# Patient Record
Sex: Male | Born: 1965 | Race: Black or African American | Hispanic: No | Smoking: Current every day smoker
Health system: Southern US, Community
[De-identification: ages and names within clinical notes are randomized; demographics above are authoritative.]

## PROBLEM LIST (undated history)

## (undated) ENCOUNTER — Emergency Department (HOSPITAL_COMMUNITY): Admission: EM | Source: Home / Self Care

## (undated) DIAGNOSIS — E119 Type 2 diabetes mellitus without complications: Secondary | ICD-10-CM

## (undated) DIAGNOSIS — F419 Anxiety disorder, unspecified: Secondary | ICD-10-CM

## (undated) DIAGNOSIS — Z8719 Personal history of other diseases of the digestive system: Secondary | ICD-10-CM

## (undated) DIAGNOSIS — G43909 Migraine, unspecified, not intractable, without status migrainosus: Secondary | ICD-10-CM

## (undated) DIAGNOSIS — I509 Heart failure, unspecified: Secondary | ICD-10-CM

## (undated) DIAGNOSIS — Z8711 Personal history of peptic ulcer disease: Secondary | ICD-10-CM

## (undated) DIAGNOSIS — F319 Bipolar disorder, unspecified: Secondary | ICD-10-CM

## (undated) DIAGNOSIS — F329 Major depressive disorder, single episode, unspecified: Secondary | ICD-10-CM

## (undated) DIAGNOSIS — G4730 Sleep apnea, unspecified: Secondary | ICD-10-CM

## (undated) DIAGNOSIS — K219 Gastro-esophageal reflux disease without esophagitis: Secondary | ICD-10-CM

## (undated) DIAGNOSIS — F32A Depression, unspecified: Secondary | ICD-10-CM

## (undated) DIAGNOSIS — Z9289 Personal history of other medical treatment: Secondary | ICD-10-CM

## (undated) DIAGNOSIS — K625 Hemorrhage of anus and rectum: Secondary | ICD-10-CM

## (undated) DIAGNOSIS — M1990 Unspecified osteoarthritis, unspecified site: Secondary | ICD-10-CM

## (undated) DIAGNOSIS — F99 Mental disorder, not otherwise specified: Secondary | ICD-10-CM

## (undated) DIAGNOSIS — R51 Headache: Secondary | ICD-10-CM

## (undated) DIAGNOSIS — K859 Acute pancreatitis without necrosis or infection, unspecified: Secondary | ICD-10-CM

## (undated) DIAGNOSIS — I10 Essential (primary) hypertension: Secondary | ICD-10-CM

## (undated) DIAGNOSIS — K922 Gastrointestinal hemorrhage, unspecified: Secondary | ICD-10-CM

## (undated) DIAGNOSIS — F10239 Alcohol dependence with withdrawal, unspecified: Secondary | ICD-10-CM

## (undated) HISTORY — PX: CARDIAC CATHETERIZATION: SHX172

## (undated) HISTORY — PX: NASAL POLYP EXCISION: SHX2068

## (undated) HISTORY — PX: COLONOSCOPY: SHX174

## (undated) HISTORY — PX: FRACTURE SURGERY: SHX138

---

## 1986-10-20 HISTORY — PX: EYE SURGERY: SHX253

## 1987-09-20 HISTORY — PX: ELBOW FRACTURE SURGERY: SHX616

## 1998-09-04 ENCOUNTER — Emergency Department (HOSPITAL_COMMUNITY): Admission: EM | Admit: 1998-09-04 | Discharge: 1998-09-04 | Payer: Self-pay | Admitting: Internal Medicine

## 1998-09-04 ENCOUNTER — Encounter: Payer: Self-pay | Admitting: Internal Medicine

## 1998-10-03 ENCOUNTER — Encounter: Payer: Self-pay | Admitting: Emergency Medicine

## 1998-10-03 ENCOUNTER — Emergency Department (HOSPITAL_COMMUNITY): Admission: EM | Admit: 1998-10-03 | Discharge: 1998-10-03 | Payer: Self-pay | Admitting: Emergency Medicine

## 1998-11-14 ENCOUNTER — Emergency Department (HOSPITAL_COMMUNITY): Admission: EM | Admit: 1998-11-14 | Discharge: 1998-11-14 | Payer: Self-pay | Admitting: Emergency Medicine

## 1999-11-21 HISTORY — PX: DIGITAL NERVE REPAIR: SHX1458

## 1999-11-26 ENCOUNTER — Emergency Department (HOSPITAL_COMMUNITY): Admission: EM | Admit: 1999-11-26 | Discharge: 1999-11-26 | Payer: Self-pay | Admitting: Emergency Medicine

## 1999-11-28 ENCOUNTER — Ambulatory Visit (HOSPITAL_BASED_OUTPATIENT_CLINIC_OR_DEPARTMENT_OTHER): Admission: RE | Admit: 1999-11-28 | Discharge: 1999-11-28 | Payer: Self-pay | Admitting: Orthopedic Surgery

## 2000-06-16 ENCOUNTER — Encounter: Payer: Self-pay | Admitting: Emergency Medicine

## 2000-06-16 ENCOUNTER — Inpatient Hospital Stay (HOSPITAL_COMMUNITY): Admission: EM | Admit: 2000-06-16 | Discharge: 2000-06-18 | Payer: Self-pay | Admitting: Emergency Medicine

## 2000-06-17 ENCOUNTER — Encounter: Payer: Self-pay | Admitting: *Deleted

## 2000-08-28 ENCOUNTER — Observation Stay (HOSPITAL_COMMUNITY): Admission: AD | Admit: 2000-08-28 | Discharge: 2000-08-29 | Payer: Self-pay | Admitting: Emergency Medicine

## 2000-08-28 ENCOUNTER — Encounter: Payer: Self-pay | Admitting: Emergency Medicine

## 2000-08-29 ENCOUNTER — Encounter: Payer: Self-pay | Admitting: Emergency Medicine

## 2001-02-17 ENCOUNTER — Emergency Department (HOSPITAL_COMMUNITY): Admission: EM | Admit: 2001-02-17 | Discharge: 2001-02-17 | Payer: Self-pay | Admitting: Emergency Medicine

## 2001-02-17 ENCOUNTER — Encounter: Payer: Self-pay | Admitting: Emergency Medicine

## 2001-08-22 ENCOUNTER — Emergency Department (HOSPITAL_COMMUNITY): Admission: EM | Admit: 2001-08-22 | Discharge: 2001-08-22 | Payer: Self-pay | Admitting: *Deleted

## 2001-11-22 ENCOUNTER — Emergency Department (HOSPITAL_COMMUNITY): Admission: EM | Admit: 2001-11-22 | Discharge: 2001-11-22 | Payer: Self-pay | Admitting: Emergency Medicine

## 2001-11-22 ENCOUNTER — Encounter: Payer: Self-pay | Admitting: Emergency Medicine

## 2002-03-22 ENCOUNTER — Emergency Department (HOSPITAL_COMMUNITY): Admission: EM | Admit: 2002-03-22 | Discharge: 2002-03-22 | Payer: Self-pay | Admitting: *Deleted

## 2002-06-28 ENCOUNTER — Encounter: Payer: Self-pay | Admitting: Emergency Medicine

## 2002-06-29 ENCOUNTER — Inpatient Hospital Stay (HOSPITAL_COMMUNITY): Admission: EM | Admit: 2002-06-29 | Discharge: 2002-07-01 | Payer: Self-pay | Admitting: Emergency Medicine

## 2002-06-30 ENCOUNTER — Encounter: Payer: Self-pay | Admitting: *Deleted

## 2002-07-02 ENCOUNTER — Emergency Department (HOSPITAL_COMMUNITY): Admission: EM | Admit: 2002-07-02 | Discharge: 2002-07-02 | Payer: Self-pay | Admitting: Emergency Medicine

## 2002-07-07 ENCOUNTER — Emergency Department (HOSPITAL_COMMUNITY): Admission: EM | Admit: 2002-07-07 | Discharge: 2002-07-07 | Payer: Self-pay | Admitting: *Deleted

## 2002-07-16 ENCOUNTER — Emergency Department (HOSPITAL_COMMUNITY): Admission: EM | Admit: 2002-07-16 | Discharge: 2002-07-16 | Payer: Self-pay | Admitting: Emergency Medicine

## 2002-10-25 ENCOUNTER — Emergency Department (HOSPITAL_COMMUNITY): Admission: EM | Admit: 2002-10-25 | Discharge: 2002-10-25 | Payer: Self-pay | Admitting: Emergency Medicine

## 2003-01-20 ENCOUNTER — Emergency Department (HOSPITAL_COMMUNITY): Admission: EM | Admit: 2003-01-20 | Discharge: 2003-01-20 | Payer: Self-pay | Admitting: Emergency Medicine

## 2003-01-20 ENCOUNTER — Encounter: Payer: Self-pay | Admitting: Emergency Medicine

## 2003-01-27 ENCOUNTER — Emergency Department (HOSPITAL_COMMUNITY): Admission: EM | Admit: 2003-01-27 | Discharge: 2003-01-27 | Payer: Self-pay | Admitting: Emergency Medicine

## 2003-09-16 ENCOUNTER — Emergency Department (HOSPITAL_COMMUNITY): Admission: EM | Admit: 2003-09-16 | Discharge: 2003-09-17 | Payer: Self-pay | Admitting: Emergency Medicine

## 2003-09-28 ENCOUNTER — Emergency Department (HOSPITAL_COMMUNITY): Admission: EM | Admit: 2003-09-28 | Discharge: 2003-09-28 | Payer: Self-pay | Admitting: Emergency Medicine

## 2003-10-01 ENCOUNTER — Emergency Department (HOSPITAL_COMMUNITY): Admission: EM | Admit: 2003-10-01 | Discharge: 2003-10-01 | Payer: Self-pay | Admitting: Emergency Medicine

## 2004-08-29 ENCOUNTER — Emergency Department (HOSPITAL_COMMUNITY): Admission: EM | Admit: 2004-08-29 | Discharge: 2004-08-29 | Payer: Self-pay | Admitting: Family Medicine

## 2004-10-04 ENCOUNTER — Emergency Department (HOSPITAL_COMMUNITY): Admission: EM | Admit: 2004-10-04 | Discharge: 2004-10-04 | Payer: Self-pay | Admitting: Family Medicine

## 2004-11-12 ENCOUNTER — Emergency Department (HOSPITAL_COMMUNITY): Admission: EM | Admit: 2004-11-12 | Discharge: 2004-11-12 | Payer: Self-pay | Admitting: Family Medicine

## 2004-12-08 ENCOUNTER — Emergency Department (HOSPITAL_COMMUNITY): Admission: EM | Admit: 2004-12-08 | Discharge: 2004-12-08 | Payer: Self-pay | Admitting: Emergency Medicine

## 2005-03-08 ENCOUNTER — Emergency Department (HOSPITAL_COMMUNITY): Admission: EM | Admit: 2005-03-08 | Discharge: 2005-03-09 | Payer: Self-pay | Admitting: Emergency Medicine

## 2005-03-27 ENCOUNTER — Emergency Department (HOSPITAL_COMMUNITY): Admission: EM | Admit: 2005-03-27 | Discharge: 2005-03-27 | Payer: Self-pay | Admitting: Emergency Medicine

## 2005-04-28 ENCOUNTER — Emergency Department (HOSPITAL_COMMUNITY): Admission: EM | Admit: 2005-04-28 | Discharge: 2005-04-28 | Payer: Self-pay | Admitting: Emergency Medicine

## 2005-07-20 ENCOUNTER — Emergency Department (HOSPITAL_COMMUNITY): Admission: EM | Admit: 2005-07-20 | Discharge: 2005-07-20 | Payer: Self-pay | Admitting: Emergency Medicine

## 2005-08-19 ENCOUNTER — Emergency Department (HOSPITAL_COMMUNITY): Admission: EM | Admit: 2005-08-19 | Discharge: 2005-08-19 | Payer: Self-pay | Admitting: Emergency Medicine

## 2005-09-23 ENCOUNTER — Emergency Department (HOSPITAL_COMMUNITY): Admission: EM | Admit: 2005-09-23 | Discharge: 2005-09-23 | Payer: Self-pay | Admitting: Emergency Medicine

## 2006-03-13 ENCOUNTER — Emergency Department (HOSPITAL_COMMUNITY): Admission: EM | Admit: 2006-03-13 | Discharge: 2006-03-13 | Payer: Self-pay | Admitting: Family Medicine

## 2006-03-14 ENCOUNTER — Emergency Department (HOSPITAL_COMMUNITY): Admission: EM | Admit: 2006-03-14 | Discharge: 2006-03-15 | Payer: Self-pay | Admitting: Emergency Medicine

## 2006-04-07 ENCOUNTER — Emergency Department (HOSPITAL_COMMUNITY): Admission: EM | Admit: 2006-04-07 | Discharge: 2006-04-07 | Payer: Self-pay | Admitting: Emergency Medicine

## 2006-04-29 ENCOUNTER — Emergency Department (HOSPITAL_COMMUNITY): Admission: EM | Admit: 2006-04-29 | Discharge: 2006-04-29 | Payer: Self-pay | Admitting: Family Medicine

## 2006-06-20 HISTORY — PX: CIRCUMCISION: SUR203

## 2006-06-26 ENCOUNTER — Ambulatory Visit (HOSPITAL_BASED_OUTPATIENT_CLINIC_OR_DEPARTMENT_OTHER): Admission: RE | Admit: 2006-06-26 | Discharge: 2006-06-26 | Payer: Self-pay | Admitting: Urology

## 2006-08-12 ENCOUNTER — Emergency Department (HOSPITAL_COMMUNITY): Admission: EM | Admit: 2006-08-12 | Discharge: 2006-08-12 | Payer: Self-pay | Admitting: Emergency Medicine

## 2006-12-03 ENCOUNTER — Emergency Department (HOSPITAL_COMMUNITY): Admission: EM | Admit: 2006-12-03 | Discharge: 2006-12-03 | Payer: Self-pay | Admitting: Emergency Medicine

## 2007-03-28 ENCOUNTER — Emergency Department (HOSPITAL_COMMUNITY): Admission: EM | Admit: 2007-03-28 | Discharge: 2007-03-28 | Payer: Self-pay | Admitting: Emergency Medicine

## 2009-03-21 ENCOUNTER — Emergency Department (HOSPITAL_COMMUNITY): Admission: EM | Admit: 2009-03-21 | Discharge: 2009-03-21 | Payer: Self-pay | Admitting: Emergency Medicine

## 2009-07-29 ENCOUNTER — Emergency Department (HOSPITAL_COMMUNITY): Admission: EM | Admit: 2009-07-29 | Discharge: 2009-07-29 | Payer: Self-pay | Admitting: Emergency Medicine

## 2009-11-17 ENCOUNTER — Emergency Department (HOSPITAL_COMMUNITY): Admission: EM | Admit: 2009-11-17 | Discharge: 2009-11-18 | Payer: Self-pay | Admitting: Emergency Medicine

## 2010-02-13 ENCOUNTER — Emergency Department (HOSPITAL_COMMUNITY): Admission: EM | Admit: 2010-02-13 | Discharge: 2010-02-13 | Payer: Self-pay | Admitting: Family Medicine

## 2010-05-14 ENCOUNTER — Emergency Department (HOSPITAL_COMMUNITY): Admission: EM | Admit: 2010-05-14 | Discharge: 2010-05-14 | Payer: Self-pay | Admitting: Emergency Medicine

## 2011-01-06 LAB — COMPREHENSIVE METABOLIC PANEL
ALT: 25 U/L (ref 0–53)
AST: 33 U/L (ref 0–37)
Albumin: 4.1 g/dL (ref 3.5–5.2)
Alkaline Phosphatase: 57 U/L (ref 39–117)
BUN: 10 mg/dL (ref 6–23)
CO2: 24 mEq/L (ref 19–32)
Calcium: 8.4 mg/dL (ref 8.4–10.5)
Chloride: 97 mEq/L (ref 96–112)
Creatinine, Ser: 0.96 mg/dL (ref 0.4–1.5)
GFR calc Af Amer: >60 mL/min (ref >60)
GFR calc non Af Amer: >60 mL/min (ref >60)
Glucose, Bld: 99 mg/dL (ref 70–99)
Potassium: 2.8 mEq/L — ABNORMAL LOW (ref 3.5–5.1)
Sodium: 130 mEq/L — ABNORMAL LOW (ref 135–145)
Total Bilirubin: 0.8 mg/dL (ref 0.3–1.2)
Total Protein: 7.6 g/dL (ref 6.0–8.3)

## 2011-01-06 LAB — CBC
HCT: 44.3 % (ref 39.0–52.0)
Hemoglobin: 15.3 g/dL (ref 13.0–17.0)
MCHC: 34.4 g/dL (ref 30.0–36.0)
MCV: 98.5 fL (ref 78.0–100.0)
Platelets: 185 10*3/uL (ref 150–400)
RBC: 4.5 MIL/uL (ref 4.22–5.81)
RDW: 13.1 % (ref 11.5–15.5)
WBC: 6.1 10*3/uL (ref 4.0–10.5)

## 2011-01-06 LAB — URINALYSIS, ROUTINE W REFLEX MICROSCOPIC
Bilirubin Urine: NEGATIVE
Glucose, UA: NEGATIVE mg/dL
Hgb urine dipstick: NEGATIVE
Ketones, ur: NEGATIVE mg/dL
Nitrite: NEGATIVE
Protein, ur: NEGATIVE mg/dL
Specific Gravity, Urine: 1.019 (ref 1.005–1.030)
Urobilinogen, UA: 0.2 mg/dL (ref 0.0–1.0)
pH: 5.5 (ref 5.0–8.0)

## 2011-01-06 LAB — DIFFERENTIAL
Basophils Absolute: 0 10*3/uL (ref 0.0–0.1)
Basophils Relative: 0 % (ref 0–1)
Eosinophils Absolute: 0 10*3/uL (ref 0.0–0.7)
Eosinophils Relative: 1 % (ref 0–5)
Lymphocytes Relative: 9 % — ABNORMAL LOW (ref 12–46)
Lymphs Abs: 0.5 10*3/uL — ABNORMAL LOW (ref 0.7–4.0)
Monocytes Absolute: 0.3 10*3/uL (ref 0.1–1.0)
Monocytes Relative: 6 % (ref 3–12)
Neutro Abs: 5.2 10*3/uL (ref 1.7–7.7)
Neutrophils Relative %: 85 % — ABNORMAL HIGH (ref 43–77)

## 2011-01-06 LAB — POCT CARDIAC MARKERS
CKMB, poc: 1.4 ng/mL (ref 1.0–8.0)
CKMB, poc: 1.6 ng/mL (ref 1.0–8.0)
Myoglobin, poc: 57.9 ng/mL (ref 12–200)
Myoglobin, poc: 76.3 ng/mL (ref 12–200)
Troponin i, poc: <0.05 ng/mL (ref 0.00–0.09)
Troponin i, poc: <0.05 ng/mL (ref 0.00–0.09)

## 2011-01-06 LAB — URINE CULTURE
Colony Count: NO GROWTH
Culture: NO GROWTH

## 2011-01-06 LAB — LIPASE, BLOOD: Lipase: 33 U/L (ref 11–59)

## 2011-01-06 LAB — LACTIC ACID, PLASMA: Lactic Acid, Venous: 1.8 mmol/L (ref 0.5–2.2)

## 2011-01-06 LAB — APTT: aPTT: 34 seconds (ref 24–37)

## 2011-01-06 LAB — PROTIME-INR
INR: 0.91 (ref 0.00–1.49)
Prothrombin Time: 12.2 seconds (ref 11.6–15.2)

## 2011-01-06 LAB — HEMOCCULT GUIAC POC 1CARD (OFFICE): Fecal Occult Bld: POSITIVE

## 2011-01-24 LAB — RAPID URINE DRUG SCREEN, HOSP PERFORMED
Barbiturates: NOT DETECTED
Opiates: NOT DETECTED

## 2011-01-24 LAB — CBC
HCT: 43.7 % (ref 39.0–52.0)
Hemoglobin: 15.1 g/dL (ref 13.0–17.0)
MCHC: 34.6 g/dL (ref 30.0–36.0)
MCV: 98.5 fL (ref 78.0–100.0)
Platelets: 231 10*3/uL (ref 150–400)
RBC: 4.43 MIL/uL (ref 4.22–5.81)
RDW: 12.8 % (ref 11.5–15.5)
WBC: 3 10*3/uL — ABNORMAL LOW (ref 4.0–10.5)

## 2011-01-24 LAB — COMPREHENSIVE METABOLIC PANEL
ALT: 35 U/L (ref 0–53)
AST: 64 U/L — ABNORMAL HIGH (ref 0–37)
Albumin: 4.3 g/dL (ref 3.5–5.2)
Alkaline Phosphatase: 59 U/L (ref 39–117)
BUN: 7 mg/dL (ref 6–23)
CO2: 25 mEq/L (ref 19–32)
Calcium: 9 mg/dL (ref 8.4–10.5)
Chloride: 102 mEq/L (ref 96–112)
Creatinine, Ser: 0.85 mg/dL (ref 0.4–1.5)
GFR calc Af Amer: >60 mL/min (ref >60)
GFR calc non Af Amer: >60 mL/min (ref >60)
Glucose, Bld: 104 mg/dL — ABNORMAL HIGH (ref 70–99)
Potassium: 3.7 mEq/L (ref 3.5–5.1)
Sodium: 137 mEq/L (ref 135–145)
Total Bilirubin: 0.7 mg/dL (ref 0.3–1.2)
Total Protein: 7.3 g/dL (ref 6.0–8.3)

## 2011-01-24 LAB — DIFFERENTIAL
Basophils Absolute: 0 10*3/uL (ref 0.0–0.1)
Basophils Relative: 1 % (ref 0–1)
Eosinophils Absolute: 0.1 10*3/uL (ref 0.0–0.7)
Eosinophils Relative: 2 % (ref 0–5)
Monocytes Absolute: 0.4 10*3/uL (ref 0.1–1.0)
Neutro Abs: 1.4 10*3/uL — ABNORMAL LOW (ref 1.7–7.7)

## 2011-01-24 LAB — POCT CARDIAC MARKERS
CKMB, poc: 2.6 ng/mL (ref 1.0–8.0)
CKMB, poc: 2.7 ng/mL (ref 1.0–8.0)
Myoglobin, poc: 81.4 ng/mL (ref 12–200)
Troponin i, poc: <0.05 ng/mL (ref 0.00–0.09)
Troponin i, poc: <0.05 ng/mL (ref 0.00–0.09)

## 2011-03-07 NOTE — H&P (Signed)
NAMESABASTION, DEAN NO.:  [id number]   MEDICAL RECORD NO.:  [id number]                   PATIENT TYPE:  INP   LOCATION:  0344                                 FACILITY:  [facility]   PHYSICIAN:  Colleen Can. Deborah Chalk, M.D.            DATE OF BIRTH:  April 06, 1966   DATE OF ADMISSION:  06/28/2002  DATE OF DISCHARGE:  06/29/2002                                HISTORY & PHYSICAL   CHIEF COMPLAINT:  Presyncope.   HISTORY OF PRESENT ILLNESS:  The patient is a 45 year old black male who was  admitted to the rule out MI unit at [facility] with an 8- to 10-  hour history of buzzing in his hands that was associated with a feeling of  palpitations, lightheadedness and a subsequent choking sensation.  It  basically occurred at work and worsened while he was driving.  There was no  frank syncope.  He proceeded on to the emergency room last evening, where  his EKG was noted to be abnormal.  He was subsequently admitted for further  evaluation.   PAST MEDICAL HISTORY:  1. Previous history of chest tightness with an abnormal Cardiolite study     which led to elective cardiac catheterization showing normal coronaries.     It was felt that he had had a false-positive graded exercise test.  2. History of migraines.  3. Past cocaine use.  4. Previous right arm numbness with negative carotid Duplex in April of     2000.  5. History of increased blood pressure.   ALLERGIES:  None.   CURRENT MEDICINES:  He denies any prescription medicines but does note that  he takes a fair amount of Goody's powders for migraines.   FAMILY HISTORY:  His mother has had hypertension and migraines as well as  some type of heart problem in her late 66s.  Father died perhaps of cerebral  aneurysm.   SOCIAL HISTORY:  He was previously married and states that he had seven  children; he is currently separated.  He continues to smoke one pack of  cigarettes a day.  He denies any  alcohol use.  He denies current cocaine use  but does have a past history of cocaine abuse.  He is employed in the  information systems integration.   REVIEW OF SYSTEMS:  Review of systems is basically as noted above and is  otherwise unremarkable.   PHYSICAL EXAMINATION:  GENERAL:  He is in no acute distress.  His mood is  somewhat withdrawn.  VITAL SIGNS:  Blood pressure 143/71.  Heart rate is in the 70s.  He is  afebrile.  Weight is 220 pounds.  SKIN:  Skin is warm and dry.  Color is unremarkable.  NECK:  Neck supple.  No JVD.  LUNGS:  Clear.  HEART:  Regular rhythm.  No murmur.  ABDOMEN:  Abdomen is soft.  Positive bowel sounds.  EXTREMITIES:  Extremities are without edema.  NEUROLOGIC:  Neurologic appears to be intact with no gross focal deficits.   LABORATORY AND ACCESSORY CLINICAL DATA:  Troponin I's are negative x2.  Cardiac enzymes show a CK total of 596 with MB of 4.5.  CBC is normal.  Chemistries were normal except for a potassium of 3.0.   EKG showed diffuse T wave changes.  From his last admission in 2001, he did  have ST depression and T wave inversion in the inferior leads.   OVERALL IMPRESSION:  1. Palpitations/presyncope.  2. Previous history of a negative cardiac catheterization following an     abnormal stress test in August of 2001.  3. Prior cocaine use.  4. Elevated blood pressure.   PLAN:  He has already had his potassium replaced.  Followup electrolytes are  pending.  We will continue to check cardiac enzymes, a drug screen will be  order and further plan to occur per Dr. Lindell Spar discretion.       Juanell Fairly C. Earl Gala, N.P.                 Colleen Can. Deborah Chalk, M.D.    LCO/MEDQ  D:  06/29/2002  T:  06/30/2002  Job:  [id number]   cc:   Myriam Jacobson A. Fraser Din, M.D.  [address]  [place]  [place] [address]  Fax: [phone]   Darius Bump, M.D.

## 2011-03-07 NOTE — H&P (Signed)
[facility]  Patient:    Jacob Adams, Jacob Adams                          MRN: [id number] Proc. Date: 06/16/00 Adm. Date:  [id number] Attending:  Nat Christen CC:         Leilani Merl, M.D.   History and Physical  CHIEF COMPLAINT: Chest tightness.  HISTORY OF PRESENT ILLNESS: This patient is a 45 year old black male who presents with chest tightness.  Today between 9 and 10 a.m. after dropping her daughter off at school he went home and while sitting at home began feeling weak in his arms and legs.  He felt hot all over his body.  This was followed by a feeling of retrosternal chest tightness radiating up into the bilateral anterior neck.  There was no shortness of breath or diaphoresis.  He felt light headed.  He was brought to [facility] Emergency Room by his family, where he was found to have an abnormal EKG.  Over the last two weeks he has had up to three to four episodes of similar symptoms a day, generally occurring at rest and characterized by chest tightness.  These lasted usually less than five minutes.  Todays episode lasted for one hour.  He has had occasional heartburn relieved by Tums over the last several weeks, maybe once or twice a week.  He has been under a great deal of stress related to multiple jobs he is working on (he is self-employed in Mining engineer), and also stress at home with his wife and children (wife expecting a child).  His cardiac risk factors include tobacco use, question of coronary artery disease in his mother, and a history of elevated blood pressure never treated.  Lipids are unknown.  He denies any illegal drug use since 1994.  PAST MEDICAL HISTORY:  1. Migraine headaches.  2. History of elevated blood pressure.  3. Right arm numbness in April 2000, with carotid ultrasound showing no     significant stenosis.  4. History of cocaine abuse.  Denies any since 1994.  PAST SURGICAL  HISTORY:  1. Left arm surgery after an accident.  2. ENT surgery for epistaxis.  ALLERGIES: No known drug allergies.  CURRENT MEDICATIONS: None.  FAMILY HISTORY: Mother with hypertension, migraine headaches, and some kind of heart problem at age 34.  Father died, possibly of cerebral aneurysm.  Sister with gestational diabetes.  SOCIAL HISTORY: He is married and has six children, expecting the seventh.  He is self-employed in Statistician.  Tobacco use of one pack per day for 19 years, once quit for up to a year.  Alcohol, none.  Cocaine use as above.  Denies any illegal drug use in the last seven years.  REVIEW OF SYSTEMS: Mild frontal headache over the last four days.  PHYSICAL EXAMINATION:  GENERAL: Well-appearing black male.  VITAL SIGNS: Blood pressure 120/95, heart rate 85, respirations 18, temperature 97.9 degrees.  Oxygen saturation 98% on room air.  HEENT: PERRL.  EOMI.  Funduscopic examination normal.  TMs clear.  Nose normal.  Oropharynx clear without erythema or exudate.  NECK: No carotid bruits.  No adenopathy or thyromegaly.  LUNGS: Clear.  HEART: Regular rate and rhythm without murmurs, rubs, or gallops.  ABDOMEN: Soft, nontender.  No masses or hepatosplenomegaly.  GU/RECTAL: Deferred.  EXTREMITIES: No edema, with normal peripheral pulses.  NEUROLOGIC: Cranial nerves 2-12 intact.  Strength 5/5  in all extremities. Reflexes 2/4 throughout.  LABORATORY DATA: CBC shows a WBC of 4.3, hemoglobin 15.4, platelet count 235,000.  Urinalysis completely normal.  Urine drug screen negative.  Troponin 0.06.  CPK 276 with MB 3.9.  Complete metabolic profile shows a sodium of 137, potassium 3.7, chloride 111, bicarbonate 19, glucose 156, BUN 9, creatinine 1.1.  Calcium 9.1, total protein 6.9, albumin 3.8, SGOT 27, SGPT 21, alkaline phosphatase 43, total bilirubin 0.6.  Chest x-ray shows no acute disease.  EKG shows junctional rhythm with a rate of  78 and downsloping ST depression and T wave inversion in the inferior leads (no old EKG is available).  ASSESSMENT:  1. Atypical chest pain, rule out cardiac ischemia; unlikely at this age.     Possibly secondary to anxiety or panic.  2. Abnormal electrocardiogram.  3. Elevated blood pressure.  4. Elevated glucose.  PLAN: Observation.  Aspirin.  Lovenox.  Nitroglycerin IV for any further chest pain.  Serial cardiac enzymes.  Follow blood pressure.  Fasting glucose and lipids.  Nuclear stress test tomorrow.   DD:  06/16/00 TD:  06/16/00 Job: [id number]

## 2011-03-07 NOTE — Op Note (Signed)
NAME:  Jacob Adams, Jacob Adams NO.:  [id number]   MEDICAL RECORD NO.:  [id number]          PATIENT TYPE:  AMB   LOCATION:  NESC                         FACILITY:  [facility]   PHYSICIAN:  Hanley Ben, M.D.  DATE OF BIRTH:  Jan 17, 1966   DATE OF PROCEDURE:  06/26/2006  DATE OF DISCHARGE:                                 OPERATIVE REPORT   PREOPERATIVE DIAGNOSIS:  Phimosis.   POSTOPERATIVE DIAGNOSIS:  Phimosis.   PROCEDURE:  Circumcision.   SURGEON:  Dr. Janice Norrie.   ANESTHESIA:  General.   INDICATIONS:  The patient is a 45 year old male who has been complaining of  difficulty retracting his foreskin.  He was found on physical examination to  have phimosis.  He also has a papular lesion at the base of the penis.  He  is scheduled for circumcision.   Under general anesthesia, the patient was prepped and draped and placed in  the supine position.  Two circumferential incisions were made on the  foreskin and the foreskin in between those two incisions was excised.  Hemostasis was secured with electrocautery.  Skin approximation was then  done with #4-0 chromic.  Then, the papular lesion at the base of the penis  was fulgurated.  A penile block was then done with 0.25% Marcaine.   The patient tolerated the procedure well and left the OR in satisfactory  condition to post anesthesia care unit.      Hanley Ben, M.D.  Electronically Signed     MN/MEDQ  D:  06/26/2006  T:  06/26/2006  Job:  [id number]

## 2011-03-07 NOTE — Discharge Summary (Signed)
NAME:  Jacob Adams, Jacob Adams NO.:  [id number]   MEDICAL RECORD NO.:  [id number]                   PATIENT TYPE:  INP   LOCATION:  4736                                 FACILITY:  [facility]   PHYSICIAN:  Meade Maw, MD                   DATE OF BIRTH:  1966/08/13   DATE OF ADMISSION:  06/29/2002  DATE OF DISCHARGE:  07/01/2002                                 DISCHARGE SUMMARY   PROCEDURES:  1. Stress Cardiolite, revealing changes consistent with small area of stress     induced ischemia in the anterior apical and inferior apical walls with     possible underlying scar.  EF 54%.  2. (07/01/02)  Cardiac catheterization by Dr. Meade Maw revealing normal     coronary arteries, evidence of apical hypertrophy which may be the     culprit for the abnormal Cardiolite exam above.   DISCHARGE DIAGNOSES/HOSPITAL COURSE:  1. Atypical chest discomfort:  A 45 year old gentleman with history of prior     cardiac evaluation:  Cardiac catheterization 08/01, which was negative     for coronary artery disease and normal LV functio.  This was done as a     follow up to her abnormal Cardiolite at that time.  The patient was     admitted with complaint of 8-10 hour barfing in his hands feeling of     palpitations, lightheadedness, and subsequent choking sensation.  There     is no frank syncope, though he felt lightheaded.  He felt some anterior     chest pressure and facial pressure as well.  Presented to the emergency     room where EKG revealed T-wave abnormalities (biphasic) inferior leads     with ST coving V3 and V6.  This was in the setting of serum potassium of     3.0.  His EKG improved with normalization of the serum potassium to 3.4     and then 2.8 with supplements.  He underwent a stress Cardiolite which,     during exercise (12 minutes into Standard Bruce) was negative for     significant EKG changes and no ____________ of symptoms.  However, follow     up  Cardiolite images were suspicious for a small amount of apical defect     with ischemia.  Preserved ejection fraction.  In view of abnormal EKG and     abnormal Cardiolite the patient was advised to undergo coronary     angiography.  This was accomplished on 07/01/02.  Coronary angiography     revealed normal coronary arteries but evidence of apical hypertrophy     which were probably the explanation for the abnormal Cardiolite results.     The patient was discharged home in stable condition.   1. History of migraines:  Improvement with amitriptyline in the past.  He  will be given 1 month prescription for amitriptyline 25 mg p.o. q.h.s.     with plans for followup with primary care Gimena Buick (to establish) of note     patient.   1. History of past cocaine use.  Denied any recent abuse and indeed his     urine drug screen was negative.   1. History of previous right arm numbness with negative duplex in 04/00.   1. History of hypertension.   PAST SURGICAL HISTORY:  Left arm surgery after an accident.  ENT surgery for  epistaxis.   PLAN:  The patient was discharged home in stable condition.   DISCHARGE MEDICATIONS:  1. P.r.n. Goody's powder for headache.  2. Amitriptyline 25 mg one p.o. q.h.s. p.r.n. one month prescription     provided with no refill.   LABORATORY DATA:  Laboratory tests today WBC of 4.1, hemoglobin 14.4,  hematocrit 42.2, platelets of 225,000.  Admission coags:  Protime 14.2, INR  of 1.1, PTT of 47, serum sodium 140, admission K 3.0 supplement and was 3.8  at time of discharge, serum chloride of 112, CO2 22, glucose 101, BUN 5,  creatinine 0.9, LFTs all within normal range.  First CK 596, MB fraction  4.5, relative index 0.8.  Troponin I less than 0.01.  Second troponin I  0.01, CK of 395, MB fraction 2.8,  with relative index 0.7, troponin was  less than 0.01.  Cholesterols were followed with cholesterol of 166,  triglycerides 70, HDL 42, LDL of 110.  Urine  drug screen was negative.  Admission chest x-ray revealed no active lung disease, mild cardiomegaly.      Salomon Fick, NP                         Meade Maw, MD    MES/MEDQ  D:  07/01/2002  T:  07/04/2002  Job:  [id number]

## 2011-03-07 NOTE — Op Note (Signed)
[place]  Patient:    Jacob Adams, Jacob Adams                          MRN: [id number] Proc. Date: 11/28/99 Adm. Date:  [id number] Disc. Date: [id number] Attending:  Sheran Luz CC:         Wynonia Sours, M.D. (2)                           Operative Report  PREOPERATIVE DIAGNOSIS:  Laceration left ring finger.  POSTOPERATIVE DIAGNOSIS:  Laceration left ring finger.  OPERATION:  Exploration digital nerve, repair of digital artery flexor sheath left ring.  SURGEON:  Wynonia Sours, M.D.  ASSISTANT:  R.N.  ANESTHESIA:  Forearm based IV regional.  anesthesiologist:  Arnette Schaumann. Lutricia Feil, M.D.  BRIEF HISTORY:  The patient is a 45 year old male who suffered a laceration to is left ring finger while attempting to separate frozen meat.  PROCEDURE:  The patient was brought to the operating room where a forearm based IV regional anesthetic was carried out without difficulty.  He was prepped and draped using Betadine scrub and solution with the left arm free.  The sutures were removed.  The wound was extended proximally and distally and carried down through subcutaneous tissue.  A partial laceration to the flexor sheath was identified ith 2 small nicks in the superficialis profundus tendon less than 10%.  There was no significant disruption.  A ______ repair was performed.  The flexor sheath was repaired with a running 6-0 Mersilene suture.  The digital nerve was found to be intact.  The digital artery was found to be lacerated.  The operative microscope was brought into position.  The ends of the artery cut back to normal intima, irrigated, dilated, and a repair performed with a backwall purse technique with interrupted 9-0 nylon sutures.  The wound was irrigated, the skin closed with interrupted 5-0 nylon sutures.  Sterile compressive dressing and splint was applied.  The patient tolerated the procedure well and as taken to the recovery room  for observation in satisfactory condition.  He is discharged home to return to [facility] in [place] in 1 week on Vicodin and Keflex. DD:  11/28/99 TD:  11/28/99 Job: [id number]

## 2011-03-07 NOTE — Cardiovascular Report (Signed)
[place]. [facility]  Patient:    Jacob Adams, Jacob Adams                          MRN: [id number] Proc. Date: 06/18/00 Adm. Date:  [id number] Attending:  Mack Hook                        Cardiac Catheterization  PROCEDURE PERFORMED:  Left heart catheterization, coronary angiography, single-plane ventriculogram.  INDICATION FOR PROCEDURE:  Reversible ischemia on Cardiolite, associated with chest pain.  DESCRIPTION OF PROCEDURE:  After obtaining written informed consent, the patient was brought to the cardiac catheterization lab in the post-absorptive state.  Preop sedation was achieved using IV Versed.  The right groin was prepped and draped in the usual sterile fashion.  Local anesthesia was achieved using 1% Xylocaine.  A 6-French hemostasis sheath was placed into the right femoral artery using a modified Seldinger technique.  Selective coronary angiography was performed using the JL4 and JR4 Judkins catheters.  A single-plane ventriculogram was performed in the RAO position using a 6-French pigtail curved catheter.  All catheter exchanges were made over a guidewire and following the procedure, there was no identifiable disease.  The hemostasis sheath was removed and the patient was transferred to the holding area.  FINDINGS:  The aortic pressure was 121/78.  LV pressure was 122/19.  Normal wall motion.  Ejection fraction was 55-60%.  No mitral regurgitation.  CORONARY ANGIOGRAPHY 1. The left main coronary artery bifurcated into the left anterior descending    and circumflex vessel.  There was no significant disease in the left main    coronary artery. 2. The left anterior descending gave rise to a large D-1, small D-2, small D-3    and ended as an apical recurrent.  There was no significant disease in the    left anterior descending. 3. The circumflex vessel gave rise to a small OM-1, moderate OM-2 and large    OM-3.  There was no significant disease  in the circumflex. 4. Right coronary artery:  Right coronary artery is large and dominant and    gave rise to a small RV marginal #1, moderate RV marginal #2 and a PDA    branch. No significant disease in the right coronary artery.  IMPRESSION:  Normal coronaries.  Normal single-plane ventriculogram. False-positive graded exercise test.  RECOMMENDATION:  Recommendation is to consider other etiologies for his chest pain. DD:  06/18/00 TD:  06/18/00 Job: [id number]

## 2011-03-07 NOTE — Discharge Summary (Signed)
[facility]  Patient:    Jacob Adams, Jacob Adams                          MRN: [id number] Adm. Date:  [id number] Disc. Date: [id number] Attending:  Fabio Adams Adams                           Discharge Summary  ADMITTING DIAGNOSES: 1. Atypical chest pain. 2. Abnormal electrocardiogram. 3. Elevated blood pressure. 4. Elevated glucose.  DISCHARGE DIAGNOSES: 1. Anxiety/panic attacks 2. Intermittent junctional rhythm felt secondary to vagal response with    anxiety. 3. Intermittently borderline elevated blood pressure.  CONSULTATIONS:  Jacob Adams, M.D., cardiology, June 18, 2000.  PROCEDURES:  Stress Cardiolite, June 17, 2000, cardiac catheterization, August 30. 2001.  HISTORY OF PRESENT ILLNESS:  This is Adams 45 year old black male who presented to the emergency room on June 16, 2000, with Adams history of developing chest tightness, feeling weak in his arms and legs, and heat all over his body.  He had no associated shortness of breath or diaphoresis but did feel light headed.  Patient was brought to the [facility] emergency room by his family where he was found to have EKG showing inverted T waves in II, III, and aVF, as well as Adams junctional rhythm.  Inferior T waves were noted to be more prominent than an EKG of October 03, 1998.  Patient stated that he had had similar episodes three to four times daily for the past two weeks generally lasting five minutes but, the day of admission, his episode lasted for an hour.  Patient had had occasional heartburn relieved by Tums over the last several weeks, maybe once or twice Adams week.  Patient also had been under Adams great deal of stress related to multiple jobs including self-employment, as well as expecting Adams new child and having other children at home.  Cardiac risk factors included tobacco use, possible family history, and Adams history of elevated blood pressures.  Patient denied any illegal drug use since  1994, apparently with Adams history of cocaine abuse prior to that.  PAST MEDICAL HISTORY:  Significant for right arm numbness in April 2000 with Adams carotid ultrasound showing no significant stenosis.  MEDICATIONS:  Patient was on no current medications.  PHYSICAL EXAMINATION:  VITAL SIGNS:  His blood pressure was 120/95, heart rate 85, respirations 18, temperature 97.9, and oxygen saturation on room air was 98%.  GENERAL:  His physical exam was normal.  LABORATORY DATA:  CBC:  White count 4.3, hemoglobin 15.4, platelets 235,000. Urinalysis was normal.  Urine drug screen was negative.  Troponin was 0.06, CPK was 276, with an MB of 3.9.  Complete metabolic profile showed Adams sodium of 137, potassium 3.7, chloride 111, bicarbonate 19, glucose 156, and BUN 9. Creatinine was 1.1.  Chest x-ray showed no acute disease.  EKG showed the junctional rhythm with rate of 78 and T wave inversion in the inferior leads as noted previously.  HOSPITAL COURSE:  Patient was placed on aspirin, Lovenox, nitroglycerin IV ordered for any further chest discomfort with serial cardiac isoenzymes. With regards to his isoenzymes, CPK remained elevated but did trend downward by the following morning at 246.  His CK-MB, however, continued to remain within normal range.  Troponin I remained in the normal range, as well.  Adams fasting lipid profile the morning of admission showed Adams total cholesterol of 198,  triglycerides 111, HDL 40, and LDL of 136.  He was also placed on Adams low-cholesterol diet.  EKG the following morning showed sinus bradycardia with Adams heart rate of 59 and his T waves inferiorly became upright.  He did have an occasional premature supraventricular beat.  The patient did continue to have intermittent chest tightness with Adams sensation of burning up and feeling generalized weakness over the next two days, sometimes also experiencing Adams choking sensation at least once during one of these episodes.  An EKG  showed recurrence of the junctional rhythm that lasted only minutes.  In fact, they were unable to get another 12-lead showing this. Nuclear stress test was obtained on August 29, results showing Adams small area of reversible anteroapical ischemia.  Jacob Adams was consulted.  She saw him on June 18, 2000, and he subsequently underwent cardiac catheterization which showed normal coronary arteries with ejection fraction of 55 to 60%. Aortic and LV pressures were normal and he had normal wall motion, and the patient was felt to have Adams false positive Cardiolite stress test.  I did have Adams long discussion with Jacob Adams regarding his anxiety and whether some of this could be decreased by changing lifestyle.  I discussed with him that we felt his chest discomfort and related symptoms were secondary to anxiety and possibly panic attacks.  He was given Adams prescription for Ativan to take every 8 hours as needed for the anxiety.  The patients EKG and rhythm changes were felt secondary to Adams vagal response during these episodes of anxiety.  Patient was thus discharged in stable condition on June 18, 2000.  DIET:  Low-cholesterol, 4 gm sodium.  ACTIVITY:  As tolerated.  MEDICATIONS:  Ativan 0.5 mg one to two tablets by mouth every 8 hours as needed for anxiety.  FOLLOW-UP:  Follow up with his primary care physician in our office, Jacob Adams, in one to two weeks for follow-up of borderline blood pressure and possibility of necessity of counseling and chronic medication for his anxiety. He is to call our office if he develops any further problems. DD:  08/06/00 TD:  08/06/00 Job: [id number]

## 2011-03-07 NOTE — Cardiovascular Report (Signed)
NAME:  CLARNECE, DORT NO.:  [id number]   MEDICAL RECORD NO.:  [id number]                   PATIENT TYPE:  INP   LOCATION:  4736                                 FACILITY:  [facility]   PHYSICIAN:  Meade Maw, MD                   DATE OF BIRTH:  1966/03/24   DATE OF PROCEDURE:  DATE OF DISCHARGE:  07/01/2002                              CARDIAC CATHETERIZATION   INDICATIONS FOR PROCEDURE:  Chest pain with reversible ischemia in the  anterior apical lateral region.   DESCRIPTION OF PROCEDURE:  After obtaining written informed consent, the  patient was brought to the cardiac catheterization laboratory in the  postabsorptive state. Preoperative sedation was achieved using IV Versed.  The right groin was prepped and draped in the usual sterile fashion. Local  anesthesia was achieved using 1% Xylocaine. A 6 French hemostasis sheath was  placed into the right femoral artery. The first hemostasis  sheath had a slight defect and had to be exchanged for the second hemostasis  sheath.  Selective coronary angiography was performed using a JL5 and a JR4.  Single plane ventriculogram was performed in the RAO position using a 6  French straight pigtail curved catheter.  All catheter exchange were made  over a guide wire, and hemostasis sheath was placed following each  engagement.   FINDINGS:  The aortic pressure was 128/82, the LV pressure was 140/21. There  was no gradient noted on pullback.  Single plane ventriculogram revealed  apical hypertrophy. The left ventricular was at upper limits of normal.  There is normal systolic function.   CORONARY ANGIOGRAPHY:  Left main coronary artery:  The left main coronary artery has long  bifurcation to the left anterior descending and the circumflex vessel.  There is no disease in the left main coronary artery.   Left anterior descending:  The left anterior descending is a large branch,  gives rise to a very large  diagonal #1, moderate diagonal #2 and goes on to  end as a large apical recurrent branch.  There is no disease in the left  anterior descending or its branches.   Circumflex vessel:  The circumflex vessel gives rise to a large OM-1, small  OM-2, small OM-3, and ends as an AV groove vessel.  There is no disease in  the circumflex or its branches.   Right coronary artery:  The right coronary artery is a large dominant  artery, gives rise to three RV marginals, a PDA and a PL branch.  There is  no disease in the right coronary artery or its branches.   IMPRESSION:  1. False-positive Cardiolite, most likely mechanical, but secondary to the     apical hypertrophy.  2. Normal wall motion, ejection fraction 65-70%.  3. Normal coronary arteries.    RECOMMENDATIONS:  The patient should obtain primary care physician. Other  etiologies  for chest pain and hand numbness should be considered.                                                   Meade Maw, MD    HP/MEDQ  D:  07/01/2002  T:  07/02/2002  Job:  [id number]

## 2011-07-09 ENCOUNTER — Emergency Department (HOSPITAL_COMMUNITY)
Admission: EM | Admit: 2011-07-09 | Discharge: 2011-07-09 | Disposition: A | Discharge status: Home / Self Care | Payer: Self-pay | Attending: Emergency Medicine | Admitting: Emergency Medicine

## 2011-07-09 DIAGNOSIS — M2550 Pain in unspecified joint: Secondary | ICD-10-CM | POA: Insufficient documentation

## 2011-07-09 DIAGNOSIS — G8929 Other chronic pain: Secondary | ICD-10-CM | POA: Insufficient documentation

## 2011-07-09 DIAGNOSIS — M545 Low back pain, unspecified: Secondary | ICD-10-CM | POA: Insufficient documentation

## 2011-08-07 LAB — I-STAT 8, (EC8 V) (CONVERTED LAB)
Acid-base deficit: 1
Chloride: 105
HCT: 51
Operator id: 192351
Potassium: 4.1
TCO2: 27
pCO2, Ven: 46.7

## 2011-08-07 LAB — DIFFERENTIAL
Basophils Absolute: 0
Lymphocytes Relative: 28
Monocytes Absolute: 0.9 — ABNORMAL HIGH
Monocytes Relative: 14 — ABNORMAL HIGH
Neutro Abs: 3.5
Neutrophils Relative %: 56

## 2011-08-07 LAB — CBC
Hemoglobin: 16.6
RBC: 5.1

## 2011-08-07 LAB — POCT I-STAT CREATININE
Creatinine, Ser: 1
Operator id: 192351

## 2011-09-01 ENCOUNTER — Ambulatory Visit: Payer: Self-pay | Admitting: Family Medicine

## 2011-09-08 ENCOUNTER — Encounter: Payer: Self-pay | Admitting: *Deleted

## 2011-09-08 ENCOUNTER — Emergency Department (HOSPITAL_COMMUNITY)
Admission: EM | Admit: 2011-09-08 | Discharge: 2011-09-08 | Disposition: A | Discharge status: Home / Self Care | Payer: Medicaid Other | Attending: Emergency Medicine | Admitting: Emergency Medicine

## 2011-09-08 DIAGNOSIS — M545 Low back pain, unspecified: Secondary | ICD-10-CM | POA: Insufficient documentation

## 2011-09-08 DIAGNOSIS — M549 Dorsalgia, unspecified: Secondary | ICD-10-CM

## 2011-09-08 DIAGNOSIS — M79609 Pain in unspecified limb: Secondary | ICD-10-CM | POA: Insufficient documentation

## 2011-09-08 MED ORDER — HYDROCODONE-ACETAMINOPHEN 5-325 MG PO TABS: 1.0000 | ORAL_TABLET | Freq: Four times a day (QID) | ORAL | Status: AC | PRN

## 2011-09-08 NOTE — ED Provider Notes (Signed)
History     CSN: [id number] Arrival date & time: 09/08/2011 11:04 AM   First MD Initiated Contact with Patient 09/08/11 1150      Chief Complaint  Patient presents with  . Back Pain   Patient is a 45 y.o. male presenting with back pain.  Back Pain  The pain is associated with no known injury. The pain is present in the lumbar spine. Pertinent negatives include no chest pain, no fever, no numbness, no weight loss, no headaches, no abdominal pain, no abdominal swelling, no bowel incontinence, no perianal numbness, no bladder incontinence, no dysuria, no pelvic pain, no leg pain, no paresthesias, no paresis, no tingling and no weakness.   Patient complains of lower back pain. Patient also complains of bilateral feet pain. Patient denies any chest pain or shortness of breath. Patient denies any injuries. Patient reports that this pain has been going on for months. Patient denies any loss of bowel or bladder function. Patient denies any weakness or numbness. Patient reports that he has an appointment without the medical clinics in one week. Patient denies any other concerns. Patient denies any rashes. Patient denies any fevers or chills. Patient denies any dysuria.  History reviewed. No pertinent past medical history.  History reviewed. No pertinent past surgical history.  History reviewed. No pertinent family history.  History  Substance Use Topics  . Smoking status: Current Everyday Smoker -- 0.5 packs/day    Types: Cigarettes  . Smokeless tobacco: Not on file  . Alcohol Use:       Review of Systems  Constitutional: Negative for fever, chills, weight loss, diaphoresis and appetite change.  HENT: Negative for neck pain.   Eyes: Negative for photophobia and visual disturbance.  Respiratory: Negative for cough, chest tightness and shortness of breath.   Cardiovascular: Negative for chest pain.  Gastrointestinal: Negative for nausea, vomiting, abdominal pain and bowel incontinence.    Genitourinary: Negative for bladder incontinence, dysuria, flank pain and pelvic pain.  Musculoskeletal: Positive for back pain.  Skin: Negative for rash.  Neurological: Negative for tingling, weakness, numbness, headaches and paresthesias.  All other systems reviewed and are negative.    Allergies  Penicillins and Uncoded nonscreenable allergen  Home Medications  No current outpatient prescriptions on file.  BP 139/94  Pulse 97  Temp(Src) 98.2 F (36.8 C) (Oral)  Resp 16  SpO2 98%  Physical Exam  Nursing note and vitals reviewed. Constitutional: He is oriented to person, place, and time. He appears well-developed and well-nourished.  HENT:  Head: Normocephalic and atraumatic.  Eyes: EOM are normal. Pupils are equal, round, and reactive to light.  Neck: Normal range of motion. Neck supple. No JVD present.  Cardiovascular: Normal rate, regular rhythm, normal heart sounds and intact distal pulses.  Exam reveals no gallop and no friction rub.   No murmur heard. Pulmonary/Chest: Effort normal and breath sounds normal. No respiratory distress. He has no wheezes.  Abdominal: Soft. Bowel sounds are normal. He exhibits no distension. There is no tenderness.  Musculoskeletal: Normal range of motion. He exhibits tenderness. He exhibits no edema.  Neurological: He is alert and oriented to person, place, and time. He displays normal reflexes. No cranial nerve deficit or sensory deficit. He exhibits normal muscle tone. Coordination and gait normal. He displays no Babinski's sign on the right side. He displays no Babinski's sign on the left side.  Reflex Scores:      Patellar reflexes are 2+ on the right side and 3+ on the  left side.      Achilles reflexes are 2+ on the right side and 3+ on the left side. Skin: Skin is warm and dry. No rash noted. No erythema. No pallor.  Psychiatric: He has a normal mood and affect. His behavior is normal. Judgment and thought content normal.    ED  Course  Procedures (including critical care time)  Patient seen and evaluated.  VSS reviewed. . Nursing notes reviewed.Will monitor the patient closely. They agree with the treatment plan and diagnosis. No trauma or falls. No imaging needed at this time, no warning signs concerning for cauda equina syndrome.    MDM  Back pain        Benson Setting, [place] 09/08/11 1244

## 2011-09-08 NOTE — ED Provider Notes (Signed)
Medical screening examination/treatment/procedure(s) were performed by non-physician practitioner and as supervising physician I was immediately available for consultation/collaboration.  Virgel Manifold, MD 09/08/11 2209

## 2011-09-08 NOTE — ED Notes (Signed)
To ed for eval of lower back pain, knee pain, leg pain. Pt states he is out of pain meds. No injury noted.

## 2012-01-21 ENCOUNTER — Telehealth: Payer: Self-pay | Admitting: *Deleted

## 2012-01-21 NOTE — Telephone Encounter (Signed)
Error. No encounter

## 2013-07-24 ENCOUNTER — Emergency Department (HOSPITAL_COMMUNITY): Payer: Medicaid Other

## 2013-07-24 ENCOUNTER — Emergency Department (HOSPITAL_COMMUNITY)
Admission: EM | Admit: 2013-07-24 | Discharge: 2013-07-24 | Disposition: A | Discharge status: Home / Self Care | Payer: Medicaid Other | Attending: Emergency Medicine | Admitting: Emergency Medicine

## 2013-07-24 ENCOUNTER — Encounter (HOSPITAL_COMMUNITY): Payer: Self-pay | Admitting: *Deleted

## 2013-07-24 DIAGNOSIS — R066 Hiccough: Secondary | ICD-10-CM | POA: Insufficient documentation

## 2013-07-24 DIAGNOSIS — I10 Essential (primary) hypertension: Secondary | ICD-10-CM | POA: Insufficient documentation

## 2013-07-24 DIAGNOSIS — Z9861 Coronary angioplasty status: Secondary | ICD-10-CM | POA: Insufficient documentation

## 2013-07-24 DIAGNOSIS — Z880 Allergy status to penicillin: Secondary | ICD-10-CM | POA: Insufficient documentation

## 2013-07-24 DIAGNOSIS — F17200 Nicotine dependence, unspecified, uncomplicated: Secondary | ICD-10-CM | POA: Insufficient documentation

## 2013-07-24 DIAGNOSIS — Z8719 Personal history of other diseases of the digestive system: Secondary | ICD-10-CM | POA: Insufficient documentation

## 2013-07-24 DIAGNOSIS — R1013 Epigastric pain: Secondary | ICD-10-CM | POA: Insufficient documentation

## 2013-07-24 DIAGNOSIS — Z79899 Other long term (current) drug therapy: Secondary | ICD-10-CM | POA: Insufficient documentation

## 2013-07-24 LAB — COMPREHENSIVE METABOLIC PANEL
AST: 73 U/L — ABNORMAL HIGH (ref 0–37)
Albumin: 3.6 g/dL (ref 3.5–5.2)
BUN: 5 mg/dL — ABNORMAL LOW (ref 6–23)
Calcium: 9.3 mg/dL (ref 8.4–10.5)
Chloride: 100 mEq/L (ref 96–112)
Creatinine, Ser: 0.72 mg/dL (ref 0.50–1.35)
Total Protein: 7.5 g/dL (ref 6.0–8.3)

## 2013-07-24 LAB — CBC
MCHC: 35.6 g/dL (ref 30.0–36.0)
MCV: 92.2 fL (ref 78.0–100.0)
Platelets: 184 10*3/uL (ref 150–400)
RDW: 14.2 % (ref 11.5–15.5)
WBC: 5.5 10*3/uL (ref 4.0–10.5)

## 2013-07-24 LAB — TROPONIN I: Troponin I: <0.3 ng/mL (ref <0.30)

## 2013-07-24 MED ORDER — PANTOPRAZOLE SODIUM 40 MG IV SOLR
40.0000 mg | Freq: Once | INTRAVENOUS | Status: AC
  Administered 2013-07-24: 40 mg via INTRAVENOUS
  Filled 2013-07-24: qty 40

## 2013-07-24 MED ORDER — METOCLOPRAMIDE HCL 5 MG/ML IJ SOLN
10.0000 mg | Freq: Once | INTRAMUSCULAR | Status: AC
  Administered 2013-07-24: 10 mg via INTRAVENOUS
  Filled 2013-07-24: qty 2

## 2013-07-24 MED ORDER — METOCLOPRAMIDE HCL 10 MG PO TABS: 10.0000 mg | ORAL_TABLET | Freq: Three times a day (TID) | ORAL | Status: DC

## 2013-07-24 MED ORDER — FAMOTIDINE 20 MG PO TABS: 20.0000 mg | ORAL_TABLET | Freq: Two times a day (BID) | ORAL | Status: DC

## 2013-07-24 MED ORDER — GI COCKTAIL ~~LOC~~
30.0000 mL | Freq: Once | ORAL | Status: AC
  Administered 2013-07-24: 30 mL via ORAL
  Filled 2013-07-24: qty 30

## 2013-07-24 NOTE — ED Provider Notes (Signed)
CSN: [id number]     Arrival date & time 07/24/13  0215 History   First MD Initiated Contact with Patient 07/24/13 0232     Chief Complaint  Patient presents with  . Abdominal Pain  . Hiccups   (Consider location/radiation/quality/duration/timing/severity/associated sxs/prior Treatment) HPI History provided by patient. Persistent hiccups for the last week. No known aggravating or alleviating factors. He has tried multiple home remedies and over-the-counter medications without relief. No history of same. No new medications. With hiccups has some epigastric discomfort which has become more progressive. He has some heartburn/ reflux symptoms with this. No fevers or chills. No hemoptysis. No abdominal pain otherwise. Symptoms moderate to severe. Patient is a current everyday smoker. He is followed by the [facility]  History reviewed. No pertinent past medical history. Past Surgical History  Procedure Laterality Date  . Cardiac catheterization      2   History reviewed. No pertinent family history. History  Substance Use Topics  . Smoking status: Current Every Day Smoker -- 0.50 packs/day    Types: Cigarettes  . Smokeless tobacco: Not on file  . Alcohol Use: Yes    Review of Systems  Constitutional: Negative for fever and chills.  HENT: Negative for sore throat and neck pain.   Respiratory: Negative for cough and stridor.   Cardiovascular: Negative for chest pain.  Gastrointestinal: Positive for abdominal pain.  Genitourinary: Negative for dysuria.  Musculoskeletal: Negative for back pain.  Skin: Negative for rash.  Neurological: Negative for headaches.  All other systems reviewed and are negative.    Allergies  Uncoded nonscreenable allergen; Penicillins; and [name]  Home Medications   Current Outpatient Rx  Name  Route  Sig  Dispense  Refill  . Cholecalciferol (VITAMIN D PO)   Oral   Take 1 tablet by mouth daily.         Marland Kitchen PRESCRIPTION MEDICATION  Oral   Take 1 tablet by mouth daily. BP med         . sertraline (ZOLOFT) 25 MG tablet   Oral   Take 12.5 mg by mouth daily.         . traZODone (DESYREL) 50 MG tablet   Oral   Take 50 mg by mouth at bedtime as needed for sleep.          BP 119/79  Pulse 98  Temp(Src) 98.8 F (37.1 C) (Oral)  Resp 16  SpO2 98% Physical Exam  Constitutional: He is oriented to person, place, and time. He appears well-developed and well-nourished.  HENT:  Head: Normocephalic and atraumatic.  Eyes: EOM are normal. Pupils are equal, round, and reactive to light.  Neck: Neck supple.  Cardiovascular: Normal rate, regular rhythm and intact distal pulses.   Pulmonary/Chest: Effort normal and breath sounds normal. No respiratory distress. He exhibits no tenderness.  Abdominal: Soft. Bowel sounds are normal. He exhibits no distension and no mass. There is no rebound and no guarding.  Some tenderness over epigastric region. No abdominal tenderness otherwise. Negative Murphy sign.  Musculoskeletal: Normal range of motion. He exhibits no edema and no tenderness.  Neurological: He is alert and oriented to person, place, and time.  Skin: Skin is warm and dry.    ED Course  Procedures (including critical care time) Labs Review Labs Reviewed  COMPREHENSIVE METABOLIC PANEL - Abnormal; Notable for the following:    Glucose, Bld 114 (*)    BUN 5 (*)    AST 73 (*)    ALT 75 (*)  All other components within normal limits  CBC  TROPONIN I   Imaging Review Dg Chest 2 View  07/24/2013   *RADIOLOGY REPORT*  Clinical Data: Epigastric abdominal pain and vomiting.  Diarrhea.  CHEST - 2 VIEW  Comparison: Chest radiograph performed 11/17/2009  Findings: The lungs are well-aerated and clear.  There is no evidence of focal opacification, pleural effusion or pneumothorax.  The heart is normal in size; the mediastinal contour is within normal limits.  No acute osseous abnormalities are seen.  IMPRESSION: No acute  cardiopulmonary process seen.   Original Report Authenticated By: Santa Lighter, M.D.    Date: 07/24/2013  Rate: 101  Rhythm: sinus tachycardia  QRS Axis: normal  Intervals: normal  ST/T Wave abnormalities: nonspecific ST/T changes  Conduction Disutrbances:none  Narrative Interpretation:   Old EKG Reviewed: changes noted inferior lead T-wave inversions new from previous EKG dated 11/18/2009  IV Reglan provided. IV Protonix. On recheck hiccups resolved. He is feeling better and wants to go home. He denies any ACS symptoms. Neg troponin with one week of symptoms.   Labs and ECG and CXR findings shared with patient. He has a cardiologist at the [place], He has had cardiac cath a few years ago and agrees to f/u PCP regarding elevated LFTs and CA follow up for ECG findings.   Rx Reglan provided with return precautions verbalized as understood.   MDM  Dx: Hiccups Resolved with IV reglan CXR as above Labs reviewed as above. Elevated LFTs. VS and nurses notes reviewed and considered  Teressa Lower, MD 07/24/13 [phone]

## 2013-07-24 NOTE — ED Notes (Signed)
Pt states that he has had the hicups for the past 3 days. Pt states that it is to the point now that he has epigastric pain, vomiting and he is burping as well. Pt states that the pain in his stomach is burning pain.

## 2013-07-24 NOTE — ED Notes (Signed)
Patient's hiccups have stopped at this time, after vomiting twice and medication administration.

## 2013-07-25 ENCOUNTER — Emergency Department (HOSPITAL_COMMUNITY)
Admission: EM | Admit: 2013-07-25 | Discharge: 2013-07-25 | Disposition: A | Discharge status: Home / Self Care | Payer: Medicaid Other | Attending: Emergency Medicine | Admitting: Emergency Medicine

## 2013-07-25 ENCOUNTER — Encounter (HOSPITAL_COMMUNITY): Payer: Self-pay

## 2013-07-25 DIAGNOSIS — I10 Essential (primary) hypertension: Secondary | ICD-10-CM | POA: Insufficient documentation

## 2013-07-25 DIAGNOSIS — Z79899 Other long term (current) drug therapy: Secondary | ICD-10-CM | POA: Insufficient documentation

## 2013-07-25 DIAGNOSIS — F17200 Nicotine dependence, unspecified, uncomplicated: Secondary | ICD-10-CM | POA: Insufficient documentation

## 2013-07-25 DIAGNOSIS — R0609 Other forms of dyspnea: Secondary | ICD-10-CM | POA: Insufficient documentation

## 2013-07-25 DIAGNOSIS — K219 Gastro-esophageal reflux disease without esophagitis: Secondary | ICD-10-CM | POA: Insufficient documentation

## 2013-07-25 DIAGNOSIS — R0989 Other specified symptoms and signs involving the circulatory and respiratory systems: Secondary | ICD-10-CM | POA: Insufficient documentation

## 2013-07-25 DIAGNOSIS — Z880 Allergy status to penicillin: Secondary | ICD-10-CM | POA: Insufficient documentation

## 2013-07-25 DIAGNOSIS — R0602 Shortness of breath: Secondary | ICD-10-CM | POA: Insufficient documentation

## 2013-07-25 DIAGNOSIS — Z9889 Other specified postprocedural states: Secondary | ICD-10-CM | POA: Insufficient documentation

## 2013-07-25 DIAGNOSIS — R066 Hiccough: Secondary | ICD-10-CM | POA: Insufficient documentation

## 2013-07-25 HISTORY — DX: Acute pancreatitis without necrosis or infection, unspecified: K85.90

## 2013-07-25 HISTORY — DX: Essential (primary) hypertension: I10

## 2013-07-25 HISTORY — DX: Gastro-esophageal reflux disease without esophagitis: K21.9

## 2013-07-25 LAB — TROPONIN I: Troponin I: <0.3 ng/mL (ref <0.30)

## 2013-07-25 MED ORDER — METOCLOPRAMIDE HCL 5 MG/ML IJ SOLN
10.0000 mg | Freq: Once | INTRAMUSCULAR | Status: AC
  Administered 2013-07-25: 10 mg via INTRAVENOUS
  Filled 2013-07-25: qty 2

## 2013-07-25 MED ORDER — CHLORPROMAZINE HCL 25 MG PO TABS: 25.0000 mg | ORAL_TABLET | Freq: Three times a day (TID) | ORAL | Status: DC

## 2013-07-25 NOTE — ED Notes (Signed)
Dr. Stark Jock at the bedside.

## 2013-07-25 NOTE — ED Notes (Signed)
Pt reports hiccups and SOB that started yesterday, was seen here yesterday for same. Hiccups went away yesterday but after he was discharged home the hiccups started associated with SOB. O2-100%. RR-22. Yesterday pt was prescribed pepcid and reglan but has not gotten them filled.

## 2013-07-25 NOTE — ED Provider Notes (Signed)
CSN: [id number]     Arrival date & time 07/25/13  9030 History   First MD Initiated Contact with Patient 07/25/13 0730     Chief Complaint  Patient presents with  . Hiccups  . Shortness of Breath   (Consider location/radiation/quality/duration/timing/severity/associated sxs/prior Treatment) HPI Comments: Patient is a 47 year old male past medical history significant for cardiac catheterization. He presents today with complaints of pickups and shortness of breath and been going on for the past 3 weeks. He was seen here yesterday morning and was given IV Reglan which seemed to help with his symptoms. He denies any productive cough. He states that it does make it difficult for him to breathe. Workup last night reveals a normal chest x-ray, unchanged EKG, negative troponin, but slightly elevated liver functions.  Patient is a 47 y.o. male presenting with shortness of breath. The history is provided by the patient.  Shortness of Breath Severity:  Moderate Onset quality:  Gradual Duration:  3 weeks Timing:  Constant Progression:  Unchanged Chronicity:  New Context: not activity   Relieved by:  Nothing Worsened by:  Nothing tried Ineffective treatments:  None tried Associated symptoms comment:  Hiccups.   Past Medical History  Diagnosis Date  . Acid reflux   . Hypertension   . Pancreatitis    Past Surgical History  Procedure Laterality Date  . Cardiac catheterization      2  . Eye surgery Left    No family history on file. History  Substance Use Topics  . Smoking status: Current Every Day Smoker -- 0.50 packs/day    Types: Cigarettes  . Smokeless tobacco: Not on file  . Alcohol Use: Yes    Review of Systems  Respiratory: Positive for shortness of breath.   All other systems reviewed and are negative.    Allergies  Uncoded nonscreenable allergen; Penicillins; and [name]  Home Medications   Current Outpatient Rx  Name  Route  Sig  Dispense  Refill  .  Cholecalciferol (VITAMIN D PO)   Oral   Take 1 tablet by mouth daily.         . famotidine (PEPCID) 20 MG tablet   Oral   Take 1 tablet (20 mg total) by mouth 2 (two) times daily.   30 tablet   0   . metoCLOPramide (REGLAN) 10 MG tablet   Oral   Take 1 tablet (10 mg total) by mouth 3 (three) times daily.   30 tablet   0   . PRESCRIPTION MEDICATION   Oral   Take 1 tablet by mouth daily. BP med         . sertraline (ZOLOFT) 25 MG tablet   Oral   Take 12.5 mg by mouth daily.         . traZODone (DESYREL) 50 MG tablet   Oral   Take 50 mg by mouth at bedtime as needed for sleep.          BP 126/93  Pulse 72  Temp(Src) 97.7 F (36.5 C) (Oral)  Resp 20  SpO2 97% Physical Exam  Nursing note and vitals reviewed. Constitutional: He is oriented to person, place, and time. He appears well-developed and well-nourished. No distress.  HENT:  Head: Normocephalic and atraumatic.  Mouth/Throat: Oropharynx is clear and moist.  Neck: Normal range of motion. Neck supple.  Cardiovascular: Normal rate, regular rhythm and normal heart sounds.   No murmur heard. Pulmonary/Chest: Effort normal and breath sounds normal. No respiratory distress. He has  no wheezes.  Abdominal: Soft. Bowel sounds are normal. He exhibits no distension. There is no tenderness.  Musculoskeletal: Normal range of motion. He exhibits no edema.  Lymphadenopathy:    He has no cervical adenopathy.  Neurological: He is alert and oriented to person, place, and time.  Skin: Skin is warm and dry. He is not diaphoretic.    ED Course  Procedures (including critical care time) Labs Review Labs Reviewed  TROPONIN I   Imaging Review Dg Chest 2 View  07/24/2013   *RADIOLOGY REPORT*  Clinical Data: Epigastric abdominal pain and vomiting.  Diarrhea.  CHEST - 2 VIEW  Comparison: Chest radiograph performed 11/17/2009  Findings: The lungs are well-aerated and clear.  There is no evidence of focal opacification,  pleural effusion or pneumothorax.  The heart is normal in size; the mediastinal contour is within normal limits.  No acute osseous abnormalities are seen.  IMPRESSION: No acute cardiopulmonary process seen.   Original Report Authenticated By: Santa Lighter, M.D.    Date: 07/25/2013  Rate: 72  Rhythm: sinus arrhythmia   QRS Axis: normal  Intervals: PR prolonged  ST/T Wave abnormalities: nonspecific T wave changes  Conduction Disutrbances:first-degree A-V block   Narrative Interpretation:   Old EKG Reviewed: unchanged    MDM  No diagnosis found. Patient returns with complaints of recurrent hiccups. He was seen yesterday workup was unremarkable. He was given medications and was feeling better at that time, however his symptoms have returned. He states when he is hiccuping it makes him have difficulty breathing. His breathing is improved now that his hiccups have resolved. His troponin remains negative and EKG is unchanged. At this point I do not feel as though further workup is indicated. His imaging studies and labs from yesterday were reviewed and were negative. Will discharge to home with prescription for Thorazine.    Veryl Speak, MD 07/25/13 [phone]

## 2013-08-25 ENCOUNTER — Encounter (HOSPITAL_COMMUNITY): Payer: Self-pay | Admitting: Emergency Medicine

## 2013-08-25 ENCOUNTER — Emergency Department (HOSPITAL_COMMUNITY): Payer: Non-veteran care

## 2013-08-25 ENCOUNTER — Emergency Department (HOSPITAL_COMMUNITY)
Admission: EM | Admit: 2013-08-25 | Discharge: 2013-08-26 | Disposition: A | Discharge status: Other Acute Inpatient Hospital | Payer: Non-veteran care | Attending: Emergency Medicine | Admitting: Emergency Medicine

## 2013-08-25 DIAGNOSIS — I10 Essential (primary) hypertension: Secondary | ICD-10-CM

## 2013-08-25 DIAGNOSIS — F1010 Alcohol abuse, uncomplicated: Secondary | ICD-10-CM

## 2013-08-25 DIAGNOSIS — F32A Depression, unspecified: Secondary | ICD-10-CM

## 2013-08-25 DIAGNOSIS — X76XXXA Intentional self-harm by smoke, fire and flames, initial encounter: Secondary | ICD-10-CM | POA: Insufficient documentation

## 2013-08-25 DIAGNOSIS — Z9861 Coronary angioplasty status: Secondary | ICD-10-CM | POA: Insufficient documentation

## 2013-08-25 DIAGNOSIS — F17200 Nicotine dependence, unspecified, uncomplicated: Secondary | ICD-10-CM | POA: Insufficient documentation

## 2013-08-25 DIAGNOSIS — Z91148 Patient's other noncompliance with medication regimen for other reason: Secondary | ICD-10-CM

## 2013-08-25 DIAGNOSIS — Z9119 Patient's noncompliance with other medical treatment and regimen: Secondary | ICD-10-CM | POA: Insufficient documentation

## 2013-08-25 DIAGNOSIS — R079 Chest pain, unspecified: Secondary | ICD-10-CM | POA: Insufficient documentation

## 2013-08-25 DIAGNOSIS — R51 Headache: Secondary | ICD-10-CM | POA: Insufficient documentation

## 2013-08-25 DIAGNOSIS — Z91199 Patient's noncompliance with other medical treatment and regimen due to unspecified reason: Secondary | ICD-10-CM | POA: Insufficient documentation

## 2013-08-25 DIAGNOSIS — Z79899 Other long term (current) drug therapy: Secondary | ICD-10-CM | POA: Insufficient documentation

## 2013-08-25 DIAGNOSIS — Z9114 Patient's other noncompliance with medication regimen: Secondary | ICD-10-CM

## 2013-08-25 DIAGNOSIS — IMO0002 Reserved for concepts with insufficient information to code with codable children: Secondary | ICD-10-CM

## 2013-08-25 DIAGNOSIS — H538 Other visual disturbances: Secondary | ICD-10-CM | POA: Insufficient documentation

## 2013-08-25 DIAGNOSIS — Z7289 Other problems related to lifestyle: Secondary | ICD-10-CM

## 2013-08-25 DIAGNOSIS — G479 Sleep disorder, unspecified: Secondary | ICD-10-CM | POA: Insufficient documentation

## 2013-08-25 DIAGNOSIS — R45851 Suicidal ideations: Secondary | ICD-10-CM

## 2013-08-25 DIAGNOSIS — T23209A Burn of second degree of unspecified hand, unspecified site, initial encounter: Secondary | ICD-10-CM | POA: Insufficient documentation

## 2013-08-25 DIAGNOSIS — K219 Gastro-esophageal reflux disease without esophagitis: Secondary | ICD-10-CM | POA: Insufficient documentation

## 2013-08-25 DIAGNOSIS — Z880 Allergy status to penicillin: Secondary | ICD-10-CM | POA: Insufficient documentation

## 2013-08-25 DIAGNOSIS — F3289 Other specified depressive episodes: Secondary | ICD-10-CM | POA: Insufficient documentation

## 2013-08-25 DIAGNOSIS — F329 Major depressive disorder, single episode, unspecified: Secondary | ICD-10-CM

## 2013-08-25 LAB — COMPREHENSIVE METABOLIC PANEL
AST: 44 U/L — ABNORMAL HIGH (ref 0–37)
BUN: 6 mg/dL (ref 6–23)
CO2: 22 mEq/L (ref 19–32)
Calcium: 9 mg/dL (ref 8.4–10.5)
Chloride: 103 mEq/L (ref 96–112)
Creatinine, Ser: 0.8 mg/dL (ref 0.50–1.35)
GFR calc Af Amer: >90 mL/min (ref >90)
GFR calc non Af Amer: >90 mL/min (ref >90)
Glucose, Bld: 87 mg/dL (ref 70–99)
Total Bilirubin: 0.4 mg/dL (ref 0.3–1.2)

## 2013-08-25 LAB — URINALYSIS, ROUTINE W REFLEX MICROSCOPIC
Glucose, UA: NEGATIVE mg/dL
Hgb urine dipstick: NEGATIVE
Nitrite: NEGATIVE
Protein, ur: NEGATIVE mg/dL
Specific Gravity, Urine: 1.013 (ref 1.005–1.030)
Urobilinogen, UA: 0.2 mg/dL (ref 0.0–1.0)

## 2013-08-25 LAB — CBC
HCT: 48.7 % (ref 39.0–52.0)
Hemoglobin: 17.3 g/dL — ABNORMAL HIGH (ref 13.0–17.0)
MCH: 33.9 pg (ref 26.0–34.0)
MCV: 95.3 fL (ref 78.0–100.0)
RBC: 5.11 MIL/uL (ref 4.22–5.81)
RDW: 14.5 % (ref 11.5–15.5)

## 2013-08-25 LAB — RAPID URINE DRUG SCREEN, HOSP PERFORMED
Amphetamines: NOT DETECTED
Cocaine: NOT DETECTED
Opiates: NOT DETECTED
Tetrahydrocannabinol: NOT DETECTED

## 2013-08-25 LAB — OCCULT BLOOD, POC DEVICE: Fecal Occult Bld: NEGATIVE

## 2013-08-25 LAB — LIPASE, BLOOD: Lipase: 48 U/L (ref 11–59)

## 2013-08-25 LAB — TROPONIN I: Troponin I: <0.3 ng/mL (ref <0.30)

## 2013-08-25 MED ORDER — LORAZEPAM 1 MG PO TABS
0.0000 mg | ORAL_TABLET | Freq: Four times a day (QID) | ORAL | Status: DC
  Administered 2013-08-25: 1 mg via ORAL
  Filled 2013-08-25 (×2): qty 1

## 2013-08-25 MED ORDER — LORAZEPAM 1 MG PO TABS
0.0000 mg | ORAL_TABLET | Freq: Two times a day (BID) | ORAL | Status: DC
  Administered 2013-08-26: 1 mg via ORAL

## 2013-08-25 MED ORDER — SODIUM CHLORIDE 0.9 % IV BOLUS (SEPSIS)
1000.0000 mL | Freq: Once | INTRAVENOUS | Status: AC
  Administered 2013-08-25: 1000 mL via INTRAVENOUS

## 2013-08-25 MED ORDER — ONDANSETRON HCL 4 MG PO TABS: 4.0000 mg | ORAL_TABLET | Freq: Three times a day (TID) | ORAL | Status: DC | PRN

## 2013-08-25 MED ORDER — ONDANSETRON 4 MG PO TBDP
4.0000 mg | ORAL_TABLET | Freq: Once | ORAL | Status: AC
  Administered 2013-08-25: 4 mg via ORAL
  Filled 2013-08-25: qty 1

## 2013-08-25 MED ORDER — NICOTINE 21 MG/24HR TD PT24
21.0000 mg | MEDICATED_PATCH | Freq: Every day | TRANSDERMAL | Status: DC
  Administered 2013-08-26: 21 mg via TRANSDERMAL
  Filled 2013-08-25: qty 1

## 2013-08-25 NOTE — ED Provider Notes (Signed)
Jacob Adams S 8:30 PM the patient discussed in sign out. Patient presenting primarily for medical clearance with request for alcohol detox and suicidal ideations. The patient however had multiple additional complaints including some nonspecific atypical chest pains. Initial EKG and troponin unremarkable. Other workup also without concerning findings for emergent medical condition.  A second troponin is pending.  Patient with a negative second troponin. At this point he is medically cleared for further psychiatric evaluation and treatment.  Martie Lee, PA-C 08/25/13 2334

## 2013-08-25 NOTE — ED Notes (Signed)
Pt is followed by mobile crisis, who will work on bed placement once pt is medically cleared, no need for TTS consult

## 2013-08-25 NOTE — ED Notes (Signed)
Brown stool on glove for fecal occult blood test
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
## 2013-08-26 ENCOUNTER — Encounter (HOSPITAL_COMMUNITY): Payer: Self-pay | Admitting: *Deleted

## 2013-08-26 ENCOUNTER — Inpatient Hospital Stay (HOSPITAL_COMMUNITY)
Admission: AD | Admit: 2013-08-26 | Discharge: 2013-09-01 | DRG: 885 | Disposition: A | Discharge status: Home / Self Care | Payer: Federal, State, Local not specified - Other | Source: Intra-hospital | Attending: Psychiatry | Admitting: Psychiatry

## 2013-08-26 DIAGNOSIS — F1020 Alcohol dependence, uncomplicated: Secondary | ICD-10-CM

## 2013-08-26 DIAGNOSIS — Z5900 Homelessness unspecified: Secondary | ICD-10-CM

## 2013-08-26 DIAGNOSIS — F332 Major depressive disorder, recurrent severe without psychotic features: Secondary | ICD-10-CM

## 2013-08-26 DIAGNOSIS — F323 Major depressive disorder, single episode, severe with psychotic features: Secondary | ICD-10-CM

## 2013-08-26 DIAGNOSIS — R45851 Suicidal ideations: Secondary | ICD-10-CM

## 2013-08-26 DIAGNOSIS — Z79899 Other long term (current) drug therapy: Secondary | ICD-10-CM

## 2013-08-26 DIAGNOSIS — F333 Major depressive disorder, recurrent, severe with psychotic symptoms: Principal | ICD-10-CM | POA: Diagnosis present

## 2013-08-26 DIAGNOSIS — K219 Gastro-esophageal reflux disease without esophagitis: Secondary | ICD-10-CM | POA: Diagnosis present

## 2013-08-26 DIAGNOSIS — I10 Essential (primary) hypertension: Secondary | ICD-10-CM | POA: Diagnosis present

## 2013-08-26 HISTORY — DX: Mental disorder, not otherwise specified: F99

## 2013-08-26 HISTORY — DX: Headache: R51

## 2013-08-26 HISTORY — DX: Depression, unspecified: F32.A

## 2013-08-26 HISTORY — DX: Major depressive disorder, single episode, unspecified: F32.9

## 2013-08-26 MED ORDER — HYDROXYZINE HCL 25 MG PO TABS
25.0000 mg | ORAL_TABLET | Freq: Four times a day (QID) | ORAL | Status: AC | PRN
  Administered 2013-08-26 – 2013-08-28 (×3): 25 mg via ORAL
  Filled 2013-08-26 (×3): qty 1

## 2013-08-26 MED ORDER — INFLUENZA VAC SPLIT QUAD 0.5 ML IM SUSP
0.5000 mL | INTRAMUSCULAR | Status: AC
  Administered 2013-08-27: 0.5 mL via INTRAMUSCULAR
  Filled 2013-08-26: qty 0.5

## 2013-08-26 MED ORDER — MAGNESIUM HYDROXIDE 400 MG/5ML PO SUSP: 30.0000 mL | Freq: Every day | ORAL | Status: DC | PRN

## 2013-08-26 MED ORDER — VITAMIN B-1 100 MG PO TABS
100.0000 mg | ORAL_TABLET | Freq: Every day | ORAL | Status: DC
  Administered 2013-08-27 – 2013-09-01 (×6): 100 mg via ORAL
  Filled 2013-08-26 (×9): qty 1

## 2013-08-26 MED ORDER — ADULT MULTIVITAMIN W/MINERALS CH
1.0000 | ORAL_TABLET | Freq: Every day | ORAL | Status: DC
  Administered 2013-08-26 – 2013-09-01 (×7): 1 via ORAL
  Filled 2013-08-26 (×10): qty 1

## 2013-08-26 MED ORDER — LOPERAMIDE HCL 2 MG PO CAPS: 2.0000 mg | ORAL_CAPSULE | ORAL | Status: AC | PRN

## 2013-08-26 MED ORDER — PNEUMOCOCCAL VAC POLYVALENT 25 MCG/0.5ML IJ INJ
0.5000 mL | INJECTION | INTRAMUSCULAR | Status: AC
  Administered 2013-08-27: 0.5 mL via INTRAMUSCULAR

## 2013-08-26 MED ORDER — NICOTINE 21 MG/24HR TD PT24
21.0000 mg | MEDICATED_PATCH | Freq: Every day | TRANSDERMAL | Status: DC
  Administered 2013-08-27 – 2013-09-01 (×6): 21 mg via TRANSDERMAL
  Filled 2013-08-26 (×8): qty 1

## 2013-08-26 MED ORDER — CHLORDIAZEPOXIDE HCL 25 MG PO CAPS
25.0000 mg | ORAL_CAPSULE | Freq: Four times a day (QID) | ORAL | Status: AC | PRN
  Administered 2013-08-26: 25 mg via ORAL
  Filled 2013-08-26: qty 1

## 2013-08-26 MED ORDER — ONDANSETRON 4 MG PO TBDP: 4.0000 mg | ORAL_TABLET | Freq: Four times a day (QID) | ORAL | Status: AC | PRN

## 2013-08-26 MED ORDER — ALUM & MAG HYDROXIDE-SIMETH 200-200-20 MG/5ML PO SUSP: 30.0000 mL | ORAL | Status: DC | PRN

## 2013-08-26 MED ORDER — ACETAMINOPHEN 325 MG PO TABS: 650.0000 mg | ORAL_TABLET | Freq: Four times a day (QID) | ORAL | Status: DC | PRN

## 2013-08-26 MED ORDER — TRAZODONE HCL 50 MG PO TABS
50.0000 mg | ORAL_TABLET | Freq: Every evening | ORAL | Status: DC | PRN
  Administered 2013-08-26 – 2013-08-29 (×4): 50 mg via ORAL
  Filled 2013-08-26 (×4): qty 1

## 2013-08-26 MED ORDER — THIAMINE HCL 100 MG/ML IJ SOLN: 100.0000 mg | Freq: Once | INTRAMUSCULAR | Status: DC

## 2013-08-26 NOTE — Progress Notes (Signed)
CSW informed mobile crisis that patient has bed at [facility].   Dorathy Kinsman, LCSW [phone]  ED CSW .08/26/2013 1422pm

## 2013-08-26 NOTE — Progress Notes (Signed)
Pt accepted to [facility] pending bed availability.   Dorathy Kinsman, LCSW [phone]  ED CSW .08/26/2013 1135

## 2013-08-26 NOTE — Consult Note (Signed)
[place] [facility]iat[facility] Consult   Reason for Consult:  depression Referring Physician:  EDP  DEMARRIUS Adams is an 47 y.o. male.  Assessment: AXIS I:  Major Depression, Recurrent severe, alcohol dependence AXIS II:  Deferred AXIS III:   Past Medical History  Diagnosis Date  . Acid reflux   . Hypertension   . Pancreatitis    AXIS IV:  other psychosocial or environmental problems AXIS V:  11-20 some danger of hurting self or others possible OR occasionally fails to maintain minimal personal hygiene OR gross impairment in communication  Plan:  Recommend psychiatric Inpatient admission when medically cleared.  Subjective:   Jacob Adams is a 47 y.o. male patient admitted with alcohol dependence, irritability, anger issues, depression, anxiety, and suicidal ideation. He reports a history of suicide attempts of walking out in front of a vehicle, and also took a handful of pills while drinking alcohol in Oct 2014. He continues to report SI (no plan) and AH (someone calling his name) and VH (nonspecific). No HI. +depression (9/10). Poor sleep/appetite.  HPI:  See above HPI Elements:   Location:  depression. Quality:  severe. Severity:  severe. Timing:  1 month. Duration:  1 month. Context:  negative people.  Past Psychiatric History: Past Medical History  Diagnosis Date  . Acid reflux   . Hypertension   . Pancreatitis     reports that he has been smoking Cigarettes.  He has been smoking about 0.50 packs per day. He does not have any smokeless tobacco history on file. He reports that he drinks alcohol. He reports that he does not use illicit drugs. No family history on file.         Allergies:   Allergies  Allergen Reactions  . Uncoded Nonscreenable Allergen Other (See Comments)    Sun tan lotion: swelling and peeling  . Penicillins Itching, Swelling and Rash  . [name] Other (See Comments)    DOESN'T EAT PORK-patient preference    ACT Assessment Complete:   Yes:    Educational Status    Risk to Self: Risk to self Substance abuse history and/or treatment for substance abuse?: Yes  Risk to Others:    Abuse:    Prior Inpatient Therapy:    Prior Outpatient Therapy:    Additional Information:          He last took zoloft 50 mg daily and trazodone 50 mg at bedtime 1 month ago. Meds prescribed by [facility] psychiatrist. H/o admission to [facility] 2014 and 2013.           Objective: Blood pressure 147/94, pulse 97, temperature 98 F (36.7 C), temperature source Oral, resp. rate 16, SpO2 97.00%.There is no height or weight on file to calculate BMI. Results for orders placed during the hospital encounter of 08/25/13 (from the past 72 hour(s))  CBC     Status: Abnormal   Collection Time    08/25/13  7:01 PM      Result Value Range   WBC 4.4  4.0 - 10.5 K/uL   RBC 5.11  4.22 - 5.81 MIL/uL   Hemoglobin 17.3 (*) 13.0 - 17.0 g/dL   HCT 48.7  39.0 - 52.0 %   MCV 95.3  78.0 - 100.0 fL   MCH 33.9  26.0 - 34.0 pg   MCHC 35.5  30.0 - 36.0 g/dL   RDW 14.5  11.5 - 15.5 %   Platelets 238  150 - 400 K/uL  TROPONIN I  Status: None   Collection Time    08/25/13  7:08 PM      Result Value Range   Troponin I <0.30  <0.30 ng/mL   Comment:            Due to the release kinetics of cTnI,     a negative result within the first hours     of the onset of symptoms does not rule out     myocardial infarction with certainty.     If myocardial infarction is still suspected,     repeat the test at appropriate intervals.  COMPREHENSIVE METABOLIC PANEL     Status: Abnormal   Collection Time    08/25/13  7:08 PM      Result Value Range   Sodium 138  135 - 145 mEq/L   Potassium 3.8  3.5 - 5.1 mEq/L   Chloride 103  96 - 112 mEq/L   CO2 22  19 - 32 mEq/L   Glucose, Bld 87  70 - 99 mg/dL   BUN 6  6 - 23 mg/dL   Creatinine, Ser 0.80  0.50 - 1.35 mg/dL   Calcium 9.0  8.4 - 10.5 mg/dL   Total Protein 7.5  6.0 - 8.3 g/dL   Albumin 3.8  3.5 - 5.2 g/dL   AST 44  (*) 0 - 37 U/L   ALT 29  0 - 53 U/L   Alkaline Phosphatase 62  39 - 117 U/L   Total Bilirubin 0.4  0.3 - 1.2 mg/dL   GFR calc non Af Amer >90  >90 mL/min   GFR calc Af Amer >90  >90 mL/min   Comment: (NOTE)     The eGFR has been calculated using the CKD EPI equation.     This calculation has not been validated in all clinical situations.     eGFR's persistently <90 mL/min signify possible Chronic Kidney     Disease.  ETHANOL     Status: Abnormal   Collection Time    08/25/13  7:08 PM      Result Value Range   Alcohol, Ethyl (B) 112 (*) 0 - 11 mg/dL   Comment:            LOWEST DETECTABLE LIMIT FOR     SERUM ALCOHOL IS 11 mg/dL     FOR MEDICAL PURPOSES ONLY  URINALYSIS, ROUTINE W REFLEX MICROSCOPIC     Status: Abnormal   Collection Time    08/25/13  8:23 PM      Result Value Range   Color, Urine YELLOW  YELLOW   APPearance CLOUDY (*) CLEAR   Specific Gravity, Urine 1.013  1.005 - 1.030   pH 5.5  5.0 - 8.0   Glucose, UA NEGATIVE  NEGATIVE mg/dL   Hgb urine dipstick NEGATIVE  NEGATIVE   Bilirubin Urine NEGATIVE  NEGATIVE   Ketones, ur NEGATIVE  NEGATIVE mg/dL   Protein, ur NEGATIVE  NEGATIVE mg/dL   Urobilinogen, UA 0.2  0.0 - 1.0 mg/dL   Nitrite NEGATIVE  NEGATIVE   Leukocytes, UA NEGATIVE  NEGATIVE   Comment: MICROSCOPIC NOT DONE ON URINES WITH NEGATIVE PROTEIN, BLOOD, LEUKOCYTES, NITRITE, OR GLUCOSE <1000 mg/dL.  URINE RAPID DRUG SCREEN (HOSP PERFORMED)     Status: None   Collection Time    08/25/13  8:23 PM      Result Value Range   Opiates NONE DETECTED  NONE DETECTED   Cocaine NONE DETECTED  NONE DETECTED   Benzodiazepines NONE  DETECTED  NONE DETECTED   Amphetamines NONE DETECTED  NONE DETECTED   Tetrahydrocannabinol NONE DETECTED  NONE DETECTED   Barbiturates NONE DETECTED  NONE DETECTED   Comment:            DRUG SCREEN FOR MEDICAL PURPOSES     ONLY.  IF CONFIRMATION IS NEEDED     FOR ANY PURPOSE, NOTIFY LAB     WITHIN 5 DAYS.                LOWEST DETECTABLE  LIMITS     FOR URINE DRUG SCREEN     Drug Class       Cutoff (ng/mL)     Amphetamine      1000     Barbiturate      200     Benzodiazepine   670     Tricyclics       141     Opiates          300     Cocaine          300     THC              50  OCCULT BLOOD, POC DEVICE     Status: None   Collection Time    08/25/13  8:37 PM      Result Value Range   Fecal Occult Bld NEGATIVE  NEGATIVE  LIPASE, BLOOD     Status: None   Collection Time    08/25/13  9:30 PM      Result Value Range   Lipase 48  11 - 59 U/L  TROPONIN I     Status: None   Collection Time    08/25/13 10:39 PM      Result Value Range   Troponin I <0.30  <0.30 ng/mL   Comment:            Due to the release kinetics of cTnI,     a negative result within the first hours     of the onset of symptoms does not rule out     myocardial infarction with certainty.     If myocardial infarction is still suspected,     repeat the test at appropriate intervals.   Labs are reviewed and are pertinent for BAL 112.  Current Facility-Administered Medications  Medication Dose Route Frequency Provider Last Rate Last Dose  . LORazepam (ATIVAN) tablet 0-4 mg  0-4 mg Oral Q6H Marissa Sciacca, PA-C   1 mg at 08/25/13 2104   Followed by  . [START ON 08/28/2013] LORazepam (ATIVAN) tablet 0-4 mg  0-4 mg Oral Q12H Marissa Sciacca, PA-C   1 mg at 08/26/13 1106  . nicotine (NICODERM CQ - dosed in mg/24 hours) patch 21 mg  21 mg Transdermal Daily Marissa Sciacca, PA-C   21 mg at 08/26/13 1106   Current Outpatient Prescriptions  Medication Sig Dispense Refill  . amLODipine (NORVASC) 10 MG tablet Take 10 mg by mouth daily.      . chlorproMAZINE (THORAZINE) 25 MG tablet Take 25 mg by mouth 3 (three) times daily.      . Cholecalciferol (VITAMIN D PO) Take 1 tablet by mouth daily.      . famotidine (PEPCID) 20 MG tablet Take 1 tablet (20 mg total) by mouth 2 (two) times daily.  30 tablet  0  . metoCLOPramide (REGLAN) 10 MG tablet Take 1 tablet (10  mg total) by mouth 3 (three) times daily.  30 tablet  0  . sertraline (ZOLOFT) 25 MG tablet Take 12.5 mg by mouth daily.      . traZODone (DESYREL) 50 MG tablet Take 50 mg by mouth at bedtime as needed for sleep.        Psychiatric Specialty Exam:     Blood pressure 147/94, pulse 97, temperature 98 F (36.7 C), temperature source Oral, resp. rate 16, SpO2 97.00%.There is no height or weight on file to calculate BMI.  General Appearance: Disheveled  Eye Contact::  Poor  Speech:  Slow  Volume:  Decreased  Mood:  Depressed  Affect:  Depressed  Thought Process:  Goal Directed  Orientation:  Negative  Thought Content:  Hallucinations: Auditory  Suicidal Thoughts:  Yes.  without intent/plan  Homicidal Thoughts:  No  Memory:  Negative  Judgement:  Poor  Insight:  Lacking  Psychomotor Activity:  Decreased  Concentration:  Poor  Recall:  Poor  Akathisia:  Negative  Handed:  Right  AIMS (if indicated):     Assets:  Desire for Improvement  Sleep:      Treatment Plan Summary: Daily contact with patient to assess and evaluate symptoms and progress in treatment Medication management  Pt will be transferred to [facility] for safety/stabilization and detox.  Dereck Leep 08/26/2013 2:34 PM

## 2013-08-26 NOTE — BH Assessment (Signed)
Patient accepted by MD Aneta Mins to [facility]. Attending MD Akintayo, bed assigned 404-1.

## 2013-08-26 NOTE — Progress Notes (Signed)
Pt accepted to [facility] 404-1, by Dr. Aneta Mins to Dr. Darleene Cleaver. Pt to be transported voluntarily by Lucia Estelle, [place]  ED CSW 08/26/2013 1413pm
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
## 2013-08-26 NOTE — ED Notes (Signed)
Call to [facility] for transfer report to 404-1.

## 2013-08-26 NOTE — Tx Team (Signed)
Initial Interdisciplinary Treatment Plan  PATIENT STRENGTHS: (choose at least two) Ability for insight Average or above average intelligence Capable of independent living  PATIENT STRESSORS: Financial difficulties Medication change or noncompliance Substance abuse   PROBLEM LIST: Problem List/Patient Goals Date to be addressed Date deferred Reason deferred Estimated date of resolution  Depression 08/26/13     Substance Abuse 08/26/13                                                DISCHARGE CRITERIA:  Ability to meet basic life and health needs Improved stabilization in mood, thinking, and/or behavior Verbal commitment to aftercare and medication compliance Withdrawal symptoms are absent or subacute and managed without 24-hour nursing intervention  PRELIMINARY DISCHARGE PLAN: Attend aftercare/continuing care group  PATIENT/FAMIILY INVOLVEMENT: This treatment plan has been presented to and reviewed with the patient, [name], and/or family member, .  The patient and family have been given the opportunity to ask questions and make suggestions.  [place], [place] 08/26/2013, 4:16 PM
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
## 2013-08-27 MED ORDER — SULFAMETHOXAZOLE-TMP DS 800-160 MG PO TABS
1.0000 | ORAL_TABLET | Freq: Two times a day (BID) | ORAL | Status: DC
  Administered 2013-08-27 – 2013-09-01 (×10): 1 via ORAL
  Filled 2013-08-27 (×19): qty 1

## 2013-08-27 MED ORDER — HALOPERIDOL 2 MG PO TABS
2.0000 mg | ORAL_TABLET | Freq: Two times a day (BID) | ORAL | Status: DC
  Administered 2013-08-27 – 2013-08-29 (×4): 2 mg via ORAL
  Filled 2013-08-27 (×7): qty 1

## 2013-08-27 MED ORDER — BENZTROPINE MESYLATE 0.5 MG PO TABS
0.5000 mg | ORAL_TABLET | Freq: Two times a day (BID) | ORAL | Status: DC
  Administered 2013-08-27 – 2013-08-29 (×4): 0.5 mg via ORAL
  Filled 2013-08-27 (×7): qty 1

## 2013-08-27 MED ORDER — IBUPROFEN 600 MG PO TABS
600.0000 mg | ORAL_TABLET | Freq: Four times a day (QID) | ORAL | Status: DC | PRN
  Administered 2013-08-27 – 2013-08-28 (×2): 600 mg via ORAL
  Filled 2013-08-27 (×2): qty 1

## 2013-08-27 MED ORDER — FLUOXETINE HCL 20 MG PO CAPS
20.0000 mg | ORAL_CAPSULE | Freq: Every day | ORAL | Status: DC
  Administered 2013-08-27 – 2013-08-29 (×3): 20 mg via ORAL
  Filled 2013-08-27 (×5): qty 1

## 2013-08-27 MED ORDER — LORATADINE 10 MG PO TABS
10.0000 mg | ORAL_TABLET | Freq: Every day | ORAL | Status: DC
  Administered 2013-08-28 – 2013-09-01 (×5): 10 mg via ORAL
  Filled 2013-08-27 (×9): qty 1

## 2013-08-27 MED ORDER — AMLODIPINE BESYLATE 10 MG PO TABS
10.0000 mg | ORAL_TABLET | Freq: Every day | ORAL | Status: DC
  Administered 2013-08-27 – 2013-09-01 (×6): 10 mg via ORAL
  Filled 2013-08-27 (×9): qty 1

## 2013-08-27 NOTE — [facility] Suicide Risk Assessment (Signed)
Suicide Risk Assessment  Admission Assessment     Nursing information obtained from:    Demographic factors:    Current Mental Status:    Loss Factors:    Historical Factors:    Risk Reduction Factors:     CLINICAL FACTORS:   Severe Anxiety and/or Agitation Depression:   Aggression Anhedonia Comorbid alcohol abuse/dependence Hopelessness Impulsivity Insomnia Recent sense of peace/wellbeing Severe Alcohol/Substance Abuse/Dependencies More than one psychiatric diagnosis Unstable or Poor Therapeutic Relationship Previous Psychiatric Diagnoses and Treatments Medical Diagnoses and Treatments/Surgeries  COGNITIVE FEATURES THAT CONTRIBUTE TO RISK:  Closed-mindedness Loss of executive function Polarized thinking Thought constriction (tunnel vision)    SUICIDE RISK:   Moderate:  Frequent suicidal ideation with limited intensity, and duration, some specificity in terms of plans, no associated intent, good self-control, limited dysphoria/symptomatology, some risk factors present, and identifiable protective factors, including available and accessible social support.  PLAN OF CARE: Admit for crisis stabilization, safety monitoring medication management and appropriate case management needs. Patient will receive individual, group and milieu therapy.  I certify that inpatient services furnished can reasonably be expected to improve the patient's condition.   Durward Parcel., M.D. 08/27/2013, 12:28 PM
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
## 2013-08-27 NOTE — ED Provider Notes (Signed)
Medical screening examination/treatment/procedure(s) were performed by non-physician practitioner and as supervising physician I was immediately available for consultation/collaboration.  Richarda Blade, MD 08/27/13 [phone]
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
## 2013-08-27 NOTE — Progress Notes (Signed)
Adult Psychoeducational Group Note  Date:  08/27/2013 Time:  9:01 PM  Group Topic/Focus:  Wrap-Up Group:   The focus of this group is to help patients review their daily goal of treatment and discuss progress on daily workbooks.  Participation Level:  Active  Participation Quality:  Appropriate  Affect:  Appropriate  Cognitive:  Appropriate  Insight: Appropriate  Engagement in Group:  Engaged  Modes of Intervention:  Discussion  Additional Comments: The patient  expressed that his day was ok.The patient expressed that ok meant that his needs were met.  Nash Shearer 08/27/2013, 9:01 PM

## 2013-08-27 NOTE — Progress Notes (Signed)
Patient ID: STRYDER POITRA, male   DOB: 26-Jun-1966, 48 y.o.   MRN: [id number] Psychoeducational Group Note  Date:  08/27/2013 Time:0930am  Group Topic/Focus:  Identifying Needs:   The focus of this group is to help patients identify their personal needs that have been historically problematic and identify healthy behaviors to address their needs.  Participation Level:  Active  Participation Quality:  Appropriate  Affect:  Irritable  Cognitive:  Appropriate  Insight:  Resistant  Engagement in Group:  Resistant  Additional Comments:  Inventory and Psychoeducational group   Pricilla Larsson 08/27/2013,10:28 AM

## 2013-08-27 NOTE — Progress Notes (Signed)
Patient ID: Jacob Adams, male   DOB: June 23, 1966, 47 y.o.   MRN: [id number] 08-27-13 nursing shift note: D: this new admission came to the medication window this am and took his medications. His ciwa was a 5 at 1200 noon. He complained of a headache rated it at 9 was given a ibuprofen 600 mg prn.  his response to the medication was still a 9. He is sitting up in the dayroom and watching television. He interacts minimally with staff.  A: when assessed he stated he is still having some passive SI,  but is able to contract for SI. A: RN and staff have made themselves available to this patient. Staff will continue to monitor and encourage. R: he refused to fill out an inventory sheet. RN will monitor and Q 15 min ck's continue.

## 2013-08-27 NOTE — ED Provider Notes (Signed)
Medical screening examination/treatment/procedure(s) were performed by non-physician practitioner and as supervising physician I was immediately available for consultation/collaboration.  Richarda Blade, MD 08/27/13 [phone]

## 2013-08-28 DIAGNOSIS — F1010 Alcohol abuse, uncomplicated: Secondary | ICD-10-CM

## 2013-08-28 MED ORDER — ENSURE COMPLETE PO LIQD
237.0000 mL | Freq: Two times a day (BID) | ORAL | Status: DC
  Administered 2013-08-29 – 2013-08-31 (×5): 237 mL via ORAL

## 2013-08-28 MED ORDER — SUMATRIPTAN SUCCINATE 50 MG PO TABS
50.0000 mg | ORAL_TABLET | ORAL | Status: DC | PRN
  Administered 2013-08-29 – 2013-08-31 (×4): 50 mg via ORAL
  Filled 2013-08-28 (×4): qty 1

## 2013-08-28 NOTE — Progress Notes (Signed)
Patient ID: Jacob Adams, male   DOB: 1965/12/26, 47 y.o.   MRN: [id number] 08-28-13 nursing shift note: D: pt doesn't not seem to be involved in his care at [facility]. During groups he is sleeping and was not engaged in the group and treatment programming. He is also irritable and defiant.  A: attempted and encouraged pt to participate in the group activities. R: he continues to be resistant to group activity and staff support. On his inventory sheet he wrote: slept fair, appetite improving, attention improving with his depression and hopelessness both at 9. W/d symptoms have tremor, chilling and cravings. Physical problems in the last 24 hrs have been dizziness,headaches and lightheadedness. Voices have decreased. He has on and off SI but is able to contract for the SI.  RN will monitor and Q 15 min ck's continue.

## 2013-08-28 NOTE — Progress Notes (Signed)
D: Pt is pleasant and cooperative. He attended group therapy and participated. He denies SI/HI/AVH.  A: Support given. Verbalization encouraged. Pt encouraged to come to staff with any concerns. Medications given as prescribed. R: Pt is receptive. No complaints of pain or discomfort at this time. Q15 min checks maintained for safety. Will continue to monitor pt.

## 2013-08-28 NOTE — Progress Notes (Signed)
Patient ID: Jacob Adams, male   DOB: 1965-12-06, 47 y.o.   MRN: [id number] Psychoeducational Group Note  Date:  08/28/2013 Time:  0915am  Group Topic/Focus:  Making Healthy Choices:   The focus of this group is to help patients identify negative/unhealthy choices they were using prior to admission and identify positive/healthier coping strategies to replace them upon discharge.  Participation Level:  Minimal  Participation Quality:  Drowsy  Affect:  Lethargic  Cognitive:  Lacking  Insight:  Poor  Engagement in Group:  Poor  Additional Comments:  Inventory and Psychoeducational group   Pricilla Larsson 08/28/2013,9:40 AM

## 2013-08-28 NOTE — Progress Notes (Signed)
Patient ID: Jacob Adams, male   DOB: March 23, 1966, 47 y.o.   MRN: [id number] Psychoeducational Group Note  Date:  08/28/2013 Time:  1515pm  Group Topic/Focus:  Making Healthy Choices:   The focus of this group is to help patients identify negative/unhealthy choices they were using prior to admission and identify positive/healthier coping strategies to replace them upon discharge.  Participation Level:  Active  Participation Quality:  Appropriate  Affect:  Anxious  Cognitive:  Appropriate  Insight:  Supportive  Engagement in Group:  Supportive  Additional Comments:  Psychoeducational group   Pricilla Larsson 08/28/2013,4:32 PM

## 2013-08-28 NOTE — [facility] Group Notes (Signed)
Adult Psychoeducational Group Note  Date:  08/28/2013 Time:  9:20 PM  Group Topic/Focus:  Wrap-Up Group:   The focus of this group is to help patients review their daily goal of treatment and discuss progress on daily workbooks.  Participation Level:  Active  Participation Quality: Appropriate  Affect:  Appropriate  Cognitive: Appropriate  Insight: Appropriate  Engagement in Group:  Appropriate  Modes of Intervention:  Discussion  Additional Comments:  Jacob Adams stated his day was better than yesterday because the voices stopped screaming.  He also had a visit.  He expressed that his support system is yet to be determined.  [name] A 08/28/2013, 9:20 PM

## 2013-08-28 NOTE — Progress Notes (Signed)
Nutrition Brief Note Patient identified on the Malnutrition Screening Tool (MST) Report.  Wt Readings from Last 10 Encounters:  08/26/13 206 lb (93.441 kg)   Body mass index is 31.33 kg/(m^2). Patient meets criteria for obesity based on current BMI.   Discussed intake PTA with patient and compared to intake presently.  Discussed changes in intake, if any, and encouraged adequate intake of meals and snacks. Current diet order is regular and pt is also offered choice of unit snacks mid-morning and mid-afternoon.  Pt is eating as desired.   Labs and medications reviewed.   Nutrition Dx:  Unintended wt change r/t suboptimal oral intake AEB pt report  Pt reports his usual body weight as 235 lbs. This reflects a 12% weight loss. Pt reports that he has been eating well since admission to [facility]. He would like to try Ensure Complete to slow his weight loss.   Interventions:   Discussed the importance of nutrition and encouraged intake of food and beverages.     Discussed weight goals with patient.   Supplements: Ensure Complete po BID, each supplement provides 350 kcal and 13 grams of protein.     If further nutrition issues arise, please consult RD.   Terrace Arabia RD, LDN

## 2013-08-28 NOTE — BHH Group Notes (Signed)
[facility] Group Notes:  (Clinical Social Work)  08/28/2013   11:15am-12:00pm  Summary of Progress/Problems:  The main focus of today's process group was to listen to a variety of genres of music and to identify that different types of music provoke different responses.  The patient then was able to identify personally what was soothing for them, as well as energizing.  Handouts were used to record feelings evoked, as well as how patient can personally use this knowledge in sleep habits, with depression, and with other symptoms.  The patient expressed understanding of concepts, as well as knowledge of how each type of music affected them and how this can be used when they are at home as a tool in their recovery.  Type of Therapy:  Music Therapy   Participation Level:  Active  Participation Quality:  Attentive and Sharing  Affect:  Blunted  Cognitive:  Oriented  Insight:  Engaged  Engagement in Therapy:  Engaged  Modes of Intervention:   Activity, Exploration  Selmer Dominion, LCSW 08/28/2013, 12:30pm
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
## 2013-08-28 NOTE — Progress Notes (Signed)
D: pt is in room. Pt. Stated that he is having some internal conflict with himself. " its me arguing with me. i don't know if it makes much sense, but that's how i feel." pt. Stated that he has had passive SI thoughts throughout the day. Pt. Did verbally contract for safety. Pt. Stated that he always has HI thoughts regarding people " outside these walls that have done me wrong."  Pt. Reports feeling tremors and anxious. Pt. States he has  A new onset of pain in both of his hands 6/10. Denies AVH. A: Support and encouragement given. scheduled and prn meds given. q 15 min safety checks. 1:1 time with pt. R: pt remains safe on unit. Contracts for safety.

## 2013-08-28 NOTE — Progress Notes (Signed)
[facility] MD Progress Note  08/28/2013 12:31 PM Jacob Adams  MRN:  [id number] Subjective:   Patient states "I'm doing a little better but really I'm just here. I'm still very depressed. I'm not hearing as many voices since I started taking medications. It's really hard to focus with this headache going on."   Objective:  Patient presents with very flat affect and depressed mood. The patient is visible on the unit but appears minimally engaged in his treatment. Nursing staff report that he is either sleeping during group or makes makes very defense comments when staff attempt to engage him in the conversation. He continues to report the presence of auditory hallucinations and suicidal thoughts.   Diagnosis:   DSM5: Schizophrenia Disorders:   Obsessive-Compulsive Disorders:   Trauma-Stressor Disorders:   Substance/Addictive Disorders:  Alcohol Related Disorder - Severe (303.90) Depressive Disorders:    AXIS I: Alcohol Abuse and Major Depression, Recurrent severe, with psychosis  AXIS II: Deferred  AXIS III:  Past Medical History   Diagnosis  Date   .  Acid reflux    .  Hypertension    .  Pancreatitis    .  Headache(784.0)    .  Mental disorder    .  Depression     AXIS IV: economic problems, housing problems, occupational problems, other psychosocial or environmental problems and problems with primary support group  AXIS V: 41-50 serious symptoms  ADL's:  Intact  Sleep: Good  Appetite:  Fair  Suicidal Ideation:  Passive SI no plan Homicidal Ideation:  Denies  AEB (as evidenced by):  Psychiatric Specialty Exam: Review of Systems  Constitutional: Negative.   HENT: Positive for ear pain.   Eyes: Negative.   Respiratory: Negative.   Cardiovascular: Negative.   Gastrointestinal: Negative.   Genitourinary: Negative.   Musculoskeletal: Negative.   Skin: Negative.   Neurological: Positive for dizziness and headaches.  Endo/Heme/Allergies: Negative.   Psychiatric/Behavioral:  Positive for depression, suicidal ideas, hallucinations and substance abuse. Negative for memory loss. The patient is nervous/anxious. The patient does not have insomnia.     Blood pressure 139/92, pulse 98, temperature 98.1 F (36.7 C), temperature source Oral, resp. rate 20, height 5' 8"  (1.727 m), weight 93.441 kg (206 lb).Body mass index is 31.33 kg/(m^2).  General Appearance: Casual  Eye Contact::  Fair  Speech:  Clear and Coherent and Slow  Volume:  Decreased  Mood:  Depressed and Hopeless  Affect:  Congruent  Thought Process:  Intact  Orientation:  Full (Time, Place, and Person)  Thought Content:  Hallucinations: Auditory  Suicidal Thoughts:  Yes.  with intent/plan  Homicidal Thoughts:  No  Memory:  Immediate;   Good Recent;   Good Remote;   Good  Judgement:  Poor  Insight:  Shallow  Psychomotor Activity:  Decreased  Concentration:  Fair  Recall:  Good  Akathisia:  No  Handed:  Right  AIMS (if indicated):     Assets:  Communication Skills Desire for Improvement Leisure Time Resilience Talents/Skills  Sleep:  Number of Hours: 6.25   Current Medications: Current Facility-Administered Medications  Medication Dose Route Frequency Provider Last Rate Last Dose  . alum & mag hydroxide-simeth (MAALOX/MYLANTA) 200-200-20 MG/5ML suspension 30 mL  30 mL Oral Q4H PRN Delfin Gant, NP      . amLODipine (NORVASC) tablet 10 mg  10 mg Oral Daily Elmarie Shiley, NP   10 mg at 08/28/13 0821  . benztropine (COGENTIN) tablet 0.5 mg  0.5 mg Oral BID  Elmarie Shiley, NP   0.5 mg at 08/28/13 3212  . chlordiazePOXIDE (LIBRIUM) capsule 25 mg  25 mg Oral Q6H PRN Delfin Gant, NP   25 mg at 08/26/13 1823  . feeding supplement (ENSURE COMPLETE) (ENSURE COMPLETE) liquid 237 mL  237 mL Oral BID BM Dagmar Hait, RD      . FLUoxetine (PROZAC) capsule 20 mg  20 mg Oral Daily Elmarie Shiley, NP   20 mg at 08/28/13 2482  . haloperidol (HALDOL) tablet 2 mg  2 mg Oral BID Elmarie Shiley, NP   2 mg at  08/28/13 5003  . hydrOXYzine (ATARAX/VISTARIL) tablet 25 mg  25 mg Oral Q6H PRN Delfin Gant, NP   25 mg at 08/27/13 2115  . ibuprofen (ADVIL,MOTRIN) tablet 600 mg  600 mg Oral Q6H PRN Elmarie Shiley, NP   600 mg at 08/27/13 1212  . loperamide (IMODIUM) capsule 2-4 mg  2-4 mg Oral PRN Delfin Gant, NP      . loratadine (CLARITIN) tablet 10 mg  10 mg Oral Daily Elmarie Shiley, NP   10 mg at 08/28/13 0828  . magnesium hydroxide (MILK OF MAGNESIA) suspension 30 mL  30 mL Oral Daily PRN Delfin Gant, NP      . multivitamin with minerals tablet 1 tablet  1 tablet Oral Daily Delfin Gant, NP   1 tablet at 08/28/13 0821  . nicotine (NICODERM CQ - dosed in mg/24 hours) patch 21 mg  21 mg Transdermal Daily Delfin Gant, NP   21 mg at 08/28/13 0824  . ondansetron (ZOFRAN-ODT) disintegrating tablet 4 mg  4 mg Oral Q6H PRN Delfin Gant, NP      . sulfamethoxazole-trimethoprim (BACTRIM DS) 800-160 MG per tablet 1 tablet  1 tablet Oral Q12H Elmarie Shiley, NP   1 tablet at 08/28/13 0821  . thiamine (VITAMIN B-1) tablet 100 mg  100 mg Oral Daily Delfin Gant, NP   100 mg at 08/28/13 0821  . traZODone (DESYREL) tablet 50 mg  50 mg Oral QHS PRN,MR X 1 Waldon Merl, MD   50 mg at 08/27/13 2116    Lab Results: No results found for this or any previous visit (from the past 48 hour(s)).  Physical Findings: AIMS: Facial and Oral Movements Muscles of Facial Expression: None, normal Lips and Perioral Area: None, normal Jaw: None, normal Tongue: None, normal,Extremity Movements Upper (arms, wrists, hands, fingers): None, normal Lower (legs, knees, ankles, toes): None, normal, Trunk Movements Neck, shoulders, hips: None, normal, Overall Severity Severity of abnormal movements (highest score from questions above): None, normal Incapacitation due to abnormal movements: None, normal Patient's awareness of abnormal movements (rate only patient's report): No Awareness, Dental  Status Current problems with teeth and/or dentures?: No Does patient usually wear dentures?: No  CIWA:  CIWA-Ar Total: 4 COWS:     Treatment Plan Summary: Daily contact with patient to assess and evaluate symptoms and progress in treatment Medication management  Plan: Continue crisis management and stabilization.  Medication management: Continue Prozac 20 mg daily for depressive symptoms, Haldol 2 mg twice daily for psychosis.  Encouraged patient to attend groups and participate in group counseling sessions and activities.  Discharge plan in progress.  Continue current treatment plan.  Address health issues: Continue Norvasc 10 mg daily for HTN. Order Imitrex 50 mg every two hours max of two doses daily for complaint of headache.   Medical Decision Making Problem Points:  Established problem, stable/improving (1) and  Review of psycho-social stressors (1) Data Points:  Review of medication regiment & side effects (2)  I certify that inpatient services furnished can reasonably be expected to improve the patient's condition.   DAVIS, LAURA NP-C 08/28/2013, 12:31 PM  Reviewed the information documented and agree with the treatment plan.  Wendi Lastra,JANARDHAHA R. 08/28/2013 3:39 PM

## 2013-08-29 DIAGNOSIS — F333 Major depressive disorder, recurrent, severe with psychotic symptoms: Secondary | ICD-10-CM | POA: Diagnosis present

## 2013-08-29 DIAGNOSIS — F1020 Alcohol dependence, uncomplicated: Secondary | ICD-10-CM | POA: Diagnosis present

## 2013-08-29 MED ORDER — HALOPERIDOL 5 MG PO TABS
5.0000 mg | ORAL_TABLET | Freq: Every day | ORAL | Status: DC
  Administered 2013-08-29 – 2013-08-31 (×3): 5 mg via ORAL
  Filled 2013-08-29: qty 1
  Filled 2013-08-29 (×2): qty 14
  Filled 2013-08-29 (×4): qty 1

## 2013-08-29 MED ORDER — NAPROXEN 500 MG PO TABS
500.0000 mg | ORAL_TABLET | Freq: Two times a day (BID) | ORAL | Status: DC
  Administered 2013-08-29 – 2013-09-01 (×6): 500 mg via ORAL
  Filled 2013-08-29 (×12): qty 1

## 2013-08-29 MED ORDER — FLUOXETINE HCL 20 MG PO CAPS
40.0000 mg | ORAL_CAPSULE | Freq: Every day | ORAL | Status: DC
  Administered 2013-08-30 – 2013-09-01 (×3): 40 mg via ORAL
  Filled 2013-08-29 (×3): qty 2
  Filled 2013-08-29: qty 28
  Filled 2013-08-29 (×2): qty 2
  Filled 2013-08-29: qty 28

## 2013-08-29 MED ORDER — HALOPERIDOL 5 MG PO TABS: 5.0000 mg | ORAL_TABLET | Freq: Two times a day (BID) | ORAL | Status: DC

## 2013-08-29 MED ORDER — BENZTROPINE MESYLATE 0.5 MG PO TABS
0.5000 mg | ORAL_TABLET | Freq: Every day | ORAL | Status: DC
  Administered 2013-08-30 – 2013-08-31 (×2): 0.5 mg via ORAL
  Filled 2013-08-29 (×3): qty 1
  Filled 2013-08-29 (×2): qty 14

## 2013-08-29 NOTE — Progress Notes (Signed)
Patient ID: Jacob Adams, male   DOB: May 05, 1966, 47 y.o.   MRN: [id number] [facility] MD Progress Note  08/29/2013 10:58 AM Jacob Adams  MRN:  [id number] Subjective: "I am worry about being homeless." Objective: Patient reports ongoing depressive symptoms characterized by feeling hopeless, helpless, poor concentration, difficulty sleeping, excessive worries and lack of motivation. He continues to endorse intermittent auditory hallucinations and suicidal thoughts. 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 He is compliant with his medications and has not endorsed any adverse reactions. Diagnosis:   DSM5: Schizophrenia Disorders:   Obsessive-Compulsive Disorders:   Trauma-Stressor Disorders:   Substance/Addictive Disorders:  Alcohol Related Disorder - Severe (303.90) Depressive Disorders:    AXIS I: Alcohol use disorder severe.  Major Depression, Recurrent severe, with psychosis  AXIS II: Deferred  AXIS III:  Past Medical History   Diagnosis  Date   .  Acid reflux    .  Hypertension    .  Pancreatitis    .  Headache(784.0)    .  Mental disorder    .  Depression     AXIS IV: economic problems, housing problems, occupational problems, other psychosocial or environmental problems and problems with primary support group  AXIS V: 41-50 serious symptoms  ADL's:  Intact  Sleep: Good  Appetite:  Fair  Suicidal Ideation:  Passive SI no plan Homicidal Ideation:  Denies  AEB (as evidenced by):  Psychiatric Specialty Exam: Review of Systems  Constitutional: Negative.   HENT: Positive for ear pain.   Eyes: Negative.   Respiratory: Negative.   Cardiovascular: Negative.   Gastrointestinal: Negative.   Genitourinary: Negative.   Musculoskeletal: Negative.   Skin: Negative.   Neurological: Positive for dizziness and headaches.  Endo/Heme/Allergies: Negative.   Psychiatric/Behavioral:  Positive for depression, suicidal ideas, hallucinations and substance abuse. Negative for memory loss. The patient is nervous/anxious. The patient does not have insomnia.     Blood pressure 135/91, pulse 83, temperature 98.1 F (36.7 C), temperature source Oral, resp. rate 20, height 5\' 8"  (1.727 m), weight 93.441 kg (206 lb).Body mass index is 31.33 kg/(m^2).  General Appearance: Casual  Eye Contact::  Fair  Speech:  Clear and Coherent and Slow  Volume:  Decreased  Mood:  Depressed and Hopeless  Affect:  Congruent  Thought Process:  Intact  Orientation:  Full (Time, Place, and Person)  Thought Content:  Hallucinations: Auditory  Suicidal Thoughts:  Yes.  with intent/plan  Homicidal Thoughts:  No  Memory:  Immediate;   Good Recent;   Good Remote;   Good  Judgement:  Poor  Insight:  Shallow  Psychomotor Activity:  Decreased  Concentration:  Fair  Recall:  Good  Akathisia:  No  Handed:  Right  AIMS (if indicated):     Assets:  Communication Skills Desire for Improvement Leisure Time Resilience Talents/Skills  Sleep:  Number of Hours: 5.25   Current Medications: Current Facility-Administered Medications  Medication Dose Route Frequency Provider Last Rate Last Dose  . alum & mag hydroxide-simeth (MAALOX/MYLANTA) 200-200-20 MG/5ML suspension 30 mL  30 mL Oral Q4H PRN Earney Navy, NP      . amLODipine (NORVASC) tablet 10 mg  10 mg Oral Daily Fransisca Kaufmann, NP   10 mg at 08/29/13 0753  . benztropine (COGENTIN) tablet 0.5 mg  0.5 mg Oral BID Fransisca Kaufmann, NP   0.5 mg at 08/29/13 0753  .  chlordiazePOXIDE (LIBRIUM) capsule 25 mg  25 mg Oral Q6H PRN Earney Navy, NP   25 mg at 08/26/13 1823  . feeding supplement (ENSURE COMPLETE) (ENSURE COMPLETE) liquid 237 mL  237 mL Oral BID BM Earna Coder, RD   237 mL at 08/29/13 1028  . [START ON 08/30/2013] FLUoxetine (PROZAC) capsule 40 mg  40 mg Oral Daily Taneasha Fuqua      . haloperidol (HALDOL) tablet 5 mg  5 mg Oral BID  Eloyse Causey      . hydrOXYzine (ATARAX/VISTARIL) tablet 25 mg  25 mg Oral Q6H PRN Earney Navy, NP   25 mg at 08/28/13 1957  . loperamide (IMODIUM) capsule 2-4 mg  2-4 mg Oral PRN Earney Navy, NP      . loratadine (CLARITIN) tablet 10 mg  10 mg Oral Daily Fransisca Kaufmann, NP   10 mg at 08/29/13 0753  . magnesium hydroxide (MILK OF MAGNESIA) suspension 30 mL  30 mL Oral Daily PRN Earney Navy, NP      . multivitamin with minerals tablet 1 tablet  1 tablet Oral Daily Earney Navy, NP   1 tablet at 08/29/13 0753  . naproxen (NAPROSYN) tablet 500 mg  500 mg Oral BID WC Endiya Klahr      . nicotine (NICODERM CQ - dosed in mg/24 hours) patch 21 mg  21 mg Transdermal Daily Earney Navy, NP   21 mg at 08/29/13 0751  . ondansetron (ZOFRAN-ODT) disintegrating tablet 4 mg  4 mg Oral Q6H PRN Earney Navy, NP      . sulfamethoxazole-trimethoprim (BACTRIM DS) 800-160 MG per tablet 1 tablet  1 tablet Oral Q12H Fransisca Kaufmann, NP   1 tablet at 08/29/13 0753  . SUMAtriptan (IMITREX) tablet 50 mg  50 mg Oral Q2H PRN Fransisca Kaufmann, NP      . thiamine (VITAMIN B-1) tablet 100 mg  100 mg Oral Daily Earney Navy, NP   100 mg at 08/29/13 0753  . traZODone (DESYREL) tablet 50 mg  50 mg Oral QHS PRN,MR X 1 Larena Sox, MD   50 mg at 08/28/13 2317    Lab Results: No results found for this or any previous visit (from the past 48 hour(s)).  Physical Findings: AIMS: Facial and Oral Movements Muscles of Facial Expression: None, normal Lips and Perioral Area: None, normal Jaw: None, normal Tongue: None, normal,Extremity Movements Upper (arms, wrists, hands, fingers): None, normal Lower (legs, knees, ankles, toes): None, normal, Trunk Movements Neck, shoulders, hips: None, normal, Overall Severity Severity of abnormal movements (highest score from questions above): None, normal Incapacitation due to abnormal movements: None, normal Patient's awareness of abnormal movements  (rate only patient's report): No Awareness, Dental Status Current problems with teeth and/or dentures?: No Does patient usually wear dentures?: No  CIWA:  CIWA-Ar Total: 1 COWS:     Treatment Plan Summary: Daily contact with patient to assess and evaluate symptoms and progress in treatment Medication management  Plan: Continue crisis management and stabilization.  Medication management: Increase  Prozac to 40 mg daily for depressive symptoms and Haldol to 5mg  po Qhs for psychosis.  Encouraged patient to attend groups and participate in group counseling sessions and activities.  Discharge plan in progress.  Continue current treatment plan.  Address health issues: Continue Norvasc 10 mg daily for HTN. Order Imitrex 50 mg every two hours max of two doses daily for complaint of headache.   Medical Decision Making Problem Points:  Established problem, stable/improving (1) and Review of psycho-social stressors (1) Data Points:  Review of medication regiment & side effects (2)  I certify that inpatient services furnished can reasonably be expected to improve the patient's condition.   Thedore Mins, MD 08/29/2013, 10:58 AM

## 2013-08-29 NOTE — Progress Notes (Signed)
Recreation Therapy Notes  Date: 11.10.2014 Time: 9:30am Location: [address] Dayroom   Group Topic: Self-Esteem  Goal Area(s) Addresses:  Patient will define self-esteem.  Patient will identify how self-esteem effects his/her life.   Behavioral Response: Appropraite   Intervention: Air cabin crew.   Activity: Patient was asked to select a question from provided container, provide an answer and given the opportunity to ask a group member to answer the selected question.   Education:  Self-Esteem, Environmental health practitioner, Discharge Planning.   Education Outcome: Needs additional education  Clinical Observations/Feedback: Patient participated, answering selected question appropriately. Patient made no additional statements during group session, it is unclear if patient was actively listening as he was observed to lean head against wall behind him and close eyes.   Laureen Ochs Braiden Rodman, LRT/CTRS  Lane Hacker 08/29/2013 12:42 PM

## 2013-08-29 NOTE — [facility] Group Notes (Signed)
[facility] LCSW Group Therapy  08/29/2013 1:15 pm  Type of Therapy: Process Group Therapy  Participation Level:  Minimal  Participation Quality:  Appropriate  Affect:  Flat  Cognitive:  Oriented  Insight:  Limited  Engagement in Group:  Limited  Engagement in Therapy:  Limited  Modes of Intervention:  Activity, Clarification, Education, Problem-solving and Support  Summary of Progress/Problems: Today's group addressed the issue of overcoming obstacles.  Patients were asked to identify their biggest obstacle post d/c that stands in the way of their on-going success, and then problem solve as to how to manage this.  Jacob Adams presented as detached, withdrawn,  pessimistic.     He states his obstacle is inability to find meaning in life or to want to go on.  In the past, his work and his children have given him meaning and hope, but he no longer works and rarely sees his children, so they are no longer sources of hope.  Despite encouragement and feedback from others about their concerns, he remained steadfast in his outlook.  Jacob Adams 08/29/2013   2:17 PM

## 2013-08-29 NOTE — Progress Notes (Signed)
Date: 08/29/2013  Time: 11:34 AM  Group Topic/Focus:  Wellness Toolbox: The focus of this group is to discuss various aspects of wellness, balancing those aspects and exploring ways to increase the ability to experience wellness. Patients will create a wellness toolbox for use upon discharge.  Participation Level: Active  Participation Quality: Appropriate, Sharing and Supportive  Affect: Appropriate  Cognitive: Appropriate  Insight: Appropriate  Engagement in Group: Engaged and Supportive  Modes of Intervention: Discussion, Education, Problem-solving and Support  Additional Comments: Pt attended group.  Pricilla Larsson M  08/29/2013, 11:34 AM

## 2013-08-29 NOTE — [facility] Group Notes (Signed)
[facility] LCSW Aftercare Discharge Planning Group Note   08/29/2013 8:05 AM  Participation Quality:  Minimal  Mood/Affect:  Depressed and Flat  Depression Rating:  unrated  Anxiety Rating:  unrated  Thoughts of Suicide:  Yes Will you contract for safety?   Yes  Current AVH:  No  Plan for Discharge/Comments:  Jacob Adams states it was his ex-wife's idea for him to come in.  "I was going to hurt myself or someone else."  States he was in the [place] hospital twice since the beginning of the year for same symptoms.  Wants to get back on meds, "do something worthwhile.Marland KitchenMarland KitchenI don't know."  Transportation Means:  unk  Supports: unk  Martorell, Woodward B

## 2013-08-29 NOTE — [facility] Group Notes (Signed)
Adult Psychoeducational Group Note  Date:  08/29/2013 Time:  9:44 PM  Group Topic/Focus:  Wrap-Up Group:   The focus of this group is to help patients review their daily goal of treatment and discuss progress on daily workbooks.  Participation Level:  Active  Participation Quality:  Appropriate  Affect:  Appropriate  Cognitive:  Appropriate  Insight: Appropriate  Engagement in Group:  Engaged  Modes of Intervention:  Discussion  Additional Comments:  Eligha said his day was okay. He didn't hurt himself or anyone else and the voices have gotten quieter.  His coping skill is to ignore things or walk away.  [name] A 08/29/2013, 9:44 PM
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
## 2013-08-29 NOTE — Progress Notes (Signed)
D: Pt presents with depressed affect and mood. Pt c/o of AH telling him he is worthless. Pt denies SI/HI. Pt presents with decreased withdrawal symptoms. Pt have mild hand tremors. Pt reports feeling depressed today. Pt compliant with taking meds and attending groups. A: Medications administered as ordered per MD. Verbal support given. Pt encouraged to attend groups. 15 minute checks performed for safety. R: Pt safety maintained.

## 2013-08-30 MED ORDER — GABAPENTIN 100 MG PO CAPS
200.0000 mg | ORAL_CAPSULE | Freq: Two times a day (BID) | ORAL | Status: DC
  Administered 2013-08-30 – 2013-09-01 (×4): 200 mg via ORAL
  Filled 2013-08-30: qty 56
  Filled 2013-08-30: qty 2
  Filled 2013-08-30: qty 56
  Filled 2013-08-30 (×2): qty 2
  Filled 2013-08-30: qty 56
  Filled 2013-08-30 (×2): qty 2
  Filled 2013-08-30: qty 56
  Filled 2013-08-30: qty 2

## 2013-08-30 MED ORDER — TRAZODONE HCL 100 MG PO TABS
100.0000 mg | ORAL_TABLET | Freq: Every evening | ORAL | Status: DC | PRN
  Administered 2013-08-30 – 2013-08-31 (×2): 100 mg via ORAL
  Filled 2013-08-30 (×2): qty 1
  Filled 2013-08-30: qty 28

## 2013-08-30 NOTE — Progress Notes (Signed)
Adult Psychoeducational Group Note  Date:  08/30/2013 Time:  9:49 PM  Group Topic/Focus:  Wrap-Up Group:   The focus of this group is to help patients review their daily goal of treatment and discuss progress on daily workbooks.  Participation Level:  Minimal  Participation Quality:  Appropriate  Affect:  Flat  Cognitive:  Appropriate  Insight: Good  Engagement in Group:  Engaged  Modes of Intervention:  Support  Additional Comments:  Patient attended and participated in group tonight. He reports having a good day. He socialized with peers, went to his groups and meals. He talked a lot today. He advised that this was his best day since being in the hospital. Today he had a visit with his girlfriend.  Salley Scarlet [facility] 08/30/2013, 9:49 PM
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
## 2013-08-30 NOTE — Progress Notes (Signed)
Adult Psychoeducational Group Note  Date:  08/30/2013 Time:  9:30AM Group Topic/Focus:  Recovery Goals:   The focus of this group is to identify appropriate goals for recovery and establish a plan to achieve them.  Participation Level:  Active  Participation Quality:  Appropriate and Attentive  Affect:  Appropriate  Cognitive:  Alert and Appropriate  Insight: Appropriate  Engagement in Group:  Engaged  Modes of Intervention:  Discussion  Additional Comments:  Pt. Was attentive and appropriate during today's group discussion. Pt identified recovery as improving his communication with supports and explaining why he is isolating hisself. Pt shared that he is willing to put in the work to improve self care.   Theodoro Grist D 08/30/2013, 11:18 AM

## 2013-08-30 NOTE — Progress Notes (Signed)
D   Pt is pleasant and appropriate   He interacts well with others and attends and participates in groups  Pt reports wanting long term treatment for ETOH abuse   Pt was instructed to let staff know if he has any trouble sleeping tonight  A    Verbal support given  Medications administered and effectiveness monitored   Q 15 min checks R   Pt safe at present

## 2013-08-30 NOTE — Progress Notes (Signed)
D   Pt has been visible in the dayroom and interacting with select peers   He denies suicidal ideation presently but does contract for safety while on the unit    He attends and participates in group  A   Verbal support given   Medications administered and effectiveness monitored   Q 15 min checks R   Pt safe at present

## 2013-08-30 NOTE — Progress Notes (Signed)
Patient ID: Jacob Adams, male   DOB: 1965-11-14, 47 y.o.   MRN: [id number]  [facility] MD Progress Note  08/30/2013 3:50 PM Jacob Adams  MRN:  [id number] Subjective: Patient states "The depression is still there. I am still hearing voices, but only one now. It says horrible things like I should have just ended it all. I'm still having really bad headaches. It's hard to focus because of all of this."   Objective: Patient reports ongoing depressive symptoms characterized by feeling hopeless, helpless, poor concentration, difficulty sleeping, excessive worries and lack of motivation. He continues to endorse intermittent auditory hallucinations and suicidal thoughts. Patient does report a small improvement in his depressive symptoms. The patient appears to be easier to engage in conversation today and his affective is more expressive than on admission. Patient is reporting a decrease in hallucinations stating "It was many voices but now just one."   Diagnosis:   DSM5: Schizophrenia Disorders:   Obsessive-Compulsive Disorders:   Trauma-Stressor Disorders:   Substance/Addictive Disorders:  Alcohol Related Disorder - Severe (303.90) Depressive Disorders:    AXIS I: Alcohol use disorder severe.  Major Depression, Recurrent severe, with psychosis  AXIS II: Deferred  AXIS III:  Past Medical History   Diagnosis  Date   .  Acid reflux    .  Hypertension    .  Pancreatitis    .  Headache(784.0)    .  Mental disorder    .  Depression     AXIS IV: economic problems, housing problems, occupational problems, other psychosocial or environmental problems and problems with primary support group  AXIS V: 41-50 serious symptoms  ADL's:  Intact  Sleep: Good  Appetite:  Fair  Suicidal Ideation:  Passive SI no plan Homicidal Ideation:  Denies  AEB (as evidenced by):  Psychiatric Specialty Exam: Review of Systems  Constitutional: Negative.   HENT: Positive for ear pain.   Eyes: Negative.    Respiratory: Negative.   Cardiovascular: Negative.   Gastrointestinal: Negative.   Genitourinary: Negative.   Musculoskeletal: Negative.   Skin: Negative.   Neurological: Positive for dizziness and headaches.  Endo/Heme/Allergies: Negative.   Psychiatric/Behavioral: Positive for depression, suicidal ideas, hallucinations and substance abuse. Negative for memory loss. The patient is nervous/anxious. The patient does not have insomnia.     Blood pressure 119/83, pulse 89, temperature 97.5 F (36.4 C), temperature source Oral, resp. rate 20, height 5\' 8"  (1.727 m), weight 93.441 kg (206 lb).Body mass index is 31.33 kg/(m^2).  General Appearance: Casual  Eye Contact::  Fair  Speech:  Clear and Coherent and Slow  Volume:  Decreased  Mood:  Depressed and Hopeless  Affect:  Congruent  Thought Process:  Intact  Orientation:  Full (Time, Place, and Person)  Thought Content:  Hallucinations: Auditory  Suicidal Thoughts:  Yes.  with intent/plan  Homicidal Thoughts:  No  Memory:  Immediate;   Good Recent;   Good Remote;   Good  Judgement:  Poor  Insight:  Shallow  Psychomotor Activity:  Decreased  Concentration:  Fair  Recall:  Good  Akathisia:  No  Handed:  Right  AIMS (if indicated):     Assets:  Communication Skills Desire for Improvement Leisure Time Resilience Talents/Skills  Sleep:  Number of Hours: 5.75   Current Medications: Current Facility-Administered Medications  Medication Dose Route Frequency Provider Last Rate Last Dose  . alum & mag hydroxide-simeth (MAALOX/MYLANTA) 200-200-20 MG/5ML suspension 30 mL  30 mL Oral Q4H PRN Earney Navy, NP      .  amLODipine (NORVASC) tablet 10 mg  10 mg Oral Daily Fransisca Kaufmann, NP   10 mg at 08/30/13 0802  . benztropine (COGENTIN) tablet 0.5 mg  0.5 mg Oral QHS Mojeed Akintayo      . feeding supplement (ENSURE COMPLETE) (ENSURE COMPLETE) liquid 237 mL  237 mL Oral BID BM Earna Coder, RD   237 mL at 08/30/13 1046  .  FLUoxetine (PROZAC) capsule 40 mg  40 mg Oral Daily Mojeed Akintayo   40 mg at 08/30/13 0802  . gabapentin (NEURONTIN) capsule 200 mg  200 mg Oral BID Fransisca Kaufmann, NP      . haloperidol (HALDOL) tablet 5 mg  5 mg Oral QHS Mojeed Akintayo   5 mg at 08/29/13 2230  . loratadine (CLARITIN) tablet 10 mg  10 mg Oral Daily Fransisca Kaufmann, NP   10 mg at 08/30/13 0802  . magnesium hydroxide (MILK OF MAGNESIA) suspension 30 mL  30 mL Oral Daily PRN Earney Navy, NP      . multivitamin with minerals tablet 1 tablet  1 tablet Oral Daily Earney Navy, NP   1 tablet at 08/30/13 0801  . naproxen (NAPROSYN) tablet 500 mg  500 mg Oral BID WC Mojeed Akintayo   500 mg at 08/30/13 0801  . nicotine (NICODERM CQ - dosed in mg/24 hours) patch 21 mg  21 mg Transdermal Daily Earney Navy, NP   21 mg at 08/30/13 0802  . sulfamethoxazole-trimethoprim (BACTRIM DS) 800-160 MG per tablet 1 tablet  1 tablet Oral Q12H Fransisca Kaufmann, NP   1 tablet at 08/30/13 0801  . SUMAtriptan (IMITREX) tablet 50 mg  50 mg Oral Q2H PRN Fransisca Kaufmann, NP   50 mg at 08/30/13 1048  . thiamine (VITAMIN B-1) tablet 100 mg  100 mg Oral Daily Earney Navy, NP   100 mg at 08/30/13 0801  . traZODone (DESYREL) tablet 100 mg  100 mg Oral QHS PRN,MR X 1 Mojeed Akintayo        Lab Results: No results found for this or any previous visit (from the past 48 hour(s)).  Physical Findings: AIMS: Facial and Oral Movements Muscles of Facial Expression: None, normal Lips and Perioral Area: None, normal Jaw: None, normal Tongue: None, normal,Extremity Movements Upper (arms, wrists, hands, fingers): None, normal Lower (legs, knees, ankles, toes): None, normal, Trunk Movements Neck, shoulders, hips: None, normal, Overall Severity Severity of abnormal movements (highest score from questions above): None, normal Incapacitation due to abnormal movements: None, normal Patient's awareness of abnormal movements (rate only patient's report): No  Awareness, Dental Status Current problems with teeth and/or dentures?: No Does patient usually wear dentures?: No  CIWA:  CIWA-Ar Total: 1 COWS:     Treatment Plan Summary: Daily contact with patient to assess and evaluate symptoms and progress in treatment Medication management  Plan: Continue crisis management and stabilization.  Medication management: Continue Prozac to 40 mg daily for depressive symptoms and Haldol to 5mg  po Qhs for psychosis. Start Neurontin 200 mg BID to address complaints of headache, pain, and anxiety.  Encouraged patient to attend groups and participate in group counseling sessions and activities.  Discharge plan in progress.  Continue current treatment plan. Anticipate d/c on Friday.  Address health issues: Vitals reviewed and stable.   Medical Decision Making Problem Points:  Established problem, stable/improving (1) and Review of psycho-social stressors (1) Data Points:  Review of medication regiment & side effects (2)  I certify that inpatient services furnished can reasonably  be expected to improve the patient's condition.   Fransisca Kaufmann, NP-C 08/30/2013, 3:50 PM

## 2013-08-30 NOTE — B[facility] Group Notes (Signed)
[place] LCSW Group Therapy  08/30/2013 , 12:57 PM   Type of Therapy:  Group Therapy  Participation Level:  Active  Participation Quality:  Attentive  Affect:  Appropriate  Cognitive:  Alert  Insight:  Improving  Engagement in Therapy:  Engaged  Modes of Intervention:  Discussion, Exploration and Socialization  Summary of Progress/Problems: Today's group focused on the term Diagnosis.  Participants were asked to define the term, and then pronounce whether it is a negative, positive or neutral term.  Juandiego embraced both his diagnosis of substance abuse and mental health.  Having both, he does not feel that one is either more positive or negative than the other.  He spoke of relapse, and how for him recovery is both an event and a process.  Roque Lias B 08/30/2013 , 12:57 PM

## 2013-08-31 NOTE — [facility] Group Notes (Signed)
Adult Psychoeducational Group Note  Date:  08/31/2013 Time:  9:03 PM  Group Topic/Focus:  Wrap-Up Group:   The focus of this group is to help patients review their daily goal of treatment and discuss progress on daily workbooks.  Participation Level:  Active  Participation Quality:  Appropriate  Affect:  Appropriate  Cognitive:  Appropriate  Insight: Appropriate  Engagement in Group:  Engaged  Modes of Intervention:  Discussion  Additional Comments:  Jacob Adams said his day was good.  He stated he got to co-lead a group.  The voices were less in his head today and he went outside.  His goal is to find somewhere to go.  [name] A 08/31/2013, 9:03 PM

## 2013-08-31 NOTE — Progress Notes (Signed)
Patient ID: Jacob Adams, male   DOB: [birth year], 47 y.o.   MRN: [id number] [facility] MD Progress Note  08/31/2013 10:12 AM Jacob Adams  MRN:  [id number] Subjective: "I am no longer hearing voices but I am still feeling depress." Objective: Patient reports feeling depressed and hopeless for being homeless. He denies feeling suicidal, auditory or visual hallucinations but reports ongoing worries about his current situation. He denies craving for alcohol and hope to get to [facility] or half way house upon discharge. He will be interviewed for half way house placement today. Patient is compliant with his medication and has not endorsed any adverse reactions. Diagnosis:   DSM5: Schizophrenia Disorders:   Obsessive-Compulsive Disorders:   Trauma-Stressor Disorders:   Substance/Addictive Disorders:  Alcohol Related Disorder - Severe (303.90) Depressive Disorders:    AXIS I: Alcohol use disorder severe.  Major Depression, Recurrent severe, with psychosis  AXIS II: Deferred  AXIS III:  Past Medical History   Diagnosis  Date   .  Acid reflux    .  Hypertension    .  Pancreatitis    .  Headache(784.0)    .  Mental disorder    .  Depression     AXIS IV: economic problems, housing problems, occupational problems, other psychosocial or environmental problems and problems with primary support group  AXIS V: 41-50 serious symptoms  ADL's:  Intact  Sleep: Good  Appetite:  Fair  Suicidal Ideation:  Passive SI no plan Homicidal Ideation:  Denies  AEB (as evidenced by):  Psychiatric Specialty Exam: Review of Systems  Constitutional: Negative.   HENT: Positive for ear pain.   Eyes: Negative.   Respiratory: Negative.   Cardiovascular: Negative.   Gastrointestinal: Negative.   Genitourinary: Negative.   Musculoskeletal: Negative.   Skin: Negative.   Neurological: Positive for dizziness and headaches.  Endo/Heme/Allergies: Negative.   Psychiatric/Behavioral: Positive for depression and  substance abuse. Negative for memory loss. The patient is nervous/anxious. The patient does not have insomnia.     Blood pressure 126/82, pulse 88, temperature 98.7 F (37.1 C), temperature source Oral, resp. rate 18, height 5\' 8"  (1.727 m), weight 93.441 kg (206 lb).Body mass index is 31.33 kg/(m^2).  General Appearance: Casual  Eye Contact::  Fair  Speech:  Clear and Coherent and Slow  Volume:  Decreased  Mood:  Depressed and Hopeless  Affect:  Congruent  Thought Process:  Intact  Orientation:  Full (Time, Place, and Person)  Thought Content:  Reality based  Suicidal Thoughts:  denies  Homicidal Thoughts:  No  Memory:  Immediate;   Good Recent;   Good Remote;   Good  Judgement:  marginal  Insight:  marginal  Psychomotor Activity:  Decreased  Concentration:  Fair  Recall:  Good  Akathisia:  No  Handed:  Right  AIMS (if indicated):     Assets:  Communication Skills Desire for Improvement Leisure Time Resilience Talents/Skills  Sleep:  Number of Hours: 6   Current Medications: Current Facility-Administered Medications  Medication Dose Route Frequency Provider Last Rate Last Dose  . alum & mag hydroxide-simeth (MAALOX/MYLANTA) 200-200-20 MG/5ML suspension 30 mL  30 mL Oral Q4H PRN Earney Navy, NP      . amLODipine (NORVASC) tablet 10 mg  10 mg Oral Daily Fransisca Kaufmann, NP   10 mg at 08/31/13 0747  . benztropine (COGENTIN) tablet 0.5 mg  0.5 mg Oral QHS Tanai Bouler   0.5 mg at 08/30/13 2227  . feeding supplement (  ENSURE COMPLETE) (ENSURE COMPLETE) liquid 237 mL  237 mL Oral BID BM Earna Coder, RD   237 mL at 08/30/13 1046  . FLUoxetine (PROZAC) capsule 40 mg  40 mg Oral Daily Kylyn Mcdade   40 mg at 08/31/13 0746  . gabapentin (NEURONTIN) capsule 200 mg  200 mg Oral BID Fransisca Kaufmann, NP   200 mg at 08/31/13 0746  . haloperidol (HALDOL) tablet 5 mg  5 mg Oral QHS Taylyn Brame   5 mg at 08/30/13 2227  . loratadine (CLARITIN) tablet 10 mg  10 mg Oral Daily Fransisca Kaufmann, NP   10 mg at 08/31/13 0746  . magnesium hydroxide (MILK OF MAGNESIA) suspension 30 mL  30 mL Oral Daily PRN Earney Navy, NP      . multivitamin with minerals tablet 1 tablet  1 tablet Oral Daily Earney Navy, NP   1 tablet at 08/31/13 0747  . naproxen (NAPROSYN) tablet 500 mg  500 mg Oral BID WC Sawsan Riggio   500 mg at 08/31/13 0747  . nicotine (NICODERM CQ - dosed in mg/24 hours) patch 21 mg  21 mg Transdermal Daily Earney Navy, NP   21 mg at 08/31/13 0747  . sulfamethoxazole-trimethoprim (BACTRIM DS) 800-160 MG per tablet 1 tablet  1 tablet Oral Q12H Fransisca Kaufmann, NP   1 tablet at 08/31/13 0747  . SUMAtriptan (IMITREX) tablet 50 mg  50 mg Oral Q2H PRN Fransisca Kaufmann, NP   50 mg at 08/30/13 2227  . thiamine (VITAMIN B-1) tablet 100 mg  100 mg Oral Daily Earney Navy, NP   100 mg at 08/31/13 0747  . traZODone (DESYREL) tablet 100 mg  100 mg Oral QHS PRN,MR X 1 Chaundra Abreu   100 mg at 08/30/13 2227    Lab Results: No results found for this or any previous visit (from the past 48 hour(s)).  Physical Findings: AIMS: Facial and Oral Movements Muscles of Facial Expression: None, normal Lips and Perioral Area: None, normal Jaw: None, normal Tongue: None, normal,Extremity Movements Upper (arms, wrists, hands, fingers): None, normal Lower (legs, knees, ankles, toes): None, normal, Trunk Movements Neck, shoulders, hips: None, normal, Overall Severity Severity of abnormal movements (highest score from questions above): None, normal Incapacitation due to abnormal movements: None, normal Patient's awareness of abnormal movements (rate only patient's report): No Awareness, Dental Status Current problems with teeth and/or dentures?: No Does patient usually wear dentures?: No  CIWA:  CIWA-Ar Total: 0 COWS:     Treatment Plan Summary: Daily contact with patient to assess and evaluate symptoms and progress in treatment Medication management  Plan: Continue  crisis management and stabilization.  Medication management: Continue Prozac to 40 mg daily for depressive symptoms and Haldol to 5mg  po Qhs for psychosis.  Encouraged patient to attend groups and participate in group counseling sessions and activities.  Discharge plan in progress.  Continue current treatment plan.  Address health issues: Continue Norvasc 10 mg daily for HTN. Order Imitrex 50 mg every two hours max of two doses daily for complaint of headache.   Medical Decision Making Problem Points:  Established problem, stable/improving (1) and Review of psycho-social stressors (1) Data Points:  Review of medication regiment & side effects (2)  I certify that inpatient services furnished can reasonably be expected to improve the patient's condition.   Thedore Mins, MD 08/31/2013, 10:12 AM

## 2013-08-31 NOTE — Progress Notes (Signed)
Adult Psychoeducational Group Note  Date:  08/31/2013 Time:  11:00AM Group Topic/Focus:  Personal Development  Participation Level:  Active  Participation Quality:  Appropriate and Attentive  Affect:  Appropriate  Cognitive:  Alert and Appropriate  Insight: Appropriate  Engagement in Group:  Engaged  Modes of Intervention:  Discussion  Additional Comments:  Pt. Was attentive and appropriate during today's group discussion. Pt. Was able to discuss making changes assignment and values. Pt. Shared how he is going to improve on communication and reducing isolation. Pt. Stated that some of the things he do to reduce stress is go for long walks and interact with grand children.   Theodoro Grist D 08/31/2013, 2:49 PM

## 2013-08-31 NOTE — [facility] Group Notes (Signed)
[place] Group Notes:  (Nursing/MHT/Case Management/Adjunct)  Date:  08/31/2013 Time:  2:30 PM  Type of Therapy:  Group Therapy   Participation Level:  Active  Participation Quality:  Appropriate  Affect:  Appropriate  Cognitive:  Appropriate  Insight:  Improving  Engagement in Group:  Engaged  Modes of Intervention: Discussion, Exploration and Socialization   Summary of Progress/Problems:  The topic for group was coping skills. Pt participated in the discussion about when coping skills are needed and what coping skills they have used in their life.  Nadia identified that he isolates himself when he cannot cope with stressors.  He identified different coping skills such as being with positive people, camping, reading the Koran, praying, and screaming.  He stated that mediation would help ease stress, but does not like to practice it himself.  He shared an experience with his past counselor, Heidi, who helped him  decrease irrational thoughts.  He also indicated that he would give back to the community when he had a more stable income.     Cassandria Santee MSW Intern  08/31/2013 2:30 PM

## 2013-08-31 NOTE — Progress Notes (Signed)
Recreation Therapy Notes  Date: 11.12.2014 Time: 9:30am Location: [address] Dayroom  Group Topic: Leisure Education  Goal Area(s) Addresses:  Patient will identify positive leisure/recreation activities. Patient will identify benefit of positive leisure and recreation.   Behavioral Response: Did not attend.   Laureen Ochs Kru Allman, LRT/CTRS  Lane Hacker 08/31/2013 4:53 PM

## 2013-08-31 NOTE — Progress Notes (Signed)
Patient ID: Jacob Adams, male   DOB: 12/06/1965, 47 y.o.   MRN: [id number] D: Pt denies SI/HI. Pt reports decreased AH. Pt c/o migraine this afternoon and received prn med for pain. No adverse reaction to meds verbalized by pt. Pt compliant with taking meds and attending groups. No withdrawal symptoms noted. A: Medications administered as ordered per MD. Verbal support given. Pt encouraged to attend groups. 15 minute checks performed for safety. R: Pt receptive to treatment.

## 2013-08-31 NOTE — Progress Notes (Signed)
Patient ID: Jacob Adams, male   DOB: 1965/11/21, 47 y.o.   MRN: [id number] D: Pt is awake and active on the unit this PM. Pt endorses occasional AH but denies suicidality. Pt mood is depressed and his affect is flat, but he is participating in the milieu and is cooperative with staff.   A: Encouraged pt to discuss feelings with staff and administered medication per MD orders. Writer also encouraged pt to participate in groups.  R: Pt is attending groups and tolerating medications well. Writer will continue to monitor. 15 minute checks are ongoing for safety.

## 2013-09-01 DIAGNOSIS — F333 Major depressive disorder, recurrent, severe with psychotic symptoms: Principal | ICD-10-CM

## 2013-09-01 DIAGNOSIS — F1020 Alcohol dependence, uncomplicated: Secondary | ICD-10-CM

## 2013-09-01 MED ORDER — TRAZODONE HCL 100 MG PO TABS: 100.0000 mg | ORAL_TABLET | Freq: Every evening | ORAL | Status: DC | PRN

## 2013-09-01 MED ORDER — GABAPENTIN 100 MG PO CAPS: 200.0000 mg | ORAL_CAPSULE | Freq: Two times a day (BID) | ORAL | Status: DC

## 2013-09-01 MED ORDER — BENZTROPINE MESYLATE 0.5 MG PO TABS: 0.5000 mg | ORAL_TABLET | Freq: Every day | ORAL | Status: DC

## 2013-09-01 MED ORDER — AMLODIPINE BESYLATE 10 MG PO TABS: 10.0000 mg | ORAL_TABLET | Freq: Every day | ORAL | Status: DC

## 2013-09-01 MED ORDER — METOCLOPRAMIDE HCL 10 MG PO TABS: 10.0000 mg | ORAL_TABLET | Freq: Three times a day (TID) | ORAL | Status: DC

## 2013-09-01 MED ORDER — FAMOTIDINE 20 MG PO TABS: 20.0000 mg | ORAL_TABLET | Freq: Two times a day (BID) | ORAL | Status: DC

## 2013-09-01 MED ORDER — HALOPERIDOL 5 MG PO TABS: 5.0000 mg | ORAL_TABLET | Freq: Every day | ORAL | Status: DC

## 2013-09-01 MED ORDER — FLUOXETINE HCL 40 MG PO CAPS: 40.0000 mg | ORAL_CAPSULE | Freq: Every day | ORAL | Status: DC

## 2013-09-01 NOTE — Progress Notes (Signed)
[facility] Adult Case Management Discharge Plan :  Will you be returning to the same living situation after discharge: No. At discharge, do you have transportation home?:Yes,  bus pass Do you have the ability to pay for your medications:Yes,  mental health  Release of information consent forms completed and in the chart;  Patient's signature needed at discharge.  Patient to Follow up at: Follow-up Information   Follow up with [facility] On 09/02/2013. (Arrive no later than 8AM for your screening appointment.  They will give you a date for admission.  Make sure you go to admission with no less than 14 day supply of medication-preferably 30 day supply.)    Contact information:   [place] [phone]      Follow up with [facility]. (Go to the walk-in clinic M-F between 8 and 9AM for your hospital follow up appointment.  this is where you will see a psychiatrist and can get medication in their pharmacy.)    Contact information:   [place] [phone]      Patient denies SI/HI:   Yes,  yes    Safety Planning and Suicide Prevention discussed:  Yes,  yes  Trish Mage 09/01/2013, 9:42 AM

## 2013-09-01 NOTE — [facility] Suicide Risk Assessment (Signed)
[place] INPATIENT:  Family/Significant Other Suicide Prevention Education  Suicide Prevention Education:  Education Completed; Jacob Adams, wife, [phone] has been identified by the patient as the family member/significant other with whom the patient will be residing, and identified as the person(s) who will aid the patient in the event of a mental health crisis (suicidal ideations/suicide attempt).  With written consent from the patient, the family member/significant other has been provided the following suicide prevention education, prior to the and/or following the discharge of the patient.  The suicide prevention education provided includes the following:  Suicide risk factors  Suicide prevention and interventions  National Suicide Hotline telephone number  [facility] assessment telephone number  [facility] Emergency Assistance [facility] and/or Residential Mobile Crisis Unit telephone number  Request made of family/significant other to:  Remove weapons (e.g., guns, rifles, knives), all items previously/currently identified as safety concern.    Remove drugs/medications (over-the-counter, prescriptions, illicit drugs), all items previously/currently identified as a safety concern.  The family member/significant other verbalizes understanding of the suicide prevention education information provided.  The family member/significant other agrees to remove the items of safety concern listed above.  Jacob Adams 09/01/2013, 10:58 AM

## 2013-09-01 NOTE — Tx Team (Signed)
  Interdisciplinary Treatment Plan Update   Date Reviewed:  09/01/2013  Time Reviewed:  9:40 AM  Progress in Treatment:   Attending groups: Yes Participating in groups: Yes Taking medication as prescribed: Yes  Tolerating medication: Yes Family/Significant other contact made: Yes  Patient understands diagnosis: Yes  Discussing patient identified problems/goals with staff: Yes Medical problems stabilized or resolved: Yes Denies suicidal/homicidal ideation: Yes Patient has not harmed self or others: Yes  For review of initial/current patient goals, please see plan of care.  Estimated Length of Stay:  D/C today  Reason for Continuation of Hospitalization:   New Problems/Goals identified:  N/A  Discharge Plan or Barriers:   stay with sister until he can get into [facility] rehab  Additional Comments:  Attendees:  Signature: Corena Pilgrim, MD 09/01/2013 9:40 AM   Signature: Ripley Fraise, LCSW 09/01/2013 9:40 AM  Signature: Elmarie Shiley, NP 09/01/2013 9:40 AM  Signature: Mayra Neer, RN 09/01/2013 9:40 AM  Signature: Darrol Angel, RN 09/01/2013 9:40 AM  Signature:  09/01/2013 9:40 AM  Signature:   09/01/2013 9:40 AM  Signature:    Signature:    Signature:    Signature:    Signature:    Signature:      Scribe for Treatment Team:   Ripley Fraise, LCSW  09/01/2013 9:40 AM

## 2013-09-01 NOTE — Progress Notes (Signed)
Seen and agreed. Corena Pilgrim, MD

## 2013-09-01 NOTE — Discharge Summary (Signed)
Physician Discharge Summary Note  Patient:  Jacob Adams is an 47 y.o., male MRN:  [id number] DOB:  November 22, 1965 Patient phone:  [phone] (home)  Patient address:   [address] [place] [address],   Date of Admission:  08/26/2013 Date of Discharge: 09/01/13  Reason for Admission:  Depression with SI, Alcohol Abuse   Discharge Diagnoses: Principal Problem:   Major depressive disorder, recurrent episode, severe, specified as with psychotic behavior Active Problems:   Alcohol dependence  Review of Systems  Constitutional: Negative.   HENT: Negative.   Eyes: Negative.   Respiratory: Negative.   Cardiovascular: Negative.   Gastrointestinal: Negative.   Genitourinary: Negative.   Musculoskeletal: Negative.   Skin: Negative.   Neurological: Negative.   Endo/Heme/Allergies: Negative.   Psychiatric/Behavioral: Negative for depression, suicidal ideas, hallucinations, memory loss and substance abuse. The patient is not nervous/anxious and does not have insomnia.     DSM5: AXIS I: Major depressive disorder, recurrent episode, severe, specified as with psychotic behavior  Alcohol use disorder severe  AXIS II: Deferred  AXIS III:  Past Medical History   Diagnosis  Date   .  Acid reflux    .  Hypertension    .  Pancreatitis    .  Headache(784.0)    AXIS IV: economic problems, housing problems, problems related to social environment and problems with primary support group  AXIS V: 61-70 mild symptoms  Level of Care:  OP  Hospital Course:  This is a 47 year old male admitted voluntarily for complaints of increased depression, anxiety and suicidal ideation and alcohol abuse to [facility] Patient states "I'm here because I tried to hurt myself. I kept drinking alcohol on purpose and I guess I blacked out. I was hoping to die. The police found me on the corner. 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 I broke up with my wife many years ago. Now I am homeless.  I have been hearing voices calling my name. I'm just so depressed. I think I just gave up on myself. If the building was on fire right now I would not even try to move so maybe I could just die. I've also been having panic attacks. I know every ED around because I'm so afraid of what might happen to me. I have lost weight. Probably thirty pounds in a month." The patient appears extremely depressed and despondent during the admission assessment.          Jacob Adams was admitted to the adult unit. He was evaluated and his symptoms were identified. Medication management was discussed and initiated. Patient was started on Prozac for depression and Haldol for psychosis. He was oriented to the unit and encouraged to participate in unit programming. Medical problems were identified and treated appropriately. Patient complained of ear pain and after beign evaluated by NP was treated with course of antibiotics for otitis media. He complained of a constant headache which did not respond to imitrex. The patient reported improvement in headache after being started on neurontin. Home medication was restarted as needed.        The patient was evaluated each day by a clinical provider to ascertain the patient's response to treatment.  Improvement was noted by the patient's report of decreasing symptoms, improved sleep and appetite, affect, medication tolerance, behavior, and participation in unit programming.  Jacob Adams was asked each day to complete a self inventory noting mood, mental status, pain, new symptoms, anxiety and concerns.  Patient requested to get help with his alcohol problem and a referral was made for treatment.          He responded well to medication and being in a therapeutic and supportive environment. Positive and appropriate behavior was noted and the patient was motivated for recovery.  Jacob Adams worked closely with the treatment team and case manager to develop a discharge plan with appropriate goals. Coping  skills, problem solving as well as relaxation therapies were also part of the unit programming.         By the day of discharge Jacob Adams was in much improved condition than upon admission.  Symptoms were reported as significantly decreased or resolved completely.  The patient denied SI/HI and voiced no AVH. He was motivated to continue taking medication with a goal of continued improvement in mental health.          Jacob Adams was discharged home with a plan to follow up as noted below. Patient was provided with a two week supply of medication to take with him to Jacob Adams tomorrow.   Consults:  None  Significant Diagnostic Studies:  labs: Routine admission labs   Discharge Vitals:   Blood pressure 101/67, pulse 92, temperature 98.4 F (36.9 C), temperature source Oral, resp. rate 18, height 5\' 8"  (1.727 m), weight 93.441 kg (206 lb). Body mass index is 31.33 kg/(m^2). Lab Results:   No results found for this or any previous visit (from the past 72 hour(s)).  Physical Findings: AIMS: Facial and Oral Movements Muscles of Facial Expression: None, normal Lips and Perioral Area: None, normal Jaw: None, normal Tongue: None, normal,Extremity Movements Upper (arms, wrists, hands, fingers): None, normal Lower (legs, knees, ankles, toes): None, normal, Trunk Movements Neck, shoulders, hips: None, normal, Overall Severity Severity of abnormal movements (highest score from questions above): None, normal Incapacitation due to abnormal movements: None, normal Patient's awareness of abnormal movements (rate only patient's report): No Awareness, Dental Status Current problems with teeth and/or dentures?: No Does patient usually wear dentures?: No  CIWA:  CIWA-Ar Total: 0 COWS:     Psychiatric Specialty Exam: See Psychiatric Specialty Exam and Suicide Risk Assessment completed by Attending Physician prior to discharge.  Discharge destination:  Home  Is patient on multiple antipsychotic  therapies at discharge:  No   Has Patient had three or more failed trials of antipsychotic monotherapy by history:  No  Recommended Plan for Multiple Antipsychotic Therapies: NA  Discharge Orders   Future Orders Complete By Expires   Discharge instructions  As directed    Comments:     Please follow up with your Primary Care Provider for further management of medical problems.       Medication List    STOP taking these medications       chlorproMAZINE 25 MG tablet  Commonly known as:  THORAZINE     sertraline 25 MG tablet  Commonly known as:  ZOLOFT     VITAMIN D PO      TAKE these medications     Indication   amLODipine 10 MG tablet  Commonly known as:  NORVASC  Take 1 tablet (10 mg total) by mouth daily.   Indication:  High Blood Pressure     benztropine 0.5 MG tablet  Commonly known as:  COGENTIN  Take 1 tablet (0.5 mg total) by mouth at bedtime. To prevent side effects.   Indication:  Extrapyramidal Reaction caused by Medications     famotidine 20 MG  tablet  Commonly known as:  PEPCID  Take 1 tablet (20 mg total) by mouth 2 (two) times daily.   Indication:  Gastroesophageal Reflux Disease     FLUoxetine 40 MG capsule  Commonly known as:  PROZAC  Take 1 capsule (40 mg total) by mouth daily.   Indication:  Depression     gabapentin 100 MG capsule  Commonly known as:  NEURONTIN  Take 2 capsules (200 mg total) by mouth 2 (two) times daily. For mood stability   Indication:  Agitation, Alcohol Withdrawal Syndrome, Headache     haloperidol 5 MG tablet  Commonly known as:  HALDOL  Take 1 tablet (5 mg total) by mouth at bedtime.   Indication:  Psychosis     metoCLOPramide 10 MG tablet  Commonly known as:  REGLAN  Take 1 tablet (10 mg total) by mouth 3 (three) times daily.   Indication:  Gastroesophageal Reflux Disease     traZODone 100 MG tablet  Commonly known as:  DESYREL  Take 1 tablet (100 mg total) by mouth at bedtime as needed and may repeat dose one  time if needed for sleep.   Indication:  Trouble Sleeping           Follow-up Information   Follow up with [facility] On 09/02/2013. (Arrive no later than 8AM for your screening appointment.  They will give you a date for admission.  Make sure you go to admission with no less than 14 day supply of medication-preferably 30 day supply.)    Contact information:   [address]  [phone]      Follow up with [facility]. (Go to the walk-in clinic M-F between 8 and 9AM for your hospital follow up appointment.  this is where you will see a psychiatrist and can get medication in their pharmacy.)    Contact information:   [address]  [place]  [phone]      Follow-up recommendations:   Activity: as tolerated  Diet: healthy  Tests: routine  Other: patient to keep his after care appointment   Comments:    Take all your medications as prescribed by your mental healthcare provider.  Report any adverse effects and or reactions from your medicines to your outpatient provider promptly.  Patient is instructed and cautioned to not engage in alcohol and or illegal drug use while on prescription medicines.  In the event of worsening symptoms, patient is instructed to call the crisis hotline, 911 and or go to the nearest ED for appropriate evaluation and treatment of symptoms.  Follow-up with your primary care provider for your other medical issues, concerns and or health care needs.   Total Discharge Time:  Greater than 30 minutes.  SignedFransisca Kaufmann NP-C 09/01/2013, 9:46 AM

## 2013-09-01 NOTE — [facility] Suicide Risk Assessment (Signed)
Suicide Risk Assessment  Discharge Assessment     Demographic Factors:  Male, Low socioeconomic status, Unemployed and African American  Mental Status Per Nursing Assessment::   On Admission:     Current Mental Status by Physician: patient denies suicidal ideation, intent or plan  Loss Factors: Financial problems/change in socioeconomic status  Historical Factors: Family history of mental illness or substance abuse and Impulsivity  Risk Reduction Factors:   Sense of responsibility to family and Positive social support  Continued Clinical Symptoms:  Alcohol/Substance Abuse/Dependencies  Cognitive Features That Contribute To Risk:  Closed-mindedness Polarized thinking    Suicide Risk:  Minimal: No identifiable suicidal ideation.  Patients presenting with no risk factors but with morbid ruminations; may be classified as minimal risk based on the severity of the depressive symptoms  Discharge Diagnoses:   AXIS I:  Major depressive disorder, recurrent episode, severe, specified as with psychotic behavior              Alcohol use disorder severe AXIS II:  Deferred AXIS III:   Past Medical History  Diagnosis Date  . Acid reflux   . Hypertension   . Pancreatitis   . Headache(784.0)    AXIS IV:  economic problems, housing problems, problems related to social environment and problems with primary support group AXIS V:  61-70 mild symptoms  Plan Of Care/Follow-up recommendations:  Activity:  as tolerated Diet:  healthy Tests:  routine Other:  patient to keep his after care appointment  Is patient on multiple antipsychotic therapies at discharge:  No   Has Patient had three or more failed trials of antipsychotic monotherapy by history:  No  Recommended Plan for Multiple Antipsychotic Therapies: NA  Corena Pilgrim, MD 09/01/2013, 9:44 AM

## 2013-09-01 NOTE — Progress Notes (Signed)
Patient resting quietly with eyes closed. Respirations even and unlabored. No distress noted, Q 15 minute check continues as ordered to maintain safety.

## 2013-09-01 NOTE — Progress Notes (Signed)
Patient ID: Jacob Adams, male   DOB: Jul 15, 1966, 47 y.o.   MRN: [id number] D:  Patient discharged to home today.   All belongings retrieved from room and from locker number 8.  He rates depression and hopelessness at 6 this morning and denies suicidal ideation.  His appetite is good.   A:  Reviewed all discharge instructions, medications, and follow up care.  Patient given a two week supply of medications from the hospital pharmacy and a [place] bus pass.  He was escorted to the search room and then to the front lobby where he was given instructions on how to get the the bus stop.   R:  Verbalized understanding of all instructions.  States he feels ready to leave the hospital setting.  States he will be at [facility] at 8 tomorrow morning and if they do not have a bed for him right away he may go to the [facility] in [place].

## 2013-09-02 NOTE — Discharge Summary (Signed)
Seen and agreed. Corena Pilgrim, MD

## 2013-09-06 ENCOUNTER — Encounter (HOSPITAL_COMMUNITY): Payer: Self-pay | Admitting: Emergency Medicine

## 2013-09-06 ENCOUNTER — Emergency Department (HOSPITAL_COMMUNITY)
Admission: EM | Admit: 2013-09-06 | Discharge: 2013-09-06 | Payer: Non-veteran care | Attending: Emergency Medicine | Admitting: Emergency Medicine

## 2013-09-06 ENCOUNTER — Emergency Department (HOSPITAL_COMMUNITY): Payer: Non-veteran care

## 2013-09-06 DIAGNOSIS — Z9861 Coronary angioplasty status: Secondary | ICD-10-CM | POA: Insufficient documentation

## 2013-09-06 DIAGNOSIS — F3289 Other specified depressive episodes: Secondary | ICD-10-CM | POA: Insufficient documentation

## 2013-09-06 DIAGNOSIS — R1084 Generalized abdominal pain: Secondary | ICD-10-CM | POA: Insufficient documentation

## 2013-09-06 DIAGNOSIS — F329 Major depressive disorder, single episode, unspecified: Secondary | ICD-10-CM | POA: Insufficient documentation

## 2013-09-06 DIAGNOSIS — R079 Chest pain, unspecified: Secondary | ICD-10-CM | POA: Insufficient documentation

## 2013-09-06 DIAGNOSIS — Z880 Allergy status to penicillin: Secondary | ICD-10-CM | POA: Insufficient documentation

## 2013-09-06 DIAGNOSIS — I10 Essential (primary) hypertension: Secondary | ICD-10-CM | POA: Insufficient documentation

## 2013-09-06 DIAGNOSIS — F17200 Nicotine dependence, unspecified, uncomplicated: Secondary | ICD-10-CM | POA: Insufficient documentation

## 2013-09-06 DIAGNOSIS — K219 Gastro-esophageal reflux disease without esophagitis: Secondary | ICD-10-CM | POA: Insufficient documentation

## 2013-09-06 DIAGNOSIS — Z79899 Other long term (current) drug therapy: Secondary | ICD-10-CM | POA: Insufficient documentation

## 2013-09-06 LAB — POCT I-STAT TROPONIN I: Troponin i, poc: 0 ng/mL (ref 0.00–0.08)

## 2013-09-06 LAB — COMPREHENSIVE METABOLIC PANEL
ALT: 27 U/L (ref 0–53)
AST: 28 U/L (ref 0–37)
Albumin: 4.4 g/dL (ref 3.5–5.2)
Alkaline Phosphatase: 66 U/L (ref 39–117)
GFR calc Af Amer: >90 mL/min (ref >90)
Glucose, Bld: 80 mg/dL (ref 70–99)
Potassium: 3.5 mEq/L (ref 3.5–5.1)
Sodium: 140 mEq/L (ref 135–145)
Total Protein: 8.8 g/dL — ABNORMAL HIGH (ref 6.0–8.3)

## 2013-09-06 LAB — CBC WITH DIFFERENTIAL/PLATELET
Basophils Absolute: 0 10*3/uL (ref 0.0–0.1)
Eosinophils Absolute: 0 10*3/uL (ref 0.0–0.7)
Eosinophils Relative: 0 % (ref 0–5)
Hemoglobin: 18.5 g/dL — ABNORMAL HIGH (ref 13.0–17.0)
Lymphs Abs: 1.7 10*3/uL (ref 0.7–4.0)
MCH: 34.1 pg — ABNORMAL HIGH (ref 26.0–34.0)
MCV: 95.6 fL (ref 78.0–100.0)
Neutro Abs: 3.4 10*3/uL (ref 1.7–7.7)
Neutrophils Relative %: 62 % (ref 43–77)
Platelets: 274 10*3/uL (ref 150–400)
RBC: 5.42 MIL/uL (ref 4.22–5.81)
WBC: 5.6 10*3/uL (ref 4.0–10.5)

## 2013-09-06 MED ORDER — ONDANSETRON 4 MG PO TBDP: 4.0000 mg | ORAL_TABLET | Freq: Three times a day (TID) | ORAL | Status: DC | PRN

## 2013-09-06 MED ORDER — ASPIRIN 81 MG PO CHEW
324.0000 mg | CHEWABLE_TABLET | Freq: Once | ORAL | Status: AC
  Administered 2013-09-06: 324 mg via ORAL
  Filled 2013-09-06: qty 4

## 2013-09-06 MED ORDER — MORPHINE SULFATE 4 MG/ML IJ SOLN
4.0000 mg | Freq: Once | INTRAMUSCULAR | Status: AC
  Administered 2013-09-06: 4 mg via INTRAVENOUS
  Filled 2013-09-06: qty 1

## 2013-09-06 MED ORDER — ONDANSETRON HCL 4 MG/2ML IJ SOLN
4.0000 mg | Freq: Once | INTRAMUSCULAR | Status: AC
  Administered 2013-09-06: 4 mg via INTRAVENOUS
  Filled 2013-09-06: qty 2

## 2013-09-06 NOTE — Progress Notes (Signed)
Patient Discharge Instructions:  After Visit Summary (AVS):   Faxed to:  09/06/13 Discharge Summary Note:   Faxed to:  09/06/13 Psychiatric Admission Assessment Note:   Faxed to:  09/06/13 Suicide Risk Assessment - Discharge Assessment:   Faxed to:  09/06/13 Faxed/Sent to the Next Level Care provider:  09/06/13 Faxed to [facility] @ [phone] Faxed to [facility] @ [place], 09/06/2013, 2:47 PM

## 2013-09-06 NOTE — ED Provider Notes (Signed)
CSN: [id number]     Arrival date & time 09/06/13  1652 History   First MD Initiated Contact with Patient 09/06/13 1654     Chief Complaint  Patient presents with  . Chest Pain   (Consider location/radiation/quality/duration/timing/severity/associated sxs/prior Treatment) HPI Comments: Patient brought in to the ED today by [employer] due to chest pain.  Apparently the patient was arrested for breaking into houses.  When the police arrested him the patient then stated that he had chest pain and needed to go to the hospital. Patient reports that the pain has been present since 7 AM this morning and has been constant since that time.  Pain is substernal and does not radiate.  He has not taken anything for pain prior to arrival.  He denies any SOB, numbness, tingling, dizziness, or lightheadedness associated with the pain.  He reports that he has also been having some generalized abdominal pain that has also been present since 7 AM this morning, which also has been constant.  He reports that he has also had nausea, vomiting, and diarrhea which began yesterday.  He reports three episodes of vomiting and several episodes of diarrhea.  He denies blood in his emesis or blood in his stool.  Denies urinary symptoms. Denies recent alcohol use.   He has not taken anything for pain or nausea prior to arrival.  He has had two prior Cardiac Catheterizations.  Last one was performed back in 2003, which showed normal coronary arteries.  No prior cardiac history.  He does have a history of HTN.  No history of Hyperlipidemia or DM.    The history is provided by the patient.    Past Medical History  Diagnosis Date  . Acid reflux   . Hypertension   . Pancreatitis   . Headache(784.0)   . Mental disorder   . Depression    Past Surgical History  Procedure Laterality Date  . Cardiac catheterization      2  . Eye surgery Left    History reviewed. No pertinent family history. History  Substance Use Topics  . Smoking  status: Current Every Day Smoker -- 1.00 packs/day    Types: Cigarettes  . Smokeless tobacco: Not on file  . Alcohol Use: Yes     Comment: daily use    Review of Systems  All other systems reviewed and are negative.    Allergies  Uncoded nonscreenable allergen; Acetaminophen; Penicillins; and [name]  Home Medications   Current Outpatient Rx  Name  Route  Sig  Dispense  Refill  . amLODipine (NORVASC) 10 MG tablet   Oral   Take 1 tablet (10 mg total) by mouth daily.         . benztropine (COGENTIN) 0.5 MG tablet   Oral   Take 1 tablet (0.5 mg total) by mouth at bedtime. To prevent side effects.   30 tablet   0   . famotidine (PEPCID) 20 MG tablet   Oral   Take 1 tablet (20 mg total) by mouth 2 (two) times daily.   30 tablet   0   . FLUoxetine (PROZAC) 40 MG capsule   Oral   Take 1 capsule (40 mg total) by mouth daily.   30 capsule   0   . gabapentin (NEURONTIN) 100 MG capsule   Oral   Take 2 capsules (200 mg total) by mouth 2 (two) times daily. For mood stability   60 capsule   0   . haloperidol (HALDOL)  5 MG tablet   Oral   Take 1 tablet (5 mg total) by mouth at bedtime.   30 tablet   0   . metoCLOPramide (REGLAN) 10 MG tablet   Oral   Take 1 tablet (10 mg total) by mouth 3 (three) times daily.   30 tablet   0   . traZODone (DESYREL) 100 MG tablet   Oral   Take 1 tablet (100 mg total) by mouth at bedtime as needed and may repeat dose one time if needed for sleep.   30 tablet   0    BP 114/71  Temp(Src) 98.2 F (36.8 C) (Oral)  Resp 18  SpO2 96% Physical Exam  Nursing note reviewed. Constitutional: He appears well-developed and well-nourished.  HENT:  Head: Normocephalic and atraumatic.  Mouth/Throat: Oropharynx is clear and moist.  Neck: Normal range of motion. Neck supple.  Cardiovascular: Normal rate, regular rhythm and normal heart sounds.   Pulmonary/Chest: Effort normal and breath sounds normal. No respiratory  distress. He has no wheezes. He has no rales. He exhibits tenderness.  Abdominal: Soft. Bowel sounds are normal. He exhibits no distension and no mass. There is generalized tenderness. There is no rigidity, no rebound and no guarding.  Musculoskeletal:  No LE edema bilaterally  Neurological: He is alert.  Skin: Skin is warm and dry.  Psychiatric: He has a normal mood and affect.    ED Course  Procedures (including critical care time) Labs Review Labs Reviewed  CBC WITH DIFFERENTIAL  COMPREHENSIVE METABOLIC PANEL  LIPASE, BLOOD   Imaging Review Dg Chest 2 View  09/06/2013   CLINICAL DATA:  Chest pain and shortness of breath. History of hypertension.  EXAM: CHEST  2 VIEW  COMPARISON:  08/25/2013  FINDINGS: The heart size and mediastinal contours are within normal limits. Both lungs are clear. The visualized skeletal structures are unremarkable.  IMPRESSION: No active cardiopulmonary disease.   Electronically Signed   By: Conchita Paris M.D.   On: 09/06/2013 18:47    EKG Interpretation   None       MDM  No diagnosis found. Patient presents with a chief complaint of chest pain.  He reported the chest pain to [employer] when he was arrested for breaking into houses.  Patient reports that the chest pain has been constant since 7 AM this morning, which is approximately 10 hours prior to arrival in the ED.  Troponin negative.  EKG unremarkable.  Labs unremarkable.  CXR negative.  Due to the fact that the chest pain has been constant for ten hours with a negative troponin and unremarkable EKG, doubt MI.   Patient also complaining of nausea, vomiting, and diarrhea.  No vomiting in the ED.  Patient tolerating PO liquids.  Patient asking to eat before leaving.  Patient with generalized abdominal pain.  NO rebound or guarding.  Feel that the patient is stable for discharge.  Return precautions given.    Hyman Bible, PA-C 09/06/13 2001

## 2013-09-06 NOTE — ED Notes (Signed)
Pt to ED via EMS with c/o chest pain with nausea and vomiting, onset x5 days that worse today. Hx stent placement x2. Pt recently discharged from [facility]. Pt in police custody with 2 police officers accompany the pt to ED.

## 2013-09-07 NOTE — ED Provider Notes (Signed)
Medical screening examination/treatment/procedure(s) were performed by non-physician practitioner and as supervising physician I was immediately available for consultation/collaboration.  EKG Interpretation   None         Maudry Diego, MD 09/07/13 1451

## 2014-07-26 ENCOUNTER — Observation Stay (HOSPITAL_COMMUNITY)
Admission: EM | Admit: 2014-07-26 | Discharge: 2014-07-28 | Disposition: A | Discharge status: Home / Self Care | Payer: Non-veteran care | Attending: Family Medicine | Admitting: Family Medicine

## 2014-07-26 ENCOUNTER — Encounter (HOSPITAL_COMMUNITY): Payer: Self-pay | Admitting: Emergency Medicine

## 2014-07-26 DIAGNOSIS — K219 Gastro-esophageal reflux disease without esophagitis: Secondary | ICD-10-CM | POA: Insufficient documentation

## 2014-07-26 DIAGNOSIS — G43909 Migraine, unspecified, not intractable, without status migrainosus: Secondary | ICD-10-CM | POA: Diagnosis not present

## 2014-07-26 DIAGNOSIS — K625 Hemorrhage of anus and rectum: Secondary | ICD-10-CM

## 2014-07-26 DIAGNOSIS — G4730 Sleep apnea, unspecified: Secondary | ICD-10-CM | POA: Diagnosis not present

## 2014-07-26 DIAGNOSIS — F32A Depression, unspecified: Secondary | ICD-10-CM

## 2014-07-26 DIAGNOSIS — E785 Hyperlipidemia, unspecified: Secondary | ICD-10-CM | POA: Diagnosis not present

## 2014-07-26 DIAGNOSIS — R440 Auditory hallucinations: Secondary | ICD-10-CM | POA: Insufficient documentation

## 2014-07-26 DIAGNOSIS — F333 Major depressive disorder, recurrent, severe with psychotic symptoms: Secondary | ICD-10-CM

## 2014-07-26 DIAGNOSIS — K921 Melena: Principal | ICD-10-CM | POA: Insufficient documentation

## 2014-07-26 DIAGNOSIS — R0789 Other chest pain: Secondary | ICD-10-CM | POA: Diagnosis not present

## 2014-07-26 DIAGNOSIS — F419 Anxiety disorder, unspecified: Secondary | ICD-10-CM | POA: Diagnosis not present

## 2014-07-26 DIAGNOSIS — F329 Major depressive disorder, single episode, unspecified: Secondary | ICD-10-CM

## 2014-07-26 DIAGNOSIS — F1010 Alcohol abuse, uncomplicated: Secondary | ICD-10-CM | POA: Insufficient documentation

## 2014-07-26 DIAGNOSIS — K920 Hematemesis: Secondary | ICD-10-CM | POA: Diagnosis not present

## 2014-07-26 DIAGNOSIS — F10288 Alcohol dependence with other alcohol-induced disorder: Secondary | ICD-10-CM

## 2014-07-26 DIAGNOSIS — F1020 Alcohol dependence, uncomplicated: Secondary | ICD-10-CM | POA: Diagnosis present

## 2014-07-26 DIAGNOSIS — I10 Essential (primary) hypertension: Secondary | ICD-10-CM | POA: Insufficient documentation

## 2014-07-26 DIAGNOSIS — F17210 Nicotine dependence, cigarettes, uncomplicated: Secondary | ICD-10-CM | POA: Insufficient documentation

## 2014-07-26 DIAGNOSIS — K648 Other hemorrhoids: Secondary | ICD-10-CM | POA: Diagnosis not present

## 2014-07-26 DIAGNOSIS — F319 Bipolar disorder, unspecified: Secondary | ICD-10-CM | POA: Insufficient documentation

## 2014-07-26 HISTORY — DX: Bipolar disorder, unspecified: F31.9

## 2014-07-26 HISTORY — DX: Personal history of peptic ulcer disease: Z87.11

## 2014-07-26 HISTORY — DX: Unspecified osteoarthritis, unspecified site: M19.90

## 2014-07-26 HISTORY — DX: Migraine, unspecified, not intractable, without status migrainosus: G43.909

## 2014-07-26 HISTORY — DX: Sleep apnea, unspecified: G47.30

## 2014-07-26 HISTORY — DX: Gastrointestinal hemorrhage, unspecified: K92.2

## 2014-07-26 HISTORY — DX: Personal history of other medical treatment: Z92.89

## 2014-07-26 HISTORY — DX: Personal history of other diseases of the digestive system: Z87.19

## 2014-07-26 HISTORY — DX: Hemorrhage of anus and rectum: K62.5

## 2014-07-26 HISTORY — DX: Anxiety disorder, unspecified: F41.9

## 2014-07-26 LAB — CBC WITH DIFFERENTIAL/PLATELET
Basophils Absolute: 0 10*3/uL (ref 0.0–0.1)
Basophils Relative: 1 % (ref 0–1)
Eosinophils Absolute: 0.1 10*3/uL (ref 0.0–0.7)
Eosinophils Relative: 1 % (ref 0–5)
HCT: 42 % (ref 39.0–52.0)
Hemoglobin: 15 g/dL (ref 13.0–17.0)
Lymphocytes Relative: 40 % (ref 12–46)
Lymphs Abs: 1.9 10*3/uL (ref 0.7–4.0)
MCH: 32.9 pg (ref 26.0–34.0)
MCHC: 35.7 g/dL (ref 30.0–36.0)
MCV: 92.1 fL (ref 78.0–100.0)
Monocytes Absolute: 0.4 10*3/uL (ref 0.1–1.0)
Monocytes Relative: 8 % (ref 3–12)
Neutro Abs: 2.4 10*3/uL (ref 1.7–7.7)
Neutrophils Relative %: 50 % (ref 43–77)
Platelets: 209 10*3/uL (ref 150–400)
RBC: 4.56 MIL/uL (ref 4.22–5.81)
RDW: 13 % (ref 11.5–15.5)
WBC: 4.8 10*3/uL (ref 4.0–10.5)

## 2014-07-26 LAB — COMPREHENSIVE METABOLIC PANEL
ALT: 14 U/L (ref 0–53)
AST: 24 U/L (ref 0–37)
Albumin: 3.3 g/dL — ABNORMAL LOW (ref 3.5–5.2)
Alkaline Phosphatase: 51 U/L (ref 39–117)
Anion gap: 16 — ABNORMAL HIGH (ref 5–15)
BUN: 11 mg/dL (ref 6–23)
CO2: 19 mEq/L (ref 19–32)
Calcium: 8.5 mg/dL (ref 8.4–10.5)
Chloride: 101 mEq/L (ref 96–112)
Creatinine, Ser: 0.73 mg/dL (ref 0.50–1.35)
GFR calc Af Amer: >90 mL/min (ref >90)
GFR calc non Af Amer: >90 mL/min (ref >90)
Glucose, Bld: 93 mg/dL (ref 70–99)
Potassium: 4 mEq/L (ref 3.7–5.3)
Sodium: 136 mEq/L — ABNORMAL LOW (ref 137–147)
Total Bilirubin: 0.3 mg/dL (ref 0.3–1.2)
Total Protein: 6.6 g/dL (ref 6.0–8.3)

## 2014-07-26 LAB — I-STAT TROPONIN, ED: Troponin i, poc: 0 ng/mL (ref 0.00–0.08)

## 2014-07-26 LAB — RAPID URINE DRUG SCREEN, HOSP PERFORMED
Amphetamines: NOT DETECTED
Barbiturates: NOT DETECTED
Benzodiazepines: NOT DETECTED
Cocaine: NOT DETECTED
Opiates: NOT DETECTED
Tetrahydrocannabinol: NOT DETECTED

## 2014-07-26 LAB — POC OCCULT BLOOD, ED: Fecal Occult Bld: POSITIVE — AB

## 2014-07-26 LAB — ETHANOL: Alcohol, Ethyl (B): 89 mg/dL — ABNORMAL HIGH (ref 0–11)

## 2014-07-26 LAB — LIPASE, BLOOD: Lipase: 44 U/L (ref 11–59)

## 2014-07-26 MED ORDER — SODIUM CHLORIDE 0.9 % IJ SOLN
3.0000 mL | Freq: Two times a day (BID) | INTRAMUSCULAR | Status: DC
  Administered 2014-07-27 (×3): 3 mL via INTRAVENOUS

## 2014-07-26 MED ORDER — GABAPENTIN 100 MG PO CAPS
200.0000 mg | ORAL_CAPSULE | Freq: Two times a day (BID) | ORAL | Status: DC
  Administered 2014-07-27 – 2014-07-28 (×4): 200 mg via ORAL
  Filled 2014-07-26 (×5): qty 2

## 2014-07-26 MED ORDER — MORPHINE SULFATE 2 MG/ML IJ SOLN
1.0000 mg | INTRAMUSCULAR | Status: DC | PRN
  Administered 2014-07-27 (×4): 1 mg via INTRAVENOUS
  Filled 2014-07-26 (×4): qty 1

## 2014-07-26 MED ORDER — THIAMINE HCL 100 MG/ML IJ SOLN
Freq: Once | INTRAVENOUS | Status: AC
  Administered 2014-07-27: 01:00:00 via INTRAVENOUS
  Filled 2014-07-26: qty 1000

## 2014-07-26 MED ORDER — PANTOPRAZOLE SODIUM 40 MG IV SOLR
40.0000 mg | INTRAVENOUS | Status: DC
  Administered 2014-07-27: 40 mg via INTRAVENOUS
  Filled 2014-07-26: qty 40

## 2014-07-26 MED ORDER — FAMOTIDINE IN NACL 20-0.9 MG/50ML-% IV SOLN
20.0000 mg | Freq: Once | INTRAVENOUS | Status: AC
  Administered 2014-07-26: 20 mg via INTRAVENOUS
  Filled 2014-07-26: qty 50

## 2014-07-26 MED ORDER — HYDROMORPHONE HCL 1 MG/ML IJ SOLN
1.0000 mg | Freq: Once | INTRAMUSCULAR | Status: AC
  Administered 2014-07-26: 1 mg via INTRAVENOUS
  Filled 2014-07-26: qty 1

## 2014-07-26 MED ORDER — ADULT MULTIVITAMIN W/MINERALS CH
1.0000 | ORAL_TABLET | Freq: Every day | ORAL | Status: DC
  Administered 2014-07-27 – 2014-07-28 (×2): 1 via ORAL
  Filled 2014-07-26 (×2): qty 1

## 2014-07-26 MED ORDER — LORAZEPAM 1 MG PO TABS: 1.0000 mg | ORAL_TABLET | Freq: Four times a day (QID) | ORAL | Status: DC | PRN

## 2014-07-26 MED ORDER — MORPHINE SULFATE 4 MG/ML IJ SOLN
4.0000 mg | Freq: Once | INTRAMUSCULAR | Status: AC
  Administered 2014-07-26: 4 mg via INTRAVENOUS
  Filled 2014-07-26: qty 1

## 2014-07-26 MED ORDER — LORAZEPAM 2 MG/ML IJ SOLN: 1.0000 mg | Freq: Four times a day (QID) | INTRAMUSCULAR | Status: DC | PRN

## 2014-07-26 MED ORDER — VITAMIN B-1 100 MG PO TABS
100.0000 mg | ORAL_TABLET | Freq: Every day | ORAL | Status: DC
  Administered 2014-07-27 – 2014-07-28 (×2): 100 mg via ORAL
  Filled 2014-07-26 (×2): qty 1

## 2014-07-26 MED ORDER — THIAMINE HCL 100 MG/ML IJ SOLN
100.0000 mg | Freq: Every day | INTRAMUSCULAR | Status: DC
  Filled 2014-07-26 (×2): qty 1

## 2014-07-26 MED ORDER — TRAZODONE HCL 50 MG PO TABS
50.0000 mg | ORAL_TABLET | Freq: Every evening | ORAL | Status: DC | PRN
  Filled 2014-07-26: qty 1

## 2014-07-26 MED ORDER — INFLUENZA VAC SPLIT QUAD 0.5 ML IM SUSY
0.5000 mL | PREFILLED_SYRINGE | INTRAMUSCULAR | Status: AC
  Administered 2014-07-27: 0.5 mL via INTRAMUSCULAR
  Filled 2014-07-26: qty 0.5

## 2014-07-26 MED ORDER — FOLIC ACID 1 MG PO TABS
1.0000 mg | ORAL_TABLET | Freq: Every day | ORAL | Status: DC
  Administered 2014-07-27 – 2014-07-28 (×2): 1 mg via ORAL
  Filled 2014-07-26 (×2): qty 1

## 2014-07-26 MED ORDER — PANTOPRAZOLE SODIUM 40 MG IV SOLR
40.0000 mg | Freq: Once | INTRAVENOUS | Status: AC
  Administered 2014-07-26: 40 mg via INTRAVENOUS
  Filled 2014-07-26: qty 40

## 2014-07-26 NOTE — Progress Notes (Addendum)
Jacob Adams [id number] Admission Data: 07/26/2014 6:41 PM Attending Provider: Willeen Niece, MD  ZPH:[id number] NOT IN SYSTEM Consults/ Treatment Team:    Jacob Adams is a 48 y.o. male patient admitted from ED awake, alert  & orientated  X 3,  Prior, VSS - Blood pressure 132/70, pulse 88, temperature 98 F (36.7 C), temperature source Oral, resp. rate 13, SpO2 95.00%.,, no c/o shortness of breath,no distress noted.    IV site WDL: Left wrist NSL.   Allergies:   Allergies  Allergen Reactions  . Uncoded Nonscreenable Allergen Other (See Comments)    Sun tan lotion: swelling and peeling  . Acetaminophen     Make he side hurt; he stated he has liver damage.   Marland Kitchen Penicillins Itching, Swelling and Rash  . [name] Other (See Comments)    DOESN'T EAT PORK-patient preference     Past Medical History  Diagnosis Date  . Acid reflux   . Hypertension   . Pancreatitis   . Headache(784.0)   . Mental disorder   . Depression      Pt orientation to unit, room and routine. Information packet given to patient/family and safety video watched.  Admission INP armband ID verified with patient/family, and in place. SR up x 2, fall risk assessment complete with Patient and family verbalizing understanding of risks associated with falls. Pt verbalizes an understanding of how to use the call bell and to call for help before getting out of bed.  Skin, clean-dry- intact without evidence of bruising, or skin tears.   No evidence of skin break down noted on exam. Pt. Denies suicidal ideations/No thoughts of harming himself or others.  Calm and cooperative at this time.       Will cont to monitor and assist as needed.  Dayle Points, RN 07/26/2014 6:41 PM

## 2014-07-26 NOTE — ED Notes (Signed)
Pt wife called to give information that she felts was important, "trazodone, pam, etc weren't filled yet because the [place] has not mailed them. He also forgets a lot and is having mental issues from not taking his medications. He has been burning himself with cigarettes on his left hand due to his mental issues. Pt had an appointment with behavioral person at [facility] and they were going to keep him from SI/ social worker Ileana Roup from substance abuse and mental health in [place]. PCP dr Tamala Julian. He has bpd and schizophrenia and depression." [phone] - Edwina wife

## 2014-07-26 NOTE — ED Notes (Signed)
Pt reports to the ED for eval of multiple symptoms. Pt arrives via [facility] from home. He is complaining of CP and epigastric pain intermittent for 1 month. Describes it as stabbing pain. Pt denies any SOB but does reports some associated N/V, lightheadedness, and dizziness. Also reports increased lethargy and fatigue. Pt also reports 2 episodes of hematemesis in the past 24 hours as well as diarrhea with both dark and bright red blood. He is unsure of how many episodes of diarrhea he has had. 12 lead shows NSR. Pt has hx of pancreatitis and reports the pain feels similar. Pt also reports SI. Denies a plan. Reports he was supposed to see his psychiatrist this am but they cancelled. Pt reports he has been drinking last ETOH intake was today. Pt A&Ox4, resp e/u, and skin warm and dry.

## 2014-07-26 NOTE — ED Provider Notes (Signed)
CSN: [id number]     Arrival date & time 07/26/14  1354 History   First MD Initiated Contact with Patient 07/26/14 1355     Chief Complaint  Patient presents with  . Chest Pain  . Abdominal Pain     (Consider location/radiation/quality/duration/timing/severity/associated sxs/prior Treatment) HPI Pt is a 48yo male with hx of acid refulx, HTN, pancreatitis, headaches, depression, mental disorder, and etoh abuse.  Pt presenting to ED via EMS from home with c/o intermittent chest pain and epigastric pain x1 month. Describes chest pain and abdominal pain as sharp and stabbing in nature, 9/10 at worst.  Associated with nausea, hematemesis, and hematochezia with bright red blood at times.  Two episodes of hematemesis in last 24 hours.  Pt does report hx of pancreatitis, states this pain feels similar. Pt also reports drinking etoh, 40oz today. States he does not normally drink that much. Denies use of cocaine, heroine, or any other drugs.  Pt does report SI per triage note.  States he was suppose to see his psychiatrist but they cancelled.    Past Medical History  Diagnosis Date  . Acid reflux   . Hypertension   . Pancreatitis   . Headache(784.0)   . Mental disorder   . Depression    Past Surgical History  Procedure Laterality Date  . Cardiac catheterization      2  . Eye surgery Left    History reviewed. No pertinent family history. History  Substance Use Topics  . Smoking status: Current Every Day Smoker -- 1.00 packs/day    Types: Cigarettes  . Smokeless tobacco: Not on file  . Alcohol Use: Yes     Comment: daily use    Review of Systems  Constitutional: Positive for fatigue. Negative for fever and chills.  Respiratory: Negative for cough and shortness of breath.   Cardiovascular: Positive for chest pain. Negative for palpitations and leg swelling.  Gastrointestinal: Positive for nausea, vomiting, abdominal pain, diarrhea and blood in stool. Negative for constipation and rectal  pain.  Genitourinary: Negative for hematuria and flank pain.  Musculoskeletal: Negative for back pain and myalgias.  Neurological: Positive for weakness ( generalized).  All other systems reviewed and are negative.     Allergies  Uncoded nonscreenable allergen; Acetaminophen; Penicillins; and [name]  Home Medications   Prior to Admission medications   Medication Sig Start Date End Date Taking? Authorizing Provider  amLODipine (NORVASC) 10 MG tablet Take 5 mg by mouth at bedtime.   Yes Historical Provider, MD  cholecalciferol (VITAMIN D) 1000 UNITS tablet Take 1,000 Units by mouth at bedtime.   Yes Historical Provider, MD  FLUoxetine (PROZAC) 40 MG capsule Take 1 capsule (40 mg total) by mouth daily. 09/01/13  Yes Elmarie Shiley, NP  gabapentin (NEURONTIN) 100 MG capsule Take 2 capsules (200 mg total) by mouth 2 (two) times daily. For mood stability 09/01/13  Yes Elmarie Shiley, NP  haloperidol (HALDOL) 5 MG tablet Take 1 tablet (5 mg total) by mouth at bedtime. 09/01/13  Yes Elmarie Shiley, NP  omeprazole (PRILOSEC) 20 MG capsule Take 20 mg by mouth at bedtime.   Yes Historical Provider, MD  sertraline (ZOLOFT) 100 MG tablet Take 200 mg by mouth at bedtime.   Yes Historical Provider, MD  traZODone (DESYREL) 100 MG tablet Take 1 tablet (100 mg total) by mouth at bedtime as needed and may repeat dose one time if needed for sleep. 09/01/13  Yes Elmarie Shiley, NP   BP 131/74  Pulse  94  Temp(Src) 98 F (36.7 C) (Oral)  Resp 14  SpO2 97% Physical Exam  Nursing note and vitals reviewed. Constitutional: He appears well-developed and well-nourished. He appears lethargic. No distress.  Pt appears lethargic, flat affect. Room smells of etoh  HENT:  Head: Normocephalic and atraumatic.  Eyes: Conjunctivae are normal. No scleral icterus.  Neck: Normal range of motion. Neck supple.  Cardiovascular: Normal rate, regular rhythm and normal heart sounds.   Regular rate and rhythm   Pulmonary/Chest: Effort normal and breath sounds normal. No respiratory distress. He has no wheezes. He has no rales. He exhibits no tenderness.  Abdominal: Soft. Bowel sounds are normal. He exhibits no distension and no mass. There is tenderness. There is no rebound and no guarding.  Obese abdomen, soft, tenderness in epigastrium and mid-abdomen. No rebound, guarding or masses. No CVAT  Genitourinary: Guaiac positive stool.  Rectal exam: chaperoned. Good rectal tone. No gross bright red blood. Scant brown stool on glove.   Musculoskeletal: Normal range of motion.  Neurological: He appears lethargic.  Lethargic but able to follow 2 step commands slowly.  Skin: Skin is warm and dry.  Psychiatric: His affect is blunt. He is slowed. He is not withdrawn. He exhibits a depressed mood. He expresses suicidal ideation. He expresses no homicidal ideation. He expresses no suicidal plans and no homicidal plans.    ED Course  Procedures (including critical care time) Labs Review Labs Reviewed  COMPREHENSIVE METABOLIC PANEL - Abnormal; Notable for the following:    Sodium 136 (*)    Albumin 3.3 (*)    Anion gap 16 (*)    All other components within normal limits  ETHANOL - Abnormal; Notable for the following:    Alcohol, Ethyl (B) 89 (*)    All other components within normal limits  POC OCCULT BLOOD, ED - Abnormal; Notable for the following:    Fecal Occult Bld POSITIVE (*)    All other components within normal limits  CBC WITH DIFFERENTIAL  LIPASE, BLOOD  URINE RAPID DRUG SCREEN (HOSP PERFORMED)  I-STAT TROPOININ, ED    Imaging Review No results found.   EKG Interpretation   Date/Time:  Wednesday July 26 2014 14:05:02 EDT Ventricular Rate:  98 PR Interval:  192 QRS Duration: 92 QT Interval:  364 QTC Calculation: 464 R Axis:   -46 Text Interpretation:  Normal sinus rhythm Left anterior fascicular block  Abnormal ECG No significant change since last tracing Confirmed by   Christy Gentles  MD, Elenore Rota ([id number]) on 07/26/2014 2:10:00 PM      MDM   Final diagnoses:  Hematochezia  Hematemesis with nausea  Depression   Pt is a 48yo male with hx of depression, mental disorder, and acid reflux c/o chest pain and epigastric pain associated with 2 episodes of hematemesis and hematochezia with bright red blood and "blood clots"  Pt appears depressed with flat affect and lethargic.  Oriented to person place and time. Abd: soft, non-distended. Tenderness in epigastrium and mid-abdomen. Hemoccult: positive.  Labs otherwise, unremarkable.  Vitals: WNL including normal HR and BP  Discussed pt with Dr. Christy Gentles, although hemodynamically stable, with hx of etoh, epigastric pain, hematemesis and hematochezia, concern for GI bleed. Will consult to admit pt.  Pt has not been admitted prior. No PCP, states he goes to the [place].  4:46 PM  Pt states he is not having SI at this time. Pt is c/o continued abdominal pain and burning. Pt has been given IV morphine, pepcid and protonix.  Discussed GI cocktail with Dr. Christy Gentles, will keep pt NPO due to concern for GI bleed. IV dilaudid given as pt no longer appears lethargic, just depressed.  Etoh-insignificant at 86, UDS: negative.   5:14 PM Consulted with Family Medicine. Pt will be admitted to a tele bed, attending Dr. Lindell Noe.     Noland Fordyce, PA-C 07/26/14 1718

## 2014-07-26 NOTE — H&P (Signed)
Family Medicine Teaching [facility] Admission History and Physical Service Pager: [phone]  Patient name: Jacob Adams Medical record number: [id number] Date of birth: 09/17/1966 Age: 48 y.o. Gender: male  Primary Care Provider: PROVIDER NOT IN SYSTEM [facility] (Dr Katrinka Blazing) Consultants: GI - will call in AM Code Status: FULL, discuss in AM  Chief Complaint: Abdominal pain, rectal bleed, chest "funny feeling."  Assessment and Plan: Jacob Adams is a 48 y.o. male presenting with abdominal pain, rectal bleeding, and chest discomfort. PMH is significant for HTN, acid reflux, pancreatitis, headches, depression, mental disorder, and ETOH abuse.   Epigastric abdominal pain and bleed Present for 1 month and in patient's past. No EGD seen in chart or GI notes. Description is of clot of bright red blood on stool, presumably a small amount intermittently. FOBT positive. Hgb normal. EtOH abuse chronically makes gastritis, bleeding ulcer, or variceal bleed possible. ?small hemorrhoid on exam, not actively bleeding on exam. Currently hemodynamically stable, afebrile with normal WBC count. Infectious cause of abdominal pain unlikely. Lipase negative. CMET normal. Report of blood clot makes hemorrhoid most likely. Colonoscopy ~2 years ago but patient cannot recall result. - Admit to [facility] Service, attending Dr Mauricio Po, to telemetry - Insert 1 large bore IV - S/p protonix 40mg  IV. Redose daily. - If further episode of GI bleed, call GI tonight. Otherwise, consulted in ED and will await note in AM. - Did not ask about NSAID use - Check another hemoglobin now and in AM along with AM INR. - Clear liquid diet now, NPO in AM pending eval by GI and possible EGD.  - IV fluids - dry appearing (in banana bag) - morphine 1q2 prn - hold norvasc while GI bleed and monitor BP  Chest discomfort Some typical features present such as substernal and worsened with activity or stress;  however, chest wall tender and with epigastric tenderness this could also be related to GI process.  Stable EKG. UDS negative, POC trop neg; lipase and CMET normal. - Telemetry - continue PPI - Serial enzymes - AM EKG - Morphine 1mg  q2 hours PRN - Risk stratification labs - Aspirin once GI bleeding resolves. - GI cocktail in AM if no EGD.  Alcoholism Albumin 3.3, likely chronically mildly malnourished. UDS neg. Alc level mildly elevated. - CIWA - banana bag - SW consulted to discuss alcohol abuse.  Depression Endorsed SI in triage but passive, no longer endorsing any SI and states he just occasionally wants to feel "quiet." Denies HI. - Request sitter if patient becomes actively suicidal with plan or intent. - Previously on an antidepressant though he has not been taking for some time.  - F/u outpatient with psychiatrist to decide on what to restart that she has been off for some time (zoloft, prozac, haldol) - Continue Gabapentin 200mg  BID for mood stability and trazodone at lower dose.   FEN/GI: IV bananabag at 100cc/hr ; clears overnight, NPO in AM pending GI eval Prophylaxis: No VTE ppx due to GI bleed; SCDs  Disposition: Pending workup for Gi bleed  History of Present Illness: Jacob Adams is a 48 y.o. male presenting with epigastric abdominal pain, GI bleeding, and chest discomfort. He reports that over the past few years he has had a couple episodes of epigastric abdominal pain and recently has been having bowel movements that are "red with some clots in them" in a small area over the stool. He was also vomiting last night but he did not endorse  hematochezia. He often does not eat because of nausea. He has never had gallstones and still has his gallbladder. He occasinally get chills and sweating that is chronic. He has had pain in lower epigastrum that is worse with palpation. He has had normal voiding and stool otherwise. He has had "acidy" taste in mouth. H/o pancreatitis, did  not feel like this. Takes omeprazole daily, avoids caffenine, and avoids sodas. However, drinks 1+ 40 of beer a day. Lots of life stressors.  Chest discomfort He endorses a "funny" feeling in chest and dyspnea, lightheadedness without syncope, and feeling like his heart is beating strangele. This began 1 week ago. It has occurred in the past. He thinks he has had some catheterizations at [facility], though I cannot find details. He is unable to further describe chest discomfort, though it is better with rest and worse with exertion. Burping used to help.  Denies new PO medications.  In the ED, he received pepcid 20mg  IV, dilaudid 1 mg x 2, morphine 4mg  IV, and protonix 40mg  IV.  Review Of Systems: Per HPI with the following additions: None Otherwise 12 point review of systems was performed and was unremarkable.  Patient Active Problem List   Diagnosis Date Noted  . GI bleed 07/26/2014  . Major depressive disorder, recurrent episode, severe, specified as with psychotic behavior 08/29/2013  . Alcohol dependence 08/29/2013   Past Medical History: Past Medical History  Diagnosis Date  . Acid reflux   . Hypertension   . Pancreatitis   . Headache(784.0)   . Mental disorder   . Depression    Past Surgical History: Past Surgical History  Procedure Laterality Date  . Cardiac catheterization      2  . Eye surgery Left    Social History: History  Substance Use Topics  . Smoking status: Current Every Day Smoker -- 1.00 packs/day    Types: Cigarettes  . Smokeless tobacco: Not on file  . Alcohol Use: Yes     Comment: daily use   Additional social history: Denies drug use. Please also refer to relevant sections of EMR.  Family History: History reviewed. No pertinent family history. Allergies and Medications: Allergies  Allergen Reactions  . Uncoded Nonscreenable Allergen Other (See Comments)    Sun tan lotion: swelling and peeling  . Acetaminophen     Make he side hurt; he stated he  has liver damage.   Marland Kitchen Penicillins Itching, Swelling and Rash  . [name] Other (See Comments)    DOESN'T EAT PORK-patient preference   No current facility-administered medications on file prior to encounter.   Current Outpatient Prescriptions on File Prior to Encounter  Medication Sig Dispense Refill  . FLUoxetine (PROZAC) 40 MG capsule Take 1 capsule (40 mg total) by mouth daily.  30 capsule  0  . gabapentin (NEURONTIN) 100 MG capsule Take 2 capsules (200 mg total) by mouth 2 (two) times daily. For mood stability  60 capsule  0  . haloperidol (HALDOL) 5 MG tablet Take 1 tablet (5 mg total) by mouth at bedtime.  30 tablet  0  . traZODone (DESYREL) 100 MG tablet Take 1 tablet (100 mg total) by mouth at bedtime as needed and may repeat dose one time if needed for sleep.  30 tablet  0    Objective: BP 133/88  Pulse 81  Temp(Src) 98.4 F (36.9 C) (Oral)  Resp 16  Ht 5\' 10"  (1.778 m)  Wt 233 lb 6.4 oz (105.87 kg)  BMI 33.49 kg/m2  SpO2 97% Exam: General: NAD, lying in bed HEENT: AT/[place], sclera clear, EOMI, PERRL, o/p mildly dry Cardiovascular: RRR, no m/r/g, 2+ B DP pulses Respiratory: CTAB, normal effort Abdomen: soft, mild-moderately tender epigastrically and upper quadrants, nondistended, NABS Extremities: No LE edema or calf tenderness Skin: No rash or cyanosis Neuro: Awake, alert, no focal deficits, normal speech CHEST: Reproduced symptoms with palpation RECTAL: Good tone, small skin tag at 6 o'clock position that is not bleeding or thrombosed; no internals noted in internal rectal exam   Labs and Imaging: CBC BMET   Recent Labs Lab 07/26/14 1421  WBC 4.8  HGB 15.0  HCT 42.0  PLT 209    Recent Labs Lab 07/26/14 1421  NA 136*  K 4.0  CL 101  CO2 19  BUN 11  CREATININE 0.73  GLUCOSE 93  CALCIUM 8.5      Leona Singleton, MD 07/26/2014, 9:06 PM PGY-3, [place] [facility] FPTS Intern pager: [phone], text pages welcome

## 2014-07-26 NOTE — ED Notes (Signed)
EKG > 10 minutes because pt in hallway and machine had to be taken from triage.

## 2014-07-27 DIAGNOSIS — K921 Melena: Secondary | ICD-10-CM

## 2014-07-27 DIAGNOSIS — I10 Essential (primary) hypertension: Secondary | ICD-10-CM

## 2014-07-27 LAB — TROPONIN I: Troponin I: <0.3 ng/mL (ref <0.30)

## 2014-07-27 LAB — LIPID PANEL
CHOL/HDL RATIO: 2.8 ratio
Cholesterol: 222 mg/dL — ABNORMAL HIGH (ref 0–200)
HDL: 79 mg/dL (ref >39)
LDL Cholesterol: 122 mg/dL — ABNORMAL HIGH (ref 0–99)
Triglycerides: 105 mg/dL (ref <150)
VLDL: 21 mg/dL (ref 0–40)

## 2014-07-27 LAB — CBC
HEMATOCRIT: 42.4 % (ref 39.0–52.0)
Hemoglobin: 14.4 g/dL (ref 13.0–17.0)
MCH: 31.6 pg (ref 26.0–34.0)
MCHC: 34 g/dL (ref 30.0–36.0)
MCV: 93.2 fL (ref 78.0–100.0)
Platelets: 209 10*3/uL (ref 150–400)
RBC: 4.55 MIL/uL (ref 4.22–5.81)
RDW: 13.3 % (ref 11.5–15.5)
WBC: 4.9 10*3/uL (ref 4.0–10.5)

## 2014-07-27 LAB — PROTIME-INR
INR: 1.08 (ref 0.00–1.49)
Prothrombin Time: 14 seconds (ref 11.6–15.2)

## 2014-07-27 LAB — BASIC METABOLIC PANEL
Anion gap: 13 (ref 5–15)
BUN: 11 mg/dL (ref 6–23)
CO2: 22 meq/L (ref 19–32)
CREATININE: 0.9 mg/dL (ref 0.50–1.35)
Calcium: 8.2 mg/dL — ABNORMAL LOW (ref 8.4–10.5)
Chloride: 101 mEq/L (ref 96–112)
GFR calc Af Amer: >90 mL/min (ref >90)
Glucose, Bld: 100 mg/dL — ABNORMAL HIGH (ref 70–99)
Potassium: 3.7 mEq/L (ref 3.7–5.3)
Sodium: 136 mEq/L — ABNORMAL LOW (ref 137–147)

## 2014-07-27 LAB — HEMOGLOBIN A1C
Hgb A1c MFr Bld: 5.7 % — ABNORMAL HIGH (ref <5.7)
MEAN PLASMA GLUCOSE: 117 mg/dL — AB (ref <117)

## 2014-07-27 MED ORDER — PANTOPRAZOLE SODIUM 40 MG PO TBEC: 40.0000 mg | DELAYED_RELEASE_TABLET | Freq: Every day | ORAL | Status: DC

## 2014-07-27 MED ORDER — BOOST / RESOURCE BREEZE PO LIQD
1.0000 | Freq: Two times a day (BID) | ORAL | Status: DC
  Administered 2014-07-27: 1 via ORAL

## 2014-07-27 MED ORDER — MORPHINE SULFATE 2 MG/ML IJ SOLN
1.0000 mg | INTRAMUSCULAR | Status: DC | PRN
  Administered 2014-07-27 – 2014-07-28 (×4): 1 mg via INTRAVENOUS
  Filled 2014-07-27 (×4): qty 1

## 2014-07-27 MED ORDER — HYDROCORTISONE ACETATE 25 MG RE SUPP
25.0000 mg | Freq: Two times a day (BID) | RECTAL | Status: DC
  Administered 2014-07-27 – 2014-07-28 (×2): 25 mg via RECTAL
  Filled 2014-07-27 (×3): qty 1

## 2014-07-27 MED ORDER — PANTOPRAZOLE SODIUM 40 MG PO TBEC
40.0000 mg | DELAYED_RELEASE_TABLET | Freq: Every day | ORAL | Status: DC
  Administered 2014-07-28: 40 mg via ORAL
  Filled 2014-07-27: qty 1

## 2014-07-27 NOTE — Progress Notes (Signed)
UR completed 
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
## 2014-07-27 NOTE — Clinical Social Work Note (Signed)
Clinical Social Worker provided patient with substance abuse and shelter resources to address pt concerns. Pt did not express further questions/concerns at this time. No further intervention needed. CSW signing off.   Glendon Axe, MSW, LCSWA [phone] 07/27/2014 3:50 PM

## 2014-07-27 NOTE — Discharge Summary (Signed)
Family Medicine Teaching [facility] Discharge Summary  Patient name: Jacob Adams Medical record number: [id number] Date of birth: [birth year] Age: 48 y.o. Gender: male Date of Admission: 07/26/2014  Date of Discharge: 07/28/2014 Admitting Physician: Barbaraann Barthel, MD  Primary Care Provider: PROVIDER NOT IN SYSTEM Consultants: GI  Indication for Hospitalization: epigastric pain, recent history of blood in stool, chest pain rule out  Discharge Diagnoses/Problem List:  - Internal hemorrhoid - Alcohol abuse - Depression with psychotic features - HLD  Disposition: Discharged home, with the plan to try and be admitted to an inpatient alcohol rehab facility  Discharge Condition: Stable  Discharge Exam:  General: Laying down in bed, in NAD CV: RRR, no m/r/g Pulm: CTAB, normal work of breathing Abd: soft, mildly tender to palpation in epigastric region, otherwise non-tender with no rebound or guarding, bowel sounds present Ext: 2+ radial pulses bilaterally, no LE edema Psych: AAOx3, denies SI, HI, AVH  Brief Hospital Course: Jacob Adams is a 48 year old male who presented with abdominal pain, recent blood in stool, and chest discomfort. PMH is significant for HTN, GERD, pancreatitis, depression with psychotic features, and alcohol abuse.  Epigastric pain: This was likely GERD +/- gastritis associated with alcohol ingestion. Pancreatitis was unlikely as the patient had a normal lipase and tolerated food well. An infectious cause was unlikely as the patient remained afebrile and had no leukocytosis. GI was consulted and performed an EGD that revealed no evidence of Mallory-Weiss tear, varices, esophagitis, or PUD.  The patient's epigastric pain improved daily and at the time of discharge was resolved. Per GI recs he will be discharged on Protonix 40 mg po, which he will take BID for 6 weeks and then switched to QD.  Recent h/o blood in stool: The patient endorsed a recent history of gross  blood in stool that looked like a blood clot. This was likely due to a prolapsing internal hemorrhoid. This was treated with Anusol hydrocortisone suppositories. The patient had no further episodes of blood in stool or per rectum during his hospitalization, hemoglobin stayed at his baseline of 14-15, and remained hemodynamically stable.  Chest discomfort: The patient was placed on telemetry and had an EKG, which was unremarkable. He also had negative troponins x3. Likely linked to epigastric pain as was in same area and was reproducible on palpation. CAD risk stratification labs were done which revealed HbA1c of 5.7 and a 10-yr ASCVD risk of 8.6% (LDL 222, HDL 79). The patient was started on atorvastatin 40 mg and a baby aspirin daily. The patient remained hemodynamically stable and his chest discomfort resolved.   Alcohol abuse: On admission the patient was given a banana bag and placed on CIWA protocol. His highest CIWA score was 1. He expressed interest in quitting alcohol, and was particularly interested in inpatient alcohol detox/rehab. Social work was consulted who provided him with a list of options, and he stated he planned to contact facilities to try and find one that would admit him.  Depression with psychotic features: The patient had been on Haldol for auditory hallucinations, which he described as voices talking over each other. He uses alcohol as a way to drown them out. He has also been on Prozac, trazodone, and gabapentin. The patient had been off these medications for about 4 weeks since he did not have refills. We restarted these medications, with the trazodone and Prozac at lower doses (see below, medication changes). The patient will follow-up with his psychiatrist for further management.  Medication changes - Started atorvastatin 40 mg daily for CAD prophylaxis - Restarted gabapentin 200 mg BID, trazodone 50 mg QHS, Haldol 5 mg daily, Prozac 20 mg daily - Changed patient's PPI to  Protonix 40 mg po. Per GI recs he will take this BID for 6 weeks and then transition to QD.  Issues for Follow Up:  - Encourage patient to go to inpatient alcohol rehab if he has not done so - Psychiatry: follow up how he is doing in regards to restarting Haldol and Prozac - Follow up to see if patient has had any more blood in stool - Follow up to see if patient's epigastric pain has resolved  Significant Procedures: EGD  Significant Labs and Imaging:   Recent Labs Lab 07/26/14 1421 07/27/14 0520  WBC 4.8 4.9  HGB 15.0 14.4  HCT 42.0 42.4  PLT 209 209    Recent Labs Lab 07/26/14 1421 07/27/14 0520  NA 136* 136*  K 4.0 3.7  CL 101 101  CO2 19 22  GLUCOSE 93 100*  BUN 11 11  CREATININE 0.73 0.90  CALCIUM 8.5 8.2*  ALKPHOS 51  --   AST 24  --   ALT 14  --   ALBUMIN 3.3*  --    Lipase: 44 INR: 1.08 Lipids: Tcholesterol 222, TGs 105, HDL 79, LDL 122 HbA1c (07/27/2014): 5.7  Results/Tests Pending at Time of Discharge: None  Discharge Medications:    Medication List    STOP taking these medications       omeprazole 20 MG capsule  Commonly known as:  PRILOSEC     sertraline 100 MG tablet  Commonly known as:  ZOLOFT      TAKE these medications       amLODipine 10 MG tablet  Commonly known as:  NORVASC  Take 5 mg by mouth at bedtime.     atorvastatin 40 MG tablet  Commonly known as:  LIPITOR  Take 1 tablet (40 mg total) by mouth daily.     cholecalciferol 1000 UNITS tablet  Commonly known as:  VITAMIN D  Take 1,000 Units by mouth at bedtime.     FLUoxetine 20 MG capsule  Commonly known as:  PROZAC  Take 1 capsule (20 mg total) by mouth daily.     gabapentin 100 MG capsule  Commonly known as:  NEURONTIN  Take 2 capsules (200 mg total) by mouth 2 (two) times daily. For mood stability     haloperidol 5 MG tablet  Commonly known as:  HALDOL  Take 1 tablet (5 mg total) by mouth at bedtime.     hydrocortisone 25 MG suppository  Commonly known as:   ANUSOL-HC  Place 1 suppository (25 mg total) rectally 2 (two) times daily.     pantoprazole 40 MG tablet  Commonly known as:  PROTONIX  Take 1 tablet (40 mg total) by mouth 2 (two) times daily.     traZODone 100 MG tablet  Commonly known as:  DESYREL  Take 1 tablet (100 mg total) by mouth at bedtime as needed and may repeat dose one time if needed for sleep.        Discharge Instructions: Please refer to Patient Instructions section of EMR for full details.  Patient was counseled important signs and symptoms that should prompt return to medical care, changes in medications, dietary instructions, activity restrictions, and follow up appointments.   Follow-Up Appointments: Follow-up Information   [facility].  Follow up with Your psychiatrist In 1 week.      Schedule an appointment as soon as possible for a visit with Freddy Jaksch, MD.   Specialty:  Gastroenterology   Contact information:   [address], [address] [place] [address] [phone]     The patient will make follow-up appointments with his PCP and Psychiatrist at the [facility], MD 07/28/2014, 7:17 PM Pager: [phone]
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
## 2014-07-27 NOTE — Progress Notes (Signed)
Family Medicine Teaching Service Daily Progress Note Med Student Pager: [phone]  Patient name: Jacob Adams Medical record number: [id number] Date of birth: July 01, 1966 Age: 48 y.o. Gender: male  Primary Care Provider: PROVIDER NOT IN SYSTEM Consultants: GI Code Status: Full  Pt Overview and Major Events to Date:  10/7: admitted for epigastric pain and hematochezia, placed on tele 10/8: GI consulted, awaiting recs; tele dc'ed as neg troponins x3, unremarkable EKG and chest pain resolved  Assessment and Plan:  Jacob Adams is a 48 y.o. male presenting with abdominal pain, rectal bleeding, and chest discomfort. PMH is significant for HTN, acid reflux, pancreatitis, headches, depression, mental disorder, and EtOH abuse.   Epigastric abdominal pain and hematochezia Pain improved, patient has not had repeat blood per rectum, hemoglobin stable. Likely gastritis 2/2 alcohol ingestion. GI consulted and will see patient to determine if gets EGD in hospital or as outpatient to w/u PUD, varices. Variceal bleed less likely as Pt has normal INR and platelets. Infectious cause of abdominal pain unlikely as Pt afebrile, no leukocytosis. Lipase negative and CMET normal. - Switched Protonix from IV to PO, 40 mg daily - Morphine 1 mg IV q3 PRN  - Restart Norvasc  Chest discomfort  Some typical features present such as substernal and worsened with activity or stress; however, chest wall tender and with epigastric tenderness this could also be related to GI process. Stable EKG. UDS negative, trop neg x3; lipase and CMET normal. CAD risk stratification: HbA1c 5.7, 10-yr ASCVD risk 8.6% (LDL 222, HDL 79). - Start atorvastatin 40 mg and daily ASA 81 mg - Protonix as above - Morphine as above  Alcoholism  Albumin 3.3, likely chronically mildly malnourished. UDS neg. Alc level mildly elevated. Patient is interested in quitting alcohol. - CIWA protocol  - SW consulted for resources to quit  drinking  Depression Endorsed SI in triage but passive, no longer endorsing any SI and states he just occasionally wants to feel "quiet." Denies HI. Has been on Haldol for auditory hallucinations, which he describes as voices talking over each other which he drowns out with alcohol, and was switched from Zoloft to Prozac at last Behavioral Health discharge on 09/02/2013. Has been off his psych meds for about 4 weeks since he didn't have refills. - Request sitter if patient becomes actively suicidal with plan or intent.  - Will restart Haldol for AH and Prozac at lower dose - Continue Gabapentin 200mg  BID for mood stability and trazodone at lower dose.   FEN/GI: SLIV; Heart healthy diet for now, will make NPO PPx: Lovenox  Disposition: Admitted to FMTS. GI to to see and determine if will get EGD inpatient or ready for discharge with EGD as outpatient   Subjective: Pain improved today, has not had more blood per rectum. Chest pain gone. Denies dizziness, weakness, SI, HI, AVH.  Objective: Temp:  [98.2 F (36.8 C)-98.6 F (37 C)] 98.3 F (36.8 C) (10/08 1200) Pulse Rate:  [69-98] 69 (10/08 1200) Resp:  [13-18] 16 (10/08 1200) BP: (114-140)/(70-93) 136/93 mmHg (10/08 1200) SpO2:  [95 %-100 %] 100 % (10/08 1200) Weight:  [233 lb 6.4 oz (105.87 kg)] 233 lb 6.4 oz (105.87 kg) (10/07 1926) Physical Exam: General: sitting in bed, NAD Cardiovascular: RRR, no m/r/g Respiratory: CTAB, normal work of breathing Abdomen: soft, non-distended, tender to palpation in epigastric area, no rebound or guarding, bowel sounds present Extremities: no LE edema  Laboratory:  Recent Labs Lab 07/26/14 1421 07/27/14 0520  WBC 4.8  4.9  HGB 15.0 14.4  HCT 42.0 42.4  PLT 209 209    Recent Labs Lab 07/26/14 1421 07/27/14 0520  NA 136* 136*  K 4.0 3.7  CL 101 101  CO2 19 22  BUN 11 11  CREATININE 0.73 0.90  CALCIUM 8.5 8.2*  PROT 6.6  --   BILITOT 0.3  --   ALKPHOS 51  --   ALT 14  --   AST  24  --   GLUCOSE 93 100*    Arn Medal, Med Student 07/27/2014, 1:57 PM Pager: [phone], text pages welcome   Upper Level Addendum  I have seen and examined the patient. I have read and agree with the above note. My changes are noted in blue.   Assessment and Plan:  Epigastric abdominal pain: Improved but still present and intermittent. Most likely gastritis. Lipase within normal limits.  - follow-up GI recommendations. Possible EGD inpatient vs outpatient. Appreciate recommendations - Morphine 1mg  q3hrs for pain - Protonix 40mg  PO  Hematochezia: one episode. Has not had any recurrence so far. Hemoglobin has been stable.  Chest pain: troponin trended and negative. EKG without significant change from previous - continue morphine - atorvastatin 40mg  daily - aspirin 81mg  daily  Alcoholism: not requiring ativan. CIWA score as high as 1 - continue CIWA - follow-up SW consult for resources  Depression: history of in past. Appears to have flat affect. Currently not on medication but was on Prozac in the past.  - Prozac 20mg  daily  FEN/GI: SLIV, heart healthy diet PPx: Lovenox  Disposition: Pending workup by GI  Subjective:  Patient says pain is improved but still present. Nothing worsens pain but at times it will worsen. Morphine helping. No emesis. No chest pain or shortness of breath.  Objective: Temp:  [98.2 F (36.8 C)-98.6 F (37 C)] 98.2 F (36.8 C) (10/08 1534) Pulse Rate:  [69-86] 74 (10/08 1703) Resp:  [16-18] 18 (10/08 1534) BP: (126-136)/(83-93) 131/91 mmHg (10/08 1703) SpO2:  [97 %-100 %] 97 % (10/08 1534) Weight:  [233 lb 6.4 oz (105.87 kg)] 233 lb 6.4 oz (105.87 kg) (10/07 1926) Physical Exam: General: Well appearing in no distress Cardiovascular: Regular rate and rhythm, no murmur Respiratory: Clear to auscultation bilaterally, no wheezes Abdomen: Soft, epigastric tenderness Extremities: No edema or tenderness  Jacquelin Hawking, MD 07/27/2014, 5:55  PM PGY-2, [place] [facility] FPTS Intern pager: [phone], text pages welcome

## 2014-07-27 NOTE — H&P (Signed)
FMTS Attending Admit Note Patient seen and examined by me, discussed with resident team and I agree with Dr Landry Corporal note as documented. Patient presents to ED with complaint of seeing bright red blood with bowel movement for at least 3 consecutive days prior to admission; also with some history of seeing blood in vomitus on day prior to presentation Jacob Adams, Oct 6th). Denies recent fevers, chills, shortness of breath or syncope/presyncope.  He reports abdominal pain in epigastrium.  Patient reports drinking 1-2 40oz beers per day; last drink was one 40oz beer on the morning of presentation. Prior history of pancreatitis in the 1990s, none since. Lipase not elevated at admission.  This morning he reports feeling relatively well, still some mild epigastric pain but no further blood per rectum or emesis. Is mildly hungry.  Describes his affect as being depressed most of the time, concerned about his parole, child care issues, and his health. Denies recreational drugs other than alcohol. He does not use NSAIDs. On exam he has a flat affect but in no distress.  Neck supple.  ABD Audible bowel sounds. No organomegaly. Tender in epigastrium.  Clear lung exam bilaterally, COR regular S1S2  A/P: Patient with recent GIB and likely gastritis secondary to alcohol use. At risk for esophageal varices.  His Hgb has remained stable as have his vital signs.  Patient of [facility] and reports difficulty getting in for appointments there. Would consult GI while inpatient for their thoughts on need for inpatient EGD versus deferring for outpatient evaluation. High-dose PPI now and on discharge. CIWA protocol while inpatient. Question whether BB indicated (propranolol) to decrease risk of esophageal variceal bleed.  Jacob Mayotte, MD

## 2014-07-28 ENCOUNTER — Observation Stay (HOSPITAL_COMMUNITY): Payer: Non-veteran care | Admitting: Anesthesiology

## 2014-07-28 ENCOUNTER — Encounter (HOSPITAL_COMMUNITY): Payer: Self-pay | Admitting: *Deleted

## 2014-07-28 ENCOUNTER — Encounter (HOSPITAL_COMMUNITY)
Admission: EM | Disposition: A | Discharge status: Home / Self Care | Payer: Self-pay | Source: Home / Self Care | Attending: Emergency Medicine

## 2014-07-28 ENCOUNTER — Encounter (HOSPITAL_COMMUNITY): Payer: Non-veteran care | Admitting: Anesthesiology

## 2014-07-28 DIAGNOSIS — F333 Major depressive disorder, recurrent, severe with psychotic symptoms: Secondary | ICD-10-CM

## 2014-07-28 HISTORY — PX: ESOPHAGOGASTRODUODENOSCOPY (EGD) WITH PROPOFOL: SHX5813

## 2014-07-28 SURGERY — ESOPHAGOGASTRODUODENOSCOPY (EGD) WITH PROPOFOL
Anesthesia: Monitor Anesthesia Care | Laterality: Left

## 2014-07-28 MED ORDER — HALOPERIDOL 5 MG PO TABS: 5.0000 mg | ORAL_TABLET | Freq: Every day | ORAL | Status: DC

## 2014-07-28 MED ORDER — PANTOPRAZOLE SODIUM 40 MG PO TBEC: 40.0000 mg | DELAYED_RELEASE_TABLET | Freq: Two times a day (BID) | ORAL | Status: DC

## 2014-07-28 MED ORDER — HYDROCORTISONE ACETATE 25 MG RE SUPP: 25.0000 mg | Freq: Two times a day (BID) | RECTAL | Status: DC

## 2014-07-28 MED ORDER — TRAZODONE HCL 100 MG PO TABS: 100.0000 mg | ORAL_TABLET | Freq: Every evening | ORAL | Status: DC | PRN

## 2014-07-28 MED ORDER — ACETAMINOPHEN 325 MG PO TABS
650.0000 mg | ORAL_TABLET | Freq: Once | ORAL | Status: AC
  Administered 2014-07-28: 650 mg via ORAL
  Filled 2014-07-28: qty 2

## 2014-07-28 MED ORDER — ATORVASTATIN CALCIUM 40 MG PO TABS: 40.0000 mg | ORAL_TABLET | Freq: Every day | ORAL | Status: DC

## 2014-07-28 MED ORDER — DOCUSATE SODIUM 100 MG PO CAPS: 100.0000 mg | ORAL_CAPSULE | Freq: Two times a day (BID) | ORAL | Status: DC

## 2014-07-28 MED ORDER — FENTANYL CITRATE 0.05 MG/ML IJ SOLN
INTRAMUSCULAR | Status: DC | PRN
  Administered 2014-07-28: 50 ug via INTRAVENOUS

## 2014-07-28 MED ORDER — NICOTINE 14 MG/24HR TD PT24
14.0000 mg | MEDICATED_PATCH | Freq: Every day | TRANSDERMAL | Status: DC
  Administered 2014-07-28: 14 mg via TRANSDERMAL
  Filled 2014-07-28: qty 1

## 2014-07-28 MED ORDER — MIDAZOLAM HCL 5 MG/5ML IJ SOLN
INTRAMUSCULAR | Status: DC | PRN
  Administered 2014-07-28: 2 mg via INTRAVENOUS

## 2014-07-28 MED ORDER — FLUOXETINE HCL 20 MG PO CAPS: 20.0000 mg | ORAL_CAPSULE | Freq: Every day | ORAL | Status: DC

## 2014-07-28 MED ORDER — LACTATED RINGERS IV SOLN
INTRAVENOUS | Status: DC
  Administered 2014-07-28: 1000 mL via INTRAVENOUS

## 2014-07-28 MED ORDER — HALOPERIDOL 5 MG PO TABS
5.0000 mg | ORAL_TABLET | Freq: Every day | ORAL | Status: DC
  Filled 2014-07-28: qty 1

## 2014-07-28 MED ORDER — LACTATED RINGERS IV SOLN
INTRAVENOUS | Status: DC | PRN
  Administered 2014-07-28: 14:00:00 via INTRAVENOUS

## 2014-07-28 MED ORDER — PROPOFOL INFUSION 10 MG/ML OPTIME
INTRAVENOUS | Status: DC | PRN
Start: 2014-07-28 — End: 2014-07-28
  Administered 2014-07-28: 120 ug/kg/min via INTRAVENOUS

## 2014-07-28 MED ORDER — ATORVASTATIN CALCIUM 40 MG PO TABS
40.0000 mg | ORAL_TABLET | Freq: Every day | ORAL | Status: DC
  Filled 2014-07-28: qty 1

## 2014-07-28 MED ORDER — FLUOXETINE HCL 20 MG PO CAPS
20.0000 mg | ORAL_CAPSULE | Freq: Every day | ORAL | Status: DC
  Administered 2014-07-28: 20 mg via ORAL
  Filled 2014-07-28 (×2): qty 1

## 2014-07-28 NOTE — Progress Notes (Deleted)
Entered in error

## 2014-07-28 NOTE — Transfer of Care (Signed)
Immediate Anesthesia Transfer of Care Note  Patient: Jacob Adams  Procedure(s) Performed: Procedure(s): ESOPHAGOGASTRODUODENOSCOPY (EGD) WITH PROPOFOL (Left)  Patient Location: PACU  Anesthesia Type:MAC  Level of Consciousness: awake, alert  and oriented  Airway & Oxygen Therapy: Patient Spontanous Breathing  Post-op Assessment: Report given to PACU RN  Post vital signs: Reviewed and stable  Complications: No apparent anesthesia complications

## 2014-07-28 NOTE — Interval H&P Note (Signed)
History and Physical Interval Note:  07/28/2014 1:49 PM  Jacob Adams  has presented today for surgery, with the diagnosis of Epigastric pain, nausea, vomiting, hematemesis  The various methods of treatment have been discussed with the patient and family. After consideration of risks, benefits and other options for treatment, the patient has consented to  Procedure(s): ESOPHAGOGASTRODUODENOSCOPY (EGD) WITH PROPOFOL (Left) as a surgical intervention .  The patient's history has been reviewed, patient examined, no change in status, stable for surgery.  I have reviewed the patient's chart and labs.  Questions were answered to the patient's satisfaction.     Jacob Adams M  Assessment:  1.  Epigastric abdominal pain. 2.  ? Hematemesis  Plan:  1.  Endoscopy. 2.  Risks (bleeding, infection, bowel perforation that could require surgery, sedation-related changes in cardiopulmonary systems), benefits (identification and possible treatment of source of symptoms, exclusion of certain causes of symptoms), and alternatives (watchful waiting, radiographic imaging studies, empiric medical treatment) of upper endoscopy (EGD) were explained to patient/family in detail and patient wishes to proceed.

## 2014-07-28 NOTE — ED Provider Notes (Signed)
Medical screening examination/treatment/procedure(s) were performed by non-physician practitioner and as supervising physician I was immediately available for consultation/collaboration.   EKG Interpretation   Date/Time:  Wednesday July 26 2014 14:05:02 EDT Ventricular Rate:  98 PR Interval:  192 QRS Duration: 92 QT Interval:  364 QTC Calculation: 464 R Axis:   -46 Text Interpretation:  Normal sinus rhythm Left anterior fascicular block  Abnormal ECG No significant change since last tracing Confirmed by  Christy Gentles  MD, Nikolaj Geraghty ([id number]) on 07/26/2014 2:10:00 PM        Sharyon Cable, MD 07/28/14 [phone]

## 2014-07-28 NOTE — Discharge Instructions (Signed)
You were admitted for abdominal pain.  All of the studies of your stomach were normal.  You should take Protonix 69m twice daily for the next 6 weeks and then go back to once daily.  You should try to stop drinking alcohol.  You were given information about inpatient rehab programs.   Abdominal Pain Many things can cause abdominal pain. Usually, abdominal pain is not caused by a disease and will improve without treatment. It can often be observed and treated at home. Your health care provider will do a physical exam and possibly order blood tests and X-rays to help determine the seriousness of your pain. However, in many cases, more time must pass before a clear cause of the pain can be found. Before that point, your health care provider may not know if you need more testing or further treatment. HOME CARE INSTRUCTIONS  Monitor your abdominal pain for any changes. The following actions may help to alleviate any discomfort you are experiencing:  Only take over-the-counter or prescription medicines as directed by your health care provider.  Do not take laxatives unless directed to do so by your health care provider.  Try a clear liquid diet (broth, tea, or water) as directed by your health care provider. Slowly move to a bland diet as tolerated. SEEK MEDICAL CARE IF:  You have unexplained abdominal pain.  You have abdominal pain associated with nausea or diarrhea.  You have pain when you urinate or have a bowel movement.  You experience abdominal pain that wakes you in the night.  You have abdominal pain that is worsened or improved by eating food.  You have abdominal pain that is worsened with eating fatty foods.  You have a fever. SEEK IMMEDIATE MEDICAL CARE IF:   Your pain does not go away within 2 hours.  You keep throwing up (vomiting).  Your pain is felt only in portions of the abdomen, such as the right side or the left lower portion of the abdomen.  You pass bloody or  black tarry stools. MAKE SURE YOU:  Understand these instructions.   Will watch your condition.   Will get help right away if you are not doing well or get worse.  Document Released: 07/16/2005 Document Revised: 10/11/2013 Document Reviewed: 06/15/2013 [facility] HospitalPatient Information 2015 [place] This information is not intended to replace advice given to you by your health care provider. Make sure you discuss any questions you have with your health care provider.

## 2014-07-28 NOTE — Progress Notes (Signed)
Jacob Adams to be D/C'd Home per MD order.  Discussed with the patient and all questions fully answered.    Medication List    STOP taking these medications       omeprazole 20 MG capsule  Commonly known as:  PRILOSEC     sertraline 100 MG tablet  Commonly known as:  ZOLOFT      TAKE these medications       amLODipine 10 MG tablet  Commonly known as:  NORVASC  Take 5 mg by mouth at bedtime.     atorvastatin 40 MG tablet  Commonly known as:  LIPITOR  Take 1 tablet (40 mg total) by mouth daily.     cholecalciferol 1000 UNITS tablet  Commonly known as:  VITAMIN D  Take 1,000 Units by mouth at bedtime.     FLUoxetine 20 MG capsule  Commonly known as:  PROZAC  Take 1 capsule (20 mg total) by mouth daily.     gabapentin 100 MG capsule  Commonly known as:  NEURONTIN  Take 2 capsules (200 mg total) by mouth 2 (two) times daily. For mood stability     haloperidol 5 MG tablet  Commonly known as:  HALDOL  Take 1 tablet (5 mg total) by mouth at bedtime.     hydrocortisone 25 MG suppository  Commonly known as:  ANUSOL-HC  Place 1 suppository (25 mg total) rectally 2 (two) times daily.     pantoprazole 40 MG tablet  Commonly known as:  PROTONIX  Take 1 tablet (40 mg total) by mouth 2 (two) times daily.     traZODone 100 MG tablet  Commonly known as:  DESYREL  Take 1 tablet (100 mg total) by mouth at bedtime as needed and may repeat dose one time if needed for sleep.        VVS, Skin clean, dry and intact without evidence of skin break down, no evidence of skin tears noted. IV catheter discontinued intact. Site without signs and symptoms of complications. Dressing and pressure applied.  An After Visit Summary was printed and given to the patient.  D/c education completed with patient/family including follow up instructions, medication list, d/c activities limitations if indicated, with other d/c instructions as indicated by MD - patient able to verbalize understanding,  all questions fully answered.   Patient instructed to return to ED, call 911, or call MD for any changes in condition.   Patient escorted via [place], and D/C home via private auto.  Jacob Adams D 07/28/2014 5:24 PM

## 2014-07-28 NOTE — Care Management Note (Signed)
    Page 1 of 1   07/28/2014     3:56:49 PM CARE MANAGEMENT NOTE 07/28/2014  Patient:  Jacob Adams, Jacob Adams   Account Number:  [id number]  Date Initiated:  07/28/2014  Documentation initiated by:  Tomi Bamberger  Subjective/Objective Assessment:   dx chest pain,  admit- from home.     Action/Plan:   Anticipated DC Date:  07/28/2014   Anticipated DC Plan:  [place]  CM consult      Choice offered to / List presented to:             Status of service:  Completed, signed off Medicare Important Message given?  NO (If response is "NO", the following Medicare IM given date fields will be blank) Date Medicare IM given:   Medicare IM given by:   Date Additional Medicare IM given:   Additional Medicare IM given by:    Discharge Disposition:  HOME/SELF CARE  Per UR Regulation:  Reviewed for med. necessity/level of care/duration of stay  If discussed at [place] of Stay Meetings, dates discussed:    Comments:  07/28/14 [place], BSN [phone] NCM just notified at 3:30 that patient will be dc today and will need 10 medications from [place], NCM instructed MD to give patient his dc summary and scripts and patient will need to take to the [place] so he can get these filled.  NCM unable to reach [place].

## 2014-07-28 NOTE — Progress Notes (Signed)
MD- Please let case manager know medications patient will be discharging with so the [facility] hospital can get the medications together. Sometimes it takes a day or more to get medications ready for patient.

## 2014-07-28 NOTE — Progress Notes (Signed)
Family Medicine Teaching Service Attending Note  I interviewed and examined patient Jacob Adams and reviewed their tests and x-rays.  I discussed with Dr. Lonny Prude and reviewed their note for today.  I agree with their assessment and plan.     Additionally  Stable For endo to rule out varices Interested in alcohol rehab

## 2014-07-28 NOTE — Anesthesia Preprocedure Evaluation (Signed)
Anesthesia Evaluation  Patient identified by MRN, date of birth, ID band Patient awake    Reviewed: Allergy & Precautions, H&P , NPO status , Patient's Chart, lab work & pertinent test results  Airway Mallampati: I TM Distance: >3 FB Neck ROM: Full    Dental  (+) Dental Advisory Given, Partial Upper, Teeth Intact   Pulmonary neg sleep apnea, Current Smoker,  breath sounds clear to auscultation        Cardiovascular hypertension, Pt. on medications Rhythm:Regular Rate:Normal     Neuro/Psych    GI/Hepatic GERD-  Medicated and Controlled,  Endo/Other    Renal/GU      Musculoskeletal   Abdominal   Peds  Hematology   Anesthesia Other Findings   Reproductive/Obstetrics                           Anesthesia Physical Anesthesia Plan  ASA: II  Anesthesia Plan: MAC   Post-op Pain Management:    Induction: Intravenous  Airway Management Planned: Simple Face Mask  Additional Equipment:   Intra-op Plan:   Post-operative Plan:   Informed Consent: I have reviewed the patients History and Physical, chart, labs and discussed the procedure including the risks, benefits and alternatives for the proposed anesthesia with the patient or authorized representative who has indicated his/her understanding and acceptance.   Dental advisory given  Plan Discussed with: CRNA, Anesthesiologist and Surgeon  Anesthesia Plan Comments:         Anesthesia Quick Evaluation

## 2014-07-28 NOTE — Anesthesia Postprocedure Evaluation (Signed)
  Anesthesia Post-op Note  Patient: Jacob Adams  Procedure(s) Performed: Procedure(s): ESOPHAGOGASTRODUODENOSCOPY (EGD) WITH PROPOFOL (Left)  Patient Location: PACU  Anesthesia Type:MAC  Level of Consciousness: awake, alert  and oriented  Airway and Oxygen Therapy: Patient Spontanous Breathing  Post-op Pain: none  Post-op Assessment: Post-op Vital signs reviewed, Patient's Cardiovascular Status Stable, Respiratory Function Stable and Patent Airway  Post-op Vital Signs: Reviewed and stable  Last Vitals:  Filed Vitals:   07/28/14 1448  BP: 152/119  Pulse: 83  Temp:   Resp: 28    Complications: No apparent anesthesia complications

## 2014-07-28 NOTE — Anesthesia Postprocedure Evaluation (Signed)
  Anesthesia Post-op Note  Patient: Jacob Adams  Procedure(s) Performed: Procedure(s): ESOPHAGOGASTRODUODENOSCOPY (EGD) WITH PROPOFOL (Left)  Patient Location: PACU  Anesthesia Type: MAC   Level of Consciousness: awake, alert  and oriented  Airway and Oxygen Therapy: Patient Spontanous Breathing  Post-op Pain: none  Post-op Assessment: Post-op Vital signs reviewed  Post-op Vital Signs: Reviewed  Last Vitals:  Filed Vitals:   07/28/14 1500  BP: 136/104  Pulse: 64  Temp:   Resp: 16    Complications: No apparent anesthesia complications
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
## 2014-07-28 NOTE — Progress Notes (Signed)
UR completed 

## 2014-07-28 NOTE — Op Note (Signed)
[facility] [place], [id number]   ENDOSCOPY PROCEDURE REPORT  PATIENT: Jacob Adams, Jacob Adams  MR#: [id number] BIRTHDATE: [birth year] , 1  yrs. old GENDER: male ENDOSCOPIST: Arta Silence, MD REFERRED BY:  Family Medicine Teaching Service, M.D. PROCEDURE DATE:  Aug 26, 2014 PROCEDURE:  EGD, diagnostic ASA CLASS:     Class II INDICATIONS:  epigastric abdominal pain.   nausea.   vomiting. hematemesis. MEDICATIONS: Monitored anesthesia care TOPICAL ANESTHETIC:  DESCRIPTION OF PROCEDURE: After the risks benefits and alternatives of the procedure were thoroughly explained, informed consent was obtained.  The Pentax Gastroscope [id number] endoscope was introduced through the mouth and advanced to the second portion of the duodenum. The instrument was slowly withdrawn as the mucosa was fully examined.     Findings:  Normal esophagus; no evidence of esophagitis, Mallory-Weiss Tear, or varices.    Normal stomach and pylorus. Normal duodenum to the second portion.  No old or fresh blood was seen to the extent of our examination.            The scope was then withdrawn from the patient and the procedure completed.  COMPLICATIONS: There were no immediate complications.  ENDOSCOPIC IMPRESSION:     Normal endoscopy to the second portion of the duodenum.  RECOMMENDATIONS:     1.  Watch for potential complications of procedure. 2.  Pantoprazole 40 mg po bid x 6 weeks, then transition to 40 mg po qd. 3.  Anusol as needed for hematochezia, which seems to be hemorrhoidal in origin. 4.  Advance diet as tolerated. 5.  Patient can be discharged home from [facility], and patient can call our office [phone]) to make outpatient follow-up appointment.  eSigned:  Arta Silence, MD August 26, 2014 2:55 PM   CC:  CPT CODES: ICD CODES:  The ICD and CPT codes recommended by this software are interpretations from the data that the clinical staff has  captured with the software.  The verification of the translation of this report to the ICD and CPT codes and modifiers is the sole responsibility of the health care institution and practicing physician where this report was generated.  [place]. will not be held responsible for the validity of the ICD and CPT codes included on this report.  AMA assumes no liability for data contained or not contained herein. CPT is a Designer, television/film set of the [employer].

## 2014-07-30 NOTE — Progress Notes (Signed)
Patient wife called and was voicing concerns about patient discharge prescription not able to be filled until next week when patient have his appointment in [place].  Advice wife to fax prescription to to [place].

## 2014-07-31 ENCOUNTER — Encounter (HOSPITAL_COMMUNITY): Payer: Self-pay | Admitting: Gastroenterology

## 2015-05-31 ENCOUNTER — Emergency Department (HOSPITAL_COMMUNITY): Payer: Non-veteran care

## 2015-05-31 ENCOUNTER — Emergency Department (HOSPITAL_COMMUNITY)
Admission: EM | Admit: 2015-05-31 | Discharge: 2015-05-31 | Disposition: A | Discharge status: Home / Self Care | Payer: Non-veteran care | Attending: Emergency Medicine | Admitting: Emergency Medicine

## 2015-05-31 ENCOUNTER — Encounter (HOSPITAL_COMMUNITY): Payer: Self-pay | Admitting: Emergency Medicine

## 2015-05-31 DIAGNOSIS — Z720 Tobacco use: Secondary | ICD-10-CM | POA: Diagnosis not present

## 2015-05-31 DIAGNOSIS — Z8739 Personal history of other diseases of the musculoskeletal system and connective tissue: Secondary | ICD-10-CM | POA: Insufficient documentation

## 2015-05-31 DIAGNOSIS — S02402A Zygomatic fracture, unspecified, initial encounter for closed fracture: Secondary | ICD-10-CM | POA: Insufficient documentation

## 2015-05-31 DIAGNOSIS — F329 Major depressive disorder, single episode, unspecified: Secondary | ICD-10-CM | POA: Diagnosis not present

## 2015-05-31 DIAGNOSIS — Y9289 Other specified places as the place of occurrence of the external cause: Secondary | ICD-10-CM | POA: Diagnosis not present

## 2015-05-31 DIAGNOSIS — S0990XA Unspecified injury of head, initial encounter: Secondary | ICD-10-CM | POA: Diagnosis present

## 2015-05-31 DIAGNOSIS — Y9389 Activity, other specified: Secondary | ICD-10-CM | POA: Insufficient documentation

## 2015-05-31 DIAGNOSIS — G43909 Migraine, unspecified, not intractable, without status migrainosus: Secondary | ICD-10-CM | POA: Diagnosis not present

## 2015-05-31 DIAGNOSIS — I10 Essential (primary) hypertension: Secondary | ICD-10-CM | POA: Insufficient documentation

## 2015-05-31 DIAGNOSIS — Z79899 Other long term (current) drug therapy: Secondary | ICD-10-CM | POA: Insufficient documentation

## 2015-05-31 DIAGNOSIS — Y998 Other external cause status: Secondary | ICD-10-CM | POA: Diagnosis not present

## 2015-05-31 DIAGNOSIS — F419 Anxiety disorder, unspecified: Secondary | ICD-10-CM | POA: Diagnosis not present

## 2015-05-31 DIAGNOSIS — S01112A Laceration without foreign body of left eyelid and periocular area, initial encounter: Secondary | ICD-10-CM | POA: Diagnosis not present

## 2015-05-31 DIAGNOSIS — S50311A Abrasion of right elbow, initial encounter: Secondary | ICD-10-CM | POA: Diagnosis not present

## 2015-05-31 DIAGNOSIS — K219 Gastro-esophageal reflux disease without esophagitis: Secondary | ICD-10-CM | POA: Diagnosis not present

## 2015-05-31 LAB — CBC WITH DIFFERENTIAL/PLATELET
Basophils Absolute: 0 10*3/uL (ref 0.0–0.1)
Basophils Relative: 0 % (ref 0–1)
EOS PCT: 0 % (ref 0–5)
Eosinophils Absolute: 0 10*3/uL (ref 0.0–0.7)
HCT: 36.3 % — ABNORMAL LOW (ref 39.0–52.0)
Hemoglobin: 12.2 g/dL — ABNORMAL LOW (ref 13.0–17.0)
LYMPHS ABS: 1.4 10*3/uL (ref 0.7–4.0)
LYMPHS PCT: 13 % (ref 12–46)
MCH: 31 pg (ref 26.0–34.0)
MCHC: 33.6 g/dL (ref 30.0–36.0)
MCV: 92.4 fL (ref 78.0–100.0)
MONOS PCT: 4 % (ref 3–12)
Monocytes Absolute: 0.5 10*3/uL (ref 0.1–1.0)
NEUTROS PCT: 83 % — AB (ref 43–77)
Neutro Abs: 8.5 10*3/uL — ABNORMAL HIGH (ref 1.7–7.7)
PLATELETS: 308 10*3/uL (ref 150–400)
RBC: 3.93 MIL/uL — AB (ref 4.22–5.81)
RDW: 14.9 % (ref 11.5–15.5)
WBC: 10.3 10*3/uL (ref 4.0–10.5)

## 2015-05-31 LAB — TROPONIN I

## 2015-05-31 LAB — RAPID URINE DRUG SCREEN, HOSP PERFORMED
Amphetamines: NOT DETECTED
BARBITURATES: NOT DETECTED
Benzodiazepines: NOT DETECTED
Cocaine: NOT DETECTED
OPIATES: NOT DETECTED
TETRAHYDROCANNABINOL: NOT DETECTED

## 2015-05-31 LAB — ETHANOL: ALCOHOL ETHYL (B): 238 mg/dL — AB (ref <5)

## 2015-05-31 LAB — LIPASE, BLOOD: Lipase: 60 U/L — ABNORMAL HIGH (ref 22–51)

## 2015-05-31 MED ORDER — TETANUS-DIPHTH-ACELL PERTUSSIS 5-2.5-18.5 LF-MCG/0.5 IM SUSP
0.5000 mL | Freq: Once | INTRAMUSCULAR | Status: AC
  Administered 2015-05-31: 0.5 mL via INTRAMUSCULAR
  Filled 2015-05-31: qty 0.5

## 2015-05-31 MED ORDER — SODIUM CHLORIDE 0.9 % IV BOLUS (SEPSIS)
1000.0000 mL | Freq: Once | INTRAVENOUS | Status: AC
  Administered 2015-05-31: 1000 mL via INTRAVENOUS

## 2015-05-31 MED ORDER — SODIUM CHLORIDE 0.9 % IV SOLN
1000.0000 mL | Freq: Once | INTRAVENOUS | Status: AC
  Administered 2015-05-31: 1000 mL via INTRAVENOUS

## 2015-05-31 MED ORDER — OXYCODONE HCL 5 MG PO TABS: 5.0000 mg | ORAL_TABLET | Freq: Two times a day (BID) | ORAL | Status: DC | PRN

## 2015-05-31 MED ORDER — MORPHINE SULFATE 4 MG/ML IJ SOLN
6.0000 mg | Freq: Once | INTRAMUSCULAR | Status: AC
  Administered 2015-05-31: 6 mg via INTRAVENOUS
  Filled 2015-05-31: qty 2

## 2015-05-31 NOTE — ED Notes (Signed)
Patient wants to speak with SW.  Jarrett Soho called and informed.

## 2015-05-31 NOTE — ED Notes (Signed)
Dermabond placed at bedside. 

## 2015-05-31 NOTE — ED Notes (Signed)
Patient is resting comfortably. 

## 2015-05-31 NOTE — ED Notes (Signed)
Patient ambulating in the room without apparent difficulty.  Alert and oriented, VSS, no acute distress noted, patient stable for discharge.

## 2015-05-31 NOTE — ED Notes (Signed)
IV in left arm d/c per Josefina Do. Catheter intact. Skin intact. No bleeding. Site dry. Bandage applied.
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
## 2015-05-31 NOTE — ED Notes (Signed)
Pt being discharged- as pt sat up he began feeling faint and hot stating he felt like he was going to pass out. EDP made aware. Will give pt 1L fluid bolus then reassess.

## 2015-05-31 NOTE — ED Notes (Signed)
Dr. Claudine Mouton at bedside to [facility] pts eye.
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
## 2015-05-31 NOTE — Progress Notes (Signed)
LCSW met with patient per request. Patient requesting that he be transferred medically to the [facility] in [place]. He reports his face is messed up and he needs further treatment. LCSW explained that MD has determined patient does not need any additional treatment and will be discharged. Patient reports he wants to go to the [facility] [address] but has no way up there.  LCSW explained patient would have to find his own transportion as recommendation is not to be transferred to the [facility] [place]. Patient reports he cannot pay for his medication at this time, he does have a VA benefit thus CM would not be able to assist with medication MATCH.  CM working to find alternative options for assistance with medications through the [facility] and [facility]. Patient does have a pharmacy in [place] off of [address]. [facility] will supply medication for 4.00 with good [facility] card.   Patient reports he has no money to buy medications at this time. Unknown reason why as he does have a VA benefit and check monthly.  Patient had appointment in [facility], but patient missed his appointment in last October which is why he has to continue at the [facility] [place] in [place], MSW Clinical Social Work: Emergency Room [phone]

## 2015-10-29 ENCOUNTER — Emergency Department (HOSPITAL_COMMUNITY)
Admission: EM | Admit: 2015-10-29 | Discharge: 2015-10-30 | Disposition: A | Discharge status: Other Acute Inpatient Hospital | Payer: Non-veteran care | Attending: Emergency Medicine | Admitting: Emergency Medicine

## 2015-10-29 ENCOUNTER — Encounter (HOSPITAL_COMMUNITY): Payer: Self-pay | Admitting: Family Medicine

## 2015-10-29 DIAGNOSIS — F319 Bipolar disorder, unspecified: Secondary | ICD-10-CM

## 2015-10-29 DIAGNOSIS — Z7952 Long term (current) use of systemic steroids: Secondary | ICD-10-CM | POA: Insufficient documentation

## 2015-10-29 DIAGNOSIS — I10 Essential (primary) hypertension: Secondary | ICD-10-CM | POA: Diagnosis not present

## 2015-10-29 DIAGNOSIS — R45850 Homicidal ideations: Secondary | ICD-10-CM | POA: Diagnosis not present

## 2015-10-29 DIAGNOSIS — Z79899 Other long term (current) drug therapy: Secondary | ICD-10-CM | POA: Insufficient documentation

## 2015-10-29 DIAGNOSIS — Z9889 Other specified postprocedural states: Secondary | ICD-10-CM | POA: Insufficient documentation

## 2015-10-29 DIAGNOSIS — K219 Gastro-esophageal reflux disease without esophagitis: Secondary | ICD-10-CM | POA: Diagnosis not present

## 2015-10-29 DIAGNOSIS — Z8739 Personal history of other diseases of the musculoskeletal system and connective tissue: Secondary | ICD-10-CM | POA: Diagnosis not present

## 2015-10-29 DIAGNOSIS — F419 Anxiety disorder, unspecified: Secondary | ICD-10-CM | POA: Diagnosis not present

## 2015-10-29 DIAGNOSIS — Z8669 Personal history of other diseases of the nervous system and sense organs: Secondary | ICD-10-CM | POA: Insufficient documentation

## 2015-10-29 DIAGNOSIS — F17210 Nicotine dependence, cigarettes, uncomplicated: Secondary | ICD-10-CM | POA: Diagnosis not present

## 2015-10-29 DIAGNOSIS — Z880 Allergy status to penicillin: Secondary | ICD-10-CM | POA: Diagnosis not present

## 2015-10-29 DIAGNOSIS — G43909 Migraine, unspecified, not intractable, without status migrainosus: Secondary | ICD-10-CM | POA: Diagnosis not present

## 2015-10-29 DIAGNOSIS — R45851 Suicidal ideations: Secondary | ICD-10-CM | POA: Diagnosis not present

## 2015-10-29 DIAGNOSIS — F919 Conduct disorder, unspecified: Secondary | ICD-10-CM | POA: Diagnosis not present

## 2015-10-29 DIAGNOSIS — F3130 Bipolar disorder, current episode depressed, mild or moderate severity, unspecified: Secondary | ICD-10-CM | POA: Insufficient documentation

## 2015-10-29 DIAGNOSIS — Z046 Encounter for general psychiatric examination, requested by authority: Secondary | ICD-10-CM | POA: Diagnosis present

## 2015-10-29 LAB — COMPREHENSIVE METABOLIC PANEL
ALBUMIN: 4.4 g/dL (ref 3.5–5.0)
ALK PHOS: 62 U/L (ref 38–126)
ALT: 27 U/L (ref 17–63)
AST: 53 U/L — ABNORMAL HIGH (ref 15–41)
Anion gap: 12 (ref 5–15)
BILIRUBIN TOTAL: 0.7 mg/dL (ref 0.3–1.2)
BUN: 7 mg/dL (ref 6–20)
CO2: 23 mmol/L (ref 22–32)
CREATININE: 0.82 mg/dL (ref 0.61–1.24)
Calcium: 9.1 mg/dL (ref 8.9–10.3)
Chloride: 101 mmol/L (ref 101–111)
GFR calc Af Amer: >60 mL/min (ref >60)
GLUCOSE: 105 mg/dL — AB (ref 65–99)
POTASSIUM: 3.6 mmol/L (ref 3.5–5.1)
Sodium: 136 mmol/L (ref 135–145)
TOTAL PROTEIN: 8.3 g/dL — AB (ref 6.5–8.1)

## 2015-10-29 LAB — RAPID URINE DRUG SCREEN, HOSP PERFORMED
Amphetamines: NOT DETECTED
BENZODIAZEPINES: NOT DETECTED
Barbiturates: NOT DETECTED
COCAINE: NOT DETECTED
Opiates: NOT DETECTED
Tetrahydrocannabinol: NOT DETECTED

## 2015-10-29 LAB — SALICYLATE LEVEL: Salicylate Lvl: <4 mg/dL (ref 2.8–30.0)

## 2015-10-29 LAB — CBC
HEMATOCRIT: 38.9 % — AB (ref 39.0–52.0)
Hemoglobin: 12.9 g/dL — ABNORMAL LOW (ref 13.0–17.0)
MCH: 28.5 pg (ref 26.0–34.0)
MCHC: 33.2 g/dL (ref 30.0–36.0)
MCV: 85.9 fL (ref 78.0–100.0)
PLATELETS: 176 10*3/uL (ref 150–400)
RBC: 4.53 MIL/uL (ref 4.22–5.81)
RDW: 18 % — ABNORMAL HIGH (ref 11.5–15.5)
WBC: 4.6 10*3/uL (ref 4.0–10.5)

## 2015-10-29 LAB — ACETAMINOPHEN LEVEL: Acetaminophen (Tylenol), Serum: <10 ug/mL — ABNORMAL LOW (ref 10–30)

## 2015-10-29 LAB — ETHANOL: ALCOHOL ETHYL (B): 289 mg/dL — AB (ref <5)

## 2015-10-29 MED ORDER — NICOTINE 21 MG/24HR TD PT24
21.0000 mg | MEDICATED_PATCH | Freq: Every day | TRANSDERMAL | Status: DC
  Administered 2015-10-29 – 2015-10-30 (×2): 21 mg via TRANSDERMAL
  Filled 2015-10-29 (×2): qty 1

## 2015-10-29 MED ORDER — PANTOPRAZOLE SODIUM 40 MG PO TBEC
40.0000 mg | DELAYED_RELEASE_TABLET | Freq: Two times a day (BID) | ORAL | Status: DC
  Administered 2015-10-29 – 2015-10-30 (×2): 40 mg via ORAL
  Filled 2015-10-29 (×2): qty 1

## 2015-10-29 MED ORDER — GABAPENTIN 100 MG PO CAPS
200.0000 mg | ORAL_CAPSULE | Freq: Two times a day (BID) | ORAL | Status: DC
  Administered 2015-10-29 – 2015-10-30 (×2): 200 mg via ORAL
  Filled 2015-10-29 (×2): qty 2

## 2015-10-29 MED ORDER — ATORVASTATIN CALCIUM 40 MG PO TABS
40.0000 mg | ORAL_TABLET | Freq: Every day | ORAL | Status: DC
  Administered 2015-10-30: 40 mg via ORAL
  Filled 2015-10-29: qty 1

## 2015-10-29 MED ORDER — ONDANSETRON HCL 4 MG PO TABS
4.0000 mg | ORAL_TABLET | Freq: Three times a day (TID) | ORAL | Status: DC | PRN
  Filled 2015-10-29: qty 1

## 2015-10-29 MED ORDER — TRAZODONE HCL 100 MG PO TABS: 100.0000 mg | ORAL_TABLET | Freq: Every evening | ORAL | Status: DC | PRN

## 2015-10-29 MED ORDER — ZOLPIDEM TARTRATE 5 MG PO TABS
5.0000 mg | ORAL_TABLET | Freq: Every evening | ORAL | Status: DC | PRN
  Administered 2015-10-29: 5 mg via ORAL
  Filled 2015-10-29: qty 1

## 2015-10-29 MED ORDER — IBUPROFEN 200 MG PO TABS
600.0000 mg | ORAL_TABLET | Freq: Three times a day (TID) | ORAL | Status: DC | PRN
  Filled 2015-10-29: qty 3

## 2015-10-29 MED ORDER — AMLODIPINE BESYLATE 5 MG PO TABS
5.0000 mg | ORAL_TABLET | Freq: Every day | ORAL | Status: DC
  Administered 2015-10-29: 5 mg via ORAL
  Filled 2015-10-29 (×2): qty 1

## 2015-10-29 NOTE — ED Notes (Signed)
Patient has two belonings bag: blue jeans, black belt, black tennis shoes, socks, shirt x 2, black jacket, and black cell phone. Belongings and patient has been wanded.

## 2015-10-29 NOTE — ED Notes (Signed)
TTS at bedside. Provided patient a sandwich and orange juice.
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
## 2015-10-29 NOTE — ED Notes (Signed)
Provided patient another [place] sandwich, graham crackers, and peanut butter.
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
## 2015-10-30 ENCOUNTER — Inpatient Hospital Stay (HOSPITAL_COMMUNITY)
Admission: EM | Admit: 2015-10-30 | Discharge: 2015-10-31 | DRG: 885 | Disposition: A | Discharge status: Other Acute Inpatient Hospital | Payer: Federal, State, Local not specified - Other | Source: Intra-hospital | Attending: Emergency Medicine | Admitting: Emergency Medicine

## 2015-10-30 ENCOUNTER — Encounter (HOSPITAL_COMMUNITY): Payer: Self-pay | Admitting: *Deleted

## 2015-10-30 DIAGNOSIS — K219 Gastro-esophageal reflux disease without esophagitis: Secondary | ICD-10-CM | POA: Diagnosis present

## 2015-10-30 DIAGNOSIS — I10 Essential (primary) hypertension: Secondary | ICD-10-CM | POA: Diagnosis present

## 2015-10-30 DIAGNOSIS — Z818 Family history of other mental and behavioral disorders: Secondary | ICD-10-CM

## 2015-10-30 DIAGNOSIS — R45851 Suicidal ideations: Secondary | ICD-10-CM | POA: Diagnosis present

## 2015-10-30 DIAGNOSIS — F17210 Nicotine dependence, cigarettes, uncomplicated: Secondary | ICD-10-CM | POA: Diagnosis present

## 2015-10-30 DIAGNOSIS — F1024 Alcohol dependence with alcohol-induced mood disorder: Secondary | ICD-10-CM | POA: Diagnosis not present

## 2015-10-30 DIAGNOSIS — G4700 Insomnia, unspecified: Secondary | ICD-10-CM | POA: Diagnosis present

## 2015-10-30 DIAGNOSIS — K625 Hemorrhage of anus and rectum: Secondary | ICD-10-CM | POA: Diagnosis present

## 2015-10-30 DIAGNOSIS — D649 Anemia, unspecified: Secondary | ICD-10-CM | POA: Diagnosis present

## 2015-10-30 DIAGNOSIS — G4730 Sleep apnea, unspecified: Secondary | ICD-10-CM | POA: Diagnosis present

## 2015-10-30 DIAGNOSIS — F319 Bipolar disorder, unspecified: Secondary | ICD-10-CM | POA: Diagnosis present

## 2015-10-30 DIAGNOSIS — F419 Anxiety disorder, unspecified: Secondary | ICD-10-CM | POA: Diagnosis present

## 2015-10-30 DIAGNOSIS — F333 Major depressive disorder, recurrent, severe with psychotic symptoms: Principal | ICD-10-CM | POA: Diagnosis present

## 2015-10-30 DIAGNOSIS — M1990 Unspecified osteoarthritis, unspecified site: Secondary | ICD-10-CM | POA: Diagnosis present

## 2015-10-30 DIAGNOSIS — K922 Gastrointestinal hemorrhage, unspecified: Secondary | ICD-10-CM

## 2015-10-30 LAB — GLUCOSE, CAPILLARY: Glucose-Capillary: 157 mg/dL — ABNORMAL HIGH (ref 65–99)

## 2015-10-30 MED ORDER — TRAZODONE HCL 100 MG PO TABS: 100.0000 mg | ORAL_TABLET | Freq: Every evening | ORAL | Status: DC | PRN

## 2015-10-30 MED ORDER — LORAZEPAM 1 MG PO TABS: 1.0000 mg | ORAL_TABLET | Freq: Four times a day (QID) | ORAL | Status: DC | PRN

## 2015-10-30 MED ORDER — VITAMIN B-1 100 MG PO TABS
100.0000 mg | ORAL_TABLET | Freq: Every day | ORAL | Status: DC
  Filled 2015-10-30: qty 1

## 2015-10-30 MED ORDER — FLUOXETINE HCL 20 MG PO CAPS
20.0000 mg | ORAL_CAPSULE | Freq: Every day | ORAL | Status: DC
  Administered 2015-10-30: 20 mg via ORAL
  Filled 2015-10-30 (×4): qty 1

## 2015-10-30 MED ORDER — THIAMINE HCL 100 MG/ML IJ SOLN
100.0000 mg | Freq: Once | INTRAMUSCULAR | Status: AC
  Administered 2015-10-30: 100 mg via INTRAMUSCULAR

## 2015-10-30 MED ORDER — IBUPROFEN 200 MG PO TABS
600.0000 mg | ORAL_TABLET | Freq: Three times a day (TID) | ORAL | Status: DC | PRN
  Administered 2015-10-30: 600 mg via ORAL
  Filled 2015-10-30: qty 1

## 2015-10-30 MED ORDER — LORAZEPAM 1 MG PO TABS: 1.0000 mg | ORAL_TABLET | Freq: Three times a day (TID) | ORAL | Status: DC

## 2015-10-30 MED ORDER — LOPERAMIDE HCL 2 MG PO CAPS
2.0000 mg | ORAL_CAPSULE | ORAL | Status: DC | PRN
  Administered 2015-10-30: 4 mg via ORAL
  Filled 2015-10-30: qty 2

## 2015-10-30 MED ORDER — LORAZEPAM 1 MG PO TABS: 1.0000 mg | ORAL_TABLET | Freq: Every day | ORAL | Status: DC

## 2015-10-30 MED ORDER — LORAZEPAM 1 MG PO TABS
1.0000 mg | ORAL_TABLET | Freq: Four times a day (QID) | ORAL | Status: DC
  Administered 2015-10-30 (×2): 1 mg via ORAL
  Filled 2015-10-30 (×2): qty 1

## 2015-10-30 MED ORDER — LOPERAMIDE HCL 2 MG PO CAPS: 2.0000 mg | ORAL_CAPSULE | ORAL | Status: DC | PRN

## 2015-10-30 MED ORDER — ADULT MULTIVITAMIN W/MINERALS CH
1.0000 | ORAL_TABLET | Freq: Every day | ORAL | Status: DC
  Filled 2015-10-30 (×2): qty 1

## 2015-10-30 MED ORDER — ATORVASTATIN CALCIUM 40 MG PO TABS
40.0000 mg | ORAL_TABLET | Freq: Every day | ORAL | Status: DC
  Filled 2015-10-30 (×3): qty 1

## 2015-10-30 MED ORDER — ADULT MULTIVITAMIN W/MINERALS CH
1.0000 | ORAL_TABLET | Freq: Every day | ORAL | Status: DC
  Administered 2015-10-30: 1 via ORAL
  Filled 2015-10-30 (×3): qty 1

## 2015-10-30 MED ORDER — PANTOPRAZOLE SODIUM 40 MG PO TBEC
40.0000 mg | DELAYED_RELEASE_TABLET | Freq: Two times a day (BID) | ORAL | Status: DC
  Administered 2015-10-30: 40 mg via ORAL
  Filled 2015-10-30 (×5): qty 1

## 2015-10-30 MED ORDER — ONDANSETRON 4 MG PO TBDP: 4.0000 mg | ORAL_TABLET | Freq: Four times a day (QID) | ORAL | Status: DC | PRN

## 2015-10-30 MED ORDER — LORAZEPAM 1 MG PO TABS: 1.0000 mg | ORAL_TABLET | Freq: Four times a day (QID) | ORAL | Status: DC

## 2015-10-30 MED ORDER — LORAZEPAM 1 MG PO TABS: 1.0000 mg | ORAL_TABLET | Freq: Two times a day (BID) | ORAL | Status: DC

## 2015-10-30 MED ORDER — GABAPENTIN 100 MG PO CAPS
200.0000 mg | ORAL_CAPSULE | Freq: Two times a day (BID) | ORAL | Status: DC
  Administered 2015-10-30: 200 mg via ORAL
  Filled 2015-10-30 (×5): qty 2

## 2015-10-30 MED ORDER — THIAMINE HCL 100 MG/ML IJ SOLN
100.0000 mg | Freq: Once | INTRAMUSCULAR | Status: AC
  Filled 2015-10-30: qty 2

## 2015-10-30 MED ORDER — HYDROXYZINE HCL 25 MG PO TABS: 25.0000 mg | ORAL_TABLET | Freq: Four times a day (QID) | ORAL | Status: DC | PRN

## 2015-10-30 MED ORDER — QUETIAPINE FUMARATE 50 MG PO TABS
50.0000 mg | ORAL_TABLET | Freq: Every day | ORAL | Status: DC
  Administered 2015-10-30: 50 mg via ORAL
  Filled 2015-10-30 (×2): qty 1

## 2015-10-30 MED ORDER — VITAMIN B-1 100 MG PO TABS
100.0000 mg | ORAL_TABLET | Freq: Every day | ORAL | Status: DC
  Filled 2015-10-30 (×2): qty 1

## 2015-10-30 MED ORDER — HYDROXYZINE HCL 25 MG PO TABS
25.0000 mg | ORAL_TABLET | Freq: Four times a day (QID) | ORAL | Status: DC | PRN
  Filled 2015-10-30: qty 1

## 2015-10-30 MED ORDER — AMLODIPINE BESYLATE 5 MG PO TABS
5.0000 mg | ORAL_TABLET | Freq: Every day | ORAL | Status: DC
  Administered 2015-10-30: 5 mg via ORAL
  Filled 2015-10-30 (×3): qty 1

## 2015-10-30 NOTE — Tx Team (Signed)
Initial Interdisciplinary Treatment Plan   PATIENT STRESSORS: Financial difficulties Medication change or noncompliance Substance abuse   PATIENT STRENGTHS: Communication skills Physical Health Supportive family/friends Work skills   PROBLEM LIST: Problem List/Patient Goals Date to be addressed Date deferred Reason deferred Estimated date of resolution  Depression 10/30/15     SI w/plan 10/30/15     Alcohol abuse 10/30/15     Psychosis 10/30/15                                    DISCHARGE CRITERIA:  Motivation to continue treatment in a less acute level of care Reduction of life-threatening or endangering symptoms to within safe limits Verbal commitment to aftercare and medication compliance Withdrawal symptoms are absent or subacute and managed without 24-hour nursing intervention  PRELIMINARY DISCHARGE PLAN: Attend 12-step recovery group  PATIENT/FAMIILY INVOLVEMENT: This treatment plan has been presented to and reviewed with the patient, CURBY CARSWELL.  The patient and family have been given the opportunity to ask questions and make suggestions.  Zipporah Plants 10/30/2015, 1:18 PM

## 2015-10-30 NOTE — ED Notes (Signed)
Bed: HW38 Expected date:  Expected time:  Means of arrival:  Comments: Blood in stool from [facility]
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
## 2015-10-30 NOTE — [facility] Group Notes (Signed)
[place] LCSW Group Therapy 10/30/2015  1:15 PM   Type of Therapy: Group Therapy  Participation Level: Did Not Attend. Patient invited to participate but declined.   Tilden Fossa, MSW, [place] Worker [facility] [phone]

## 2015-10-30 NOTE — BHH Suicide Risk Assessment (Addendum)
[facility] Admission Suicide Risk Assessment   Nursing information obtained from:   chart, patient Demographic factors:    50 year old divorced male, Veteran Current Mental Status:   see below  Loss Factors:   divorced, unreliable employment , limited support system, legal issues  Historical Factors:   alcohol dependence, depression Risk Reduction Factors:   resilience  Total Time spent with patient: 45 minutes  Principal Problem:  Major Depression, with psychotic features, Alcohol Dependence Diagnosis:   Patient Active Problem List   Diagnosis Date Noted  . Bipolar 1 disorder ([place]) [F31.9] 10/30/2015  . GI bleed [K92.2] 07/26/2014  . Essential hypertension, benign [I10] 07/26/2014  . Abdominal pain [R10.9] 07/26/2014  . Chest discomfort [R07.89] 07/26/2014  . Rectal bleeding [K62.5] 07/26/2014  . Major depressive disorder, recurrent episode, severe, specified as with psychotic behavior [F33.3] 08/29/2013  . Alcohol dependence ([place]) [F10.20] 08/29/2013     Continued Clinical Symptoms:  Alcohol Use Disorder Identification Test Final Score (AUDIT): 31 The "Alcohol Use Disorders Identification Test", Guidelines for Use in Primary Care, Second Edition.  World Pharmacologist [facility]). Score between 0-7:  no or low risk or alcohol related problems. Score between 8-15:  moderate risk of alcohol related problems. Score between 16-19:  high risk of alcohol related problems. Score 20 or above:  warrants further diagnostic evaluation for alcohol dependence and treatment.   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 He describes history of depression, and states he has been feeling more depressed recently, with suicidal thoughts to include ideations of jumping off a bridge. Describes stressors to include no stable source of income, legal issues, being separated. Describes psychotic symptoms- states that when he feels depressed or stressed he experiences  there is someone else inside of him talking to and about him. At this time does not appear internally preoccupied and does not endorse command hallucinations. He was brought to ED by [employer]/  admitted under commitment . He endorses heavy , daily drinking, up to 6  40 oz beers per day, and admission BAL was 289. Denies other drug use. Of note, denies any history of seizures . Prior psychiatric admission in 2014 for depression and alcohol abuse. At this time does not endorse any clear history of mania, and does not present with symptoms of hypomania or mania. States that in the past he had been on Zoloft, but did not feel it was helping . Describes  Chronic migraines, and states that when depressed they tend to get worse, which is another stressor Dx- Major Depression, Recurrent, with psychotic features , Alcohol Dependence Plan- Inpatient admission- Alcohol Detox protocol with Ativan, as per CIWA protocol. Agrees to [facility] trial. Will start Seroquel initially at 50 mgrs QHS.  * of note, patient complains of some dysuria, and worries about STD . Will order GC/Chl, RPR, HIV, UA    Musculoskeletal: Strength & Muscle Tone: within normal limits- no tremors, no diaphoresis, no restlessness  Gait & Station: normal Patient leans: N/A  Psychiatric Specialty Exam: Physical Exam  ROS  Blood pressure 133/81, pulse 113, temperature 98.6 F (37 C), temperature source Oral, resp. rate 18, height 5' 10"  (1.778 m), weight 208 lb (94.348 kg).Body mass index is 29.84 kg/(m^2).  General Appearance: Fairly Groomed  Engineer, water::  Good  Speech:  Normal Rate  Volume:  Normal  Mood:  depressed   Affect:  Constricted  Thought Process:  Linear  Orientation:  Full (Time, Place, and Person)  Thought Content:  endorses auditory hallucinations, no delusions expressed   Suicidal Thoughts:  Yes.  without intent/plan- at this time denies plan or intention of hurting self or of SI  Homicidal Thoughts:  No  Memory:   recent and remote grossly intact   Judgement:  Fair  Insight:  Fair  Psychomotor Activity:  Normal- no current symptoms of withdrawal noted   Concentration:  Good  Recall:  Good  Fund of Knowledge:Good  Language: Good  Akathisia:  Negative  Handed:  Right  AIMS (if indicated):     Assets:  Communication Skills Desire for Improvement Resilience  Sleep:     Cognition: WNL  ADL's:  Intact     COGNITIVE FEATURES THAT CONTRIBUTE TO RISK:  Closed-mindedness and Loss of executive function    SUICIDE RISK:   Moderate:  Frequent suicidal ideation with limited intensity, and duration, some specificity in terms of plans, no associated intent, good self-control, limited dysphoria/symptomatology, some risk factors present, and identifiable protective factors, including available and accessible social support.  PLAN OF CARE: Patient will be admitted to inpatient psychiatric unit for stabilization and safety. Will provide and encourage milieu participation. Provide medication management and maked adjustments as needed.  Will also add medication to minimize risk of alcohol withdrawal .Will follow daily.    Medical Decision Making:  Review of Psycho-Social Stressors (1), Review or order clinical lab tests (1), Established Problem, Worsening (2) and Review of New Medication or Change in Dosage (2)  I certify that inpatient services furnished can reasonably be expected to improve the patient's condition.   Hanh Kertesz, [place] 10/30/2015, 4:42 PM

## 2015-10-30 NOTE — Progress Notes (Signed)
Patient ID: ATHENS LEBEAU, male   DOB: [birth year], 50 y.o.   MRN: [id number] Pt reported having blood in stool. Blood witnessed by another nurse on hallway. Pt reported this is third bloody stool this evening. Pt reported he did bot tell any daytime staff about this. Pt stated this has been going on for quite a while and sometime bleeding episode lasts for a month. VS Lying 128/70 P93, Sit 121/82 P107, Standing 105/70 P 126. PA notified new orders to send pt to ED. Charged and AC notified. Pt escorted to ED by EMS.

## 2015-10-30 NOTE — ED Notes (Addendum)
Per EMS, patient coming from [facility] & staff states he was orthostatic Sitting 142/93 Standing 123/83 Also, reports blood in stool x5 days and nausea.

## 2015-10-30 NOTE — Progress Notes (Addendum)
Pt appears very blunted and flat. Pt c/o a ,igraine headache. EDP evaluated the pt. Pt stated he would like to take a shower. Spoke with charge nurse as [facility] unable to take the patient before 12;30pm. Report was given to [facility], RN-Pt stated his headache  feels better and he did take a shower. He does contract for safety.Per charge ,Emeline [facility] at [facility]  stated pt can be sent by [facility] at 10am. [facility] was phoned at 9:20am. - Per [facility] the driver will be here by 10:30am. Phoned Patrice back to notiify  her.(

## 2015-10-30 NOTE — BH Assessment (Signed)
[place] Assessment Progress Note   Pt accepted to [facility] 400-2, Dr. Parke Poisson accepting.  Patient to be transported (on IVC) by [employer] after 08:00.  Nurse will need to arrange transportation with [employer].

## 2015-10-30 NOTE — ED Provider Notes (Signed)
Complains of frontal and left temporal headache gradual onset typical migraines he's had in the past. Ibuprofen has been previously ordered. Patient is alert Glasgow Coma Score 15 appears comfortable moves all extremities well.  Orlie Dakin, MD 10/30/15 [phone]
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
## 2015-10-30 NOTE — BH Assessment (Addendum)
Patient accepted to [facility] by Patriciaann Clan, PA. The attending provider is Dr. Parke Poisson . Patient assigned to room 400-2. Nursing report [phone].  Patient is voluntary and [facility]  will provide transport. Patient's nurse will need to make transport arrangements with [place].
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
## 2015-10-31 ENCOUNTER — Encounter (HOSPITAL_COMMUNITY): Payer: Self-pay

## 2015-10-31 ENCOUNTER — Encounter (HOSPITAL_COMMUNITY): Payer: Self-pay | Admitting: Internal Medicine

## 2015-10-31 ENCOUNTER — Inpatient Hospital Stay (HOSPITAL_COMMUNITY): Payer: Federal, State, Local not specified - Other

## 2015-10-31 ENCOUNTER — Observation Stay (HOSPITAL_COMMUNITY)
Admission: EM | Admit: 2015-10-31 | Discharge: 2015-11-02 | Disposition: A | Discharge status: Other Acute Inpatient Hospital | Payer: Non-veteran care | Attending: Internal Medicine | Admitting: Internal Medicine

## 2015-10-31 DIAGNOSIS — Z79899 Other long term (current) drug therapy: Secondary | ICD-10-CM | POA: Diagnosis not present

## 2015-10-31 DIAGNOSIS — R1030 Lower abdominal pain, unspecified: Secondary | ICD-10-CM | POA: Diagnosis not present

## 2015-10-31 DIAGNOSIS — Z800 Family history of malignant neoplasm of digestive organs: Secondary | ICD-10-CM | POA: Diagnosis not present

## 2015-10-31 DIAGNOSIS — G621 Alcoholic polyneuropathy: Secondary | ICD-10-CM | POA: Insufficient documentation

## 2015-10-31 DIAGNOSIS — Z23 Encounter for immunization: Secondary | ICD-10-CM | POA: Insufficient documentation

## 2015-10-31 DIAGNOSIS — F1020 Alcohol dependence, uncomplicated: Secondary | ICD-10-CM | POA: Diagnosis present

## 2015-10-31 DIAGNOSIS — F17210 Nicotine dependence, cigarettes, uncomplicated: Secondary | ICD-10-CM | POA: Diagnosis not present

## 2015-10-31 DIAGNOSIS — F333 Major depressive disorder, recurrent, severe with psychotic symptoms: Secondary | ICD-10-CM | POA: Diagnosis present

## 2015-10-31 DIAGNOSIS — R45851 Suicidal ideations: Secondary | ICD-10-CM

## 2015-10-31 DIAGNOSIS — K219 Gastro-esophageal reflux disease without esophagitis: Secondary | ICD-10-CM | POA: Insufficient documentation

## 2015-10-31 DIAGNOSIS — R1310 Dysphagia, unspecified: Secondary | ICD-10-CM | POA: Diagnosis not present

## 2015-10-31 DIAGNOSIS — F319 Bipolar disorder, unspecified: Secondary | ICD-10-CM | POA: Diagnosis not present

## 2015-10-31 DIAGNOSIS — F419 Anxiety disorder, unspecified: Secondary | ICD-10-CM | POA: Diagnosis not present

## 2015-10-31 DIAGNOSIS — G6289 Other specified polyneuropathies: Secondary | ICD-10-CM | POA: Diagnosis not present

## 2015-10-31 DIAGNOSIS — G622 Polyneuropathy due to other toxic agents: Secondary | ICD-10-CM | POA: Diagnosis present

## 2015-10-31 DIAGNOSIS — M1990 Unspecified osteoarthritis, unspecified site: Secondary | ICD-10-CM | POA: Diagnosis not present

## 2015-10-31 DIAGNOSIS — K648 Other hemorrhoids: Secondary | ICD-10-CM | POA: Insufficient documentation

## 2015-10-31 DIAGNOSIS — I10 Essential (primary) hypertension: Secondary | ICD-10-CM | POA: Insufficient documentation

## 2015-10-31 DIAGNOSIS — K922 Gastrointestinal hemorrhage, unspecified: Secondary | ICD-10-CM

## 2015-10-31 DIAGNOSIS — K920 Hematemesis: Secondary | ICD-10-CM | POA: Insufficient documentation

## 2015-10-31 DIAGNOSIS — K921 Melena: Secondary | ICD-10-CM | POA: Diagnosis not present

## 2015-10-31 DIAGNOSIS — R740 Nonspecific elevation of levels of transaminase and lactic acid dehydrogenase [LDH]: Secondary | ICD-10-CM | POA: Diagnosis not present

## 2015-10-31 DIAGNOSIS — Z8719 Personal history of other diseases of the digestive system: Secondary | ICD-10-CM | POA: Diagnosis not present

## 2015-10-31 DIAGNOSIS — K2950 Unspecified chronic gastritis without bleeding: Secondary | ICD-10-CM | POA: Insufficient documentation

## 2015-10-31 DIAGNOSIS — F323 Major depressive disorder, single episode, severe with psychotic features: Secondary | ICD-10-CM | POA: Diagnosis not present

## 2015-10-31 DIAGNOSIS — T510X1A Toxic effect of ethanol, accidental (unintentional), initial encounter: Secondary | ICD-10-CM | POA: Insufficient documentation

## 2015-10-31 DIAGNOSIS — D62 Acute posthemorrhagic anemia: Secondary | ICD-10-CM | POA: Diagnosis not present

## 2015-10-31 DIAGNOSIS — D61818 Other pancytopenia: Secondary | ICD-10-CM | POA: Diagnosis not present

## 2015-10-31 DIAGNOSIS — G4730 Sleep apnea, unspecified: Secondary | ICD-10-CM | POA: Insufficient documentation

## 2015-10-31 DIAGNOSIS — K51911 Ulcerative colitis, unspecified with rectal bleeding: Secondary | ICD-10-CM | POA: Insufficient documentation

## 2015-10-31 DIAGNOSIS — R7401 Elevation of levels of liver transaminase levels: Secondary | ICD-10-CM | POA: Diagnosis present

## 2015-10-31 HISTORY — DX: Hemorrhage of anus and rectum: K62.5

## 2015-10-31 LAB — URINALYSIS, ROUTINE W REFLEX MICROSCOPIC
Bilirubin Urine: NEGATIVE
GLUCOSE, UA: NEGATIVE mg/dL
Hgb urine dipstick: NEGATIVE
Ketones, ur: NEGATIVE mg/dL
LEUKOCYTES UA: NEGATIVE
Nitrite: NEGATIVE
PH: 6 (ref 5.0–8.0)
Protein, ur: NEGATIVE mg/dL
Specific Gravity, Urine: 1.012 (ref 1.005–1.030)

## 2015-10-31 LAB — CBC WITH DIFFERENTIAL/PLATELET
BASOS ABS: 0 10*3/uL (ref 0.0–0.1)
BASOS PCT: 0 %
EOS ABS: 0.1 10*3/uL (ref 0.0–0.7)
Eosinophils Relative: 3 %
HEMATOCRIT: 33.6 % — AB (ref 39.0–52.0)
Hemoglobin: 10.7 g/dL — ABNORMAL LOW (ref 13.0–17.0)
Lymphocytes Relative: 39 %
Lymphs Abs: 1.3 10*3/uL (ref 0.7–4.0)
MCH: 27.6 pg (ref 26.0–34.0)
MCHC: 31.8 g/dL (ref 30.0–36.0)
MCV: 86.8 fL (ref 78.0–100.0)
MONO ABS: 0.5 10*3/uL (ref 0.1–1.0)
Monocytes Relative: 14 %
NEUTROS ABS: 1.5 10*3/uL — AB (ref 1.7–7.7)
NEUTROS PCT: 44 %
Platelets: 134 10*3/uL — ABNORMAL LOW (ref 150–400)
RBC: 3.87 MIL/uL — ABNORMAL LOW (ref 4.22–5.81)
RDW: 18 % — AB (ref 11.5–15.5)
WBC: 3.4 10*3/uL — ABNORMAL LOW (ref 4.0–10.5)

## 2015-10-31 LAB — POC OCCULT BLOOD, ED: Fecal Occult Bld: POSITIVE — AB

## 2015-10-31 LAB — GC/CHLAMYDIA PROBE AMP (~~LOC~~) NOT AT ARMC
Chlamydia: NEGATIVE
Neisseria Gonorrhea: NEGATIVE

## 2015-10-31 MED ORDER — IOHEXOL 300 MG/ML  SOLN
25.0000 mL | Freq: Once | INTRAMUSCULAR | Status: AC | PRN
  Administered 2015-10-31: 25 mL via ORAL

## 2015-10-31 MED ORDER — FLUOXETINE HCL 20 MG PO CAPS
20.0000 mg | ORAL_CAPSULE | Freq: Every day | ORAL | Status: DC
  Administered 2015-10-31 – 2015-11-02 (×3): 20 mg via ORAL
  Filled 2015-10-31 (×3): qty 1

## 2015-10-31 MED ORDER — METOCLOPRAMIDE HCL 5 MG/ML IJ SOLN
10.0000 mg | Freq: Once | INTRAMUSCULAR | Status: AC
  Administered 2015-10-31: 10 mg via INTRAVENOUS
  Filled 2015-10-31: qty 2

## 2015-10-31 MED ORDER — ONDANSETRON HCL 4 MG/2ML IJ SOLN
4.0000 mg | Freq: Four times a day (QID) | INTRAMUSCULAR | Status: DC | PRN
  Administered 2015-11-01: 4 mg via INTRAVENOUS

## 2015-10-31 MED ORDER — VITAMIN D 1000 UNITS PO TABS
1000.0000 [IU] | ORAL_TABLET | Freq: Every day | ORAL | Status: DC
  Administered 2015-10-31 – 2015-11-01 (×2): 1000 [IU] via ORAL
  Filled 2015-10-31 (×2): qty 1

## 2015-10-31 MED ORDER — PEG-KCL-NACL-NASULF-NA ASC-C 100 G PO SOLR: 1.0000 | Freq: Once | ORAL | Status: DC

## 2015-10-31 MED ORDER — AMLODIPINE BESYLATE 10 MG PO TABS
10.0000 mg | ORAL_TABLET | Freq: Every day | ORAL | Status: DC
  Administered 2015-10-31 – 2015-11-01 (×2): 10 mg via ORAL
  Filled 2015-10-31 (×2): qty 1

## 2015-10-31 MED ORDER — PANTOPRAZOLE SODIUM 40 MG IV SOLR
40.0000 mg | Freq: Two times a day (BID) | INTRAVENOUS | Status: DC
  Administered 2015-10-31: 40 mg via INTRAVENOUS
  Filled 2015-10-31: qty 40

## 2015-10-31 MED ORDER — PEG-KCL-NACL-NASULF-NA ASC-C 100 G PO SOLR
0.5000 | Freq: Once | ORAL | Status: AC
  Administered 2015-11-01: 100 g via ORAL
  Filled 2015-10-31: qty 1

## 2015-10-31 MED ORDER — OXYCODONE HCL 5 MG PO TABS
5.0000 mg | ORAL_TABLET | ORAL | Status: DC | PRN
  Administered 2015-10-31 – 2015-11-02 (×5): 5 mg via ORAL
  Filled 2015-10-31 (×5): qty 1

## 2015-10-31 MED ORDER — SERTRALINE HCL 100 MG PO TABS: 100.0000 mg | ORAL_TABLET | Freq: Every day | ORAL | Status: DC

## 2015-10-31 MED ORDER — DIPHENHYDRAMINE HCL 50 MG/ML IJ SOLN
12.5000 mg | Freq: Once | INTRAMUSCULAR | Status: AC
  Administered 2015-10-31: 12.5 mg via INTRAVENOUS
  Filled 2015-10-31: qty 1

## 2015-10-31 MED ORDER — PANTOPRAZOLE SODIUM 40 MG PO TBEC: 40.0000 mg | DELAYED_RELEASE_TABLET | Freq: Two times a day (BID) | ORAL | Status: DC

## 2015-10-31 MED ORDER — PEG-KCL-NACL-NASULF-NA ASC-C 100 G PO SOLR
0.5000 | Freq: Once | ORAL | Status: AC
  Administered 2015-10-31: 100 g via ORAL
  Filled 2015-10-31: qty 1

## 2015-10-31 MED ORDER — GABAPENTIN 100 MG PO CAPS
200.0000 mg | ORAL_CAPSULE | Freq: Two times a day (BID) | ORAL | Status: DC
  Administered 2015-10-31 – 2015-11-02 (×5): 200 mg via ORAL
  Filled 2015-10-31 (×5): qty 2

## 2015-10-31 MED ORDER — ONDANSETRON HCL 4 MG PO TABS: 4.0000 mg | ORAL_TABLET | Freq: Four times a day (QID) | ORAL | Status: DC | PRN

## 2015-10-31 MED ORDER — TRAZODONE HCL 50 MG PO TABS
50.0000 mg | ORAL_TABLET | Freq: Every evening | ORAL | Status: DC | PRN
  Administered 2015-10-31 – 2015-11-01 (×2): 50 mg via ORAL
  Filled 2015-10-31 (×2): qty 1

## 2015-10-31 MED ORDER — HALOPERIDOL 5 MG PO TABS
5.0000 mg | ORAL_TABLET | Freq: Every day | ORAL | Status: DC
  Administered 2015-10-31 – 2015-11-01 (×2): 5 mg via ORAL
  Filled 2015-10-31 (×3): qty 1

## 2015-10-31 MED ORDER — TRAZODONE HCL 50 MG PO TABS: 100.0000 mg | ORAL_TABLET | Freq: Every evening | ORAL | Status: DC | PRN

## 2015-10-31 MED ORDER — ATORVASTATIN CALCIUM 40 MG PO TABS
40.0000 mg | ORAL_TABLET | Freq: Every day | ORAL | Status: DC
  Administered 2015-10-31 – 2015-11-02 (×3): 40 mg via ORAL
  Filled 2015-10-31 (×3): qty 1

## 2015-10-31 MED ORDER — IOHEXOL 300 MG/ML  SOLN
100.0000 mL | Freq: Once | INTRAMUSCULAR | Status: AC | PRN
  Administered 2015-10-31: 100 mL via INTRAVENOUS

## 2015-10-31 MED ORDER — PANTOPRAZOLE SODIUM 40 MG IV SOLR
40.0000 mg | INTRAVENOUS | Status: DC
Start: 2015-10-31 — End: 2015-10-31
  Administered 2015-10-31: 40 mg via INTRAVENOUS
  Filled 2015-10-31: qty 40

## 2015-10-31 MED ORDER — INFLUENZA VAC SPLIT QUAD 0.5 ML IM SUSY
0.5000 mL | PREFILLED_SYRINGE | INTRAMUSCULAR | Status: AC
  Administered 2015-11-01: 0.5 mL via INTRAMUSCULAR
  Filled 2015-10-31 (×2): qty 0.5
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
## 2015-10-31 NOTE — ED Notes (Signed)
Patient given sandwich and water. Nehemiah Settle, [place] stated ok for patient to have food.

## 2015-10-31 NOTE — ED Notes (Addendum)
Jeans and two earrings (placed in separate bag with label). Placed in belonging bag. Security cleared. Pt states other belongings at [facility]. Placed in [place] [id number]
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
## 2015-10-31 NOTE — Progress Notes (Signed)
Patient arrived to unit at around 1135 with a sitter for suicidal ideation per ED nurse. Alert and oriented x4. Patient c/o migraine headache, Oxycodone given per order. Admission orders discussed with patient,cooperative at this time.will continue to monitor.
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
## 2015-10-31 NOTE — ED Notes (Signed)
Patient transported to CT 

## 2015-10-31 NOTE — ED Notes (Signed)
Pt transferred from [facility] for a rectal bleed and is being admitted

## 2015-10-31 NOTE — ED Notes (Signed)
Nurse drawing labs. 

## 2015-10-31 NOTE — ED Notes (Signed)
Bed: WL70 Expected date:  Expected time:  Means of arrival:  Comments: rm 23

## 2015-10-31 NOTE — ED Notes (Signed)
PA at bedside.

## 2015-10-31 NOTE — ED Notes (Signed)
CT notified that contrast has been drank. CT tech states they are coming to get this patient soon.

## 2015-11-01 ENCOUNTER — Observation Stay (HOSPITAL_COMMUNITY): Payer: Non-veteran care | Admitting: Anesthesiology

## 2015-11-01 ENCOUNTER — Encounter (HOSPITAL_COMMUNITY)
Admission: EM | Disposition: A | Discharge status: Other Acute Inpatient Hospital | Payer: Self-pay | Source: Home / Self Care | Attending: Internal Medicine

## 2015-11-01 ENCOUNTER — Encounter (HOSPITAL_COMMUNITY): Payer: Self-pay | Admitting: Anesthesiology

## 2015-11-01 DIAGNOSIS — F319 Bipolar disorder, unspecified: Secondary | ICD-10-CM

## 2015-11-01 DIAGNOSIS — K648 Other hemorrhoids: Secondary | ICD-10-CM | POA: Diagnosis not present

## 2015-11-01 DIAGNOSIS — R1310 Dysphagia, unspecified: Secondary | ICD-10-CM | POA: Diagnosis not present

## 2015-11-01 DIAGNOSIS — K921 Melena: Secondary | ICD-10-CM | POA: Diagnosis not present

## 2015-11-01 DIAGNOSIS — R740 Nonspecific elevation of levels of transaminase and lactic acid dehydrogenase [LDH]: Secondary | ICD-10-CM | POA: Diagnosis not present

## 2015-11-01 DIAGNOSIS — F333 Major depressive disorder, recurrent, severe with psychotic symptoms: Secondary | ICD-10-CM

## 2015-11-01 DIAGNOSIS — K922 Gastrointestinal hemorrhage, unspecified: Secondary | ICD-10-CM

## 2015-11-01 DIAGNOSIS — K920 Hematemesis: Secondary | ICD-10-CM | POA: Diagnosis not present

## 2015-11-01 DIAGNOSIS — F1020 Alcohol dependence, uncomplicated: Secondary | ICD-10-CM | POA: Diagnosis not present

## 2015-11-01 DIAGNOSIS — K2950 Unspecified chronic gastritis without bleeding: Secondary | ICD-10-CM | POA: Diagnosis not present

## 2015-11-01 HISTORY — PX: COLONOSCOPY WITH PROPOFOL: SHX5780

## 2015-11-01 HISTORY — PX: ESOPHAGOGASTRODUODENOSCOPY (EGD) WITH PROPOFOL: SHX5813

## 2015-11-01 LAB — CBC
HCT: 35.5 % — ABNORMAL LOW (ref 39.0–52.0)
Hemoglobin: 11.2 g/dL — ABNORMAL LOW (ref 13.0–17.0)
MCH: 28.3 pg (ref 26.0–34.0)
MCHC: 31.5 g/dL (ref 30.0–36.0)
MCV: 89.6 fL (ref 78.0–100.0)
Platelets: 161 10*3/uL (ref 150–400)
RBC: 3.96 MIL/uL — ABNORMAL LOW (ref 4.22–5.81)
RDW: 18.1 % — AB (ref 11.5–15.5)
WBC: 3.3 10*3/uL — AB (ref 4.0–10.5)

## 2015-11-01 LAB — BASIC METABOLIC PANEL
Anion gap: 8 (ref 5–15)
CO2: 26 mmol/L (ref 22–32)
Calcium: 8.7 mg/dL — ABNORMAL LOW (ref 8.9–10.3)
Chloride: 107 mmol/L (ref 101–111)
Creatinine, Ser: 0.77 mg/dL (ref 0.61–1.24)
GFR calc Af Amer: >60 mL/min (ref >60)
Glucose, Bld: 104 mg/dL — ABNORMAL HIGH (ref 65–99)
POTASSIUM: 3.8 mmol/L (ref 3.5–5.1)
SODIUM: 141 mmol/L (ref 135–145)

## 2015-11-01 SURGERY — COLONOSCOPY WITH PROPOFOL
Anesthesia: Monitor Anesthesia Care

## 2015-11-01 MED ORDER — LORAZEPAM 1 MG PO TABS: 1.0000 mg | ORAL_TABLET | Freq: Four times a day (QID) | ORAL | Status: DC | PRN

## 2015-11-01 MED ORDER — THIAMINE HCL 100 MG/ML IJ SOLN: 100.0000 mg | Freq: Every day | INTRAMUSCULAR | Status: DC

## 2015-11-01 MED ORDER — PROPOFOL 10 MG/ML IV BOLUS
INTRAVENOUS | Status: AC
  Filled 2015-11-01: qty 20

## 2015-11-01 MED ORDER — SERTRALINE HCL 100 MG PO TABS: 100.0000 mg | ORAL_TABLET | Freq: Every day | ORAL | Status: DC

## 2015-11-01 MED ORDER — VITAMIN B-1 100 MG PO TABS
100.0000 mg | ORAL_TABLET | Freq: Every day | ORAL | Status: DC
Start: 2015-11-01 — End: 2015-11-02
  Administered 2015-11-01 – 2015-11-02 (×2): 100 mg via ORAL
  Filled 2015-11-01 (×2): qty 1

## 2015-11-01 MED ORDER — ADULT MULTIVITAMIN W/MINERALS CH
1.0000 | ORAL_TABLET | Freq: Every day | ORAL | Status: DC
Start: 2015-11-01 — End: 2015-11-02
  Administered 2015-11-01 – 2015-11-02 (×2): 1 via ORAL
  Filled 2015-11-01 (×2): qty 1

## 2015-11-01 MED ORDER — PROPOFOL 500 MG/50ML IV EMUL
INTRAVENOUS | Status: DC | PRN
  Administered 2015-11-01: 30 mg via INTRAVENOUS

## 2015-11-01 MED ORDER — FOLIC ACID 1 MG PO TABS
1.0000 mg | ORAL_TABLET | Freq: Every day | ORAL | Status: DC
  Administered 2015-11-01 – 2015-11-02 (×2): 1 mg via ORAL
  Filled 2015-11-01 (×2): qty 1

## 2015-11-01 MED ORDER — LORAZEPAM 2 MG/ML IJ SOLN: 1.0000 mg | Freq: Four times a day (QID) | INTRAMUSCULAR | Status: DC | PRN

## 2015-11-01 MED ORDER — GLYCOPYRROLATE 0.2 MG/ML IJ SOLN
INTRAMUSCULAR | Status: DC | PRN
  Administered 2015-11-01: 0.2 mg via INTRAVENOUS

## 2015-11-01 MED ORDER — PROPOFOL 500 MG/50ML IV EMUL
INTRAVENOUS | Status: DC | PRN
  Administered 2015-11-01: 300 ug/kg/min via INTRAVENOUS

## 2015-11-01 MED ORDER — SODIUM CHLORIDE 0.9 % IV SOLN: INTRAVENOUS | Status: DC

## 2015-11-01 MED ORDER — PANTOPRAZOLE SODIUM 40 MG PO TBEC
40.0000 mg | DELAYED_RELEASE_TABLET | Freq: Every day | ORAL | Status: DC
  Administered 2015-11-02: 40 mg via ORAL
  Filled 2015-11-01: qty 1

## 2015-11-01 MED ORDER — HYDROCORTISONE ACETATE 25 MG RE SUPP
25.0000 mg | Freq: Two times a day (BID) | RECTAL | Status: DC
  Administered 2015-11-01 – 2015-11-02 (×2): 25 mg via RECTAL
  Filled 2015-11-01 (×3): qty 1

## 2015-11-01 MED ORDER — PROPOFOL 10 MG/ML IV BOLUS
INTRAVENOUS | Status: AC
  Filled 2015-11-01: qty 60

## 2015-11-01 MED ORDER — MIDAZOLAM HCL 2 MG/2ML IJ SOLN
INTRAMUSCULAR | Status: AC
  Filled 2015-11-01: qty 2

## 2015-11-01 MED ORDER — LACTATED RINGERS IV SOLN
INTRAVENOUS | Status: DC
  Administered 2015-11-01: 500 mL via INTRAVENOUS
  Administered 2015-11-01: 13:00:00 via INTRAVENOUS

## 2015-11-01 MED ORDER — MIDAZOLAM HCL 5 MG/5ML IJ SOLN
INTRAMUSCULAR | Status: DC | PRN
  Administered 2015-11-01: 2 mg via INTRAVENOUS

## 2015-11-01 SURGICAL SUPPLY — 25 items
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
## 2015-11-01 NOTE — Progress Notes (Signed)
[facility] PROGRESS NOTE  CHURCHILL GERE WUJ:[id number] DOB: August 20, 1966 DOA: 10/31/2015  PCP: PROVIDER NOT IN SYSTEM  Brief HPI: 50 year old African-American male with a past medical history of hypertension, pancreatitis, bipolar disorder, depression, alcohol abuse, presented to the emergency department on January 9, with homicidal and suicidal ideation. He was under evaluation by behavioral health. During his stay in the emergency department he was noted to have a bloody bowel movement. This persisted. Subsequently, he was hospitalized for further management of same.  Past medical history:  Past Medical History  Diagnosis Date  . Acid reflux   . Hypertension   . Pancreatitis   . Mental disorder   . Depression   . Sleep apnea     "haven't been RX'd mask yet" (07/26/2014)  . History of blood transfusion ~ 2000    "related to nose bleeding"  . History of stomach ulcers   . NFAOZHYQ(657.8)     "weekly" (07/26/2014)  . Migraine     "@ least monthly" (07/26/2014)  . Arthritis     "toes" (07/26/2014)  . Anxiety   . Bipolar disorder (HCC)   . Lower GI bleeding admitted 07/26/2014  . Rectal bleeding 07/26/2014    Consultants: Gastroenterology  Procedures: EGD and colonoscopy scheduled for today.  Antibiotics: None  Subjective: Patient feels well this morning. Denies any abdominal pain. Some nausea but no vomiting. No further bloody stools. Currently denies any suicidal or homicidal thoughts.  Objective: Vital Signs  Filed Vitals:   10/31/15 1425 10/31/15 2200 11/01/15 0535 11/01/15 1153  BP: 138/86 143/97 127/83 144/90  Pulse: 79 74 63 64  Temp: 97.7 F (36.5 C) 98.5 F (36.9 C) 97.5 F (36.4 C) 98.2 F (36.8 C)  TempSrc: Oral Oral Oral Oral  Resp: 16 18 18 9   Height:    5\' 10"  (1.778 m)  Weight:    88.905 kg (196 lb)  SpO2: 99% 99% 100% 100%    Intake/Output Summary (Last 24 hours) at 11/01/15 1218 Last data filed at 10/31/15 2209  Gross per 24 hour  Intake    2060 ml  Output      0 ml  Net   2060 ml   Filed Weights   10/31/15 0724 11/01/15 1153  Weight: 94.348 kg (208 lb) 88.905 kg (196 lb)    General appearance: alert, cooperative, appears stated age and no distress Resp: clear to auscultation bilaterally Cardio: regular rate and rhythm, S1, S2 normal, no murmur, click, rub or gallop GI: soft, non-tender; bowel sounds normal; no masses,  no organomegaly Extremities: extremities normal, atraumatic, no cyanosis or edema Pulses: 2+ and symmetric Neurologic: Alert and oriented 3. Cranial nerves II-12 intact. Motor strength equal bilateral upper and lower extremities  Lab Results:  Basic Metabolic Panel:  Recent Labs Lab 10/29/15 2140 11/01/15 0410  NA 136 141  K 3.6 3.8  CL 101 107  CO2 23 26  GLUCOSE 105* 104*  BUN 7 <5*  CREATININE 0.82 0.77  CALCIUM 9.1 8.7*   Liver Function Tests:  Recent Labs Lab 10/29/15 2140  AST 53*  ALT 27  ALKPHOS 62  BILITOT 0.7  PROT 8.3*  ALBUMIN 4.4   CBC:  Recent Labs Lab 10/29/15 2140 10/31/15 0537 11/01/15 0410  WBC 4.6 3.4* 3.3*  NEUTROABS  --  1.5*  --   HGB 12.9* 10.7* 11.2*  HCT 38.9* 33.6* 35.5*  MCV 85.9 86.8 89.6  PLT 176 134* 161   CBG:  Recent Labs Lab 10/30/15 2135  GLUCAP 157*    No results found for this or any previous visit (from the past 240 hour(s)).    Studies/Results: Ct Abdomen Pelvis W Contrast  10/31/2015  CLINICAL DATA:  50 year old male with abdominal pain and blood in stool. EXAM: CT ABDOMEN AND PELVIS WITH CONTRAST TECHNIQUE: Multidetector CT imaging of the abdomen and pelvis was performed using the standard protocol following bolus administration of intravenous contrast. CONTRAST:  OMNIPAQUE IOHEXOL 300 MG/ML  SOLN COMPARISON:  CT dated 11/18/2009 FINDINGS: Minimal bibasilar dependent atelectatic changes. The visualized lung bases are otherwise clear. No intra-abdominal free air or free fluid. The liver, gallbladder, pancreas, spleen,  adrenal glands appear unremarkable. A 1 cm stable appearing right renal exophytic hypodense lesion, most likely a cyst. There is no hydronephrosis on either side. The visualized ureters and urinary bladder appear unremarkable. The prostate and seminal vesicles are grossly unremarkable. Moderate stool throughout the colon. No evidence of bowel obstruction or inflammation. Normal appendix. The abdominal aorta and IVC appear patent. No portal venous gas identified. There is no adenopathy. Small fat containing umbilical hernia. The osseous structures are intact. IMPRESSION: No acute intra-abdominal or pelvic pathology. Electronically Signed   By: Elgie Collard M.D.   On: 10/31/2015 04:05    Medications:  Scheduled: . [MAR Hold] amLODipine  10 mg Oral QHS  . [MAR Hold] atorvastatin  40 mg Oral Daily  . [MAR Hold] cholecalciferol  1,000 Units Oral QHS  . [MAR Hold] FLUoxetine  20 mg Oral Daily  . [MAR Hold] gabapentin  200 mg Oral BID  . [MAR Hold] haloperidol  5 mg Oral QHS  . Influenza vac split quadrivalent PF  0.5 mL Intramuscular Tomorrow-1000  . [MAR Hold] pantoprazole (PROTONIX) IV  40 mg Intravenous Q12H   Continuous: . sodium chloride    . 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 Patient does have a significant history of alcohol abuse. It is also some history of hemorrhoids. Patient has been seen by gastroenterology. Plan is  for upper endoscopy and colonoscopy today. He remains on PPI twice a day. He hasn't had any further recurrence of bleeding. Hemoglobin has been stable.  Alcohol dependence/abuse Stable. No signs or symptoms of withdrawal. He needs to be on thiamine and multivitamins. His last alcoholic drink was more than 3 days ago.   Pancytopenia Most likely due to marrow suppression by alcohol. Monitor for now.  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 He is on fluoxetine and Haldol.  Transaminitis Alcohol induced. Stable.  Suicidal ideations Corporate investment banker. Consult psychiatry to follow the patient while he is hospitalized here. He may need to go to behavioral health when medically cleared for discharge.  DVT Prophylaxis: SCDs    Code Status: Full code  Family Communication: Discussed with the patient.  Disposition Plan: Await endoscopies.     LOS: 1 day  [facility]  [facility] Pager [phone] 11/01/2015, 12:18 PM  If 7PM-7AM, please contact night-coverage at [email or web address], password [facility]

## 2015-11-01 NOTE — Progress Notes (Signed)
For EGD and colonoscopy today for evaluation of GI bleeding (hematemesis and hematochezia).  Drank all of his prep and did well.  Stools watery and yellowish to clear.

## 2015-11-01 NOTE — Progress Notes (Signed)
Patient ID: Jacob Adams, male   DOB: 1966-01-13, 50 y.o.   MRN: [id number]  Psychiatric consultation received, chart reviewed and case discussed with the staff RN who reported patient has been at gastroenterology suite. Patient will be assessed tomorrow.  Jacob Adams,JANARDHAHA R. 11/01/2015 3:37 PM

## 2015-11-01 NOTE — Op Note (Signed)
[facility] [place], [address]   ENDOSCOPY PROCEDURE REPORT  PATIENT: Jacob Adams, Jacob Adams  MR#: [id number] BIRTHDATE: Sep 06, 1966 , 49  yrs. old GENDER: male ENDOSCOPIST: Jerene Bears, MD REFERRED BY:  [facility] PROCEDURE DATE:  11/01/2015 PROCEDURE:  EGD, diagnostic ASA CLASS:     Class III INDICATIONS:  hematemesis. MEDICATIONS: Monitored anesthesia care and Per Anesthesia TOPICAL ANESTHETIC: Cetacaine Spray  DESCRIPTION OF PROCEDURE: After the risks benefits and alternatives of the procedure were thoroughly explained, informed consent was obtained.  The [id number] ([id number]) endoscope was introduced through the mouth and advanced to the second portion of the duodenum , Without limitations.  The instrument was slowly withdrawn as the mucosa was fully examined.   ESOPHAGUS: The mucosa of the esophagus appeared normal.  STOMACH: Mild gastritis (inflammation) was found at the incisura. Cold forcep biopsies were taken at the gastric body, antrum and angularis to evaluate for h.  pylori.   Stomach otherwise unremarkable.  DUODENUM: The duodenal mucosa showed no abnormalities in the bulb and 2nd part of the duodenum.  Retroflexed views revealed no abnormalities.     The scope was then withdrawn from the patient and the procedure completed.  COMPLICATIONS: There were no immediate complications.  ENDOSCOPIC IMPRESSION: 1.   The mucosa of the esophagus appeared normal 2.   Gastritis (inflammation) was found at the incisura; multiple biopsies 3.   The duodenal mucosa showed no abnormalities in the bulb and 2nd part of the duodenum  RECOMMENDATIONS: 1.  Await biopsy results 2.  Proceed with a Colonoscopy.  eSigned:  Jerene Bears, MD 11/01/2015 1:45 PM    CC: the patient

## 2015-11-01 NOTE — Anesthesia Preprocedure Evaluation (Addendum)
Anesthesia Evaluation  Patient identified by MRN, date of birth, ID band Patient awake    Reviewed: Allergy & Precautions, NPO status , Patient's Chart, lab work & pertinent test results  Airway Mallampati: II  TM Distance: >3 FB Neck ROM: Full    Dental  (+) Partial Upper, Dental Advisory Given   Pulmonary sleep apnea , Current Smoker,    breath sounds clear to auscultation       Cardiovascular hypertension, Pt. on medications  Rhythm:Regular Rate:Normal     Neuro/Psych  Headaches, PSYCHIATRIC DISORDERS Anxiety Depression Bipolar Disorder  Neuromuscular disease    GI/Hepatic Neg liver ROS, GERD  Medicated,  Endo/Other  negative endocrine ROS  Renal/GU negative Renal ROS  negative genitourinary   Musculoskeletal  (+) Arthritis , Osteoarthritis,    Abdominal   Peds negative pediatric ROS (+)  Hematology   Anesthesia Other Findings   Reproductive/Obstetrics negative OB ROS                            Lab Results  Component Value Date   WBC 3.3* 11/01/2015   HGB 11.2* 11/01/2015   HCT 35.5* 11/01/2015   MCV 89.6 11/01/2015   PLT 161 11/01/2015   Lab Results  Component Value Date   CREATININE 0.77 11/01/2015   BUN <5* 11/01/2015   NA 141 11/01/2015   K 3.8 11/01/2015   CL 107 11/01/2015   CO2 26 11/01/2015   Lab Results  Component Value Date   INR 1.08 07/27/2014   INR 0.91 11/18/2009   05/2015 EKG: normal sinus rhythm.   Anesthesia Physical Anesthesia Plan  ASA: III  Anesthesia Plan: MAC   Post-op Pain Management:    Induction: Intravenous  Airway Management Planned: Natural Airway  Additional Equipment:   Intra-op Plan:   Post-operative Plan:   Informed Consent: I have reviewed the patients History and Physical, chart, labs and discussed the procedure including the risks, benefits and alternatives for the proposed anesthesia with the patient or authorized  representative who has indicated his/her understanding and acceptance.     Plan Discussed with: CRNA  Anesthesia Plan Comments:        Anesthesia Quick Evaluation

## 2015-11-01 NOTE — Transfer of Care (Signed)
Immediate Anesthesia Transfer of Care Note  Patient: ZAKARIYYA HELFMAN  Procedure(s) Performed: Procedure(s): COLONOSCOPY WITH PROPOFOL (N/A) ESOPHAGOGASTRODUODENOSCOPY (EGD) WITH PROPOFOL (N/A)  Patient Location: PACU  Anesthesia Type:MAC  Level of Consciousness: awake, alert , oriented and patient cooperative  Airway & Oxygen Therapy: Patient Spontanous Breathing and Patient connected to nasal cannula oxygen  Post-op Assessment: Report given to RN, Post -op Vital signs reviewed and stable and Patient moving all extremities X 4  Post vital signs: stable  Last Vitals:  Filed Vitals:   11/01/15 0535 11/01/15 1153  BP: 127/83 144/90  Pulse: 63 64  Temp: 36.4 C 36.8 C  Resp: 18 9    Complications: No apparent anesthesia complications

## 2015-11-01 NOTE — Op Note (Signed)
[facility] [place], [address]   COLONOSCOPY PROCEDURE REPORT  PATIENT: Jacob Adams, Jacob Adams  MR#: [id number] BIRTHDATE: 1966/03/04 , 49  yrs. old GENDER: male ENDOSCOPIST: Jerene Bears, MD REFERRED AX:[facility] PROCEDURE DATE:  11/01/2015 PROCEDURE:   Colonoscopy, diagnostic First Screening Colonoscopy - Avg.  risk and is 50 yrs.  old or older - No.  Prior Negative Screening - Now for repeat screening. N/A  History of Adenoma - Now for follow-up colonoscopy & has been > or = to 3 yrs.  N/A  Polyps removed today? No ASA CLASS:   Class III INDICATIONS:rectal bleeding. MEDICATIONS: Monitored anesthesia care and Per Anesthesia  DESCRIPTION OF PROCEDURE:   After the risks benefits and alternatives of the procedure were thoroughly explained, informed consent was obtained.  The digital rectal exam revealed no abnormalities of the rectum.   The Pentax Colonoscope [id number] endoscope was introduced through the anus and advanced to the cecum, which was identified by both the appendix and ileocecal valve. No adverse events experienced.   The quality of the prep was good, using MoviPrep  The instrument was then slowly withdrawn as the colon was fully examined. Estimated blood loss is zero unless otherwise noted in this procedure report.      COLON FINDINGS: A normal appearing cecum, ileocecal valve, and appendiceal orifice were identified.  the ascending, transverse, descending, sigmoid colon, and rectum appeared unremarkable. Retroflexed views revealed medium-sized internal hemorrhoids. The time to cecum = 2.4 Withdrawal time = 13.0   The scope was withdrawn and the procedure completed.  COMPLICATIONS: There were no complications.  ENDOSCOPIC IMPRESSION: Normal colonoscopy with prolapsing internal hemorrhoids, the later the most likely source of rectal bleeding  RECOMMENDATIONS: 1.  Trial of hydrocortisone suppository 25 mg twice a day -3-5  days 2.  If hemorrhoidal bleeding continues would recommend outpatient hemorrhoidal banding  eSigned:  Jerene Bears, MD 11/01/2015 2:02 PM   cc:  the patient

## 2015-11-02 ENCOUNTER — Encounter (HOSPITAL_COMMUNITY): Payer: Self-pay | Admitting: Internal Medicine

## 2015-11-02 ENCOUNTER — Inpatient Hospital Stay (HOSPITAL_COMMUNITY)
Admission: AD | Admit: 2015-11-02 | Discharge: 2015-11-05 | DRG: 885 | Disposition: A | Discharge status: Home / Self Care | Payer: Federal, State, Local not specified - Other | Source: Intra-hospital | Attending: Psychiatry | Admitting: Psychiatry

## 2015-11-02 DIAGNOSIS — I10 Essential (primary) hypertension: Secondary | ICD-10-CM | POA: Diagnosis present

## 2015-11-02 DIAGNOSIS — M1990 Unspecified osteoarthritis, unspecified site: Secondary | ICD-10-CM | POA: Diagnosis present

## 2015-11-02 DIAGNOSIS — G4700 Insomnia, unspecified: Secondary | ICD-10-CM | POA: Diagnosis present

## 2015-11-02 DIAGNOSIS — Z818 Family history of other mental and behavioral disorders: Secondary | ICD-10-CM

## 2015-11-02 DIAGNOSIS — K649 Unspecified hemorrhoids: Secondary | ICD-10-CM | POA: Diagnosis present

## 2015-11-02 DIAGNOSIS — F319 Bipolar disorder, unspecified: Secondary | ICD-10-CM | POA: Diagnosis present

## 2015-11-02 DIAGNOSIS — R45851 Suicidal ideations: Secondary | ICD-10-CM | POA: Diagnosis present

## 2015-11-02 DIAGNOSIS — E7800 Pure hypercholesterolemia, unspecified: Secondary | ICD-10-CM | POA: Diagnosis present

## 2015-11-02 DIAGNOSIS — K921 Melena: Secondary | ICD-10-CM | POA: Diagnosis not present

## 2015-11-02 DIAGNOSIS — F17210 Nicotine dependence, cigarettes, uncomplicated: Secondary | ICD-10-CM | POA: Diagnosis present

## 2015-11-02 DIAGNOSIS — K219 Gastro-esophageal reflux disease without esophagitis: Secondary | ICD-10-CM | POA: Diagnosis present

## 2015-11-02 DIAGNOSIS — K922 Gastrointestinal hemorrhage, unspecified: Secondary | ICD-10-CM | POA: Diagnosis not present

## 2015-11-02 DIAGNOSIS — F1094 Alcohol use, unspecified with alcohol-induced mood disorder: Secondary | ICD-10-CM | POA: Diagnosis present

## 2015-11-02 DIAGNOSIS — F333 Major depressive disorder, recurrent, severe with psychotic symptoms: Secondary | ICD-10-CM | POA: Diagnosis not present

## 2015-11-02 DIAGNOSIS — F1020 Alcohol dependence, uncomplicated: Secondary | ICD-10-CM | POA: Diagnosis present

## 2015-11-02 DIAGNOSIS — F339 Major depressive disorder, recurrent, unspecified: Secondary | ICD-10-CM | POA: Diagnosis present

## 2015-11-02 LAB — BASIC METABOLIC PANEL
ANION GAP: 9 (ref 5–15)
BUN: 8 mg/dL (ref 6–20)
CHLORIDE: 105 mmol/L (ref 101–111)
CO2: 26 mmol/L (ref 22–32)
Calcium: 9.1 mg/dL (ref 8.9–10.3)
Creatinine, Ser: 0.82 mg/dL (ref 0.61–1.24)
GFR calc non Af Amer: >60 mL/min (ref >60)
Glucose, Bld: 105 mg/dL — ABNORMAL HIGH (ref 65–99)
Potassium: 3.9 mmol/L (ref 3.5–5.1)
SODIUM: 140 mmol/L (ref 135–145)

## 2015-11-02 LAB — CBC
HEMATOCRIT: 33.4 % — AB (ref 39.0–52.0)
HEMOGLOBIN: 10.5 g/dL — AB (ref 13.0–17.0)
MCH: 28.3 pg (ref 26.0–34.0)
MCHC: 31.4 g/dL (ref 30.0–36.0)
MCV: 90 fL (ref 78.0–100.0)
Platelets: 165 10*3/uL (ref 150–400)
RBC: 3.71 MIL/uL — AB (ref 4.22–5.81)
RDW: 18.4 % — ABNORMAL HIGH (ref 11.5–15.5)
WBC: 4.4 10*3/uL (ref 4.0–10.5)

## 2015-11-02 MED ORDER — HYDROCORTISONE ACETATE 25 MG RE SUPP: 25.0000 mg | Freq: Two times a day (BID) | RECTAL | Status: DC

## 2015-11-02 MED ORDER — ADULT MULTIVITAMIN W/MINERALS CH: 1.0000 | ORAL_TABLET | Freq: Every day | ORAL | Status: DC

## 2015-11-02 MED ORDER — ONDANSETRON HCL 4 MG PO TABS: 4.0000 mg | ORAL_TABLET | Freq: Three times a day (TID) | ORAL | Status: DC | PRN

## 2015-11-02 MED ORDER — MAGNESIUM HYDROXIDE 400 MG/5ML PO SUSP: 30.0000 mL | Freq: Every day | ORAL | Status: DC | PRN

## 2015-11-02 MED ORDER — NICOTINE 21 MG/24HR TD PT24
21.0000 mg | MEDICATED_PATCH | Freq: Every day | TRANSDERMAL | Status: DC
  Administered 2015-11-02 – 2015-11-05 (×4): 21 mg via TRANSDERMAL
  Filled 2015-11-02 (×9): qty 1

## 2015-11-02 MED ORDER — IBUPROFEN 600 MG PO TABS: 600.0000 mg | ORAL_TABLET | Freq: Three times a day (TID) | ORAL | Status: DC | PRN

## 2015-11-02 MED ORDER — ALUM & MAG HYDROXIDE-SIMETH 200-200-20 MG/5ML PO SUSP: 30.0000 mL | ORAL | Status: DC | PRN

## 2015-11-02 MED ORDER — ZOLPIDEM TARTRATE 5 MG PO TABS
5.0000 mg | ORAL_TABLET | Freq: Every evening | ORAL | Status: DC | PRN
  Administered 2015-11-02: 5 mg via ORAL
  Filled 2015-11-02: qty 1

## 2015-11-02 MED ORDER — THIAMINE HCL 100 MG PO TABS: 100.0000 mg | ORAL_TABLET | Freq: Every day | ORAL | Status: DC

## 2015-11-02 NOTE — Tx Team (Signed)
Initial Interdisciplinary Treatment Plan   PATIENT STRESSORS: Educational concerns Financial difficulties Health problems Legal issue   PATIENT STRENGTHS: Ability for insight Active sense of humor Average or above average intelligence Capable of independent living   PROBLEM LIST: Problem List/Patient Goals Date to be addressed Date deferred Reason deferred Estimated date of resolution  Bipolar with SI 11/02/2015     Alcoholism 11/02/2015     GERD HTN Pancreatitis 11/02/2015                 " Im gladIm back" 11/02/2015     " I want to be sober" 11/02/2015                  DISCHARGE CRITERIA:  Ability to meet basic life and health needs Adequate post-discharge living arrangements Improved stabilization in mood, thinking, and/or behavior Medical problems require only outpatient monitoring Motivation to continue treatment in a less acute level of care  PRELIMINARY DISCHARGE PLAN: Attend aftercare/continuing care group Attend PHP/IOP Attend 12-step recovery group Outpatient therapy Participate in family therapy  PATIENT/FAMIILY INVOLVEMENT: This treatment plan has been presented to and reviewed with the patient, Jacob Adams, and/or family member,   The patient and family have been given the opportunity to ask questions and make suggestions.  Lauralyn Primes 11/02/2015, 7:16 PM

## 2015-11-02 NOTE — Progress Notes (Signed)
Report called to [place], RN over at [employer]. Update given on pt as he is returning to the unit. All questions answered. Transportation has been called. Pt alert and oriented in NAD at this time.

## 2015-11-02 NOTE — Discharge Instructions (Signed)

## 2015-11-02 NOTE — Progress Notes (Signed)
Patient did attend the evening speaker AA meeting.  

## 2015-11-02 NOTE — Anesthesia Postprocedure Evaluation (Signed)
Anesthesia Post Note  Patient: Jacob Adams  Procedure(s) Performed: Procedure(s) (LRB): COLONOSCOPY WITH PROPOFOL (N/A) ESOPHAGOGASTRODUODENOSCOPY (EGD) WITH PROPOFOL (N/A)  Patient location during evaluation: PACU Anesthesia Type: MAC Level of consciousness: awake and alert Pain management: pain level controlled Vital Signs Assessment: post-procedure vital signs reviewed and stable Respiratory status: spontaneous breathing, nonlabored ventilation, respiratory function stable and patient connected to nasal cannula oxygen Cardiovascular status: stable and blood pressure returned to baseline Anesthetic complications: no    Last Vitals:  Filed Vitals:   11/01/15 1459 11/02/15 0610  BP: 126/86 124/81  Pulse: 81 86  Temp: 36.9 C 36.7 C  Resp: 18 16    Last Pain:  Filed Vitals:   11/02/15 0737  PainSc: 0-No pain                 Effie Berkshire

## 2015-11-02 NOTE — Clinical Social Work Note (Signed)
CSW faxed voluntary form to [facility] and called [facility] for transportation. Pt will be admitted to [facility] today in the 300 hall, per disposition.    Cindra Presume, LCSW [phone] Hospital psychiatric & 5E, 5W 27-63 Licensed Clinical Social Worker

## 2015-11-02 NOTE — Discharge Summary (Addendum)
[facility]  Physician Discharge Summary   Patient ID: Jacob Adams MRN: [id number] DOB/AGE: [birth year] 50 y.o.  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 Patient to be transferred to behavioral health 2. Patient to follow-up with gastroenterology for further issues with hemorrhoidal bleeding   DISCHARGE CONDITION: fair  Diet recommendation: Low-sodium  Filed Weights   10/31/15 0724 11/01/15 1153  Weight: 94.348 kg (208 lb) 88.905 kg (196 lb)    INITIAL HISTORY: 50 year old African-American male with a past medical history of hypertension, pancreatitis, bipolar disorder, depression, alcohol abuse, presented to the emergency department on January 9, with homicidal and suicidal ideation. He was under evaluation by behavioral health. During his stay in the emergency department he was noted to have a bloody bowel movement. This persisted. Subsequently, he was hospitalized for further management of same.  Consultations:  Gastroenterology  Psychiatry  Procedures: EGD ENDOSCOPIC IMPRESSION: 1. The mucosa of the esophagus appeared normal 2. Gastritis (inflammation) was found at the incisura; multiple biopsies 3. The duodenal mucosa showed no abnormalities in the bulb and 2nd part of the duodenum  Colonoscopy ENDOSCOPIC IMPRESSION: Normal colonoscopy with prolapsing internal hemorrhoids, the latter the most likely source of rectal bleeding  HOSPITAL  COURSE:   Acute lower GI bleeding Bleeding appears to be secondary to hemorrhoids. Hydrocortisone supp recommended. No other abnormalities noted on colonoscopy. Some gastritis was noted on EGD. Biopsies have been taken and pathology is pending. Patient does have a history of alcohol abuse, which could've contributed to the gastritis. He should continue his PPI. If he has further episodes of bleeding from his hemorrhoids he may be a candidate for outpatient banding. For now, bleeding appears to have subsided. His hemoglobin has been stable.   Alcohol dependence/abuse Stable. No signs or symptoms of withdrawal. He needs to be on thiamine and multivitamins. His last alcoholic drink was more than 3 days ago. Was counseled.  Pancytopenia Most likely due to marrow suppression by alcohol. Stable.  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 He is on fluoxetine and Haldol.  Transaminitis Alcohol induced. Stable.  Suicidal ideations This was the initial reason for his presentation to the emergency department. Due to his GI bleeding. He had to be hospitalized. He was seen by psychiatry today. They recommend transfer to behavioral health for further psychiatric treatment. He is medically stable.   Okay for transfer to behavioral health.    PERTINENT LABS:  The results of significant diagnostics from this hospitalization (including imaging, microbiology, ancillary and laboratory) are listed below for reference.     Labs: Basic Metabolic Panel:  Recent Labs Lab 10/29/15 2140 11/01/15 0410 11/02/15 0413  NA 136 141 140  K 3.6 3.8 3.9  CL 101 107 105  CO2 23 26 26   GLUCOSE 105* 104* 105*  BUN 7 <5* 8  CREATININE 0.82 0.77 0.82  CALCIUM 9.1 8.7* 9.1   Liver Function Tests:  Recent Labs Lab 10/29/15 2140  AST 53*  ALT 27  ALKPHOS 62  BILITOT 0.7  PROT 8.3*  ALBUMIN 4.4    CBC:  Recent Labs Lab 10/29/15 2140 10/31/15 0537 11/01/15 0410 11/02/15 0413  WBC 4.6 3.4* 3.3* 4.4  NEUTROABS  --  1.5*  --   --   HGB 12.9* 10.7* 11.2* 10.5*  HCT 38.9* 33.6* 35.5* 33.4*  MCV 85.9 86.8 89.6 90.0  PLT 176 134* 161 165   CBG:  Recent Labs Lab 10/30/15 2135  GLUCAP 157*     IMAGING STUDIES Ct Abdomen Pelvis W Contrast  10/31/2015  CLINICAL DATA:  50 year old male with abdominal pain and blood in stool. EXAM: CT ABDOMEN AND PELVIS WITH CONTRAST TECHNIQUE: Multidetector CT imaging of the abdomen and pelvis was performed using the standard protocol following bolus administration of intravenous contrast. CONTRAST:  OMNIPAQUE IOHEXOL 300 MG/ML  SOLN COMPARISON:  CT dated 11/18/2009 FINDINGS: Minimal bibasilar dependent atelectatic changes. The visualized lung bases are otherwise clear. No intra-abdominal free air or free fluid. The liver, gallbladder, pancreas, spleen, adrenal glands appear unremarkable. A 1 cm stable appearing right renal exophytic hypodense lesion, most likely a cyst. There is no hydronephrosis on either side. The visualized ureters and urinary bladder appear unremarkable. The prostate and seminal vesicles are grossly unremarkable. Moderate stool throughout the colon. No evidence of bowel obstruction or inflammation. Normal appendix. The abdominal aorta and IVC appear patent. No portal venous gas identified. There is no adenopathy. Small fat containing umbilical hernia. The osseous structures are intact. IMPRESSION: No acute intra-abdominal or pelvic pathology. Electronically Signed   By: Elgie Collard M.D.   On: 10/31/2015 04:05    DISCHARGE EXAMINATION: Filed Vitals:   11/01/15 1410 11/01/15 1459 11/02/15 0610 11/02/15 1358  BP: 131/85 126/86 124/81 125/78  Pulse: 64 81 86 87  Temp:  98.4 F (36.9 C) 98.1 F (36.7 C) 98.9 F (37.2 C)  TempSrc:  Oral Oral Oral  Resp: 15 18 16 20   Height:      Weight:      SpO2: 100% 100% 98% 100%    General appearance: alert, cooperative, appears stated age and no distress Resp: clear to auscultation bilaterally Cardio: regular rate and rhythm, S1, S2 normal, no murmur, click, rub or gallop GI: soft, non-tender; bowel sounds normal; no masses,  no organomegaly Extremities: extremities normal, atraumatic, no cyanosis or edema  DISPOSITION: Behavioral Health  Discharge Instructions    Call MD for:  difficulty breathing, headache or visual disturbances    Complete by:  As directed      Call MD for:  extreme fatigue    Complete by:  As directed      Call MD for:  persistant dizziness or light-headedness    Complete by:  As directed      Call MD for:  persistant nausea and vomiting    Complete by:  As directed      Call MD for:  severe uncontrolled pain    Complete by:  As directed      Diet - low sodium heart healthy    Complete by:  As directed      Discharge instructions    Complete by:  As directed   Please follow-up with the gastroenterologist if you have recurrence of bleeding.  You were cared for by a hospitalist during your hospital stay. If you have any questions about your discharge medications or the care you received while you were in the  hospital after you are discharged, you can call the unit and asked to speak with the hospitalist on call if the hospitalist that took care of you is not available. Once you are discharged, your primary care physician will handle any further medical issues. Please note that NO REFILLS for any discharge medications will be authorized once you are discharged, as it is imperative that you return to your primary care physician (or establish a relationship with a primary care physician if you do not have one) for your aftercare needs so that they can reassess your need for medications and monitor your lab values. If you do not have a primary care physician, you can call [phone] for a physician referral.     Increase activity slowly    Complete  by:  As directed            ALLERGIES:  Allergies  Allergen Reactions  . Uncoded Nonscreenable Allergen Other (See Comments)    Sun tan lotion: swelling and peeling  . Acetaminophen Other (See Comments)    Make he side hurt; he stated he has liver damage.   . Ibuprofen Swelling    Swelling and discomfort of stomach  . Nsaids Other (See Comments)    Swelling and discomfort of stomach  . Penicillins Itching, Swelling and Rash    Has patient had a PCN reaction causing immediate rash, facial/tongue/throat swelling, SOB or lightheadedness with hypotension: yes Has patient had a PCN reaction causing severe rash involving mucus membranes or skin necrosis: no Has patient had a PCN reaction that required hospitalization yes, happened while hospitalized Has patient had a PCN reaction occurring within the last 10 years: yes If all of the above answers are "NO", then may proceed with Cephalosporin use.   . [name] Other (See Comments)    DOESN'T EAT PORK-patient preference     Current Discharge Medication List    START taking these medications   Details  hydrocortisone (ANUSOL-HC) 25 MG suppository Place 1 suppository (25 mg total) rectally 2 (two) times daily. Qty: 12 suppository, Refills: 0    Multiple Vitamin (MULTIVITAMIN WITH MINERALS) TABS tablet Take 1 tablet by mouth daily. Qty: 30 tablet, Refills: 0    thiamine 100 MG tablet Take 1 tablet (100 mg total) by mouth daily. Qty: 30 tablet, Refills: 0      CONTINUE these medications which have NOT CHANGED   Details  amLODipine (NORVASC) 10 MG tablet Take 10 mg by mouth at bedtime.     atorvastatin (LIPITOR) 40 MG tablet Take 1 tablet (40 mg total) by mouth daily. Qty: 30 tablet, Refills: 0    cholecalciferol (VITAMIN D) 1000 UNITS tablet Take 1,000 Units by mouth at bedtime.    FLUoxetine (PROZAC) 20 MG capsule Take 1 capsule (20 mg total) by mouth daily. Qty: 30 capsule, Refills: 0    gabapentin  (NEURONTIN) 100 MG capsule Take 2 capsules (200 mg total) by mouth 2 (two) times daily. For mood stability Qty: 60 capsule, Refills: 0    haloperidol (HALDOL) 5 MG tablet Take 1 tablet (5 mg total) by mouth at bedtime. Qty: 30 tablet, Refills: 0    meloxicam (MOBIC) 15 MG tablet Take 15 mg by mouth daily as needed for pain.    pantoprazole (PROTONIX) 40 MG tablet Take 1 tablet (40 mg total) by mouth 2 (two) times daily. Qty: 72 tablet, Refills: 0    sildenafil (VIAGRA) 100 MG tablet Take 100 mg by mouth daily as needed for erectile  dysfunction.    traZODone (DESYREL) 50 MG tablet Take 50 mg by mouth at bedtime as needed for sleep.      STOP taking these medications     sertraline (ZOLOFT) 100 MG tablet        Follow-up Information    Schedule an appointment as soon as possible for a visit with PYRTLE, Carie Caddy, MD.   Specialty:  Gastroenterology   Why:  As needed for recurrent bleeding   Contact information:   [address] [place] [address] [phone]       TOTAL DISCHARGE TIME: 35 mins  [facility]  [facility] Pager [phone]  11/02/2015, 3:16 PM

## 2015-11-02 NOTE — Progress Notes (Signed)
Patient can be accepted to [facility] when medically cleared by attending physician. Labs reviewed by Agustina Caroli, NP and has approved admission to [facility], RN.

## 2015-11-03 DIAGNOSIS — F339 Major depressive disorder, recurrent, unspecified: Secondary | ICD-10-CM | POA: Diagnosis present

## 2015-11-03 DIAGNOSIS — F1020 Alcohol dependence, uncomplicated: Secondary | ICD-10-CM | POA: Diagnosis present

## 2015-11-03 DIAGNOSIS — F1094 Alcohol use, unspecified with alcohol-induced mood disorder: Secondary | ICD-10-CM | POA: Diagnosis present

## 2015-11-03 DIAGNOSIS — F333 Major depressive disorder, recurrent, severe with psychotic symptoms: Secondary | ICD-10-CM | POA: Diagnosis present

## 2015-11-03 MED ORDER — SUMATRIPTAN 20 MG/ACT NA SOLN
20.0000 mg | NASAL | Status: DC | PRN
  Administered 2015-11-05: 20 mg via NASAL
  Filled 2015-11-03 (×2): qty 1

## 2015-11-03 MED ORDER — ZOLPIDEM TARTRATE 10 MG PO TABS
10.0000 mg | ORAL_TABLET | Freq: Every evening | ORAL | Status: DC | PRN
  Administered 2015-11-03 – 2015-11-04 (×3): 10 mg via ORAL
  Filled 2015-11-03 (×2): qty 1

## 2015-11-03 MED ORDER — PANTOPRAZOLE SODIUM 40 MG PO TBEC
40.0000 mg | DELAYED_RELEASE_TABLET | Freq: Two times a day (BID) | ORAL | Status: DC
  Administered 2015-11-03 – 2015-11-05 (×5): 40 mg via ORAL
  Filled 2015-11-03 (×4): qty 1
  Filled 2015-11-03: qty 14
  Filled 2015-11-03 (×4): qty 1
  Filled 2015-11-03: qty 14

## 2015-11-03 MED ORDER — TRAZODONE HCL 50 MG PO TABS: 50.0000 mg | ORAL_TABLET | Freq: Every evening | ORAL | Status: DC | PRN

## 2015-11-03 MED ORDER — AMLODIPINE BESYLATE 10 MG PO TABS
10.0000 mg | ORAL_TABLET | Freq: Every day | ORAL | Status: DC
  Administered 2015-11-03 – 2015-11-04 (×2): 10 mg via ORAL
  Filled 2015-11-03: qty 7
  Filled 2015-11-03 (×3): qty 1

## 2015-11-03 MED ORDER — ADULT MULTIVITAMIN W/MINERALS CH
1.0000 | ORAL_TABLET | Freq: Every day | ORAL | Status: DC
  Administered 2015-11-03 – 2015-11-05 (×3): 1 via ORAL
  Filled 2015-11-03 (×6): qty 1

## 2015-11-03 MED ORDER — HALOPERIDOL 5 MG PO TABS
5.0000 mg | ORAL_TABLET | Freq: Every day | ORAL | Status: DC
  Administered 2015-11-03 – 2015-11-04 (×2): 5 mg via ORAL
  Filled 2015-11-03 (×2): qty 1
  Filled 2015-11-03: qty 7
  Filled 2015-11-03: qty 1

## 2015-11-03 MED ORDER — ALUM & MAG HYDROXIDE-SIMETH 200-200-20 MG/5ML PO SUSP: 30.0000 mL | ORAL | Status: DC | PRN

## 2015-11-03 MED ORDER — VITAMIN B-1 100 MG PO TABS
100.0000 mg | ORAL_TABLET | Freq: Every day | ORAL | Status: DC
  Administered 2015-11-03 – 2015-11-05 (×3): 100 mg via ORAL
  Filled 2015-11-03 (×6): qty 1

## 2015-11-03 MED ORDER — GABAPENTIN 100 MG PO CAPS
200.0000 mg | ORAL_CAPSULE | Freq: Two times a day (BID) | ORAL | Status: DC
  Administered 2015-11-03 – 2015-11-05 (×5): 200 mg via ORAL
  Filled 2015-11-03 (×4): qty 2
  Filled 2015-11-03 (×2): qty 28
  Filled 2015-11-03 (×4): qty 2

## 2015-11-03 MED ORDER — FLUOXETINE HCL 20 MG PO CAPS
20.0000 mg | ORAL_CAPSULE | Freq: Every day | ORAL | Status: DC
  Administered 2015-11-03 – 2015-11-05 (×3): 20 mg via ORAL
  Filled 2015-11-03: qty 7
  Filled 2015-11-03 (×5): qty 1

## 2015-11-03 MED ORDER — HYDROCORTISONE ACETATE 25 MG RE SUPP
25.0000 mg | Freq: Two times a day (BID) | RECTAL | Status: DC
  Administered 2015-11-03 – 2015-11-05 (×5): 25 mg via RECTAL
  Filled 2015-11-03 (×9): qty 1

## 2015-11-03 MED ORDER — VITAMIN D3 25 MCG (1000 UNIT) PO TABS
1000.0000 [IU] | ORAL_TABLET | Freq: Every day | ORAL | Status: DC
  Administered 2015-11-03 – 2015-11-04 (×2): 1000 [IU] via ORAL
  Filled 2015-11-03 (×4): qty 1

## 2015-11-03 MED ORDER — MELOXICAM 15 MG PO TABS: 15.0000 mg | ORAL_TABLET | Freq: Every day | ORAL | Status: DC

## 2015-11-03 MED ORDER — METHOCARBAMOL 500 MG PO TABS
500.0000 mg | ORAL_TABLET | Freq: Four times a day (QID) | ORAL | Status: DC | PRN
  Administered 2015-11-03 – 2015-11-04 (×3): 500 mg via ORAL
  Filled 2015-11-03 (×3): qty 1

## 2015-11-03 MED ORDER — ATORVASTATIN CALCIUM 40 MG PO TABS
40.0000 mg | ORAL_TABLET | Freq: Every day | ORAL | Status: DC
  Administered 2015-11-03 – 2015-11-05 (×3): 40 mg via ORAL
  Filled 2015-11-03 (×4): qty 1
  Filled 2015-11-03: qty 7
  Filled 2015-11-03: qty 1

## 2015-11-03 NOTE — Progress Notes (Signed)
D.  Pt pleasant on approach, complaint of insomnia since Thursday.  Pt requested a book to read to help with rest.  Pt was positive for evening wrap up group, interacting appropriately with peers on the unit.  Denies SI/HI/hallucinations at this time . A.  Support and encouragement offered.  Medication given as ordered.  Found patient a book in 400 hall day room.  R.  Pt remains safe on unit, will continue to monitor.

## 2015-11-03 NOTE — Progress Notes (Signed)
D:Per patient self inventory form pt reports poor sleep last night with the use of sleep medication. Pt reports a good appetite, normal energy level, good concentration. Pt rates depression 4/10, hopelessness 2/10, anxiety 6/10- all on 0-10 scale, 10 being the worse. Pt denies SI/HI. Denies AVH. Pt reports his goal is "my exit plan" and that he will "think of ways meet my goals" to help meet his goal today. Pt pleasant on approach.   A:Special checks q 15 mins in place for safety. Medication administered per MD order(See eMAR) Encouragement and support provided.  R:safety maintained. Compliant with medication regimen. Will continue to monitor.
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
## 2015-11-03 NOTE — BHH Group Notes (Signed)
[facility] Group Notes:  (Clinical Social Work)   08/18/2015     10:00-11:00AM  Summary of Progress/Problems:   In today's process group a decisional balance exercise was used to explore in depth the perceived benefits and costs of alcohol and drugs, as well as the  benefits and costs of replacing these with healthy coping skills.  Patients listed healthy and unhealthy coping techniques, determining with CSW guidance that unhealthy coping techniques work initially, but eventually become harmful.  Motivational Interviewing and the whiteboard were utilized for the exercises.  The patient expressed that the unhealthy coping he often uses is giving up, becoming suicidal as well as homicidal, and using alcohol.  He participated heavily in group, was very attentive and contributed to the discussion well.    Type of Therapy:  Group Therapy - Process   Participation Level:  Active  Participation Quality:  Attentive, Sharing and Supportive  Affect:  Appropriate  Cognitive:  Appropriate and Oriented  Insight:  Engaged  Engagement in Therapy:  Engaged  Modes of Intervention:  Education, Motivational Interviewing  Selmer Dominion, LCSW 11/03/2015, 12:16 PM

## 2015-11-03 NOTE — Progress Notes (Signed)
EKG completed , given to [facility], [place] for review, no new orders, placed on front of pt chart.

## 2015-11-03 NOTE — BHH Suicide Risk Assessment (Signed)
[facility] Admission Suicide Risk Assessment   Nursing information obtained from:    Demographic factors:    Current Mental Status:    Loss Factors:    Historical Factors:    Risk Reduction Factors:    Total Time spent with patient: 30 minutes Principal Problem: MDD (major depressive disorder), recurrent, severe, with psychosis ([place]) Diagnosis:   Patient Active Problem List   Diagnosis Date Noted  . MDD (major depressive disorder), recurrent, severe, with psychosis ([place]) [F33.3] 11/03/2015  . Alcohol use disorder, severe, dependence ([place]) [F10.20] 11/03/2015  . Bleeding internal hemorrhoids [K64.8]   . Acute lower GI bleeding [K92.2] 10/31/2015  . Pancytopenia ([place]) [Y30.160] 10/31/2015  . 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 Suicidal intent [R45.851] 10/31/2015  . Transaminitis [R74.0] 10/31/2015  . Hematochezia [K92.1]   . Hematemesis [K92.0]   . Acute blood loss anemia [D62]      Continued Clinical Symptoms:  Alcohol Use Disorder Identification Test Final Score (AUDIT): 34 The "Alcohol Use Disorders Identification Test", Guidelines for Use in Primary Care, Second Edition.  World [facility]). Score between 0-7:  no or low risk or alcohol related problems. Score between 8-15:  moderate risk of alcohol related problems. Score between 16-19:  high risk of alcohol related problems. Score 20 or above:  warrants further diagnostic evaluation for alcohol dependence and treatment.   CLINICAL FACTORS:   Alcohol/Substance Abuse/Dependencies Previous Psychiatric Diagnoses and Treatments Medical Diagnoses and Treatments/Surgeries   Musculoskeletal: Strength & Muscle Tone: within normal limits Gait & Station: normal Patient leans: N/A  Psychiatric Specialty Exam: Physical Exam  Review of Systems  Psychiatric/Behavioral: Positive for depression and substance abuse. The patient is nervous/anxious and has insomnia.   All other  systems reviewed and are negative.   Blood pressure 129/93, pulse 86, temperature 98 F (36.7 C), temperature source Oral, resp. rate 20, height 5' 10"  (1.778 m), weight 88.905 kg (196 lb), SpO2 100 %.Body mass index is 28.12 kg/(m^2).  General Appearance: Casual  Eye Contact::  Fair  Speech:  Clear and Coherent  Volume:  Normal  Mood:  Anxious and Depressed  Affect:  Congruent  Thought Process:  Coherent  Orientation:  Full (Time, Place, and Person)  Thought Content:  Rumination  Suicidal Thoughts:  No  Homicidal Thoughts:  No  Memory:  Immediate;   Fair Recent;   Fair Remote;   Fair  Judgement:  Fair  Insight:  Shallow  Psychomotor Activity:  Restlessness  Concentration:  Fair  Recall:  [employer] of Knowledge:Fair  Language: Fair  Akathisia:  No  Handed:  Right  AIMS (if indicated):     Assets:  Desire for Improvement  Sleep:  Number of Hours: 0  Cognition: WNL  ADL's:  Intact     COGNITIVE FEATURES THAT CONTRIBUTE TO RISK:  Closed-mindedness, Polarized thinking and Thought constriction (tunnel vision)    SUICIDE RISK:   Mild:  Suicidal ideation of limited frequency, intensity, duration, and specificity.  There are no identifiable plans, no associated intent, mild dysphoria and related symptoms, good self-control (both objective and subjective assessment), few other risk factors, and identifiable protective factors, including available and accessible social support.  PLAN OF CARE: Patient will benefit from inpatient treatment and stabilization.  Estimated length of stay is 5-7 days.  Reviewed past medical records,treatment plan.  Discussed case with Aggie NP - please also see H&P. Will continue Prozac 20 mg for affective sx. Will continue Gabapentin 200 mg  po bid for anxiety sx. Will continue Haldol 5 mg po qhs for mood sx/psychosis. Will increase Ambien to 10 mg po qhs for sleep. DC Trazodone since pt reports lack of efficacy. Will continue to monitor vitals  ,medication compliance and treatment side effects while patient is here.  Will monitor for medical issues as well as call consult as needed.  Reviewed labs ,will order tsh, lipid panel, hba1c, pl as well as EKG for qtc. CSW will start working on disposition.  Patient to participate in therapeutic milieu .       Medical Decision Making:  Review of Psycho-Social Stressors (1), Review or order clinical lab tests (1), Decision to obtain old records (1), Established Problem, Worsening (2), Review of Last Therapy Session (1), Review or order medicine tests (1) and Review of New Medication or Change in Dosage (2)  I certify that inpatient services furnished can reasonably be expected to improve the patient's condition.   Zoltan Genest MD 11/03/2015, 1:23 PM

## 2015-11-03 NOTE — Plan of Care (Signed)
Problem: Alteration in mood; excessive anxiety as evidenced by: Goal: STG-Pt will report an absence of self-harm thoughts/actions (Patient will report an absence of self-harm thoughts or actions)  Outcome: Progressing Pt denies SI
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
## 2015-11-03 NOTE — Progress Notes (Signed)
D: Jacob Adams is slightly guarded during our initial conversation. He eventually warmed. He explained to me that he had a lot on his mind in regards to a phone call he received from his son being involved in an argument with someone earlier today. We discussed ways for Jacob Adams to channel his emotions and he was able to sit down and read a book a few minutes later. He rates Anxiety 7/10 Depression 4/10. He also states he is allergic to Tylenol and Ibuprofen and this causes GI upset. He does not want to take these since he has a history of GI Bleed.  A: Q 15 minute checks for patient safety. Encouragement and support given. Medications administered as prescribed.  R: Continue to monitor for patient safety and medication effectiveness.

## 2015-11-03 NOTE — Plan of Care (Signed)
Problem: Alteration in mood & ability to function due to Goal: STG-Patient will comply with prescribed medication regimen (Patient will comply with prescribed medication regimen)  Outcome: Progressing Pt complaint with medication regimen.

## 2015-11-03 NOTE — Progress Notes (Signed)
Adult Psychoeducational Group Note  Date:  11/03/2015 Time:  8:49 PM  Group Topic/Focus:  Wrap-Up Group:   The focus of this group is to help patients review their daily goal of treatment and discuss progress on daily workbooks.  Participation Level:  Active  Participation Quality:  Appropriate and Attentive  Affect:  Appropriate  Cognitive:  Appropriate  Insight: Appropriate  Engagement in Group:  Engaged  Modes of Intervention:  Discussion  Additional Comments:  Pt stated he had an okay day. Pt stated he wants to work on not letting everything overwhelm him all at once.  Clint Bolder 11/03/2015, 8:49 PM

## 2015-11-03 NOTE — BHH Group Notes (Signed)
[place] Group Notes:  (Nursing/MHT/Case Management/Adjunct)  Date:  11/03/2015  Time: 0930  Type of Therapy:  Nurse Education  Participation Level:  Active  Participation Quality:  Attentive  Affect:  Appropriate  Cognitive:  Alert and Appropriate  Insight:  Good  Engagement in Group:  Engaged  Modes of Intervention:  Activity, Clarification, Socialization and Support  Summary of Progress/Problems:  Forrestine Him 11/03/2015, 1:29 PM

## 2015-11-03 NOTE — Progress Notes (Signed)
Pt signed 72 hour request for discharge 11/03/2015 @ 1130
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
## 2015-11-04 NOTE — Progress Notes (Signed)
Met with patient 1:1. He is up and visible in the milieu. Pacing/walking in the hallway however is not agitated. Rating his depression, hopelessness at a 1/10 and anxiety at a 2/10. Does complain of mild back spasms. Reports his goal is to "keep positive" and plans to complete this goal by "focusing on me, what is good about me." Patient offered emotional support, medicated per orders and robaxin prn given for spasms with some relief. Denies SI/HI/AVH and remains safe on level III obs. Jamie Kato

## 2015-11-04 NOTE — BHH Group Notes (Signed)
[place] Group Notes:  (Nursing/MHT/Case Management/Adjunct)  Date:  11/04/2015  Time:  1400  Type of Therapy:  Nurse Education - Healthy Support Systems  Participation Level:  Active  Participation Quality:  Attentive  Affect:  Blunted  Cognitive:  Alert  Insight:  Improving  Engagement in Group:  Engaged  Modes of Intervention:  Education  Summary of Progress/Problems: Patient attended and participated appropriately in nursing education group.   Jamie Kato 11/04/2015, 2:50 PM

## 2015-11-04 NOTE — Plan of Care (Signed)
Problem: Alteration in mood & ability to function due to Goal: STG-Patient will attend groups Outcome: Progressing Patient has been attending groups.  Problem: Ineffective individual coping Goal: STG: Patient will remain free from self harm Outcome: Progressing Patient has not engaged in self harm, denies SI

## 2015-11-04 NOTE — BHH Group Notes (Signed)
[place] Group Notes:  (Clinical Social Work)  11/04/2015  10:00-11:00AM  Summary of Progress/Problems:   The main focus of today's process group was to   1)  discuss the importance of adding supports  2)  define health supports versus unhealthy supports  3)  identify the patient's current unhealthy supports and plan how to handle them  4)  Identify the patient's current healthy supports and plan what to add.  An emphasis was placed on using counselor, doctor, therapy groups, 12-step groups, and problem-specific support groups to expand supports.    The patient expressed full comprehension of the concepts presented, and agreed that there is a need to add more supports.  The patient stated several times that he really enjoyed the high that came from selling drugs, even more than taking them, saying that it made him feel powerful.  He said that changing his phone or changing his phone number simply would not work because now the data falls you around.  He was asked if he would consider blocking certain numbers.  He talked about a period of sobriety he had of 12 years, and how he relapsed when he became complacent and stopped going to meetings, started to think he could have "one" drink.  He processed several possibilities for making a new plan.  Type of Therapy:  Process Group with Motivational Interviewing  Participation Level:  Active  Participation Quality:  Attentive and Sharing  Affect:  Blunted  Cognitive:  Appropriate  Insight:  Developing/Improving  Engagement in Therapy:  Engaged  Modes of Intervention:   Education, Support and Processing, Activity  Selmer Dominion, LCSW 11/04/2015

## 2015-11-04 NOTE — Progress Notes (Signed)
Patient did attend the evening speaker AA meeting.  

## 2015-11-04 NOTE — Progress Notes (Signed)
D.  Pt pleasant on approach, wishes to watch football game tonight.  Positive for evening AA group, interacting appropriately with peers on the unit.  Denies SI/HI/hallucinations at this time.  Pt hopeful for discharge tomorrow with followup at the [facility] for outpatient.  A.  Support and encouragement offered, medication given as ordered.  R.  PT remains safe on the unit, will continue to monitor.

## 2015-11-05 ENCOUNTER — Encounter: Payer: Self-pay | Admitting: Internal Medicine

## 2015-11-05 ENCOUNTER — Encounter (HOSPITAL_COMMUNITY): Payer: Self-pay | Admitting: Psychiatry

## 2015-11-05 LAB — LIPID PANEL
Cholesterol: 200 mg/dL (ref 0–200)
HDL: 108 mg/dL (ref >40)
LDL CALC: 71 mg/dL (ref 0–99)
TRIGLYCERIDES: 103 mg/dL (ref <150)
Total CHOL/HDL Ratio: 1.9 RATIO
VLDL: 21 mg/dL (ref 0–40)

## 2015-11-05 LAB — TSH: TSH: 3.258 u[IU]/mL (ref 0.350–4.500)

## 2015-11-05 MED ORDER — AMLODIPINE BESYLATE 10 MG PO TABS: 10.0000 mg | ORAL_TABLET | Freq: Every day | ORAL | Status: DC

## 2015-11-05 MED ORDER — PANTOPRAZOLE SODIUM 40 MG PO TBEC: 40.0000 mg | DELAYED_RELEASE_TABLET | Freq: Two times a day (BID) | ORAL | Status: DC

## 2015-11-05 MED ORDER — ATORVASTATIN CALCIUM 40 MG PO TABS: 40.0000 mg | ORAL_TABLET | Freq: Every day | ORAL | Status: DC

## 2015-11-05 MED ORDER — VITAMIN D 1000 UNITS PO TABS: 1000.0000 [IU] | ORAL_TABLET | Freq: Every day | ORAL | Status: DC

## 2015-11-05 MED ORDER — HYDROCORTISONE ACETATE 25 MG RE SUPP: 25.0000 mg | Freq: Two times a day (BID) | RECTAL | Status: DC

## 2015-11-05 MED ORDER — GABAPENTIN 100 MG PO CAPS
200.0000 mg | ORAL_CAPSULE | Freq: Two times a day (BID) | ORAL | Status: DC
Start: 2015-11-05 — End: 2016-01-11

## 2015-11-05 MED ORDER — FLUOXETINE HCL 20 MG PO CAPS: 20.0000 mg | ORAL_CAPSULE | Freq: Every day | ORAL | Status: DC

## 2015-11-05 MED ORDER — ZOLPIDEM TARTRATE 10 MG PO TABS: 10.0000 mg | ORAL_TABLET | Freq: Every evening | ORAL | Status: DC | PRN

## 2015-11-05 MED ORDER — NICOTINE 21 MG/24HR TD PT24: 21.0000 mg | MEDICATED_PATCH | Freq: Every day | TRANSDERMAL | Status: DC

## 2015-11-05 MED ORDER — ADULT MULTIVITAMIN W/MINERALS CH: 1.0000 | ORAL_TABLET | Freq: Every day | ORAL | Status: DC

## 2015-11-05 MED ORDER — HALOPERIDOL 5 MG PO TABS: 5.0000 mg | ORAL_TABLET | Freq: Every day | ORAL | Status: DC

## 2015-11-05 NOTE — Progress Notes (Signed)
  [facility] Adult Case Management Discharge Plan :  Will you be returning to the same living situation after discharge:  Yes,  home At discharge, do you have transportation home?: Yes,  bus pass in chart Do you have the ability to pay for your medications: Yes,  [facility]  Release of information consent forms completed and submitted to medical records.   Patient to Follow up at: Follow-up Information    Follow up with [facility].   Why:  Walk in between 8am-9am Monday through Friday for hospital follow-up/medication management/assessment for counseling services.    Contact information:   [address], [place] [address] Phone: [phone] Fax: [phone]      Follow up with [facility].   Why:  Call office to schedule appt if interested in being seen by this provider (Office Closed Monday).    Contact information:   [place], [place] Phone: [phone] Fax: X      Next level of care provider has access to [place] and Suicide Prevention discussed: Yes,  Contact attempts made with pt's exwife. SPE completed with pt, SPI pamphlet provided to pt and he was encouraged to share this information with his support network.   Have you used any form of tobacco in the last 30 days? (Cigarettes, Smokeless Tobacco, Cigars, and/or Pipes): No  Has patient been referred to the Quitline?: N/A patient is not a smoker  Patient has been referred for addiction treatment: Pt. refused referral  Smart, Walker Sitar LCSW 11/05/2015, 10:35 AM

## 2015-11-05 NOTE — Progress Notes (Signed)
Patient ID: Jacob Adams, male   DOB: [birth year], 50 y.o.   MRN: [id number]  Pt. Denies SI/HI and A/V hallucinations. Belongings returned to patient at time of discharge. Patient denies any pain or discomfort at time of discharge. Discharge instructions and medications were reviewed with patient. Patient verbalized understanding of both medications and discharge instructions. Q15 minute safety checks maintained until discharge. No distress upon discharge. 2 bus passes were given to patient for transportation.

## 2015-11-05 NOTE — Plan of Care (Signed)
Problem: Alteration in mood & ability to function due to Goal: STG-Patient will attend groups Outcome: Progressing Pt has attended group

## 2015-11-05 NOTE — Progress Notes (Signed)
Recreation Therapy Notes  Date: 01.16.2017 Time: 9:30am Location: [address] Group Room  Group Topic: Stress Management  Goal Area(s) Addresses:  Patient will actively participate in stress management techniques presented during session.   Behavioral Response: Did not attend.   Laureen Ochs Ji Feldner, LRT/CTRS        Kali Ambler L 11/05/2015 11:48 AM
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
## 2015-11-05 NOTE — Tx Team (Signed)
Interdisciplinary Treatment Plan Update (Adult)  Date:  11/05/2015  Time Reviewed:  8:49 AM   Progress in Treatment: Attending groups: Yes. Participating in groups:  Yes. Taking medication as prescribed:  Yes. Tolerating medication:  Yes. Family/Significant othe contact made:  SPE contact attempts made with pt's exwife. SPE completed with pt.  Patient understands diagnosis:  Yes. and As evidenced by:  seeking treatment for SI, depression, medication stabilization. Discussing patient identified problems/goals with staff:  Yes. Medical problems stabilized or resolved:  Yes. Denies suicidal/homicidal ideation: Yes. Issues/concerns per patient self-inventory:  Other:  Pt signed 72 hour request for d/c on 1/14 at 1130.   Discharge Plan or Barriers:   Reason for Continuation of Hospitalization: none  Comments:  Jacob Adams is a 50 year old AA male. Initially admitted to [facility] adult unit with complaints of suicidal ideations with plans to overdose. He was here briefly when he developed gastrointestinal bleed & was transferred to the [facility] medical floor for evaluation. After the medical stabilization, Jacob Adams is being admitted back to unit to complete his treatment for depression. Today & during this re-admission assessments, he reports, "I was at the medical floor of the [facility] x 2 days. And because of the bleeding I had, I had a colonoscopy done. I was told that I have hemorrhoids with some inflammation of my upper G. I. I was very depressed when I was in this hospital 3 days ago. But, today, I feel like I can handle my emotions better. I'm at a point now with my life that I'm learning to take life a lot easier. I feel better. I would like to remain on Prozac, haldol & Ambien. I have suicidal thoughts. My family has been calling to check on me, even my ex-wife called too. I have good support system".  Estimated length of stay:  D/c today-bus passes in chart  Additional  Comments:  Patient and CSW reviewed pt's identified goals and treatment plan. Patient verbalized understanding and agreed to treatment plan. CSW reviewed [facility] "Discharge Process and Patient Involvement" Form. Pt verbalized understanding of information provided and signed form.    Review of initial/current patient goals per problem list:  1. Goal(s): Patient will participate in aftercare plan  Met: Yes  Target date: at discharge  As evidenced by: Patient will participate within aftercare plan AEB aftercare provider and housing plan at discharge being identified.  1/16: Pt plans to return home; [facility]. [facility]   2. Goal (s): Patient will exhibit decreased depressive symptoms and suicidal ideations.  Met: Yes    Target date: at discharge  As evidenced by: Patient will utilize self rating of depression at 3 or below and demonstrate decreased signs of depression or be deemed stable for discharge by MD.  1/16: Pt rates depression as 1/10 and presents with pleasant mood/calm affect. Pt denies SI/HI/AVH.    Attendees: Patient:   11/05/2015 8:49 AM   Family:   11/05/2015 8:49 AM   Physician:  Dr. Carlton Adam, MD 11/05/2015 8:49 AM   Nursing:   Everlean Cherry RN 11/05/2015 8:49 AM   Clinical Social Worker: Maxie Better, LCSW 11/05/2015 8:49 AM   Clinical Social Worker: Erasmo Downer Drinkard LCSWA; Peri Maris LCSWA 11/05/2015 8:49 AM   Other:  Gerline Legacy Nurse Case Manager 11/05/2015 8:49 AM   Other:  Agustina Caroli NP 11/05/2015 8:49 AM   Other:   11/05/2015 8:49 AM  Other:  11/05/2015 8:49 AM   Other:  11/05/2015 8:49 AM   Other:  11/05/2015 8:49 AM    11/05/2015 8:49 AM    11/05/2015 8:49 AM    11/05/2015 8:49 AM    11/05/2015 8:49 AM    Scribe for Treatment Team:   Maxie Better, LCSW 11/05/2015 8:49 AM

## 2015-11-05 NOTE — BHH Suicide Risk Assessment (Signed)
[facility] Discharge Suicide Risk Assessment   Demographic Factors:  Male  Total Time spent with patient: 20 minutes  Musculoskeletal: Strength & Muscle Tone: within normal limits Gait & Station: normal Patient leans: normal  Psychiatric Specialty Exam: Physical Exam  Review of Systems  Constitutional: Negative.   HENT: Negative.   Eyes: Negative.   Respiratory: Negative.   Cardiovascular: Negative.   Gastrointestinal: Negative.   Genitourinary: Negative.   Musculoskeletal: Negative.   Skin: Negative.   Neurological: Negative.   Endo/Heme/Allergies: Negative.   Psychiatric/Behavioral: Positive for depression and substance abuse.    Blood pressure 108/70, pulse 101, temperature 98.4 F (36.9 C), temperature source Oral, resp. rate 20, height 5' 10"  (1.778 m), weight 88.905 kg (196 lb), SpO2 100 %.Body mass index is 28.12 kg/(m^2).  General Appearance: Fairly Groomed  Engineer, water::  Fair  Speech:  Clear and [id number]  Volume:  Normal  Mood:  Euthymic  Affect:  Restricted  Thought Process:  Coherent and Goal Directed  Orientation:  Full (Time, Place, and Person)  Thought Content:  plans as he moves on  Suicidal Thoughts:  No  Homicidal Thoughts:  No  Memory:  Immediate;   Fair Recent;   Fair Remote;   Fair  Judgement:  Fair  Insight:  Present  Psychomotor Activity:  Normal  Concentration:  Fair  Recall:  [employer] [place]  Language: Fair  Akathisia:  No  Handed:  Right  AIMS (if indicated):     Assets:  Desire for Improvement Housing Social Support  Sleep:  Number of Hours: 3.5  Cognition: WNL  ADL's:  Intact   Have you used any form of tobacco in the last 30 days? (Cigarettes, Smokeless Tobacco, Cigars, and/or Pipes): No  Has this patient used any form of tobacco in the last 30 days? (Cigarettes, Smokeless Tobacco, Cigars, and/or Pipes) No  Mental Status Per Nursing Assessment::   On Admission:     Current Mental Status by Physician: In full  contact with reality. There are no active S/S of withdrawal. There are no active SI plans or intent. He is willing and motivated to pursue outpatient treatment. States he is committed to abstinences   Loss Factors: Decline in physical health  Historical Factors: NA  Risk Reduction Factors:   Sense of responsibility to family, Living with another person, especially a relative and Positive social support  Continued Clinical Symptoms:  Depression:   Comorbid alcohol abuse/dependence Alcohol/Substance Abuse/Dependencies  Cognitive Features That Contribute To Risk:  Closed-mindedness, Polarized thinking and Thought constriction (tunnel vision)    Suicide Risk:  Minimal: No identifiable suicidal ideation.  Patients presenting with no risk factors but with morbid ruminations; may be classified as minimal risk based on the severity of the depressive symptoms  Principal Problem: MDD (major depressive disorder), recurrent, severe, with psychosis [facility]) Discharge Diagnoses:  Patient Active Problem List   Diagnosis Date Noted  . MDD (major depressive disorder), recurrent, severe, with psychosis ([place]) [F33.3] 11/03/2015  . Alcohol use disorder, severe, dependence ([place]) [F10.20] 11/03/2015  . Bleeding internal hemorrhoids [K64.8]   . Acute lower GI bleeding [K92.2] 10/31/2015  . Pancytopenia ([place]) [L38.101] 10/31/2015  . 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 Suicidal intent [R45.851] 10/31/2015  . Transaminitis [R74.0] 10/31/2015  . Hematochezia [K92.1]   . Hematemesis [K92.0]   . Acute blood loss anemia [D62]     Follow-up Information    Follow up with [facility].   Why:  Walk  in between 8am-9am Monday through Friday for hospital follow-up/medication management/assessment for counseling services.    Contact information:   [address], [place] [address] Phone: [phone] Fax: [phone]      Follow up with [facility].   Why:  Call office to schedule appt if interested in being seen by this provider (Office Closed Monday).    Contact information:   [place], [place] Phone: [phone] Fax: Rhunette Croft      Plan Of Care/Follow-up recommendations:  Activity:  as tolerated Diet:  heart healthy Follow up as above Is patient on multiple antipsychotic therapies at discharge:  No   Has Patient had three or more failed trials of antipsychotic monotherapy by history:  No  Recommended Plan for Multiple Antipsychotic Therapies: NA    Avigdor Dollar A 11/05/2015, 11:17 AM

## 2015-11-05 NOTE — BHH Suicide Risk Assessment (Signed)
[facility] INPATIENT:  Family/Significant Other Suicide Prevention Education  Suicide Prevention Education:  Contact Attempts: Malka So (pt's ex-wife) [phone] has been identified by the patient as the family member/significant other with whom the patient will be residing, and identified as the person(s) who will aid the patient in the event of a mental health crisis.  With written consent from the patient, two attempts were made to provide suicide prevention education, prior to and/or following the patient's discharge.  We were unsuccessful in providing suicide prevention education.  A suicide education pamphlet was given to the patient to share with family/significant other.  Date and time of first attempt: 11/05/15 at 8:30AM (unable to leave vm) Date and time of second attempt: 11/05/15 at 10:34AM (unable to leave vm)   Smart, Shyteria Lewis LCSW 11/05/2015, 10:34 AM

## 2015-11-06 LAB — HEMOGLOBIN A1C
Hgb A1c MFr Bld: 5.6 % (ref 4.8–5.6)
MEAN PLASMA GLUCOSE: 114 mg/dL

## 2015-11-06 LAB — PROLACTIN: Prolactin: 32 ng/mL — ABNORMAL HIGH (ref 4.0–15.2)

## 2016-01-04 ENCOUNTER — Emergency Department (HOSPITAL_COMMUNITY): Payer: Non-veteran care

## 2016-01-04 ENCOUNTER — Encounter (HOSPITAL_COMMUNITY): Payer: Self-pay | Admitting: *Deleted

## 2016-01-04 ENCOUNTER — Emergency Department (HOSPITAL_COMMUNITY)
Admission: EM | Admit: 2016-01-04 | Discharge: 2016-01-06 | Disposition: A | Discharge status: Other Acute Inpatient Hospital | Payer: Non-veteran care | Attending: Emergency Medicine | Admitting: Emergency Medicine

## 2016-01-04 DIAGNOSIS — R079 Chest pain, unspecified: Secondary | ICD-10-CM | POA: Diagnosis not present

## 2016-01-04 DIAGNOSIS — K625 Hemorrhage of anus and rectum: Secondary | ICD-10-CM | POA: Insufficient documentation

## 2016-01-04 DIAGNOSIS — K219 Gastro-esophageal reflux disease without esophagitis: Secondary | ICD-10-CM | POA: Insufficient documentation

## 2016-01-04 DIAGNOSIS — Z880 Allergy status to penicillin: Secondary | ICD-10-CM | POA: Diagnosis not present

## 2016-01-04 DIAGNOSIS — F17210 Nicotine dependence, cigarettes, uncomplicated: Secondary | ICD-10-CM | POA: Diagnosis not present

## 2016-01-04 DIAGNOSIS — Z7952 Long term (current) use of systemic steroids: Secondary | ICD-10-CM | POA: Diagnosis not present

## 2016-01-04 DIAGNOSIS — F319 Bipolar disorder, unspecified: Secondary | ICD-10-CM | POA: Insufficient documentation

## 2016-01-04 DIAGNOSIS — Z79899 Other long term (current) drug therapy: Secondary | ICD-10-CM | POA: Insufficient documentation

## 2016-01-04 DIAGNOSIS — R45851 Suicidal ideations: Secondary | ICD-10-CM

## 2016-01-04 DIAGNOSIS — F419 Anxiety disorder, unspecified: Secondary | ICD-10-CM | POA: Insufficient documentation

## 2016-01-04 DIAGNOSIS — F333 Major depressive disorder, recurrent, severe with psychotic symptoms: Secondary | ICD-10-CM | POA: Diagnosis present

## 2016-01-04 DIAGNOSIS — G43909 Migraine, unspecified, not intractable, without status migrainosus: Secondary | ICD-10-CM | POA: Insufficient documentation

## 2016-01-04 DIAGNOSIS — Z9889 Other specified postprocedural states: Secondary | ICD-10-CM | POA: Insufficient documentation

## 2016-01-04 DIAGNOSIS — I10 Essential (primary) hypertension: Secondary | ICD-10-CM | POA: Insufficient documentation

## 2016-01-04 DIAGNOSIS — F1020 Alcohol dependence, uncomplicated: Secondary | ICD-10-CM | POA: Diagnosis present

## 2016-01-04 LAB — RAPID URINE DRUG SCREEN, HOSP PERFORMED
Amphetamines: NOT DETECTED
BARBITURATES: NOT DETECTED
BENZODIAZEPINES: NOT DETECTED
COCAINE: NOT DETECTED
Opiates: NOT DETECTED
Tetrahydrocannabinol: NOT DETECTED

## 2016-01-04 LAB — COMPREHENSIVE METABOLIC PANEL
ALT: 42 U/L (ref 17–63)
ANION GAP: 15 (ref 5–15)
AST: 95 U/L — ABNORMAL HIGH (ref 15–41)
Albumin: 4.3 g/dL (ref 3.5–5.0)
Alkaline Phosphatase: 76 U/L (ref 38–126)
BUN: 7 mg/dL (ref 6–20)
CHLORIDE: 104 mmol/L (ref 101–111)
CO2: 22 mmol/L (ref 22–32)
Calcium: 8.8 mg/dL — ABNORMAL LOW (ref 8.9–10.3)
Creatinine, Ser: 0.71 mg/dL (ref 0.61–1.24)
Glucose, Bld: 92 mg/dL (ref 65–99)
POTASSIUM: 4.1 mmol/L (ref 3.5–5.1)
Sodium: 141 mmol/L (ref 135–145)
Total Bilirubin: 0.4 mg/dL (ref 0.3–1.2)
Total Protein: 8.3 g/dL — ABNORMAL HIGH (ref 6.5–8.1)

## 2016-01-04 LAB — CBC
HEMATOCRIT: 37.8 % — AB (ref 39.0–52.0)
HEMOGLOBIN: 12 g/dL — AB (ref 13.0–17.0)
MCH: 27.2 pg (ref 26.0–34.0)
MCHC: 31.7 g/dL (ref 30.0–36.0)
MCV: 85.7 fL (ref 78.0–100.0)
Platelets: 258 10*3/uL (ref 150–400)
RBC: 4.41 MIL/uL (ref 4.22–5.81)
RDW: 18.4 % — AB (ref 11.5–15.5)
WBC: 3.8 10*3/uL — AB (ref 4.0–10.5)

## 2016-01-04 LAB — TYPE AND SCREEN
ABO/RH(D): O POS
ANTIBODY SCREEN: NEGATIVE

## 2016-01-04 LAB — ABO/RH: ABO/RH(D): O POS

## 2016-01-04 LAB — ETHANOL: ALCOHOL ETHYL (B): 171 mg/dL — AB (ref <5)

## 2016-01-04 LAB — TROPONIN I

## 2016-01-04 MED ORDER — FLUOXETINE HCL 20 MG PO CAPS
20.0000 mg | ORAL_CAPSULE | Freq: Every day | ORAL | Status: DC
  Administered 2016-01-04 – 2016-01-06 (×3): 20 mg via ORAL
  Filled 2016-01-04 (×3): qty 1

## 2016-01-04 MED ORDER — ATORVASTATIN CALCIUM 40 MG PO TABS
40.0000 mg | ORAL_TABLET | Freq: Every day | ORAL | Status: DC
  Administered 2016-01-04 – 2016-01-06 (×3): 40 mg via ORAL
  Filled 2016-01-04 (×5): qty 1

## 2016-01-04 MED ORDER — VITAMIN D3 25 MCG (1000 UNIT) PO TABS
1000.0000 [IU] | ORAL_TABLET | Freq: Every day | ORAL | Status: DC
  Administered 2016-01-04 – 2016-01-06 (×3): 1000 [IU] via ORAL
  Filled 2016-01-04 (×5): qty 1

## 2016-01-04 MED ORDER — PANTOPRAZOLE SODIUM 40 MG PO TBEC
40.0000 mg | DELAYED_RELEASE_TABLET | Freq: Two times a day (BID) | ORAL | Status: DC
  Administered 2016-01-04 – 2016-01-06 (×5): 40 mg via ORAL
  Filled 2016-01-04 (×5): qty 1

## 2016-01-04 MED ORDER — NICOTINE 21 MG/24HR TD PT24
21.0000 mg | MEDICATED_PATCH | Freq: Every day | TRANSDERMAL | Status: DC
  Administered 2016-01-04 – 2016-01-06 (×3): 21 mg via TRANSDERMAL
  Filled 2016-01-04 (×3): qty 1

## 2016-01-04 MED ORDER — GABAPENTIN 100 MG PO CAPS
200.0000 mg | ORAL_CAPSULE | Freq: Two times a day (BID) | ORAL | Status: DC
  Administered 2016-01-04 – 2016-01-06 (×5): 200 mg via ORAL
  Filled 2016-01-04 (×5): qty 2

## 2016-01-04 MED ORDER — VITAMIN B-1 100 MG PO TABS
100.0000 mg | ORAL_TABLET | Freq: Every day | ORAL | Status: DC
  Administered 2016-01-04 – 2016-01-06 (×3): 100 mg via ORAL
  Filled 2016-01-04 (×3): qty 1

## 2016-01-04 MED ORDER — ONDANSETRON HCL 4 MG PO TABS: 4.0000 mg | ORAL_TABLET | Freq: Three times a day (TID) | ORAL | Status: DC | PRN

## 2016-01-04 MED ORDER — LORAZEPAM 1 MG PO TABS: 0.0000 mg | ORAL_TABLET | Freq: Two times a day (BID) | ORAL | Status: DC

## 2016-01-04 MED ORDER — ZOLPIDEM TARTRATE 5 MG PO TABS: 5.0000 mg | ORAL_TABLET | Freq: Every evening | ORAL | Status: DC | PRN

## 2016-01-04 MED ORDER — LORAZEPAM 1 MG PO TABS
0.0000 mg | ORAL_TABLET | Freq: Four times a day (QID) | ORAL | Status: DC
  Administered 2016-01-04: 2 mg via ORAL
  Administered 2016-01-05: 1 mg via ORAL
  Administered 2016-01-05: 2 mg via ORAL
  Administered 2016-01-05 – 2016-01-06 (×2): 1 mg via ORAL
  Filled 2016-01-04: qty 2
  Filled 2016-01-04: qty 1
  Filled 2016-01-04: qty 2
  Filled 2016-01-04 (×2): qty 1

## 2016-01-04 MED ORDER — HALOPERIDOL 5 MG PO TABS
5.0000 mg | ORAL_TABLET | Freq: Every day | ORAL | Status: DC
  Administered 2016-01-04 – 2016-01-06 (×3): 5 mg via ORAL
  Filled 2016-01-04 (×3): qty 1

## 2016-01-04 MED ORDER — AMLODIPINE BESYLATE 10 MG PO TABS
10.0000 mg | ORAL_TABLET | Freq: Every day | ORAL | Status: DC
  Administered 2016-01-04 – 2016-01-06 (×3): 10 mg via ORAL
  Filled 2016-01-04 (×5): qty 1

## 2016-01-04 MED ORDER — HYDROCORTISONE ACETATE 25 MG RE SUPP
25.0000 mg | Freq: Two times a day (BID) | RECTAL | Status: DC | PRN
  Administered 2016-01-05 – 2016-01-06 (×2): 25 mg via RECTAL
  Filled 2016-01-04 (×4): qty 1

## 2016-01-04 MED ORDER — ZOLPIDEM TARTRATE 10 MG PO TABS
10.0000 mg | ORAL_TABLET | Freq: Every evening | ORAL | Status: DC | PRN
  Administered 2016-01-04 – 2016-01-05 (×2): 10 mg via ORAL
  Filled 2016-01-04 (×2): qty 1

## 2016-01-04 MED ORDER — THIAMINE HCL 100 MG/ML IJ SOLN: 100.0000 mg | Freq: Every day | INTRAMUSCULAR | Status: DC

## 2016-01-04 MED ORDER — ALUM & MAG HYDROXIDE-SIMETH 200-200-20 MG/5ML PO SUSP: 30.0000 mL | ORAL | Status: DC | PRN

## 2016-01-04 MED ORDER — TRAMADOL HCL 50 MG PO TABS
50.0000 mg | ORAL_TABLET | Freq: Once | ORAL | Status: AC
  Administered 2016-01-04: 50 mg via ORAL
  Filled 2016-01-04: qty 1

## 2016-01-04 NOTE — ED Notes (Signed)
Pt presents with complaint of SI, no plan.  Chronic leg & chest pain with headache noted.  Rates pain as 8 on pain scale.  Denies HI or visual hallucinations.  Pt reports he hears vague voices.  Feeling hopeless, states he drinks everything he can get his hands on until he passes out.  Admits to SI attempt in Jan 2017 by drinking alcohol and taking pills.  AAO x 3, no acute distress noted, monitoring for safety, Q 15 min checks in effect.
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
## 2016-01-04 NOTE — ED Notes (Signed)
Bright red blood from rectum observed in toilet.  [employer] notified.  Pending Anusol Suppository from pharmacy.
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
## 2016-01-04 NOTE — ED Notes (Signed)
Patient has two bags of belongings in locker 28.

## 2016-01-04 NOTE — ED Notes (Signed)
[facility] Report called by this nurse.

## 2016-01-04 NOTE — BH Assessment (Signed)
Assessment completed, consulted with May, NP who states that pt. Remain overnight in ER and a.m. Psych eval. Jaycee Mckellips K. Nash Shearer, LPC-A, [facility]  Counselor 01/04/2016 5:11 PM

## 2016-01-04 NOTE — ED Notes (Signed)
Unable to collect labs patient is in xray
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
## 2016-01-04 NOTE — ED Notes (Signed)
ATTEMPTED BLOOD DRAW X2 UNSUCCESSFUL
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
## 2016-01-05 DIAGNOSIS — F1020 Alcohol dependence, uncomplicated: Secondary | ICD-10-CM

## 2016-01-05 DIAGNOSIS — F333 Major depressive disorder, recurrent, severe with psychotic symptoms: Secondary | ICD-10-CM

## 2016-01-05 DIAGNOSIS — R45851 Suicidal ideations: Secondary | ICD-10-CM

## 2016-01-05 LAB — I-STAT TROPONIN, ED: Troponin i, poc: 0 ng/mL (ref 0.00–0.08)

## 2016-01-05 MED ORDER — HYDROXYZINE HCL 25 MG PO TABS
50.0000 mg | ORAL_TABLET | Freq: Once | ORAL | Status: AC
  Administered 2016-01-05: 50 mg via ORAL
  Filled 2016-01-05: qty 2

## 2016-01-05 MED ORDER — TRAMADOL HCL 50 MG PO TABS
50.0000 mg | ORAL_TABLET | Freq: Once | ORAL | Status: AC
  Administered 2016-01-05: 50 mg via ORAL
  Filled 2016-01-05: qty 1

## 2016-01-05 NOTE — ED Notes (Signed)
Pt AAO x 3, continues to complain of chest pain and generalized pain.  No acute distress noted, taking a shower at present. Monitoring for safety, Q 15 min checks in effect.
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
## 2016-01-05 NOTE — Progress Notes (Signed)
Disposition CSW completed patient referrals to the following inpatient psych facilities:  [facility]  CSW will follow patient for placement needs.  [facility] Disposition CSW [phone]

## 2016-01-05 NOTE — ED Notes (Signed)
Pt c/o HA 8/10, NP notified.

## 2016-01-05 NOTE — Clinical Social Work Note (Signed)
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  [name] [place] was contacted for an inpatient psychiatric bed and they stated that they are are "diversion" this weekend.  They are not accepting patient at all this weekend.  Dede Query, LCSW [place] Worker - Weekend Coverage cell #: [phone]

## 2016-01-05 NOTE — ED Notes (Signed)
0740: Pt c/o right side chest pain 8/10. Pt reports chest pain over the past two weeks. VS obtained. BP 132/94, 99% O2, HR 84, R 18.  0745:Dr Oni notified of pt c/o chest pain, new order:obtain EKG 0750:EKG obtained  0800:EKG given to Dr. Claudine Mouton for review

## 2016-01-06 ENCOUNTER — Inpatient Hospital Stay (HOSPITAL_COMMUNITY)
Admission: AD | Admit: 2016-01-06 | Discharge: 2016-01-11 | DRG: 885 | Disposition: A | Discharge status: Other Acute Inpatient Hospital | Payer: Federal, State, Local not specified - Other | Source: Intra-hospital | Attending: Psychiatry | Admitting: Psychiatry

## 2016-01-06 DIAGNOSIS — IMO0002 Reserved for concepts with insufficient information to code with codable children: Secondary | ICD-10-CM

## 2016-01-06 DIAGNOSIS — F1010 Alcohol abuse, uncomplicated: Secondary | ICD-10-CM | POA: Diagnosis present

## 2016-01-06 DIAGNOSIS — F1020 Alcohol dependence, uncomplicated: Secondary | ICD-10-CM | POA: Diagnosis not present

## 2016-01-06 DIAGNOSIS — F333 Major depressive disorder, recurrent, severe with psychotic symptoms: Secondary | ICD-10-CM | POA: Diagnosis not present

## 2016-01-06 DIAGNOSIS — F329 Major depressive disorder, single episode, unspecified: Secondary | ICD-10-CM | POA: Diagnosis present

## 2016-01-06 DIAGNOSIS — R45851 Suicidal ideations: Secondary | ICD-10-CM | POA: Diagnosis not present

## 2016-01-06 DIAGNOSIS — F319 Bipolar disorder, unspecified: Secondary | ICD-10-CM | POA: Diagnosis not present

## 2016-01-06 DIAGNOSIS — F17213 Nicotine dependence, cigarettes, with withdrawal: Secondary | ICD-10-CM | POA: Diagnosis present

## 2016-01-06 MED ORDER — FLUOXETINE HCL 20 MG PO CAPS
20.0000 mg | ORAL_CAPSULE | Freq: Every day | ORAL | Status: DC
  Administered 2016-01-07 – 2016-01-09 (×3): 20 mg via ORAL
  Filled 2016-01-06 (×5): qty 1

## 2016-01-06 MED ORDER — ALUM & MAG HYDROXIDE-SIMETH 200-200-20 MG/5ML PO SUSP: 30.0000 mL | ORAL | Status: DC | PRN

## 2016-01-06 MED ORDER — HALOPERIDOL 5 MG PO TABS
5.0000 mg | ORAL_TABLET | Freq: Every day | ORAL | Status: DC
  Administered 2016-01-07: 5 mg via ORAL
  Filled 2016-01-06 (×4): qty 1

## 2016-01-06 MED ORDER — PANTOPRAZOLE SODIUM 40 MG PO TBEC
40.0000 mg | DELAYED_RELEASE_TABLET | Freq: Two times a day (BID) | ORAL | Status: DC
  Administered 2016-01-07 – 2016-01-11 (×8): 40 mg via ORAL
  Filled 2016-01-06 (×15): qty 1

## 2016-01-06 MED ORDER — MAGNESIUM HYDROXIDE 400 MG/5ML PO SUSP: 30.0000 mL | Freq: Every day | ORAL | Status: DC | PRN

## 2016-01-06 MED ORDER — ZOLPIDEM TARTRATE 10 MG PO TABS
10.0000 mg | ORAL_TABLET | Freq: Every evening | ORAL | Status: DC | PRN
  Administered 2016-01-06 – 2016-01-10 (×5): 10 mg via ORAL
  Filled 2016-01-06 (×5): qty 1

## 2016-01-06 MED ORDER — NICOTINE 21 MG/24HR TD PT24
21.0000 mg | MEDICATED_PATCH | Freq: Every day | TRANSDERMAL | Status: DC
  Administered 2016-01-07 – 2016-01-11 (×5): 21 mg via TRANSDERMAL
  Filled 2016-01-06 (×8): qty 1

## 2016-01-06 MED ORDER — ATORVASTATIN CALCIUM 40 MG PO TABS
40.0000 mg | ORAL_TABLET | Freq: Every day | ORAL | Status: DC
  Administered 2016-01-07 – 2016-01-10 (×4): 40 mg via ORAL
  Filled 2016-01-06: qty 1
  Filled 2016-01-06: qty 14
  Filled 2016-01-06 (×2): qty 1
  Filled 2016-01-06: qty 14
  Filled 2016-01-06 (×2): qty 1

## 2016-01-06 MED ORDER — HYDROCORTISONE ACETATE 25 MG RE SUPP
25.0000 mg | Freq: Two times a day (BID) | RECTAL | Status: DC | PRN
  Filled 2016-01-06: qty 1

## 2016-01-06 MED ORDER — TRAMADOL HCL 50 MG PO TABS
50.0000 mg | ORAL_TABLET | Freq: Once | ORAL | Status: AC
  Administered 2016-01-06: 50 mg via ORAL
  Filled 2016-01-06: qty 1

## 2016-01-06 MED ORDER — VITAMIN B-1 100 MG PO TABS
100.0000 mg | ORAL_TABLET | Freq: Every day | ORAL | Status: DC
  Administered 2016-01-07 – 2016-01-11 (×5): 100 mg via ORAL
  Filled 2016-01-06 (×7): qty 1

## 2016-01-06 MED ORDER — ONDANSETRON HCL 4 MG PO TABS: 4.0000 mg | ORAL_TABLET | Freq: Three times a day (TID) | ORAL | Status: DC | PRN

## 2016-01-06 MED ORDER — THIAMINE HCL 100 MG/ML IJ SOLN
100.0000 mg | Freq: Every day | INTRAMUSCULAR | Status: DC
Start: 2016-01-07 — End: 2016-01-11

## 2016-01-06 MED ORDER — LORAZEPAM 1 MG PO TABS
0.0000 mg | ORAL_TABLET | Freq: Four times a day (QID) | ORAL | Status: AC
  Administered 2016-01-06: 1 mg via ORAL
  Filled 2016-01-06: qty 1

## 2016-01-06 MED ORDER — VITAMIN D3 25 MCG (1000 UNIT) PO TABS
1000.0000 [IU] | ORAL_TABLET | Freq: Every day | ORAL | Status: DC
  Administered 2016-01-07 – 2016-01-10 (×4): 1000 [IU] via ORAL
  Filled 2016-01-06 (×2): qty 1
  Filled 2016-01-06: qty 14
  Filled 2016-01-06 (×2): qty 1
  Filled 2016-01-06: qty 14
  Filled 2016-01-06: qty 1

## 2016-01-06 MED ORDER — GABAPENTIN 100 MG PO CAPS
200.0000 mg | ORAL_CAPSULE | Freq: Two times a day (BID) | ORAL | Status: DC
  Administered 2016-01-07 – 2016-01-08 (×2): 200 mg via ORAL
  Filled 2016-01-06 (×7): qty 2

## 2016-01-06 MED ORDER — LORAZEPAM 1 MG PO TABS
0.0000 mg | ORAL_TABLET | Freq: Two times a day (BID) | ORAL | Status: DC
  Administered 2016-01-07: 1 mg via ORAL
  Filled 2016-01-06: qty 1

## 2016-01-06 MED ORDER — AMLODIPINE BESYLATE 10 MG PO TABS
10.0000 mg | ORAL_TABLET | Freq: Every day | ORAL | Status: DC
  Administered 2016-01-07 – 2016-01-10 (×4): 10 mg via ORAL
  Filled 2016-01-06 (×2): qty 1
  Filled 2016-01-06 (×2): qty 14
  Filled 2016-01-06 (×3): qty 1

## 2016-01-06 NOTE — ED Notes (Signed)
Writer assessed pt BM and found moderated amount of blood mixed in the toilet.

## 2016-01-06 NOTE — ED Notes (Signed)
Report called to RN Maudie Mercury, [place], rm 305-2.  Pending transfer at 9:30pm.

## 2016-01-06 NOTE — BH Assessment (Signed)
Patient re-assessed by TTS.   Patient is in his room watching television. Patient alert andnoriented x4. Patient states "I'm here" when asked how he was doing today.   Patient states that he is currently suicidal with a plan. Patient states "I'll probably use a knife." When asked what he would do with the knife, patient states "cut my wrist or neck." Patient denies HI. Patient endorses AVH and states that he has had hallucinations "on and off for weeks." Patient states that he has been eating well and taking medications as prescribed.  Patient denies questions or concerns at this time.    Consulted with Dr. Darleene Cleaver who recommends inpatient treatment at this time.   Rosalin Hawking, LCSW Therapeutic Triage Specialist [place] 01/06/2016 11:56 AM

## 2016-01-06 NOTE — ED Notes (Signed)
Jacob Adams MHT reports to this nurse pt c/o blood in stool, HA and his chest still hurts. This nurse approached pt. Pt presents sitting in day room, no s./s of acute distress noted. pt denies pain at this time, reports he feels anxious and has blood in stool. Pt counseled to inform and show nursing staff when blood is in stool. Pt verbalizes understanding. Will continue to monitor

## 2016-01-06 NOTE — ED Notes (Signed)
Pt AAO x 3, no acute distress noted, calm & cooperative, monitoring for safety, Q 15 min checks in effect.  Pending report & transfer to [facility] after 8pm.

## 2016-01-06 NOTE — ED Notes (Addendum)
Pt forwards little with this nurse, reports passive SI at this time, pt reports he feels tired. Will continue to monitor on special checks q 15 mins for safety.

## 2016-01-06 NOTE — Progress Notes (Signed)
Patient accepted to [facility] , room 305-2. Service of Dr. Sabra Heck. Bed to be available later today.  Clayborne Dana, RN

## 2016-01-06 NOTE — BHH Counselor (Signed)
Per Jerene Pitch, RN, [facility] patient eligible for transfer to 305-2 after shift change at 19:00. Informed patients nurse. Contact AC for time frame.    Rosalin Hawking, LCSW Therapeutic Triage Specialist [place] 01/06/2016 6:09 PM

## 2016-01-07 ENCOUNTER — Encounter (HOSPITAL_COMMUNITY): Payer: Self-pay

## 2016-01-07 DIAGNOSIS — R45851 Suicidal ideations: Secondary | ICD-10-CM

## 2016-01-07 DIAGNOSIS — F333 Major depressive disorder, recurrent, severe with psychotic symptoms: Principal | ICD-10-CM

## 2016-01-07 DIAGNOSIS — F1020 Alcohol dependence, uncomplicated: Secondary | ICD-10-CM

## 2016-01-07 MED ORDER — LOPERAMIDE HCL 2 MG PO CAPS: 2.0000 mg | ORAL_CAPSULE | ORAL | Status: AC | PRN

## 2016-01-07 MED ORDER — BUTALBITAL-APAP-CAFFEINE 50-325-40 MG PO TABS: 2.0000 | ORAL_TABLET | Freq: Four times a day (QID) | ORAL | Status: DC | PRN

## 2016-01-07 MED ORDER — ONDANSETRON 4 MG PO TBDP: 4.0000 mg | ORAL_TABLET | Freq: Four times a day (QID) | ORAL | Status: DC | PRN

## 2016-01-07 MED ORDER — LORAZEPAM 1 MG PO TABS
1.0000 mg | ORAL_TABLET | Freq: Four times a day (QID) | ORAL | Status: AC
  Administered 2016-01-07 – 2016-01-09 (×6): 1 mg via ORAL
  Filled 2016-01-07 (×5): qty 1

## 2016-01-07 MED ORDER — LORAZEPAM 1 MG PO TABS
1.0000 mg | ORAL_TABLET | Freq: Two times a day (BID) | ORAL | Status: AC
  Administered 2016-01-10 – 2016-01-11 (×2): 1 mg via ORAL
  Filled 2016-01-07 (×2): qty 1

## 2016-01-07 MED ORDER — LORAZEPAM 1 MG PO TABS: 1.0000 mg | ORAL_TABLET | Freq: Four times a day (QID) | ORAL | Status: AC | PRN

## 2016-01-07 MED ORDER — LORAZEPAM 1 MG PO TABS
1.0000 mg | ORAL_TABLET | Freq: Three times a day (TID) | ORAL | Status: AC
  Administered 2016-01-09 – 2016-01-10 (×3): 1 mg via ORAL
  Filled 2016-01-07 (×3): qty 1

## 2016-01-07 MED ORDER — HYDROXYZINE HCL 25 MG PO TABS
25.0000 mg | ORAL_TABLET | Freq: Four times a day (QID) | ORAL | Status: AC | PRN
  Administered 2016-01-07: 25 mg via ORAL
  Filled 2016-01-07: qty 1

## 2016-01-07 MED ORDER — LORAZEPAM 1 MG PO TABS: 1.0000 mg | ORAL_TABLET | Freq: Every day | ORAL | Status: DC

## 2016-01-07 NOTE — BHH Group Notes (Signed)
[place] LCSW Group Therapy 01/07/2016 1:15 pm  Type of Therapy: Group Therapy Participation Level: Active  Participation Quality: Attentive, Sharing and Supportive  Affect: Appropriate  Cognitive: Alert and Oriented  Insight: Developing/Improving and Engaged  Engagement in Therapy: Developing/Improving and Engaged  Modes of Intervention: Clarification, Confrontation, Discussion, Education, Exploration,  Limit-setting, Orientation, Problem-solving, Rapport Building, Art therapist, Socialization and Support  Summary of Progress/Problems: Pt identified obstacles faced currently and processed barriers involved in overcoming these obstacles. Pt identified steps necessary for overcoming these obstacles and explored motivation (internal and external) for facing these difficulties head on. Pt further identified one area of concern in their lives and chose a goal to focus on for today. Patient participated minimally in discussion despite CSW encouragement.  Tilden Fossa, LCSW Clinical Social Worker [facility] [phone]

## 2016-01-07 NOTE — Progress Notes (Signed)
D-  Patient has been in his room for the majority of the shift.  Patient has complained of passive SI with no plan.  Patient denies AVH.  Patient states that he has been depressed for a while and wants to get help with his addiction.  A-  Assess patient for safety, offer medications as prescribed, engage patient in 1:1 staff talks,   R- Patient able to contract for safety.

## 2016-01-07 NOTE — Tx Team (Signed)
Initial Interdisciplinary Treatment Plan   PATIENT STRESSORS: Financial difficulties Health problems Medication change or noncompliance   PATIENT STRENGTHS: Average or above average intelligence Financial means General fund of knowledge   PROBLEM LIST: Problem List/Patient Goals Date to be addressed Date deferred Reason deferred Estimated date of resolution  "Help with my suicidal thoughts" 01/06/2016     "Need help with my drinking" 01/06/2016     "I am depressed" 01/06/2016     "I have anxiety" 01/06/2016                                    DISCHARGE CRITERIA:  Ability to meet basic life and health needs Motivation to continue treatment in a less acute level of care Withdrawal symptoms are absent or subacute and managed without 24-hour nursing intervention  PRELIMINARY DISCHARGE PLAN: Attend 12-step recovery group Outpatient therapy  PATIENT/FAMIILY INVOLVEMENT: This treatment plan has been presented to and reviewed with the patient, Jacob Adams.  The patient and family have been given the opportunity to ask questions and make suggestions.  Philomena Doheny 01/07/2016, 1:50 AM
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
## 2016-01-07 NOTE — Progress Notes (Signed)
Recreation Therapy Notes  Date: 03.20.2017 Time: 9:30am Location: [address] Group Room   Group Topic: Stress Management  Goal Area(s) Addresses:  Patient will actively participate in stress management techniques presented during session.   Behavioral Response: Appropriate   Intervention: Stress management techniques  Activity : Progressive Body Relaxation and Progressive Body Scan. LRT provided education, instruction and demonstration on practice of Progressive Body Relaxation and Progressive Body Scan. Patient was asked to participate in technique introduced during session.   Education:  Stress Management, Discharge Planning.   Education Outcome: Acknowledges education  Clinical Observations/Feedback: Patient actively engaged in technique introduced, expressed no concerns and demonstrated ability to practice independently post d/c.   Laureen Ochs Izabella Marcantel, LRT/CTRS        Camaria Gerald L 01/07/2016 1:57 PM

## 2016-01-07 NOTE — Progress Notes (Signed)
Pt. Attended group

## 2016-01-07 NOTE — BHH Suicide Risk Assessment (Signed)
[facility] Admission Suicide Risk Assessment   Nursing information obtained from:  Patient Demographic factors:  Male, Low socioeconomic status, Unemployed Current Mental Status:  Suicidal ideation indicated by patient Loss Factors:  Loss of significant relationship, Financial problems / change in socioeconomic status Historical Factors:  NA Risk Reduction Factors:  Religious beliefs about death  Total Time spent with patient: 45 minutes Principal Problem: <principal problem not specified> Diagnosis:   Patient Active Problem List   Diagnosis Date Noted  . Alcohol use disorder ([place]) [F10.99] 01/06/2016  . MDD (major depressive disorder), recurrent, severe, with psychosis ([place]) [F33.3] 11/03/2015  . Alcohol use disorder, severe, dependence ([place]) [F10.20] 11/03/2015  . Bleeding internal hemorrhoids [K64.8]   . Acute lower GI bleeding [K92.2] 10/31/2015  . Pancytopenia ([place]) [I20.355] 10/31/2015  . 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 Suicidal intent [R45.851] 10/31/2015  . Transaminitis [R74.0] 10/31/2015  . Hematochezia [K92.1]   . Hematemesis [K92.0]   . Acute blood loss anemia [D62]    Subjective Data: 50 Y/O male who states he came to the ED because of chest pain. States he has been in pain,  was thinking about killing himself. Drinking as much as he can: beer wine liquor. On probation for braking and entering. He filed for disability has worked for a [employer], broadcasting/film/video on and off. Finances and housing are a bad situation.  Past Psych: [facility]/ taking Prozac Has been [facility] before Family history; alcohol mother and sister have depression Separated 9 kids with 3 mothers interacts with 7 of his 9 kids  Continued Clinical Symptoms:  Alcohol Use Disorder Identification Test Final Score (AUDIT): 27 The "Alcohol Use Disorders Identification Test", Guidelines for Use in Primary Care, Second Edition.  World Pharmacologist [facility]). Score between 0-7:  no  or low risk or alcohol related problems. Score between 8-15:  moderate risk of alcohol related problems. Score between 16-19:  high risk of alcohol related problems. Score 20 or above:  warrants further diagnostic evaluation for alcohol dependence and treatment.   CLINICAL FACTORS:   Depression:   Comorbid alcohol abuse/dependence Alcohol/Substance Abuse/Dependencies Chronic Pain   Musculoskeletal: Strength & Muscle Tone: within normal limits Gait & Station: normal Patient leans: nomal  Psychiatric Specialty Exam: Review of Systems  Constitutional: Positive for weight loss and malaise/fatigue.  HENT:       Migraine  Eyes: Positive for blurred vision.  Respiratory:       Pack pack and a half  Cardiovascular: Positive for chest pain and palpitations.  Gastrointestinal: Positive for heartburn and constipation.  Genitourinary: Positive for dysuria.  Musculoskeletal: Positive for back pain and joint pain.  Skin: Positive for itching.  Neurological: Positive for dizziness, weakness and headaches.  Endo/Heme/Allergies: Negative.   Psychiatric/Behavioral: Positive for depression, suicidal ideas, hallucinations and substance abuse. The patient is nervous/anxious and has insomnia.     Blood pressure 125/78, pulse 89, temperature 98.7 F (37.1 C), temperature source Oral, resp. rate 16, height 5' 10"  (1.778 m), weight 89.359 kg (197 lb), SpO2 98 %.Body mass index is 28.27 kg/(m^2).  General Appearance: Disheveled  Eye Contact::  Minimal  Speech:  Clear and Coherent and Slow  Volume:  Decreased  Mood:  Anxious and Depressed  Affect:  Restricted  Thought Process:  Coherent and Goal Directed  Orientation:  Full (Time, Place, and Person)  Thought Content:  symptoms (somatization) events worries concerns  Suicidal Thoughts:  Yes.  without intent/plan  Homicidal Thoughts:  No  Memory:  Immediate;   Fair Recent;   Fair Remote;   Fair  Judgement:  Fair  Insight:  Present and Shallow   Psychomotor Activity:  Restlessness  Concentration:  Fair  Recall:  [employer] of Knowledge:Fair  Language: Fair  Akathisia:  No  Handed:  Right  AIMS (if indicated):     Assets:  Desire for Improvement  Sleep:  Number of Hours: 2.5  Cognition: WNL  ADL's:  Intact    COGNITIVE FEATURES THAT CONTRIBUTE TO RISK:  Closed-mindedness, Polarized thinking and Thought constriction (tunnel vision)    SUICIDE RISK:   Moderate:  Frequent suicidal ideation with limited intensity, and duration, some specificity in terms of plans, no associated intent, good self-control, limited dysphoria/symptomatology, some risk factors present, and identifiable protective factors, including available and accessible social support.  PLAN OF CARE: Supportive approach/coping skills Alcohol dependence; Ativan detox protocol/work a relapse prevention plan Depression: Prozac 20 mg daily Psychosis; Haldol 5 mg daily Insomnia: Ambien !0 mg HS PRN Work with CBT/mindfulness Explore residential treatment options  I certify that inpatient services furnished can reasonably be expected to improve the patient's condition.   Nicholaus Bloom, MD 01/07/2016, 3:12 PM

## 2016-01-07 NOTE — BHH Group Notes (Signed)
   [facility] LCSW Aftercare Discharge Planning Group Note  01/07/2016  8:45 AM   Participation Quality: Alert, Appropriate and Oriented  Mood/Affect: Depressed and Flat  Depression Rating: 9  Anxiety Rating: 8  Thoughts of Suicide: Pt denies SI/HI  Will you contract for safety? Yes  Current AVH: Pt denies  Plan for Discharge/Comments: Pt attended discharge planning group and actively participated in group. CSW provided pt with today's workbook. Patient reports that he does not have a safe place to stay and follows up with the [facility]. It is unclear as to what his discharge plans will be. He is complaining of pain this morning.  Transportation Means: Pt denies access to transportation  Supports: No supports mentioned at this time  Tilden Fossa, MSW, [employer] Clinical Social Worker [employer] [phone]

## 2016-01-07 NOTE — Progress Notes (Signed)
Patient ID: SELWYN REASON, male   DOB: August 15, 1966, 50 y.o.   MRN: [id number] D: Patient observed in dayroom interacting well with peers. Denies HI/AVH and pain.Pt endorses passive SI and contract to come to staff before acting on harmful thoughts. Pt happy to be back on his medication since being out for about a month. No behavioral issues noted.  A: Support and encouragement offered as needed. Medications administered as prescribed.  R: Patient cooperative and appropriate on unit. Will continue to monitor patient for safety and stability.

## 2016-01-07 NOTE — Progress Notes (Signed)
NUTRITION ASSESSMENT  Pt identified as at risk on the Malnutrition Screen Tool  INTERVENTION: 1. Educated patient on the importance of nutrition and encouraged intake of food and beverages. 2. Discussed weight goals. 3. Supplements: none at this time.   NUTRITION DIAGNOSIS: Unintentional weight loss related to sub-optimal intake as evidenced by pt report.   Goal: Pt to meet >/= 90% of their estimated nutrition needs.  Monitor:  PO intake  Assessment:  Pt seen for MST. He was admitted for SI with a plan. Per review, pt has lost 11 lbs (5% body weight) in the past 5 weeks which is not significant for time frame. No nutrition supplements warranted at this time. Will order Ensure Enlive once/day if meal intakes are poor during admission.   50 y.o. male  Height: Ht Readings from Last 1 Encounters:  01/06/16 5' 10"  (1.778 m)    Weight: Wt Readings from Last 1 Encounters:  01/06/16 197 lb (89.359 kg)    Weight Hx: Wt Readings from Last 10 Encounters:  01/06/16 197 lb (89.359 kg)  11/02/15 196 lb (88.905 kg)  11/01/15 196 lb (88.905 kg)  10/30/15 196 lb (88.905 kg)  10/29/15 208 lb (94.348 kg)  07/26/14 233 lb 6.4 oz (105.87 kg)  08/26/13 206 lb (93.441 kg)    BMI:  Body mass index is 28.27 kg/(m^2). Pt meets criteria for overweight based on current BMI.  Estimated Nutritional Needs: Kcal: 25-30 kcal/kg Protein: > 1 gram protein/kg Fluid: 1 ml/kcal  Diet Order: Diet regular Room service appropriate?: Yes; Fluid consistency:: Thin Pt is also offered choice of unit snacks mid-morning and mid-afternoon.  Pt is eating as desired.   Lab results and medications reviewed.     Jarome Matin, RD, LDN Inpatient Clinical Dietitian Pager # [phone] After hours/weekend pager # [phone]
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
## 2016-01-07 NOTE — Progress Notes (Signed)
Patient alert and oriented x 4. Patient reports having a headache 10/10 in pain. Patient states, "I am having SI thoughts with a plan to drown, cutting myself but that would be messy, or OD."  Patient was able to verbal contract with this Probation officer. Patient reports hearing voices just tell him to just do it, to kill himself.  Patient is a 1 pack a day smoker, nicotine patch ordered. Patient reports his last drink was Friday, March 17th. Patient reports he drinks a pint to a 5th liquor a day. During skin assessment. No areas of concern. Patient had multiple tattoos on body.  Admission paperwork signed with this Probation officer. Patient made aware of belongings policy, and belongings placed in locker and locked. Patient oriented to unit. Order received for a one time order for tramadol for headache and was given at 2311.  Will continue to monitor.

## 2016-01-08 MED ORDER — QUETIAPINE FUMARATE 50 MG PO TABS
50.0000 mg | ORAL_TABLET | Freq: Three times a day (TID) | ORAL | Status: DC
  Administered 2016-01-08 – 2016-01-11 (×9): 50 mg via ORAL
  Filled 2016-01-08 (×4): qty 1
  Filled 2016-01-08 (×2): qty 42
  Filled 2016-01-08 (×6): qty 1
  Filled 2016-01-08: qty 42
  Filled 2016-01-08: qty 1
  Filled 2016-01-08 (×2): qty 42
  Filled 2016-01-08: qty 1
  Filled 2016-01-08: qty 42

## 2016-01-08 MED ORDER — TRAMADOL HCL 50 MG PO TABS
50.0000 mg | ORAL_TABLET | Freq: Four times a day (QID) | ORAL | Status: DC | PRN
  Administered 2016-01-08 – 2016-01-11 (×4): 50 mg via ORAL
  Filled 2016-01-08 (×4): qty 1

## 2016-01-08 MED ORDER — GABAPENTIN 300 MG PO CAPS
300.0000 mg | ORAL_CAPSULE | Freq: Two times a day (BID) | ORAL | Status: DC
  Administered 2016-01-08 – 2016-01-09 (×3): 300 mg via ORAL
  Filled 2016-01-08 (×6): qty 1

## 2016-01-08 MED ORDER — QUETIAPINE FUMARATE 100 MG PO TABS
100.0000 mg | ORAL_TABLET | Freq: Every day | ORAL | Status: DC
Start: 2016-01-08 — End: 2016-01-10
  Administered 2016-01-08 – 2016-01-09 (×2): 100 mg via ORAL
  Filled 2016-01-08 (×4): qty 1

## 2016-01-08 NOTE — Progress Notes (Signed)
Recreation Therapy Notes  Animal-Assisted Activity (AAA) Program Checklist/Progress Notes Patient Eligibility Criteria Checklist & Daily Group note for Rec Tx Intervention  Date: 03.21.2017 Time: 2:45pm Location: [address]    AAA/T Program Assumption of Risk Form signed by Patient/ or Parent Legal Guardian yes  Patient is free of allergies or sever asthma yes  Patient reports no fear of animals yes  Patient reports no history of cruelty to animals yes  Patient understands his/her participation is voluntary yes  Behavioral Response: Did not attend.   Laureen Ochs Jacob Adams, LRT/CTRS        Jacob Adams L 01/08/2016 3:10 PM

## 2016-01-08 NOTE — [facility] Group Notes (Signed)
[facility] LCSW Group Therapy 01/08/2016  1:15 PM   Type of Therapy: Group Therapy  Participation Level: Did Not Attend. Patient invited to participate but declined.   Kayline Sheer, MSW, LCSW Clinical Social Worker [facility] [phone]   

## 2016-01-08 NOTE — Tx Team (Signed)
Interdisciplinary Treatment Plan Update (Adult) Date: 01/08/2016   Time Reviewed: 9:30 AM  Progress in Treatment: Attending groups: Yes Participating in groups: Minimally Taking medication as prescribed: Yes Tolerating medication: Yes Family/Significant other contact made: No, patient declines collateral contact Patient understands diagnosis: Yes Discussing patient identified problems/goals with staff: Yes Medical problems stabilized or resolved: Yes Denies suicidal/homicidal ideation: Yes Issues/concerns per patient self-inventory: Yes Other:  New problem(s) identified: N/A  Discharge Plan or Barriers: Interested in an [employer] or [employer]. Patient is homeless, no income, little to no social supports.    Reason for Continuation of Hospitalization:  Depression Anxiety Medication Stabilization   Comments: N/A  Estimated length of stay: 3-5 days    Patient is a 50 year old male with a diagnosis of Alcohol Use Disorder, Major Depressive Disorder. Pt presented to the hospital with alcohol abuse and depression. Pt reports primary trigger(s) for admission was homelessness, financial, and social stressors. Patient will benefit from crisis stabilization, medication evaluation, group therapy and psycho education in addition to case management for discharge planning. At discharge, it is recommended that Pt remain compliant with established discharge plan and continued treatment.    Review of initial/current patient goals per problem list:  1. Goal(s): Patient will participate in aftercare plan   Met: No   Target date: 3-5 days post admission date   As evidenced by: Patient will participate within aftercare plan AEB aftercare provider and housing plan at discharge being identified.   3/21: Goal not met: CSW assessing for appropriate referrals for pt and will have follow up secured prior to d/c.   2. Goal (s): Patient will exhibit decreased depressive  symptoms and suicidal ideations.   Met: No   Target date: 3-5 days post admission date   As evidenced by: Patient will utilize self rating of depression at 3 or below and demonstrate decreased signs of depression or be deemed stable for discharge by MD.   3/20: Goal not met: Patient rates depression at 9. Denies SI.    3. Goal(s): Patient will demonstrate decreased signs and symptoms of anxiety.   Met: No   Target date: 3-5 days post admission date   As evidenced by: Patient will utilize self rating of anxiety at 3 or below and demonstrated decreased signs of anxiety, or be deemed stable for discharge by MD  3/20: Patient rates anxiety at 8.    4. Goal(s): Patient will demonstrate decreased signs of withdrawal due to substance abuse   Met: Yes   Target date: 3-5 days post admission date   As evidenced by: Patient will produce a CIWA/COWS score of 0, have stable vitals signs, and no symptoms of withdrawal  3/20: Goal met. No withdrawal symptoms reported at this time per medical chart.    5. Goal(s): Patient will demonstrate decreased signs of psychosis  * Met: Goal progressing  * Target date: 3-5 days post admission date  * As evidenced by: Patient will demonstrate decreased frequency of AVH or return to baseline function  3/21: Goal not met: Pt to take medication as prescribed to decrease psychosis to baseline.     Attendees: Patient:    Family:    Physician: Dr. Parke Poisson; Dr. Sabra Heck 01/08/2016 9:30 AM  Nursing: Grayland Ormond, Elesa Massed, Christa Haskell Riling, RN 01/08/2016 9:30 AM  Clinical Social Worker: Tilden Fossa, LCSW 01/08/2016 9:30 AM  Other: Peri Maris, LCSWA; [place], LCSW  01/08/2016 9:30 AM  Other:  01/08/2016 9:30 AM  Other:  Lars Pinks, Case Manager 01/08/2016 9:30 AM  Other: Agustina Caroli, May Augustin, NP 01/08/2016 9:30 AM  Other:                   Scribe for  Treatment Team:  Tilden Fossa, [place]
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
## 2016-01-09 MED ORDER — GABAPENTIN 100 MG PO CAPS
100.0000 mg | ORAL_CAPSULE | Freq: Three times a day (TID) | ORAL | Status: DC
  Administered 2016-01-09 (×2): 100 mg via ORAL
  Filled 2016-01-09 (×6): qty 1

## 2016-01-09 MED ORDER — LIDOCAINE 5 % EX PTCH
1.0000 | MEDICATED_PATCH | Freq: Every day | CUTANEOUS | Status: DC
  Administered 2016-01-09 – 2016-01-11 (×3): 1 via TRANSDERMAL
  Filled 2016-01-09 (×5): qty 1

## 2016-01-09 MED ORDER — DULOXETINE HCL 30 MG PO CPEP
30.0000 mg | ORAL_CAPSULE | Freq: Every day | ORAL | Status: DC
Start: 2016-01-10 — End: 2016-01-11
  Administered 2016-01-10 – 2016-01-11 (×2): 30 mg via ORAL
  Filled 2016-01-09: qty 14
  Filled 2016-01-09 (×3): qty 1
  Filled 2016-01-09: qty 14
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
## 2016-01-09 NOTE — Plan of Care (Signed)
Problem: Alteration in mood Goal: STG-Patient is able to discuss feelings and issues (Patient is able to discuss feelings and issues leading to depression)  Outcome: Progressing Pt verbalized to writer symptoms of depression and rates it at 7/10.

## 2016-01-09 NOTE — Plan of Care (Signed)
Problem: Alteration in mood Goal: LTG-Patient reports reduction in suicidal thoughts (Patient reports reduction in suicidal thoughts and is able to verbalize a safety plan for whenever patient is feeling suicidal)  Outcome: Progressing Pt denies suicidal thoughts. Pt verbally contracts for safety.

## 2016-01-09 NOTE — BHH Group Notes (Signed)
[facility] LCSW Aftercare Discharge Planning Group Note   01/09/2016 11:31 AM  Participation Quality:  Invited. Chose to remain in bed. DID NOT ATTEND.   Smart, Karoline Fleer LCSW

## 2016-01-09 NOTE — BHH Group Notes (Signed)
[place] LCSW Group Therapy 01/09/2016  1:15 PM Type of Therapy: Group Therapy Participation Level: Minimal  Participation Quality: Attentive  Affect: Depressed and Flat  Cognitive: Alert and Oriented  Insight: Developing/Improving and Engaged  Engagement in Therapy: Developing/Improving and Engaged  Modes of Intervention: Clarification, Confrontation, Discussion, Education, Exploration, Limit-setting, Orientation, Problem-solving, Rapport Building, Art therapist, Socialization and Support  Summary of Progress/Problems: The topic for group today was emotional regulation. This group focused on both positive and negative emotion identification and allowed group members to process ways to identify feelings, regulate negative emotions, and find healthy ways to manage internal/external emotions. Group members were asked to reflect on a time when their reaction to an emotion led to a negative outcome and explored how alternative responses using emotion regulation would have benefited them. Group members were also asked to discuss a time when emotion regulation was utilized when a negative emotion was experienced. Patient participated minimally in discussion despite CSW encouragement.  Tilden Fossa, MSW, [place] Clinical Social Worker [facility] [phone]

## 2016-01-09 NOTE — Progress Notes (Signed)
Pt attended NA group this evening.

## 2016-01-09 NOTE — Progress Notes (Signed)
D: Pt presents with flat affect and depressed mood. Pt rates depression 7/10. Anxiety 7/10. Hopeless 6/10. Pt denies suicidal thoughts this morning. Pt reports good sleep and appetite. Pt reports withdrawal symptoms of tremors and anxiety. Pt complained of chronic back pain and was given Tramadol for pain.  A:  Medications administered as ordered per MD. Verbal support provided. Pt encouraged to attend groups. 15 minute checks performed for safety.  R: Pt receptive to tx. Pt stated goal "deal with my depression".

## 2016-01-09 NOTE — Progress Notes (Signed)
D   Pt is appropriate and pleasant on approach   He reports sleep problems and is hoping he will get some sleep tonight   Pt took his sleep medication around 2245  A    Verbal support given    Medications administered and effectiveness monitored     Q 15 min checks R    Pt safe at present

## 2016-01-09 NOTE — BHH Suicide Risk Assessment (Signed)
[place] INPATIENT:  Family/Significant Other Suicide Prevention Education  Suicide Prevention Education:  Patient Refusal for Family/Significant Other Suicide Prevention Education: The patient Jacob Adams has refused to provide written consent for family/significant other to be provided Family/Significant Other Suicide Prevention Education during admission and/or prior to discharge.  Physician notified. SPE reviewed with patient and brochure provided. Patient encouraged to return to hospital if having suicidal thoughts, patient verbalized his/her understanding and has no further questions at this time.   Meria Crilly, Casimiro Needle 01/09/2016, 8:03 AM

## 2016-01-10 MED ORDER — FLUTICASONE PROPIONATE 50 MCG/ACT NA SUSP
1.0000 | Freq: Two times a day (BID) | NASAL | Status: DC
  Administered 2016-01-10 – 2016-01-11 (×2): 1 via NASAL
  Filled 2016-01-10 (×2): qty 16

## 2016-01-10 MED ORDER — QUETIAPINE FUMARATE 200 MG PO TABS
200.0000 mg | ORAL_TABLET | Freq: Every day | ORAL | Status: DC
  Administered 2016-01-10: 200 mg via ORAL
  Filled 2016-01-10: qty 1
  Filled 2016-01-10 (×2): qty 14
  Filled 2016-01-10: qty 1

## 2016-01-10 MED ORDER — PSEUDOEPHEDRINE HCL 30 MG PO TABS: 30.0000 mg | ORAL_TABLET | Freq: Four times a day (QID) | ORAL | Status: DC | PRN

## 2016-01-10 NOTE — Progress Notes (Signed)
[facility] referrals pending.  Tilden Fossa, LCSW Clinical Social Worker [facility], [place] [phone]

## 2016-01-10 NOTE — Progress Notes (Signed)
[facility] Group Notes:  (Nursing/MHT/Case Management/Adjunct)  Date:  01/10/2016  Time:  2100  Type of Therapy:  wrap up group  Participation Level:  Active  Participation Quality:  Appropriate, Attentive, Sharing and Supportive  Affect:  Appropriate  Cognitive:  Appropriate  Insight:  Lacking  Engagement in Group:  Engaged  Modes of Intervention:  Clarification, Education and Support  Summary of Progress/Problems:Pt shares that his has always had a lot of trouble sleeping and if the right medicine doesn't work on his mind and body at exactly the same time then sleep wont come. Pt also reports that he thinks he can "keep the crazy away this time" and he is looking forward to a job he has waiting for him and a girlfriend that is also waiting for him. Pt admits to putting more stock in the job over the girlfriend.   Shellia Cleverly 01/10/2016, 11:45 PM

## 2016-01-10 NOTE — Progress Notes (Signed)
D: Pt presents with flat affect and anxious mood. Pt rates depression 6/10. Hopeless 6/10. Anxiety 7/10. Pt denies suicidal thoughts. Pt reported poor sleep last night due to racing thoughts and AVH. Pt reported active AH this morning. Pt denies withdrawal symptoms this morning. Pt compliant with taking meds and attending groups. No adverse reactions to meds verbalized by pt.  A: Medications administered as ordered per MD. Orders reviewed by writer. Verbal support provided. Pt encouraged to attend groups. 15 minute checks performed for safety. R: Pt stated goal "pain control, anxiety, depression and work on discharge plans". Pt receptive to tx.
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
## 2016-01-10 NOTE — Progress Notes (Signed)
Adult Psychoeducational Group Note  Date:  01/10/2016 Time:  0900 am   Group Topic/Focus:  Orientation:   The focus of this group is to educate the patient on the purpose and policies of crisis stabilization and provide a format to answer questions about their admission.  The group details unit policies and expectations of patients while admitted.  Participation Level:  Active  Participation Quality:  Appropriate  Affect:  Appropriate  Cognitive:  Appropriate  Insight: Appropriate  Engagement in Group:  Engaged  Modes of Intervention:  Education  Additional Comments:    Celester Morgan L 01/10/2016, 2:09 PM

## 2016-01-10 NOTE — Progress Notes (Signed)
D   Pt is appropriate and pleasant on approach   He reports sleep problems and is hoping he will get some sleep tonight    Complaining of nasal congestion  A    Verbal support given    Medications administered and effectiveness monitored     Q 15 min checks R    Pt safe at present

## 2016-01-10 NOTE — [facility] Group Notes (Signed)
[facility] LCSW Group Therapy 01/10/2016 1:15 PM Type of Therapy: Group Therapy Participation Level: Active  Participation Quality: Attentive, Sharing and Supportive  Affect: Blunted  Cognitive: Alert and Oriented  Insight: Developing/Improving and Engaged  Engagement in Therapy: Developing/Improving and Engaged  Modes of Intervention: Activity, Clarification, Confrontation, Discussion, Education, Exploration, Limit-setting, Orientation, Problem-solving, Rapport Building, Reality Testing, Socialization and Support  Summary of Progress/Problems: Patient was attentive and engaged with speaker from [employer]. Patient was attentive to speaker while they shared their story of dealing with mental health and overcoming it. Patient expressed interest in their programs and services and received information on their agency. Patient processed ways they can relate to the speaker.   Ruthellen Tippy, LCSW Clinical Social Worker [facility] [phone]   

## 2016-01-11 MED ORDER — QUETIAPINE FUMARATE 50 MG PO TABS: 50.0000 mg | ORAL_TABLET | Freq: Three times a day (TID) | ORAL | Status: DC

## 2016-01-11 MED ORDER — NICOTINE 21 MG/24HR TD PT24: 21.0000 mg | MEDICATED_PATCH | Freq: Every day | TRANSDERMAL | Status: DC

## 2016-01-11 MED ORDER — AMLODIPINE BESYLATE 10 MG PO TABS: 10.0000 mg | ORAL_TABLET | Freq: Every day | ORAL | Status: DC

## 2016-01-11 MED ORDER — ATORVASTATIN CALCIUM 40 MG PO TABS: 40.0000 mg | ORAL_TABLET | Freq: Every day | ORAL | Status: DC

## 2016-01-11 MED ORDER — DULOXETINE HCL 30 MG PO CPEP: 30.0000 mg | ORAL_CAPSULE | Freq: Every day | ORAL | Status: DC

## 2016-01-11 MED ORDER — VITAMIN D3 25 MCG (1000 UNIT) PO TABS: 1000.0000 [IU] | ORAL_TABLET | Freq: Every day | ORAL | Status: DC

## 2016-01-11 MED ORDER — QUETIAPINE FUMARATE 200 MG PO TABS: 200.0000 mg | ORAL_TABLET | Freq: Every day | ORAL | Status: DC

## 2016-01-11 NOTE — Tx Team (Signed)
Interdisciplinary Treatment Plan Update (Adult) Date: 01/11/2016   Time Reviewed: 9:30 AM  Progress in Treatment: Attending groups: Yes Participating in groups: Minimally Taking medication as prescribed: Yes Tolerating medication: Yes Family/Significant other contact made: No, patient declines collateral contact. SPE completed with pt.  Patient understands diagnosis: Yes Discussing patient identified problems/goals with staff: Yes Medical problems stabilized or resolved: Yes Denies suicidal/homicidal ideation: Yes Issues/concerns per patient self-inventory: Yes Other:  New problem(s) identified: N/A  Discharge Plan or Barriers: Pt accepted to [facility] for today at 1PM.  Reason for Continuation of Hospitalization:  None  Comments: N/A  Estimated length of stay: d/c today  Patient is a 50 year old male with a diagnosis of Alcohol Use Disorder, Major Depressive Disorder. Pt presented to the hospital with alcohol abuse and depression. Pt reports primary trigger(s) for admission was homelessness, financial, and social stressors. Patient will benefit from crisis stabilization, medication evaluation, group therapy and psycho education in addition to case management for discharge planning. At discharge, it is recommended that Pt remain compliant with established discharge plan and continued treatment.  Review of initial/current patient goals per problem list:  1. Goal(s): Patient will participate in aftercare plan   Met: Yes   Target date: 3-5 days post admission date   As evidenced by: Patient will participate within aftercare plan AEB aftercare provider and housing plan at discharge being identified.   3/21: Goal not met: CSW assessing for appropriate referrals for pt and will have follow up secured prior to d/c.   3/24: Goal met. Pt has been accepted to [facility] for today at 1pm.    2. Goal (s): Patient will exhibit decreased depressive symptoms and suicidal ideations.   Met:  Yes   Target date: 3-5 days post admission date   As evidenced by: Patient will utilize self rating of depression at 3 or below and demonstrate decreased signs of depression or be deemed stable for discharge by MD.   3/20: Goal not met: Patient rates depression at 9. Denies SI.   3/24: Goal met. Pt rates depression as 2/10 and presents with pleasant mood/calm affect.   3. Goal(s): Patient will demonstrate decreased signs and symptoms of anxiety.   Met: Yes   Target date: 3-5 days post admission date   As evidenced by: Patient will utilize self rating of anxiety at 3 or below and demonstrated decreased signs of anxiety, or be deemed stable for discharge by MD  3/20: Patient rates anxiety at 8.   3/24: Pt rates anxiety as 3/10 and presents with pleasant mood/calm affect.     4. Goal(s): Patient will demonstrate decreased signs of withdrawal due to substance abuse   Met: Yes   Target date: 3-5 days post admission date   As evidenced by: Patient will produce a CIWA/COWS score of 0, have stable vitals signs, and no symptoms of withdrawal  3/20: Goal met. No withdrawal symptoms reported at this time per medical chart.    5. Goal(s): Patient will demonstrate decreased signs of psychosis  * Met: Goal met  * Target date: 3-5 days post admission date  * As evidenced by: Patient will demonstrate decreased frequency of AVH or return to baseline function  3/21: Goal not met: Pt to take medication as prescribed to decrease psychosis to baseline.    3/24: Goal met. Pt denies SI/HI/AVH. Pt does not demonstrate signs of psychosis.     Attendees: Patient:    Family:    Physician:  Dr. Carrington Clamp 01/11/2016 10:24  AM   Nursing: Carlynn Purl RN  01/11/2016 10:24 AM   Clinical Social Worker: Tilden Fossa, LCSW 01/11/2016 10:24 AM   Other: Maxie Better, LCSW  01/11/2016 10:24 AM   Other:    Other: Lars Pinks, Case Manager 01/11/2016 10:24 AM   Other:  Agustina Caroli, May Augustin, NP 01/11/2016 10:24 AM   Other:                   Scribe for Treatment Team:  Maxie Better, MSW, LCSW Clinical Social Worker 01/11/2016 10:25 AM

## 2016-01-11 NOTE — Progress Notes (Signed)
Patient verbalizes readiness for discharge. Follow up plan explained, Rx's given along with 14 day supply of meds. All belongings returned. He verbalizes understanding. Denies SI/HI. Discharged ambulatory and in stable condition. Jamie Kato

## 2016-01-11 NOTE — Progress Notes (Signed)
Pt was observed at the beginning of the shift in the dayroom watching TV with minimal interaction with his peers.  He reports he has had a good day.  He denies SI/HI, but is still hearing voices that he says are decreasing.  He denies having any withdrawal symptoms at this time.  He states he has been going to groups.  He voices no needs or concerns at this time.  Support and encouragement offered.  Discharge plans are in process.  Safety maintained with q15 minute checks.

## 2016-01-11 NOTE — [facility] Suicide Risk Assessment (Signed)
[facility] Discharge Suicide Risk Assessment   Principal Problem: MDD (major depressive disorder), recurrent, severe, with psychosis ([place]) Discharge Diagnoses:  Patient Active Problem List   Diagnosis Date Noted  . Alcohol use disorder ([place]) [F10.99] 01/06/2016  . MDD (major depressive disorder), recurrent, severe, with psychosis ([place]) [F33.3] 11/03/2015  . Alcohol use disorder, severe, dependence ([place]) [F10.20] 11/03/2015  . Bleeding internal hemorrhoids [K64.8]   . Acute lower GI bleeding [K92.2] 10/31/2015  . Pancytopenia ([place]) [J94.174] 10/31/2015  . 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 Suicidal intent [R45.851] 10/31/2015  . Transaminitis [R74.0] 10/31/2015  . Hematochezia [K92.1]   . Hematemesis [K92.0]   . Acute blood loss anemia [D62]     Total Time spent with patient: 15 minutes  Musculoskeletal: Strength & Muscle Tone: within normal limits Gait & Station: normal Patient leans: Right  Psychiatric Specialty Exam: Review of Systems  Constitutional: Negative.   HENT: Negative.   Eyes: Negative.   Respiratory: Negative.   Cardiovascular: Negative.   Gastrointestinal: Negative.   Genitourinary: Negative.   Musculoskeletal: Negative.   Skin: Negative.   Neurological: Negative.   Endo/Heme/Allergies: Negative.   Psychiatric/Behavioral: Positive for substance abuse.    Blood pressure 103/67, pulse 109, temperature 99.7 F (37.6 C), temperature source Oral, resp. rate 20, height 5' 10"  (1.778 m), weight 89.359 kg (197 lb), SpO2 98 %.Body mass index is 28.27 kg/(m^2).  General Appearance: Fairly Groomed  Engineer, water::  Fair  Speech:  Clear and [id number]  Volume:  Normal  Mood:  Euthymic  Affect:  Appropriate  Thought Process:  Coherent and Goal Directed  Orientation:  Full (Time, Place, and Person)  Thought Content:  plans as he moves on, relapse prevention plan  Suicidal Thoughts:  No  Homicidal Thoughts:  No  Memory:   Immediate;   Fair Recent;   Fair Remote;   Fair  Judgement:  Fair  Insight:  Present  Psychomotor Activity:  Normal  Concentration:  Fair  Recall:  [employer] of Knowledge:Fair  Language: Fair  Akathisia:  No  Handed:  Right  AIMS (if indicated):     Assets:  Desire for Improvement  Sleep:  Number of Hours: 6  Cognition: WNL  ADL's:  Intact  In full contact with reality. There are no active S/S of withdrawal. There are no active SI plans or intent. He is willing and motivated to pursue residential treatment and long term abstinence Mental Status Per Nursing Assessment::   On Admission:  Suicidal ideation indicated by patient  Demographic Factors:  Male  Loss Factors: Decline in physical health  Historical Factors: none identified  Risk Reduction Factors:   wants to do better  Continued Clinical Symptoms:  Depression:   Comorbid alcohol abuse/dependence Alcohol/Substance Abuse/Dependencies  Cognitive Features That Contribute To Risk:  None    Suicide Risk:  Minimal: No identifiable suicidal ideation.  Patients presenting with no risk factors but with morbid ruminations; may be classified as minimal risk based on the severity of the depressive symptoms  Follow-up Information    Follow up with [facility] On 01/11/2016.   Why:  You have been accepted for admission today. [facility] driver will pick you up at 1:00PM. Please make sure that you have 14 day supply of medications.    Contact information:   [address] [place], [place] [address] Phone: [phone] Fax: [phone]      Plan Of Care/Follow-up recommendations:  Activity:  as tolerated Diet:  regular  Nicholaus Bloom, MD 01/11/2016, 12:38 PM

## 2016-01-11 NOTE — Progress Notes (Signed)
  [facility] Adult Case Management Discharge Plan :  Will you be returning to the same living situation after discharge:  No.Pt accepted to [facility] for today at 1pm. At discharge, do you have transportation home?: Yes,  [facility] driver will arrive at [place] you have the ability to pay for your medications: Yes,  medication management  Release of information consent forms completed and submitted to medical records by CSW.   Patient to Follow up at: Follow-up Information    Follow up with [facility] On 01/11/2016.   Why:  You have been accepted for admission today. [facility] driver will pick you up at 1:00PM. Please make sure that you have 14 day supply of medications.    Contact information:   [address] [place],  [address] Phone: [phone] Fax: [phone]      Next level of care provider has access to [place] and Suicide Prevention discussed: Yes,  SPE completed with pt, as he declined to consent to family contact. SPI pamphlet and mobile crisis information provided to pt and he was encouraged to share information with support network.   Have you used any form of tobacco in the last 30 days? (Cigarettes, Smokeless Tobacco, Cigars, and/or Pipes): Yes  Has patient been referred to the Quitline?: Patient refused referral  Patient has been referred for addiction treatment: Yes-see above.   Smart, Jacob Roulston LCSW 01/11/2016, 10:17 AM
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
## 2016-01-11 NOTE — Progress Notes (Signed)
Found patient resting in bed. Did come up on for AM meds upon request. Slow to ambulate however steady. Affect flat, mood stable. Rates depression at a 4/10, hopelessness and anxiety both at a 5/10. Reports goal is to work toward discharge toward [place] as well as to continue controlling his depression. Reports headache of a 4/10. No other physical symptoms. Patient medicated per orders. Given prn ultram for pain. Emotional support and reassurance offered. Self inventory reviewed. On reassess, patient asleep. He denies SI/HI and reports no AVH at this time. Awaiting discharge order from provider. Jamie Kato

## 2016-12-25 ENCOUNTER — Emergency Department (HOSPITAL_COMMUNITY)
Admission: EM | Admit: 2016-12-25 | Discharge: 2016-12-25 | Disposition: A | Discharge status: Home / Self Care | Payer: Non-veteran care | Attending: Emergency Medicine | Admitting: Emergency Medicine

## 2016-12-25 ENCOUNTER — Emergency Department (HOSPITAL_COMMUNITY): Payer: Non-veteran care

## 2016-12-25 ENCOUNTER — Encounter (HOSPITAL_COMMUNITY): Payer: Self-pay

## 2016-12-25 DIAGNOSIS — F17210 Nicotine dependence, cigarettes, uncomplicated: Secondary | ICD-10-CM | POA: Insufficient documentation

## 2016-12-25 DIAGNOSIS — Z79899 Other long term (current) drug therapy: Secondary | ICD-10-CM | POA: Insufficient documentation

## 2016-12-25 DIAGNOSIS — R079 Chest pain, unspecified: Secondary | ICD-10-CM | POA: Diagnosis present

## 2016-12-25 DIAGNOSIS — R0789 Other chest pain: Secondary | ICD-10-CM | POA: Diagnosis not present

## 2016-12-25 DIAGNOSIS — K625 Hemorrhage of anus and rectum: Secondary | ICD-10-CM | POA: Diagnosis not present

## 2016-12-25 DIAGNOSIS — G43009 Migraine without aura, not intractable, without status migrainosus: Secondary | ICD-10-CM | POA: Diagnosis not present

## 2016-12-25 DIAGNOSIS — I10 Essential (primary) hypertension: Secondary | ICD-10-CM | POA: Insufficient documentation

## 2016-12-25 LAB — BASIC METABOLIC PANEL
Anion gap: 19 — ABNORMAL HIGH (ref 5–15)
BUN: 5 mg/dL — AB (ref 6–20)
CALCIUM: 8.7 mg/dL — AB (ref 8.9–10.3)
CO2: 17 mmol/L — AB (ref 22–32)
CREATININE: 0.81 mg/dL (ref 0.61–1.24)
Chloride: 100 mmol/L — ABNORMAL LOW (ref 101–111)
GFR calc non Af Amer: >60 mL/min (ref >60)
GLUCOSE: 81 mg/dL (ref 65–99)
Potassium: 3.7 mmol/L (ref 3.5–5.1)
Sodium: 136 mmol/L (ref 135–145)

## 2016-12-25 LAB — I-STAT TROPONIN, ED
TROPONIN I, POC: 0.01 ng/mL (ref 0.00–0.08)
TROPONIN I, POC: 0 ng/mL (ref 0.00–0.08)

## 2016-12-25 LAB — CBC
HCT: 39.3 % (ref 39.0–52.0)
Hemoglobin: 13.1 g/dL (ref 13.0–17.0)
MCH: 30.6 pg (ref 26.0–34.0)
MCHC: 33.3 g/dL (ref 30.0–36.0)
MCV: 91.8 fL (ref 78.0–100.0)
PLATELETS: 167 10*3/uL (ref 150–400)
RBC: 4.28 MIL/uL (ref 4.22–5.81)
RDW: 16.1 % — AB (ref 11.5–15.5)
WBC: 3.1 10*3/uL — ABNORMAL LOW (ref 4.0–10.5)

## 2016-12-25 LAB — HEPATIC FUNCTION PANEL
ALT: 76 U/L — ABNORMAL HIGH (ref 17–63)
AST: 167 U/L — ABNORMAL HIGH (ref 15–41)
Albumin: 3.8 g/dL (ref 3.5–5.0)
Alkaline Phosphatase: 68 U/L (ref 38–126)
Total Bilirubin: 0.6 mg/dL (ref 0.3–1.2)
Total Protein: 7.4 g/dL (ref 6.5–8.1)

## 2016-12-25 LAB — PROTIME-INR
INR: 0.96
PROTHROMBIN TIME: 12.8 s (ref 11.4–15.2)

## 2016-12-25 LAB — LIPASE, BLOOD: LIPASE: 42 U/L (ref 11–51)

## 2016-12-25 LAB — ABO/RH: ABO/RH(D): O POS

## 2016-12-25 LAB — APTT: aPTT: 34 seconds (ref 24–36)

## 2016-12-25 LAB — POC OCCULT BLOOD, ED: FECAL OCCULT BLD: NEGATIVE

## 2016-12-25 MED ORDER — SODIUM CHLORIDE 0.9 % IV BOLUS (SEPSIS)
1000.0000 mL | Freq: Once | INTRAVENOUS | Status: AC
  Administered 2016-12-25: 1000 mL via INTRAVENOUS

## 2016-12-25 MED ORDER — GI COCKTAIL ~~LOC~~
30.0000 mL | Freq: Once | ORAL | Status: AC
  Administered 2016-12-25: 30 mL via ORAL
  Filled 2016-12-25: qty 30

## 2016-12-25 MED ORDER — HYDROCORTISONE ACETATE 25 MG RE SUPP: 25.0000 mg | Freq: Two times a day (BID) | RECTAL | 0 refills | Status: DC

## 2016-12-25 MED ORDER — PANTOPRAZOLE SODIUM 40 MG PO TBEC
40.0000 mg | DELAYED_RELEASE_TABLET | Freq: Once | ORAL | Status: AC
  Administered 2016-12-25: 40 mg via ORAL
  Filled 2016-12-25: qty 1

## 2016-12-25 MED ORDER — PROCHLORPERAZINE EDISYLATE 5 MG/ML IJ SOLN
10.0000 mg | Freq: Once | INTRAMUSCULAR | Status: AC
  Administered 2016-12-25: 10 mg via INTRAVENOUS
  Filled 2016-12-25: qty 2

## 2016-12-25 MED ORDER — OMEPRAZOLE 40 MG PO CPDR: 40.0000 mg | DELAYED_RELEASE_CAPSULE | Freq: Every day | ORAL | 1 refills | Status: DC

## 2016-12-25 NOTE — ED Provider Notes (Signed)
By signing my name below, I, Dyke Brackett, attest that this documentation has been prepared under the direction and in the presence of [place], DO . Electronically Signed: Dyke Brackett, Scribe. 12/25/2016. 1:56 AM.   TIME SEEN: 1:08 AM  CHIEF COMPLAINT:  Chief Complaint  Patient presents with  . GI Bleeding  . Chest Pain    HPI: Jacob Adams is a 51 y.o. male with a history of bipolar disorder, migraines, HTN, HLD, pancreatitis and rectal bleeding from internal hemorrhoids who presents to the Emergency Department complaining ofMultiple different complaints. Patient arrives here with police from jail.   First patient is complaining of intermittent, gradually worsening chest pain onset one week ago. He describes this as sharp pain with radiation to the left arm. Per pt, he had similar pain a few weeks ago which resolved on its own.  No associated SOB, diaphoresis, dizziness.  No alleviating or modifying factors noted. Per pt, he has had 2 cardiac catheterizations, last done several years ago but denies history of stents.  Second, he also reports abdominal pain, hematemesis, decreased appetite, hematochezia.  He reports his last episode of hematemesis earlier this evening. States he only had 2 episodes of vomiting blood which she describes as bright red. States he has had a month of hematochezia passing small clots. No melena. Describes abdominal pain as diffuse, crampy. Had a colonoscopy that showed internal hemorrhoids were otherwise unremarkable GI 2017. Also had endoscopy which only showed mild gastritis [facility] 2017.   Third, patient also complains of diffuse throbbing headache that is similar to his previous migraines. Per pt, his headache feels like his typical migraines. Denies head injury. Denies numbness, tingling or focal weakness until I am examining him and reports he has a patch of numbness over the right dorsal forearm. She takes medication at home for his migraines but he  cannot remember the name.    Per pt, he was picked up by the police today and has been in custody for about an hour. Pt states he was planning on coming to the ED before being arrested tonight.     ROS: See HPI Constitutional: no fever  Eyes: no drainage  ENT: no runny nose   Cardiovascular:   chest pain  Resp: no SOB  GI: no vomiting GU: no dysuria Integumentary: no rash  Allergy: no hives  Musculoskeletal: no leg swelling  Neurological: no slurred speech ROS otherwise negative  PAST MEDICAL HISTORY/PAST SURGICAL HISTORY:  Past Medical History:  Diagnosis Date  . Acid reflux   . Anxiety   . Arthritis    "toes" (07/26/2014)  . Bipolar disorder ([place])   . Depression   . NIDPOEUM(353.6)    "weekly" (07/26/2014)  . History of blood transfusion ~ 2000   "related to nose bleeding"  . History of stomach ulcers   . Hypertension   . Lower GI bleeding admitted 07/26/2014  . Mental disorder   . Migraine    "@ least monthly" (07/26/2014)  . Pancreatitis   . Rectal bleeding 07/26/2014  . Sleep apnea    "haven't been RX'd mask yet" (07/26/2014)    MEDICATIONS:  Prior to Admission medications   Medication Sig Start Date End Date Taking? Authorizing Provider  amLODipine (NORVASC) 10 MG tablet Take 1 tablet (10 mg total) by mouth at bedtime. 01/11/16   Kerrie Buffalo, NP  atorvastatin (LIPITOR) 40 MG tablet Take 1 tablet (40 mg total) by mouth at bedtime. 01/11/16   Kerrie Buffalo, NP  cholecalciferol (VITAMIN D) 1000 units tablet Take 1 tablet (1,000 Units total) by mouth at bedtime. 01/11/16   Kerrie Buffalo, NP  DULoxetine (CYMBALTA) 30 MG capsule Take 1 capsule (30 mg total) by mouth daily. 01/11/16   Kerrie Buffalo, NP  nicotine (NICODERM CQ - DOSED IN MG/24 HOURS) 21 mg/24hr patch Place 1 patch (21 mg total) onto the skin daily. 01/11/16   Kerrie Buffalo, NP  QUEtiapine (SEROQUEL) 200 MG tablet Take 1 tablet (200 mg total) by mouth at bedtime. 01/11/16   Kerrie Buffalo, NP  QUEtiapine  (SEROQUEL) 50 MG tablet Take 1 tablet (50 mg total) by mouth 3 (three) times daily. 01/11/16   Kerrie Buffalo, NP    ALLERGIES:  Allergies  Allergen Reactions  . Uncoded Nonscreenable Allergen Other (See Comments)    Sun tan lotion: swelling and peeling  . Acetaminophen Other (See Comments)    Make he side hurt; he stated he has liver damage.   . Ibuprofen Swelling    Swelling and discomfort of stomach  . Nsaids Other (See Comments)    Swelling and discomfort of stomach  . Penicillins Itching, Swelling and Rash    Has patient had a PCN reaction causing immediate rash, facial/tongue/throat swelling, SOB or lightheadedness with hypotension: yes Has patient had a PCN reaction causing severe rash involving mucus membranes or skin necrosis: no Has patient had a PCN reaction that required hospitalization yes, happened while hospitalized Has patient had a PCN reaction occurring within the last 10 years: yes If all of the above answers are "NO", then may proceed with Cephalosporin use.   . [name] Other (See Comments)    DOESN'T EAT PORK-patient preference    SOCIAL HISTORY:  Social History  Substance Use Topics  . Smoking status: Current Every Day Smoker    Packs/day: 1.00    Years: 20.00    Types: Cigarettes  . Smokeless tobacco: Never Used  . Alcohol use Yes     Comment: Daily. Last drink: a few hours ago. Drink as much as possible.     FAMILY HISTORY: History reviewed. No pertinent family history.  EXAM: BP 131/87 (BP Location: Right Arm)   Pulse 92   Temp 98.1 F (36.7 C) (Oral)   Resp 16   SpO2 96% Comment: Simultaneous filing. User may not have seen previous data. CONSTITUTIONAL: Alert and oriented and responds appropriately to questions. Well-appearing; well-nourished HEAD: Normocephalic EYES: Conjunctivae clear, pupils appear equal, EOMI ENT: normal nose; moist mucous membranes NECK: Supple, no meningismus, no nuchal rigidity, no LAD  CARD: RRR; S1  and S2 appreciated; no murmurs, no clicks, no rubs, no gallops RESP: Normal chest excursion without splinting or tachypnea; breath sounds clear and equal bilaterally; no wheezes, no rhonchi, no rales, no hypoxia or respiratory distress, speaking full sentences ABD/GI: Normal bowel sounds; non-distended; soft, non-tender, no rebound, no guarding, no peritoneal signs, no hepatosplenomegaly RECTAL:  Normal rectal tone, no gross blood or melena, guaiac negative, no hemorrhoids appreciated, nontender rectal exam, no fecal impaction BACK:  The back appears normal and is non-tender to palpation, there is no CVA tenderness EXT: Normal ROM in all joints; non-tender to palpation; no edema; normal capillary refill; no cyanosis, no calf tenderness or swelling    SKIN: Normal color for age and race; warm; no rash NEURO: Moves all extremities equally; Strength 5/5 in all four extremities.  Normal sensation diffusely.  CN 2-12 grossly intact.  No dysmetria to finger to nose testing bilaterally.  Normal speech.  Normal gait. PSYCH: The patient's mood and manner are appropriate. Grooming and personal hygiene are appropriate.  MEDICAL DECISION MAKING: Patient here at multiple different complaints. Chest pain seems atypical. Abdominal exam and patient is distracted is benign. He is guaiac negative here. Hemodynamically stable. Suspect that his consistent rectal bleeding is from his internal hemorrhoids which was seen on colonoscopy a little over a year ago. He states last episode of vomiting blood was several hours ago. I have low suspicion that he is actually vomiting blood but we will continue to monitor him. He is also complaining of a typical migraine headache. No focal neurologic deficits. Will give Compazine and IV fluids. As for his abdominal pain and chest pain this could be related to GERD, gastritis. We'll treat with GI cocktail, Protonix. I do not feel any sedatives or narcotics medications are indicated for this  patient. I feel there may be some component of malingering to get out of jail tonight.  ED PROGRESS: 3:30 AM  Pt's labs are unremarkable other than mild elevation of his AST and ALT. I suspect he has a history of alcohol abuse. Again abdominal exam is still benign. He is now sleeping comfortably and reports feeling better. He has not had any rectal bleeding, vomiting here in the emergency department. First troponin is negative. Chest x-ray is clear. Plan is to repeat second troponin if this is negative he can be discharged with police back to jail.   4:50 AM  Pt's second troponin negative. He has been able to eat and drink without difficulty.  We have not seen any vomiting or any bowel movement with blood. I feel he is safe to be discharged to jail. Given outpatient PCP and GI follow-up information.   At this time, I do not feel there is any life-threatening condition present. I have reviewed and discussed all results (EKG, imaging, lab, urine as appropriate) and exam findings with patient/family. I have reviewed nursing notes and appropriate previous records.  I feel the patient is safe to be discharged home without further emergent workup and can continue workup as an outpatient as needed. Discussed usual and customary return precautions. Patient/family verbalize understanding and are comfortable with this plan.  Outpatient follow-up has been provided if needed. All questions have been answered.    EKG Interpretation  Date/Time:  Thursday December 25 2016 01:03:15 EST Ventricular Rate:  94 PR Interval:    QRS Duration: 93 QT Interval:  350 QTC Calculation: 438 R Axis:   -50 Text Interpretation:  Age not entered, assumed to be  51 years old for purpose of ECG interpretation Sinus rhythm Left anterior fascicular block Abnormal R-wave progression, late transition No significant change since last tracing Confirmed by Elaina Cara,  DO, Laron Boorman [phone]) on 12/25/2016 1:07:31 AM        I personally performed  the services described in this documentation, which was scribed in my presence. The recorded information has been reviewed and is accurate.    [place], DO 12/25/16 [phone]

## 2016-12-25 NOTE — Discharge Instructions (Addendum)
To find a primary care or specialty doctor please call [phone] or [phone] to access "[place] a Doctor Service."  You may also go on the [place] website at [email or web address]  There are also multiple [facility], [facility], [facility] and [facility] practices throughout the [place] that are frequently accepting new patients. You may find a clinic that is close to your home and contact them.  [place] [id number] [place]  [place] [address] [place] [phone]

## 2016-12-25 NOTE — ED Triage Notes (Signed)
Per [facility]    Pt coming from [place]. CP, ABD pain x1 day, vomiting blood x 2 days. Blood in stool x1 month. Pt alert and oriented. 4 zofran given on transport.

## 2016-12-26 LAB — TYPE AND SCREEN
ABO/RH(D): O POS
Antibody Screen: NEGATIVE

## 2018-05-24 ENCOUNTER — Emergency Department (HOSPITAL_COMMUNITY): Payer: Non-veteran care

## 2018-05-24 ENCOUNTER — Emergency Department (HOSPITAL_COMMUNITY)
Admission: EM | Admit: 2018-05-24 | Discharge: 2018-05-24 | Disposition: A | Discharge status: Home / Self Care | Payer: Non-veteran care | Attending: Emergency Medicine | Admitting: Emergency Medicine

## 2018-05-24 ENCOUNTER — Encounter (HOSPITAL_COMMUNITY): Payer: Self-pay

## 2018-05-24 ENCOUNTER — Other Ambulatory Visit: Payer: Self-pay

## 2018-05-24 DIAGNOSIS — R066 Hiccough: Secondary | ICD-10-CM

## 2018-05-24 DIAGNOSIS — R0602 Shortness of breath: Secondary | ICD-10-CM | POA: Diagnosis present

## 2018-05-24 DIAGNOSIS — F17210 Nicotine dependence, cigarettes, uncomplicated: Secondary | ICD-10-CM | POA: Diagnosis not present

## 2018-05-24 DIAGNOSIS — Z79899 Other long term (current) drug therapy: Secondary | ICD-10-CM | POA: Diagnosis not present

## 2018-05-24 DIAGNOSIS — I10 Essential (primary) hypertension: Secondary | ICD-10-CM | POA: Insufficient documentation

## 2018-05-24 DIAGNOSIS — R0789 Other chest pain: Secondary | ICD-10-CM | POA: Diagnosis not present

## 2018-05-24 DIAGNOSIS — K219 Gastro-esophageal reflux disease without esophagitis: Secondary | ICD-10-CM | POA: Insufficient documentation

## 2018-05-24 LAB — LIPASE, BLOOD: Lipase: 47 U/L (ref 11–51)

## 2018-05-24 LAB — ETHANOL

## 2018-05-24 LAB — BASIC METABOLIC PANEL
Anion gap: 15 (ref 5–15)
BUN: 8 mg/dL (ref 6–20)
CO2: 32 mmol/L (ref 22–32)
Calcium: 8.9 mg/dL (ref 8.9–10.3)
Chloride: 92 mmol/L — ABNORMAL LOW (ref 98–111)
Creatinine, Ser: 0.9 mg/dL (ref 0.61–1.24)
GFR calc Af Amer: >60 mL/min (ref >60)
GFR calc non Af Amer: >60 mL/min (ref >60)
Glucose, Bld: 100 mg/dL — ABNORMAL HIGH (ref 70–99)
Potassium: 3.9 mmol/L (ref 3.5–5.1)
Sodium: 139 mmol/L (ref 135–145)

## 2018-05-24 LAB — CBC
HCT: 41.1 % (ref 39.0–52.0)
Hemoglobin: 13.7 g/dL (ref 13.0–17.0)
MCH: 30.9 pg (ref 26.0–34.0)
MCHC: 33.3 g/dL (ref 30.0–36.0)
MCV: 92.6 fL (ref 78.0–100.0)
Platelets: 185 10*3/uL (ref 150–400)
RBC: 4.44 MIL/uL (ref 4.22–5.81)
RDW: 15.7 % — ABNORMAL HIGH (ref 11.5–15.5)
WBC: 5.3 10*3/uL (ref 4.0–10.5)

## 2018-05-24 LAB — I-STAT TROPONIN, ED
TROPONIN I, POC: 0 ng/mL (ref 0.00–0.08)
Troponin i, poc: 0 ng/mL (ref 0.00–0.08)

## 2018-05-24 MED ORDER — SUCRALFATE 1 GM/10ML PO SUSP: 1.0000 g | Freq: Three times a day (TID) | ORAL | 0 refills | Status: DC

## 2018-05-24 MED ORDER — PANTOPRAZOLE SODIUM 40 MG PO TBEC: 40.0000 mg | DELAYED_RELEASE_TABLET | Freq: Two times a day (BID) | ORAL | 0 refills | Status: DC

## 2018-05-24 MED ORDER — CHLORPROMAZINE HCL 25 MG PO TABS: 25.0000 mg | ORAL_TABLET | Freq: Three times a day (TID) | ORAL | 0 refills | Status: DC

## 2018-05-24 MED ORDER — DIPHENHYDRAMINE HCL 50 MG/ML IJ SOLN
25.0000 mg | Freq: Once | INTRAMUSCULAR | Status: AC
  Administered 2018-05-24: 25 mg via INTRAVENOUS
  Filled 2018-05-24: qty 1

## 2018-05-24 MED ORDER — PROCHLORPERAZINE EDISYLATE 10 MG/2ML IJ SOLN
10.0000 mg | Freq: Once | INTRAMUSCULAR | Status: AC
  Administered 2018-05-24: 10 mg via INTRAVENOUS
  Filled 2018-05-24: qty 2

## 2018-05-24 MED ORDER — CHLORPROMAZINE HCL 25 MG PO TABS
25.0000 mg | ORAL_TABLET | Freq: Once | ORAL | Status: AC
  Administered 2018-05-24: 25 mg via ORAL
  Filled 2018-05-24: qty 1

## 2018-05-24 MED ORDER — PANTOPRAZOLE SODIUM 40 MG PO TBEC
40.0000 mg | DELAYED_RELEASE_TABLET | Freq: Once | ORAL | Status: AC
  Administered 2018-05-24: 40 mg via ORAL
  Filled 2018-05-24: qty 1

## 2018-05-24 MED ORDER — GI COCKTAIL ~~LOC~~
30.0000 mL | Freq: Once | ORAL | Status: AC
  Administered 2018-05-24: 30 mL via ORAL
  Filled 2018-05-24: qty 30

## 2018-05-24 NOTE — ED Triage Notes (Addendum)
Pt endorses generalized weakness, diff breathing and hiccups and pt states "It always gets worse when I get the hiccups" Sx began 1 week ago. Also endorses central non radiating CP during same time. Drinks 10-40oz beers per day. Last drink last night.

## 2018-05-24 NOTE — ED Notes (Signed)
Pt verbalized understanding of additional instructions and medications provided by Dr. Sedonia Small. Pt ambulatory with steady gait to waiting area,

## 2018-05-24 NOTE — Discharge Instructions (Addendum)
Hiccups are likely due to acid reflux, your CT shows evidence of likely acid reflux but no other findings concerning for lung cancer or any other problems with the lung.  Please begin taking Protonix 40 mg at least 30 minutes before breakfast and 30 minutes to an hour before bedtime.  You may use Thorazine up to 3 times daily for the next 3 days for persistent hiccups.  You can also use Maalox or Tums.  Please follow-up with your GI doctor, as you will likely need a repeat EGD.    Return to the emergency department for worsening hiccups, abdominal or chest pain, shortness of breath or any other new or concerning symptoms.

## 2018-05-24 NOTE — ED Notes (Signed)
Pt approached NF requesting to check back in d/t return of hiccups, pt walked back to room for discussion with Dr. Sedonia Small and additional medication

## 2018-05-24 NOTE — ED Notes (Signed)
Pt walked to blue chairs by RN.  Pt had steady gait, VS are normal. Does not appear in acute distress. C/O of hiccups x 1 weeks.
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
## 2018-12-14 ENCOUNTER — Encounter (HOSPITAL_COMMUNITY): Payer: Self-pay | Admitting: Emergency Medicine

## 2018-12-14 ENCOUNTER — Other Ambulatory Visit: Payer: Self-pay

## 2018-12-14 ENCOUNTER — Emergency Department (HOSPITAL_COMMUNITY)
Admission: EM | Admit: 2018-12-14 | Discharge: 2018-12-14 | Disposition: A | Discharge status: Home / Self Care | Payer: No Typology Code available for payment source | Attending: Emergency Medicine | Admitting: Emergency Medicine

## 2018-12-14 DIAGNOSIS — F17210 Nicotine dependence, cigarettes, uncomplicated: Secondary | ICD-10-CM | POA: Diagnosis not present

## 2018-12-14 DIAGNOSIS — I10 Essential (primary) hypertension: Secondary | ICD-10-CM | POA: Insufficient documentation

## 2018-12-14 DIAGNOSIS — R1084 Generalized abdominal pain: Secondary | ICD-10-CM | POA: Diagnosis present

## 2018-12-14 DIAGNOSIS — Z79899 Other long term (current) drug therapy: Secondary | ICD-10-CM | POA: Insufficient documentation

## 2018-12-14 LAB — URINALYSIS, ROUTINE W REFLEX MICROSCOPIC
Bacteria, UA: NONE SEEN
Bilirubin Urine: NEGATIVE
Glucose, UA: NEGATIVE mg/dL
Ketones, ur: NEGATIVE mg/dL
Leukocytes,Ua: NEGATIVE
Nitrite: NEGATIVE
Protein, ur: NEGATIVE mg/dL
Specific Gravity, Urine: 1.005 (ref 1.005–1.030)
pH: 5 (ref 5.0–8.0)

## 2018-12-14 LAB — COMPREHENSIVE METABOLIC PANEL
ALT: 24 U/L (ref 0–44)
AST: 33 U/L (ref 15–41)
Albumin: 4 g/dL (ref 3.5–5.0)
Alkaline Phosphatase: 75 U/L (ref 38–126)
Anion gap: 13 (ref 5–15)
BUN: 5 mg/dL — ABNORMAL LOW (ref 6–20)
CO2: 21 mmol/L — AB (ref 22–32)
Calcium: 8.5 mg/dL — ABNORMAL LOW (ref 8.9–10.3)
Chloride: 105 mmol/L (ref 98–111)
Creatinine, Ser: 0.7 mg/dL (ref 0.61–1.24)
GFR calc Af Amer: >60 mL/min (ref >60)
GFR calc non Af Amer: >60 mL/min (ref >60)
Glucose, Bld: 98 mg/dL (ref 70–99)
Potassium: 3.8 mmol/L (ref 3.5–5.1)
SODIUM: 139 mmol/L (ref 135–145)
Total Bilirubin: 0.4 mg/dL (ref 0.3–1.2)
Total Protein: 7.8 g/dL (ref 6.5–8.1)

## 2018-12-14 LAB — CBC
HCT: 45 % (ref 39.0–52.0)
Hemoglobin: 14.5 g/dL (ref 13.0–17.0)
MCH: 28.7 pg (ref 26.0–34.0)
MCHC: 32.2 g/dL (ref 30.0–36.0)
MCV: 89.1 fL (ref 80.0–100.0)
Platelets: 332 10*3/uL (ref 150–400)
RBC: 5.05 MIL/uL (ref 4.22–5.81)
RDW: 14.6 % (ref 11.5–15.5)
WBC: 4.1 10*3/uL (ref 4.0–10.5)
nRBC: 0 % (ref 0.0–0.2)

## 2018-12-14 LAB — LIPASE, BLOOD: Lipase: 49 U/L (ref 11–51)

## 2018-12-14 MED ORDER — ALUM & MAG HYDROXIDE-SIMETH 200-200-20 MG/5ML PO SUSP
30.0000 mL | Freq: Once | ORAL | Status: AC
  Administered 2018-12-14: 30 mL via ORAL
  Filled 2018-12-14: qty 30

## 2018-12-14 MED ORDER — ONDANSETRON 4 MG PO TBDP: 4.0000 mg | ORAL_TABLET | Freq: Three times a day (TID) | ORAL | 0 refills | Status: DC | PRN

## 2018-12-14 MED ORDER — LIDOCAINE VISCOUS HCL 2 % MT SOLN
15.0000 mL | Freq: Once | OROMUCOSAL | Status: AC
  Administered 2018-12-14: 15 mL via ORAL
  Filled 2018-12-14: qty 15

## 2018-12-14 MED ORDER — ONDANSETRON 4 MG PO TBDP
4.0000 mg | ORAL_TABLET | Freq: Once | ORAL | Status: AC
  Administered 2018-12-14: 4 mg via ORAL
  Filled 2018-12-14: qty 1

## 2018-12-14 MED ORDER — SODIUM CHLORIDE 0.9% FLUSH: 3.0000 mL | Freq: Once | INTRAVENOUS | Status: DC
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
## 2018-12-14 NOTE — Discharge Instructions (Addendum)
Follow up with your family doc.    Imodium for diarrhea.  Return if your are unable to eat or drink, get a fever, or cant keep anything down.

## 2018-12-14 NOTE — ED Triage Notes (Signed)
Pt c/o generalized abdominal pain with nausea/vomiting/diarrhea that started this morning.

## 2018-12-24 ENCOUNTER — Emergency Department (HOSPITAL_COMMUNITY): Payer: Medicaid Other

## 2018-12-24 ENCOUNTER — Inpatient Hospital Stay (HOSPITAL_COMMUNITY)
Admission: EM | Admit: 2018-12-24 | Discharge: 2018-12-28 | DRG: 286 | Disposition: A | Discharge status: Home / Self Care | Payer: Medicaid Other | Attending: Internal Medicine | Admitting: Internal Medicine

## 2018-12-24 ENCOUNTER — Other Ambulatory Visit: Payer: Self-pay

## 2018-12-24 ENCOUNTER — Encounter (HOSPITAL_COMMUNITY): Payer: Self-pay | Admitting: Emergency Medicine

## 2018-12-24 DIAGNOSIS — R079 Chest pain, unspecified: Secondary | ICD-10-CM | POA: Diagnosis not present

## 2018-12-24 DIAGNOSIS — Z888 Allergy status to other drugs, medicaments and biological substances status: Secondary | ICD-10-CM

## 2018-12-24 DIAGNOSIS — Z800 Family history of malignant neoplasm of digestive organs: Secondary | ICD-10-CM

## 2018-12-24 DIAGNOSIS — I10 Essential (primary) hypertension: Secondary | ICD-10-CM | POA: Diagnosis present

## 2018-12-24 DIAGNOSIS — G4730 Sleep apnea, unspecified: Secondary | ICD-10-CM | POA: Diagnosis present

## 2018-12-24 DIAGNOSIS — I951 Orthostatic hypotension: Secondary | ICD-10-CM | POA: Diagnosis present

## 2018-12-24 DIAGNOSIS — K219 Gastro-esophageal reflux disease without esophagitis: Secondary | ICD-10-CM | POA: Diagnosis present

## 2018-12-24 DIAGNOSIS — F10930 Alcohol use, unspecified with withdrawal, uncomplicated: Secondary | ICD-10-CM

## 2018-12-24 DIAGNOSIS — F419 Anxiety disorder, unspecified: Secondary | ICD-10-CM | POA: Diagnosis present

## 2018-12-24 DIAGNOSIS — R0789 Other chest pain: Secondary | ICD-10-CM | POA: Diagnosis present

## 2018-12-24 DIAGNOSIS — K0510 Chronic gingivitis, plaque induced: Secondary | ICD-10-CM | POA: Diagnosis present

## 2018-12-24 DIAGNOSIS — Z79899 Other long term (current) drug therapy: Secondary | ICD-10-CM | POA: Diagnosis not present

## 2018-12-24 DIAGNOSIS — Z8711 Personal history of peptic ulcer disease: Secondary | ICD-10-CM | POA: Diagnosis not present

## 2018-12-24 DIAGNOSIS — R7989 Other specified abnormal findings of blood chemistry: Secondary | ICD-10-CM | POA: Diagnosis not present

## 2018-12-24 DIAGNOSIS — E860 Dehydration: Secondary | ICD-10-CM | POA: Diagnosis present

## 2018-12-24 DIAGNOSIS — M19079 Primary osteoarthritis, unspecified ankle and foot: Secondary | ICD-10-CM | POA: Diagnosis present

## 2018-12-24 DIAGNOSIS — R072 Precordial pain: Secondary | ICD-10-CM | POA: Diagnosis not present

## 2018-12-24 DIAGNOSIS — Z886 Allergy status to analgesic agent status: Secondary | ICD-10-CM

## 2018-12-24 DIAGNOSIS — E669 Obesity, unspecified: Secondary | ICD-10-CM | POA: Diagnosis present

## 2018-12-24 DIAGNOSIS — F17210 Nicotine dependence, cigarettes, uncomplicated: Secondary | ICD-10-CM | POA: Diagnosis present

## 2018-12-24 DIAGNOSIS — F10230 Alcohol dependence with withdrawal, uncomplicated: Secondary | ICD-10-CM

## 2018-12-24 DIAGNOSIS — F1010 Alcohol abuse, uncomplicated: Secondary | ICD-10-CM

## 2018-12-24 DIAGNOSIS — K029 Dental caries, unspecified: Secondary | ICD-10-CM | POA: Diagnosis present

## 2018-12-24 DIAGNOSIS — Z91018 Allergy to other foods: Secondary | ICD-10-CM | POA: Diagnosis not present

## 2018-12-24 DIAGNOSIS — R778 Other specified abnormalities of plasma proteins: Secondary | ICD-10-CM | POA: Diagnosis present

## 2018-12-24 DIAGNOSIS — Z6834 Body mass index (BMI) 34.0-34.9, adult: Secondary | ICD-10-CM

## 2018-12-24 DIAGNOSIS — Z880 Allergy status to penicillin: Secondary | ICD-10-CM | POA: Diagnosis not present

## 2018-12-24 DIAGNOSIS — F1020 Alcohol dependence, uncomplicated: Secondary | ICD-10-CM | POA: Diagnosis present

## 2018-12-24 DIAGNOSIS — I5041 Acute combined systolic (congestive) and diastolic (congestive) heart failure: Secondary | ICD-10-CM | POA: Diagnosis present

## 2018-12-24 DIAGNOSIS — Y902 Blood alcohol level of 40-59 mg/100 ml: Secondary | ICD-10-CM | POA: Diagnosis present

## 2018-12-24 DIAGNOSIS — I110 Hypertensive heart disease with heart failure: Secondary | ICD-10-CM | POA: Diagnosis present

## 2018-12-24 DIAGNOSIS — I426 Alcoholic cardiomyopathy: Secondary | ICD-10-CM | POA: Diagnosis present

## 2018-12-24 DIAGNOSIS — I5043 Acute on chronic combined systolic (congestive) and diastolic (congestive) heart failure: Secondary | ICD-10-CM | POA: Diagnosis not present

## 2018-12-24 DIAGNOSIS — K0889 Other specified disorders of teeth and supporting structures: Secondary | ICD-10-CM

## 2018-12-24 DIAGNOSIS — R531 Weakness: Secondary | ICD-10-CM

## 2018-12-24 DIAGNOSIS — F319 Bipolar disorder, unspecified: Secondary | ICD-10-CM | POA: Diagnosis present

## 2018-12-24 LAB — I-STAT TROPONIN, ED: Troponin i, poc: 0.14 ng/mL (ref 0.00–0.08)

## 2018-12-24 LAB — CBC
HCT: 49.3 % (ref 39.0–52.0)
Hemoglobin: 16 g/dL (ref 13.0–17.0)
MCH: 28.9 pg (ref 26.0–34.0)
MCHC: 32.5 g/dL (ref 30.0–36.0)
MCV: 89 fL (ref 80.0–100.0)
Platelets: 158 10*3/uL (ref 150–400)
RBC: 5.54 MIL/uL (ref 4.22–5.81)
RDW: 15.1 % (ref 11.5–15.5)
WBC: 5.1 10*3/uL (ref 4.0–10.5)
nRBC: 0 % (ref 0.0–0.2)

## 2018-12-24 LAB — BASIC METABOLIC PANEL
Anion gap: 15 (ref 5–15)
BUN: 7 mg/dL (ref 6–20)
CO2: 18 mmol/L — ABNORMAL LOW (ref 22–32)
Calcium: 9 mg/dL (ref 8.9–10.3)
Chloride: 106 mmol/L (ref 98–111)
Creatinine, Ser: 0.91 mg/dL (ref 0.61–1.24)
GFR calc Af Amer: >60 mL/min (ref >60)
Glucose, Bld: 93 mg/dL (ref 70–99)
Potassium: 4.5 mmol/L (ref 3.5–5.1)
Sodium: 139 mmol/L (ref 135–145)

## 2018-12-24 LAB — D-DIMER, QUANTITATIVE (NOT AT ARMC): D DIMER QUANT: 0.32 ug{FEU}/mL (ref 0.00–0.50)

## 2018-12-24 LAB — TROPONIN I: Troponin I: <0.03 ng/mL (ref <0.03)

## 2018-12-24 LAB — ETHANOL: Alcohol, Ethyl (B): 47 mg/dL — ABNORMAL HIGH (ref <10)

## 2018-12-24 MED ORDER — AMLODIPINE BESYLATE 10 MG PO TABS
10.0000 mg | ORAL_TABLET | Freq: Every day | ORAL | Status: DC
  Administered 2018-12-25: 10 mg via ORAL
  Filled 2018-12-24: qty 1

## 2018-12-24 MED ORDER — ACETAMINOPHEN 325 MG PO TABS
650.0000 mg | ORAL_TABLET | ORAL | Status: DC | PRN
  Administered 2018-12-25 – 2018-12-28 (×4): 650 mg via ORAL
  Filled 2018-12-24 (×4): qty 2

## 2018-12-24 MED ORDER — HEPARIN BOLUS VIA INFUSION
4000.0000 [IU] | Freq: Once | INTRAVENOUS | Status: AC
  Administered 2018-12-25: 4000 [IU] via INTRAVENOUS
  Filled 2018-12-24: qty 4000

## 2018-12-24 MED ORDER — SODIUM CHLORIDE 0.9% FLUSH: 3.0000 mL | Freq: Once | INTRAVENOUS | Status: DC

## 2018-12-24 MED ORDER — SODIUM CHLORIDE 0.9 % IV BOLUS
1000.0000 mL | Freq: Once | INTRAVENOUS | Status: AC
  Administered 2018-12-24: 1000 mL via INTRAVENOUS

## 2018-12-24 MED ORDER — AMLODIPINE BESYLATE 5 MG PO TABS
10.0000 mg | ORAL_TABLET | Freq: Once | ORAL | Status: AC
  Administered 2018-12-24: 10 mg via ORAL
  Filled 2018-12-24: qty 2

## 2018-12-24 MED ORDER — ONDANSETRON HCL 4 MG/2ML IJ SOLN: 4.0000 mg | Freq: Four times a day (QID) | INTRAMUSCULAR | Status: DC | PRN

## 2018-12-24 MED ORDER — PANTOPRAZOLE SODIUM 40 MG PO TBEC
40.0000 mg | DELAYED_RELEASE_TABLET | Freq: Two times a day (BID) | ORAL | Status: DC
  Administered 2018-12-24 – 2018-12-28 (×8): 40 mg via ORAL
  Filled 2018-12-24 (×8): qty 1

## 2018-12-24 MED ORDER — LORAZEPAM 2 MG/ML IJ SOLN
1.0000 mg | Freq: Once | INTRAMUSCULAR | Status: AC
  Administered 2018-12-24: 1 mg via INTRAVENOUS
  Filled 2018-12-24: qty 1

## 2018-12-24 MED ORDER — LORAZEPAM 2 MG/ML IJ SOLN
INTRAMUSCULAR | Status: AC
  Filled 2018-12-24: qty 1

## 2018-12-24 MED ORDER — ADULT MULTIVITAMIN W/MINERALS CH
1.0000 | ORAL_TABLET | Freq: Every day | ORAL | Status: DC
  Administered 2018-12-25 – 2018-12-28 (×4): 1 via ORAL
  Filled 2018-12-24 (×3): qty 1

## 2018-12-24 MED ORDER — BENZOCAINE 10 % MT GEL
Freq: Four times a day (QID) | OROMUCOSAL | Status: DC | PRN
  Administered 2018-12-25 (×3): via OROMUCOSAL
  Administered 2018-12-25 – 2018-12-26 (×2): 1 via OROMUCOSAL
  Administered 2018-12-26 (×2): via OROMUCOSAL
  Filled 2018-12-24 (×2): qty 9

## 2018-12-24 MED ORDER — LORAZEPAM 1 MG PO TABS
1.0000 mg | ORAL_TABLET | Freq: Four times a day (QID) | ORAL | Status: AC | PRN
  Administered 2018-12-25 – 2018-12-27 (×5): 1 mg via ORAL
  Filled 2018-12-24 (×5): qty 1

## 2018-12-24 MED ORDER — DULOXETINE HCL 30 MG PO CPEP
30.0000 mg | ORAL_CAPSULE | Freq: Every day | ORAL | Status: DC
  Administered 2018-12-25 – 2018-12-28 (×4): 30 mg via ORAL
  Filled 2018-12-24 (×4): qty 1

## 2018-12-24 MED ORDER — FOLIC ACID 1 MG PO TABS
1.0000 mg | ORAL_TABLET | Freq: Every day | ORAL | Status: DC
  Administered 2018-12-25 – 2018-12-28 (×4): 1 mg via ORAL
  Filled 2018-12-24 (×4): qty 1

## 2018-12-24 MED ORDER — HEPARIN (PORCINE) 25000 UT/250ML-% IV SOLN
1300.0000 [IU]/h | INTRAVENOUS | Status: DC
  Administered 2018-12-25 – 2018-12-26 (×3): 1250 [IU]/h via INTRAVENOUS
  Administered 2018-12-27: 1300 [IU]/h via INTRAVENOUS
  Filled 2018-12-24 (×4): qty 250

## 2018-12-24 MED ORDER — ATORVASTATIN CALCIUM 40 MG PO TABS
40.0000 mg | ORAL_TABLET | Freq: Every day | ORAL | Status: DC
  Administered 2018-12-24 – 2018-12-27 (×4): 40 mg via ORAL
  Filled 2018-12-24 (×4): qty 1

## 2018-12-24 MED ORDER — ASPIRIN 81 MG PO CHEW
324.0000 mg | CHEWABLE_TABLET | Freq: Once | ORAL | Status: AC
  Administered 2018-12-24: 324 mg via ORAL
  Filled 2018-12-24: qty 4

## 2018-12-24 MED ORDER — VITAMIN B-1 100 MG PO TABS
100.0000 mg | ORAL_TABLET | Freq: Every day | ORAL | Status: DC
  Administered 2018-12-25 – 2018-12-28 (×4): 100 mg via ORAL
  Filled 2018-12-24 (×4): qty 1

## 2018-12-24 MED ORDER — LORAZEPAM 2 MG/ML IJ SOLN: 1.0000 mg | Freq: Four times a day (QID) | INTRAMUSCULAR | Status: AC | PRN

## 2018-12-24 MED ORDER — THIAMINE HCL 100 MG/ML IJ SOLN: 100.0000 mg | Freq: Every day | INTRAMUSCULAR | Status: DC
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
## 2018-12-24 NOTE — Progress Notes (Signed)
ANTICOAGULATION CONSULT NOTE - Initial Consult  Pharmacy Consult for heparin Indication: chest pain/ACS  Allergies  Allergen Reactions  . Uncoded Nonscreenable Allergen Other (See Comments)    Sun tan lotion: swelling and peeling  . Acetaminophen Other (See Comments)    Make he side hurt; he stated he has liver damage.   . Ibuprofen Swelling    Swelling and discomfort of stomach  . Nsaids Other (See Comments)    Swelling and discomfort of stomach  . Penicillins Itching, Swelling and Rash    Has patient had a PCN reaction causing immediate rash, facial/tongue/throat swelling, SOB or lightheadedness with hypotension: yes Has patient had a PCN reaction causing severe rash involving mucus membranes or skin necrosis: no Has patient had a PCN reaction that required hospitalization yes, happened while hospitalized Has patient had a PCN reaction occurring within the last 10 years: yes If all of the above answers are "NO", then may proceed with Cephalosporin use.   . [name] Other (See Comments)    DOESN'T EAT PORK-patient preference    Patient Measurements: Height: 5' 10"  (177.8 cm) Weight: 250 lb (113.4 kg) IBW/kg (Calculated) : 73 Heparin Dosing Weight: 97.9 kg  Vital Signs: Temp: 98.5 F (36.9 C) (03/06 1420) Temp Source: Oral (03/06 1420) BP: 165/107 (03/06 2001) Pulse Rate: 114 (03/06 2001)  Labs: Recent Labs    12/24/18 1917  HGB 16.0  HCT 49.3  PLT 158  CREATININE 0.91    Estimated Creatinine Clearance: 118.4 mL/min (by C-G formula based on SCr of 0.91 mg/dL).   Medical History: Past Medical History:  Diagnosis Date  . Acid reflux   . Anxiety   . Arthritis    "toes" (07/26/2014)  . Bipolar disorder ([place])   . Depression   . XBDZHGDJ(242.6)    "weekly" (07/26/2014)  . History of blood transfusion ~ 2000   "related to nose bleeding"  . History of stomach ulcers   . Hypertension   . Lower GI bleeding admitted 07/26/2014  . Mental disorder   .  Migraine    "@ least monthly" (07/26/2014)  . Pancreatitis   . Rectal bleeding 07/26/2014  . Sleep apnea    "haven't been RX'd mask yet" (07/26/2014)    Medications:  Scheduled:  . amLODipine  10 mg Oral Once  . [START ON 12/25/2018] amLODipine  10 mg Oral QHS  . atorvastatin  40 mg Oral QHS  . [START ON 12/25/2018] DULoxetine  30 mg Oral Daily  . folic acid  1 mg Oral Daily  . heparin  4,000 Units Intravenous Once  . multivitamin with minerals  1 tablet Oral Daily  . pantoprazole  40 mg Oral BID  . sodium chloride flush  3 mL Intravenous Once  . thiamine  100 mg Oral Daily   Or  . thiamine  100 mg Intravenous Daily   Infusions:  . heparin    . sodium chloride      Assessment: 8 yoM admitted 3/6 with 4 day history of mild central chest pain. Troponin 0.14. Pharmacy consulted to initiate heparin infusion. Patient not on anticoagulation PTA. Baseline CBC stable, Hgb 16, HCT 49.3, pltc 158 (noted decrease from previous lab result on 12/14/18 with pltc of 332).  Goal of Therapy:  Heparin level 0.3-0.7 units/ml Monitor platelets by anticoagulation protocol: Yes   Plan:  Give 4000 units bolus x 1 Start heparin infusion at 1250 units/hr Check anti-Xa level in 6 hours and daily while on heparin Continue to monitor H&H  and platelets  Thank you for allowing pharmacy to be a part of this patient's care.  Leron Croak, PharmD PGY1 Pharmacy Resident Please check AMION for all [facility] Pharmacy phone numbers 12/24/2018,8:46 PM

## 2018-12-24 NOTE — Progress Notes (Addendum)
Patient with normal, unlabored breathing, normal rate (he is not breathing at a rate of 100, this is an entry error).

## 2018-12-24 NOTE — ED Triage Notes (Signed)
Pt reports for the past 5 days he has been feeling like he is going to pass out, feels weak, and feels like his heart is having palpitations.
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
## 2018-12-24 NOTE — ED Notes (Signed)
Nurse collecting labs.
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
## 2018-12-24 NOTE — ED Notes (Signed)
Dr Ashok Cordia informed of troponin results .[place]

## 2018-12-25 LAB — CBC
HCT: 40.4 % (ref 39.0–52.0)
Hemoglobin: 13.2 g/dL (ref 13.0–17.0)
MCH: 28.3 pg (ref 26.0–34.0)
MCHC: 32.7 g/dL (ref 30.0–36.0)
MCV: 86.7 fL (ref 80.0–100.0)
Platelets: 125 10*3/uL — ABNORMAL LOW (ref 150–400)
RBC: 4.66 MIL/uL (ref 4.22–5.81)
RDW: 14.7 % (ref 11.5–15.5)
WBC: 6.4 10*3/uL (ref 4.0–10.5)
nRBC: 0 % (ref 0.0–0.2)

## 2018-12-25 LAB — HIV ANTIBODY (ROUTINE TESTING W REFLEX): HIV Screen 4th Generation wRfx: NONREACTIVE

## 2018-12-25 LAB — HEPARIN LEVEL (UNFRACTIONATED)
Heparin Unfractionated: 0.46 IU/mL (ref 0.30–0.70)
Heparin Unfractionated: 0.51 IU/mL (ref 0.30–0.70)

## 2018-12-25 LAB — TROPONIN I
Troponin I: <0.03 ng/mL (ref <0.03)
Troponin I: 0.03 ng/mL (ref <0.03)
Troponin I: 0.03 ng/mL (ref <0.03)

## 2018-12-25 MED ORDER — TRAMADOL HCL 50 MG PO TABS
50.0000 mg | ORAL_TABLET | Freq: Four times a day (QID) | ORAL | Status: DC | PRN
  Administered 2018-12-25 – 2018-12-27 (×9): 50 mg via ORAL
  Filled 2018-12-25 (×10): qty 1

## 2018-12-25 NOTE — Progress Notes (Signed)
Pt identified as chest pain unit obs by nurse protocol to see ACS patients early to decide ischemic testing. H/o alcohol abuse, HTN, admitted with lightheadedness and dull chest pain constant for 4 days. POC troponin abnormal but f/u values negative x 2 so initial value likely spurious; EKG nonacute. Spoke with Dr. Domenic Polite; would recommend IM see/evaluate pt; please call if formal consultation needed. Relayed info to Dr. Marthenia Rolling. Earlee Herald PA-C

## 2018-12-25 NOTE — Progress Notes (Signed)
Critical Result - Troponin = 0.03. Called to Dr Marthenia Rolling.

## 2018-12-25 NOTE — Progress Notes (Signed)
PROGRESS NOTE    Jacob Adams  ZOX:[id number] DOB: 02-Jun-1966 DOA: 12/24/2018 PCP: Patient, No Pcp Per  Outpatient Specialists:   Brief Narrative:  Jacob Adams is a 53 y.o. male with medical history significant of EtOH abuse and hypertension.  Patient presents to the ED with 4-5 day history of chest pain, said to be constant, right-sided with.  With radiation to the jaw.  At the peak of chest pain, was reported to be 10 out of 10.  Not clear if chest pain is reproducible.  There is also documentation of history of lightheadedness that is said to be worsened standing.  Patient reported 2 prior cardiac catheterization.  We will continue to cycle cardiac enzymes.  We will also get an echocardiogram.  Further management will depend on hospital course.    Assessment & Plan:   Principal Problem:   Elevated troponin Active Problems:   Alcohol use disorder, severe, dependence (HCC)   Chest pain   HTN (hypertension)  Chest pain: Continue to cycle cardiac enzymes. Echocardiogram. D-dimer is negative Continue heparin Low threshold to consult cardiology Further management depend on hospital course.  Hypertension: Continue to optimize.  Alcohol abuse: Continue CIWA protocol.  DVT prophylaxis: Heparin GTT Code Status: Full Family Communication:  Disposition Plan: This will depend on hospital course.   Consultants:   Low threshold to consult psychiatry   Procedures:   None  Antimicrobials:   None   Subjective: Vague chest pain is improving.  Objective: Vitals:   12/24/18 2215 12/25/18 0028 12/25/18 0452 12/25/18 1249  BP: 122/85 (!) 151/85 (!) 161/99 (!) 151/105  Pulse: (!) 123 (!) 130 (!) 102 91  Resp: 19 18 20    Temp:  98.5 F (36.9 C) 98.5 F (36.9 C) 98.2 F (36.8 C)  TempSrc:    Oral  SpO2: 97% 97% 97% 98%  Weight:  106.1 kg    Height:  5\' 10"  (1.778 m)      Intake/Output Summary (Last 24 hours) at 12/25/2018 1652 Last data filed at 12/25/2018 1140 Gross per 24  hour  Intake 2000 ml  Output 1000 ml  Net 1000 ml   Filed Weights   12/24/18 1452 12/25/18 0028  Weight: 113.4 kg 106.1 kg    Examination:  General exam: Appears calm and comfortable  Respiratory system: Clear to auscultation.  Cardiovascular system: S1 & S2 heard Gastrointestinal system: Abdomen is nondistended, soft and nontender. No organomegaly or masses felt. Normal bowel sounds heard. Central nervous system: Alert and oriented.  Patient moves all limbs. Extremities: No leg edema.  Data Reviewed: I have personally reviewed following labs and imaging studies  CBC: Recent Labs  Lab 12/24/18 1917 12/25/18 0832  WBC 5.1 6.4  HGB 16.0 13.2  HCT 49.3 40.4  MCV 89.0 86.7  PLT 158 125*   Basic Metabolic Panel: Recent Labs  Lab 12/24/18 1917  NA 139  K 4.5  CL 106  CO2 18*  GLUCOSE 93  BUN 7  CREATININE 0.91  CALCIUM 9.0   GFR: Estimated Creatinine Clearance: 114.5 mL/min (by C-G formula based on SCr of 0.91 mg/dL). Liver Function Tests: No results for input(s): AST, ALT, ALKPHOS, BILITOT, PROT, ALBUMIN in the last 168 hours. No results for input(s): LIPASE, AMYLASE in the last 168 hours. No results for input(s): AMMONIA in the last 168 hours. Coagulation Profile: No results for input(s): INR, PROTIME in the last 168 hours. Cardiac Enzymes: Recent Labs  Lab 12/24/18 2206 12/25/18 0309 12/25/18 8119 12/25/18 1352  TROPONINI <0.03 <0.03 0.03* 0.03*   BNP (last 3 results) No results for input(s): PROBNP in the last 8760 hours. HbA1C: No results for input(s): HGBA1C in the last 72 hours. CBG: No results for input(s): GLUCAP in the last 168 hours. Lipid Profile: No results for input(s): CHOL, HDL, LDLCALC, TRIG, CHOLHDL, LDLDIRECT in the last 72 hours. Thyroid Function Tests: No results for input(s): TSH, T4TOTAL, FREET4, T3FREE, THYROIDAB in the last 72 hours. Anemia Panel: No results for input(s): VITAMINB12, FOLATE, FERRITIN, TIBC, IRON, RETICCTPCT  in the last 72 hours. Urine analysis:    Component Value Date/Time   COLORURINE STRAW (A) 12/14/2018 1924   APPEARANCEUR CLEAR 12/14/2018 1924   LABSPEC 1.005 12/14/2018 1924   PHURINE 5.0 12/14/2018 1924   GLUCOSEU NEGATIVE 12/14/2018 1924   HGBUR SMALL (A) 12/14/2018 1924   BILIRUBINUR NEGATIVE 12/14/2018 1924   KETONESUR NEGATIVE 12/14/2018 1924   PROTEINUR NEGATIVE 12/14/2018 1924   UROBILINOGEN 0.2 08/25/2013 2023   NITRITE NEGATIVE 12/14/2018 1924   LEUKOCYTESUR NEGATIVE 12/14/2018 1924   Sepsis Labs: @LABRCNTIP (procalcitonin:4,lacticidven:4)  )No results found for this or any previous visit (from the past 240 hour(s)).       Radiology Studies: Dg Chest 2 View  Result Date: 12/24/2018 CLINICAL DATA:  Chest pain EXAM: CHEST - 2 VIEW COMPARISON:  05/24/2018 FINDINGS: Mild atelectasis at the right base. No acute consolidation or effusion. Normal cardiomediastinal silhouette. No pneumothorax. IMPRESSION: No active cardiopulmonary disease. Mild atelectasis at the right base Electronically Signed   By: Jasmine Pang M.D.   On: 12/24/2018 15:39        Scheduled Meds: . amLODipine  10 mg Oral QHS  . atorvastatin  40 mg Oral QHS  . DULoxetine  30 mg Oral Daily  . folic acid  1 mg Oral Daily  . multivitamin with minerals  1 tablet Oral Daily  . pantoprazole  40 mg Oral BID  . sodium chloride flush  3 mL Intravenous Once  . thiamine  100 mg Oral Daily   Or  . thiamine  100 mg Intravenous Daily   Continuous Infusions: . heparin 1,250 Units/hr (12/25/18 0053)     LOS: 1 day   Berton Mount, MD  [facility] Pager #: [phone] 7PM-7AM contact night coverage as above

## 2018-12-25 NOTE — Progress Notes (Signed)
[place] for heparin Indication: chest pain/ACS  Allergies  Allergen Reactions  . Uncoded Nonscreenable Allergen Other (See Comments)    Sun tan lotion: swelling and peeling  . Acetaminophen Other (See Comments)    Make he side hurt; he stated he has liver damage.   . Ibuprofen Swelling    Swelling and discomfort of stomach  . Nsaids Other (See Comments)    Swelling and discomfort of stomach  . Penicillins Itching, Swelling and Rash    Has patient had a PCN reaction causing immediate rash, facial/tongue/throat swelling, SOB or lightheadedness with hypotension: yes Has patient had a PCN reaction causing severe rash involving mucus membranes or skin necrosis: no Has patient had a PCN reaction that required hospitalization yes, happened while hospitalized Has patient had a PCN reaction occurring within the last 10 years: yes If all of the above answers are "NO", then may proceed with Cephalosporin use.   . [name] Other (See Comments)    DOESN'T EAT PORK-patient preference    Patient Measurements: Height: 5' 10"  (177.8 cm) Weight: 233 lb 12.8 oz (106.1 kg) IBW/kg (Calculated) : 73 Heparin Dosing Weight: 97.9 kg  Vital Signs: Temp: 98.5 F (36.9 C) (03/07 0452) BP: 161/99 (03/07 0452) Pulse Rate: 102 (03/07 0452)  Labs: Recent Labs    12/24/18 1917 12/24/18 2206 12/25/18 0309 12/25/18 0832  HGB 16.0  --   --  13.2  HCT 49.3  --   --  40.4  PLT 158  --   --  125*  HEPARINUNFRC  --   --  0.46 0.51  CREATININE 0.91  --   --   --   TROPONINI  --  <0.03 <0.03 0.03*    Estimated Creatinine Clearance: 114.5 mL/min (by C-G formula based on SCr of 0.91 mg/dL).   Medical History: Past Medical History:  Diagnosis Date  . Acid reflux   . Anxiety   . Arthritis    "toes" (07/26/2014)  . Bipolar disorder ([place])   . Depression   . WUXLKGMW(102.7)    "weekly" (07/26/2014)  . History of blood transfusion ~ 2000   "related  to nose bleeding"  . History of stomach ulcers   . Hypertension   . Lower GI bleeding admitted 07/26/2014  . Mental disorder   . Migraine    "@ least monthly" (07/26/2014)  . Pancreatitis   . Rectal bleeding 07/26/2014  . Sleep apnea    "haven't been RX'd mask yet" (07/26/2014)    Medications:  Scheduled:  . amLODipine  10 mg Oral QHS  . atorvastatin  40 mg Oral QHS  . DULoxetine  30 mg Oral Daily  . folic acid  1 mg Oral Daily  . multivitamin with minerals  1 tablet Oral Daily  . pantoprazole  40 mg Oral BID  . sodium chloride flush  3 mL Intravenous Once  . thiamine  100 mg Oral Daily   Or  . thiamine  100 mg Intravenous Daily   Infusions:  . heparin 1,250 Units/hr (12/25/18 0053)    Assessment: 41 yoM admitted 3/6 with 4 day history of mild central chest pain. Troponin 0.14. Pharmacy consulted to initiate heparin infusion. Patient not on anticoagulation PTA.   Heparin level came back therapeutic at 0.51, on 1250 units/hr/ Hgb 13.2, plt 125. Troponin 0.03>0.14>0.03. No s/sx of bleeding. No infusion issues.  Goal of Therapy:  Heparin level 0.3-0.7 units/ml Monitor platelets by anticoagulation protocol: Yes   Plan:  Continue  heparin at 1250 units/hr  Check anti-Xa level daily while on heparin Continue to monitor H&H and platelets  Thank you for allowing pharmacy to be a part of this patient's care.  Antonietta Jewel, PharmD, [place] Clinical Pharmacist  Pager: [phone] Phone: [phone] Please check AMION for all [facility] phone numbers 12/25/2018,10:50 AM

## 2018-12-25 NOTE — Progress Notes (Signed)
ANTICOAGULATION CONSULT NOTE - Follow Up Consult  Pharmacy Consult for heparin Indication: chest pain/ACS   Labs: Recent Labs    12/24/18 1917 12/24/18 2206 12/25/18 0309  HGB 16.0  --   --   HCT 49.3  --   --   PLT 158  --   --   HEPARINUNFRC  --   --  0.46  CREATININE 0.91  --   --   TROPONINI  --  <0.03  --     Assessment/Plan:  53yo male therapeutic on heparin with initial dosing for CP. Will continue gtt at current rate and confirm stable with additional level.   Wynona Neat, PharmD, BCPS  12/25/2018,4:51 AM

## 2018-12-25 NOTE — Plan of Care (Signed)
  Problem: Clinical Measurements: Goal: Ability to maintain clinical measurements within normal limits will improve Outcome: Progressing   Problem: Clinical Measurements: Goal: Ability to maintain clinical measurements within normal limits will improve Outcome: Progressing

## 2018-12-26 ENCOUNTER — Inpatient Hospital Stay (HOSPITAL_COMMUNITY): Payer: Medicaid Other

## 2018-12-26 DIAGNOSIS — I5043 Acute on chronic combined systolic (congestive) and diastolic (congestive) heart failure: Secondary | ICD-10-CM

## 2018-12-26 DIAGNOSIS — R079 Chest pain, unspecified: Secondary | ICD-10-CM

## 2018-12-26 LAB — CBC
HCT: 42.9 % (ref 39.0–52.0)
Hemoglobin: 14.4 g/dL (ref 13.0–17.0)
MCH: 29.1 pg (ref 26.0–34.0)
MCHC: 33.6 g/dL (ref 30.0–36.0)
MCV: 86.8 fL (ref 80.0–100.0)
Platelets: 144 10*3/uL — ABNORMAL LOW (ref 150–400)
RBC: 4.94 MIL/uL (ref 4.22–5.81)
RDW: 14.5 % (ref 11.5–15.5)
WBC: 6.1 10*3/uL (ref 4.0–10.5)
nRBC: 0 % (ref 0.0–0.2)

## 2018-12-26 LAB — HEPARIN LEVEL (UNFRACTIONATED): Heparin Unfractionated: 0.36 IU/mL (ref 0.30–0.70)

## 2018-12-26 LAB — ECHOCARDIOGRAM COMPLETE
Height: 70 in
Weight: 3708.8 oz

## 2018-12-26 MED ORDER — SODIUM CHLORIDE 0.9% FLUSH
3.0000 mL | Freq: Two times a day (BID) | INTRAVENOUS | Status: DC
  Administered 2018-12-27: 3 mL via INTRAVENOUS

## 2018-12-26 MED ORDER — ALUM & MAG HYDROXIDE-SIMETH 200-200-20 MG/5ML PO SUSP
30.0000 mL | ORAL | Status: DC | PRN
  Filled 2018-12-26: qty 30

## 2018-12-26 MED ORDER — SODIUM CHLORIDE 0.9 % IV SOLN: 250.0000 mL | INTRAVENOUS | Status: DC | PRN

## 2018-12-26 MED ORDER — CARVEDILOL 3.125 MG PO TABS: 3.1250 mg | ORAL_TABLET | Freq: Two times a day (BID) | ORAL | Status: DC

## 2018-12-26 MED ORDER — SPIRONOLACTONE 25 MG PO TABS
25.0000 mg | ORAL_TABLET | Freq: Every day | ORAL | Status: DC
  Administered 2018-12-26 – 2018-12-28 (×2): 25 mg via ORAL
  Filled 2018-12-26 (×3): qty 1

## 2018-12-26 MED ORDER — ASPIRIN EC 81 MG PO TBEC
81.0000 mg | DELAYED_RELEASE_TABLET | Freq: Every day | ORAL | Status: DC
  Administered 2018-12-28: 81 mg via ORAL
  Filled 2018-12-26 (×2): qty 1

## 2018-12-26 MED ORDER — LISINOPRIL 20 MG PO TABS
20.0000 mg | ORAL_TABLET | Freq: Every day | ORAL | Status: DC
  Administered 2018-12-26 – 2018-12-27 (×2): 20 mg via ORAL
  Filled 2018-12-26 (×2): qty 1

## 2018-12-26 MED ORDER — CARVEDILOL 12.5 MG PO TABS
12.5000 mg | ORAL_TABLET | Freq: Two times a day (BID) | ORAL | Status: DC
  Administered 2018-12-26 – 2018-12-28 (×4): 12.5 mg via ORAL
  Filled 2018-12-26 (×4): qty 1

## 2018-12-26 MED ORDER — SODIUM CHLORIDE 0.9% FLUSH: 3.0000 mL | INTRAVENOUS | Status: DC | PRN

## 2018-12-26 MED ORDER — ASPIRIN 81 MG PO CHEW
81.0000 mg | CHEWABLE_TABLET | ORAL | Status: AC
  Administered 2018-12-27: 81 mg via ORAL
  Filled 2018-12-26: qty 1

## 2018-12-26 MED ORDER — SODIUM CHLORIDE 0.9 % IV SOLN
INTRAVENOUS | Status: DC
  Administered 2018-12-27: 05:00:00 via INTRAVENOUS

## 2018-12-26 NOTE — Progress Notes (Signed)
[place] for heparin Indication: chest pain/ACS  Allergies  Allergen Reactions  . Uncoded Nonscreenable Allergen Other (See Comments)    Sun tan lotion: swelling and peeling  . Acetaminophen Other (See Comments)    Make he side hurt; he stated he has liver damage.   . Ibuprofen Swelling    Swelling and discomfort of stomach  . Nsaids Other (See Comments)    Swelling and discomfort of stomach  . Penicillins Itching, Swelling and Rash    Has patient had a PCN reaction causing immediate rash, facial/tongue/throat swelling, SOB or lightheadedness with hypotension: yes Has patient had a PCN reaction causing severe rash involving mucus membranes or skin necrosis: no Has patient had a PCN reaction that required hospitalization yes, happened while hospitalized Has patient had a PCN reaction occurring within the last 10 years: yes If all of the above answers are "NO", then may proceed with Cephalosporin use.   . [name] Other (See Comments)    DOESN'T EAT PORK-patient preference    Patient Measurements: Height: 5' 10"  (177.8 cm) Weight: 231 lb 12.8 oz (105.1 kg) IBW/kg (Calculated) : 73 Heparin Dosing Weight: 97.9 kg  Vital Signs: Temp: 98.7 F (37.1 C) (03/08 0541) Temp Source: Oral (03/08 0541) BP: 158/108 (03/08 0541) Pulse Rate: 110 (03/08 1005)  Labs: Recent Labs    12/24/18 1917  12/25/18 0309 12/25/18 0832 12/25/18 1352 12/26/18 0510  HGB 16.0  --   --  13.2  --  14.4  HCT 49.3  --   --  40.4  --  42.9  PLT 158  --   --  125*  --  144*  HEPARINUNFRC  --   --  0.46 0.51  --  0.36  CREATININE 0.91  --   --   --   --   --   TROPONINI  --    < > <0.03 0.03* 0.03*  --    < > = values in this interval not displayed.    Estimated Creatinine Clearance: 113.9 mL/min (by C-G formula based on SCr of 0.91 mg/dL).   Medical History: Past Medical History:  Diagnosis Date  . Acid reflux   . Anxiety   . Arthritis    "toes" (07/26/2014)  . Bipolar disorder ([place])   . Depression   . URKYHCWC(376.2)    "weekly" (07/26/2014)  . History of blood transfusion ~ 2000   "related to nose bleeding"  . History of stomach ulcers   . Hypertension   . Lower GI bleeding admitted 07/26/2014  . Mental disorder   . Migraine    "@ least monthly" (07/26/2014)  . Pancreatitis   . Rectal bleeding 07/26/2014  . Sleep apnea    "haven't been RX'd mask yet" (07/26/2014)    Medications:  Scheduled:  . amLODipine  10 mg Oral QHS  . atorvastatin  40 mg Oral QHS  . DULoxetine  30 mg Oral Daily  . folic acid  1 mg Oral Daily  . multivitamin with minerals  1 tablet Oral Daily  . pantoprazole  40 mg Oral BID  . sodium chloride flush  3 mL Intravenous Once  . thiamine  100 mg Oral Daily   Or  . thiamine  100 mg Intravenous Daily   Infusions:  . heparin 1,250 Units/hr (12/26/18 1008)    Assessment: 39 yoM admitted 3/6 with 4 day history of mild central chest pain. Troponin 0.14. Pharmacy consulted to initiate heparin infusion. Patient not on  anticoagulation PTA.   Heparin level came back therapeutic at 0.36, on 1250 units/hr. Hgb 14.4, plt 144. Troponin 0.03>0.14>0.03. No s/sx of bleeding. No infusion issues.  Goal of Therapy:  Heparin level 0.3-0.7 units/ml Monitor platelets by anticoagulation protocol: Yes   Plan:  Increase heparin slightly to 1300 units/hr to keep in goal range since level trending down Check anti-Xa level daily while on heparin Continue to monitor H&H and platelets  Thank you for allowing pharmacy to be a part of this patient's care.  Antonietta Jewel, PharmD, [place] Clinical Pharmacist  Pager: [phone] Phone: [phone] Please check AMION for all [place] phone numbers 12/26/2018,12:50 PM

## 2018-12-26 NOTE — Progress Notes (Signed)
PROGRESS NOTE    Jacob Adams  ZOX:[id number] DOB: 1966-07-01 DOA: 12/24/2018 PCP: Patient, No Pcp Per  Outpatient Specialists:   Brief Narrative:  Jacob Adams is a 53 y.o. male with medical history significant of EtOH abuse and hypertension.  Patient presents to the ED with 4-5 day history of chest pain, said to be constant, right-sided with.  With radiation to the jaw.  At the peak of chest pain, was reported to be 10 out of 10.  Not clear if chest pain is reproducible.  There is also documentation of history of lightheadedness that is said to be worsened standing.  Patient reported 2 prior cardiac catheterization.  We will continue to cycle cardiac enzymes.  We will also get an echocardiogram.  Further management will depend on hospital course.    12/26/2018: Chest pain and lightheadedness have improved.  Troponin trended, but remained flat.  However, echocardiogram revealed EF of 25%.  Will consult cardiology.  Further management will depend on hospital course.  Assessment & Plan:   Principal Problem:   Elevated troponin Active Problems:   Alcohol use disorder, severe, dependence (HCC)   Chest pain   HTN (hypertension)  Chest pain: Continue to cycle cardiac enzymes. Echocardiogram. D-dimer is negative Continue heparin Low threshold to consult cardiology Further management depend on hospital course. 12/26/2018: Troponin trended, and remained flat.  Chest pain is improved.  Echocardiogram revealed EF of 25%.  Documented allergy to NSAIDs.  Cardiomyopathy: EF is 25%. Consult cardiology. Rule out ischemia. Possibly related to alcohol versus tachycardia induced cardiomyopathy. Further management depend on hospital course.  Hypertension: Start Coreg.   Start lisinopril.   Low threshold to introduce Aldactone, but will defer to cardiology.   Continue to optimize.  Alcohol abuse: Continue CIWA protocol.  DVT prophylaxis: Heparin GTT Code Status: Full Family Communication:    Disposition Plan: This will depend on hospital course.   Consultants:   Cardiology team  Procedures:   None  Antimicrobials:   None   Subjective: Vague chest pain is improving.  Objective: Vitals:   12/26/18 0000 12/26/18 0541 12/26/18 1005 12/26/18 1408  BP: (!) 148/102 (!) 158/108  (!) 150/99  Pulse: 94 (!) 111 (!) 110 (!) 101  Resp:  16 20   Temp:  98.7 F (37.1 C)  99.1 F (37.3 C)  TempSrc:  Oral  Oral  SpO2:  97%  96%  Weight:  105.1 kg    Height:        Intake/Output Summary (Last 24 hours) at 12/26/2018 1513 Last data filed at 12/26/2018 0900 Gross per 24 hour  Intake 1060.92 ml  Output 525 ml  Net 535.92 ml   Filed Weights   12/24/18 1452 12/25/18 0028 12/26/18 0541  Weight: 113.4 kg 106.1 kg 105.1 kg    Examination:  General exam: Appears calm and comfortable  Respiratory system: Clear to auscultation.  Cardiovascular system: S1 & S2 heard Gastrointestinal system: Abdomen is nondistended, soft and nontender. No organomegaly or masses felt. Normal bowel sounds heard. Central nervous system: Alert and oriented.  Patient moves all limbs. Extremities: No leg edema.  Data Reviewed: I have personally reviewed following labs and imaging studies  CBC: Recent Labs  Lab 12/24/18 1917 12/25/18 0832 12/26/18 0510  WBC 5.1 6.4 6.1  HGB 16.0 13.2 14.4  HCT 49.3 40.4 42.9  MCV 89.0 86.7 86.8  PLT 158 125* 144*   Basic Metabolic Panel: Recent Labs  Lab 12/24/18 1917  NA 139  K 4.5  CL 106  CO2 18*  GLUCOSE 93  BUN 7  CREATININE 0.91  CALCIUM 9.0   GFR: Estimated Creatinine Clearance: 113.9 mL/min (by C-G formula based on SCr of 0.91 mg/dL). Liver Function Tests: No results for input(s): AST, ALT, ALKPHOS, BILITOT, PROT, ALBUMIN in the last 168 hours. No results for input(s): LIPASE, AMYLASE in the last 168 hours. No results for input(s): AMMONIA in the last 168 hours. Coagulation Profile: No results for input(s): INR, PROTIME in the  last 168 hours. Cardiac Enzymes: Recent Labs  Lab 12/24/18 2206 12/25/18 0309 12/25/18 0832 12/25/18 1352  TROPONINI <0.03 <0.03 0.03* 0.03*   BNP (last 3 results) No results for input(s): PROBNP in the last 8760 hours. HbA1C: No results for input(s): HGBA1C in the last 72 hours. CBG: No results for input(s): GLUCAP in the last 168 hours. Lipid Profile: No results for input(s): CHOL, HDL, LDLCALC, TRIG, CHOLHDL, LDLDIRECT in the last 72 hours. Thyroid Function Tests: No results for input(s): TSH, T4TOTAL, FREET4, T3FREE, THYROIDAB in the last 72 hours. Anemia Panel: No results for input(s): VITAMINB12, FOLATE, FERRITIN, TIBC, IRON, RETICCTPCT in the last 72 hours. Urine analysis:    Component Value Date/Time   COLORURINE STRAW (A) 12/14/2018 1924   APPEARANCEUR CLEAR 12/14/2018 1924   LABSPEC 1.005 12/14/2018 1924   PHURINE 5.0 12/14/2018 1924   GLUCOSEU NEGATIVE 12/14/2018 1924   HGBUR SMALL (A) 12/14/2018 1924   BILIRUBINUR NEGATIVE 12/14/2018 1924   KETONESUR NEGATIVE 12/14/2018 1924   PROTEINUR NEGATIVE 12/14/2018 1924   UROBILINOGEN 0.2 08/25/2013 2023   NITRITE NEGATIVE 12/14/2018 1924   LEUKOCYTESUR NEGATIVE 12/14/2018 1924   Sepsis Labs: @LABRCNTIP (procalcitonin:4,lacticidven:4)  )No results found for this or any previous visit (from the past 240 hour(s)).       Radiology Studies: Dg Chest 2 View  Result Date: 12/24/2018 CLINICAL DATA:  Chest pain EXAM: CHEST - 2 VIEW COMPARISON:  05/24/2018 FINDINGS: Mild atelectasis at the right base. No acute consolidation or effusion. Normal cardiomediastinal silhouette. No pneumothorax. IMPRESSION: No active cardiopulmonary disease. Mild atelectasis at the right base Electronically Signed   By: Jasmine Pang M.D.   On: 12/24/2018 15:39        Scheduled Meds: . amLODipine  10 mg Oral QHS  . atorvastatin  40 mg Oral QHS  . carvedilol  3.125 mg Oral BID WC  . DULoxetine  30 mg Oral Daily  . folic acid  1 mg Oral  Daily  . lisinopril  20 mg Oral Daily  . multivitamin with minerals  1 tablet Oral Daily  . pantoprazole  40 mg Oral BID  . sodium chloride flush  3 mL Intravenous Once  . thiamine  100 mg Oral Daily   Or  . thiamine  100 mg Intravenous Daily   Continuous Infusions: . heparin 1,300 Units/hr (12/26/18 1259)     LOS: 2 days   Berton Mount, MD  [facility] Pager #: [phone] 7PM-7AM contact night coverage as above

## 2018-12-26 NOTE — Progress Notes (Signed)
  Echocardiogram 2D Echocardiogram has been performed.  Jacob Adams 12/26/2018, 11:15 AM
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
## 2018-12-26 NOTE — Plan of Care (Signed)
  Problem: Clinical Measurements: Goal: Respiratory complications will improve Outcome: Progressing Goal: Cardiovascular complication will be avoided Outcome: Progressing   

## 2018-12-26 NOTE — Progress Notes (Addendum)
   Per discussion with Dr. Meda Coffee, she discussed plan with patient for [facility] tomorrow with gentle IV fluids at 50cc/hr in the AM. I will list him on add-on board. She recommends to optimize meds for low EF by stopping amlodipine and starting carvedilol 12.62m BID given high BP; she agrees with IM plan for lisinopril. Will also add ASA. Full note to follow. Bryanne Riquelme PA-C

## 2018-12-27 ENCOUNTER — Encounter (HOSPITAL_COMMUNITY)
Admission: EM | Disposition: A | Discharge status: Home / Self Care | Payer: Self-pay | Source: Home / Self Care | Attending: Internal Medicine

## 2018-12-27 DIAGNOSIS — I426 Alcoholic cardiomyopathy: Principal | ICD-10-CM

## 2018-12-27 DIAGNOSIS — I5022 Chronic systolic (congestive) heart failure: Secondary | ICD-10-CM

## 2018-12-27 HISTORY — PX: RIGHT/LEFT HEART CATH AND CORONARY ANGIOGRAPHY: CATH118266

## 2018-12-27 LAB — POCT I-STAT EG7
Acid-Base Excess: 2 mmol/L (ref 0.0–2.0)
Bicarbonate: 28.8 mmol/L — ABNORMAL HIGH (ref 20.0–28.0)
Calcium, Ion: 1.19 mmol/L (ref 1.15–1.40)
HCT: 39 % (ref 39.0–52.0)
Hemoglobin: 13.3 g/dL (ref 13.0–17.0)
O2 Saturation: 66 %
Potassium: 3.8 mmol/L (ref 3.5–5.1)
SODIUM: 136 mmol/L (ref 135–145)
TCO2: 30 mmol/L (ref 22–32)
pCO2, Ven: 52 mmHg (ref 44.0–60.0)
pH, Ven: 7.352 (ref 7.250–7.430)
pO2, Ven: 37 mmHg (ref 32.0–45.0)

## 2018-12-27 LAB — CBC
HCT: 40.7 % (ref 39.0–52.0)
HCT: 40.7 % (ref 39.0–52.0)
Hemoglobin: 13.5 g/dL (ref 13.0–17.0)
Hemoglobin: 13.3 g/dL (ref 13.0–17.0)
MCH: 29 pg (ref 26.0–34.0)
MCH: 28.9 pg (ref 26.0–34.0)
MCHC: 33.2 g/dL (ref 30.0–36.0)
MCHC: 32.7 g/dL (ref 30.0–36.0)
MCV: 87.3 fL (ref 80.0–100.0)
MCV: 88.3 fL (ref 80.0–100.0)
Platelets: 133 10*3/uL — ABNORMAL LOW (ref 150–400)
Platelets: 133 10*3/uL — ABNORMAL LOW (ref 150–400)
RBC: 4.66 MIL/uL (ref 4.22–5.81)
RBC: 4.61 MIL/uL (ref 4.22–5.81)
RDW: 14.5 % (ref 11.5–15.5)
RDW: 14.5 % (ref 11.5–15.5)
WBC: 5.6 10*3/uL (ref 4.0–10.5)
WBC: 4.4 10*3/uL (ref 4.0–10.5)
nRBC: 0 % (ref 0.0–0.2)
nRBC: 0 % (ref 0.0–0.2)

## 2018-12-27 LAB — POCT I-STAT 7, (LYTES, BLD GAS, ICA,H+H)
Acid-Base Excess: 2 mmol/L (ref 0.0–2.0)
Bicarbonate: 27.9 mmol/L (ref 20.0–28.0)
Calcium, Ion: 1.2 mmol/L (ref 1.15–1.40)
HCT: 39 % (ref 39.0–52.0)
Hemoglobin: 13.3 g/dL (ref 13.0–17.0)
O2 Saturation: 98 %
PO2 ART: 104 mmHg (ref 83.0–108.0)
Potassium: 3.8 mmol/L (ref 3.5–5.1)
Sodium: 135 mmol/L (ref 135–145)
TCO2: 29 mmol/L (ref 22–32)
pCO2 arterial: 47.2 mmHg (ref 32.0–48.0)
pH, Arterial: 7.38 (ref 7.350–7.450)

## 2018-12-27 LAB — CREATININE, SERUM
Creatinine, Ser: 1.1 mg/dL (ref 0.61–1.24)
GFR calc Af Amer: >60 mL/min (ref >60)
GFR calc non Af Amer: >60 mL/min (ref >60)

## 2018-12-27 LAB — HEPARIN LEVEL (UNFRACTIONATED): Heparin Unfractionated: 0.35 IU/mL (ref 0.30–0.70)

## 2018-12-27 SURGERY — RIGHT/LEFT HEART CATH AND CORONARY ANGIOGRAPHY
Anesthesia: LOCAL

## 2018-12-27 MED ORDER — LOSARTAN POTASSIUM 50 MG PO TABS
50.0000 mg | ORAL_TABLET | Freq: Every day | ORAL | Status: DC
  Administered 2018-12-28: 50 mg via ORAL
  Filled 2018-12-27: qty 1

## 2018-12-27 MED ORDER — IOHEXOL 350 MG/ML SOLN
INTRAVENOUS | Status: DC | PRN
  Administered 2018-12-27: 45 mL

## 2018-12-27 MED ORDER — FENTANYL CITRATE (PF) 100 MCG/2ML IJ SOLN
INTRAMUSCULAR | Status: AC
  Filled 2018-12-27: qty 2

## 2018-12-27 MED ORDER — SODIUM CHLORIDE 0.9 % IV SOLN: 250.0000 mL | INTRAVENOUS | Status: DC | PRN

## 2018-12-27 MED ORDER — ACETAMINOPHEN 325 MG PO TABS: 650.0000 mg | ORAL_TABLET | ORAL | Status: DC | PRN

## 2018-12-27 MED ORDER — SODIUM CHLORIDE 0.9 % IV SOLN: INTRAVENOUS | Status: AC

## 2018-12-27 MED ORDER — MIDAZOLAM HCL 2 MG/2ML IJ SOLN
INTRAMUSCULAR | Status: AC
  Filled 2018-12-27: qty 2

## 2018-12-27 MED ORDER — HEPARIN SODIUM (PORCINE) 5000 UNIT/ML IJ SOLN
5000.0000 [IU] | Freq: Three times a day (TID) | INTRAMUSCULAR | Status: DC
  Administered 2018-12-28: 5000 [IU] via SUBCUTANEOUS
  Filled 2018-12-27: qty 1

## 2018-12-27 MED ORDER — LIDOCAINE HCL (PF) 1 % IJ SOLN
INTRAMUSCULAR | Status: DC | PRN
  Administered 2018-12-27 (×2): 2 mL

## 2018-12-27 MED ORDER — HEPARIN (PORCINE) IN NACL 1000-0.9 UT/500ML-% IV SOLN
INTRAVENOUS | Status: DC | PRN
  Administered 2018-12-27 (×2): 500 mL

## 2018-12-27 MED ORDER — LIDOCAINE HCL (PF) 1 % IJ SOLN
INTRAMUSCULAR | Status: AC
  Filled 2018-12-27: qty 30

## 2018-12-27 MED ORDER — HEPARIN SODIUM (PORCINE) 1000 UNIT/ML IJ SOLN
INTRAMUSCULAR | Status: DC | PRN
  Administered 2018-12-27: 5000 [IU] via INTRAVENOUS

## 2018-12-27 MED ORDER — SODIUM CHLORIDE 0.9% FLUSH
3.0000 mL | Freq: Two times a day (BID) | INTRAVENOUS | Status: DC
  Administered 2018-12-28: 3 mL via INTRAVENOUS

## 2018-12-27 MED ORDER — HEPARIN (PORCINE) IN NACL 1000-0.9 UT/500ML-% IV SOLN
INTRAVENOUS | Status: AC
  Filled 2018-12-27: qty 1000

## 2018-12-27 MED ORDER — SODIUM CHLORIDE 0.9% FLUSH: 3.0000 mL | INTRAVENOUS | Status: DC | PRN

## 2018-12-27 MED ORDER — VERAPAMIL HCL 2.5 MG/ML IV SOLN
INTRAVENOUS | Status: DC | PRN
  Administered 2018-12-27: 10 mL via INTRA_ARTERIAL

## 2018-12-27 MED ORDER — VERAPAMIL HCL 2.5 MG/ML IV SOLN
INTRAVENOUS | Status: AC
  Filled 2018-12-27: qty 2

## 2018-12-27 MED ORDER — NITROGLYCERIN 0.4 MG SL SUBL
0.4000 mg | SUBLINGUAL_TABLET | SUBLINGUAL | Status: DC | PRN
  Administered 2018-12-27: 0.4 mg via SUBLINGUAL
  Filled 2018-12-27: qty 1

## 2018-12-27 MED ORDER — HEPARIN SODIUM (PORCINE) 1000 UNIT/ML IJ SOLN
INTRAMUSCULAR | Status: AC
  Filled 2018-12-27: qty 1

## 2018-12-27 MED ORDER — ONDANSETRON HCL 4 MG/2ML IJ SOLN: 4.0000 mg | Freq: Four times a day (QID) | INTRAMUSCULAR | Status: DC | PRN

## 2018-12-27 SURGICAL SUPPLY — 13 items
CATH 5FR JL3.5 JR4 ANG PIG MP (CATHETERS) ×1 IMPLANT
CATH BALLN WEDGE 5F 110CM (CATHETERS) ×1 IMPLANT
DEVICE RAD COMP TR BAND LRG (VASCULAR PRODUCTS) ×1 IMPLANT
GLIDESHEATH SLEND A-KIT 6F 22G (SHEATH) ×1 IMPLANT
GUIDEWIRE INQWIRE 1.5J.035X260 (WIRE) IMPLANT
INQWIRE 1.5J .035X260CM (WIRE) ×2
KIT HEART LEFT (KITS) ×2 IMPLANT
PACK CARDIAC CATHETERIZATION (CUSTOM PROCEDURE TRAY) ×2 IMPLANT
SHEATH GLIDE SLENDER 4/5FR (SHEATH) ×1 IMPLANT
SHEATH PROBE COVER 6X72 (BAG) ×1 IMPLANT
TRANSDUCER W/STOPCOCK (MISCELLANEOUS) ×2 IMPLANT
TUBING CIL FLEX 10 FLL-RA (TUBING) ×2 IMPLANT
WIRE EMERALD 3MM-J .025X260CM (WIRE) ×1 IMPLANT

## 2018-12-27 NOTE — Progress Notes (Signed)
Patient complained of 5 out of 10 CP.  BP= 129/84 HR= [place] PA notified 1 nitro given at [phone]  ekg obtained   BP=  116/79    HR=  90 Patient states pain in gone at 0857

## 2018-12-27 NOTE — Progress Notes (Signed)
[facility] TEAM 1 - Stepdown/ICU TEAM  Jacob Adams  ZOX:[id number] DOB: June 12, 1966 DOA: 12/24/2018 PCP: Patient, No Pcp Per    Brief Narrative:  52yo w/ a hx ofEtOHabuse and hypertension who presented with 4-5 days of chest pain with radiation to the jaw.  Subjective: Reports his cp has resolved. Denies sob, n/v, or abdom pain . Reports severe ongoing pain in his R mandible, focused about an "infected tooth" which has not been seen by a Dentist.   Assessment & Plan:  Chest pain D-dimer is negative - cardiac cath w/o evidence of CAD - pain resolved   Newly diagnosed systolic CHF  EF 81-19% w/ diffuse hypokinesis on TTE - not volume overloaded on exam - care per Cardiolgy  Hypertension BP well controlled at this time   Alcohol abuse Continue CIWA - mildly tremulous on exam today, but no florid withdrawal thus far  Possible dental abscess Check dental Xray - if abscess present will begin empiric abx tx   Tobacco abuse  Counseled on need to abstain   DVT prophylaxis: lovenox   Code Status: FULL CODE Family Communication: no family present at time of exam  Disposition Plan: probable d/c home 3/10  Consultants:  Cardiology   Antimicrobials:  none  Objective: Blood pressure 113/78, pulse 78, temperature 99.3 F (37.4 C), temperature source Oral, resp. rate 15, height 5\' 10"  (1.778 m), weight 106.4 kg, SpO2 98 %.  Intake/Output Summary (Last 24 hours) at 12/27/2018 1507 Last data filed at 12/27/2018 0800 Gross per 24 hour  Intake 512.02 ml  Output -  Net 512.02 ml   Filed Weights   12/25/18 0028 12/26/18 0541 12/27/18 0401  Weight: 106.1 kg 105.1 kg 106.4 kg    Examination: General: No acute respiratory distress - moderate swelling of R mandible w/o erythema or calor  Lungs: Clear to auscultation bilaterally without wheezes or crackles Cardiovascular: Regular rate and rhythm without murmur gallop or rub normal S1 and S2 Abdomen: Nontender, nondistended, soft, bowel  sounds positive, no rebound, no ascites, no appreciable mass Extremities: No significant cyanosis, clubbing, or edema bilateral lower extremities  CBC: Recent Labs  Lab 12/25/18 0832 12/26/18 0510 12/27/18 0352  WBC 6.4 6.1 5.6  HGB 13.2 14.4 13.5  HCT 40.4 42.9 40.7  MCV 86.7 86.8 87.3  PLT 125* 144* 133*   Basic Metabolic Panel: Recent Labs  Lab 12/24/18 1917  NA 139  K 4.5  CL 106  CO2 18*  GLUCOSE 93  BUN 7  CREATININE 0.91  CALCIUM 9.0   GFR: Estimated Creatinine Clearance: 114.7 mL/min (by C-G formula based on SCr of 0.91 mg/dL).   Cardiac Enzymes: Recent Labs  Lab 12/24/18 2206 12/25/18 0309 12/25/18 0832 12/25/18 1352  TROPONINI <0.03 <0.03 0.03* 0.03*    HbA1C: Hgb A1c MFr Bld  Date/Time Value Ref Range Status  11/05/2015 06:05 AM 5.6 4.8 - 5.6 % Final    Comment:    (NOTE)         Pre-diabetes: 5.7 - 6.4         Diabetes: >6.4         Glycemic control for adults with diabetes: <7.0   07/27/2014 05:20 AM 5.7 (H) <5.7 % Final    Comment:    (NOTE)  According to the ADA Clinical Practice Recommendations for 2011, when HbA1c is used as a screening test:  >=6.5%   Diagnostic of Diabetes Mellitus           (if abnormal result is confirmed) 5.7-6.4%   Increased risk of developing Diabetes Mellitus References:Diagnosis and Classification of Diabetes Mellitus,Diabetes Care,2011,34(Suppl 1):S62-S69 and Standards of Medical Care in         Diabetes - 2011,Diabetes Care,2011,34 (Suppl 1):S11-S61.    Scheduled Meds: . aspirin EC  81 mg Oral Daily  . atorvastatin  40 mg Oral QHS  . carvedilol  12.5 mg Oral BID WC  . DULoxetine  30 mg Oral Daily  . folic acid  1 mg Oral Daily  . heparin  5,000 Units Subcutaneous Q8H  . [START ON 12/28/2018] losartan  50 mg Oral Daily  . multivitamin with minerals  1 tablet Oral Daily  . pantoprazole  40 mg Oral BID  . sodium chloride flush  3 mL  Intravenous Once  . sodium chloride flush  3 mL Intravenous Q12H  . spironolactone  25 mg Oral Daily  . thiamine  100 mg Oral Daily   Or  . thiamine  100 mg Intravenous Daily     LOS: 3 days   Lonia Blood, MD [facility] Office  [phone] Pager - Text Page per Amion  If 7PM-7AM, please contact night-coverage per Amion 12/27/2018, 3:07 PM

## 2018-12-27 NOTE — Interval H&P Note (Signed)
Cath Lab Visit (complete for each Cath Lab visit)  Clinical Evaluation Leading to the Procedure:   ACS: Yes.    Non-ACS:    Anginal Classification: CCS IV  Anti-ischemic medical therapy: Minimal Therapy (1 class of medications)  Non-Invasive Test Results: No non-invasive testing performed  Prior CABG: No previous CABG      History and Physical Interval Note:  12/27/2018 1:45 PM  Jacob Adams  has presented today for surgery, with the diagnosis of hf.  The various methods of treatment have been discussed with the patient and family. After consideration of risks, benefits and other options for treatment, the patient has consented to  Procedure(s): RIGHT/LEFT HEART CATH AND CORONARY ANGIOGRAPHY (N/A) as a surgical intervention.  The patient's history has been reviewed, patient examined, no change in status, stable for surgery.  I have reviewed the patient's chart and labs.  Questions were answered to the patient's satisfaction.     Belva Crome III

## 2018-12-27 NOTE — Progress Notes (Signed)
Per Radiology Orthopantogram will be done in am. Jessie Foot, RN

## 2018-12-27 NOTE — Progress Notes (Signed)
Progress Note  Patient Name: Jacob Adams Date of Encounter: 12/27/2018  Primary Cardiologist: Ena Dawley, Jacob   Subjective   Pt had an episode of CP this morning that resolved after 1 SL NTG. Currently CP free. No resting dyspnea. Awaiting cath today. He has no questions regarding cath.   Inpatient Medications    Scheduled Meds: . aspirin EC  81 mg Oral Daily  . atorvastatin  40 mg Oral QHS  . carvedilol  12.5 mg Oral BID WC  . DULoxetine  30 mg Oral Daily  . folic acid  1 mg Oral Daily  . lisinopril  20 mg Oral Daily  . multivitamin with minerals  1 tablet Oral Daily  . pantoprazole  40 mg Oral BID  . sodium chloride flush  3 mL Intravenous Once  . sodium chloride flush  3 mL Intravenous Q12H  . spironolactone  25 mg Oral Daily  . thiamine  100 mg Oral Daily   Or  . thiamine  100 mg Intravenous Daily   Continuous Infusions: . sodium chloride    . sodium chloride 50 mL/hr at 12/27/18 0506  . heparin 1,300 Units/hr (12/27/18 0715)   PRN Meds: sodium chloride, acetaminophen, alum & mag hydroxide-simeth, benzocaine, LORazepam **OR** LORazepam, nitroGLYCERIN, ondansetron (ZOFRAN) IV, sodium chloride flush, traMADol   Vital Signs    Vitals:   12/26/18 2330 12/27/18 0401 12/27/18 0840 12/27/18 0854  BP: 128/80 131/88 118/81 129/84  Pulse: 79 87 86   Resp:  16  15  Temp:  99.3 F (37.4 C)    TempSrc:  Oral    SpO2:  96%    Weight:  106.4 kg    Height:        Intake/Output Summary (Last 24 hours) at 12/27/2018 0948 Last data filed at 12/27/2018 0530 Gross per 24 hour  Intake 332.02 ml  Output -  Net 332.02 ml   Last 3 Weights 12/27/2018 12/26/2018 12/25/2018  Weight (lbs) 234 lb 9.6 oz 231 lb 12.8 oz 233 lb 12.8 oz  Weight (kg) 106.414 kg 105.144 kg 106.051 kg  Some encounter information is confidential and restricted. Go to Review Flowsheets activity to see all data.      Telemetry    NSR 90s. No ventricular arrhthymias  - Personally Reviewed  ECG    NSR,  ST depressions ? repol abnormalities  - Personally Reviewed  Physical Exam   GEN:  moderately obese middle aged AAM in No acute distress.   Neck: No JVD Cardiac: RRR, no murmurs, rubs, or gallops.  Respiratory: Clear to auscultation bilaterally. GI: Soft, nontender, non-distended  MS: No edema; No deformity. Neuro:  Nonfocal  Psych: Normal affect   Labs    Chemistry Recent Labs  Lab 12/24/18 1917  NA 139  K 4.5  CL 106  CO2 18*  GLUCOSE 93  BUN 7  CREATININE 0.91  CALCIUM 9.0  GFRNONAA >60  GFRAA >60  ANIONGAP 15     Hematology Recent Labs  Lab 12/25/18 0832 12/26/18 0510 12/27/18 0352  WBC 6.4 6.1 5.6  RBC 4.66 4.94 4.66  HGB 13.2 14.4 13.5  HCT 40.4 42.9 40.7  MCV 86.7 86.8 87.3  MCH 28.3 29.1 29.0  MCHC 32.7 33.6 33.2  RDW 14.7 14.5 14.5  PLT 125* 144* 133*    Cardiac Enzymes Recent Labs  Lab 12/24/18 2206 12/25/18 0309 12/25/18 0832 12/25/18 1352  TROPONINI <0.03 <0.03 0.03* 0.03*    Recent Labs  Lab 12/24/18 1926  TROPIPOC 0.14*     BNPNo results for input(s): BNP, PROBNP in the last 168 hours.   DDimer  Recent Labs  Lab 12/24/18 1920  DDIMER 0.32     Radiology    No results found.  Cardiac Studies   2D Echo 12/26/18 1. The left ventricle has a visually estimated ejection fraction of approximately 25%. There is overall diffuse hypokinesis with some regional variation and inferoseptal dyssynergy. The cavity size was normal. There is moderate asymmetric left  ventricular hypertrophy. Left ventricular diastolic Doppler parameters are indeterminate.  2. The right ventricle has mildly reduced systolic function. Right ventricular systolic pressure could not be assessed.  3. The mitral valve is normal in structure. There is mild mitral annular calcification present.  4. The tricuspid valve is normal in structure.  5. The aortic valve is tricuspid.  6. The aortic root is normal in size and structure.  R/LHC - pending   Patient  Profile     Jacob Adams is a 53 y.o. male with a hx of alcohol abuse, HTN, admitted with lightheadedness and dull chest pain who is being seen today for the evaluation of new diagnosis of cardiomyopathy at the request of Bonnell Public, Jacob.  Assessment & Plan    1. New diagnosis of combined systolic and diastolic CHF: - LVEF 16-10% with diffuse hypokinesis - possible etiology includes etoh abuse, CAD - we will plan for [facility] today.  - Lisinopril was initiated this admission, but consider transition to [facility] (will require 36 hr ACEi washout) -Carvedilol and spironolactone also added. BP improved.  - Will check lipid panel   2. Chest Pain: Pt had CP this morning that resolved after 1 dose of SL NTG. EKG w/ ST depressions. Currently CP free. On for cath today.   3. Hypertension: controlled this morning at 118/81. Continue Coreg, spironolactone + lisinopril but consider changing from ACEi to [facility] as outlined above.   3. Dilated pulmonary artery on chest CTA: - no pulmonary hypertension of echocardiogram - possible sleep apnea, consider outpatient evaluation   For questions or updates, please contact [place] Please consult [email or web address] for contact info under        Signed, Lyda Jester, PA-C  12/27/2018, 9:48 AM

## 2018-12-27 NOTE — H&P (View-Only) (Signed)
Progress Note  Patient Name: Jacob Adams Date of Encounter: 12/27/2018  Primary Cardiologist: Ena Dawley, MD   Subjective   Pt had an episode of CP this morning that resolved after 1 SL NTG. Currently CP free. No resting dyspnea. Awaiting cath today. He has no questions regarding cath.   Inpatient Medications    Scheduled Meds: . aspirin EC  81 mg Oral Daily  . atorvastatin  40 mg Oral QHS  . carvedilol  12.5 mg Oral BID WC  . DULoxetine  30 mg Oral Daily  . folic acid  1 mg Oral Daily  . lisinopril  20 mg Oral Daily  . multivitamin with minerals  1 tablet Oral Daily  . pantoprazole  40 mg Oral BID  . sodium chloride flush  3 mL Intravenous Once  . sodium chloride flush  3 mL Intravenous Q12H  . spironolactone  25 mg Oral Daily  . thiamine  100 mg Oral Daily   Or  . thiamine  100 mg Intravenous Daily   Continuous Infusions: . sodium chloride    . sodium chloride 50 mL/hr at 12/27/18 0506  . heparin 1,300 Units/hr (12/27/18 0715)   PRN Meds: sodium chloride, acetaminophen, alum & mag hydroxide-simeth, benzocaine, LORazepam **OR** LORazepam, nitroGLYCERIN, ondansetron (ZOFRAN) IV, sodium chloride flush, traMADol   Vital Signs    Vitals:   12/26/18 2330 12/27/18 0401 12/27/18 0840 12/27/18 0854  BP: 128/80 131/88 118/81 129/84  Pulse: 79 87 86   Resp:  16  15  Temp:  99.3 F (37.4 C)    TempSrc:  Oral    SpO2:  96%    Weight:  106.4 kg    Height:        Intake/Output Summary (Last 24 hours) at 12/27/2018 0948 Last data filed at 12/27/2018 0530 Gross per 24 hour  Intake 332.02 ml  Output -  Net 332.02 ml   Last 3 Weights 12/27/2018 12/26/2018 12/25/2018  Weight (lbs) 234 lb 9.6 oz 231 lb 12.8 oz 233 lb 12.8 oz  Weight (kg) 106.414 kg 105.144 kg 106.051 kg  Some encounter information is confidential and restricted. Go to Review Flowsheets activity to see all data.      Telemetry    NSR 90s. No ventricular arrhthymias  - Personally Reviewed  ECG    NSR,  ST depressions ? repol abnormalities  - Personally Reviewed  Physical Exam   GEN:  moderately obese middle aged AAM in No acute distress.   Neck: No JVD Cardiac: RRR, no murmurs, rubs, or gallops.  Respiratory: Clear to auscultation bilaterally. GI: Soft, nontender, non-distended  MS: No edema; No deformity. Neuro:  Nonfocal  Psych: Normal affect   Labs    Chemistry Recent Labs  Lab 12/24/18 1917  NA 139  K 4.5  CL 106  CO2 18*  GLUCOSE 93  BUN 7  CREATININE 0.91  CALCIUM 9.0  GFRNONAA >60  GFRAA >60  ANIONGAP 15     Hematology Recent Labs  Lab 12/25/18 0832 12/26/18 0510 12/27/18 0352  WBC 6.4 6.1 5.6  RBC 4.66 4.94 4.66  HGB 13.2 14.4 13.5  HCT 40.4 42.9 40.7  MCV 86.7 86.8 87.3  MCH 28.3 29.1 29.0  MCHC 32.7 33.6 33.2  RDW 14.7 14.5 14.5  PLT 125* 144* 133*    Cardiac Enzymes Recent Labs  Lab 12/24/18 2206 12/25/18 0309 12/25/18 0832 12/25/18 1352  TROPONINI <0.03 <0.03 0.03* 0.03*    Recent Labs  Lab 12/24/18 1926  TROPIPOC 0.14*     BNPNo results for input(s): BNP, PROBNP in the last 168 hours.   DDimer  Recent Labs  Lab 12/24/18 1920  DDIMER 0.32     Radiology    No results found.  Cardiac Studies   2D Echo 12/26/18 1. The left ventricle has a visually estimated ejection fraction of approximately 25%. There is overall diffuse hypokinesis with some regional variation and inferoseptal dyssynergy. The cavity size was normal. There is moderate asymmetric left  ventricular hypertrophy. Left ventricular diastolic Doppler parameters are indeterminate.  2. The right ventricle has mildly reduced systolic function. Right ventricular systolic pressure could not be assessed.  3. The mitral valve is normal in structure. There is mild mitral annular calcification present.  4. The tricuspid valve is normal in structure.  5. The aortic valve is tricuspid.  6. The aortic root is normal in size and structure.  R/LHC - pending   Patient  Profile     Jacob Adams is a 53 y.o. male with a hx of alcohol abuse, HTN, admitted with lightheadedness and dull chest pain who is being seen today for the evaluation of new diagnosis of cardiomyopathy at the request of Bonnell Public, MD.  Assessment & Plan    1. New diagnosis of combined systolic and diastolic CHF: - LVEF 03-00% with diffuse hypokinesis - possible etiology includes etoh abuse, CAD - we will plan for [facility] today.  - Lisinopril was initiated this admission, but consider transition to [facility] (will require 36 hr ACEi washout) -Carvedilol and spironolactone also added. BP improved.  - Will check lipid panel   2. Chest Pain: Pt had CP this morning that resolved after 1 dose of SL NTG. EKG w/ ST depressions. Currently CP free. On for cath today.   3. Hypertension: controlled this morning at 118/81. Continue Coreg, spironolactone + lisinopril but consider changing from ACEi to [facility] as outlined above.   3. Dilated pulmonary artery on chest CTA: - no pulmonary hypertension of echocardiogram - possible sleep apnea, consider outpatient evaluation   For questions or updates, please contact [employer] Please consult [email or web address] for contact info under        Signed, Lyda Jester, PA-C  12/27/2018, 9:48 AM

## 2018-12-27 NOTE — Progress Notes (Signed)
[place] for heparin Indication: chest pain/ACS  Allergies  Allergen Reactions  . Uncoded Nonscreenable Allergen Other (See Comments)    Sun tan lotion: swelling and peeling  . Acetaminophen Other (See Comments)    Make he side hurt; he stated he has liver damage.   . Ibuprofen Swelling    Swelling and discomfort of stomach  . Nsaids Other (See Comments)    Swelling and discomfort of stomach  . Penicillins Itching, Swelling and Rash    Has patient had a PCN reaction causing immediate rash, facial/tongue/throat swelling, SOB or lightheadedness with hypotension: yes Has patient had a PCN reaction causing severe rash involving mucus membranes or skin necrosis: no Has patient had a PCN reaction that required hospitalization yes, happened while hospitalized Has patient had a PCN reaction occurring within the last 10 years: yes If all of the above answers are "NO", then may proceed with Cephalosporin use.   . [name] Other (See Comments)    DOESN'T EAT PORK-patient preference    Patient Measurements: Height: 5' 10"  (177.8 cm) Weight: 234 lb 9.6 oz (106.4 kg) IBW/kg (Calculated) : 73 Heparin Dosing Weight: 97.9 kg  Vital Signs: Temp: 99.3 F (37.4 C) (03/09 0401) Temp Source: Oral (03/09 0401) BP: 129/84 (03/09 0854) Pulse Rate: 86 (03/09 0840)  Labs: Recent Labs    12/24/18 1917  12/25/18 0309 12/25/18 0832 12/25/18 1352 12/26/18 0510 12/27/18 0352  HGB 16.0  --   --  13.2  --  14.4 13.5  HCT 49.3  --   --  40.4  --  42.9 40.7  PLT 158  --   --  125*  --  144* 133*  HEPARINUNFRC  --    < > 0.46 0.51  --  0.36 0.35  CREATININE 0.91  --   --   --   --   --   --   TROPONINI  --    < > <0.03 0.03* 0.03*  --   --    < > = values in this interval not displayed.    Estimated Creatinine Clearance: 114.7 mL/min (by C-G formula based on SCr of 0.91 mg/dL).   Medical History: Past Medical History:  Diagnosis Date  .  Acid reflux   . Anxiety   . Arthritis    "toes" (07/26/2014)  . Bipolar disorder ([place])   . Depression   . ZTIWPYKD(983.3)    "weekly" (07/26/2014)  . History of blood transfusion ~ 2000   "related to nose bleeding"  . History of stomach ulcers   . Hypertension   . Lower GI bleeding admitted 07/26/2014  . Mental disorder   . Migraine    "@ least monthly" (07/26/2014)  . Pancreatitis   . Rectal bleeding 07/26/2014  . Sleep apnea    "haven't been RX'd mask yet" (07/26/2014)    Medications:  Scheduled:  . aspirin EC  81 mg Oral Daily  . atorvastatin  40 mg Oral QHS  . carvedilol  12.5 mg Oral BID WC  . DULoxetine  30 mg Oral Daily  . folic acid  1 mg Oral Daily  . lisinopril  20 mg Oral Daily  . multivitamin with minerals  1 tablet Oral Daily  . pantoprazole  40 mg Oral BID  . sodium chloride flush  3 mL Intravenous Once  . sodium chloride flush  3 mL Intravenous Q12H  . spironolactone  25 mg Oral Daily  . thiamine  100 mg Oral  Daily   Or  . thiamine  100 mg Intravenous Daily   Infusions:  . sodium chloride    . sodium chloride 50 mL/hr at 12/27/18 0506  . heparin 1,300 Units/hr (12/27/18 0715)    Assessment: 89 yoM admitted 3/6 with 4 day history of mild central chest pain. Troponin max=  0.14 and EF= 25%. Pharmacy consulted to dose heparin infusion. Patient not on anticoagulation PTA. Plans noted for cath today -heparin level at goal -hg stable, plt= 133 (158 on 3/6)   Goal of Therapy:  Heparin level 0.3-0.7 units/ml Monitor platelets by anticoagulation protocol: Yes   Plan:  No heparin changes needed Daily heparin level and CBC Will follow plans post cath  Hildred Laser, PharmD Clinical Pharmacist **Pharmacist phone directory can now be found on [email or web address] ([email or web address]).  Listed under [place].

## 2018-12-28 ENCOUNTER — Encounter (HOSPITAL_COMMUNITY): Payer: Self-pay | Admitting: Interventional Cardiology

## 2018-12-28 ENCOUNTER — Inpatient Hospital Stay (HOSPITAL_COMMUNITY): Payer: Medicaid Other

## 2018-12-28 DIAGNOSIS — I5041 Acute combined systolic (congestive) and diastolic (congestive) heart failure: Secondary | ICD-10-CM

## 2018-12-28 DIAGNOSIS — R7989 Other specified abnormal findings of blood chemistry: Secondary | ICD-10-CM

## 2018-12-28 DIAGNOSIS — F1020 Alcohol dependence, uncomplicated: Secondary | ICD-10-CM

## 2018-12-28 LAB — COMPREHENSIVE METABOLIC PANEL
ALT: 34 U/L (ref 0–44)
AST: 45 U/L — ABNORMAL HIGH (ref 15–41)
Albumin: 3.2 g/dL — ABNORMAL LOW (ref 3.5–5.0)
Alkaline Phosphatase: 74 U/L (ref 38–126)
Anion gap: 8 (ref 5–15)
BILIRUBIN TOTAL: 0.7 mg/dL (ref 0.3–1.2)
BUN: 9 mg/dL (ref 6–20)
CO2: 26 mmol/L (ref 22–32)
Calcium: 8.8 mg/dL — ABNORMAL LOW (ref 8.9–10.3)
Chloride: 101 mmol/L (ref 98–111)
Creatinine, Ser: 1.05 mg/dL (ref 0.61–1.24)
GFR calc Af Amer: >60 mL/min (ref >60)
GFR calc non Af Amer: >60 mL/min (ref >60)
Glucose, Bld: 96 mg/dL (ref 70–99)
Potassium: 4.3 mmol/L (ref 3.5–5.1)
Sodium: 135 mmol/L (ref 135–145)
Total Protein: 6.5 g/dL (ref 6.5–8.1)

## 2018-12-28 LAB — CBC
HCT: 38.6 % — ABNORMAL LOW (ref 39.0–52.0)
Hemoglobin: 12.7 g/dL — ABNORMAL LOW (ref 13.0–17.0)
MCH: 29.3 pg (ref 26.0–34.0)
MCHC: 32.9 g/dL (ref 30.0–36.0)
MCV: 89.1 fL (ref 80.0–100.0)
PLATELETS: 147 10*3/uL — AB (ref 150–400)
RBC: 4.33 MIL/uL (ref 4.22–5.81)
RDW: 14.6 % (ref 11.5–15.5)
WBC: 3.7 10*3/uL — ABNORMAL LOW (ref 4.0–10.5)
nRBC: 0 % (ref 0.0–0.2)

## 2018-12-28 LAB — LIPID PANEL
Cholesterol: 134 mg/dL (ref 0–200)
HDL: 67 mg/dL (ref >40)
LDL Cholesterol: 54 mg/dL (ref 0–99)
Total CHOL/HDL Ratio: 2 RATIO
Triglycerides: 64 mg/dL (ref <150)
VLDL: 13 mg/dL (ref 0–40)

## 2018-12-28 LAB — PHOSPHORUS: Phosphorus: 4.1 mg/dL (ref 2.5–4.6)

## 2018-12-28 LAB — MAGNESIUM: Magnesium: 1.9 mg/dL (ref 1.7–2.4)

## 2018-12-28 MED ORDER — CARVEDILOL 12.5 MG PO TABS: 12.5000 mg | ORAL_TABLET | Freq: Two times a day (BID) | ORAL | 0 refills | Status: DC

## 2018-12-28 MED ORDER — CLINDAMYCIN HCL 150 MG PO CAPS: 450.0000 mg | ORAL_CAPSULE | Freq: Three times a day (TID) | ORAL | 0 refills | Status: DC

## 2018-12-28 MED ORDER — CLINDAMYCIN HCL 300 MG PO CAPS
450.0000 mg | ORAL_CAPSULE | Freq: Three times a day (TID) | ORAL | Status: DC
  Administered 2018-12-28: 450 mg via ORAL
  Filled 2018-12-28: qty 1

## 2018-12-28 MED ORDER — LOSARTAN POTASSIUM 50 MG PO TABS: 50.0000 mg | ORAL_TABLET | Freq: Every day | ORAL | 0 refills | Status: DC

## 2018-12-28 MED ORDER — SPIRONOLACTONE 25 MG PO TABS: 25.0000 mg | ORAL_TABLET | Freq: Every day | ORAL | 0 refills | Status: DC

## 2018-12-28 NOTE — Discharge Summary (Signed)
DISCHARGE SUMMARY  Jacob Adams  MR#: [id number]  DOB:[birth year]  Date of Admission: 12/24/2018 Date of Discharge: 12/28/2018  Attending Physician:My Madariaga Silvestre Gunner, MD  Patient's WUJ:[id number], No Pcp Per  Consults:  Cardiology   Disposition: D/C home   Follow-up Appts: Follow-up Information    Lars Masson, MD Follow up in 2 week(s).   Specialty:  Cardiology Contact information: [address] [place] [address] [phone]           Tests Needing Follow-up: -assess EtOH abstinence -assess BP control  -f/u on newly diagnosed systolic CHF  -f/u renal fxn on new ARB and aldactone   Discharge Diagnoses: Chest pain - idiopathic  Newly diagnosed systolic CHF  Hypertension Alcohol abuse Dental carries w/ suspected gingivitis  Tobacco abuse    Initial presentation: 52yo w/ a hx ofEtOHabuse and hypertension who presented with 4-5 days of chest pain with radiation to the jaw.  Hospital Course:  Chest pain D-dimer negative - cardiac cath w/o evidence of CAD - pain resolved spontaneously   Newly diagnosed systolic CHF  EF 95-28% w/ diffuse hypokinesis on TTE - not volume overloaded on exam - care per Cardiolgy - suspect this is due to EtOH  Hypertension BP well controlled   Alcohol abuse CIWA administered th/o hospital stay - no florid withdrawal encountered during this admit   Dental carries w/ possible gingivitis  dental Xray w/o clear evidence of an abscess - give trial of empiric abx - counseled pt to seek an appointment w/ a Dentist post-d/c    Tobacco abuse  Counseled on need to abstain   Allergies as of 12/28/2018      Reactions   Uncoded Nonscreenable Allergen Other (See Comments)   Sun tan lotion: swelling and peeling   Acetaminophen Other (See Comments)   Make he side hurt; he stated he has liver damage.    Ibuprofen Swelling   Swelling and discomfort of stomach   Nsaids Other (See Comments)   Swelling and  discomfort of stomach   Penicillins Itching, Swelling, Rash   Has patient had a PCN reaction causing immediate rash, facial/tongue/throat swelling, SOB or lightheadedness with hypotension: yes Has patient had a PCN reaction causing severe rash involving mucus membranes or skin necrosis: no Has patient had a PCN reaction that required hospitalization yes, happened while hospitalized Has patient had a PCN reaction occurring within the last 10 years: yes If all of the above answers are "NO", then may proceed with Cephalosporin use.   [name] Other (See Comments)   DOESN'T EAT PORK-patient preference      Medication List    STOP taking these medications   amLODipine 10 MG tablet Commonly known as:  NORVASC     TAKE these medications   atorvastatin 40 MG tablet Commonly known as:  LIPITOR Take 1 tablet (40 mg total) by mouth at bedtime.   carvedilol 12.5 MG tablet Commonly known as:  COREG Take 1 tablet (12.5 mg total) by mouth 2 (two) times daily with a meal.   clindamycin 150 MG capsule Commonly known as:  CLEOCIN Take 3 capsules (450 mg total) by mouth every 8 (eight) hours.   DULoxetine 30 MG capsule Commonly known as:  CYMBALTA Take 1 capsule (30 mg total) by mouth daily.   losartan 50 MG tablet Commonly known as:  COZAAR Take 1 tablet (50 mg total) by mouth daily. Start taking on:  December 29, 2018   ondansetron 4 MG disintegrating tablet Commonly  known as:  Zofran ODT Take 1 tablet (4 mg total) by mouth every 8 (eight) hours as needed for nausea or vomiting.   pantoprazole 40 MG tablet Commonly known as:  PROTONIX Take 1 tablet (40 mg total) by mouth 2 (two) times daily.   spironolactone 25 MG tablet Commonly known as:  ALDACTONE Take 1 tablet (25 mg total) by mouth daily. Start taking on:  December 29, 2018       Day of Discharge BP 121/86   Pulse 75   Temp 99.1 F (37.3 C) (Oral)   Resp (!) 22   Ht 5\' 10"  (1.778 m)   Wt 108.2 kg   SpO2 98%    BMI 34.24 kg/m   Physical Exam: General: No acute respiratory distress Lungs: Clear to auscultation bilaterally without wheezes or crackles Cardiovascular: Regular rate and rhythm without murmur gallop or rub normal S1 and S2 Abdomen: Nontender, nondistended, soft, bowel sounds positive, no rebound, no ascites, no appreciable mass Extremities: No significant cyanosis, clubbing, or edema bilateral lower extremities  Basic Metabolic Panel: Recent Labs  Lab 12/24/18 1917 12/27/18 1312 12/27/18 1315 12/27/18 1455 12/28/18 0340  NA 139 136 135  --  135  K 4.5 3.8 3.8  --  4.3  CL 106  --   --   --  101  CO2 18*  --   --   --  26  GLUCOSE 93  --   --   --  96  BUN 7  --   --   --  9  CREATININE 0.91  --   --  1.10 1.05  CALCIUM 9.0  --   --   --  8.8*  MG  --   --   --   --  1.9  PHOS  --   --   --   --  4.1    Liver Function Tests: Recent Labs  Lab 12/28/18 0340  AST 45*  ALT 34  ALKPHOS 74  BILITOT 0.7  PROT 6.5  ALBUMIN 3.2*   CBC: Recent Labs  Lab 12/25/18 0832 12/26/18 0510 12/27/18 0352 12/27/18 1312 12/27/18 1315 12/27/18 1455 12/28/18 0340  WBC 6.4 6.1 5.6  --   --  4.4 3.7*  HGB 13.2 14.4 13.5 13.3 13.3 13.3 12.7*  HCT 40.4 42.9 40.7 39.0 39.0 40.7 38.6*  MCV 86.7 86.8 87.3  --   --  88.3 89.1  PLT 125* 144* 133*  --   --  133* 147*    Cardiac Enzymes: Recent Labs  Lab 12/24/18 2206 12/25/18 0309 12/25/18 0832 12/25/18 1352  TROPONINI <0.03 <0.03 0.03* 0.03*    Time spent in discharge (includes decision making & examination of pt): 30 minutes  12/28/2018, 2:46 PM   Lonia Blood, MD [facility] Office  [phone]

## 2018-12-28 NOTE — Progress Notes (Signed)
Progress Note  Patient Name: Jacob Adams Date of Encounter: 12/28/2018  Primary Cardiologist: Ena Dawley, MD   Subjective   Feels well. Complains of soreness in left face. No dyspnea or chest pain.  Inpatient Medications    Scheduled Meds: . aspirin EC  81 mg Oral Daily  . atorvastatin  40 mg Oral QHS  . carvedilol  12.5 mg Oral BID WC  . DULoxetine  30 mg Oral Daily  . folic acid  1 mg Oral Daily  . heparin  5,000 Units Subcutaneous Q8H  . losartan  50 mg Oral Daily  . multivitamin with minerals  1 tablet Oral Daily  . pantoprazole  40 mg Oral BID  . sodium chloride flush  3 mL Intravenous Once  . sodium chloride flush  3 mL Intravenous Q12H  . spironolactone  25 mg Oral Daily  . thiamine  100 mg Oral Daily   Continuous Infusions: . sodium chloride     PRN Meds: sodium chloride, acetaminophen, alum & mag hydroxide-simeth, benzocaine, nitroGLYCERIN, ondansetron (ZOFRAN) IV, sodium chloride flush, traMADol   Vital Signs    Vitals:   12/27/18 2139 12/28/18 0526 12/28/18 0804 12/28/18 0812  BP: 97/73 98/65 121/83   Pulse: 71 88 85 86  Resp: 16 15 (!) 22   Temp: 98.2 F (36.8 C) 99.1 F (37.3 C)    TempSrc: Oral Oral    SpO2: 98% 98%    Weight:  108.2 kg    Height:        Intake/Output Summary (Last 24 hours) at 12/28/2018 0905 Last data filed at 12/27/2018 2020 Gross per 24 hour  Intake 1297.92 ml  Output -  Net 1297.92 ml   Last 3 Weights 12/28/2018 12/27/2018 12/26/2018  Weight (lbs) 238 lb 9.6 oz 234 lb 9.6 oz 231 lb 12.8 oz  Weight (kg) 108.228 kg 106.414 kg 105.144 kg  Some encounter information is confidential and restricted. Go to Review Flowsheets activity to see all data.      Telemetry    NSR 90s. No ventricular arrhthymias  - Personally Reviewed  ECG    None today - Personally Reviewed  Physical Exam   GEN:  moderately obese middle aged AAM in No acute distress.   Neck: No JVD Cardiac: RRR, no murmurs, rubs, or gallops.  Respiratory:  Clear to auscultation bilaterally. GI: Soft, nontender, non-distended  MS: No edema; No deformity. Neuro:  Nonfocal  Psych: Normal affect   Labs    Chemistry Recent Labs  Lab 12/24/18 1917 12/27/18 1312 12/27/18 1315 12/27/18 1455 12/28/18 0340  NA 139 136 135  --  135  K 4.5 3.8 3.8  --  4.3  CL 106  --   --   --  101  CO2 18*  --   --   --  26  GLUCOSE 93  --   --   --  96  BUN 7  --   --   --  9  CREATININE 0.91  --   --  1.10 1.05  CALCIUM 9.0  --   --   --  8.8*  PROT  --   --   --   --  6.5  ALBUMIN  --   --   --   --  3.2*  AST  --   --   --   --  45*  ALT  --   --   --   --  34  ALKPHOS  --   --   --   --  74  BILITOT  --   --   --   --  0.7  GFRNONAA >60  --   --  >60 >60  GFRAA >60  --   --  >60 >60  ANIONGAP 15  --   --   --  8     Hematology Recent Labs  Lab 12/27/18 0352  12/27/18 1315 12/27/18 1455 12/28/18 0340  WBC 5.6  --   --  4.4 3.7*  RBC 4.66  --   --  4.61 4.33  HGB 13.5   < > 13.3 13.3 12.7*  HCT 40.7   < > 39.0 40.7 38.6*  MCV 87.3  --   --  88.3 89.1  MCH 29.0  --   --  28.9 29.3  MCHC 33.2  --   --  32.7 32.9  RDW 14.5  --   --  14.5 14.6  PLT 133*  --   --  133* 147*   < > = values in this interval not displayed.    Cardiac Enzymes Recent Labs  Lab 12/24/18 2206 12/25/18 0309 12/25/18 0832 12/25/18 1352  TROPONINI <0.03 <0.03 0.03* 0.03*    Recent Labs  Lab 12/24/18 1926  TROPIPOC 0.14*     BNPNo results for input(s): BNP, PROBNP in the last 168 hours.   DDimer  Recent Labs  Lab 12/24/18 1920  DDIMER 0.32     Radiology    No results found.  Cardiac Studies   2D Echo 12/26/18 1. The left ventricle has a visually estimated ejection fraction of approximately 25%. There is overall diffuse hypokinesis with some regional variation and inferoseptal dyssynergy. The cavity size was normal. There is moderate asymmetric left  ventricular hypertrophy. Left ventricular diastolic Doppler parameters are indeterminate.   2. The right ventricle has mildly reduced systolic function. Right ventricular systolic pressure could not be assessed.  3. The mitral valve is normal in structure. There is mild mitral annular calcification present.  4. The tricuspid valve is normal in structure.  5. The aortic valve is tricuspid.  6. The aortic root is normal in size and structure.  R/LHC - Conclusion    Right dominant coronary anatomy, with normal RCA demonstrating no atherosclerosis.  Normal left main  LAD wraps around the left ventricular apex, and is normal.  Normal circumflex  Normal left ventricular end-diastolic pressure, 10 mmHg.  Normal pulmonary pressures.  Normal capillary wedge.  RECOMMENDATIONS:   Systolic heart failure without evidence of coronary atherosclerosis.  Current hemodynamics suggest that the patient has had adequate diuresis and is borderline hypovolemic.     Patient Profile     Jacob Adams is a 53 y.o. male with a hx of alcohol abuse, HTN, admitted with lightheadedness and dull chest pain who is being seen today for the evaluation of new diagnosis of cardiomyopathy at the request of Bonnell Public, MD.  Assessment & Plan    1. Acute  combined systolic and diastolic CHF: - LVEF 88-89% with diffuse hypokinesis - most likely related to chronic Etoh abuse. - No CAD by cath. Right heart pressures look good.  - Lisinopril was initiated this admission, but switched to losartan. Would ideally transition to [facility] after 36 hour ACEi washout providing it is affordable. Not sure if this is covered with his [facility] benefits and he has not yet been approved for Medicaid. This could be addressed as outpatient -Carvedilol and spironolactone also added. BP improved.   2.  Hypertension: controlled this  morning. Continue Coreg, spironolactone + losartan  3. Dilated pulmonary artery on chest CTA: - no pulmonary hypertension of echocardiogram or cath - possible sleep apnea, consider outpatient  evaluation   [facility] will sign off.   Medication Recommendations:  As per [facility] and discussion above Other recommendations (labs, testing, etc):  none Follow up as an outpatient:  With [facility]- Dr. Meda Coffee in 2 weeks.   For questions or updates, please contact [place] Please consult [email or web address] for contact info under        Signed, Peter Martinique, MD  12/28/2018, 9:05 AM

## 2018-12-28 NOTE — Discharge Instructions (Signed)
Alcohol Use Disorder Alcohol use disorder is when your drinking disrupts your daily life. When you have this condition, you drink too much alcohol and you cannot control your drinking. Alcohol use disorder can cause serious problems with your physical health. It can affect your brain, heart, liver, pancreas, immune system, stomach, and intestines. Alcohol use disorder can increase your risk for certain cancers and cause problems with your mental health, such as depression, anxiety, psychosis, delirium, and dementia. People with this disorder risk hurting themselves and others. What are the causes? This condition is caused by drinking too much alcohol over time. It is not caused by drinking too much alcohol only one or two times. Some people with this condition drink alcohol to cope with or escape from negative life events. Others drink to relieve pain or symptoms of mental illness. What increases the risk? You are more likely to develop this condition if:  You have a family history of alcohol use disorder.  Your culture encourages drinking to the point of intoxication, or makes alcohol easy to get.  You had a mood or conduct disorder in childhood.  You have been a victim of abuse.  You are an adolescent and: ? You have poor grades or difficulties in school. ? Your caregivers do not talk to you about saying no to alcohol, or supervise your activities. ? You are impulsive or you have trouble with self-control. What are the signs or symptoms? Symptoms of this condition include:  Drinkingmore than you want to.  Drinking for longer than you want to.  Trying several times to drink less or to control your drinking.  Spending a lot of time getting alcohol, drinking, or recovering from drinking.  Craving alcohol.  Having problems at work, at school, or at home due to drinking.  Having problems in relationships due to drinking.  Drinking when it is dangerous to drink, such as before  driving a car.  Continuing to drink even though you know you might have a physical or mental problem related to drinking.  Needing more and more alcohol to get the same effect you want from the alcohol (building up tolerance).  Having symptoms of withdrawal when you stop drinking. Symptoms of withdrawal include: ? Fatigue. ? Nightmares. ? Trouble sleeping. ? Depression. ? Anxiety. ? Fever. ? Seizures. ? Severe confusion. ? Feeling or seeing things that are not there (hallucinations). ? Tremors. ? Rapid heart rate. ? Rapid breathing. ? High blood pressure.  Drinking to avoid symptoms of withdrawal. How is this diagnosed? This condition is diagnosed with an assessment. Your health care provider may start the assessment by asking three or four questions about your drinking. Your health care provider may perform a physical exam or do lab tests to see if you have physical problems resulting from alcohol use. She or he may refer you to a mental health professional for evaluation. How is this treated? Some people with alcohol use disorder are able to reduce their alcohol use to low-risk levels. Others need to completely quit drinking alcohol. When necessary, mental health professionals with specialized training in substance use treatment can help. Your health care provider can help you decide how severe your alcohol use disorder is and what type of treatment you need. The following forms of treatment are available:  Detoxification. Detoxification involves quitting drinking and using prescription medicines within the first week to help lessen withdrawal symptoms. This treatment is important for people who have had withdrawal symptoms before and for heavy drinkers  who are likely to have withdrawal symptoms. Alcohol withdrawal can be dangerous, and in severe cases, it can cause death. Detoxification may be provided in a home, community, or primary care setting, or in a hospital or substance use  treatment facility.  Counseling. This treatment is also called talk therapy. It is provided by substance use treatment counselors. A counselor can address the reasons you use alcohol and suggest ways to keep you from drinking again or to prevent problem drinking. The goals of talk therapy are to: ? Find healthy activities and ways for you to cope with stress. ? Identify and avoid the things that trigger your alcohol use. ? Help you learn how to handle cravings.  Medicines.Medicines can help treat alcohol use disorder by: ? Decreasing alcohol cravings. ? Decreasing the positive feeling you have when you drink alcohol. ? Causing an uncomfortable physical reaction when you drink alcohol (aversion therapy).  Support groups. Support groups are led by people who have quit drinking. They provide emotional support, advice, and guidance. These forms of treatment are often combined. Some people with this condition benefit from a combination of treatments provided by specialized substance use treatment centers. Follow these instructions at home:  Take over-the-counter and prescription medicines only as told by your health care provider.  Check with your health care provider before starting any new medicines.  Ask friends and family members not to offer you alcohol.  Avoid situations where alcohol is served, including gatherings where others are drinking alcohol.  Create a plan for what to do when you are tempted to use alcohol.  Find hobbies or activities that you enjoy that do not include alcohol.  Keep all follow-up visits as told by your health care provider. This is important. How is this prevented?  If you drink, limit alcohol intake to no more than 1 drink a day for nonpregnant women and 2 drinks a day for men. One drink equals 12 oz of beer, 5 oz of wine, or 1 oz of hard liquor.  If you have a mental health condition, get treatment and support.  Do not give alcohol to  adolescents.  If you are an adolescent: ? Do not drink alcohol. ? Do not be afraid to say no if someone offers you alcohol. Speak up about why you do not want to drink. You can be a positive role model for your friends and set a good example for those around you by not drinking alcohol. ? If your friends drink, spend time with others who do not drink alcohol. Make new friends who do not use alcohol. ? Find healthy ways to manage stress and emotions, such as meditation or deep breathing, exercise, spending time in nature, listening to music, or talking with a trusted friend or family member. Contact a health care provider if:  You are not able to take your medicines as told.  Your symptoms get worse.  You return to drinking alcohol (relapse) and your symptoms get worse. Get help right away if:  You have thoughts about hurting yourself or others. If you ever feel like you may hurt yourself or others, or have thoughts about taking your own life, get help right away. You can go to your nearest emergency department or call:  Your local emergency services (911 in the [place]  A suicide crisis helpline, such as the [facility] at (669)624-0393. This is open 24 hours a day. Summary  Alcohol use disorder is when your drinking disrupts your daily  life. When you have this condition, you drink too much alcohol and you cannot control your drinking.  Treatment may include detoxification, counseling, medicine, and support groups.  Ask friends and family members not to offer you alcohol. Avoid situations where alcohol is served.  Get help right away if you have thoughts about hurting yourself or others. This information is not intended to replace advice given to you by your health care provider. Make sure you discuss any questions you have with your health care provider. Document Released: 11/13/2004 Document Revised: 07/03/2016 Document Reviewed: 07/03/2016 Elsevier  Interactive Patient Education  2019 [employer].   Heart Failure  Heart failure means your heart has trouble pumping blood. This makes it hard for your body to work well. Heart failure is usually a long-term (chronic) condition. You must take good care of yourself and follow your treatment plan from your doctor. Follow these instructions at home: Medicines  Take over-the-counter and prescription medicines only as told by your doctor. ? Do not stop taking your medicine unless your doctor told you to do that. ? Do not skip any doses. ? Refill your prescriptions before you run out of medicine. You need your medicines every day. Eating and drinking   Eat heart-healthy foods. Talk with a diet and nutrition specialist (dietitian) to make an eating plan.  Choose foods that: ? Have no trans fat. ? Are low in saturated fat and cholesterol.  Choose healthy foods, like: ? Fresh or frozen fruits and vegetables. ? Fish. ? Low-fat (lean) meats. ? Legumes (like beans, peas, and lentils). ? Fat-free or low-fat dairy products. ? Whole-grain foods. ? High-fiber foods.  Limit salt (sodium) if told by your doctor. Ask your nutrition specialist to recommend heart-healthy seasonings.  Cook in healthy ways instead of frying. Healthy ways of cooking include: ? Roasting. ? Grilling. ? Broiling. ? Baking. ? Poaching. ? Steaming. ? Stir-frying.  Limit how much fluid you drink, if told by your doctor. Lifestyle  Do not smoke or use chewing tobacco. Do not use nicotine gum or patches before talking to your doctor.  Limit alcohol intake to no more than 1 drink a day for non-pregnant women and 2 drinks a day for men. One drink equals 12 oz of beer, 5 oz of wine, or 1 oz of hard liquor. ? Tell your doctor if you drink alcohol many times a week. ? Talk with your doctor about whether any alcohol is safe for you. ? You should stop drinking alcohol: ? If your heart has been damaged by  alcohol. ? You have very bad heart failure.  Do not use illegal drugs.  Lose weight if told by your doctor.  Do moderate physical activity if told by your doctor. Ask your doctor what activities are safe for you if: ? You are of older age (elderly). ? You have very bad heart failure. Keep track of important information  Weigh yourself every day. ? Weigh yourself every morning after you pee (urinate) and before breakfast. ? Wear the same amount of clothing each time. ? Write down your daily weight. Give your record to your doctor.  Check and write down your blood pressure as told by your doctor.  Check your pulse as told by your doctor. Dealing with heat and cold  If the weather is very hot: ? Avoid activity that takes a lot of energy. ? Use air conditioning or fans, or find a cooler place. ? Avoid caffeine. ? Avoid alcohol. ? Wear clothing  that is loose-fitting, lightweight, and light-colored.  If the weather is very cold: ? Avoid activity that takes a lot of energy. ? Layer your clothes. ? Wear mittens or gloves, a hat, and a scarf when you go outside. ? Avoid alcohol. General instructions  Manage other conditions that you have as told by your doctor.  Learn to manage stress. If you need help, ask your doctor.  Plan rest periods for when you get tired.  Get education and support as needed.  Get rehab (rehabilitation) to help you stay independent and to help with everyday tasks.  Stay up to date with shots (immunizations), especially pneumococcal and flu (influenza) shots.  Keep all follow-up visits as told by your doctor. This is important. Contact a doctor if:  You gain weight quickly.  You are more short of breath than normal.  You cannot do your normal activities.  You tire easily.  You cough more than normal, especially with activity.  You have any or more puffiness (swelling) in areas such as your hands, feet, ankles, or belly (abdomen).  You  cannot sleep because it is hard to breathe.  You feel like your heart is beating fast (palpitations).  You get dizzy or light-headed when you stand up. Get help right away if:  You have trouble breathing.  You or someone else notices a change in your awareness. This could be trouble staying awake or trouble concentrating.  You have chest pain or discomfort.  You pass out (faint). Summary  Heart failure means your heart has trouble pumping blood.  Make sure you refill your prescriptions before you run out of medicine. You need your medicines every day.  Keep records of your weight and blood pressure to give to your doctor.  Contact a doctor if you gain weight quickly. This information is not intended to replace advice given to you by your health care provider. Make sure you discuss any questions you have with your health care provider. Document Released: 07/15/2008 Document Revised: 06/30/2018 Document Reviewed: 10/28/2016 Elsevier Interactive Patient Education  2019 [employer].

## 2019-01-07 ENCOUNTER — Encounter (HOSPITAL_COMMUNITY): Payer: Self-pay | Admitting: Student

## 2019-01-07 ENCOUNTER — Emergency Department (HOSPITAL_COMMUNITY)
Admission: EM | Admit: 2019-01-07 | Discharge: 2019-01-08 | Disposition: A | Discharge status: Home / Self Care | Payer: Medicaid Other | Attending: Emergency Medicine | Admitting: Emergency Medicine

## 2019-01-07 ENCOUNTER — Emergency Department (HOSPITAL_COMMUNITY): Payer: Medicaid Other

## 2019-01-07 ENCOUNTER — Other Ambulatory Visit: Payer: Self-pay

## 2019-01-07 DIAGNOSIS — R079 Chest pain, unspecified: Secondary | ICD-10-CM | POA: Diagnosis present

## 2019-01-07 DIAGNOSIS — I10 Essential (primary) hypertension: Secondary | ICD-10-CM | POA: Diagnosis not present

## 2019-01-07 DIAGNOSIS — R7989 Other specified abnormal findings of blood chemistry: Secondary | ICD-10-CM | POA: Diagnosis not present

## 2019-01-07 DIAGNOSIS — F17210 Nicotine dependence, cigarettes, uncomplicated: Secondary | ICD-10-CM | POA: Insufficient documentation

## 2019-01-07 LAB — CBC WITH DIFFERENTIAL/PLATELET
Abs Immature Granulocytes: 0.03 10*3/uL (ref 0.00–0.07)
Basophils Absolute: 0 10*3/uL (ref 0.0–0.1)
Basophils Relative: 1 %
Eosinophils Absolute: 0.3 10*3/uL (ref 0.0–0.5)
Eosinophils Relative: 4 %
HCT: 42.3 % (ref 39.0–52.0)
Hemoglobin: 14.2 g/dL (ref 13.0–17.0)
Immature Granulocytes: 0 %
Lymphocytes Relative: 14 %
Lymphs Abs: 1 10*3/uL (ref 0.7–4.0)
MCH: 29 pg (ref 26.0–34.0)
MCHC: 33.6 g/dL (ref 30.0–36.0)
MCV: 86.3 fL (ref 80.0–100.0)
Monocytes Absolute: 0.6 10*3/uL (ref 0.1–1.0)
Monocytes Relative: 8 %
NEUTROS ABS: 5.3 10*3/uL (ref 1.7–7.7)
Neutrophils Relative %: 73 %
Platelets: 294 10*3/uL (ref 150–400)
RBC: 4.9 MIL/uL (ref 4.22–5.81)
RDW: 15.1 % (ref 11.5–15.5)
WBC: 7.2 10*3/uL (ref 4.0–10.5)
nRBC: 0 % (ref 0.0–0.2)

## 2019-01-07 LAB — BASIC METABOLIC PANEL
Anion gap: 14 (ref 5–15)
BUN: 7 mg/dL (ref 6–20)
CO2: 19 mmol/L — ABNORMAL LOW (ref 22–32)
Calcium: 9.1 mg/dL (ref 8.9–10.3)
Chloride: 99 mmol/L (ref 98–111)
Creatinine, Ser: 1.44 mg/dL — ABNORMAL HIGH (ref 0.61–1.24)
GFR calc Af Amer: >60 mL/min (ref >60)
GFR calc non Af Amer: 55 mL/min — ABNORMAL LOW (ref >60)
Glucose, Bld: 116 mg/dL — ABNORMAL HIGH (ref 70–99)
Potassium: 3.6 mmol/L (ref 3.5–5.1)
SODIUM: 132 mmol/L — AB (ref 135–145)

## 2019-01-07 LAB — BRAIN NATRIURETIC PEPTIDE: B Natriuretic Peptide: 16.8 pg/mL (ref 0.0–100.0)

## 2019-01-07 LAB — I-STAT TROPONIN, ED
TROPONIN I, POC: 0.01 ng/mL (ref 0.00–0.08)
Troponin i, poc: 0 ng/mL (ref 0.00–0.08)

## 2019-01-07 MED ORDER — ALUM & MAG HYDROXIDE-SIMETH 200-200-20 MG/5ML PO SUSP
30.0000 mL | Freq: Once | ORAL | Status: AC
  Administered 2019-01-07: 30 mL via ORAL
  Filled 2019-01-07: qty 30

## 2019-01-07 MED ORDER — METOPROLOL TARTRATE 5 MG/5ML IV SOLN
5.0000 mg | Freq: Once | INTRAVENOUS | Status: AC
  Administered 2019-01-07: 5 mg via INTRAVENOUS
  Filled 2019-01-07: qty 5

## 2019-01-07 MED ORDER — FENTANYL CITRATE (PF) 100 MCG/2ML IJ SOLN
50.0000 ug | Freq: Once | INTRAMUSCULAR | Status: AC
  Administered 2019-01-07: 50 ug via INTRAVENOUS
  Filled 2019-01-07: qty 2

## 2019-01-07 MED ORDER — LIDOCAINE VISCOUS HCL 2 % MT SOLN
15.0000 mL | Freq: Once | OROMUCOSAL | Status: AC
  Administered 2019-01-07: 15 mL via ORAL
  Filled 2019-01-07: qty 15

## 2019-01-07 MED ORDER — ONDANSETRON HCL 4 MG/2ML IJ SOLN
4.0000 mg | Freq: Once | INTRAMUSCULAR | Status: AC
  Administered 2019-01-07: 4 mg via INTRAVENOUS
  Filled 2019-01-07: qty 2

## 2019-01-07 MED ORDER — FAMOTIDINE 20 MG IN NS 100 ML IVPB
20.0000 mg | Freq: Once | INTRAVENOUS | Status: AC
  Administered 2019-01-07: 20 mg via INTRAVENOUS
  Filled 2019-01-07: qty 100

## 2019-01-07 MED ORDER — CARVEDILOL 12.5 MG PO TABS: 12.5000 mg | ORAL_TABLET | Freq: Once | ORAL | Status: DC

## 2019-01-07 MED ORDER — SODIUM CHLORIDE 0.9 % IV BOLUS
500.0000 mL | Freq: Once | INTRAVENOUS | Status: AC
  Administered 2019-01-07: 500 mL via INTRAVENOUS

## 2019-01-07 MED ORDER — IOHEXOL 350 MG/ML SOLN
80.0000 mL | Freq: Once | INTRAVENOUS | Status: AC | PRN
  Administered 2019-01-07: 80 mL via INTRAVENOUS

## 2019-01-07 MED ORDER — MORPHINE SULFATE (PF) 4 MG/ML IV SOLN
4.0000 mg | Freq: Once | INTRAVENOUS | Status: AC
  Administered 2019-01-07: 4 mg via INTRAVENOUS
  Filled 2019-01-07: qty 1

## 2019-01-07 NOTE — ED Triage Notes (Signed)
Pt bib [facility] from home d/t chest pain.  Pt released from hospital 5 days ago.  Pt given asa 324 mg and 1 nitro tablet en route.  +shob.  Chest pain reproducible.
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
## 2019-01-08 ENCOUNTER — Encounter (HOSPITAL_COMMUNITY): Payer: Self-pay | Admitting: Student

## 2019-01-08 MED ORDER — FAMOTIDINE 20 MG PO TABS: 20.0000 mg | ORAL_TABLET | Freq: Two times a day (BID) | ORAL | 0 refills | Status: DC

## 2019-01-08 MED ORDER — SUCRALFATE 1 GM/10ML PO SUSP: 1.0000 g | Freq: Three times a day (TID) | ORAL | 0 refills | Status: DC

## 2019-01-08 NOTE — Discharge Instructions (Signed)
You were seen in the ER for chest pain. Your heart enzyme did not show an acute heart attack. Your labs were all fairly normal, except your kidney function was elevated (serum creatinine) this will need primary care recheck in 1-3 days. Your CT scan did not show a blood clot it did show some inflammation of the esophagus- we are treating this with carafate and pepcid- take this medicines as prescribed.   We have prescribed you new medication(s) today. Discuss the medications prescribed today with your pharmacist as they can have adverse effects and interactions with your other medicines including over the counter and prescribed medications. Seek medical evaluation if you start to experience new or abnormal symptoms after taking one of these medicines, seek care immediately if you start to experience difficulty breathing, feeling of your throat closing, facial swelling, or rash as these could be indications of a more serious allergic reaction  Please be sure to obtain your prescriptions from prior hospitalization and take these as prescribed.   Follow up with primary care within 1-3 days. Return to the ER for new or worsening symptoms including but not limited to worsened pain, blood in stool or in vomit, abdominal pain, fever, or any other concerns.

## 2019-01-19 DIAGNOSIS — F10939 Alcohol use, unspecified with withdrawal, unspecified: Secondary | ICD-10-CM

## 2019-01-19 DIAGNOSIS — F10239 Alcohol dependence with withdrawal, unspecified: Secondary | ICD-10-CM

## 2019-01-19 HISTORY — DX: Alcohol dependence with withdrawal, unspecified: F10.239

## 2019-01-19 HISTORY — DX: Alcohol use, unspecified with withdrawal, unspecified: F10.939

## 2019-02-08 ENCOUNTER — Inpatient Hospital Stay (HOSPITAL_COMMUNITY)
Admission: EM | Admit: 2019-02-08 | Discharge: 2019-02-10 | DRG: 897 | Disposition: A | Discharge status: Home / Self Care | Payer: Medicaid Other | Attending: Internal Medicine | Admitting: Internal Medicine

## 2019-02-08 ENCOUNTER — Other Ambulatory Visit: Payer: Self-pay

## 2019-02-08 ENCOUNTER — Encounter (HOSPITAL_COMMUNITY): Payer: Self-pay

## 2019-02-08 ENCOUNTER — Emergency Department (HOSPITAL_COMMUNITY): Payer: Medicaid Other

## 2019-02-08 DIAGNOSIS — I426 Alcoholic cardiomyopathy: Secondary | ICD-10-CM | POA: Diagnosis present

## 2019-02-08 DIAGNOSIS — I5022 Chronic systolic (congestive) heart failure: Secondary | ICD-10-CM | POA: Diagnosis present

## 2019-02-08 DIAGNOSIS — F10939 Alcohol use, unspecified with withdrawal, unspecified: Secondary | ICD-10-CM | POA: Diagnosis present

## 2019-02-08 DIAGNOSIS — F17200 Nicotine dependence, unspecified, uncomplicated: Secondary | ICD-10-CM | POA: Diagnosis present

## 2019-02-08 DIAGNOSIS — Z6832 Body mass index (BMI) 32.0-32.9, adult: Secondary | ICD-10-CM

## 2019-02-08 DIAGNOSIS — R0789 Other chest pain: Secondary | ICD-10-CM | POA: Diagnosis present

## 2019-02-08 DIAGNOSIS — F319 Bipolar disorder, unspecified: Secondary | ICD-10-CM | POA: Diagnosis present

## 2019-02-08 DIAGNOSIS — K648 Other hemorrhoids: Secondary | ICD-10-CM | POA: Diagnosis present

## 2019-02-08 DIAGNOSIS — F17210 Nicotine dependence, cigarettes, uncomplicated: Secondary | ICD-10-CM | POA: Diagnosis present

## 2019-02-08 DIAGNOSIS — F10930 Alcohol use, unspecified with withdrawal, uncomplicated: Secondary | ICD-10-CM

## 2019-02-08 DIAGNOSIS — Z886 Allergy status to analgesic agent status: Secondary | ICD-10-CM

## 2019-02-08 DIAGNOSIS — K219 Gastro-esophageal reflux disease without esophagitis: Secondary | ICD-10-CM | POA: Diagnosis present

## 2019-02-08 DIAGNOSIS — F1020 Alcohol dependence, uncomplicated: Secondary | ICD-10-CM | POA: Diagnosis present

## 2019-02-08 DIAGNOSIS — E669 Obesity, unspecified: Secondary | ICD-10-CM | POA: Diagnosis present

## 2019-02-08 DIAGNOSIS — Z880 Allergy status to penicillin: Secondary | ICD-10-CM

## 2019-02-08 DIAGNOSIS — E860 Dehydration: Secondary | ICD-10-CM | POA: Diagnosis present

## 2019-02-08 DIAGNOSIS — F10239 Alcohol dependence with withdrawal, unspecified: Secondary | ICD-10-CM | POA: Diagnosis present

## 2019-02-08 DIAGNOSIS — F10230 Alcohol dependence with withdrawal, uncomplicated: Principal | ICD-10-CM | POA: Diagnosis present

## 2019-02-08 DIAGNOSIS — Z8711 Personal history of peptic ulcer disease: Secondary | ICD-10-CM

## 2019-02-08 DIAGNOSIS — G4733 Obstructive sleep apnea (adult) (pediatric): Secondary | ICD-10-CM | POA: Diagnosis present

## 2019-02-08 DIAGNOSIS — I110 Hypertensive heart disease with heart failure: Secondary | ICD-10-CM | POA: Diagnosis present

## 2019-02-08 DIAGNOSIS — I10 Essential (primary) hypertension: Secondary | ICD-10-CM | POA: Diagnosis present

## 2019-02-08 DIAGNOSIS — Z79899 Other long term (current) drug therapy: Secondary | ICD-10-CM

## 2019-02-08 HISTORY — DX: Alcohol dependence with withdrawal, unspecified: F10.239

## 2019-02-08 LAB — CBC
HCT: 37.5 % — ABNORMAL LOW (ref 39.0–52.0)
Hemoglobin: 12.8 g/dL — ABNORMAL LOW (ref 13.0–17.0)
MCH: 29.3 pg (ref 26.0–34.0)
MCHC: 34.1 g/dL (ref 30.0–36.0)
MCV: 85.8 fL (ref 80.0–100.0)
Platelets: 226 10*3/uL (ref 150–400)
RBC: 4.37 MIL/uL (ref 4.22–5.81)
RDW: 15.9 % — ABNORMAL HIGH (ref 11.5–15.5)
WBC: 7.9 10*3/uL (ref 4.0–10.5)
nRBC: 0 % (ref 0.0–0.2)

## 2019-02-08 LAB — URINALYSIS, ROUTINE W REFLEX MICROSCOPIC
Bilirubin Urine: NEGATIVE
Glucose, UA: NEGATIVE mg/dL
Ketones, ur: 80 mg/dL — AB
Leukocytes,Ua: NEGATIVE
Nitrite: NEGATIVE
Protein, ur: 100 mg/dL — AB
Specific Gravity, Urine: 1.017 (ref 1.005–1.030)
pH: 5 (ref 5.0–8.0)

## 2019-02-08 LAB — LIPASE, BLOOD: Lipase: 53 U/L — ABNORMAL HIGH (ref 11–51)

## 2019-02-08 LAB — COMPREHENSIVE METABOLIC PANEL
ALT: 37 U/L (ref 0–44)
AST: 61 U/L — ABNORMAL HIGH (ref 15–41)
Albumin: 4.2 g/dL (ref 3.5–5.0)
Alkaline Phosphatase: 67 U/L (ref 38–126)
Anion gap: 20 — ABNORMAL HIGH (ref 5–15)
BUN: 5 mg/dL — ABNORMAL LOW (ref 6–20)
CO2: 20 mmol/L — ABNORMAL LOW (ref 22–32)
Calcium: 9.1 mg/dL (ref 8.9–10.3)
Chloride: 92 mmol/L — ABNORMAL LOW (ref 98–111)
Creatinine, Ser: 0.89 mg/dL (ref 0.61–1.24)
GFR calc Af Amer: >60 mL/min (ref >60)
GFR calc non Af Amer: >60 mL/min (ref >60)
Glucose, Bld: 92 mg/dL (ref 70–99)
Potassium: 3.5 mmol/L (ref 3.5–5.1)
Sodium: 132 mmol/L — ABNORMAL LOW (ref 135–145)
Total Bilirubin: 1 mg/dL (ref 0.3–1.2)
Total Protein: 7.4 g/dL (ref 6.5–8.1)

## 2019-02-08 LAB — RAPID URINE DRUG SCREEN, HOSP PERFORMED
Amphetamines: NOT DETECTED
Barbiturates: NOT DETECTED
Benzodiazepines: NOT DETECTED
Cocaine: NOT DETECTED
Opiates: NOT DETECTED
Tetrahydrocannabinol: NOT DETECTED

## 2019-02-08 LAB — BRAIN NATRIURETIC PEPTIDE: B Natriuretic Peptide: 20.6 pg/mL (ref 0.0–100.0)

## 2019-02-08 LAB — TROPONIN I: Troponin I: <0.03 ng/mL (ref <0.03)

## 2019-02-08 LAB — ETHANOL: Alcohol, Ethyl (B): <10 mg/dL (ref <10)

## 2019-02-08 MED ORDER — SUCRALFATE 1 GM/10ML PO SUSP
1.0000 g | Freq: Three times a day (TID) | ORAL | Status: DC
  Administered 2019-02-08 – 2019-02-10 (×9): 1 g via ORAL
  Filled 2019-02-08 (×8): qty 10

## 2019-02-08 MED ORDER — LORAZEPAM 1 MG PO TABS: 0.0000 mg | ORAL_TABLET | Freq: Two times a day (BID) | ORAL | Status: DC

## 2019-02-08 MED ORDER — ALUM & MAG HYDROXIDE-SIMETH 200-200-20 MG/5ML PO SUSP
30.0000 mL | Freq: Once | ORAL | Status: AC
  Administered 2019-02-08: 06:00:00 30 mL via ORAL
  Filled 2019-02-08: qty 30

## 2019-02-08 MED ORDER — ATORVASTATIN CALCIUM 40 MG PO TABS
40.0000 mg | ORAL_TABLET | Freq: Every day | ORAL | Status: DC
  Administered 2019-02-08 – 2019-02-09 (×2): 40 mg via ORAL
  Filled 2019-02-08 (×2): qty 1

## 2019-02-08 MED ORDER — LORAZEPAM 1 MG PO TABS: 0.0000 mg | ORAL_TABLET | Freq: Four times a day (QID) | ORAL | Status: DC

## 2019-02-08 MED ORDER — ONDANSETRON HCL 4 MG PO TABS: 4.0000 mg | ORAL_TABLET | Freq: Four times a day (QID) | ORAL | Status: DC | PRN

## 2019-02-08 MED ORDER — LORAZEPAM 2 MG/ML IJ SOLN: 1.0000 mg | Freq: Four times a day (QID) | INTRAMUSCULAR | Status: DC | PRN

## 2019-02-08 MED ORDER — LOSARTAN POTASSIUM 50 MG PO TABS
50.0000 mg | ORAL_TABLET | Freq: Every day | ORAL | Status: DC
  Administered 2019-02-08 – 2019-02-10 (×3): 50 mg via ORAL
  Filled 2019-02-08 (×3): qty 1

## 2019-02-08 MED ORDER — ACETAMINOPHEN 325 MG PO TABS
650.0000 mg | ORAL_TABLET | Freq: Four times a day (QID) | ORAL | Status: DC | PRN
  Administered 2019-02-08 – 2019-02-10 (×2): 650 mg via ORAL
  Filled 2019-02-08 (×2): qty 2

## 2019-02-08 MED ORDER — THIAMINE HCL 100 MG/ML IJ SOLN
100.0000 mg | Freq: Every day | INTRAMUSCULAR | Status: DC
  Administered 2019-02-08: 05:00:00 100 mg via INTRAVENOUS
  Filled 2019-02-08: qty 2

## 2019-02-08 MED ORDER — ACETAMINOPHEN 650 MG RE SUPP: 650.0000 mg | Freq: Four times a day (QID) | RECTAL | Status: DC | PRN

## 2019-02-08 MED ORDER — LORAZEPAM 1 MG PO TABS: 1.0000 mg | ORAL_TABLET | Freq: Four times a day (QID) | ORAL | Status: DC | PRN

## 2019-02-08 MED ORDER — ONDANSETRON HCL 4 MG/2ML IJ SOLN: 4.0000 mg | Freq: Four times a day (QID) | INTRAMUSCULAR | Status: DC | PRN

## 2019-02-08 MED ORDER — VITAMIN B-1 100 MG PO TABS: 100.0000 mg | ORAL_TABLET | Freq: Every day | ORAL | Status: DC

## 2019-02-08 MED ORDER — LORAZEPAM 1 MG PO TABS
0.0000 mg | ORAL_TABLET | Freq: Four times a day (QID) | ORAL | Status: DC
  Administered 2019-02-08: 08:00:00 2 mg via ORAL
  Filled 2019-02-08: qty 2

## 2019-02-08 MED ORDER — SODIUM CHLORIDE 0.9 % IV BOLUS
1000.0000 mL | Freq: Once | INTRAVENOUS | Status: AC
  Administered 2019-02-08: 06:00:00 1000 mL via INTRAVENOUS

## 2019-02-08 MED ORDER — LACTATED RINGERS IV SOLN
INTRAVENOUS | Status: AC
  Administered 2019-02-08: 12:00:00 via INTRAVENOUS

## 2019-02-08 MED ORDER — DOCUSATE SODIUM 100 MG PO CAPS
100.0000 mg | ORAL_CAPSULE | Freq: Two times a day (BID) | ORAL | Status: DC
  Administered 2019-02-08 – 2019-02-10 (×5): 100 mg via ORAL
  Filled 2019-02-08 (×5): qty 1

## 2019-02-08 MED ORDER — FOLIC ACID 1 MG PO TABS: 1.0000 mg | ORAL_TABLET | Freq: Every day | ORAL | Status: DC

## 2019-02-08 MED ORDER — SODIUM CHLORIDE 0.9% FLUSH
3.0000 mL | Freq: Once | INTRAVENOUS | Status: AC
  Administered 2019-02-08: 3 mL via INTRAVENOUS

## 2019-02-08 MED ORDER — THIAMINE HCL 100 MG/ML IJ SOLN: 100.0000 mg | Freq: Every day | INTRAMUSCULAR | Status: DC

## 2019-02-08 MED ORDER — PANTOPRAZOLE SODIUM 40 MG IV SOLR
40.0000 mg | Freq: Two times a day (BID) | INTRAVENOUS | Status: DC
  Administered 2019-02-08 – 2019-02-09 (×3): 40 mg via INTRAVENOUS
  Filled 2019-02-08 (×3): qty 40

## 2019-02-08 MED ORDER — LORAZEPAM 2 MG/ML IJ SOLN: 0.0000 mg | Freq: Four times a day (QID) | INTRAMUSCULAR | Status: DC

## 2019-02-08 MED ORDER — CARVEDILOL 12.5 MG PO TABS
12.5000 mg | ORAL_TABLET | Freq: Two times a day (BID) | ORAL | Status: DC
  Administered 2019-02-08 – 2019-02-10 (×5): 12.5 mg via ORAL
  Filled 2019-02-08 (×5): qty 1

## 2019-02-08 MED ORDER — FOLIC ACID 1 MG PO TABS
1.0000 mg | ORAL_TABLET | Freq: Every day | ORAL | Status: DC
  Administered 2019-02-08 – 2019-02-10 (×3): 1 mg via ORAL
  Filled 2019-02-08 (×3): qty 1

## 2019-02-08 MED ORDER — LORAZEPAM 1 MG PO TABS
0.0000 mg | ORAL_TABLET | Freq: Four times a day (QID) | ORAL | Status: AC
  Administered 2019-02-08 – 2019-02-10 (×8): 1 mg via ORAL
  Filled 2019-02-08 (×8): qty 1

## 2019-02-08 MED ORDER — ADULT MULTIVITAMIN W/MINERALS CH: 1.0000 | ORAL_TABLET | Freq: Every day | ORAL | Status: DC

## 2019-02-08 MED ORDER — SPIRONOLACTONE 25 MG PO TABS: 25.0000 mg | ORAL_TABLET | Freq: Every day | ORAL | Status: DC

## 2019-02-08 MED ORDER — SPIRONOLACTONE 25 MG PO TABS
25.0000 mg | ORAL_TABLET | Freq: Every day | ORAL | Status: DC
  Administered 2019-02-09 – 2019-02-10 (×2): 25 mg via ORAL
  Filled 2019-02-08 (×2): qty 1

## 2019-02-08 MED ORDER — DULOXETINE HCL 30 MG PO CPEP
30.0000 mg | ORAL_CAPSULE | Freq: Every day | ORAL | Status: DC
  Administered 2019-02-08 – 2019-02-10 (×3): 30 mg via ORAL
  Filled 2019-02-08 (×3): qty 1

## 2019-02-08 MED ORDER — LORAZEPAM 2 MG/ML IJ SOLN
1.0000 mg | Freq: Once | INTRAMUSCULAR | Status: AC
  Administered 2019-02-08: 1 mg via INTRAVENOUS
  Filled 2019-02-08: qty 1

## 2019-02-08 MED ORDER — LORAZEPAM 2 MG/ML IJ SOLN
1.0000 mg | Freq: Once | INTRAMUSCULAR | Status: AC
  Administered 2019-02-08: 05:00:00 1 mg via INTRAVENOUS
  Filled 2019-02-08: qty 1

## 2019-02-08 MED ORDER — ADULT MULTIVITAMIN W/MINERALS CH
1.0000 | ORAL_TABLET | Freq: Every day | ORAL | Status: DC
  Administered 2019-02-08 – 2019-02-10 (×3): 1 via ORAL
  Filled 2019-02-08 (×3): qty 1

## 2019-02-08 MED ORDER — ONDANSETRON HCL 4 MG/2ML IJ SOLN
4.0000 mg | Freq: Once | INTRAMUSCULAR | Status: AC | PRN
  Administered 2019-02-08: 4 mg via INTRAVENOUS
  Filled 2019-02-08: qty 2

## 2019-02-08 MED ORDER — VITAMIN B-1 100 MG PO TABS
100.0000 mg | ORAL_TABLET | Freq: Every day | ORAL | Status: DC
  Administered 2019-02-08 – 2019-02-10 (×3): 100 mg via ORAL
  Filled 2019-02-08 (×3): qty 1

## 2019-02-08 NOTE — ED Notes (Signed)
ED Provider at bedside. 
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
## 2019-02-08 NOTE — ED Notes (Signed)
BREAKFAST ORDERED

## 2019-02-08 NOTE — ED Notes (Addendum)
Patient transported to X-ray 

## 2019-02-08 NOTE — ED Triage Notes (Signed)
Pt arrived via EMS with c/o chest pain that feels like stabbing and burning x 1 day with vomiting and shakes; Pt is tachy on arrival and has diaphoretic; Pt a&ox 4 on arrival. MD notified of patient's arrival;-Monique,RN

## 2019-02-08 NOTE — ED Notes (Signed)
Patient transported to MRI 

## 2019-02-09 DIAGNOSIS — Z886 Allergy status to analgesic agent status: Secondary | ICD-10-CM | POA: Diagnosis not present

## 2019-02-09 DIAGNOSIS — Z79899 Other long term (current) drug therapy: Secondary | ICD-10-CM | POA: Diagnosis not present

## 2019-02-09 DIAGNOSIS — G4733 Obstructive sleep apnea (adult) (pediatric): Secondary | ICD-10-CM | POA: Diagnosis present

## 2019-02-09 DIAGNOSIS — E860 Dehydration: Secondary | ICD-10-CM | POA: Diagnosis present

## 2019-02-09 DIAGNOSIS — I5022 Chronic systolic (congestive) heart failure: Secondary | ICD-10-CM | POA: Diagnosis present

## 2019-02-09 DIAGNOSIS — Z8711 Personal history of peptic ulcer disease: Secondary | ICD-10-CM | POA: Diagnosis not present

## 2019-02-09 DIAGNOSIS — I110 Hypertensive heart disease with heart failure: Secondary | ICD-10-CM | POA: Diagnosis present

## 2019-02-09 DIAGNOSIS — Z6832 Body mass index (BMI) 32.0-32.9, adult: Secondary | ICD-10-CM | POA: Diagnosis not present

## 2019-02-09 DIAGNOSIS — K648 Other hemorrhoids: Secondary | ICD-10-CM | POA: Diagnosis present

## 2019-02-09 DIAGNOSIS — F10230 Alcohol dependence with withdrawal, uncomplicated: Secondary | ICD-10-CM | POA: Diagnosis not present

## 2019-02-09 DIAGNOSIS — I426 Alcoholic cardiomyopathy: Secondary | ICD-10-CM | POA: Diagnosis present

## 2019-02-09 DIAGNOSIS — F319 Bipolar disorder, unspecified: Secondary | ICD-10-CM | POA: Diagnosis present

## 2019-02-09 DIAGNOSIS — K219 Gastro-esophageal reflux disease without esophagitis: Secondary | ICD-10-CM | POA: Diagnosis present

## 2019-02-09 DIAGNOSIS — R0789 Other chest pain: Secondary | ICD-10-CM | POA: Diagnosis not present

## 2019-02-09 DIAGNOSIS — E669 Obesity, unspecified: Secondary | ICD-10-CM | POA: Diagnosis present

## 2019-02-09 DIAGNOSIS — Z880 Allergy status to penicillin: Secondary | ICD-10-CM | POA: Diagnosis not present

## 2019-02-09 DIAGNOSIS — F17210 Nicotine dependence, cigarettes, uncomplicated: Secondary | ICD-10-CM | POA: Diagnosis present

## 2019-02-09 LAB — CBC
HCT: 36.4 % — ABNORMAL LOW (ref 39.0–52.0)
Hemoglobin: 12.4 g/dL — ABNORMAL LOW (ref 13.0–17.0)
MCH: 29.8 pg (ref 26.0–34.0)
MCHC: 34.1 g/dL (ref 30.0–36.0)
MCV: 87.5 fL (ref 80.0–100.0)
Platelets: 178 10*3/uL (ref 150–400)
RBC: 4.16 MIL/uL — ABNORMAL LOW (ref 4.22–5.81)
RDW: 15.7 % — ABNORMAL HIGH (ref 11.5–15.5)
WBC: 4 10*3/uL (ref 4.0–10.5)
nRBC: 0 % (ref 0.0–0.2)

## 2019-02-09 LAB — BASIC METABOLIC PANEL
Anion gap: 13 (ref 5–15)
BUN: <5 mg/dL — ABNORMAL LOW (ref 6–20)
CO2: 22 mmol/L (ref 22–32)
Calcium: 8.7 mg/dL — ABNORMAL LOW (ref 8.9–10.3)
Chloride: 99 mmol/L (ref 98–111)
Creatinine, Ser: 0.98 mg/dL (ref 0.61–1.24)
GFR calc Af Amer: >60 mL/min (ref >60)
GFR calc non Af Amer: >60 mL/min (ref >60)
Glucose, Bld: 123 mg/dL — ABNORMAL HIGH (ref 70–99)
Potassium: 3.5 mmol/L (ref 3.5–5.1)
Sodium: 134 mmol/L — ABNORMAL LOW (ref 135–145)

## 2019-02-09 MED ORDER — PANTOPRAZOLE SODIUM 40 MG PO TBEC
40.0000 mg | DELAYED_RELEASE_TABLET | Freq: Two times a day (BID) | ORAL | Status: DC
  Administered 2019-02-09 – 2019-02-10 (×2): 40 mg via ORAL
  Filled 2019-02-09 (×2): qty 1

## 2019-02-09 NOTE — Progress Notes (Signed)
Progress Note    Jacob Adams  EPP:[id number] DOB: 1966-07-05  DOA: 02/08/2019 PCP: Patient, No Pcp Per    Brief Narrative:     Medical records reviewed and are as summarized below:  Jacob Adams is an 53 y.o. male with medical history significant of OSA; bipolar; HTN; ETOH dependence; and hospitalization in 3/20 for idiopathic chest pain with new diagnosis of systolic CHF presenting with chest pain. He reports he couldn't stop shaking, his chest was hurting bad, he had a headache, his heart felt like it was beating funny.  Symptoms started last night.  He had similar symptoms with his last presentation.  His last drink was 2 days ago and he is trying to quit.  He has been feeling anxious/jittery.  Assessment/Plan:   Principal Problem:   Alcohol withdrawal (HCC) Active Problems:   Alcohol use disorder, severe, dependence (HCC)   Non-cardiac chest pain   HTN (hypertension)   Chronic systolic CHF (congestive heart failure) (HCC)   Dehydration   Tobacco dependence  ETOH dependence with withdrawal -Patient with chronic ETOH dependence -Recently hospitalized with similar complaints, and it appears he is genuinely trying to stop drinking -Last drink was 2 days ago -He is at increased risk for complications of withdrawal including seizures, DTs -CIWA protocol- PRN and scheduled -SW consult for possible inpatient treatment - prefers assistance through the [place] -Elevated LFTs are likely related to alcoholism  Chronic systolic CHF with non-cardiac chest pain -Recent cardiac cath with no evidence of CAD -3/8 echo with EF 25% and diffuse hypokinesis, 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    obesity Body mass index is 32.33 kg/m.  GI bleeding -probably hemorrhoidal bleeding -bowel regimen  Family Communication/Anticipated D/C date and plan/Code Status   DVT prophylaxis: scd Code Status: Full Code.  Family Communication:  Disposition Plan: prior to d/c: needs to be scoring lower on CIWA and be seen by Child psychotherapist--- he is high risk for alcohol withdrawal complications   Medical Consultants:    Social work  Subjective:   Has not spoken with social worker yet regarding treatment options for substance abuse  Objective:    Vitals:   02/08/19 1615 02/08/19 1918 02/09/19 0003 02/09/19 0412  BP: 118/85 122/86 (!) 102/42 (!) 135/92  Pulse: 95 91 94 87  Resp: 16 18  18   Temp: 98.9 F (37.2 C) 99.2 F (37.3 C) 98.6 F (37 C) 99 F (37.2 C)  TempSrc: Oral Oral Oral Oral  SpO2: 95% 97% 97% 95%  Weight:    102.2 kg  Height:        Intake/Output Summary (Last 24 hours) at 02/09/2019 0936 Last data filed at 02/09/2019 0800 Gross per 24 hour  Intake 2693.62 ml  Output 651 ml  Net 2042.62 ml   Filed Weights   02/08/19 0819 02/09/19 0412  Weight: 102.8 kg 102.2 kg    Exam: In bed, tremulous Alert and oriented rrr No increased work of breathing No LE edema  Data Reviewed:   I have personally reviewed following labs and imaging studies:  Labs: Labs show the following:   Basic Metabolic Panel: Recent Labs  Lab 02/08/19 0428 02/09/19 0419  NA 132* 134*  K 3.5 3.5  CL 92* 99  CO2 20* 22  GLUCOSE 92 123*  BUN 5* <5*  CREATININE 0.89 0.98  CALCIUM 9.1 8.7*   GFR Estimated Creatinine Clearance: 104.4 mL/min (by C-G formula based on SCr of 0.98 mg/dL). Liver Function Tests: Recent Labs  Lab 02/08/19 0428  AST 61*  ALT 37  ALKPHOS 67  BILITOT 1.0  PROT 7.4  ALBUMIN 4.2   Recent Labs  Lab 02/08/19 0428  LIPASE 53*   No results for input(s): AMMONIA in the last 168 hours. Coagulation profile No results  for input(s): INR, PROTIME in the last 168 hours.  CBC: Recent Labs  Lab 02/08/19 0428 02/09/19 0419  WBC 7.9 4.0  HGB 12.8* 12.4*  HCT 37.5* 36.4*  MCV 85.8 87.5  PLT 226 178   Cardiac Enzymes: Recent Labs  Lab 02/08/19 0428  TROPONINI <0.03   BNP (last 3 results) No results for input(s): PROBNP in the last 8760 hours. CBG: No results for input(s): GLUCAP in the last 168 hours. D-Dimer: No results for input(s): DDIMER in the last 72 hours. Hgb A1c: No results for input(s): HGBA1C in the last 72 hours. Lipid Profile: No results for input(s): CHOL, HDL, LDLCALC, TRIG, CHOLHDL, LDLDIRECT in the last 72 hours. Thyroid function studies: No results for input(s): TSH, T4TOTAL, T3FREE, THYROIDAB in the last 72 hours.  Invalid input(s): FREET3 Anemia work up: No results for input(s): VITAMINB12, FOLATE, FERRITIN, TIBC, IRON, RETICCTPCT in the last 72 hours. Sepsis Labs: Recent Labs  Lab 02/08/19 0428 02/09/19 0419  WBC 7.9 4.0    Microbiology No results found for this or any previous visit (from the past 240 hour(s)).  Procedures and diagnostic studies:  Dg Chest 2 View  Result Date: 02/08/2019 CLINICAL DATA:  53 year old male with chest pain. EXAM: CHEST - 2 VIEW COMPARISON:  CTA chest 01/07/2019 and earlier. FINDINGS: Upright AP and lateral views. Stable lung volumes and mediastinal contours. Mild chronic curvilinear scarring in the lingula as demonstrated on the prior CTA. No pneumothorax, pulmonary edema, pleural effusion or acute pulmonary opacity. Visualized tracheal air column is within normal limits. Partially visible humerus ORIF. No acute osseous abnormality identified. Negative visible bowel gas pattern. IMPRESSION: No acute cardiopulmonary abnormality. Electronically Signed   By: Odessa Fleming M.D.   On: 02/08/2019 05:03    Medications:   . atorvastatin  40 mg Oral QHS  . carvedilol  12.5 mg Oral BID WC  . docusate sodium  100 mg Oral BID  . DULoxetine  30 mg  Oral Daily  . folic acid  1 mg Oral Daily  . LORazepam  0-4 mg Oral Q6H   Followed by  . [START ON 02/10/2019] LORazepam  0-4 mg Oral Q12H  . losartan  50 mg Oral Daily  . multivitamin with minerals  1 tablet Oral Daily  . pantoprazole (PROTONIX) IV  40 mg Intravenous Q12H  . spironolactone  25 mg Oral Daily  . sucralfate  1 g Oral TID WC & HS  . thiamine  100 mg Oral Daily   Continuous Infusions:   LOS: 0 days   Joseph Art  [facility]   How to contact the [facility] Attending or Consulting provider 7A - 7P or covering provider during after hours 7P -7A, for this patient?  1. Check the care team in [facility] and look for a) attending/consulting TRH provider listed and b) the [facility] team listed 2. Log into [email or web address] and use [place]'s universal password to access. If you do  not have the password, please contact the hospital operator. 3. Locate the [facility] provider you are looking for under [facility] and page to a number that you can be directly reached. 4. If you still have difficulty reaching the provider, please page the [facility] (Director on Call) for the Hospitalists listed on amion for assistance.  02/09/2019, 9:36 AM

## 2019-02-09 NOTE — Progress Notes (Signed)
Pt has 1 Bowel movement on my shift. Noted moderate redness that seem blood in the toilet.  Pt reported he had 1 prior to ED.  blood works ordered this AM. VS stable. No complains. Will monitor.

## 2019-02-10 NOTE — Clinical Social Work Note (Signed)
CSW provided SA resources at bedside.   [place], [place]

## 2019-02-10 NOTE — Progress Notes (Signed)
Patient discharged: Home   Via: Wheelchair   Discharge paperwork given: to patient   Reviewed with teach back  IV and telemetry disconnected  Belongings given to patient

## 2019-02-10 NOTE — TOC Transition Note (Addendum)
Transition of Care [facility]) - CM/SW Discharge Note   Patient Details  Name: Jacob Adams MRN: [id number] Date of Birth: 06/24/1966  Transition of Care [facility]) CM/SW Contact:  Royston Bake, RN ,[place] Phone Number: [phone] 02/10/2019, 10:30 AM   Clinical Narrative:    CM talked to patient and spouse; patient goes to the [facility] for medical care / medication; secondary pharmacy is [facility]; pt is requesting that his DC summary be faxed to his PCP at the [facility] fax # [phone];  CM talked to patient about ETOH abuse; he admits to drinking 2 - 40 ounces a day; he does not want to talk to the Social Worker about this but plans to follow up at the [place]; CM strongly encouraged pt to discuss this with his PCP and enroll in some of the programs that are offered at the [place].  12:25 pm - DC summary faxed to the [place] as requested. B Amanii Snethen RN,MHA,BSN   Final next level of care: Home/Self Care Barriers to Discharge: No Barriers Identified   Patient Goals and CMS Choice Patient states their goals for this hospitalization and ongoing recovery are:: to stay at home CMS Medicare.gov Compare Post Acute Care list provided to:: Patient    Discharge Placement                       Discharge Plan and Services In-house Referral: NA              DME Arranged: N/A DME Agency: NA       HH Arranged: NA HH Agency: NA        Social Determinants of Health (SDOH) Interventions     Readmission Risk Interventions No flowsheet data found.

## 2019-02-10 NOTE — Discharge Summary (Signed)
Physician Discharge Summary  Jacob Adams YQM:[id number] DOB: 05-Nov-1965 DOA: 02/08/2019  PCP: Patient, No Pcp Per  Admit date: 02/08/2019 Discharge date: 02/10/2019  Admitted From: home Discharge disposition: home   Recommendations for Outpatient Follow-Up:   1. Bowel regimen and GI follow up 2. Alcohol treatment facility referral   Discharge Diagnosis:   Principal Problem:   Alcohol withdrawal (HCC) Active Problems:   Alcohol use disorder, severe, dependence (HCC)   Non-cardiac chest pain   HTN (hypertension)   Chronic systolic CHF (congestive heart failure) (HCC)   Dehydration   Tobacco dependence    Discharge Condition: Improved.  Diet recommendation: Low sodium, heart healthy  Wound care: None.  Code status: Full.   History of Present Illness:   Jacob Adams is a 53 y.o. male with medical history significant of OSA; bipolar; HTN; ETOH dependence; and hospitalization in 3/20 for idiopathic chest pain with new diagnosis of systolic CHF presenting with chest pain. He reports he couldn't stop shaking, his chest was hurting bad, he had a headache, his heart felt like it was beating funny.  Symptoms started last night.  He had similar symptoms with his last presentation.  His last drink was 2 days ago and he is trying to quit.  He has been feeling anxious/jittery.  His longest period was about 1 week.  His has had withdrawal with feeling anxious, jittery, unable to sit down during prior episodes.  +n/v with prior w/d.  He is having nausea/vomiting this time.  He had bright red blood with a BM last night.  He is still having left-sided/substernal chest pain.  Nothing makes it better.  It hurts worse with lying flat.   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   obesity Body mass index is 32.33 kg/m.  GI bleeding -probably hemorrhoidal bleeding -bowel regimen -outpatient follow up -h/h stable    Medical Consultants:      Discharge Exam:   Vitals:   02/10/19 0752 02/10/19 1120  BP: 129/88 (!) 141/104  Pulse: 85 70  Resp: 18 20  Temp: 98.7 F (37.1 C) 98.1 F (36.7 C)  SpO2: 96% 99%   Vitals:   02/10/19 0300 02/10/19 0502 02/10/19 0752 02/10/19 1120  BP:  (!) 134/93 129/88 (!) 141/104  Pulse:  77 85 70  Resp:  20 18 20   Temp:  98.6 F (37 C) 98.7 F (37.1 C) 98.1 F (36.7 C)  TempSrc:  Oral Oral Oral  SpO2:  97% 96% 99%  Weight: 103 kg     Height:        General exam: Appears calm and comfortable. No longer having tremors   The results of significant diagnostics from this hospitalization (including imaging, microbiology, ancillary and laboratory) are listed below for reference.  Procedures and Diagnostic Studies:   Dg Chest 2 View  Result Date: 02/08/2019 CLINICAL DATA:  53 year old male with chest pain. EXAM: CHEST - 2 VIEW COMPARISON:  CTA chest 01/07/2019 and earlier. FINDINGS: Upright AP and lateral views. Stable lung volumes and mediastinal contours. Mild chronic curvilinear scarring in the lingula as demonstrated on the prior CTA. No pneumothorax, pulmonary edema, pleural effusion or acute pulmonary opacity.  Visualized tracheal air column is within normal limits. Partially visible humerus ORIF. No acute osseous abnormality identified. Negative visible bowel gas pattern. IMPRESSION: No acute cardiopulmonary abnormality. Electronically Signed   By: Odessa Fleming M.D.   On: 02/08/2019 05:03     Labs:   Basic Metabolic Panel: Recent Labs  Lab 02/08/19 0428 02/09/19 0419  NA 132* 134*  K 3.5 3.5  CL 92* 99  CO2 20* 22  GLUCOSE 92 123*  BUN 5* <5*  CREATININE 0.89 0.98  CALCIUM 9.1 8.7*   GFR Estimated Creatinine Clearance: 104.8 mL/min (by C-G formula based on SCr of 0.98 mg/dL). Liver Function Tests: Recent Labs  Lab 02/08/19 0428  AST 61*  ALT 37  ALKPHOS 67  BILITOT 1.0  PROT 7.4  ALBUMIN 4.2   Recent Labs  Lab 02/08/19 0428  LIPASE 53*   No results for input(s): AMMONIA in the last 168 hours. Coagulation profile No results for input(s): INR, PROTIME in the last 168 hours.  CBC: Recent Labs  Lab 02/08/19 0428 02/09/19 0419  WBC 7.9 4.0  HGB 12.8* 12.4*  HCT 37.5* 36.4*  MCV 85.8 87.5  PLT 226 178   Cardiac Enzymes: Recent Labs  Lab 02/08/19 0428  TROPONINI <0.03   BNP: Invalid input(s): POCBNP CBG: No results for input(s): GLUCAP in the last 168 hours. D-Dimer No results for input(s): DDIMER in the last 72 hours. Hgb A1c No results for input(s): HGBA1C in the last 72 hours. Lipid Profile No results for input(s): CHOL, HDL, LDLCALC, TRIG, CHOLHDL, LDLDIRECT in the last 72 hours. Thyroid function studies No results for input(s): TSH, T4TOTAL, T3FREE, THYROIDAB in the last 72 hours.  Invalid input(s): FREET3 Anemia work up No results for input(s): VITAMINB12, FOLATE, FERRITIN, TIBC, IRON, RETICCTPCT in the last 72 hours. Microbiology No results found for this or any previous visit (from the past 240 hour(s)).   Discharge Instructions:   Discharge Instructions    Diet - low sodium heart healthy   Complete by:  As directed    Increase activity slowly    Complete by:  As directed      Allergies as of 02/10/2019      Reactions   Uncoded Nonscreenable Allergen Other (See Comments)   Sun tan lotion: swelling and peeling   Acetaminophen Other (See Comments)   Make he side hurt; he stated he has liver damage.    Ibuprofen Swelling   Swelling and discomfort of stomach   Nsaids Other (See Comments)   Swelling and discomfort of stomach   Penicillins Itching, Swelling, Rash   Has patient had a PCN reaction causing immediate rash, facial/tongue/throat swelling, SOB or lightheadedness with hypotension: yes Has patient had a PCN reaction causing severe rash involving mucus membranes or skin necrosis: no Has patient had a PCN reaction that required hospitalization yes, happened while hospitalized Has patient had a PCN reaction occurring within the last 10 years: yes If all of the above answers are "NO", then may proceed with Cephalosporin use.   [name] Other (See Comments)   DOESN'T EAT PORK-patient preference  Medication List    TAKE these medications   atorvastatin 40 MG tablet Commonly known as:  LIPITOR Take 1 tablet (40 mg total) by mouth at bedtime.   carvedilol 12.5 MG tablet Commonly known as:  COREG Take 1 tablet (12.5 mg total) by mouth 2 (two) times daily with a meal.   DULoxetine 30 MG capsule Commonly known as:  CYMBALTA Take 1 capsule (30 mg total) by mouth daily.   famotidine 20 MG tablet Commonly known as:  PEPCID Take 1 tablet (20 mg total) by mouth 2 (two) times daily.   losartan 50 MG tablet Commonly known as:  COZAAR Take 1 tablet (50 mg total) by mouth daily.   ondansetron 4 MG disintegrating tablet Commonly known as:  Zofran ODT Take 1 tablet (4 mg total) by mouth every 8 (eight) hours as needed for nausea or vomiting.   pantoprazole 40 MG tablet Commonly known as:  PROTONIX Take 1 tablet (40 mg total) by mouth 2 (two) times daily.   spironolactone 25 MG tablet Commonly known as:   ALDACTONE Take 1 tablet (25 mg total) by mouth daily.   sucralfate 1 GM/10ML suspension Commonly known as:  Carafate Take 10 mLs (1 g total) by mouth 4 (four) times daily -  with meals and at bedtime.      Follow-up Information    Lars Masson, MD Follow up.   Specialty:  Cardiology Why:  keep previously scheduled appointment Contact information: [address] [place] [address] [phone]            Time coordinating discharge: 35 min  Signed:  Joseph Art DO  [facility] 02/10/2019, 11:50 AM

## 2019-02-20 ENCOUNTER — Encounter (HOSPITAL_COMMUNITY): Payer: Self-pay | Admitting: Emergency Medicine

## 2019-02-20 ENCOUNTER — Inpatient Hospital Stay (HOSPITAL_COMMUNITY)
Admission: EM | Admit: 2019-02-20 | Discharge: 2019-02-23 | DRG: 897 | Disposition: A | Discharge status: Home / Self Care | Payer: Medicaid Other | Attending: Family Medicine | Admitting: Family Medicine

## 2019-02-20 ENCOUNTER — Other Ambulatory Visit: Payer: Self-pay

## 2019-02-20 ENCOUNTER — Emergency Department (HOSPITAL_COMMUNITY): Payer: Medicaid Other

## 2019-02-20 DIAGNOSIS — F10239 Alcohol dependence with withdrawal, unspecified: Secondary | ICD-10-CM | POA: Diagnosis present

## 2019-02-20 DIAGNOSIS — Z91018 Allergy to other foods: Secondary | ICD-10-CM

## 2019-02-20 DIAGNOSIS — I426 Alcoholic cardiomyopathy: Secondary | ICD-10-CM | POA: Diagnosis present

## 2019-02-20 DIAGNOSIS — F17210 Nicotine dependence, cigarettes, uncomplicated: Secondary | ICD-10-CM | POA: Diagnosis present

## 2019-02-20 DIAGNOSIS — I428 Other cardiomyopathies: Secondary | ICD-10-CM | POA: Diagnosis present

## 2019-02-20 DIAGNOSIS — F10231 Alcohol dependence with withdrawal delirium: Principal | ICD-10-CM | POA: Diagnosis present

## 2019-02-20 DIAGNOSIS — F418 Other specified anxiety disorders: Secondary | ICD-10-CM | POA: Diagnosis present

## 2019-02-20 DIAGNOSIS — I110 Hypertensive heart disease with heart failure: Secondary | ICD-10-CM | POA: Diagnosis present

## 2019-02-20 DIAGNOSIS — F10230 Alcohol dependence with withdrawal, uncomplicated: Secondary | ICD-10-CM

## 2019-02-20 DIAGNOSIS — Z886 Allergy status to analgesic agent status: Secondary | ICD-10-CM

## 2019-02-20 DIAGNOSIS — I5022 Chronic systolic (congestive) heart failure: Secondary | ICD-10-CM | POA: Diagnosis present

## 2019-02-20 DIAGNOSIS — F319 Bipolar disorder, unspecified: Secondary | ICD-10-CM | POA: Diagnosis present

## 2019-02-20 DIAGNOSIS — F10930 Alcohol use, unspecified with withdrawal, uncomplicated: Secondary | ICD-10-CM

## 2019-02-20 DIAGNOSIS — Z9119 Patient's noncompliance with other medical treatment and regimen: Secondary | ICD-10-CM

## 2019-02-20 DIAGNOSIS — Z8711 Personal history of peptic ulcer disease: Secondary | ICD-10-CM

## 2019-02-20 DIAGNOSIS — Z79899 Other long term (current) drug therapy: Secondary | ICD-10-CM

## 2019-02-20 DIAGNOSIS — Z880 Allergy status to penicillin: Secondary | ICD-10-CM

## 2019-02-20 DIAGNOSIS — E876 Hypokalemia: Secondary | ICD-10-CM

## 2019-02-20 DIAGNOSIS — K219 Gastro-esophageal reflux disease without esophagitis: Secondary | ICD-10-CM | POA: Diagnosis present

## 2019-02-20 DIAGNOSIS — F1020 Alcohol dependence, uncomplicated: Secondary | ICD-10-CM | POA: Diagnosis present

## 2019-02-20 DIAGNOSIS — I10 Essential (primary) hypertension: Secondary | ICD-10-CM | POA: Diagnosis present

## 2019-02-20 DIAGNOSIS — R072 Precordial pain: Secondary | ICD-10-CM | POA: Diagnosis present

## 2019-02-20 DIAGNOSIS — R0789 Other chest pain: Secondary | ICD-10-CM

## 2019-02-20 DIAGNOSIS — Y901 Blood alcohol level of 20-39 mg/100 ml: Secondary | ICD-10-CM | POA: Diagnosis present

## 2019-02-20 DIAGNOSIS — G4730 Sleep apnea, unspecified: Secondary | ICD-10-CM | POA: Diagnosis present

## 2019-02-20 DIAGNOSIS — Z888 Allergy status to other drugs, medicaments and biological substances status: Secondary | ICD-10-CM

## 2019-02-20 DIAGNOSIS — Z1159 Encounter for screening for other viral diseases: Secondary | ICD-10-CM

## 2019-02-20 DIAGNOSIS — Z800 Family history of malignant neoplasm of digestive organs: Secondary | ICD-10-CM

## 2019-02-20 DIAGNOSIS — F10939 Alcohol use, unspecified with withdrawal, unspecified: Secondary | ICD-10-CM | POA: Diagnosis present

## 2019-02-20 DIAGNOSIS — R066 Hiccough: Secondary | ICD-10-CM | POA: Diagnosis present

## 2019-02-20 LAB — COMPREHENSIVE METABOLIC PANEL
ALT: 28 U/L (ref 0–44)
AST: 49 U/L — ABNORMAL HIGH (ref 15–41)
Albumin: 4 g/dL (ref 3.5–5.0)
Alkaline Phosphatase: 56 U/L (ref 38–126)
Anion gap: 20 — ABNORMAL HIGH (ref 5–15)
BUN: 7 mg/dL (ref 6–20)
CO2: 17 mmol/L — ABNORMAL LOW (ref 22–32)
Calcium: 9.9 mg/dL (ref 8.9–10.3)
Chloride: 100 mmol/L (ref 98–111)
Creatinine, Ser: 0.85 mg/dL (ref 0.61–1.24)
GFR calc Af Amer: >60 mL/min (ref >60)
GFR calc non Af Amer: >60 mL/min (ref >60)
Glucose, Bld: 81 mg/dL (ref 70–99)
Potassium: 3.6 mmol/L (ref 3.5–5.1)
Sodium: 137 mmol/L (ref 135–145)
Total Bilirubin: 0.8 mg/dL (ref 0.3–1.2)
Total Protein: 7.4 g/dL (ref 6.5–8.1)

## 2019-02-20 LAB — CBC WITH DIFFERENTIAL/PLATELET
Abs Immature Granulocytes: 0.01 10*3/uL (ref 0.00–0.07)
Basophils Absolute: 0 10*3/uL (ref 0.0–0.1)
Basophils Relative: 1 %
Eosinophils Absolute: 0.1 10*3/uL (ref 0.0–0.5)
Eosinophils Relative: 3 %
HCT: 38.5 % — ABNORMAL LOW (ref 39.0–52.0)
Hemoglobin: 13.3 g/dL (ref 13.0–17.0)
Immature Granulocytes: 0 %
Lymphocytes Relative: 30 %
Lymphs Abs: 0.9 10*3/uL (ref 0.7–4.0)
MCH: 30.4 pg (ref 26.0–34.0)
MCHC: 34.5 g/dL (ref 30.0–36.0)
MCV: 87.9 fL (ref 80.0–100.0)
Monocytes Absolute: 0.4 10*3/uL (ref 0.1–1.0)
Monocytes Relative: 13 %
Neutro Abs: 1.7 10*3/uL (ref 1.7–7.7)
Neutrophils Relative %: 53 %
Platelets: 265 10*3/uL (ref 150–400)
RBC: 4.38 MIL/uL (ref 4.22–5.81)
RDW: 16.3 % — ABNORMAL HIGH (ref 11.5–15.5)
WBC: 3.1 10*3/uL — ABNORMAL LOW (ref 4.0–10.5)
nRBC: 0 % (ref 0.0–0.2)

## 2019-02-20 LAB — RAPID URINE DRUG SCREEN, HOSP PERFORMED
Amphetamines: NOT DETECTED
Barbiturates: NOT DETECTED
Benzodiazepines: NOT DETECTED
Cocaine: NOT DETECTED
Opiates: NOT DETECTED
Tetrahydrocannabinol: NOT DETECTED

## 2019-02-20 LAB — TROPONIN I: Troponin I: <0.03 ng/mL (ref <0.03)

## 2019-02-20 LAB — LIPASE, BLOOD: Lipase: 63 U/L — ABNORMAL HIGH (ref 11–51)

## 2019-02-20 LAB — BRAIN NATRIURETIC PEPTIDE: B Natriuretic Peptide: 18.9 pg/mL (ref 0.0–100.0)

## 2019-02-20 LAB — ETHANOL: Alcohol, Ethyl (B): 27 mg/dL — ABNORMAL HIGH (ref <10)

## 2019-02-20 LAB — SARS CORONAVIRUS 2 BY RT PCR (HOSPITAL ORDER, PERFORMED IN ~~LOC~~ HOSPITAL LAB): SARS Coronavirus 2: NEGATIVE

## 2019-02-20 MED ORDER — MORPHINE SULFATE (PF) 4 MG/ML IV SOLN
4.0000 mg | Freq: Once | INTRAVENOUS | Status: AC
  Administered 2019-02-20: 4 mg via INTRAVENOUS
  Filled 2019-02-20: qty 1

## 2019-02-20 MED ORDER — LORAZEPAM 1 MG PO TABS
1.0000 mg | ORAL_TABLET | Freq: Four times a day (QID) | ORAL | Status: DC | PRN
  Administered 2019-02-21 – 2019-02-22 (×2): 1 mg via ORAL
  Filled 2019-02-20 (×2): qty 1

## 2019-02-20 MED ORDER — CARVEDILOL 12.5 MG PO TABS
12.5000 mg | ORAL_TABLET | Freq: Two times a day (BID) | ORAL | Status: DC
  Administered 2019-02-21 – 2019-02-23 (×5): 12.5 mg via ORAL
  Filled 2019-02-20 (×5): qty 1

## 2019-02-20 MED ORDER — FOLIC ACID 1 MG PO TABS
1.0000 mg | ORAL_TABLET | Freq: Every day | ORAL | Status: DC
  Administered 2019-02-21 – 2019-02-23 (×3): 1 mg via ORAL
  Filled 2019-02-20 (×3): qty 1

## 2019-02-20 MED ORDER — LORAZEPAM 2 MG/ML IJ SOLN
1.0000 mg | Freq: Once | INTRAMUSCULAR | Status: AC
  Administered 2019-02-20: 1 mg via INTRAVENOUS
  Filled 2019-02-20: qty 1

## 2019-02-20 MED ORDER — LORAZEPAM 2 MG/ML IJ SOLN: 1.0000 mg | Freq: Four times a day (QID) | INTRAMUSCULAR | Status: DC | PRN

## 2019-02-20 MED ORDER — SUCRALFATE 1 GM/10ML PO SUSP
1.0000 g | Freq: Three times a day (TID) | ORAL | Status: DC
  Administered 2019-02-20 – 2019-02-23 (×10): 1 g via ORAL
  Filled 2019-02-20 (×11): qty 10

## 2019-02-20 MED ORDER — MORPHINE SULFATE (PF) 4 MG/ML IV SOLN: 4.0000 mg | Freq: Once | INTRAVENOUS | Status: DC

## 2019-02-20 MED ORDER — ATORVASTATIN CALCIUM 40 MG PO TABS
40.0000 mg | ORAL_TABLET | Freq: Every day | ORAL | Status: DC
  Administered 2019-02-20 – 2019-02-22 (×3): 40 mg via ORAL
  Filled 2019-02-20 (×3): qty 1

## 2019-02-20 MED ORDER — ONDANSETRON HCL 4 MG PO TABS: 4.0000 mg | ORAL_TABLET | Freq: Four times a day (QID) | ORAL | Status: DC | PRN

## 2019-02-20 MED ORDER — LORAZEPAM 2 MG/ML IJ SOLN: 0.0000 mg | Freq: Two times a day (BID) | INTRAMUSCULAR | Status: DC

## 2019-02-20 MED ORDER — LORAZEPAM 2 MG/ML IJ SOLN: 0.0000 mg | Freq: Four times a day (QID) | INTRAMUSCULAR | Status: DC

## 2019-02-20 MED ORDER — VITAMIN B-1 100 MG PO TABS
100.0000 mg | ORAL_TABLET | Freq: Every day | ORAL | Status: DC
  Administered 2019-02-20 – 2019-02-23 (×4): 100 mg via ORAL
  Filled 2019-02-20 (×4): qty 1

## 2019-02-20 MED ORDER — DULOXETINE HCL 30 MG PO CPEP
30.0000 mg | ORAL_CAPSULE | Freq: Every day | ORAL | Status: DC
  Administered 2019-02-21 – 2019-02-23 (×3): 30 mg via ORAL
  Filled 2019-02-20 (×3): qty 1

## 2019-02-20 MED ORDER — ACETAMINOPHEN 325 MG PO TABS
650.0000 mg | ORAL_TABLET | Freq: Four times a day (QID) | ORAL | Status: DC | PRN
  Administered 2019-02-22 (×2): 650 mg via ORAL
  Filled 2019-02-20 (×2): qty 2

## 2019-02-20 MED ORDER — PANTOPRAZOLE SODIUM 40 MG PO TBEC
40.0000 mg | DELAYED_RELEASE_TABLET | Freq: Two times a day (BID) | ORAL | Status: DC
  Administered 2019-02-20 – 2019-02-23 (×6): 40 mg via ORAL
  Filled 2019-02-20 (×6): qty 1

## 2019-02-20 MED ORDER — ONDANSETRON HCL 4 MG/2ML IJ SOLN: 4.0000 mg | Freq: Four times a day (QID) | INTRAMUSCULAR | Status: DC | PRN

## 2019-02-20 MED ORDER — ACETAMINOPHEN 650 MG RE SUPP: 650.0000 mg | Freq: Four times a day (QID) | RECTAL | Status: DC | PRN

## 2019-02-20 MED ORDER — ONDANSETRON 4 MG PO TBDP: 4.0000 mg | ORAL_TABLET | Freq: Three times a day (TID) | ORAL | Status: DC | PRN

## 2019-02-20 MED ORDER — LORAZEPAM 2 MG/ML IJ SOLN: 1.0000 mg | Freq: Once | INTRAMUSCULAR | Status: DC

## 2019-02-20 MED ORDER — THIAMINE HCL 100 MG/ML IJ SOLN: 100.0000 mg | Freq: Every day | INTRAMUSCULAR | Status: DC

## 2019-02-20 MED ORDER — LORAZEPAM 1 MG PO TABS
0.0000 mg | ORAL_TABLET | Freq: Four times a day (QID) | ORAL | Status: DC
  Administered 2019-02-20: 2 mg via ORAL
  Administered 2019-02-22: 1 mg via ORAL
  Filled 2019-02-20: qty 1
  Filled 2019-02-20: qty 2
  Filled 2019-02-20: qty 1

## 2019-02-20 MED ORDER — LOSARTAN POTASSIUM 50 MG PO TABS
50.0000 mg | ORAL_TABLET | Freq: Every day | ORAL | Status: DC
  Administered 2019-02-21 – 2019-02-23 (×3): 50 mg via ORAL
  Filled 2019-02-20 (×3): qty 1

## 2019-02-20 MED ORDER — LORAZEPAM 2 MG/ML IJ SOLN
2.0000 mg | Freq: Once | INTRAMUSCULAR | Status: AC
  Administered 2019-02-20: 2 mg via INTRAVENOUS
  Filled 2019-02-20: qty 1

## 2019-02-20 MED ORDER — FAMOTIDINE 20 MG PO TABS
20.0000 mg | ORAL_TABLET | Freq: Two times a day (BID) | ORAL | Status: DC
  Administered 2019-02-20 – 2019-02-23 (×6): 20 mg via ORAL
  Filled 2019-02-20 (×6): qty 1

## 2019-02-20 MED ORDER — SODIUM CHLORIDE 0.9 % IV BOLUS: 500.0000 mL | Freq: Once | INTRAVENOUS | Status: DC

## 2019-02-20 MED ORDER — ADULT MULTIVITAMIN W/MINERALS CH
1.0000 | ORAL_TABLET | Freq: Every day | ORAL | Status: DC
  Administered 2019-02-21 – 2019-02-23 (×3): 1 via ORAL
  Filled 2019-02-20 (×2): qty 1

## 2019-02-20 MED ORDER — LORAZEPAM 1 MG PO TABS: 0.0000 mg | ORAL_TABLET | Freq: Two times a day (BID) | ORAL | Status: DC
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
## 2019-02-20 NOTE — ED Notes (Signed)
Patient transported to X-ray 

## 2019-02-20 NOTE — ED Triage Notes (Addendum)
Pt to ED with c/o mid chest (burning) type pain since last night.  EMS gave NTG without relief.  Pt also c/o shortness of breath.  Pt also c/o nausea, vomiting and diarrhea
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
## 2019-02-20 NOTE — Progress Notes (Addendum)
Received pt to 5W14, tele placed verified x2. Skin assesment done w/ 2 RNs. VSS. Pt c/o 6/10 epigastric/midsternal chest pain. PRN given. Ativan given per CIWA protocol. Pt tachy up to 150s w/ movement, 120s when resting. Pt anxious but cooperative. Educated on call light and plan of care. Will continue to monitor.

## 2019-02-20 NOTE — ED Notes (Signed)
Pt is back from XR

## 2019-02-21 DIAGNOSIS — F10931 Alcohol use, unspecified with withdrawal delirium: Secondary | ICD-10-CM | POA: Insufficient documentation

## 2019-02-21 DIAGNOSIS — G4730 Sleep apnea, unspecified: Secondary | ICD-10-CM | POA: Diagnosis present

## 2019-02-21 DIAGNOSIS — Y901 Blood alcohol level of 20-39 mg/100 ml: Secondary | ICD-10-CM | POA: Diagnosis present

## 2019-02-21 DIAGNOSIS — Z800 Family history of malignant neoplasm of digestive organs: Secondary | ICD-10-CM | POA: Diagnosis not present

## 2019-02-21 DIAGNOSIS — I5022 Chronic systolic (congestive) heart failure: Secondary | ICD-10-CM | POA: Diagnosis present

## 2019-02-21 DIAGNOSIS — E876 Hypokalemia: Secondary | ICD-10-CM | POA: Diagnosis not present

## 2019-02-21 DIAGNOSIS — F418 Other specified anxiety disorders: Secondary | ICD-10-CM | POA: Diagnosis present

## 2019-02-21 DIAGNOSIS — Z79899 Other long term (current) drug therapy: Secondary | ICD-10-CM | POA: Diagnosis not present

## 2019-02-21 DIAGNOSIS — I110 Hypertensive heart disease with heart failure: Secondary | ICD-10-CM | POA: Diagnosis present

## 2019-02-21 DIAGNOSIS — I426 Alcoholic cardiomyopathy: Secondary | ICD-10-CM | POA: Diagnosis present

## 2019-02-21 DIAGNOSIS — Z9119 Patient's noncompliance with other medical treatment and regimen: Secondary | ICD-10-CM | POA: Diagnosis not present

## 2019-02-21 DIAGNOSIS — Z8711 Personal history of peptic ulcer disease: Secondary | ICD-10-CM | POA: Diagnosis not present

## 2019-02-21 DIAGNOSIS — R066 Hiccough: Secondary | ICD-10-CM | POA: Diagnosis present

## 2019-02-21 DIAGNOSIS — Z888 Allergy status to other drugs, medicaments and biological substances status: Secondary | ICD-10-CM | POA: Diagnosis not present

## 2019-02-21 DIAGNOSIS — F10231 Alcohol dependence with withdrawal delirium: Secondary | ICD-10-CM | POA: Insufficient documentation

## 2019-02-21 DIAGNOSIS — R072 Precordial pain: Secondary | ICD-10-CM | POA: Diagnosis present

## 2019-02-21 DIAGNOSIS — Z1159 Encounter for screening for other viral diseases: Secondary | ICD-10-CM | POA: Diagnosis not present

## 2019-02-21 DIAGNOSIS — Z880 Allergy status to penicillin: Secondary | ICD-10-CM | POA: Diagnosis not present

## 2019-02-21 DIAGNOSIS — I428 Other cardiomyopathies: Secondary | ICD-10-CM | POA: Diagnosis present

## 2019-02-21 DIAGNOSIS — F17210 Nicotine dependence, cigarettes, uncomplicated: Secondary | ICD-10-CM | POA: Diagnosis present

## 2019-02-21 DIAGNOSIS — Z91018 Allergy to other foods: Secondary | ICD-10-CM | POA: Diagnosis not present

## 2019-02-21 DIAGNOSIS — Z886 Allergy status to analgesic agent status: Secondary | ICD-10-CM | POA: Diagnosis not present

## 2019-02-21 DIAGNOSIS — F10239 Alcohol dependence with withdrawal, unspecified: Secondary | ICD-10-CM | POA: Diagnosis not present

## 2019-02-21 DIAGNOSIS — F319 Bipolar disorder, unspecified: Secondary | ICD-10-CM | POA: Diagnosis present

## 2019-02-21 DIAGNOSIS — K219 Gastro-esophageal reflux disease without esophagitis: Secondary | ICD-10-CM | POA: Diagnosis present

## 2019-02-21 LAB — URINALYSIS, ROUTINE W REFLEX MICROSCOPIC
Bilirubin Urine: NEGATIVE
Glucose, UA: NEGATIVE mg/dL
Ketones, ur: 20 mg/dL — AB
Leukocytes,Ua: NEGATIVE
Nitrite: NEGATIVE
Protein, ur: 100 mg/dL — AB
Specific Gravity, Urine: 1.024 (ref 1.005–1.030)
pH: 5 (ref 5.0–8.0)

## 2019-02-21 LAB — CBC
HCT: 38.2 % — ABNORMAL LOW (ref 39.0–52.0)
Hemoglobin: 13.1 g/dL (ref 13.0–17.0)
MCH: 30 pg (ref 26.0–34.0)
MCHC: 34.3 g/dL (ref 30.0–36.0)
MCV: 87.6 fL (ref 80.0–100.0)
Platelets: 254 10*3/uL (ref 150–400)
RBC: 4.36 MIL/uL (ref 4.22–5.81)
RDW: 16.3 % — ABNORMAL HIGH (ref 11.5–15.5)
WBC: 4.6 10*3/uL (ref 4.0–10.5)
nRBC: 0 % (ref 0.0–0.2)

## 2019-02-21 LAB — BASIC METABOLIC PANEL
Anion gap: 12 (ref 5–15)
BUN: 6 mg/dL (ref 6–20)
CO2: 20 mmol/L — ABNORMAL LOW (ref 22–32)
Calcium: 8.9 mg/dL (ref 8.9–10.3)
Chloride: 102 mmol/L (ref 98–111)
Creatinine, Ser: 0.98 mg/dL (ref 0.61–1.24)
GFR calc Af Amer: >60 mL/min (ref >60)
GFR calc non Af Amer: >60 mL/min (ref >60)
Glucose, Bld: 118 mg/dL — ABNORMAL HIGH (ref 70–99)
Potassium: 3.4 mmol/L — ABNORMAL LOW (ref 3.5–5.1)
Sodium: 134 mmol/L — ABNORMAL LOW (ref 135–145)

## 2019-02-21 MED ORDER — SPIRONOLACTONE 25 MG PO TABS
25.0000 mg | ORAL_TABLET | Freq: Every day | ORAL | Status: DC
  Administered 2019-02-21 – 2019-02-23 (×3): 25 mg via ORAL
  Filled 2019-02-21 (×3): qty 1

## 2019-02-21 MED ORDER — POTASSIUM CHLORIDE CRYS ER 20 MEQ PO TBCR
20.0000 meq | EXTENDED_RELEASE_TABLET | Freq: Two times a day (BID) | ORAL | Status: AC
  Administered 2019-02-21 – 2019-02-22 (×3): 20 meq via ORAL
  Filled 2019-02-21 (×2): qty 1

## 2019-02-21 MED ORDER — FLUOXETINE HCL 20 MG PO CAPS: 20.0000 mg | ORAL_CAPSULE | Freq: Every day | ORAL | Status: DC

## 2019-02-21 MED ORDER — LEVOTHYROXINE SODIUM 88 MCG PO TABS
88.0000 ug | ORAL_TABLET | Freq: Every day | ORAL | Status: DC
  Administered 2019-02-22 – 2019-02-23 (×2): 88 ug via ORAL
  Filled 2019-02-21 (×2): qty 1

## 2019-02-21 MED ORDER — PROCHLORPERAZINE EDISYLATE 10 MG/2ML IJ SOLN
10.0000 mg | INTRAMUSCULAR | Status: DC | PRN
  Administered 2019-02-21 – 2019-02-23 (×2): 10 mg via INTRAVENOUS
  Filled 2019-02-21 (×2): qty 2

## 2019-02-21 MED ORDER — MIRTAZAPINE 15 MG PO TABS
15.0000 mg | ORAL_TABLET | Freq: Every day | ORAL | Status: DC
  Administered 2019-02-21 – 2019-02-22 (×2): 15 mg via ORAL
  Filled 2019-02-21 (×2): qty 1

## 2019-02-21 NOTE — Progress Notes (Signed)
[facility] PROGRESS NOTE  KOJI GRIEF ZOX:[id number] DOB: [birth year] DOA: 02/20/2019 PCP: System, Pcp Not In  Assessment/Plan:  1. EtOH withdrawal - ETOH level 27 on presentation. Frequent admissions for same. No events on tel.   Discharged just 10 days ago. Plan was to follow up OP rehab and he did not.  Reports 2 days since he had "one beer". CIWA score 3 this am down from 20. Remains somewhat tremulous and tachycardic. Complains nausea 1.  continue CIWA 2. Zofran PRN nausea 3. Tele monitor 4. SW consult  2. HTN - high end of normal. Home meds include coreg and cozaar 1. Continue home BP meds 3. Chronic systolic CHF - ef 35% per chart. Home meds include coreg and aldactone. Was provided with gentle IV fluids. Continue to hold aldactone, will monitor intake and output. Obtain daily weights  4. Atypical CP - likely related to frequent vomiting and hiccups. Pain reproducible on exam. EKG without acute changes. Troponin neg. Neg heart cath in March. Provide supportive therapy  Code Status: full Family Communication: none present Disposition Plan: home hopefully in am   Consultants:  none  Procedures:    Antibiotics:    HPI/Subjective: Admitted frequently with ETOH withdrawal symptoms. Hx bipolar, chf, htn.   Reports feeling "better" this am. Remains nauseated but no emesis since last evening. Complains hiccups  Objective: Vitals:   02/20/19 2200 02/21/19 0450  BP: (!) 169/85 (!) 135/97  Pulse: (!) 124 (!) 107  Resp: 16   Temp: 100 F (37.8 C) 98.6 F (37 C)  SpO2: 98% 99%    Intake/Output Summary (Last 24 hours) at 02/21/2019 1110 Last data filed at 02/21/2019 0842 Gross per 24 hour  Intake 120 ml  Output -  Net 120 ml   Filed Weights   02/20/19 1623 02/20/19 2200  Weight: 103 kg 98.9 kg    Exam:   General:  Awake alert no acute distress  Cardiovascular: tachycardia but regular no MGR no LE edema chest tender to palpation  Respiratory: normal  effort BS clear bilaterally no wheeze  Abdomen: obese soft +BS no guarding or rebounding  Musculoskeletal: joints without swelling/erythema   Data Reviewed: Basic Metabolic Panel: Recent Labs  Lab 02/20/19 1722 02/21/19 0336  NA 137 134*  K 3.6 3.4*  CL 100 102  CO2 17* 20*  GLUCOSE 81 118*  BUN 7 6  CREATININE 0.85 0.98  CALCIUM 9.9 8.9   Liver Function Tests: Recent Labs  Lab 02/20/19 1722  AST 49*  ALT 28  ALKPHOS 56  BILITOT 0.8  PROT 7.4  ALBUMIN 4.0   Recent Labs  Lab 02/20/19 1722  LIPASE 63*   No results for input(s): AMMONIA in the last 168 hours. CBC: Recent Labs  Lab 02/20/19 1722 02/21/19 0336  WBC 3.1* 4.6  NEUTROABS 1.7  --   HGB 13.3 13.1  HCT 38.5* 38.2*  MCV 87.9 87.6  PLT 265 254   Cardiac Enzymes: Recent Labs  Lab 02/20/19 1722  TROPONINI <0.03   BNP (last 3 results) Recent Labs    01/07/19 2000 02/08/19 0428 02/20/19 1722  BNP 16.8 20.6 18.9    ProBNP (last 3 results) No results for input(s): PROBNP in the last 8760 hours.  CBG: No results for input(s): GLUCAP in the last 168 hours.  Recent Results (from the past 240 hour(s))  SARS Coronavirus 2 (CEPHEID - Performed in [facility] hospital lab), Hosp Order     Status: None   Collection Time:  02/20/19  7:42 PM  Result Value Ref Range Status   SARS Coronavirus 2 NEGATIVE NEGATIVE Final    Comment: (NOTE) If result is NEGATIVE SARS-CoV-2 target nucleic acids are NOT DETECTED. The SARS-CoV-2 RNA is generally detectable in upper and lower  respiratory specimens during the acute phase of infection. The lowest  concentration of SARS-CoV-2 viral copies this assay can detect is 250  copies / mL. A negative result does not preclude SARS-CoV-2 infection  and should not be used as the sole basis for treatment or other  patient management decisions.  A negative result may occur with  improper specimen collection / handling, submission of specimen other  than nasopharyngeal  swab, presence of viral mutation(s) within the  areas targeted by this assay, and inadequate number of viral copies  (<250 copies / mL). A negative result must be combined with clinical  observations, patient history, and epidemiological information. If result is POSITIVE SARS-CoV-2 target nucleic acids are DETECTED. The SARS-CoV-2 RNA is generally detectable in upper and lower  respiratory specimens dur ing the acute phase of infection.  Positive  results are indicative of active infection with SARS-CoV-2.  Clinical  correlation with patient history and other diagnostic information is  necessary to determine patient infection status.  Positive results do  not rule out bacterial infection or co-infection with other viruses. If result is PRESUMPTIVE POSTIVE SARS-CoV-2 nucleic acids MAY BE PRESENT.   A presumptive positive result was obtained on the submitted specimen  and confirmed on repeat testing.  While 2019 novel coronavirus  (SARS-CoV-2) nucleic acids may be present in the submitted sample  additional confirmatory testing may be necessary for epidemiological  and / or clinical management purposes  to differentiate between  SARS-CoV-2 and other Sarbecovirus currently known to infect humans.  If clinically indicated additional testing with an alternate test  methodology [phone]) is advised. The SARS-CoV-2 RNA is generally  detectable in upper and lower respiratory sp ecimens during the acute  phase of infection. The expected result is Negative. Fact Sheet for Patients:  BoilerBrush.com.cy Fact Sheet for Healthcare Providers: https://pope.com/ This test is not yet approved or cleared by the [place] FDA and has been authorized for detection and/or diagnosis of SARS-CoV-2 by FDA under an Emergency Use Authorization (EUA).  This EUA will remain in effect (meaning this test can be used) for the duration of the COVID-19 declaration  under Section 564(b)(1) of the Act, 21 U.S.C. section 360bbb-3(b)(1), unless the authorization is terminated or revoked sooner. Performed at [facility], [address], [place], [place] [address]      Studies: Dg Chest 2 View  Result Date: 02/20/2019 CLINICAL DATA:  Mid chest pain. EXAM: CHEST - 2 VIEW COMPARISON:  February 08, 2019 FINDINGS: The heart size and mediastinal contours are within normal limits. Both lungs are clear. The visualized skeletal structures are unremarkable. IMPRESSION: No active cardiopulmonary disease. Electronically Signed   By: Gerome Sam III M.D   On: 02/20/2019 17:11    Scheduled Meds: . atorvastatin  40 mg Oral QHS  . carvedilol  12.5 mg Oral BID WC  . DULoxetine  30 mg Oral Daily  . famotidine  20 mg Oral BID  . folic acid  1 mg Oral Daily  . LORazepam  0-4 mg Intravenous Q6H   Or  . LORazepam  0-4 mg Oral Q6H  . [START ON 02/22/2019] LORazepam  0-4 mg Intravenous Q12H   Or  . [START ON 02/22/2019] LORazepam  0-4 mg Oral Q12H  . losartan  50 mg Oral Daily  . multivitamin with minerals  1 tablet Oral Daily  . pantoprazole  40 mg Oral BID  . sucralfate  1 g Oral TID WC & HS  . thiamine  100 mg Oral Daily   Or  . thiamine  100 mg Intravenous Daily   Continuous Infusions: . sodium chloride      Principal Problem:   Alcohol withdrawal (HCC) Active Problems:   Alcohol use disorder, severe, dependence (HCC)   HTN (hypertension)   Chronic systolic CHF (congestive heart failure) (HCC)   Hiccups    Time spent: 45 minutes    [facility] M NP  [facility]  If 7PM-7AM, please contact night-coverage at [email or web address], password [facility] 02/21/2019, 11:10 AM  LOS: 0 days

## 2019-02-22 DIAGNOSIS — F10231 Alcohol dependence with withdrawal delirium: Principal | ICD-10-CM

## 2019-02-22 DIAGNOSIS — E876 Hypokalemia: Secondary | ICD-10-CM

## 2019-02-22 LAB — BASIC METABOLIC PANEL
Anion gap: 13 (ref 5–15)
BUN: 6 mg/dL (ref 6–20)
CO2: 21 mmol/L — ABNORMAL LOW (ref 22–32)
Calcium: 8.7 mg/dL — ABNORMAL LOW (ref 8.9–10.3)
Chloride: 102 mmol/L (ref 98–111)
Creatinine, Ser: 0.96 mg/dL (ref 0.61–1.24)
GFR calc Af Amer: >60 mL/min (ref >60)
GFR calc non Af Amer: >60 mL/min (ref >60)
Glucose, Bld: 118 mg/dL — ABNORMAL HIGH (ref 70–99)
Potassium: 3.4 mmol/L — ABNORMAL LOW (ref 3.5–5.1)
Sodium: 136 mmol/L (ref 135–145)

## 2019-02-22 MED ORDER — AMLODIPINE BESYLATE 10 MG PO TABS
10.0000 mg | ORAL_TABLET | Freq: Every day | ORAL | Status: DC
  Administered 2019-02-22: 10 mg via ORAL
  Filled 2019-02-22: qty 1

## 2019-02-22 MED ORDER — BENZOCAINE 10 % MT GEL
Freq: Four times a day (QID) | OROMUCOSAL | Status: DC | PRN
  Administered 2019-02-22: 1 via OROMUCOSAL
  Filled 2019-02-22: qty 9

## 2019-02-22 MED ORDER — OXYCODONE HCL 5 MG PO TABS
5.0000 mg | ORAL_TABLET | Freq: Four times a day (QID) | ORAL | Status: DC | PRN
  Administered 2019-02-22 – 2019-02-23 (×3): 5 mg via ORAL
  Filled 2019-02-22 (×3): qty 1

## 2019-02-22 MED ORDER — POTASSIUM CHLORIDE CRYS ER 20 MEQ PO TBCR
20.0000 meq | EXTENDED_RELEASE_TABLET | Freq: Two times a day (BID) | ORAL | Status: DC
  Administered 2019-02-22 – 2019-02-23 (×3): 20 meq via ORAL
  Filled 2019-02-22 (×3): qty 1

## 2019-02-22 MED ORDER — LORAZEPAM 2 MG/ML IJ SOLN: 2.0000 mg | INTRAMUSCULAR | Status: DC | PRN

## 2019-02-22 NOTE — Progress Notes (Signed)
[facility] PROGRESS NOTE  IVA BANDUCCI NWG:[id number] DOB: Oct 31, 1965 DOA: 02/20/2019 PCP: System, Pcp Not In  Assessment/Plan: 1. EtOH withdrawal - ETOH level 27 on presentation. Frequent admissions for same. No events on tel.  Remains somewhat tremulous and tachycardic. CIWA score 10 Complains nausea 1.  continue CIWA 2. Zofran PRN nausea 3. Tele monitor 4. SW consult  2. HTN - high end of normal. Home meds include coreg and cozaar and norvasc. Previously on only coreg and cozaar.  1. Will add norvasc to home coreg and cozaar 2. Monitor closely 3. Chronic systolic CHF - ef 35% per chart. Home meds include coreg and aldactone. Was provided with gentle IV fluids. Weight stable Continue to hold aldactone, will monitor intake and output. Obtain daily weights  4. Atypical CP - no further chest pain.  likely related to frequent vomiting and hiccups. Pain was reproducible on exam. EKG without acute changes. Troponin neg. Neg heart cath in March. Provide supportive therapy 5. Hypokalemia. Mild. Will replete and recheck    Code Status: full Family Communication: plan explained to patient Disposition Plan: home hopefully in am   Consultants:  none  Procedures:    Antibiotics:    HPI/Subjective: 53 yo admitted 5/3 with ETOH withdrawal symptoms. Admitted for CIWA score 20 on presentation. Remains tremulous and tachycardic.   Objective: Vitals:   02/22/19 0519 02/22/19 0936  BP: (!) 122/92 127/89  Pulse: 91 (!) 105  Resp: 18   Temp: 98.7 F (37.1 C) 99.8 F (37.7 C)  SpO2: 97% 96%    Intake/Output Summary (Last 24 hours) at 02/22/2019 1250 Last data filed at 02/22/2019 0800 Gross per 24 hour  Intake 580 ml  Output -  Net 580 ml   Filed Weights   02/20/19 1623 02/20/19 2200 02/22/19 0519  Weight: 103 kg 98.9 kg 99.7 kg    Exam:   General:  Awake alert some what anxious   Cardiovascular: tachycardia but regular. No mgr no LE edema  Respiratory: normal  effort BS clear bilaterally no wheeze  Abdomen: obese soft +BS non-tender to palpation, no guarding or rebounding  Musculoskeletal: joints without swelling/erythema MAE   Data Reviewed: Basic Metabolic Panel: Recent Labs  Lab 02/20/19 1722 02/21/19 0336 02/22/19 0309  NA 137 134* 136  K 3.6 3.4* 3.4*  CL 100 102 102  CO2 17* 20* 21*  GLUCOSE 81 118* 118*  BUN 7 6 6   CREATININE 0.85 0.98 0.96  CALCIUM 9.9 8.9 8.7*   Liver Function Tests: Recent Labs  Lab 02/20/19 1722  AST 49*  ALT 28  ALKPHOS 56  BILITOT 0.8  PROT 7.4  ALBUMIN 4.0   Recent Labs  Lab 02/20/19 1722  LIPASE 63*   No results for input(s): AMMONIA in the last 168 hours. CBC: Recent Labs  Lab 02/20/19 1722 02/21/19 0336  WBC 3.1* 4.6  NEUTROABS 1.7  --   HGB 13.3 13.1  HCT 38.5* 38.2*  MCV 87.9 87.6  PLT 265 254   Cardiac Enzymes: Recent Labs  Lab 02/20/19 1722  TROPONINI <0.03   BNP (last 3 results) Recent Labs    01/07/19 2000 02/08/19 0428 02/20/19 1722  BNP 16.8 20.6 18.9    ProBNP (last 3 results) No results for input(s): PROBNP in the last 8760 hours.  CBG: No results for input(s): GLUCAP in the last 168 hours.  Recent Results (from the past 240 hour(s))  SARS Coronavirus 2 (CEPHEID - Performed in [facility] hospital lab), [facility]  Status: None   Collection Time: 02/20/19  7:42 PM  Result Value Ref Range Status   SARS Coronavirus 2 NEGATIVE NEGATIVE Final    Comment: (NOTE) If result is NEGATIVE SARS-CoV-2 target nucleic acids are NOT DETECTED. The SARS-CoV-2 RNA is generally detectable in upper and lower  respiratory specimens during the acute phase of infection. The lowest  concentration of SARS-CoV-2 viral copies this assay can detect is 250  copies / mL. A negative result does not preclude SARS-CoV-2 infection  and should not be used as the sole basis for treatment or other  patient management decisions.  A negative result may occur with  improper  specimen collection / handling, submission of specimen other  than nasopharyngeal swab, presence of viral mutation(s) within the  areas targeted by this assay, and inadequate number of viral copies  (<250 copies / mL). A negative result must be combined with clinical  observations, patient history, and epidemiological information. If result is POSITIVE SARS-CoV-2 target nucleic acids are DETECTED. The SARS-CoV-2 RNA is generally detectable in upper and lower  respiratory specimens dur ing the acute phase of infection.  Positive  results are indicative of active infection with SARS-CoV-2.  Clinical  correlation with patient history and other diagnostic information is  necessary to determine patient infection status.  Positive results do  not rule out bacterial infection or co-infection with other viruses. If result is PRESUMPTIVE POSTIVE SARS-CoV-2 nucleic acids MAY BE PRESENT.   A presumptive positive result was obtained on the submitted specimen  and confirmed on repeat testing.  While 2019 novel coronavirus  (SARS-CoV-2) nucleic acids may be present in the submitted sample  additional confirmatory testing may be necessary for epidemiological  and / or clinical management purposes  to differentiate between  SARS-CoV-2 and other Sarbecovirus currently known to infect humans.  If clinically indicated additional testing with an alternate test  methodology [phone]) is advised. The SARS-CoV-2 RNA is generally  detectable in upper and lower respiratory sp ecimens during the acute  phase of infection. The expected result is Negative. Fact Sheet for Patients:  BoilerBrush.com.cy Fact Sheet for Healthcare Providers: https://pope.com/ This test is not yet approved or cleared by the [place] FDA and has been authorized for detection and/or diagnosis of SARS-CoV-2 by FDA under an Emergency Use Authorization (EUA).  This EUA will remain in  effect (meaning this test can be used) for the duration of the COVID-19 declaration under Section 564(b)(1) of the Act, 21 U.S.C. section 360bbb-3(b)(1), unless the authorization is terminated or revoked sooner. Performed at [facility], [address] [place], [place] [address]      Studies: Dg Chest 2 View  Result Date: 02/20/2019 CLINICAL DATA:  Mid chest pain. EXAM: CHEST - 2 VIEW COMPARISON:  February 08, 2019 FINDINGS: The heart size and mediastinal contours are within normal limits. Both lungs are clear. The visualized skeletal structures are unremarkable. IMPRESSION: No active cardiopulmonary disease. Electronically Signed   By: Gerome Sam III M.D   On: 02/20/2019 17:11    Scheduled Meds: . amLODipine  10 mg Oral Daily  . atorvastatin  40 mg Oral QHS  . carvedilol  12.5 mg Oral BID WC  . DULoxetine  30 mg Oral Daily  . famotidine  20 mg Oral BID  . folic acid  1 mg Oral Daily  . levothyroxine  88 mcg Oral QAC breakfast  . losartan  50 mg Oral Daily  . mirtazapine  15 mg Oral QHS  .  multivitamin with minerals  1 tablet Oral Daily  . pantoprazole  40 mg Oral BID  . potassium chloride  20 mEq Oral BID  . spironolactone  25 mg Oral Daily  . sucralfate  1 g Oral TID WC & HS  . thiamine  100 mg Oral Daily   Or  . thiamine  100 mg Intravenous Daily   Continuous Infusions: . sodium chloride      Principal Problem:   Alcohol withdrawal (HCC) Active Problems:   Alcohol use disorder, severe, dependence (HCC)   HTN (hypertension)   Chronic systolic CHF (congestive heart failure) (HCC)   Hiccups   Hypokalemia    Time spent: 45 minutes    [facility] M NP  [facility] If 7PM-7AM, please contact night-coverage at [email or web address], password [facility] 02/22/2019, 12:50 PM  LOS: 1 day

## 2019-02-22 NOTE — Plan of Care (Signed)
  Problem: Pain Managment: Goal: General experience of comfort will improve Outcome: Not Progressing  C/O pain in head and chest discomfort. Medication provided as ordered.

## 2019-02-23 DIAGNOSIS — R066 Hiccough: Secondary | ICD-10-CM

## 2019-02-23 DIAGNOSIS — F10239 Alcohol dependence with withdrawal, unspecified: Secondary | ICD-10-CM

## 2019-02-23 LAB — BASIC METABOLIC PANEL
Anion gap: 12 (ref 5–15)
BUN: 6 mg/dL (ref 6–20)
CO2: 23 mmol/L (ref 22–32)
Calcium: 8.3 mg/dL — ABNORMAL LOW (ref 8.9–10.3)
Chloride: 102 mmol/L (ref 98–111)
Creatinine, Ser: 1.05 mg/dL (ref 0.61–1.24)
GFR calc Af Amer: >60 mL/min (ref >60)
GFR calc non Af Amer: >60 mL/min (ref >60)
Glucose, Bld: 88 mg/dL (ref 70–99)
Potassium: 3.7 mmol/L (ref 3.5–5.1)
Sodium: 137 mmol/L (ref 135–145)

## 2019-02-23 LAB — MAGNESIUM: Magnesium: 1.7 mg/dL (ref 1.7–2.4)

## 2019-02-23 LAB — PHOSPHORUS: Phosphorus: 3.7 mg/dL (ref 2.5–4.6)

## 2019-02-23 MED ORDER — GABAPENTIN 600 MG PO TABS: 300.0000 mg | ORAL_TABLET | Freq: Three times a day (TID) | ORAL | 0 refills | Status: DC

## 2019-02-23 MED ORDER — GABAPENTIN 600 MG PO TABS
300.0000 mg | ORAL_TABLET | Freq: Three times a day (TID) | ORAL | Status: DC
  Filled 2019-02-23: qty 1

## 2019-02-23 MED ORDER — KETOROLAC TROMETHAMINE 30 MG/ML IJ SOLN: 30.0000 mg | Freq: Once | INTRAMUSCULAR | Status: DC

## 2019-02-23 NOTE — Progress Notes (Signed)
Pt given discharge instructions, prescriptions, and care notes. Pt verbalized understanding AEB no further questions or concerns at this time. IV was discontinued, no redness, pain, or swelling noted at this time. Telemetry discontinued and Centralized Telemetry was notified. Pt left the floor via wheelchair with staff in stable condition. Skin dry and intact.

## 2019-02-23 NOTE — Progress Notes (Signed)
CSW received consult regarding ETOH abuse. CSW provided resources on chart as patient is ready for discharge.   Percell Locus Woodson Macha LCSW [phone]

## 2019-02-23 NOTE — Discharge Summary (Signed)
Physician Discharge Summary  Jacob Adams:[id number] DOB: 02-Jun-1966 DOA: 02/20/2019  PCP: System, Pcp Not In  Admit date: 02/20/2019 Discharge date: 02/23/2019  Admitted From: Home  Disposition:  Home   Recommendations for Outpatient Follow-up:  1. Follow up with PCP in 1-2 weeks 2. Please obtain BMP/CBC in one week     Home Health: None  Equipment/Devices: None  Discharge Condition: Good  CODE STATUS: FULL Diet recommendation: Regular  Brief/Interim Summary: Jacob Adams is a 53 y.o. male with a Past Medical History of anxiety depression, chronic systolic heart failure with known ejection fraction of 35%, severe noncompliance, alcoholism, recent negative cardiac cath who presented to the hospital with midsternal chest pain and alcohol withdrawal.  Patient is stated he was trying to quit for 3 days and had a 16 ounce beer followed by epigastric and chest pain.  She was scale was 23 on presentation.     PRINCIPAL HOSPITAL DIAGNOSIS: Alcohol withdrawal    Discharge Diagnoses:  Alcohol withdrawal Patient admitted and started on folate, thiamine and CIWA with on demand lorazepam.  CIWA scores low, 3-6 in last 24 hours, without getting any lorazepam.  Alcohol cessation recommended.  Started on gabapentin 300 TID for 1 month for subacute alcohol withdrawal.  Needs outpatient IOP.   Chronic systolic CHF Alcoholic NICM Appeared euvolemic.  EF 35%.  Chest pain This resolved.  ACS ruled out.  Hypertension Resumed home medications and improved.          Discharge Instructions  Discharge Instructions    Diet - low sodium heart healthy   Complete by:  As directed    Discharge instructions   Complete by:  As directed    From Dr. Maryfrances Bunnell: You were admitted for alcohol withdrawal and chest discomfort.   With regard to your chest discomfort, this was not from a heart attack, and your heart disease, congestive heart failure, appears to be stable (not worsening right  now). It is important for you to take all your medicines for your heart regularly, to make sure you DON'T get worsening of your heart failure.  Your heart medicines are: 1. Losartan - this is a blood pressure medicine, it also helps fix heart failure 2. Spironolactone - this is a water pill (makes you pee off extra water), and it also helps fix heart failure, and lower blood pressure 3. Carvedilol - this is a blood pressure medicine, it also helps fix heart failure and slow heart rate 4. Amlodipine - this is a blood pressure medicine 5. Atorvastatin - This is a cholesterol medicine     For your alcohol withdrawals: It looks like your bad withdrawals are mostly over with. You do not NEED any medicine at this point, but you NEED to stop drinking ANY ALCOHOL AT ALL. Over the next month to 2 months, you may have some mild withdrawal symptoms still (like feeling angry or irritable, feeling hard to sleep, etc). For these symptoms, you may try gabapentin 300 mg three times per day. This medicine might make you a little sleepy at first. Discuss it with your primary care doctor and follow up with them in 1 week   Increase activity slowly   Complete by:  As directed      Allergies as of 02/23/2019      Reactions   Uncoded Nonscreenable Allergen Other (See Comments)   Sun tan lotion: swelling and peeling   Acetaminophen Other (See Comments)   Make his side hurt; he stated he  has liver damage.    Ibuprofen Swelling   Swelling and discomfort of stomach   Nsaids Other (See Comments)   Swelling and discomfort of stomach   Penicillins Itching, Swelling, Rash   Has patient had a PCN reaction causing immediate rash, facial/tongue/throat swelling, SOB or lightheadedness with hypotension: yes Has patient had a PCN reaction causing severe rash involving mucus membranes or skin necrosis: no Has patient had a PCN reaction that required hospitalization yes, happened while hospitalized Has patient had a  PCN reaction occurring within the last 10 years: yes If all of the above answers are "NO", then may proceed with Cephalosporin use.   [name] Other (See Comments)   DOESN'T EAT PORK-patient preference      Medication List    STOP taking these medications   sucralfate 1 GM/10ML suspension Commonly known as:  Carafate     TAKE these medications   amLODipine 10 MG tablet Commonly known as:  NORVASC Take 10 mg by mouth daily.   atorvastatin 80 MG tablet Commonly known as:  LIPITOR Take 40 mg by mouth daily.   carvedilol 12.5 MG tablet Commonly known as:  COREG Take 1 tablet (12.5 mg total) by mouth 2 (two) times daily with a meal.   DULoxetine 30 MG capsule Commonly known as:  CYMBALTA Take 1 capsule (30 mg total) by mouth daily.   famotidine 20 MG tablet Commonly known as:  PEPCID Take 1 tablet (20 mg total) by mouth 2 (two) times daily.   FLUoxetine 20 MG capsule Commonly known as:  PROZAC Take 20 mg by mouth daily.   gabapentin 600 MG tablet Commonly known as:  NEURONTIN Take 0.5 tablets (300 mg total) by mouth 3 (three) times daily.   levothyroxine 88 MCG tablet Commonly known as:  SYNTHROID Take 88 mcg by mouth daily before breakfast.   losartan 50 MG tablet Commonly known as:  COZAAR Take 1 tablet (50 mg total) by mouth daily.   mirtazapine 15 MG tablet Commonly known as:  REMERON Take 15 mg by mouth at bedtime.   pantoprazole 40 MG tablet Commonly known as:  PROTONIX Take 1 tablet (40 mg total) by mouth 2 (two) times daily.   spironolactone 25 MG tablet Commonly known as:  ALDACTONE Take 1 tablet (25 mg total) by mouth daily.       Allergies  Allergen Reactions  . Uncoded Nonscreenable Allergen Other (See Comments)    Sun tan lotion: swelling and peeling  . Acetaminophen Other (See Comments)    Make his side hurt; he stated he has liver damage.   . Ibuprofen Swelling    Swelling and discomfort of stomach  . Nsaids Other (See  Comments)    Swelling and discomfort of stomach  . Penicillins Itching, Swelling and Rash    Has patient had a PCN reaction causing immediate rash, facial/tongue/throat swelling, SOB or lightheadedness with hypotension: yes Has patient had a PCN reaction causing severe rash involving mucus membranes or skin necrosis: no Has patient had a PCN reaction that required hospitalization yes, happened while hospitalized Has patient had a PCN reaction occurring within the last 10 years: yes If all of the above answers are "NO", then may proceed with Cephalosporin use.   . [name] Other (See Comments)    DOESN'T EAT PORK-patient preference    Consultations:  None   Procedures/Studies: Dg Chest 2 View  Result Date: 02/20/2019 CLINICAL DATA:  Mid chest pain. EXAM: CHEST - 2 VIEW COMPARISON:  February 08, 2019 FINDINGS: The heart size and mediastinal contours are within normal limits. Both lungs are clear. The visualized skeletal structures are unremarkable. IMPRESSION: No active cardiopulmonary disease. Electronically Signed   By: Gerome Sam III M.D   On: 02/20/2019 17:11   Dg Chest 2 View  Result Date: 02/08/2019 CLINICAL DATA:  53 year old male with chest pain. EXAM: CHEST - 2 VIEW COMPARISON:  CTA chest 01/07/2019 and earlier. FINDINGS: Upright AP and lateral views. Stable lung volumes and mediastinal contours. Mild chronic curvilinear scarring in the lingula as demonstrated on the prior CTA. No pneumothorax, pulmonary edema, pleural effusion or acute pulmonary opacity. Visualized tracheal air column is within normal limits. Partially visible humerus ORIF. No acute osseous abnormality identified. Negative visible bowel gas pattern. IMPRESSION: No acute cardiopulmonary abnormality. Electronically Signed   By: Odessa Fleming M.D.   On: 02/08/2019 05:03       Subjective: Mild tremor remains.  No confusion, hallucinations, seizures.  Still hiccups, severe.  No vomiting, diarrhea, chest  pain, dyspnea.  Discharge Exam: Vitals:   02/23/19 0505 02/23/19 1255  BP: 122/88 128/88  Pulse: 87 85  Resp: 18 18  Temp: 98.9 F (37.2 C) 98.4 F (36.9 C)  SpO2: 99% 100%   Vitals:   02/22/19 2359 02/23/19 0500 02/23/19 0505 02/23/19 1255  BP:  122/88 122/88 128/88  Pulse:  87 87 85  Resp:  18 18 18   Temp: 98.5 F (36.9 C) 98.9 F (37.2 C) 98.9 F (37.2 C) 98.4 F (36.9 C)  TempSrc: Oral  Oral   SpO2:  99% 99% 100%  Weight:  101.7 kg    Height:        General: Pt is alert, awake, not in acute distress Cardiovascular: RRR, nl S1-S2, no murmurs appreciated.   No LE edema.   Respiratory: Normal respiratory rate and rhythm.  CTAB without rales or wheezes. Abdominal: Abdomen soft and non-tender.  No distension or HSM.   Neuro/Psych: Strength symmetric in upper and lower extremities.  Judgment and insight appear normal.   The results of significant diagnostics from this hospitalization (including imaging, microbiology, ancillary and laboratory) are listed below for reference.     Microbiology: Recent Results (from the past 240 hour(s))  SARS Coronavirus 2 (CEPHEID - Performed in [facility] hospital lab), Hosp Order     Status: None   Collection Time: 02/20/19  7:42 PM  Result Value Ref Range Status   SARS Coronavirus 2 NEGATIVE NEGATIVE Final    Comment: (NOTE) If result is NEGATIVE SARS-CoV-2 target nucleic acids are NOT DETECTED. The SARS-CoV-2 RNA is generally detectable in upper and lower  respiratory specimens during the acute phase of infection. The lowest  concentration of SARS-CoV-2 viral copies this assay can detect is 250  copies / mL. A negative result does not preclude SARS-CoV-2 infection  and should not be used as the sole basis for treatment or other  patient management decisions.  A negative result may occur with  improper specimen collection / handling, submission of specimen other  than nasopharyngeal swab, presence of viral mutation(s) within the   areas targeted by this assay, and inadequate number of viral copies  (<250 copies / mL). A negative result must be combined with clinical  observations, patient history, and epidemiological information. If result is POSITIVE SARS-CoV-2 target nucleic acids are DETECTED. The SARS-CoV-2 RNA is generally detectable in upper and lower  respiratory specimens dur ing the acute phase of infection.  Positive  results are  indicative of active infection with SARS-CoV-2.  Clinical  correlation with patient history and other diagnostic information is  necessary to determine patient infection status.  Positive results do  not rule out bacterial infection or co-infection with other viruses. If result is PRESUMPTIVE POSTIVE SARS-CoV-2 nucleic acids MAY BE PRESENT.   A presumptive positive result was obtained on the submitted specimen  and confirmed on repeat testing.  While 2019 novel coronavirus  (SARS-CoV-2) nucleic acids may be present in the submitted sample  additional confirmatory testing may be necessary for epidemiological  and / or clinical management purposes  to differentiate between  SARS-CoV-2 and other Sarbecovirus currently known to infect humans.  If clinically indicated additional testing with an alternate test  methodology [phone]) is advised. The SARS-CoV-2 RNA is generally  detectable in upper and lower respiratory sp ecimens during the acute  phase of infection. The expected result is Negative. Fact Sheet for Patients:  BoilerBrush.com.cy Fact Sheet for Healthcare Providers: https://pope.com/ This test is not yet approved or cleared by the [place] FDA and has been authorized for detection and/or diagnosis of SARS-CoV-2 by FDA under an Emergency Use Authorization (EUA).  This EUA will remain in effect (meaning this test can be used) for the duration of the COVID-19 declaration under Section 564(b)(1) of the Act, 21  U.S.C. section 360bbb-3(b)(1), unless the authorization is terminated or revoked sooner. Performed at [facility], [address] [place], [place] [address]      Labs: BNP (last 3 results) Recent Labs    01/07/19 2000 02/08/19 0428 02/20/19 1722  BNP 16.8 20.6 18.9   Basic Metabolic Panel: Recent Labs  Lab 02/20/19 1722 02/21/19 0336 02/22/19 0309 02/23/19 0243  NA 137 134* 136 137  K 3.6 3.4* 3.4* 3.7  CL 100 102 102 102  CO2 17* 20* 21* 23  GLUCOSE 81 118* 118* 88  BUN 7 6 6 6   CREATININE 0.85 0.98 0.96 1.05  CALCIUM 9.9 8.9 8.7* 8.3*  MG  --   --   --  1.7  PHOS  --   --   --  3.7   Liver Function Tests: Recent Labs  Lab 02/20/19 1722  AST 49*  ALT 28  ALKPHOS 56  BILITOT 0.8  PROT 7.4  ALBUMIN 4.0   Recent Labs  Lab 02/20/19 1722  LIPASE 63*   No results for input(s): AMMONIA in the last 168 hours. CBC: Recent Labs  Lab 02/20/19 1722 02/21/19 0336  WBC 3.1* 4.6  NEUTROABS 1.7  --   HGB 13.3 13.1  HCT 38.5* 38.2*  MCV 87.9 87.6  PLT 265 254   Cardiac Enzymes: Recent Labs  Lab 02/20/19 1722  TROPONINI <0.03   BNP: Invalid input(s): POCBNP CBG: No results for input(s): GLUCAP in the last 168 hours. D-Dimer No results for input(s): DDIMER in the last 72 hours. Hgb A1c No results for input(s): HGBA1C in the last 72 hours. Lipid Profile No results for input(s): CHOL, HDL, LDLCALC, TRIG, CHOLHDL, LDLDIRECT in the last 72 hours. Thyroid function studies No results for input(s): TSH, T4TOTAL, T3FREE, THYROIDAB in the last 72 hours.  Invalid input(s): FREET3 Anemia work up No results for input(s): VITAMINB12, FOLATE, FERRITIN, TIBC, IRON, RETICCTPCT in the last 72 hours. Urinalysis    Component Value Date/Time   COLORURINE YELLOW 02/20/2019 1307   APPEARANCEUR CLEAR 02/20/2019 1307   LABSPEC 1.024 02/20/2019 1307   PHURINE 5.0 02/20/2019 1307   GLUCOSEU NEGATIVE 02/20/2019 1307  HGBUR SMALL (A) 02/20/2019 1307    BILIRUBINUR NEGATIVE 02/20/2019 1307   KETONESUR 20 (A) 02/20/2019 1307   PROTEINUR 100 (A) 02/20/2019 1307   UROBILINOGEN 0.2 08/25/2013 2023   NITRITE NEGATIVE 02/20/2019 1307   LEUKOCYTESUR NEGATIVE 02/20/2019 1307   Sepsis Labs Invalid input(s): PROCALCITONIN,  WBC,  LACTICIDVEN Microbiology Recent Results (from the past 240 hour(s))  SARS Coronavirus 2 (CEPHEID - Performed in [facility] hospital lab), Hosp Order     Status: None   Collection Time: 02/20/19  7:42 PM  Result Value Ref Range Status   SARS Coronavirus 2 NEGATIVE NEGATIVE Final    Comment: (NOTE) If result is NEGATIVE SARS-CoV-2 target nucleic acids are NOT DETECTED. The SARS-CoV-2 RNA is generally detectable in upper and lower  respiratory specimens during the acute phase of infection. The lowest  concentration of SARS-CoV-2 viral copies this assay can detect is 250  copies / mL. A negative result does not preclude SARS-CoV-2 infection  and should not be used as the sole basis for treatment or other  patient management decisions.  A negative result may occur with  improper specimen collection / handling, submission of specimen other  than nasopharyngeal swab, presence of viral mutation(s) within the  areas targeted by this assay, and inadequate number of viral copies  (<250 copies / mL). A negative result must be combined with clinical  observations, patient history, and epidemiological information. If result is POSITIVE SARS-CoV-2 target nucleic acids are DETECTED. The SARS-CoV-2 RNA is generally detectable in upper and lower  respiratory specimens dur ing the acute phase of infection.  Positive  results are indicative of active infection with SARS-CoV-2.  Clinical  correlation with patient history and other diagnostic information is  necessary to determine patient infection status.  Positive results do  not rule out bacterial infection or co-infection with other viruses. If result is PRESUMPTIVE  POSTIVE SARS-CoV-2 nucleic acids MAY BE PRESENT.   A presumptive positive result was obtained on the submitted specimen  and confirmed on repeat testing.  While 2019 novel coronavirus  (SARS-CoV-2) nucleic acids may be present in the submitted sample  additional confirmatory testing may be necessary for epidemiological  and / or clinical management purposes  to differentiate between  SARS-CoV-2 and other Sarbecovirus currently known to infect humans.  If clinically indicated additional testing with an alternate test  methodology [phone]) is advised. The SARS-CoV-2 RNA is generally  detectable in upper and lower respiratory sp ecimens during the acute  phase of infection. The expected result is Negative. Fact Sheet for Patients:  BoilerBrush.com.cy Fact Sheet for Healthcare Providers: https://pope.com/ This test is not yet approved or cleared by the [place] FDA and has been authorized for detection and/or diagnosis of SARS-CoV-2 by FDA under an Emergency Use Authorization (EUA).  This EUA will remain in effect (meaning this test can be used) for the duration of the COVID-19 declaration under Section 564(b)(1) of the Act, 21 U.S.C. section 360bbb-3(b)(1), unless the authorization is terminated or revoked sooner. Performed at [facility], [address] [place], [place] [address]      Time coordinating discharge: 25 minutes      SIGNED:   Alberteen Sam, MD  [facility] 02/23/2019, 5:18 PM

## 2019-08-13 ENCOUNTER — Encounter (HOSPITAL_COMMUNITY): Payer: Self-pay | Admitting: Emergency Medicine

## 2019-08-13 ENCOUNTER — Emergency Department (HOSPITAL_COMMUNITY): Payer: No Typology Code available for payment source

## 2019-08-13 ENCOUNTER — Emergency Department (HOSPITAL_COMMUNITY)
Admission: EM | Admit: 2019-08-13 | Discharge: 2019-08-14 | Disposition: A | Discharge status: Home / Self Care | Payer: No Typology Code available for payment source | Source: Home / Self Care | Attending: Emergency Medicine | Admitting: Emergency Medicine

## 2019-08-13 ENCOUNTER — Other Ambulatory Visit: Payer: Self-pay

## 2019-08-13 DIAGNOSIS — Z20828 Contact with and (suspected) exposure to other viral communicable diseases: Secondary | ICD-10-CM | POA: Insufficient documentation

## 2019-08-13 DIAGNOSIS — I509 Heart failure, unspecified: Secondary | ICD-10-CM | POA: Insufficient documentation

## 2019-08-13 DIAGNOSIS — K625 Hemorrhage of anus and rectum: Secondary | ICD-10-CM

## 2019-08-13 DIAGNOSIS — I110 Hypertensive heart disease with heart failure: Secondary | ICD-10-CM | POA: Insufficient documentation

## 2019-08-13 DIAGNOSIS — A419 Sepsis, unspecified organism: Secondary | ICD-10-CM | POA: Diagnosis not present

## 2019-08-13 DIAGNOSIS — Z20822 Contact with and (suspected) exposure to covid-19: Secondary | ICD-10-CM

## 2019-08-13 DIAGNOSIS — Z79899 Other long term (current) drug therapy: Secondary | ICD-10-CM | POA: Insufficient documentation

## 2019-08-13 DIAGNOSIS — R0602 Shortness of breath: Secondary | ICD-10-CM | POA: Diagnosis not present

## 2019-08-13 DIAGNOSIS — F17210 Nicotine dependence, cigarettes, uncomplicated: Secondary | ICD-10-CM | POA: Insufficient documentation

## 2019-08-13 LAB — TROPONIN I (HIGH SENSITIVITY)
Troponin I (High Sensitivity): 8 ng/L (ref <18)
Troponin I (High Sensitivity): 5 ng/L (ref <18)

## 2019-08-13 LAB — CBC
HCT: 33.2 % — ABNORMAL LOW (ref 39.0–52.0)
Hemoglobin: 9.9 g/dL — ABNORMAL LOW (ref 13.0–17.0)
MCH: 29 pg (ref 26.0–34.0)
MCHC: 29.8 g/dL — ABNORMAL LOW (ref 30.0–36.0)
MCV: 97.4 fL (ref 80.0–100.0)
Platelets: 159 10*3/uL (ref 150–400)
RBC: 3.41 MIL/uL — ABNORMAL LOW (ref 4.22–5.81)
RDW: 17.7 % — ABNORMAL HIGH (ref 11.5–15.5)
WBC: 8.6 10*3/uL (ref 4.0–10.5)
nRBC: 0 % (ref 0.0–0.2)

## 2019-08-13 LAB — LACTIC ACID, PLASMA
Lactic Acid, Venous: 2.1 mmol/L (ref 0.5–1.9)
Lactic Acid, Venous: 2.2 mmol/L (ref 0.5–1.9)

## 2019-08-13 LAB — BASIC METABOLIC PANEL
Anion gap: 15 (ref 5–15)
BUN: 6 mg/dL (ref 6–20)
CO2: 17 mmol/L — ABNORMAL LOW (ref 22–32)
Calcium: 8.9 mg/dL (ref 8.9–10.3)
Chloride: 108 mmol/L (ref 98–111)
Creatinine, Ser: 0.98 mg/dL (ref 0.61–1.24)
GFR calc Af Amer: >60 mL/min (ref >60)
GFR calc non Af Amer: >60 mL/min (ref >60)
Glucose, Bld: 103 mg/dL — ABNORMAL HIGH (ref 70–99)
Potassium: 3.7 mmol/L (ref 3.5–5.1)
Sodium: 140 mmol/L (ref 135–145)

## 2019-08-13 LAB — D-DIMER, QUANTITATIVE: D-Dimer, Quant: <0.27 ug/mL-FEU (ref 0.00–0.50)

## 2019-08-13 LAB — POC OCCULT BLOOD, ED: Fecal Occult Bld: POSITIVE — AB

## 2019-08-13 MED ORDER — ACETAMINOPHEN 500 MG PO TABS
1000.0000 mg | ORAL_TABLET | Freq: Once | ORAL | Status: AC
  Administered 2019-08-13: 1000 mg via ORAL
  Filled 2019-08-13: qty 2

## 2019-08-13 MED ORDER — ALBUTEROL SULFATE HFA 108 (90 BASE) MCG/ACT IN AERS
2.0000 | INHALATION_SPRAY | Freq: Once | RESPIRATORY_TRACT | Status: AC
  Administered 2019-08-13: 2 via RESPIRATORY_TRACT
  Filled 2019-08-13: qty 6.7

## 2019-08-13 MED ORDER — SODIUM CHLORIDE 0.9% FLUSH: 3.0000 mL | Freq: Once | INTRAVENOUS | Status: DC

## 2019-08-13 MED ORDER — SODIUM CHLORIDE 0.9 % IV BOLUS
1000.0000 mL | Freq: Once | INTRAVENOUS | Status: AC
  Administered 2019-08-13: 1000 mL via INTRAVENOUS

## 2019-08-13 NOTE — ED Notes (Signed)
Denies CP
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
## 2019-08-13 NOTE — ED Notes (Signed)
Attempted IV x2, no success. Second RN to attempt

## 2019-08-13 NOTE — ED Triage Notes (Signed)
C/o fever (101.9), SOB, pain to center of chest, headache, and body aches x 2 hours.  No known COVID exposures.

## 2019-08-13 NOTE — ED Notes (Addendum)
Tylenol and Ibuprofen not given at triage due to reported allergies.  Working on getting pt to treatment room.

## 2019-08-13 NOTE — ED Notes (Signed)
Second lactic acid to be collected after 2244

## 2019-08-13 NOTE — Discharge Instructions (Addendum)
You were give an albuterol inhaler for your symptoms. Please use two puff of the inhaler every 4-6 hours as needed for shortness of breath or cough. Take as directed. Rotate tylenol and motrin to treat your fevers and body aches. Stay well hydrated.   Today you were were tested for the coronavirus.  The results will be available in the next 3 to 5 days.  If the results are positive the hospital will contact you.  If they are negative the hospital would not contact you.  You will need to self quarantine until you are aware of your results.  If they are positive you will need to self quarantine as directed below.  You should be isolated for at least 7 days since the onset of your symptoms AND >72 hours after symptoms resolution (absence of fever without the use of fever reducing medication and improvement in respiratory symptoms), whichever is longer  Please follow up with your primary care provider within 5-7 days for re-evaluation of your symptoms.   With regard to the bleeding that you are having from your rectum.  I do believe that this is likely from your history of internal hemorrhoids.  However, I do recommend that you follow-up with your gastroenterology doctor in regards to this since it has been ongoing for several months.  Please return to the emergency department for any new or worsening symptoms.

## 2019-08-14 LAB — SARS CORONAVIRUS 2 (TAT 6-24 HRS): SARS Coronavirus 2: NEGATIVE

## 2019-08-14 NOTE — ED Notes (Signed)
Discharge instructions including: inhaler, body aches/temp management, GI follow up, and COVID isolation. Pt verbalized understanding with no questions at this time. Pt to go home with wife.

## 2019-08-15 ENCOUNTER — Emergency Department (HOSPITAL_COMMUNITY): Payer: No Typology Code available for payment source

## 2019-08-15 ENCOUNTER — Encounter (HOSPITAL_COMMUNITY): Payer: Self-pay | Admitting: *Deleted

## 2019-08-15 ENCOUNTER — Telehealth (HOSPITAL_COMMUNITY): Payer: Self-pay

## 2019-08-15 ENCOUNTER — Other Ambulatory Visit: Payer: Self-pay

## 2019-08-15 ENCOUNTER — Inpatient Hospital Stay (HOSPITAL_COMMUNITY)
Admission: EM | Admit: 2019-08-15 | Discharge: 2019-08-18 | DRG: 872 | Disposition: A | Discharge status: Home / Self Care | Payer: No Typology Code available for payment source | Attending: Family Medicine | Admitting: Family Medicine

## 2019-08-15 DIAGNOSIS — E876 Hypokalemia: Secondary | ICD-10-CM | POA: Diagnosis present

## 2019-08-15 DIAGNOSIS — I110 Hypertensive heart disease with heart failure: Secondary | ICD-10-CM | POA: Diagnosis present

## 2019-08-15 DIAGNOSIS — N179 Acute kidney failure, unspecified: Secondary | ICD-10-CM | POA: Diagnosis not present

## 2019-08-15 DIAGNOSIS — D62 Acute posthemorrhagic anemia: Secondary | ICD-10-CM | POA: Diagnosis present

## 2019-08-15 DIAGNOSIS — K644 Residual hemorrhoidal skin tags: Secondary | ICD-10-CM | POA: Diagnosis present

## 2019-08-15 DIAGNOSIS — R197 Diarrhea, unspecified: Secondary | ICD-10-CM | POA: Diagnosis present

## 2019-08-15 DIAGNOSIS — A040 Enteropathogenic Escherichia coli infection: Secondary | ICD-10-CM | POA: Diagnosis present

## 2019-08-15 DIAGNOSIS — R7303 Prediabetes: Secondary | ICD-10-CM | POA: Diagnosis present

## 2019-08-15 DIAGNOSIS — Z886 Allergy status to analgesic agent status: Secondary | ICD-10-CM

## 2019-08-15 DIAGNOSIS — K529 Noninfective gastroenteritis and colitis, unspecified: Secondary | ICD-10-CM | POA: Diagnosis not present

## 2019-08-15 DIAGNOSIS — Z23 Encounter for immunization: Secondary | ICD-10-CM

## 2019-08-15 DIAGNOSIS — Z8719 Personal history of other diseases of the digestive system: Secondary | ICD-10-CM

## 2019-08-15 DIAGNOSIS — R778 Other specified abnormalities of plasma proteins: Secondary | ICD-10-CM

## 2019-08-15 DIAGNOSIS — Z20828 Contact with and (suspected) exposure to other viral communicable diseases: Secondary | ICD-10-CM | POA: Diagnosis present

## 2019-08-15 DIAGNOSIS — F1010 Alcohol abuse, uncomplicated: Secondary | ICD-10-CM | POA: Diagnosis present

## 2019-08-15 DIAGNOSIS — I255 Ischemic cardiomyopathy: Secondary | ICD-10-CM | POA: Diagnosis present

## 2019-08-15 DIAGNOSIS — Z79890 Hormone replacement therapy (postmenopausal): Secondary | ICD-10-CM

## 2019-08-15 DIAGNOSIS — Z800 Family history of malignant neoplasm of digestive organs: Secondary | ICD-10-CM

## 2019-08-15 DIAGNOSIS — R6520 Severe sepsis without septic shock: Secondary | ICD-10-CM | POA: Diagnosis present

## 2019-08-15 DIAGNOSIS — I10 Essential (primary) hypertension: Secondary | ICD-10-CM | POA: Diagnosis present

## 2019-08-15 DIAGNOSIS — I5022 Chronic systolic (congestive) heart failure: Secondary | ICD-10-CM | POA: Diagnosis present

## 2019-08-15 DIAGNOSIS — F329 Major depressive disorder, single episode, unspecified: Secondary | ICD-10-CM | POA: Diagnosis present

## 2019-08-15 DIAGNOSIS — G4733 Obstructive sleep apnea (adult) (pediatric): Secondary | ICD-10-CM | POA: Diagnosis present

## 2019-08-15 DIAGNOSIS — Z8711 Personal history of peptic ulcer disease: Secondary | ICD-10-CM

## 2019-08-15 DIAGNOSIS — F1020 Alcohol dependence, uncomplicated: Secondary | ICD-10-CM | POA: Diagnosis present

## 2019-08-15 DIAGNOSIS — A419 Sepsis, unspecified organism: Principal | ICD-10-CM | POA: Diagnosis present

## 2019-08-15 DIAGNOSIS — E860 Dehydration: Secondary | ICD-10-CM | POA: Diagnosis present

## 2019-08-15 DIAGNOSIS — K921 Melena: Secondary | ICD-10-CM | POA: Diagnosis not present

## 2019-08-15 DIAGNOSIS — E785 Hyperlipidemia, unspecified: Secondary | ICD-10-CM | POA: Diagnosis present

## 2019-08-15 DIAGNOSIS — E669 Obesity, unspecified: Secondary | ICD-10-CM | POA: Diagnosis present

## 2019-08-15 DIAGNOSIS — D649 Anemia, unspecified: Secondary | ICD-10-CM | POA: Diagnosis present

## 2019-08-15 DIAGNOSIS — J8410 Pulmonary fibrosis, unspecified: Secondary | ICD-10-CM | POA: Diagnosis present

## 2019-08-15 DIAGNOSIS — K641 Second degree hemorrhoids: Secondary | ICD-10-CM | POA: Diagnosis present

## 2019-08-15 DIAGNOSIS — F319 Bipolar disorder, unspecified: Secondary | ICD-10-CM | POA: Diagnosis present

## 2019-08-15 DIAGNOSIS — E039 Hypothyroidism, unspecified: Secondary | ICD-10-CM | POA: Diagnosis present

## 2019-08-15 DIAGNOSIS — Z79899 Other long term (current) drug therapy: Secondary | ICD-10-CM

## 2019-08-15 DIAGNOSIS — A072 Cryptosporidiosis: Secondary | ICD-10-CM | POA: Diagnosis present

## 2019-08-15 DIAGNOSIS — K219 Gastro-esophageal reflux disease without esophagitis: Secondary | ICD-10-CM | POA: Diagnosis present

## 2019-08-15 DIAGNOSIS — F32A Depression, unspecified: Secondary | ICD-10-CM | POA: Diagnosis present

## 2019-08-15 DIAGNOSIS — F17210 Nicotine dependence, cigarettes, uncomplicated: Secondary | ICD-10-CM | POA: Diagnosis present

## 2019-08-15 DIAGNOSIS — G43909 Migraine, unspecified, not intractable, without status migrainosus: Secondary | ICD-10-CM | POA: Diagnosis present

## 2019-08-15 DIAGNOSIS — D500 Iron deficiency anemia secondary to blood loss (chronic): Secondary | ICD-10-CM | POA: Diagnosis not present

## 2019-08-15 DIAGNOSIS — A045 Campylobacter enteritis: Secondary | ICD-10-CM | POA: Diagnosis present

## 2019-08-15 DIAGNOSIS — M19079 Primary osteoarthritis, unspecified ankle and foot: Secondary | ICD-10-CM | POA: Diagnosis present

## 2019-08-15 DIAGNOSIS — Z880 Allergy status to penicillin: Secondary | ICD-10-CM

## 2019-08-15 DIAGNOSIS — F209 Schizophrenia, unspecified: Secondary | ICD-10-CM | POA: Diagnosis present

## 2019-08-15 DIAGNOSIS — I428 Other cardiomyopathies: Secondary | ICD-10-CM | POA: Diagnosis present

## 2019-08-15 DIAGNOSIS — R0602 Shortness of breath: Secondary | ICD-10-CM | POA: Diagnosis present

## 2019-08-15 DIAGNOSIS — A09 Infectious gastroenteritis and colitis, unspecified: Secondary | ICD-10-CM

## 2019-08-15 DIAGNOSIS — R7989 Other specified abnormal findings of blood chemistry: Secondary | ICD-10-CM | POA: Diagnosis present

## 2019-08-15 DIAGNOSIS — Z6833 Body mass index (BMI) 33.0-33.9, adult: Secondary | ICD-10-CM

## 2019-08-15 LAB — CBC WITH DIFFERENTIAL/PLATELET
Abs Immature Granulocytes: 0.05 10*3/uL (ref 0.00–0.07)
Abs Immature Granulocytes: 0.03 10*3/uL (ref 0.00–0.07)
Basophils Absolute: 0 10*3/uL (ref 0.0–0.1)
Basophils Absolute: 0 10*3/uL (ref 0.0–0.1)
Basophils Relative: 0 %
Basophils Relative: 0 %
Eosinophils Absolute: 0 10*3/uL (ref 0.0–0.5)
Eosinophils Absolute: 0 10*3/uL (ref 0.0–0.5)
Eosinophils Relative: 0 %
Eosinophils Relative: 0 %
HCT: 30.8 % — ABNORMAL LOW (ref 39.0–52.0)
HCT: 29.6 % — ABNORMAL LOW (ref 39.0–52.0)
Hemoglobin: 9.6 g/dL — ABNORMAL LOW (ref 13.0–17.0)
Hemoglobin: 9.3 g/dL — ABNORMAL LOW (ref 13.0–17.0)
Immature Granulocytes: 1 %
Immature Granulocytes: 0 %
Lymphocytes Relative: 12 %
Lymphocytes Relative: 13 %
Lymphs Abs: 1.2 10*3/uL (ref 0.7–4.0)
Lymphs Abs: 1.1 10*3/uL (ref 0.7–4.0)
MCH: 27.8 pg (ref 26.0–34.0)
MCH: 28 pg (ref 26.0–34.0)
MCHC: 31.2 g/dL (ref 30.0–36.0)
MCHC: 31.4 g/dL (ref 30.0–36.0)
MCV: 89.3 fL (ref 80.0–100.0)
MCV: 89.2 fL (ref 80.0–100.0)
Monocytes Absolute: 0.8 10*3/uL (ref 0.1–1.0)
Monocytes Absolute: 0.8 10*3/uL (ref 0.1–1.0)
Monocytes Relative: 8 %
Monocytes Relative: 9 %
Neutro Abs: 8.2 10*3/uL — ABNORMAL HIGH (ref 1.7–7.7)
Neutro Abs: 6.6 10*3/uL (ref 1.7–7.7)
Neutrophils Relative %: 79 %
Neutrophils Relative %: 78 %
Platelets: 344 10*3/uL (ref 150–400)
Platelets: 304 10*3/uL (ref 150–400)
RBC: 3.45 MIL/uL — ABNORMAL LOW (ref 4.22–5.81)
RBC: 3.32 MIL/uL — ABNORMAL LOW (ref 4.22–5.81)
RDW: 17.5 % — ABNORMAL HIGH (ref 11.5–15.5)
RDW: 17.1 % — ABNORMAL HIGH (ref 11.5–15.5)
WBC: 10.3 10*3/uL (ref 4.0–10.5)
WBC: 8.6 10*3/uL (ref 4.0–10.5)
nRBC: 0 % (ref 0.0–0.2)
nRBC: 0 % (ref 0.0–0.2)

## 2019-08-15 LAB — LIPASE, BLOOD: Lipase: 35 U/L (ref 11–51)

## 2019-08-15 LAB — URINALYSIS, MICROSCOPIC (REFLEX)
Bacteria, UA: NONE SEEN
Squamous Epithelial / HPF: NONE SEEN (ref 0–5)

## 2019-08-15 LAB — PROTIME-INR
INR: 1.1 (ref 0.8–1.2)
Prothrombin Time: 14.4 seconds (ref 11.4–15.2)

## 2019-08-15 LAB — RAPID URINE DRUG SCREEN, HOSP PERFORMED
Amphetamines: NOT DETECTED
Barbiturates: NOT DETECTED
Benzodiazepines: NOT DETECTED
Cocaine: NOT DETECTED
Opiates: NOT DETECTED
Tetrahydrocannabinol: NOT DETECTED

## 2019-08-15 LAB — URINALYSIS, ROUTINE W REFLEX MICROSCOPIC
Bilirubin Urine: NEGATIVE
Glucose, UA: NEGATIVE mg/dL
Ketones, ur: NEGATIVE mg/dL
Leukocytes,Ua: NEGATIVE
Nitrite: NEGATIVE
Protein, ur: 100 mg/dL — AB
Specific Gravity, Urine: 1.025 (ref 1.005–1.030)
pH: 5.5 (ref 5.0–8.0)

## 2019-08-15 LAB — TROPONIN I (HIGH SENSITIVITY)
Troponin I (High Sensitivity): 18 ng/L — ABNORMAL HIGH (ref <18)
Troponin I (High Sensitivity): 22 ng/L — ABNORMAL HIGH (ref <18)
Troponin I (High Sensitivity): 17 ng/L (ref <18)

## 2019-08-15 LAB — COMPREHENSIVE METABOLIC PANEL
ALT: 20 U/L (ref 0–44)
AST: 29 U/L (ref 15–41)
Albumin: 3.3 g/dL — ABNORMAL LOW (ref 3.5–5.0)
Alkaline Phosphatase: 52 U/L (ref 38–126)
Anion gap: 13 (ref 5–15)
BUN: 8 mg/dL (ref 6–20)
CO2: 16 mmol/L — ABNORMAL LOW (ref 22–32)
Calcium: 8.4 mg/dL — ABNORMAL LOW (ref 8.9–10.3)
Chloride: 106 mmol/L (ref 98–111)
Creatinine, Ser: 1.32 mg/dL — ABNORMAL HIGH (ref 0.61–1.24)
GFR calc Af Amer: >60 mL/min (ref >60)
GFR calc non Af Amer: >60 mL/min (ref >60)
Glucose, Bld: 144 mg/dL — ABNORMAL HIGH (ref 70–99)
Potassium: 2.9 mmol/L — ABNORMAL LOW (ref 3.5–5.1)
Sodium: 135 mmol/L (ref 135–145)
Total Bilirubin: 0.5 mg/dL (ref 0.3–1.2)
Total Protein: 7.4 g/dL (ref 6.5–8.1)

## 2019-08-15 LAB — C DIFFICILE QUICK SCREEN W PCR REFLEX
C Diff antigen: NEGATIVE
C Diff interpretation: NOT DETECTED
C Diff toxin: NEGATIVE

## 2019-08-15 LAB — TYPE AND SCREEN
ABO/RH(D): O POS
Antibody Screen: NEGATIVE

## 2019-08-15 LAB — APTT: aPTT: 43 seconds — ABNORMAL HIGH (ref 24–36)

## 2019-08-15 LAB — BRAIN NATRIURETIC PEPTIDE: B Natriuretic Peptide: 174.3 pg/mL — ABNORMAL HIGH (ref 0.0–100.0)

## 2019-08-15 LAB — SARS CORONAVIRUS 2 (TAT 6-24 HRS): SARS Coronavirus 2: NEGATIVE

## 2019-08-15 LAB — LACTIC ACID, PLASMA: Lactic Acid, Venous: 1.6 mmol/L (ref 0.5–1.9)

## 2019-08-15 LAB — PROCALCITONIN: Procalcitonin: 1.27 ng/mL

## 2019-08-15 LAB — MAGNESIUM: Magnesium: 1.5 mg/dL — ABNORMAL LOW (ref 1.7–2.4)

## 2019-08-15 MED ORDER — ATORVASTATIN CALCIUM 40 MG PO TABS
40.0000 mg | ORAL_TABLET | Freq: Every day | ORAL | Status: DC
  Administered 2019-08-16 – 2019-08-18 (×3): 40 mg via ORAL
  Filled 2019-08-15 (×3): qty 1

## 2019-08-15 MED ORDER — FOLIC ACID 1 MG PO TABS
1.0000 mg | ORAL_TABLET | Freq: Every day | ORAL | Status: DC
  Administered 2019-08-15 – 2019-08-18 (×4): 1 mg via ORAL
  Filled 2019-08-15 (×4): qty 1

## 2019-08-15 MED ORDER — NITROGLYCERIN 0.4 MG SL SUBL: 0.4000 mg | SUBLINGUAL_TABLET | SUBLINGUAL | Status: DC | PRN

## 2019-08-15 MED ORDER — SODIUM CHLORIDE 0.9 % IV BOLUS
1000.0000 mL | Freq: Once | INTRAVENOUS | Status: AC
  Administered 2019-08-15: 17:00:00 1000 mL via INTRAVENOUS

## 2019-08-15 MED ORDER — VITAMIN B-1 100 MG PO TABS
100.0000 mg | ORAL_TABLET | Freq: Every day | ORAL | Status: DC
  Administered 2019-08-16 – 2019-08-17 (×3): 100 mg via ORAL
  Filled 2019-08-15 (×4): qty 1

## 2019-08-15 MED ORDER — DULOXETINE HCL 30 MG PO CPEP
30.0000 mg | ORAL_CAPSULE | Freq: Every day | ORAL | Status: DC
  Administered 2019-08-16 – 2019-08-18 (×3): 30 mg via ORAL
  Filled 2019-08-15 (×4): qty 1

## 2019-08-15 MED ORDER — LIDOCAINE-PRILOCAINE 2.5-2.5 % EX CREA
1.0000 "application " | TOPICAL_CREAM | Freq: Two times a day (BID) | CUTANEOUS | Status: DC | PRN
  Filled 2019-08-15: qty 5

## 2019-08-15 MED ORDER — SODIUM CHLORIDE 0.9 % IV SOLN
INTRAVENOUS | Status: DC
  Administered 2019-08-15 – 2019-08-16 (×2): via INTRAVENOUS

## 2019-08-15 MED ORDER — MAGNESIUM SULFATE 2 GM/50ML IV SOLN
2.0000 g | Freq: Once | INTRAVENOUS | Status: AC
  Administered 2019-08-15: 2 g via INTRAVENOUS
  Filled 2019-08-15: qty 50

## 2019-08-15 MED ORDER — METRONIDAZOLE IN NACL 5-0.79 MG/ML-% IV SOLN
500.0000 mg | Freq: Three times a day (TID) | INTRAVENOUS | Status: DC
  Administered 2019-08-16 – 2019-08-17 (×6): 500 mg via INTRAVENOUS
  Filled 2019-08-15 (×6): qty 100

## 2019-08-15 MED ORDER — SODIUM CHLORIDE 0.9 % IV SOLN
500.0000 mg | Freq: Once | INTRAVENOUS | Status: AC
  Administered 2019-08-15: 20:00:00 500 mg via INTRAVENOUS
  Filled 2019-08-15: qty 500

## 2019-08-15 MED ORDER — FAMOTIDINE 20 MG PO TABS
20.0000 mg | ORAL_TABLET | Freq: Two times a day (BID) | ORAL | Status: DC
  Administered 2019-08-15 – 2019-08-18 (×6): 20 mg via ORAL
  Filled 2019-08-15 (×6): qty 1

## 2019-08-15 MED ORDER — LORAZEPAM 2 MG/ML IJ SOLN: 0.0000 mg | Freq: Four times a day (QID) | INTRAMUSCULAR | Status: AC

## 2019-08-15 MED ORDER — THIAMINE HCL 100 MG/ML IJ SOLN
100.0000 mg | Freq: Every day | INTRAMUSCULAR | Status: DC
  Administered 2019-08-18: 100 mg via INTRAVENOUS
  Filled 2019-08-15: qty 2

## 2019-08-15 MED ORDER — MORPHINE SULFATE (PF) 2 MG/ML IV SOLN: 2.0000 mg | INTRAVENOUS | Status: DC | PRN

## 2019-08-15 MED ORDER — OLANZAPINE 5 MG PO TABS
20.0000 mg | ORAL_TABLET | Freq: Every day | ORAL | Status: DC
  Administered 2019-08-16 – 2019-08-18 (×3): 20 mg via ORAL
  Filled 2019-08-15: qty 2
  Filled 2019-08-15 (×2): qty 4

## 2019-08-15 MED ORDER — LORAZEPAM 2 MG/ML IJ SOLN: 1.0000 mg | INTRAMUSCULAR | Status: DC | PRN

## 2019-08-15 MED ORDER — ACETAMINOPHEN 325 MG PO TABS
650.0000 mg | ORAL_TABLET | Freq: Once | ORAL | Status: AC
  Administered 2019-08-15: 650 mg via ORAL
  Filled 2019-08-15: qty 2

## 2019-08-15 MED ORDER — GABAPENTIN 300 MG PO CAPS
300.0000 mg | ORAL_CAPSULE | Freq: Three times a day (TID) | ORAL | Status: DC
  Administered 2019-08-15 – 2019-08-18 (×8): 300 mg via ORAL
  Filled 2019-08-15 (×9): qty 1

## 2019-08-15 MED ORDER — DM-GUAIFENESIN ER 30-600 MG PO TB12: 1.0000 | ORAL_TABLET | Freq: Two times a day (BID) | ORAL | Status: DC | PRN

## 2019-08-15 MED ORDER — MAGNESIUM OXIDE 400 (241.3 MG) MG PO TABS
400.0000 mg | ORAL_TABLET | Freq: Every day | ORAL | Status: DC
  Administered 2019-08-15 – 2019-08-16 (×2): 400 mg via ORAL
  Filled 2019-08-15 (×2): qty 1

## 2019-08-15 MED ORDER — ACETAMINOPHEN 325 MG PO TABS
650.0000 mg | ORAL_TABLET | Freq: Four times a day (QID) | ORAL | Status: DC | PRN
  Administered 2019-08-15 – 2019-08-17 (×2): 650 mg via ORAL
  Filled 2019-08-15 (×2): qty 2

## 2019-08-15 MED ORDER — LEVALBUTEROL HCL 0.63 MG/3ML IN NEBU: 0.6300 mg | INHALATION_SOLUTION | Freq: Four times a day (QID) | RESPIRATORY_TRACT | Status: DC | PRN

## 2019-08-15 MED ORDER — POTASSIUM CHLORIDE CRYS ER 20 MEQ PO TBCR
40.0000 meq | EXTENDED_RELEASE_TABLET | Freq: Once | ORAL | Status: AC
  Administered 2019-08-15: 40 meq via ORAL
  Filled 2019-08-15: qty 2

## 2019-08-15 MED ORDER — MIRTAZAPINE 15 MG PO TABS
15.0000 mg | ORAL_TABLET | Freq: Every day | ORAL | Status: DC
  Administered 2019-08-15 – 2019-08-17 (×3): 15 mg via ORAL
  Filled 2019-08-15 (×4): qty 1

## 2019-08-15 MED ORDER — NICOTINE 21 MG/24HR TD PT24
21.0000 mg | MEDICATED_PATCH | Freq: Every day | TRANSDERMAL | Status: DC
  Administered 2019-08-15 – 2019-08-18 (×4): 21 mg via TRANSDERMAL
  Filled 2019-08-15 (×4): qty 1

## 2019-08-15 MED ORDER — CIPROFLOXACIN IN D5W 400 MG/200ML IV SOLN
400.0000 mg | Freq: Two times a day (BID) | INTRAVENOUS | Status: DC
  Administered 2019-08-15 – 2019-08-17 (×4): 400 mg via INTRAVENOUS
  Filled 2019-08-15 (×4): qty 200

## 2019-08-15 MED ORDER — ONDANSETRON HCL 4 MG/2ML IJ SOLN: 4.0000 mg | Freq: Three times a day (TID) | INTRAMUSCULAR | Status: DC | PRN

## 2019-08-15 MED ORDER — POTASSIUM CHLORIDE 10 MEQ/100ML IV SOLN
10.0000 meq | INTRAVENOUS | Status: AC
  Administered 2019-08-15 (×2): 10 meq via INTRAVENOUS
  Filled 2019-08-15 (×2): qty 100

## 2019-08-15 MED ORDER — SODIUM CHLORIDE 0.9 % IV BOLUS
1000.0000 mL | Freq: Once | INTRAVENOUS | Status: AC
  Administered 2019-08-15: 1000 mL via INTRAVENOUS

## 2019-08-15 MED ORDER — CARVEDILOL 12.5 MG PO TABS
12.5000 mg | ORAL_TABLET | Freq: Two times a day (BID) | ORAL | Status: DC
  Administered 2019-08-16 – 2019-08-18 (×5): 12.5 mg via ORAL
  Filled 2019-08-15 (×6): qty 1

## 2019-08-15 MED ORDER — PANTOPRAZOLE SODIUM 40 MG PO TBEC
40.0000 mg | DELAYED_RELEASE_TABLET | Freq: Two times a day (BID) | ORAL | Status: DC
  Administered 2019-08-15 – 2019-08-18 (×6): 40 mg via ORAL
  Filled 2019-08-15 (×6): qty 1

## 2019-08-15 MED ORDER — IOHEXOL 300 MG/ML  SOLN
100.0000 mL | Freq: Once | INTRAMUSCULAR | Status: AC | PRN
  Administered 2019-08-15: 18:00:00 100 mL via INTRAVENOUS

## 2019-08-15 MED ORDER — POTASSIUM CHLORIDE CRYS ER 10 MEQ PO TBCR
10.0000 meq | EXTENDED_RELEASE_TABLET | Freq: Once | ORAL | Status: AC
  Administered 2019-08-15: 16:00:00 10 meq via ORAL
  Filled 2019-08-15: qty 1

## 2019-08-15 MED ORDER — HYDRALAZINE HCL 25 MG PO TABS: 25.0000 mg | ORAL_TABLET | Freq: Three times a day (TID) | ORAL | Status: DC | PRN

## 2019-08-15 MED ORDER — ADULT MULTIVITAMIN W/MINERALS CH
1.0000 | ORAL_TABLET | Freq: Every day | ORAL | Status: DC
  Administered 2019-08-15 – 2019-08-18 (×4): 1 via ORAL
  Filled 2019-08-15 (×4): qty 1

## 2019-08-15 MED ORDER — LORAZEPAM 1 MG PO TABS
1.0000 mg | ORAL_TABLET | ORAL | Status: DC | PRN
  Administered 2019-08-17: 1 mg via ORAL
  Filled 2019-08-15: qty 1

## 2019-08-15 MED ORDER — IPRATROPIUM BROMIDE 0.02 % IN SOLN
2.5000 mL | RESPIRATORY_TRACT | Status: DC
  Administered 2019-08-15: 0.5 mg via RESPIRATORY_TRACT
  Filled 2019-08-15: qty 2.5

## 2019-08-15 MED ORDER — LORAZEPAM 2 MG/ML IJ SOLN: 0.0000 mg | Freq: Two times a day (BID) | INTRAMUSCULAR | Status: DC

## 2019-08-15 NOTE — ED Notes (Signed)
Bedside commode placed in pt's room
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
## 2019-08-15 NOTE — ED Triage Notes (Signed)
Patient states he was here in the ED 3 days ago for sob and fever was tested for covid and it came back negative. States today he was using his inhaler and it wasn't working , states he took tylenol earlier and started sweating. C/o headache and diarrhea x 3 days.

## 2019-08-15 NOTE — ED Notes (Signed)
Patient transported to CT 

## 2019-08-15 NOTE — ED Provider Notes (Addendum)
4:38 PM signout from [employer] at shift change.  Patient here with continued fever, body aches, shortness of breath, diarrhea, headache.  This is patient's second ED visit for the same.  White blood cell count appears normal.  Patient is being treated with antipyretics and IV fluids.  Currently pending CT imaging of the abdomen to rule out intra-abdominal source.   BP 121/75   Pulse (!) 107   Temp 99.4 F (37.4 C) (Oral)   Resp 18   Ht 5' 10"  (1.778 m)   Wt 104.3 kg   SpO2 99%   BMI 33.00 kg/m   7:36 PM CT reviewed.  Patient has signs of transverse and splenic flexure colitis.  Repeat Covid testing negative.    I went and talked with the patient.  He continues to be tachycardic even though he is now afebrile.  He has had 3 large volume watery stools.  He states that he has seen blood in the stool at times.  He denies any recent antibiotic use, travel, suspicious food or water exposures.  He does report several family members who had diarrhea and one who had a fever after having a meal with prior to onset of symptoms.  He cannot remember exactly what was eaten, however he does remember eating chicken.  Patient has fever to 103.5 F at home, hypokalemia, mild elevation in creatinine, persistent tachycardia, continued large volume watery stool.  GI pathogen panel is pending.  I added on C. difficile testing.  Will start on IV azithromycin and continue hydration.  We will continue repletion of potassium and check magnesium.  We will admit patient to the hospital.  Patient is in agreement.  BP 130/80   Pulse (!) 110   Temp 99.4 F (37.4 C) (Oral)   Resp 17   Ht 5' 10"  (1.778 m)   Wt 104.3 kg   SpO2 100%   BMI 33.00 kg/m   8:07 PM Mag is low -- repletion ordered.  8:23 PM Discussed with Dr. Blaine Hamper of [facility] who will see patient.   BP 128/84   Pulse (!) 109   Temp 99.4 F (37.4 C) (Oral)   Resp 16   Ht 5' 10"  (1.778 m)   Wt 104.3 kg   SpO2 100%   BMI 33.00 kg/m       Carlisle Cater, PA-C 08/15/19 2023    Sherwood Gambler, MD 08/16/19 [phone]
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
## 2019-08-15 NOTE — Progress Notes (Signed)
Pharmacy Antibiotic Note  Jacob Adams is a 53 y.o. male admitted on 08/15/2019 with intra-abdominal.  Pharmacy has been consulted for Cipro dosing. Flagyl per MD.   Plan: Cipro 440m IV every 12 hours Monitor renal function, clinical status, and culture results   Height: 5' 10"  (177.8 cm) Weight: 230 lb (104.3 kg) IBW/kg (Calculated) : 73  Temp (24hrs), Avg:100.9 F (38.3 C), Min:99.4 F (37.4 C), Max:102 F (38.9 C)  Recent Labs  Lab 08/13/19 1750 08/13/19 2044 08/13/19 2238 08/15/19 1220 08/15/19 1420  WBC 8.6  --   --  10.3  --   CREATININE 0.98  --   --  1.32*  --   LATICACIDVEN  --  2.1* 2.2*  --  1.6    Estimated Creatinine Clearance: 77.4 mL/min (A) (by C-G formula based on SCr of 1.32 mg/dL (H)).    Allergies  Allergen Reactions  . Uncoded Nonscreenable Allergen Swelling and Other (See Comments)    Sun tan lotion: swelling and peeling  . Acetaminophen Other (See Comments)    Make patient's side hurt- stated he has liver damage  . Ibuprofen Nausea Only, Swelling and Other (See Comments)    "Discomfort of stomach," also  . Nsaids Swelling and Other (See Comments)    "Discomfort of stomach," also  . Penicillins Itching, Swelling and Rash    Has patient had a PCN reaction causing immediate rash, facial/tongue/throat swelling, SOB or lightheadedness with hypotension: yes Has patient had a PCN reaction causing severe rash involving mucus membranes or skin necrosis: no Has patient had a PCN reaction that required hospitalization yes, happened while hospitalized Has patient had a PCN reaction occurring within the last 10 years: yes If all of the above answers are "NO", then may proceed with Cephalosporin use.   . [name] Other (See Comments)    DOESN'T EAT PORK- patient preference    Antimicrobials this admission: Cipro 10/26 >> Flagyl 10/26 >>  Dose adjustments this admission:   Microbiology results: 10/26 BCx >> 10/26 UCx >> 10/26 Cdiff  negative 10/26 COVID negative 10/26 GI panel >>  Thank you for allowing pharmacy to be a part of this patient's care.  JSloan Leiter PharmD, BCPS, BCCCP Clinical Pharmacist Clinical phone 08/15/2019 until 10:30P [phone] refer to ASah[facility] Center Ltdfor MDumontnumbers  08/15/2019 8:41 PM

## 2019-08-16 ENCOUNTER — Other Ambulatory Visit: Payer: Self-pay

## 2019-08-16 ENCOUNTER — Encounter (HOSPITAL_COMMUNITY): Payer: Self-pay | Admitting: General Practice

## 2019-08-16 DIAGNOSIS — I5022 Chronic systolic (congestive) heart failure: Secondary | ICD-10-CM | POA: Diagnosis not present

## 2019-08-16 DIAGNOSIS — I10 Essential (primary) hypertension: Secondary | ICD-10-CM

## 2019-08-16 DIAGNOSIS — N179 Acute kidney failure, unspecified: Secondary | ICD-10-CM | POA: Diagnosis not present

## 2019-08-16 DIAGNOSIS — R197 Diarrhea, unspecified: Secondary | ICD-10-CM | POA: Diagnosis not present

## 2019-08-16 DIAGNOSIS — K529 Noninfective gastroenteritis and colitis, unspecified: Secondary | ICD-10-CM | POA: Diagnosis not present

## 2019-08-16 LAB — TROPONIN I (HIGH SENSITIVITY)
Troponin I (High Sensitivity): 17 ng/L (ref <18)
Troponin I (High Sensitivity): 15 ng/L (ref <18)

## 2019-08-16 LAB — BASIC METABOLIC PANEL
Anion gap: 9 (ref 5–15)
BUN: 7 mg/dL (ref 6–20)
CO2: 17 mmol/L — ABNORMAL LOW (ref 22–32)
Calcium: 7.9 mg/dL — ABNORMAL LOW (ref 8.9–10.3)
Chloride: 111 mmol/L (ref 98–111)
Creatinine, Ser: 1.02 mg/dL (ref 0.61–1.24)
GFR calc Af Amer: >60 mL/min (ref >60)
GFR calc non Af Amer: >60 mL/min (ref >60)
Glucose, Bld: 108 mg/dL — ABNORMAL HIGH (ref 70–99)
Potassium: 3.6 mmol/L (ref 3.5–5.1)
Sodium: 137 mmol/L (ref 135–145)

## 2019-08-16 LAB — LIPID PANEL
Cholesterol: 127 mg/dL (ref 0–200)
HDL: 48 mg/dL (ref >40)
LDL Cholesterol: 63 mg/dL (ref 0–99)
Total CHOL/HDL Ratio: 2.6 RATIO
Triglycerides: 81 mg/dL (ref <150)
VLDL: 16 mg/dL (ref 0–40)

## 2019-08-16 LAB — CBC
HCT: 28.6 % — ABNORMAL LOW (ref 39.0–52.0)
Hemoglobin: 8.7 g/dL — ABNORMAL LOW (ref 13.0–17.0)
MCH: 28.2 pg (ref 26.0–34.0)
MCHC: 30.4 g/dL (ref 30.0–36.0)
MCV: 92.9 fL (ref 80.0–100.0)
Platelets: 284 10*3/uL (ref 150–400)
RBC: 3.08 MIL/uL — ABNORMAL LOW (ref 4.22–5.81)
RDW: 17.3 % — ABNORMAL HIGH (ref 11.5–15.5)
WBC: 6.5 10*3/uL (ref 4.0–10.5)
nRBC: 0 % (ref 0.0–0.2)

## 2019-08-16 LAB — URINE CULTURE

## 2019-08-16 LAB — LACTOFERRIN, FECAL, QUALITATIVE: Lactoferrin, Fecal, Qual: POSITIVE — AB

## 2019-08-16 LAB — HEMOGLOBIN A1C
Hgb A1c MFr Bld: 6 % — ABNORMAL HIGH (ref 4.8–5.6)
Mean Plasma Glucose: 125.5 mg/dL

## 2019-08-16 LAB — MAGNESIUM: Magnesium: 2.4 mg/dL (ref 1.7–2.4)

## 2019-08-16 MED ORDER — INFLUENZA VAC SPLIT QUAD 0.5 ML IM SUSY
0.5000 mL | PREFILLED_SYRINGE | INTRAMUSCULAR | Status: AC
  Administered 2019-08-17: 0.5 mL via INTRAMUSCULAR
  Filled 2019-08-16: qty 0.5

## 2019-08-16 MED ORDER — IPRATROPIUM BROMIDE 0.02 % IN SOLN: 2.5000 mL | Freq: Four times a day (QID) | RESPIRATORY_TRACT | Status: DC | PRN

## 2019-08-16 MED ORDER — LEVOTHYROXINE SODIUM 88 MCG PO TABS
88.0000 ug | ORAL_TABLET | Freq: Every day | ORAL | Status: DC
  Administered 2019-08-17: 88 ug via ORAL
  Filled 2019-08-16 (×2): qty 1

## 2019-08-16 NOTE — Plan of Care (Signed)
  Problem: Health Behavior/Discharge Planning: Goal: Ability to manage health-related needs will improve Outcome: Progressing   

## 2019-08-16 NOTE — ED Notes (Signed)
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 17 20   Temp:      TempSrc:      SpO2:  99% 99% 98%  Weight:      Height:      PainSc: Asleep       Isolation Precautions Enteric precautions (UV disinfection)  Medications Medications  0.9 %  sodium chloride infusion ( Intravenous Stopped 08/16/19 0557)  magnesium oxide (MAG-OX) tablet 400 mg (400 mg Oral Given 08/16/19 0930)   acetaminophen (TYLENOL) tablet 650 mg (650 mg Oral Given 08/15/19 2118)  ondansetron (ZOFRAN) injection 4 mg (has no administration in time range)  metroNIDAZOLE (FLAGYL) IVPB 500 mg (500 mg Intravenous New Bag/Given 08/16/19 1422)  levalbuterol (XOPENEX) nebulizer solution 0.63 mg (has no administration in time range)  dextromethorphan-guaiFENesin (MUCINEX DM) 30-600 MG per 12 hr tablet 1 tablet (has no administration in time range)  nicotine (NICODERM CQ - dosed in mg/24 hours) patch 21 mg (21 mg Transdermal Patch Applied 08/16/19 0929)  LORazepam (ATIVAN) tablet 1-4 mg (has no administration in time range)    Or  LORazepam (ATIVAN) injection 1-4 mg (has no administration in time range)  thiamine (VITAMIN B-1) tablet 100 mg (100 mg Oral Given 08/16/19 0930)    Or  thiamine (B-1) injection 100 mg ( Intravenous See Alternative 43/32/95 1884)  folic acid (FOLVITE) tablet 1 mg (1 mg Oral Given 08/16/19 0930)  multivitamin with minerals tablet 1 tablet (1 tablet Oral Given 08/16/19 0931)  LORazepam (ATIVAN) injection 0-4 mg (0 mg Intravenous Not Given 08/16/19 1443)    Followed by  LORazepam (ATIVAN) injection 0-4 mg (has no administration in time range)  nitroGLYCERIN (NITROSTAT) SL tablet 0.4 mg (has no administration in time range)  morphine 2 MG/ML injection 2 mg (has no administration in time range)  ciprofloxacin (CIPRO) IVPB 400 mg ( Intravenous Stopped 08/16/19 1028)  hydrALAZINE (APRESOLINE) tablet 25 mg (has no administration in time range)  atorvastatin (LIPITOR) tablet 40 mg (40 mg Oral Given 08/16/19 0931)  carvedilol (COREG) tablet 12.5 mg (12.5 mg Oral Given 08/16/19 0929)  DULoxetine (CYMBALTA) DR capsule 30 mg (30 mg Oral Given 08/16/19 0930)  mirtazapine (REMERON) tablet 15 mg (15 mg Oral Given 08/15/19 2345)  OLANZapine (ZYPREXA) tablet 20 mg (20 mg Oral Given 08/16/19 0933)  famotidine (PEPCID) tablet 20 mg (20 mg Oral Given 08/16/19 0931)  pantoprazole (PROTONIX) EC  tablet 40 mg (40 mg Oral Given 08/16/19 0930)  gabapentin (NEURONTIN) capsule 300 mg (300 mg Oral Given 08/16/19 0930)  lidocaine-prilocaine (EMLA) cream 1 application (has no administration in time range)  ipratropium (ATROVENT) nebulizer solution 0.5 mg (has no administration in time range)  acetaminophen (TYLENOL) tablet 650 mg (650 mg Oral Given 08/15/19 1256)  sodium chloride 0.9 % bolus 1,000 mL (0 mLs Intravenous Stopped 08/15/19 1650)  potassium chloride (KLOR-CON) CR tablet 10 mEq (10 mEq Oral Given 08/15/19 1535)  sodium chloride 0.9 % bolus 1,000 mL (0 mLs Intravenous Stopped 08/15/19 2120)  potassium chloride SA (  KLOR-CON) CR tablet 40 mEq (40 mEq Oral Given 08/15/19 1651)  iohexol (OMNIPAQUE) 300 MG/ML solution 100 mL (100 mLs Intravenous Contrast Given 08/15/19 1807)  azithromycin (ZITHROMAX) 500 mg in sodium chloride 0.9 % 250 mL IVPB (0 mg Intravenous Stopped 08/15/19 2111)  potassium chloride 10 mEq in 100 mL IVPB (0 mEq Intravenous Stopped 08/15/19 2344)  magnesium sulfate IVPB 2 g 50 mL (0 g Intravenous Stopped 08/16/19 0050)  potassium chloride SA (KLOR-CON) CR tablet 40 mEq (40 mEq Oral Given 08/15/19 2119)    Mobility walks Low fall risk   Focused Assessments    R Recommendations: See Admitting Provider Note  Report given to:   Additional Notes:

## 2019-08-16 NOTE — Progress Notes (Signed)
New Admission Note:   Arrival Method: Bed  Mental Orientation: alert  Telemetry: Box 20  Assessment: Completed Skin: Intact  HC:WCBJ hand and right wrist  Pain: 0/10  Tubes: Safety Measures: Safety Fall Prevention Plan has been given, discussed and signed Admission: Completed [facility] Orientation: Patient has been orientated to the room, unit and staff.  Family: none   Orders have been reviewed and implemented. Will continue to monitor the patient. Call light has been placed within reach and bed alarm has been activated.   Vihan Santagata RN [facility] Phone: [phone]

## 2019-08-16 NOTE — Progress Notes (Signed)
PROGRESS NOTE    Jacob Adams  ZYS:[id number] DOB: Apr 08, 1966 DOA: 08/15/2019 PCP: System, Pcp Not In      Brief Narrative:  Jacob Adams is a 53 y.o. M with hx sCHF EF 25%, NICM, HTN, hypothyroidism, Alcohol use disorder, OSA not on CPAP, pancreatitis, PUD, anemia and smoking who presented with progressive bloody diarrhea for 4 days.  In the ER tachycardic, hemoglobin 9.910 from baseline 13, fever to 102F, CT abdomen pelvis showed colitis.  Started on IV fluids and empiric antibiotics for infectious colitis and admitted.     Assessment & Plan:  Sepsis due to acute infectious colitis Febrile, tachycardic, with acute kidney injury.  CT showed diffuse wall thickening of the splenic flexure and transverse colon.  Several family members also with diarrhea, dysentery.  C. difficile negative.  Parent positive -Follow GI pathogen panel -Continue Cipro Flagyl   Acute blood loss anemia From colitis.  Hemoglobin slightly lower this morning 8.7, no significant ongoing bleeding in the ER. -Trend hemoglobin -Consult GI  AKI Baseline creatinine 0.9-1, presented with creatinine 1.3 Resolved with fluids -Hold losartan  Hypokalemia Hypomagnesemia Repleted  Chronic systolic CHF Appears euvolemic at present.  On spiro not Lasix at baseline -Continue beta-blocker -Hold losartan, spironolactone  Hypertension Blood pressure low normal -Continue atorvastatin, carvedilol  Hypothyroidism -Resume levothyroxine  Alcohol use Previous history of complicated alcohol withdrawal -CIWA protocol with on-demand lorazepam -Thiamine and folate  Elevated troponin  Smoking Cessation recommended  Prediabetes  Other medications -Continue gabapentin, duloxetine, Pepcid, Remeron, Zyprexa, Protonix      MDM and disposition: The below labs and imaging reports were reviewed and summarized above.  Medication management as above.  The patient was admitted with colitis.    His HR this AM is still  >100.  Will need ongoing IV antibiotics and fluids.     DVT prophylaxis: Lovenox Code Status: FULL Family Communication:     Consultants:     Procedures:     Antimicrobials:   Ciprofloxacin  Flagyl    Subjective: Still has some crampy pain, no vomiting, no confusion, still some blood in stools.  Objective: Vitals:   08/16/19 0700 08/16/19 0921 08/16/19 1400 08/16/19 1547  BP: 111/78 124/80 110/72 111/80  Pulse: 90 98 85 81  Resp: 15 17 20 20   Temp:    99.3 F (37.4 C)  TempSrc:    Oral  SpO2: 99% 99% 98% 98%  Weight:      Height:        Intake/Output Summary (Last 24 hours) at 08/16/2019 1931 Last data filed at 08/16/2019 1900 Gross per 24 hour  Intake 2019.97 ml  Output --  Net 2019.97 ml   Filed Weights   08/15/19 1258  Weight: 104.3 kg    Examination: General appearance:  adult male, alert and in no acute distress.   HEENT: Anicteric, conjunctiva pink, lids and lashes normal. No nasal deformity, discharge, epistaxis.  Lips moist, dentition normal, oropharynx moist, no oral lesions.   Skin: Warm and dry.  No jaundice.  No suspicious rashes or lesions. Cardiac: RRR, nl S1-S2, no murmurs appreciated.  Capillary refill is brisk.  JVP normal.  No LE edema.  Radia  pulses 2+ and symmetric. Respiratory: Normal respiratory rate and rhythm.  CTAB without rales or wheezes. Abdomen: Abdomen soft.  Moderate nonfocal TTP with guarding. No ascites, distension, hepatosplenomegaly.   MSK: No deformities or effusions. Neuro: Awake and alert.  EOMI, moves all extremities. Speech fluent.    Psych: Sensorium intact  and responding to questions, attention normal. Affect normal.  Judgment and insight appear normal.    Data Reviewed: I have personally reviewed following labs and imaging studies:  CBC: Recent Labs  Lab 08/13/19 1750 08/15/19 1220 08/15/19 2238 08/16/19 0506  WBC 8.6 10.3 8.6 6.5  NEUTROABS  --  8.2* 6.6  --   HGB 9.9* 9.6* 9.3* 8.7*  HCT  33.2* 30.8* 29.6* 28.6*  MCV 97.4 89.3 89.2 92.9  PLT 159 344 304 284   Basic Metabolic Panel: Recent Labs  Lab 08/13/19 1750 08/15/19 1220 08/15/19 1937 08/16/19 0506  NA 140 135  --  137  K 3.7 2.9*  --  3.6  CL 108 106  --  111  CO2 17* 16*  --  17*  GLUCOSE 103* 144*  --  108*  BUN 6 8  --  7  CREATININE 0.98 1.32*  --  1.02  CALCIUM 8.9 8.4*  --  7.9*  MG  --   --  1.5* 2.4   GFR: Estimated Creatinine Clearance: 100.1 mL/min (by C-G formula based on SCr of 1.02 mg/dL). Liver Function Tests: Recent Labs  Lab 08/15/19 1220  AST 29  ALT 20  ALKPHOS 52  BILITOT 0.5  PROT 7.4  ALBUMIN 3.3*   Recent Labs  Lab 08/15/19 1220  LIPASE 35   No results for input(s): AMMONIA in the last 168 hours. Coagulation Profile: Recent Labs  Lab 08/15/19 2238  INR 1.1   Cardiac Enzymes: No results for input(s): CKTOTAL, CKMB, CKMBINDEX, TROPONINI in the last 168 hours. BNP (last 3 results) No results for input(s): PROBNP in the last 8760 hours. HbA1C: Recent Labs    08/16/19 0500  HGBA1C 6.0*   CBG: No results for input(s): GLUCAP in the last 168 hours. Lipid Profile: Recent Labs    08/16/19 0500  CHOL 127  HDL 48  LDLCALC 63  TRIG 81  CHOLHDL 2.6   Thyroid Function Tests: No results for input(s): TSH, T4TOTAL, FREET4, T3FREE, THYROIDAB in the last 72 hours. Anemia Panel: No results for input(s): VITAMINB12, FOLATE, FERRITIN, TIBC, IRON, RETICCTPCT in the last 72 hours. Urine analysis:    Component Value Date/Time   COLORURINE YELLOW 08/15/2019 1420   APPEARANCEUR CLEAR 08/15/2019 1420   LABSPEC 1.025 08/15/2019 1420   PHURINE 5.5 08/15/2019 1420   GLUCOSEU NEGATIVE 08/15/2019 1420   HGBUR MODERATE (A) 08/15/2019 1420   BILIRUBINUR NEGATIVE 08/15/2019 1420   KETONESUR NEGATIVE 08/15/2019 1420   PROTEINUR 100 (A) 08/15/2019 1420   UROBILINOGEN 0.2 08/25/2013 2023   NITRITE NEGATIVE 08/15/2019 1420   LEUKOCYTESUR NEGATIVE 08/15/2019 1420   Sepsis  Labs: @LABRCNTIP (procalcitonin:4,lacticacidven:4)  ) Recent Results (from the past 240 hour(s))  SARS CORONAVIRUS 2 (TAT 6-24 HRS) Nasopharyngeal Nasopharyngeal Swab     Status: None   Collection Time: 08/13/19 10:38 PM   Specimen: Nasopharyngeal Swab  Result Value Ref Range Status   SARS Coronavirus 2 NEGATIVE NEGATIVE Final    Comment: (NOTE) SARS-CoV-2 target nucleic acids are NOT DETECTED. The SARS-CoV-2 RNA is generally detectable in upper and lower respiratory specimens during the acute phase of infection. Negative results do not preclude SARS-CoV-2 infection, do not rule out co-infections with other pathogens, and should not be used as the sole basis for treatment or other patient management decisions. Negative results must be combined with clinical observations, patient history, and epidemiological information. The expected result is Negative. Fact Sheet for Patients: HairSlick.no Fact Sheet for Healthcare Providers: quierodirigir.com This test is not  yet approved or cleared by the [place] and  has been authorized for detection and/or diagnosis of SARS-CoV-2 by FDA under an Emergency Use Authorization (EUA). This EUA will remain  in effect (meaning this test can be used) for the duration of the COVID-19 declaration under Section 56 4(b)(1) of the Act, 21 U.S.C. section 360bbb-3(b)(1), unless the authorization is terminated or revoked sooner. Performed at [facility], [address], [place], [place] [address]   Blood Culture (routine x 2)     Status: None (Preliminary result)   Collection Time: 08/15/19 11:37 AM   Specimen: BLOOD RIGHT WRIST  Result Value Ref Range Status   Specimen Description BLOOD RIGHT WRIST  Final   Special Requests   Final    BOTTLES DRAWN AEROBIC AND ANAEROBIC Blood Culture adequate volume   Culture   Final    NO GROWTH < 24 HOURS Performed at [facility], [address], [place], [place] [address]    Report Status PENDING  Incomplete  SARS CORONAVIRUS 2 (TAT 6-24 HRS) Nasopharyngeal Nasopharyngeal Swab     Status: None   Collection Time: 08/15/19 12:22 PM   Specimen: Nasopharyngeal Swab  Result Value Ref Range Status   SARS Coronavirus 2 NEGATIVE NEGATIVE Final    Comment: (NOTE) SARS-CoV-2 target nucleic acids are NOT DETECTED. The SARS-CoV-2 RNA is generally detectable in upper and lower respiratory specimens during the acute phase of infection. Negative results do not preclude SARS-CoV-2 infection, do not rule out co-infections with other pathogens, and should not be used as the sole basis for treatment or other patient management decisions. Negative results must be combined with clinical observations, patient history, and epidemiological information. The expected result is Negative. Fact Sheet for Patients: HairSlick.no Fact Sheet for Healthcare Providers: quierodirigir.com This test is not yet approved or cleared by the [place] FDA and  has been authorized for detection and/or diagnosis of SARS-CoV-2 by FDA under an Emergency Use Authorization (EUA). This EUA will remain  in effect (meaning this test can be used) for the duration of the COVID-19 declaration under Section 56 4(b)(1) of the Act, 21 U.S.C. section 360bbb-3(b)(1), unless the authorization is terminated or revoked sooner. Performed at [facility], [address], [place], [place] [address]   Blood Culture (routine x 2)     Status: None (Preliminary result)   Collection Time: 08/15/19  1:15 PM   Specimen: BLOOD LEFT FOREARM  Result Value Ref Range Status   Specimen Description BLOOD LEFT FOREARM  Final   Special Requests   Final    BOTTLES DRAWN AEROBIC AND ANAEROBIC Blood Culture adequate volume   Culture   Final    NO GROWTH < 24 HOURS Performed at [facility], [address] [place],  [place] [address]    Report Status PENDING  Incomplete  Urine culture     Status: Abnormal   Collection Time: 08/15/19  2:20 PM   Specimen: In/Out Cath Urine  Result Value Ref Range Status   Specimen Description IN/OUT CATH URINE  Final   Special Requests   Final    NONE Performed at [facility], [address], [place], [place] [address]    Culture MULTIPLE SPECIES PRESENT, SUGGEST RECOLLECTION (A)  Final   Report Status 08/16/2019 FINAL  Final  C difficile quick scan w PCR reflex     Status: None   Collection Time: 08/15/19  7:30 PM   Specimen:  Stool  Result Value Ref Range Status   C Diff antigen NEGATIVE NEGATIVE Final   C Diff toxin NEGATIVE NEGATIVE Final   C Diff interpretation No C. difficile detected.  Final    Comment: Performed at [facility], [address], [place], [place] [address]         Radiology Studies: Ct Abdomen Pelvis W Contrast  Result Date: 08/15/2019 CLINICAL DATA:  Abdominal pain EXAM: CT ABDOMEN AND PELVIS WITH CONTRAST TECHNIQUE: Multidetector CT imaging of the abdomen and pelvis was performed using the standard protocol following bolus administration of intravenous contrast. CONTRAST:  OMNIPAQUE IOHEXOL 300 MG/ML  SOLN COMPARISON:  October 31, 2015 FINDINGS: Lower chest: The lung bases are clear. The heart size is normal. Hepatobiliary: The liver is normal. Normal gallbladder.There is no biliary ductal dilation. Pancreas: Normal contours without ductal dilatation. No peripancreatic fluid collection. Spleen: No splenic laceration or hematoma. Adrenals/Urinary Tract: --Adrenal glands: No adrenal hemorrhage. --Right kidney/ureter: No hydronephrosis or perinephric hematoma. --Left kidney/ureter: No hydronephrosis or perinephric hematoma. --Urinary bladder: Unremarkable. Stomach/Bowel: --Stomach/Duodenum: No hiatal hernia or other gastric abnormality. Normal duodenal course and caliber. --Small bowel: No dilatation or inflammation. --Colon: There is  diffuse wall thickening of the splenic flexure of the colon and portions of the transverse colon. --Appendix: Normal. Vascular/Lymphatic: Normal course and caliber of the major abdominal vessels. --No retroperitoneal lymphadenopathy. --No mesenteric lymphadenopathy. --No pelvic or inguinal lymphadenopathy. Reproductive: Unremarkable Other: No ascites or free air. The abdominal wall is normal. Musculoskeletal. No acute displaced fractures. IMPRESSION: Diffuse wall thickening of the splenic flexure of the colon and portions of the transverse colon, consistent with colitis, likely infectious or inflammatory. Ischemic colitis seems less likely given the relative lack of vascular disease within the abdomen. Electronically Signed   By: Katherine Mantle M.D.   On: 08/15/2019 19:12   Dg Chest Port 1 View  Result Date: 08/15/2019 CLINICAL DATA:  Shortness of breath and fever EXAM: PORTABLE CHEST 1 VIEW COMPARISON:  August 13, 2019 FINDINGS: There are small granulomas in the left base. There is no edema or consolidation. Heart size and pulmonary vascularity are normal. No adenopathy. No bone lesions. IMPRESSION: Small granulomas left base. No edema or consolidation. Stable cardiac silhouette. Electronically Signed   By: Bretta Bang III M.D.   On: 08/15/2019 13:10        Scheduled Meds:  atorvastatin  40 mg Oral Daily   carvedilol  12.5 mg Oral BID WC   DULoxetine  30 mg Oral Daily   famotidine  20 mg Oral BID   folic acid  1 mg Oral Daily   gabapentin  300 mg Oral TID   [START ON 08/17/2019] influenza vac split quadrivalent PF  0.5 mL Intramuscular Tomorrow-1000   LORazepam  0-4 mg Intravenous Q6H   Followed by   Melene Muller ON 08/17/2019] LORazepam  0-4 mg Intravenous Q12H   magnesium oxide  400 mg Oral Daily   mirtazapine  15 mg Oral QHS   multivitamin with minerals  1 tablet Oral Daily   nicotine  21 mg Transdermal Daily   OLANZapine  20 mg Oral Daily   pantoprazole  40 mg  Oral BID   thiamine  100 mg Oral Daily   Or   thiamine  100 mg Intravenous Daily   Continuous Infusions:  sodium chloride 75 mL/hr at 08/16/19 1922   ciprofloxacin Stopped (08/16/19 1028)   metronidazole Stopped (08/16/19 1545)     LOS: 1 day  Time spent: 25 minutes    Alberteen Sam, MD [facility] 08/16/2019, 7:31 PM     Please page through AMION:  [email or web address] Password [email or web address] If 7PM-7AM, please contact night-coverage    Estimated body mass index is 33 kg/m as calculated from the following:   Height as of this encounter: 5\' 10"  (1.778 m).   Weight as of this encounter: 104.3 kg. Malnutrition Type:   Malnutrition Characteristics:   Nutrition Interventions:       atorvastatin  40 mg Oral Daily   carvedilol  12.5 mg Oral BID WC   DULoxetine  30 mg Oral Daily   famotidine  20 mg Oral BID   folic acid  1 mg Oral Daily   gabapentin  300 mg Oral TID   [START ON 08/17/2019] influenza vac split quadrivalent PF  0.5 mL Intramuscular Tomorrow-1000   LORazepam  0-4 mg Intravenous Q6H   Followed by   Melene Muller ON 08/17/2019] LORazepam  0-4 mg Intravenous Q12H   magnesium oxide  400 mg Oral Daily   mirtazapine  15 mg Oral QHS   multivitamin with minerals  1 tablet Oral Daily   nicotine  21 mg Transdermal Daily   OLANZapine  20 mg Oral Daily   pantoprazole  40 mg Oral BID   thiamine  100 mg Oral Daily   Or   thiamine  100 mg Intravenous Daily    sodium chloride 75 mL/hr at 08/16/19 1922   ciprofloxacin Stopped (08/16/19 1028)   metronidazole Stopped (08/16/19 1545)    Current Meds  Medication Sig   albuterol (VENTOLIN HFA) 108 (90 Base) MCG/ACT inhaler Inhale 1-2 puffs into the lungs every 4 (four) hours as needed for wheezing or shortness of breath.   amLODipine (NORVASC) 10 MG tablet Take 10 mg by mouth daily.   atorvastatin (LIPITOR) 80 MG tablet Take 40 mg by mouth daily.   carvedilol (COREG) 12.5 MG tablet Take 1  tablet (12.5 mg total) by mouth 2 (two) times daily with a meal.   DULoxetine (CYMBALTA) 30 MG capsule Take 1 capsule (30 mg total) by mouth daily.   famotidine (PEPCID) 20 MG tablet Take 1 tablet (20 mg total) by mouth 2 (two) times daily.   gabapentin (NEURONTIN) 300 MG capsule Take 300 mg by mouth 3 (three) times daily.   levothyroxine (SYNTHROID) 88 MCG tablet Take 88 mcg by mouth daily before breakfast.   lidocaine-prilocaine (EMLA) cream Apply 1 application topically 2 (two) times daily as needed. Apply to both knees two times a day AS NEEDED   losartan (COZAAR) 50 MG tablet Take 1 tablet (50 mg total) by mouth daily.   mirtazapine (REMERON) 15 MG tablet Take 15 mg by mouth at bedtime.    nicotine polacrilex (COMMIT) 4 MG lozenge Take 4 mg by mouth as needed for smoking cessation (CHEW).    OLANZapine (ZYPREXA) 20 MG tablet Take 20 mg by mouth daily.    pantoprazole (PROTONIX) 40 MG tablet Take 1 tablet (40 mg total) by mouth 2 (two) times daily.   spironolactone (ALDACTONE) 25 MG tablet Take 1 tablet (25 mg total) by mouth daily.   acetaminophen, dextromethorphan-guaiFENesin, hydrALAZINE, ipratropium, levalbuterol, lidocaine-prilocaine, LORazepam **OR** LORazepam, morphine injection, nitroGLYCERIN, ondansetron (ZOFRAN) IV

## 2019-08-16 NOTE — ED Notes (Signed)
Tele ordered bfast 

## 2019-08-16 NOTE — ED Notes (Signed)
Patient is resting comfortably. 

## 2019-08-16 NOTE — ED Notes (Signed)
Lunch Tray Ordered @ 1113.  

## 2019-08-17 ENCOUNTER — Other Ambulatory Visit: Payer: Self-pay

## 2019-08-17 DIAGNOSIS — A419 Sepsis, unspecified organism: Secondary | ICD-10-CM | POA: Diagnosis not present

## 2019-08-17 DIAGNOSIS — R197 Diarrhea, unspecified: Secondary | ICD-10-CM | POA: Diagnosis not present

## 2019-08-17 DIAGNOSIS — N179 Acute kidney failure, unspecified: Secondary | ICD-10-CM | POA: Diagnosis not present

## 2019-08-17 DIAGNOSIS — K529 Noninfective gastroenteritis and colitis, unspecified: Secondary | ICD-10-CM

## 2019-08-17 DIAGNOSIS — I5022 Chronic systolic (congestive) heart failure: Secondary | ICD-10-CM | POA: Diagnosis not present

## 2019-08-17 LAB — COMPREHENSIVE METABOLIC PANEL
ALT: 15 U/L (ref 0–44)
AST: 23 U/L (ref 15–41)
Albumin: 2.6 g/dL — ABNORMAL LOW (ref 3.5–5.0)
Alkaline Phosphatase: 45 U/L (ref 38–126)
Anion gap: 7 (ref 5–15)
BUN: 8 mg/dL (ref 6–20)
CO2: 19 mmol/L — ABNORMAL LOW (ref 22–32)
Calcium: 7.9 mg/dL — ABNORMAL LOW (ref 8.9–10.3)
Chloride: 109 mmol/L (ref 98–111)
Creatinine, Ser: 0.96 mg/dL (ref 0.61–1.24)
GFR calc Af Amer: >60 mL/min (ref >60)
GFR calc non Af Amer: >60 mL/min (ref >60)
Glucose, Bld: 111 mg/dL — ABNORMAL HIGH (ref 70–99)
Potassium: 3.6 mmol/L (ref 3.5–5.1)
Sodium: 135 mmol/L (ref 135–145)
Total Bilirubin: 0.4 mg/dL (ref 0.3–1.2)
Total Protein: 5.7 g/dL — ABNORMAL LOW (ref 6.5–8.1)

## 2019-08-17 LAB — GI PATHOGEN PANEL BY PCR, STOOL
Adenovirus F 40/41: NOT DETECTED
Astrovirus: NOT DETECTED
Campylobacter by PCR: DETECTED — AB
Cryptosporidium by PCR: DETECTED — AB
Cyclospora cayetanensis: NOT DETECTED
E coli (ETEC) LT/ST: NOT DETECTED
E coli (STEC): NOT DETECTED
Entamoeba histolytica: NOT DETECTED
Enteroaggregative E coli: NOT DETECTED
Enteropathogenic E coli: DETECTED — AB
G lamblia by PCR: NOT DETECTED
Norovirus GI/GII: NOT DETECTED
Plesiomonas shigelloides: NOT DETECTED
Rotavirus A by PCR: NOT DETECTED
Salmonella by PCR: NOT DETECTED
Sapovirus: NOT DETECTED
Shigella by PCR: NOT DETECTED
Vibrio cholerae: NOT DETECTED
Vibrio: NOT DETECTED
Yersinia enterocolitica: NOT DETECTED

## 2019-08-17 LAB — CBC
HCT: 26.4 % — ABNORMAL LOW (ref 39.0–52.0)
Hemoglobin: 8.1 g/dL — ABNORMAL LOW (ref 13.0–17.0)
MCH: 27.6 pg (ref 26.0–34.0)
MCHC: 30.7 g/dL (ref 30.0–36.0)
MCV: 90.1 fL (ref 80.0–100.0)
Platelets: 301 10*3/uL (ref 150–400)
RBC: 2.93 MIL/uL — ABNORMAL LOW (ref 4.22–5.81)
RDW: 16.9 % — ABNORMAL HIGH (ref 11.5–15.5)
WBC: 4.2 10*3/uL (ref 4.0–10.5)
nRBC: 0 % (ref 0.0–0.2)

## 2019-08-17 LAB — HIV ANTIBODY (ROUTINE TESTING W REFLEX): HIV Screen 4th Generation wRfx: NONREACTIVE

## 2019-08-17 MED ORDER — PEG-KCL-NACL-NASULF-NA ASC-C 100 G PO SOLR
0.5000 | Freq: Once | ORAL | Status: AC
  Administered 2019-08-18: 100 g via ORAL

## 2019-08-17 MED ORDER — PEG-KCL-NACL-NASULF-NA ASC-C 100 G PO SOLR: 1.0000 | Freq: Once | ORAL | Status: DC

## 2019-08-17 MED ORDER — AZITHROMYCIN 500 MG PO TABS
500.0000 mg | ORAL_TABLET | Freq: Every day | ORAL | Status: DC
  Administered 2019-08-17: 500 mg via ORAL
  Filled 2019-08-17: qty 1

## 2019-08-17 MED ORDER — PEG-KCL-NACL-NASULF-NA ASC-C 100 G PO SOLR
0.5000 | Freq: Once | ORAL | Status: AC
  Administered 2019-08-17: 100 g via ORAL
  Filled 2019-08-17: qty 1

## 2019-08-17 NOTE — Progress Notes (Signed)
Brief GI progress note  I was able to discuss his case with Dr. Tarri Glenn and PA Glyn Ade.  The patient's GI pathogen panel has returned positive for polymicrobial infection.  This is extremely rare for [place] to see the significant amount of findings on a GI pathogen panel.  The longevity/[place] of his symptoms is concerning for potential underlying process such as inflammatory bowel disease or CMV colitis.  His he was HIV negative earlier this year.  While normally an acute infection would be a potential reason to hold on a colonoscopic evaluation due to the chronicity of symptoms and the bleeding and the anemia it is worthwhile for [place] to evaluate and ensure that there is not an underlying chronic disease process.  He will be at slightly higher risk of complications but if upon endoscopic evaluation things are very ill-appearing then we will subsequently minimize the procedure as much as possible.  I will meet the patient further on 10/29 for planned procedure.  Jacob Britain, MD [facility] Office # [phone]

## 2019-08-17 NOTE — Progress Notes (Signed)
PROGRESS NOTE    Jacob Adams  WUJ:[id number] DOB: 06-08-1966 DOA: 08/15/2019 PCP: System, Pcp Not In      Brief Narrative:  Jacob Adams is a 53 y.o. M with hx sCHF EF 25%, NICM, HTN, hypothyroidism, Alcohol use disorder, OSA not on CPAP, pancreatitis, PUD, anemia and smoking who presented with worsening bloody diarrhea for 4 days.  Patient had noted increased blood in stool intermittently for over a month.  Then in last few days, had new fever (so did some family members, diarrhea and fever), thought he had COVID, tested negative in the ER 2 days PTA and was discharged home.    In the ER tachycardic, hemoglobin 9.910 from baseline 13, fever to 102F, CT abdomen pelvis showed colitis.  Started on IV fluids and empiric antibiotics for infectious colitis and admitted.     Assessment & Plan:  Sepsis due to acute infectious colitis Patient febrile, tachycardic, with acute kidney injury at presentation.    CT showed diffuse wall thickening of the splenic flexure and transverse colon.  Several family members also with diarrhea, fever.  C. difficile negative.   -Follow GI pathogen panel -Continue Cipro Flagyl  Addendum: Multiple microbes on GI panel.  Interesting to me too.   -Repeat HIV -He's mostly better, so I will give 2 days azithro then stop -Nitazoxanide not on formulary, will discuss with ID Rph tomorrow     Acute blood loss anemia From colitis.  Hemoglobin still trending down. -Trend hemoglobin  AKI Baseline creatinine 0.9-1, presented with creatinine 1.3 Now resolved with fuids -Hold losartan  Hypokalemia Hypomagnesemia Repleted -Repeat BMP   Chronic systolic CHF Hypertension Appears euvolemic at present.  On spiro not Lasix at baseline.    BP soft -Hold losartan, spironolactone -Continue carvedilol, atorvastatin  Hypothyroidism -Continue levothyroxine  Alcohol use Previous history of complicated alcohol withdrawal -CIWA protocol with on-demand lorazepam  -Thiamine and folate  Elevated troponin Suspect demand ishcemia, duobt ACS  Smoking Cessation recommended  Prediabetes A1c 5.5%, not on antiglycemics  Other medications -Continue gabapentin, duloxetine, Pepcid, Remeron, Zyprexa, Protonix      MDM and disposition: The below labs and imaging reports were reviewed and summarized above.  Medication management as above.  The patient was admitted with colitis.    The patient was admitted with sepsis from colitis, has had >3g drop in Hgb from baseline, and will require work up with colonoscopy and ongoing IV antibiotics      DVT prophylaxis: Lovenox Code Status: FULL Family Communication:     Consultants:   GI  Procedures:   Possible colonoscopy planned  Antimicrobials:   Ciprofloxacin and Flagyl x3 days  Azithromycin x1 on admission, then 10/28 >>  Antimicrobials:   10/26 blood cultures x2 -- NG  10/26 urine culture -- multiple species       Subjective: Stools are more formed, no blood in them today.  No vomiting, fever, confusion, abdominal pain.  No dyspnea, orthopnea, swelling.  Objective: Vitals:   08/17/19 0515 08/17/19 0854 08/17/19 0858 08/17/19 1700  BP: 118/78 112/78  129/74  Pulse: 76 84  80  Resp: 18 18  18   Temp: 98 F (36.7 C)  (!) 97.5 F (36.4 C) 98 F (36.7 C)  TempSrc: Oral  Oral Oral  SpO2: 100% 97%  98%  Weight:      Height:        Intake/Output Summary (Last 24 hours) at 08/17/2019 1801 Last data filed at 08/17/2019 1500 Gross per 24 hour  Intake 2699.79 ml  Output 2 ml  Net 2697.79 ml   Filed Weights   08/15/19 1258 08/16/19 2142  Weight: 104.3 kg 104.3 kg    Examination: General appearance: Adult male, sitting in the edge of bed, no acute distress HEENT: Anicteric, conjunctival pink, lids and lashes normal.  No nasal deformity, discharge, or epistaxis.  Lips moist, dentition normal, oropharynx moist, no oral lesions Skin: Warm and dry, no suspicious rashes or  lesions. Cardiac: Regular rate and rhythm, no murmurs appreciated, JVP normal, no lower extremity edema. Respiratory: Normal respiratory rate and rhythm, lungs clear without rales or wheezes. Abdomen: Abdomen soft, mild nonfocal tenderness to palpation with guarding, no ascites or distention. MSK: No deformities or effusions. Neuro: Awake and alert, extraocular movements intact, moves all extremities normal strength and coordination, speech fluent. Psych: Sensorium intact responding to questions, attention normal, affect normal, judgment insight appear normal.    Data Reviewed: I have personally reviewed following labs and imaging studies:  CBC: Recent Labs  Lab 08/13/19 1750 08/15/19 1220 08/15/19 2238 08/16/19 0506 08/17/19 0518  WBC 8.6 10.3 8.6 6.5 4.2  NEUTROABS  --  8.2* 6.6  --   --   HGB 9.9* 9.6* 9.3* 8.7* 8.1*  HCT 33.2* 30.8* 29.6* 28.6* 26.4*  MCV 97.4 89.3 89.2 92.9 90.1  PLT 159 344 304 284 301   Basic Metabolic Panel: Recent Labs  Lab 08/13/19 1750 08/15/19 1220 08/15/19 1937 08/16/19 0506 08/17/19 0518  NA 140 135  --  137 135  K 3.7 2.9*  --  3.6 3.6  CL 108 106  --  111 109  CO2 17* 16*  --  17* 19*  GLUCOSE 103* 144*  --  108* 111*  BUN 6 8  --  7 8  CREATININE 0.98 1.32*  --  1.02 0.96  CALCIUM 8.9 8.4*  --  7.9* 7.9*  MG  --   --  1.5* 2.4  --    GFR: Estimated Creatinine Clearance: 106.4 mL/min (by C-G formula based on SCr of 0.96 mg/dL). Liver Function Tests: Recent Labs  Lab 08/15/19 1220 08/17/19 0518  AST 29 23  ALT 20 15  ALKPHOS 52 45  BILITOT 0.5 0.4  PROT 7.4 5.7*  ALBUMIN 3.3* 2.6*   Recent Labs  Lab 08/15/19 1220  LIPASE 35   No results for input(s): AMMONIA in the last 168 hours. Coagulation Profile: Recent Labs  Lab 08/15/19 2238  INR 1.1   Cardiac Enzymes: No results for input(s): CKTOTAL, CKMB, CKMBINDEX, TROPONINI in the last 168 hours. BNP (last 3 results) No results for input(s): PROBNP in the last 8760  hours. HbA1C: Recent Labs    08/16/19 0500  HGBA1C 6.0*   CBG: No results for input(s): GLUCAP in the last 168 hours. Lipid Profile: Recent Labs    08/16/19 0500  CHOL 127  HDL 48  LDLCALC 63  TRIG 81  CHOLHDL 2.6   Thyroid Function Tests: No results for input(s): TSH, T4TOTAL, FREET4, T3FREE, THYROIDAB in the last 72 hours. Anemia Panel: No results for input(s): VITAMINB12, FOLATE, FERRITIN, TIBC, IRON, RETICCTPCT in the last 72 hours. Urine analysis:    Component Value Date/Time   COLORURINE YELLOW 08/15/2019 1420   APPEARANCEUR CLEAR 08/15/2019 1420   LABSPEC 1.025 08/15/2019 1420   PHURINE 5.5 08/15/2019 1420   GLUCOSEU NEGATIVE 08/15/2019 1420   HGBUR MODERATE (A) 08/15/2019 1420   BILIRUBINUR NEGATIVE 08/15/2019 1420   KETONESUR NEGATIVE 08/15/2019 1420   PROTEINUR 100 (A)  08/15/2019 1420   UROBILINOGEN 0.2 08/25/2013 2023   NITRITE NEGATIVE 08/15/2019 1420   LEUKOCYTESUR NEGATIVE 08/15/2019 1420   Sepsis Labs: @LABRCNTIP (procalcitonin:4,lacticacidven:4)  ) Recent Results (from the past 240 hour(s))  SARS CORONAVIRUS 2 (TAT 6-24 HRS) Nasopharyngeal Nasopharyngeal Swab     Status: None   Collection Time: 08/13/19 10:38 PM   Specimen: Nasopharyngeal Swab  Result Value Ref Range Status   SARS Coronavirus 2 NEGATIVE NEGATIVE Final    Comment: (NOTE) SARS-CoV-2 target nucleic acids are NOT DETECTED. The SARS-CoV-2 RNA is generally detectable in upper and lower respiratory specimens during the acute phase of infection. Negative results do not preclude SARS-CoV-2 infection, do not rule out co-infections with other pathogens, and should not be used as the sole basis for treatment or other patient management decisions. Negative results must be combined with clinical observations, patient history, and epidemiological information. The expected result is Negative. Fact Sheet for Patients: HairSlick.no Fact Sheet for Healthcare  Providers: quierodirigir.com This test is not yet approved or cleared by the [place] FDA and  has been authorized for detection and/or diagnosis of SARS-CoV-2 by FDA under an Emergency Use Authorization (EUA). This EUA will remain  in effect (meaning this test can be used) for the duration of the COVID-19 declaration under Section 56 4(b)(1) of the Act, 21 U.S.C. section 360bbb-3(b)(1), unless the authorization is terminated or revoked sooner. Performed at [facility], [address] [place], [place] [address]   Blood Culture (routine x 2)     Status: None (Preliminary result)   Collection Time: 08/15/19 11:37 AM   Specimen: BLOOD RIGHT WRIST  Result Value Ref Range Status   Specimen Description BLOOD RIGHT WRIST  Final   Special Requests   Final    BOTTLES DRAWN AEROBIC AND ANAEROBIC Blood Culture adequate volume   Culture   Final    NO GROWTH 2 DAYS Performed at [facility], [address] [place], [place] [address]    Report Status PENDING  Incomplete  SARS CORONAVIRUS 2 (TAT 6-24 HRS) Nasopharyngeal Nasopharyngeal Swab     Status: None   Collection Time: 08/15/19 12:22 PM   Specimen: Nasopharyngeal Swab  Result Value Ref Range Status   SARS Coronavirus 2 NEGATIVE NEGATIVE Final    Comment: (NOTE) SARS-CoV-2 target nucleic acids are NOT DETECTED. The SARS-CoV-2 RNA is generally detectable in upper and lower respiratory specimens during the acute phase of infection. Negative results do not preclude SARS-CoV-2 infection, do not rule out co-infections with other pathogens, and should not be used as the sole basis for treatment or other patient management decisions. Negative results must be combined with clinical observations, patient history, and epidemiological information. The expected result is Negative. Fact Sheet for Patients: HairSlick.no Fact Sheet for Healthcare Providers:  quierodirigir.com This test is not yet approved or cleared by the [place] FDA and  has been authorized for detection and/or diagnosis of SARS-CoV-2 by FDA under an Emergency Use Authorization (EUA). This EUA will remain  in effect (meaning this test can be used) for the duration of the COVID-19 declaration under Section 56 4(b)(1) of the Act, 21 U.S.C. section 360bbb-3(b)(1), unless the authorization is terminated or revoked sooner. Performed at [facility], [address], [place], [place] [address]   Blood Culture (routine x 2)     Status: None (Preliminary result)   Collection Time: 08/15/19  1:15 PM   Specimen: BLOOD LEFT FOREARM  Result Value Ref Range Status  Specimen Description BLOOD LEFT FOREARM  Final   Special Requests   Final    BOTTLES DRAWN AEROBIC AND ANAEROBIC Blood Culture adequate volume   Culture   Final    NO GROWTH 2 DAYS Performed at [facility], [address] [place], [place] [address]    Report Status PENDING  Incomplete  Urine culture     Status: Abnormal   Collection Time: 08/15/19  2:20 PM   Specimen: In/Out Cath Urine  Result Value Ref Range Status   Specimen Description IN/OUT CATH URINE  Final   Special Requests   Final    NONE Performed at [facility], [address] [place], [place] [address]    Culture MULTIPLE SPECIES PRESENT, SUGGEST RECOLLECTION (A)  Final   Report Status 08/16/2019 FINAL  Final  GI pathogen panel by PCR, stool     Status: Abnormal   Collection Time: 08/15/19  5:05 PM   Specimen: Stool  Result Value Ref Range Status   Plesiomonas shigelloides NOT DETECTED NOT DETECTED Final   Yersinia enterocolitica NOT DETECTED NOT DETECTED Final   Vibrio NOT DETECTED NOT DETECTED Final   Enteropathogenic E coli DETECTED (A) NOT DETECTED Final   E coli (ETEC) LT/ST NOT DETECTED NOT DETECTED Final   E coli 0157 by PCR Not applicable NOT DETECTED Final   Cryptosporidium by PCR  DETECTED (A) NOT DETECTED Final   Entamoeba histolytica NOT DETECTED NOT DETECTED Final   Adenovirus F 40/41 NOT DETECTED NOT DETECTED Final   Norovirus GI/GII NOT DETECTED NOT DETECTED Final   Sapovirus NOT DETECTED NOT DETECTED Final    Comment: (NOTE) Performed At: [facility] [address] [place], [place] [id number] Jolene Schimke MD HQ:[id number]    Vibrio cholerae NOT DETECTED NOT DETECTED Final   Campylobacter by PCR DETECTED (A) NOT DETECTED Final   Salmonella by PCR NOT DETECTED NOT DETECTED Final   E coli (STEC) NOT DETECTED NOT DETECTED Final   Enteroaggregative E coli NOT DETECTED NOT DETECTED Final   Shigella by PCR NOT DETECTED NOT DETECTED Final   Cyclospora cayetanensis NOT DETECTED NOT DETECTED Final   Astrovirus NOT DETECTED NOT DETECTED Final   G lamblia by PCR NOT DETECTED NOT DETECTED Final   Rotavirus A by PCR NOT DETECTED NOT DETECTED Final  C difficile quick scan w PCR reflex     Status: None   Collection Time: 08/15/19  7:30 PM   Specimen: Stool  Result Value Ref Range Status   C Diff antigen NEGATIVE NEGATIVE Final   C Diff toxin NEGATIVE NEGATIVE Final   C Diff interpretation No C. difficile detected.  Final    Comment: Performed at [facility], [address], [place], [place] [address]         Radiology Studies: Ct Abdomen Pelvis W Contrast  Result Date: 08/15/2019 CLINICAL DATA:  Abdominal pain EXAM: CT ABDOMEN AND PELVIS WITH CONTRAST TECHNIQUE: Multidetector CT imaging of the abdomen and pelvis was performed using the standard protocol following bolus administration of intravenous contrast. CONTRAST:  OMNIPAQUE IOHEXOL 300 MG/ML  SOLN COMPARISON:  October 31, 2015 FINDINGS: Lower chest: The lung bases are clear. The heart size is normal. Hepatobiliary: The liver is normal. Normal gallbladder.There is no biliary ductal dilation. Pancreas: Normal contours without ductal dilatation. No peripancreatic fluid collection. Spleen:  No splenic laceration or hematoma. Adrenals/Urinary Tract: --Adrenal glands: No adrenal hemorrhage. --Right kidney/ureter: No hydronephrosis or perinephric hematoma. --Left kidney/ureter: No hydronephrosis  or perinephric hematoma. --Urinary bladder: Unremarkable. Stomach/Bowel: --Stomach/Duodenum: No hiatal hernia or other gastric abnormality. Normal duodenal course and caliber. --Small bowel: No dilatation or inflammation. --Colon: There is diffuse wall thickening of the splenic flexure of the colon and portions of the transverse colon. --Appendix: Normal. Vascular/Lymphatic: Normal course and caliber of the major abdominal vessels. --No retroperitoneal lymphadenopathy. --No mesenteric lymphadenopathy. --No pelvic or inguinal lymphadenopathy. Reproductive: Unremarkable Other: No ascites or free air. The abdominal wall is normal. Musculoskeletal. No acute displaced fractures. IMPRESSION: Diffuse wall thickening of the splenic flexure of the colon and portions of the transverse colon, consistent with colitis, likely infectious or inflammatory. Ischemic colitis seems less likely given the relative lack of vascular disease within the abdomen. Electronically Signed   By: Katherine Mantle M.D.   On: 08/15/2019 19:12        Scheduled Meds: . atorvastatin  40 mg Oral Daily  . azithromycin  500 mg Oral Daily  . carvedilol  12.5 mg Oral BID WC  . DULoxetine  30 mg Oral Daily  . famotidine  20 mg Oral BID  . folic acid  1 mg Oral Daily  . gabapentin  300 mg Oral TID  . levothyroxine  88 mcg Oral Q0600  . LORazepam  0-4 mg Intravenous Q6H   Followed by  . LORazepam  0-4 mg Intravenous Q12H  . mirtazapine  15 mg Oral QHS  . multivitamin with minerals  1 tablet Oral Daily  . nicotine  21 mg Transdermal Daily  . OLANZapine  20 mg Oral Daily  . pantoprazole  40 mg Oral BID  . [START ON 08/18/2019] peg 3350 powder  0.5 kit Oral Once  . thiamine  100 mg Oral Daily   Or  . thiamine  100 mg Intravenous  Daily   Continuous Infusions:    LOS: 2 days    Time spent: 25 minutes    Alberteen Sam, MD [facility] 08/17/2019, 6:01 PM     Please page through AMION:  [email or web address] If 7PM-7AM, please contact night-coverage
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
## 2019-08-17 NOTE — Plan of Care (Signed)
  Problem: Activity: Goal: Risk for activity intolerance will decrease Outcome: Progressing   Problem: Elimination: Goal: Will not experience complications related to bowel motility Outcome: Progressing   
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
## 2019-08-17 NOTE — Plan of Care (Signed)
  Problem: Health Behavior/Discharge Planning: Goal: Ability to manage health-related needs will improve Outcome: Progressing   

## 2019-08-18 ENCOUNTER — Encounter (HOSPITAL_COMMUNITY): Payer: Self-pay | Admitting: *Deleted

## 2019-08-18 ENCOUNTER — Encounter (HOSPITAL_COMMUNITY)
Admission: EM | Disposition: A | Discharge status: Home / Self Care | Payer: Self-pay | Source: Home / Self Care | Attending: Family Medicine

## 2019-08-18 ENCOUNTER — Inpatient Hospital Stay (HOSPITAL_COMMUNITY): Payer: No Typology Code available for payment source | Admitting: Anesthesiology

## 2019-08-18 DIAGNOSIS — N179 Acute kidney failure, unspecified: Secondary | ICD-10-CM | POA: Diagnosis not present

## 2019-08-18 DIAGNOSIS — D500 Iron deficiency anemia secondary to blood loss (chronic): Secondary | ICD-10-CM

## 2019-08-18 DIAGNOSIS — K529 Noninfective gastroenteritis and colitis, unspecified: Secondary | ICD-10-CM | POA: Diagnosis not present

## 2019-08-18 DIAGNOSIS — K921 Melena: Secondary | ICD-10-CM | POA: Diagnosis not present

## 2019-08-18 DIAGNOSIS — A419 Sepsis, unspecified organism: Secondary | ICD-10-CM | POA: Diagnosis not present

## 2019-08-18 DIAGNOSIS — R197 Diarrhea, unspecified: Secondary | ICD-10-CM | POA: Diagnosis not present

## 2019-08-18 HISTORY — PX: COLONOSCOPY WITH PROPOFOL: SHX5780

## 2019-08-18 HISTORY — PX: BIOPSY: SHX5522

## 2019-08-18 LAB — BASIC METABOLIC PANEL
Anion gap: 8 (ref 5–15)
BUN: <5 mg/dL — ABNORMAL LOW (ref 6–20)
CO2: 18 mmol/L — ABNORMAL LOW (ref 22–32)
Calcium: 8.1 mg/dL — ABNORMAL LOW (ref 8.9–10.3)
Chloride: 111 mmol/L (ref 98–111)
Creatinine, Ser: 0.98 mg/dL (ref 0.61–1.24)
GFR calc Af Amer: >60 mL/min (ref >60)
GFR calc non Af Amer: >60 mL/min (ref >60)
Glucose, Bld: 95 mg/dL (ref 70–99)
Potassium: 3.9 mmol/L (ref 3.5–5.1)
Sodium: 137 mmol/L (ref 135–145)

## 2019-08-18 LAB — CBC
HCT: 26.4 % — ABNORMAL LOW (ref 39.0–52.0)
Hemoglobin: 8.2 g/dL — ABNORMAL LOW (ref 13.0–17.0)
MCH: 27.9 pg (ref 26.0–34.0)
MCHC: 31.1 g/dL (ref 30.0–36.0)
MCV: 89.8 fL (ref 80.0–100.0)
Platelets: 333 10*3/uL (ref 150–400)
RBC: 2.94 MIL/uL — ABNORMAL LOW (ref 4.22–5.81)
RDW: 16.8 % — ABNORMAL HIGH (ref 11.5–15.5)
WBC: 3.9 10*3/uL — ABNORMAL LOW (ref 4.0–10.5)
nRBC: 0 % (ref 0.0–0.2)

## 2019-08-18 SURGERY — COLONOSCOPY WITH PROPOFOL
Anesthesia: Monitor Anesthesia Care

## 2019-08-18 MED ORDER — PHENYLEPHRINE 40 MCG/ML (10ML) SYRINGE FOR IV PUSH (FOR BLOOD PRESSURE SUPPORT)
PREFILLED_SYRINGE | INTRAVENOUS | Status: DC | PRN
  Administered 2019-08-18 (×2): 80 ug via INTRAVENOUS

## 2019-08-18 MED ORDER — AZITHROMYCIN 500 MG PO TABS: 500.0000 mg | ORAL_TABLET | Freq: Every day | ORAL | 0 refills | Status: DC

## 2019-08-18 MED ORDER — PROPOFOL 500 MG/50ML IV EMUL
INTRAVENOUS | Status: DC | PRN
  Administered 2019-08-18: 150 ug/kg/min via INTRAVENOUS

## 2019-08-18 MED ORDER — NITAZOXANIDE 500 MG PO TABS: 500.0000 mg | ORAL_TABLET | Freq: Every day | ORAL | 0 refills | Status: DC

## 2019-08-18 MED ORDER — LACTATED RINGERS IV SOLN
INTRAVENOUS | Status: DC | PRN
  Administered 2019-08-18: 13:00:00 via INTRAVENOUS

## 2019-08-18 MED ORDER — PROPOFOL 10 MG/ML IV BOLUS
INTRAVENOUS | Status: DC | PRN
  Administered 2019-08-18 (×2): 50 mg via INTRAVENOUS

## 2019-08-18 MED ORDER — LIDOCAINE 2% (20 MG/ML) 5 ML SYRINGE
INTRAMUSCULAR | Status: DC | PRN
  Administered 2019-08-18: 100 mg via INTRAVENOUS

## 2019-08-18 MED FILL — AZITHROMYCIN 500 MG TABLET: 500 | 2 days supply | Qty: 2 | Fill #0

## 2019-08-18 SURGICAL SUPPLY — 22 items

## 2019-08-18 NOTE — Anesthesia Postprocedure Evaluation (Signed)
Anesthesia Post Note  Patient: Jacob Adams  Procedure(s) Performed: COLONOSCOPY WITH PROPOFOL (N/A ) BIOPSY     Patient location during evaluation: PACU Anesthesia Type: MAC Level of consciousness: awake and alert Pain management: pain level controlled Vital Signs Assessment: post-procedure vital signs reviewed and stable Respiratory status: spontaneous breathing, nonlabored ventilation, respiratory function stable and patient connected to nasal cannula oxygen Cardiovascular status: stable and blood pressure returned to baseline Postop Assessment: no apparent nausea or vomiting Anesthetic complications: no    Last Vitals:  Vitals:   08/18/19 1343 08/18/19 1408  BP: 126/74 117/83  Pulse: 76 68  Resp: 13 18  Temp:  36.8 C  SpO2: 100% 100%    Last Pain:  Vitals:   08/18/19 1410  TempSrc:   PainSc: 0-No pain                 Kenetha Cozza S

## 2019-08-18 NOTE — Plan of Care (Signed)
  Problem: Activity: Goal: Risk for activity intolerance will decrease Outcome: Progressing   

## 2019-08-18 NOTE — Progress Notes (Signed)
DISCHARGE NOTE HOME Jacob Adams to be discharged Home per MD order. Discussed prescriptions and follow up appointments with the patient. Prescriptions given to patient; medication list explained in detail. Patient verbalized understanding.  Skin clean, dry and intact without evidence of skin break down, no evidence of skin tears noted. IV catheter discontinued intact. Site without signs and symptoms of complications. Dressing and pressure applied. Pt denies pain at the site currently. No complaints noted.  Patient free of lines, drains, and wounds.   An After Visit Summary (AVS) was printed and given to the patient. Patient escorted via wheelchair, and discharged home via private auto.  Paulla Fore, RN

## 2019-08-18 NOTE — Transfer of Care (Signed)
Immediate Anesthesia Transfer of Care Note  Patient: Jacob Adams  Procedure(s) Performed: COLONOSCOPY WITH PROPOFOL (N/A ) BIOPSY  Patient Location: PACU  Anesthesia Type:MAC  Level of Consciousness: awake  Airway & Oxygen Therapy: Patient Spontanous Breathing and Patient connected to face mask oxygen  Post-op Assessment: Report given to RN and Post -op Vital signs reviewed and stable  Post vital signs: Reviewed and stable  Last Vitals:  Vitals Value Taken Time  BP 99/41 08/18/19 1323  Temp 36.3 C 08/18/19 1323  Pulse 84 08/18/19 1324  Resp 24 08/18/19 1324  SpO2 100 % 08/18/19 1324  Vitals shown include unvalidated device data.  Last Pain:  Vitals:   08/18/19 1323  TempSrc: Temporal  PainSc:       Patients Stated Pain Goal: 0 (94/94/47 3958)  Complications: No apparent anesthesia complications

## 2019-08-18 NOTE — Anesthesia Preprocedure Evaluation (Addendum)
Anesthesia Evaluation  Patient identified by MRN, date of birth, ID band Patient awake    Reviewed: Allergy & Precautions, NPO status , Patient's Chart, lab work & pertinent test results  Airway Mallampati: IV  TM Distance: >3 FB Neck ROM: Full    Dental  (+) Partial Upper, Chipped, Poor Dentition   Pulmonary sleep apnea , Current Smoker and Patient abstained from smoking.,    Pulmonary exam normal breath sounds clear to auscultation       Cardiovascular hypertension, Pt. on medications and Pt. on home beta blockers +CHF  Normal cardiovascular exam Rhythm:Regular Rate:Normal  ECG: ST, rate 124   Neuro/Psych  Headaches, PSYCHIATRIC DISORDERS Anxiety Depression Bipolar Disorder  Neuromuscular disease negative psych ROS   GI/Hepatic GERD  Medicated and Controlled,(+)     substance abuse  alcohol use,   Endo/Other  Hypothyroidism   Renal/GU negative Renal ROS     Musculoskeletal negative musculoskeletal ROS (+)   Abdominal (+) + obese,   Peds  Hematology  (+) anemia , HLD   Anesthesia Other Findings Hematochezia, colitis, diarrhea  Reproductive/Obstetrics                           Anesthesia Physical Anesthesia Plan  ASA: III  Anesthesia Plan: MAC   Post-op Pain Management:    Induction: Intravenous  PONV Risk Score and Plan: 1 and Propofol infusion and Treatment may vary due to age or medical condition  Airway Management Planned: Simple Face Mask  Additional Equipment:   Intra-op Plan:   Post-operative Plan:   Informed Consent: I have reviewed the patients History and Physical, chart, labs and discussed the procedure including the risks, benefits and alternatives for the proposed anesthesia with the patient or authorized representative who has indicated his/her understanding and acceptance.     Dental advisory given  Plan Discussed with: CRNA  Anesthesia Plan Comments:        Anesthesia Quick Evaluation

## 2019-08-18 NOTE — Interval H&P Note (Signed)
History and Physical Interval Note:  08/18/2019 12:30 PM  Jacob Adams  has presented today for surgery, with the diagnosis of Hematochezia, colitis, diarrhea.  The various methods of treatment have been discussed with the patient and family. After consideration of risks, benefits and other options for treatment, the patient has consented to  Procedure(s): COLONOSCOPY WITH PROPOFOL (N/A) as a surgical intervention.  The patient's history has been reviewed, patient examined, no change in status, stable for surgery.  I have reviewed the patient's chart and labs.  Questions were answered to the patient's satisfaction.     [employer]

## 2019-08-18 NOTE — Op Note (Signed)
[facility] Patient Name: Jacob Adams Procedure Date : 08/18/2019 MRN: [id number] Attending MD: Corliss Parish , MD Date of Birth: 1966/09/13 CSN: [id number] Age: 53 Admit Type: Inpatient Procedure:                Colonoscopy Indications:              Chronic diarrhea, Hematochezia, Iron deficiency                            anemia secondary to chronic blood loss Providers:                Corliss Parish, MD, Zoe Lan, RN, Beryle Beams, Technician, Clare Gandy, CRNA Referring MD:             [facility] Medicines:                Monitored Anesthesia Care Complications:            No immediate complications. Estimated Blood Loss:     Estimated blood loss was minimal. Procedure:                Pre-Anesthesia Assessment:                           - Prior to the procedure, a History and Physical                            was performed, and patient medications and                            allergies were reviewed. The patient's tolerance of                            previous anesthesia was also reviewed. The risks                            and benefits of the procedure and the sedation                            options and risks were discussed with the patient.                            All questions were answered, and informed consent                            was obtained. Prior Anticoagulants: The patient has                            taken no previous anticoagulant or antiplatelet                            agents. ASA Grade Assessment: III - A patient with  severe systemic disease. After reviewing the risks                            and benefits, the patient was deemed in                            satisfactory condition to undergo the procedure.                           After obtaining informed consent, the colonoscope                            was passed under direct vision. Throughout  the                            procedure, the patient's blood pressure, pulse, and                            oxygen saturations were monitored continuously. The                            PCF-H190DL ([id number]) Olympus pediatric colonoscope                            was introduced through the anus and advanced to the                            6 cm into the ileum. The colonoscopy was performed                            without difficulty. The patient tolerated the                            procedure. The quality of the bowel preparation was                            adequate. The terminal ileum, ileocecal valve,                            appendiceal orifice, and rectum were photographed. Scope In: 12:55:21 PM Scope Out: 1:15:28 PM Scope Withdrawal Time: 0 hours 14 minutes 58 seconds  Total Procedure Duration: 0 hours 20 minutes 7 seconds  Findings:      The digital rectal exam findings include hemorrhoids. Pertinent       negatives include no palpable rectal lesions.      The terminal ileum and ileocecal valve appeared normal. Biopsies were       taken with a cold forceps for histology to rule out chronic ileitis.      Normal mucosa was found in the transverse colon, in the proximal       transverse colon, in the ascending colon and in the cecum. Biopsies were       taken with a cold forceps for histology to rule out chronic colitis and       microscopic colitis.      Localized  moderate mucosal changes characterized by altered vascularity,       congestion (edema) and granularity were found in the mid transverse       colon and in the distal transverse colon. Biopsies were taken with a       cold forceps for histology to rule out chronic colitis and microscopic       colitis.      Normal mucosa was found in the recto-sigmoid colon, in the sigmoid colon       and in the descending colon. Biopsies were taken with a cold forceps for       histology to rule out chronic colitis and  microscopic colitis.      Normal mucosa was found in the rectum. Biopsies were taken with a cold       forceps for histology to rule out chronic proctitis.      Non-bleeding non-thrombosed internal hemorrhoids were found during       retroflexion, during perianal exam and during digital exam. The       hemorrhoids were Grade II (internal hemorrhoids that prolapse but reduce       spontaneously). Impression:               - Hemorrhoids found on digital rectal exam.                           - The examined portion of the ileum was normal.                            Biopsied.                           - Normal mucosa in the transverse colon, in the                            proximal transverse colon, in the ascending colon                            and in the cecum. Biopsied.                           - Localized moderate mucosal changes were found in                            the mid transverse colon and in the distal                            transverse colon. Biopsied.                           - Normal mucosa in the recto-sigmoid colon, in the                            sigmoid colon and in the descending colon. Biopsied.                           - Normal mucosa in the rectum. Biopsied.                           -  Non-bleeding non-thrombosed internal hemorrhoids. Recommendation:           - The patient will be observed post-procedure,                            until all discharge criteria are met.                           - Return patient to hospital ward for ongoing care.                           - Patient has a contact number available for                            emergencies. The signs and symptoms of potential                            delayed complications were discussed with the                            patient. Return to normal activities tomorrow.                            Written discharge instructions were provided to the                            patient.                            - Resume previous diet.                           - Use FiberCon 1 tablet PO daily.                           - The patient's colonoscopy is not typical for IBD                            - though biopsies are now pending. As discussed on                            consultation note from yesterday, it is now                            reasonable, with the findings on the GI Pathogen                            panel to proceed with treatment with Azithromycin                            and Nitazoxide x 3-days.                           - Follow up with [facility] and with [facility]  PCP.                           - If patient wants follow up in [place] then he can                            enroll through [facility] and we would be happy to                            see him in the [place] office for follow up.                           - Follow up Pathology.                           - The findings and recommendations were discussed                            with the patient.                           - The findings and recommendations were discussed                            with the referring physician. Procedure Code(s):        --- Professional ---                           [phone], Colonoscopy, flexible; with biopsy, single                            or multiple Diagnosis Code(s):        --- Professional ---                           K64.1, Second degree hemorrhoids                           K63.89, Other specified diseases of intestine                           K52.9, Noninfective gastroenteritis and colitis,                            unspecified                           K92.1, Melena (includes Hematochezia)                           D50.0, Iron deficiency anemia secondary to blood                            loss (chronic) CPT copyright 2019 [employer]. All rights reserved. The codes documented in this report are  preliminary and upon coder review may  be revised to meet current compliance requirements. Corliss Parish, MD  08/18/2019 1:44:02 PM Number of Addenda: 0

## 2019-08-18 NOTE — Care Management (Addendum)
Spoke w patient who states that his PCP at the [facility] is Dr. Jettie Booze. His coverage is through the [place], and he gets his medications through the [facility] by mail order. [place]- [phone] Dr Rutherford's office [phone], [phone]  Transfer coordinator Brenda [place]- [place] [phone] ext [phone] [facility] notified Dr Jettie Booze of admission.   For any new home medications or DME/ HH needs, please coordinate M-F as [place] facilities are closed over the weekend.  Please fax DC summary to number above.    16:30 New meds filled through [facility], and notes faxed to number above. Wife to transport home.

## 2019-08-18 NOTE — Progress Notes (Signed)
Pt is back from ENDO. Pt denies any pain and is alert & oriented per baseline. VSS, respirations even and unlabored, no distress noted. Pt has ordered lunch tray and is sitting up in bed. Bed in lowest position and call light within reach.    Paulla Fore, RN, BSN

## 2019-08-18 NOTE — Discharge Summary (Signed)
Physician Discharge Summary  Jacob Adams ZOX:[id number] DOB: [birth year] DOA: 08/15/2019  PCP: System, Pcp Not In  Admit date: 08/15/2019 Discharge date: 08/18/2019  Admitted From: Home  Disposition:  Home   Recommendations for Outpatient Follow-up:  1. Follow up with Jacob Adams PCP in 1 week 2. Jacob Adams: Please obtain CBC in 1 week 3. Jacob Adams: [facility] or to [facility] Jacob Adams in [place] through [place] [facility], whichever you prefer 4. Jacob Adams: Out GI has recommended nitazoxanide for cryptosporidium, which was cost-prohibitive at discharge; if patient unable to obtain at [facility], [facility] needed 5. Jacob Adams: Please follow up biopsy results      Home Health: None  Equipment/Devices: None  Discharge Condition: Good  CODE STATUS: FULL Diet recommendation: Low sodium  Brief/Interim Summary: Jacob Adams is a 53 y.o. M with hx sCHF EF 25%, NICM, HTN, hypothyroidism, Alcohol use disorder, OSA not on CPAP, pancreatitis, PUD, anemia and smoking who presented with worsening bloody diarrhea for 4 days.  Patient had noted increased blood in stool intermittently for over a month.  Then in last few days, had new fever (so did some family members, diarrhea and fever), thought he had COVID, tested negative in the ER 2 days PTA and was discharged home.    In the ER tachycardic, hemoglobin 9.910 from baseline 13, fever to 102F, CT abdomen pelvis showed colitis.  Started on IV fluids and empiric antibiotics for infectious colitis and admitted.      PRINCIPAL HOSPITAL DIAGNOSIS: Sepsis due to suspected infectious colitis    Discharge Diagnoses:   Sepsis due to acute infectious colitis Patient febrile, tachycardic, with acute kidney injury at presentation.    CT showed diffuse wall thickening of the splenic flexure and transverse colon.  Several family members also with diarrhea, fever.  C. difficile negative.    Started on empiric azithromycin x1 followed by Cipro/Flagyl for 2 days.  Patient improved, stools no longer bloody, pain and fever resolved.  Interestingly, GI pathogen panel showed cryptosporidium (consistent with his description of >1 month intermittent diarrhea) but also E coli and Campylobacter by PCR.  HIV was checked and was negative.  GI were consulted who performed colonoscopy that showed mild to moderate patchy colonic inflammation.  Multiple biopsies were taken.  He was discharged to complete 3 days azithromycin and prescribed nitazoxanide for cryptosporidium, which was cost-prohibitive at our pharmacy    Acute blood loss anemia From colitis.  Hemoglobin stabilized at 8.2 g/dL at discharge. -Follow up Hgb in 1 week with PCP  AKI Baseline creatinine 0.9-1, presented with creatinine 1.3  Resolved with fluids.  Losartan restarted at d/c.   Hypokalemia Hypomagnesemia  Chronic systolic CHF Hypertension Appeared euvolemic at discharge.  Hypothyroidism  Alcohol use No withdrawal here.  Smoking Cessation recommended  Prediabetes A1c 5.5%, not on antiglycemics              Discharge Instructions  Discharge Instructions    Diet - low sodium heart healthy   Complete by: As directed    Discharge instructions   Complete by: As directed    From Jacob Adams: You were admitted with early sepsis and kidney injury from E coli and Campylobacter colitis.  Basically, it looks like you had food poisoning with E coli and campylobacter.  These usually resolve by themselves, but in your case it was particularly bad.  Take azithromycin 500 mg once nightly tonight and Friday night Resume all  your other home meds  For the other infection, Cryptosporidium (or "crypto") get the prescription for nitazoxanide that I sent to the [place] in [place] Take nitazoxanide 500 mg once daily for three days If the [facility] says its too expensive, or they won't fill it, ask Dr.  Eustace Adams for a referral to a GI specialist As we discussed, this one is not as severe as the E coli and Campylobacter  Call Jacob Adams in [place] and get a follow up appointment ASAP  Follow up with EITHER our GI specialists here (Jacob Adams, contact information is below) or the GI specialists at the [facility]  Because there's a small chance that something other than just an infection caused all this, you need to get the results of your biopsy from Jacob Adams If he hasn't called you with those results in 2 weeks, call his number below (in the To Do section)   Increase activity slowly   Complete by: As directed      Allergies as of 08/18/2019      Reactions   Uncoded Nonscreenable Allergen Swelling, Other (See Comments)   Sun tan lotion: swelling and peeling   Ibuprofen Nausea Only, Swelling, Other (See Comments)   "Discomfort of stomach," also   Nsaids Swelling, Other (See Comments)   "Discomfort of stomach," also   Penicillins Itching, Swelling, Rash   Has patient had a PCN reaction causing immediate rash, facial/tongue/throat swelling, SOB or lightheadedness with hypotension: yes Has patient had a PCN reaction causing severe rash involving mucus membranes or skin necrosis: no Has patient had a PCN reaction that required hospitalization yes, happened while hospitalized Has patient had a PCN reaction occurring within the last 10 years: yes If all of the above answers are "NO", then may proceed with Cephalosporin use.   [name] Other (See Comments)   DOESN'T EAT PORK- patient preference      Medication List    TAKE these medications   albuterol 108 (90 Base) MCG/ACT inhaler Commonly known as: VENTOLIN HFA Inhale 1-2 puffs into the lungs every 4 (four) hours as needed for wheezing or shortness of breath.   amLODipine 10 MG tablet Commonly known as: NORVASC Take 10 mg by mouth daily.   atorvastatin 80 MG tablet Commonly known as: LIPITOR Take 40 mg by  mouth daily.   azithromycin 500 MG tablet Commonly known as: ZITHROMAX Take 1 tablet (500 mg total) by mouth daily.   carvedilol 12.5 MG tablet Commonly known as: COREG Take 1 tablet (12.5 mg total) by mouth 2 (two) times daily with a meal.   DULoxetine 30 MG capsule Commonly known as: CYMBALTA Take 1 capsule (30 mg total) by mouth daily.   famotidine 20 MG tablet Commonly known as: PEPCID Take 1 tablet (20 mg total) by mouth 2 (two) times daily.   gabapentin 300 MG capsule Commonly known as: NEURONTIN Take 300 mg by mouth 3 (three) times daily.   levothyroxine 88 MCG tablet Commonly known as: SYNTHROID Take 88 mcg by mouth daily before breakfast.   lidocaine-prilocaine cream Commonly known as: EMLA Apply 1 application topically 2 (two) times daily as needed. Apply to both knees two times a day AS NEEDED   losartan 50 MG tablet Commonly known as: COZAAR Take 1 tablet (50 mg total) by mouth daily.   mirtazapine 15 MG tablet Commonly known as: REMERON Take 15 mg by mouth at bedtime.   nicotine polacrilex 4 MG lozenge Commonly known as: COMMIT Take 4 mg by mouth  as needed for smoking cessation (CHEW).   nitazoxanide 500 MG tablet Commonly known as: ALINIA Take 1 tablet (500 mg total) by mouth daily.   OLANZapine 20 MG tablet Commonly known as: ZYPREXA Take 20 mg by mouth daily.   pantoprazole 40 MG tablet Commonly known as: PROTONIX Take 1 tablet (40 mg total) by mouth 2 (two) times daily.   spironolactone 25 MG tablet Commonly known as: ALDACTONE Take 1 tablet (25 mg total) by mouth daily.      Follow-up Information    Sharalyn Ink, MD. Schedule an appointment as soon as possible for a visit.   Specialty: Family Medicine Why: 1 week Contact information: [address] [place] [address] [phone]        Mansouraty, Netty Starring., MD Follow up.   Specialties: Gastroenterology, Internal Medicine Contact information: [address] [place]  [address] [phone]          Allergies  Allergen Reactions  . Uncoded Nonscreenable Allergen Swelling and Other (See Comments)    Sun tan lotion: swelling and peeling  . Ibuprofen Nausea Only, Swelling and Other (See Comments)    "Discomfort of stomach," also  . Nsaids Swelling and Other (See Comments)    "Discomfort of stomach," also  . Penicillins Itching, Swelling and Rash    Has patient had a PCN reaction causing immediate rash, facial/tongue/throat swelling, SOB or lightheadedness with hypotension: yes Has patient had a PCN reaction causing severe rash involving mucus membranes or skin necrosis: no Has patient had a PCN reaction that required hospitalization yes, happened while hospitalized Has patient had a PCN reaction occurring within the last 10 years: yes If all of the above answers are "NO", then may proceed with Cephalosporin use.   . [name] Other (See Comments)    DOESN'T EAT PORK- patient preference    Consultations:  GI   Procedures/Studies: Ct Abdomen Pelvis W Contrast  Result Date: 08/15/2019 CLINICAL DATA:  Abdominal pain EXAM: CT ABDOMEN AND PELVIS WITH CONTRAST TECHNIQUE: Multidetector CT imaging of the abdomen and pelvis was performed using the standard protocol following bolus administration of intravenous contrast. CONTRAST:  OMNIPAQUE IOHEXOL 300 MG/ML  SOLN COMPARISON:  October 31, 2015 FINDINGS: Lower chest: The lung bases are clear. The heart size is normal. Hepatobiliary: The liver is normal. Normal gallbladder.There is no biliary ductal dilation. Pancreas: Normal contours without ductal dilatation. No peripancreatic fluid collection. Spleen: No splenic laceration or hematoma. Adrenals/Urinary Tract: --Adrenal glands: No adrenal hemorrhage. --Right kidney/ureter: No hydronephrosis or perinephric hematoma. --Left kidney/ureter: No hydronephrosis or perinephric hematoma. --Urinary bladder: Unremarkable. Stomach/Bowel:  --Stomach/Duodenum: No hiatal hernia or other gastric abnormality. Normal duodenal course and caliber. --Small bowel: No dilatation or inflammation. --Colon: There is diffuse wall thickening of the splenic flexure of the colon and portions of the transverse colon. --Appendix: Normal. Vascular/Lymphatic: Normal course and caliber of the major abdominal vessels. --No retroperitoneal lymphadenopathy. --No mesenteric lymphadenopathy. --No pelvic or inguinal lymphadenopathy. Reproductive: Unremarkable Other: No ascites or free air. The abdominal wall is normal. Musculoskeletal. No acute displaced fractures. IMPRESSION: Diffuse wall thickening of the splenic flexure of the colon and portions of the transverse colon, consistent with colitis, likely infectious or inflammatory. Ischemic colitis seems less likely given the relative lack of vascular disease within the abdomen. Electronically Signed   By: Katherine Mantle M.D.   On: 08/15/2019 19:12   Dg Chest Port 1 View  Result Date: 08/15/2019 CLINICAL DATA:  Shortness of breath and fever EXAM: PORTABLE  CHEST 1 VIEW COMPARISON:  August 13, 2019 FINDINGS: There are small granulomas in the left base. There is no edema or consolidation. Heart size and pulmonary vascularity are normal. No adenopathy. No bone lesions. IMPRESSION: Small granulomas left base. No edema or consolidation. Stable cardiac silhouette. Electronically Signed   By: Bretta Bang III M.D.   On: 08/15/2019 13:10   Dg Chest Portable 1 View  Result Date: 08/13/2019 CLINICAL DATA:  Shortness of breath EXAM: PORTABLE CHEST 1 VIEW COMPARISON:  02/20/2019 FINDINGS: Mild cardiomegaly. Both lungs are clear. The visualized skeletal structures are unremarkable. IMPRESSION: Mild cardiomegaly without acute abnormality of the lungs in AP portable projection. Electronically Signed   By: Lauralyn Primes M.D.   On: 08/13/2019 19:58  Colonoscopy Findings: The terminal ileum and ileocecal valve appeared  normal. Biopsies were taken with a cold forceps for histology to rule out chronic ileitis. Normal mucosa was found in the transverse colon, in the proximal transverse colon, in the ascending colon and in the cecum. Biopsies were taken with a cold forceps for histology to rule out chronic colitis and microscopic colitis. Localized moderate mucosal changes characterized by altered vascularity, congestion (edema) and granularity were found in the mid transverse colon and in the distal transverse colon. Biopsies were taken with a cold forceps for histology to rule out chronic colitis and microscopic colitis. Normal mucosa was found in the recto-sigmoid colon, in the sigmoid colon and in the descending colon. Biopsies were taken with a cold forceps for histology to rule out chronic colitis and microscopic colitis. Normal mucosa was found in the rectum. Biopsies were taken with a cold forceps for histology to rule out chronic proctitis. Non-bleeding non-thrombosed internal hemorrhoids were found during retroflexion, during perianal exam and during digital exam. The hemorrhoids were Grade II (internal hemorrhoids that prolapse but reduce spontaneously). - Hemorrhoids found on digital rectal exam. - The examined portion of the ileum was normal. Biopsied. - Normal mucosa in the transverse colon, in the proximal transverse colon, in the ascending colon and in the cecum. Biopsied. - Localized moderate mucosal changes were found in the mid transverse colon and in the distal transverse colon. Biopsied. - Normal mucosa in the recto-sigmoid colon, in the sigmoid colon and in the descending colon. Biopsied. - Normal mucosa in the rectum. Biopsied. - Non-bleeding non-thrombosed internal hemorrhoids. Impression: - The patient will be observed post-procedure, until all discharge criteria are met. - Return patient to hospital ward for ongoing care. - Patient has a contact number available for emergencies.  The signs and symptoms of potential delayed complications were discussed with the patient. Return to normal activities tomorrow. Written discharge instructions were provided to the patient. - Resume previous diet. - Use FiberCon 1 tablet PO daily. - The patient's colonoscopy is not typical for IBD - though biopsies are now pending. As discussed on consultation note from yesterday, it is now reasonable, with the findings on the GI Pathogen panel to proceed with treatment with Azithromycin and Nitazoxide x 3-days. - Follow up with [facility] and with [facility] PCP. - If patient wants follow up in [place] then he can enroll through [facility] and we would be happy to see him in the [place] office for follow up. - Follow up Pathology.      Subjective: Feeling well.  No abdominal pain. No vomiting.  Appetite good.  No more hematochezia, no diarrhea.  No fever.  Discharge Exam: Vitals:   08/18/19 1408 08/18/19 1642  BP: 117/83  108/70  Pulse: 68 82  Resp: 18 18  Temp: 98.2 F (36.8 C) 98.2 F (36.8 C)  SpO2: 100% 100%   Vitals:   08/18/19 1340 08/18/19 1343 08/18/19 1408 08/18/19 1642  BP:  126/74 117/83 108/70  Pulse:  76 68 82  Resp:  13 18 18   Temp:   98.2 F (36.8 C) 98.2 F (36.8 C)  TempSrc:   Oral Oral  SpO2: 100% 100% 100% 100%  Weight:      Height:        General: Pt is alert, awake, not in acute distress Cardiovascular: RRR, nl S1-S2, no murmurs appreciated.   No LE edema.   Respiratory: Normal respiratory rate and rhythm.  CTAB without rales or wheezes. Abdominal: Abdomen soft and non-tender.  No distension or HSM.   Neuro/Psych: Strength symmetric in upper and lower extremities.  Judgment and insight appear normal.   The results of significant diagnostics from this hospitalization (including imaging, microbiology, ancillary and laboratory) are listed below for reference.     Microbiology: Recent Results (from the past 240 hour(s))  SARS CORONAVIRUS 2 (TAT  6-24 HRS) Nasopharyngeal Nasopharyngeal Swab     Status: None   Collection Time: 08/13/19 10:38 PM   Specimen: Nasopharyngeal Swab  Result Value Ref Range Status   SARS Coronavirus 2 NEGATIVE NEGATIVE Final    Comment: (NOTE) SARS-CoV-2 target nucleic acids are NOT DETECTED. The SARS-CoV-2 RNA is generally detectable in upper and lower respiratory specimens during the acute phase of infection. Negative results do not preclude SARS-CoV-2 infection, do not rule out co-infections with other pathogens, and should not be used as the sole basis for treatment or other patient management decisions. Negative results must be combined with clinical observations, patient history, and epidemiological information. The expected result is Negative. Fact Sheet for Patients: HairSlick.no Fact Sheet for Healthcare Providers: quierodirigir.com This test is not yet approved or cleared by the [place] FDA and  has been authorized for detection and/or diagnosis of SARS-CoV-2 by FDA under an Emergency Use Authorization (EUA). This EUA will remain  in effect (meaning this test can be used) for the duration of the COVID-19 declaration under Section 56 4(b)(1) of the Act, 21 U.S.C. section 360bbb-3(b)(1), unless the authorization is terminated or revoked sooner. Performed at [facility], [address], [place], [place] [address]   Blood Culture (routine x 2)     Status: None (Preliminary result)   Collection Time: 08/15/19 11:37 AM   Specimen: BLOOD RIGHT WRIST  Result Value Ref Range Status   Specimen Description BLOOD RIGHT WRIST  Final   Special Requests   Final    BOTTLES DRAWN AEROBIC AND ANAEROBIC Blood Culture adequate volume   Culture   Final    NO GROWTH 3 DAYS Performed at [facility], [address], [place], [place] [address]    Report Status PENDING  Incomplete  SARS CORONAVIRUS 2 (TAT 6-24 HRS) Nasopharyngeal  Nasopharyngeal Swab     Status: None   Collection Time: 08/15/19 12:22 PM   Specimen: Nasopharyngeal Swab  Result Value Ref Range Status   SARS Coronavirus 2 NEGATIVE NEGATIVE Final    Comment: (NOTE) SARS-CoV-2 target nucleic acids are NOT DETECTED. The SARS-CoV-2 RNA is generally detectable in upper and lower respiratory specimens during the acute phase of infection. Negative results do not preclude SARS-CoV-2 infection, do not rule out co-infections with other pathogens, and should not be used as the sole basis for treatment or  other patient management decisions. Negative results must be combined with clinical observations, patient history, and epidemiological information. The expected result is Negative. Fact Sheet for Patients: HairSlick.no Fact Sheet for Healthcare Providers: quierodirigir.com This test is not yet approved or cleared by the [place] FDA and  has been authorized for detection and/or diagnosis of SARS-CoV-2 by FDA under an Emergency Use Authorization (EUA). This EUA will remain  in effect (meaning this test can be used) for the duration of the COVID-19 declaration under Section 56 4(b)(1) of the Act, 21 U.S.C. section 360bbb-3(b)(1), unless the authorization is terminated or revoked sooner. Performed at [facility], [address] [place], [place] [address]   Blood Culture (routine x 2)     Status: None (Preliminary result)   Collection Time: 08/15/19  1:15 PM   Specimen: BLOOD LEFT FOREARM  Result Value Ref Range Status   Specimen Description BLOOD LEFT FOREARM  Final   Special Requests   Final    BOTTLES DRAWN AEROBIC AND ANAEROBIC Blood Culture adequate volume   Culture   Final    NO GROWTH 3 DAYS Performed at [facility], [address] [place], [place] [address]    Report Status PENDING  Incomplete  Urine culture     Status: Abnormal   Collection Time: 08/15/19  2:20 PM    Specimen: In/Out Cath Urine  Result Value Ref Range Status   Specimen Description IN/OUT CATH URINE  Final   Special Requests   Final    NONE Performed at [facility], [address], [place], [place] [address]    Culture MULTIPLE SPECIES PRESENT, SUGGEST RECOLLECTION (A)  Final   Report Status 08/16/2019 FINAL  Final  GI pathogen panel by PCR, stool     Status: Abnormal   Collection Time: 08/15/19  5:05 PM   Specimen: Stool  Result Value Ref Range Status   Plesiomonas shigelloides NOT DETECTED NOT DETECTED Final   Yersinia enterocolitica NOT DETECTED NOT DETECTED Final   Vibrio NOT DETECTED NOT DETECTED Final   Enteropathogenic E coli DETECTED (A) NOT DETECTED Final   E coli (ETEC) LT/ST NOT DETECTED NOT DETECTED Final   E coli 0157 by PCR Not applicable NOT DETECTED Final   Cryptosporidium by PCR DETECTED (A) NOT DETECTED Final   Entamoeba histolytica NOT DETECTED NOT DETECTED Final   Adenovirus F 40/41 NOT DETECTED NOT DETECTED Final   Norovirus GI/GII NOT DETECTED NOT DETECTED Final   Sapovirus NOT DETECTED NOT DETECTED Final    Comment: (NOTE) Performed At: [facility] [address] [place], [place] [id number] Jolene Schimke MD IO:[id number]    Vibrio cholerae NOT DETECTED NOT DETECTED Final   Campylobacter by PCR DETECTED (A) NOT DETECTED Final   Salmonella by PCR NOT DETECTED NOT DETECTED Final   E coli (STEC) NOT DETECTED NOT DETECTED Final   Enteroaggregative E coli NOT DETECTED NOT DETECTED Final   Shigella by PCR NOT DETECTED NOT DETECTED Final   Cyclospora cayetanensis NOT DETECTED NOT DETECTED Final   Astrovirus NOT DETECTED NOT DETECTED Final   G lamblia by PCR NOT DETECTED NOT DETECTED Final   Rotavirus A by PCR NOT DETECTED NOT DETECTED Final  C difficile quick scan w PCR reflex     Status: None   Collection Time: 08/15/19  7:30 PM   Specimen: Stool  Result Value Ref Range Status   C Diff antigen NEGATIVE NEGATIVE Final   C Diff toxin  NEGATIVE NEGATIVE  Final   C Diff interpretation No C. difficile detected.  Final    Comment: Performed at [facility], [address], [place], [place] [address]     Labs: BNP (last 3 results) Recent Labs    02/08/19 0428 02/20/19 1722 08/15/19 2032  BNP 20.6 18.9 174.3*   Basic Metabolic Panel: Recent Labs  Lab 08/13/19 1750 08/15/19 1220 08/15/19 1937 08/16/19 0506 08/17/19 0518 08/18/19 0448  NA 140 135  --  137 135 137  K 3.7 2.9*  --  3.6 3.6 3.9  CL 108 106  --  111 109 111  CO2 17* 16*  --  17* 19* 18*  GLUCOSE 103* 144*  --  108* 111* 95  BUN 6 8  --  7 8 <5*  CREATININE 0.98 1.32*  --  1.02 0.96 0.98  CALCIUM 8.9 8.4*  --  7.9* 7.9* 8.1*  MG  --   --  1.5* 2.4  --   --    Liver Function Tests: Recent Labs  Lab 08/15/19 1220 08/17/19 0518  AST 29 23  ALT 20 15  ALKPHOS 52 45  BILITOT 0.5 0.4  PROT 7.4 5.7*  ALBUMIN 3.3* 2.6*   Recent Labs  Lab 08/15/19 1220  LIPASE 35   No results for input(s): AMMONIA in the last 168 hours. CBC: Recent Labs  Lab 08/15/19 1220 08/15/19 2238 08/16/19 0506 08/17/19 0518 08/18/19 0448  WBC 10.3 8.6 6.5 4.2 3.9*  NEUTROABS 8.2* 6.6  --   --   --   HGB 9.6* 9.3* 8.7* 8.1* 8.2*  HCT 30.8* 29.6* 28.6* 26.4* 26.4*  MCV 89.3 89.2 92.9 90.1 89.8  PLT 344 304 284 301 333   Hgb A1c Recent Labs    08/16/19 0500  HGBA1C 6.0*   Lipid Profile Recent Labs    08/16/19 0500  CHOL 127  HDL 48  LDLCALC 63  TRIG 81  CHOLHDL 2.6   Urinalysis    Component Value Date/Time   COLORURINE YELLOW 08/15/2019 1420   APPEARANCEUR CLEAR 08/15/2019 1420   LABSPEC 1.025 08/15/2019 1420   PHURINE 5.5 08/15/2019 1420   GLUCOSEU NEGATIVE 08/15/2019 1420   HGBUR MODERATE (A) 08/15/2019 1420   BILIRUBINUR NEGATIVE 08/15/2019 1420   KETONESUR NEGATIVE 08/15/2019 1420   PROTEINUR 100 (A) 08/15/2019 1420   UROBILINOGEN 0.2 08/25/2013 2023   NITRITE NEGATIVE 08/15/2019 1420   LEUKOCYTESUR NEGATIVE 08/15/2019 1420    Microbiology Recent Results (from the past 240 hour(s))  SARS CORONAVIRUS 2 (TAT 6-24 HRS) Nasopharyngeal Nasopharyngeal Swab     Status: None   Collection Time: 08/13/19 10:38 PM   Specimen: Nasopharyngeal Swab  Result Value Ref Range Status   SARS Coronavirus 2 NEGATIVE NEGATIVE Final    Comment: (NOTE) SARS-CoV-2 target nucleic acids are NOT DETECTED. The SARS-CoV-2 RNA is generally detectable in upper and lower respiratory specimens during the acute phase of infection. Negative results do not preclude SARS-CoV-2 infection, do not rule out co-infections with other pathogens, and should not be used as the sole basis for treatment or other patient management decisions. Negative results must be combined with clinical observations, patient history, and epidemiological information. The expected result is Negative. Fact Sheet for Patients: HairSlick.no Fact Sheet for Healthcare Providers: quierodirigir.com This test is not yet approved or cleared by the [place] FDA and  has been authorized for detection and/or diagnosis of SARS-CoV-2 by FDA under an Emergency Use Authorization (EUA). This EUA will remain  in effect (meaning  this test can be used) for the duration of the COVID-19 declaration under Section 56 4(b)(1) of the Act, 21 U.S.C. section 360bbb-3(b)(1), unless the authorization is terminated or revoked sooner. Performed at [facility], [address] [place], [place] [address]   Blood Culture (routine x 2)     Status: None (Preliminary result)   Collection Time: 08/15/19 11:37 AM   Specimen: BLOOD RIGHT WRIST  Result Value Ref Range Status   Specimen Description BLOOD RIGHT WRIST  Final   Special Requests   Final    BOTTLES DRAWN AEROBIC AND ANAEROBIC Blood Culture adequate volume   Culture   Final    NO GROWTH 3 DAYS Performed at [facility], [address] [place], [place] [address]     Report Status PENDING  Incomplete  SARS CORONAVIRUS 2 (TAT 6-24 HRS) Nasopharyngeal Nasopharyngeal Swab     Status: None   Collection Time: 08/15/19 12:22 PM   Specimen: Nasopharyngeal Swab  Result Value Ref Range Status   SARS Coronavirus 2 NEGATIVE NEGATIVE Final    Comment: (NOTE) SARS-CoV-2 target nucleic acids are NOT DETECTED. The SARS-CoV-2 RNA is generally detectable in upper and lower respiratory specimens during the acute phase of infection. Negative results do not preclude SARS-CoV-2 infection, do not rule out co-infections with other pathogens, and should not be used as the sole basis for treatment or other patient management decisions. Negative results must be combined with clinical observations, patient history, and epidemiological information. The expected result is Negative. Fact Sheet for Patients: HairSlick.no Fact Sheet for Healthcare Providers: quierodirigir.com This test is not yet approved or cleared by the [place] FDA and  has been authorized for detection and/or diagnosis of SARS-CoV-2 by FDA under an Emergency Use Authorization (EUA). This EUA will remain  in effect (meaning this test can be used) for the duration of the COVID-19 declaration under Section 56 4(b)(1) of the Act, 21 U.S.C. section 360bbb-3(b)(1), unless the authorization is terminated or revoked sooner. Performed at [facility], [address], [place], [place] [address]   Blood Culture (routine x 2)     Status: None (Preliminary result)   Collection Time: 08/15/19  1:15 PM   Specimen: BLOOD LEFT FOREARM  Result Value Ref Range Status   Specimen Description BLOOD LEFT FOREARM  Final   Special Requests   Final    BOTTLES DRAWN AEROBIC AND ANAEROBIC Blood Culture adequate volume   Culture   Final    NO GROWTH 3 DAYS Performed at [facility], [address], [place], [place] [address]    Report Status PENDING   Incomplete  Urine culture     Status: Abnormal   Collection Time: 08/15/19  2:20 PM   Specimen: In/Out Cath Urine  Result Value Ref Range Status   Specimen Description IN/OUT CATH URINE  Final   Special Requests   Final    NONE Performed at [facility], [address] [place], [place] [address]    Culture MULTIPLE SPECIES PRESENT, SUGGEST RECOLLECTION (A)  Final   Report Status 08/16/2019 FINAL  Final  GI pathogen panel by PCR, stool     Status: Abnormal   Collection Time: 08/15/19  5:05 PM   Specimen: Stool  Result Value Ref Range Status   Plesiomonas shigelloides NOT DETECTED NOT DETECTED Final   Yersinia enterocolitica NOT DETECTED NOT DETECTED Final   Vibrio NOT DETECTED NOT DETECTED Final   Enteropathogenic E coli DETECTED (  A) NOT DETECTED Final   E coli (ETEC) LT/ST NOT DETECTED NOT DETECTED Final   E coli 0157 by PCR Not applicable NOT DETECTED Final   Cryptosporidium by PCR DETECTED (A) NOT DETECTED Final   Entamoeba histolytica NOT DETECTED NOT DETECTED Final   Adenovirus F 40/41 NOT DETECTED NOT DETECTED Final   Norovirus GI/GII NOT DETECTED NOT DETECTED Final   Sapovirus NOT DETECTED NOT DETECTED Final    Comment: (NOTE) Performed At: [facility] [address] [place], [place] [id number] Jolene Schimke MD LK:[id number]    Vibrio cholerae NOT DETECTED NOT DETECTED Final   Campylobacter by PCR DETECTED (A) NOT DETECTED Final   Salmonella by PCR NOT DETECTED NOT DETECTED Final   E coli (STEC) NOT DETECTED NOT DETECTED Final   Enteroaggregative E coli NOT DETECTED NOT DETECTED Final   Shigella by PCR NOT DETECTED NOT DETECTED Final   Cyclospora cayetanensis NOT DETECTED NOT DETECTED Final   Astrovirus NOT DETECTED NOT DETECTED Final   G lamblia by PCR NOT DETECTED NOT DETECTED Final   Rotavirus A by PCR NOT DETECTED NOT DETECTED Final  C difficile quick scan w PCR reflex     Status: None   Collection Time: 08/15/19  7:30 PM   Specimen: Stool   Result Value Ref Range Status   C Diff antigen NEGATIVE NEGATIVE Final   C Diff toxin NEGATIVE NEGATIVE Final   C Diff interpretation No C. difficile detected.  Final    Comment: Performed at [facility], [address] [place], [place] [address]     Time coordinating discharge: 40 minutes      SIGNED:   Alberteen Sam, MD  [facility] 08/18/2019, 5:17 PM

## 2019-08-18 NOTE — Progress Notes (Signed)
Pt off the unit- in ENDO for colonoscopy.  Paulla Fore, RN, BSN.

## 2019-08-19 LAB — SURGICAL PATHOLOGY

## 2019-08-20 LAB — CULTURE, BLOOD (ROUTINE X 2)
Culture: NO GROWTH
Culture: NO GROWTH
Special Requests: ADEQUATE
Special Requests: ADEQUATE

## 2019-08-26 ENCOUNTER — Encounter: Payer: Self-pay | Admitting: Gastroenterology

## 2019-09-01 ENCOUNTER — Telehealth: Payer: Self-pay | Admitting: Gastroenterology

## 2019-09-01 NOTE — Progress Notes (Signed)
Thank you for trying. I have also called and left messages without avail. I think it would be best to go ahead and send another copy of the letter to the patient's address. At this point moving forward, we will have to see if they call back to see how we proceed with possible follow up here vs more likely at the [place]. Thank you. GM

## 2019-09-01 NOTE — Telephone Encounter (Signed)
[name] returned your calls. Please call pt again.

## 2019-09-01 NOTE — Telephone Encounter (Signed)
Thank you for update Patty. GM

## 2019-09-01 NOTE — Telephone Encounter (Signed)
I received a call from the pt and his wife and they confirmed that the pt is following up with the [facility] [place] in [place].

## 2019-09-09 ENCOUNTER — Telehealth: Payer: Self-pay | Admitting: Gastroenterology

## 2019-09-12 NOTE — Telephone Encounter (Signed)
I spoke with the pt and advised that I can fax the last colonoscopy report to the [place] [facility].  The pt will call back with the fax number to send the report.

## 2019-09-26 ENCOUNTER — Other Ambulatory Visit: Payer: Self-pay | Admitting: Cardiology

## 2019-09-26 DIAGNOSIS — Z20822 Contact with and (suspected) exposure to covid-19: Secondary | ICD-10-CM

## 2019-09-27 ENCOUNTER — Telehealth: Payer: Self-pay | Admitting: Gastroenterology

## 2019-09-27 LAB — NOVEL CORONAVIRUS, NAA: SARS-CoV-2, NAA: NOT DETECTED

## 2019-09-27 NOTE — Telephone Encounter (Signed)
Jacob Adams stated that she spoke to you last week and requested pt's colonoscopy report faxed to Dr. Jettie Booze at [phone]

## 2019-09-27 NOTE — Telephone Encounter (Signed)
Colon report sent to Dr Jettie Booze at the provided fax number

## 2019-09-28 ENCOUNTER — Telehealth: Payer: Self-pay

## 2019-09-28 NOTE — Telephone Encounter (Signed)
Patient called in and received his negative covid test result

## 2019-10-19 IMAGING — DX ORTHOPANTOGRAM/PANORAMIC
1 series · 1 of 1 positions shown · non-contrast
Comparison: CT scan of May 31, 2015.

CLINICAL DATA: Tooth pain, right-sided abscess.

EXAM:
ORTHOPANTOGRAM/PANORAMIC

[view not recorded]
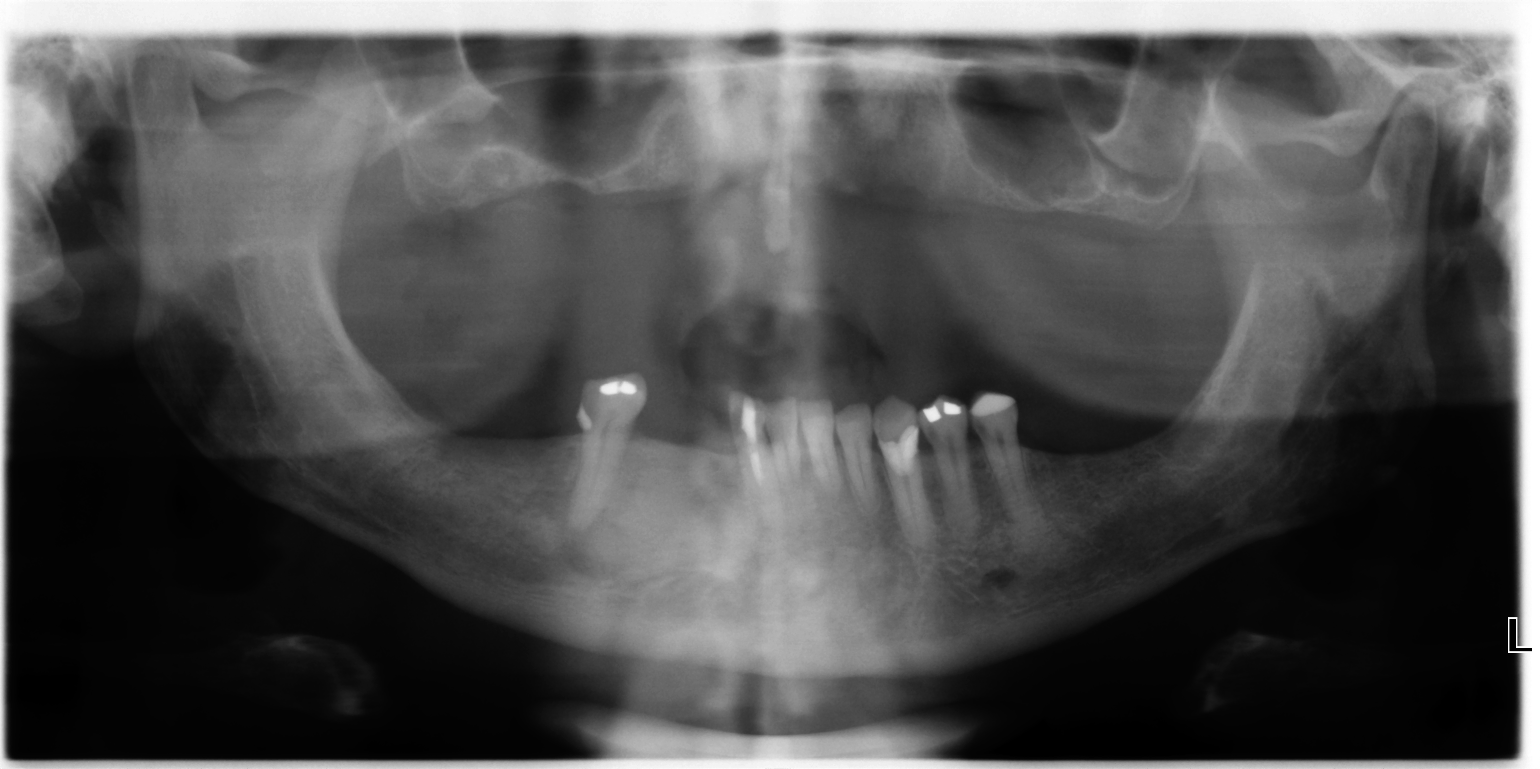

[1 of 1 positions shown; findings below may reference images not displayed]

FINDINGS: Poor dentition is noted. No fracture or dislocation is noted. No
definite lytic destruction is noted.
IMPRESSION: Poor dentition is noted. No other abnormality seen involving
visualized portion of mandible.

## 2019-10-26 ENCOUNTER — Other Ambulatory Visit: Payer: Self-pay

## 2019-10-26 ENCOUNTER — Emergency Department (HOSPITAL_COMMUNITY): Payer: No Typology Code available for payment source

## 2019-10-26 ENCOUNTER — Encounter (HOSPITAL_COMMUNITY): Payer: Self-pay | Admitting: Radiology

## 2019-10-26 ENCOUNTER — Emergency Department (HOSPITAL_COMMUNITY)
Admission: EM | Admit: 2019-10-26 | Discharge: 2019-10-27 | Disposition: A | Discharge status: Home / Self Care | Payer: No Typology Code available for payment source | Attending: Emergency Medicine | Admitting: Emergency Medicine

## 2019-10-26 DIAGNOSIS — R55 Syncope and collapse: Secondary | ICD-10-CM | POA: Insufficient documentation

## 2019-10-26 DIAGNOSIS — R10816 Epigastric abdominal tenderness: Secondary | ICD-10-CM | POA: Diagnosis not present

## 2019-10-26 DIAGNOSIS — R10811 Right upper quadrant abdominal tenderness: Secondary | ICD-10-CM | POA: Diagnosis not present

## 2019-10-26 DIAGNOSIS — I10 Essential (primary) hypertension: Secondary | ICD-10-CM | POA: Diagnosis not present

## 2019-10-26 DIAGNOSIS — I5022 Chronic systolic (congestive) heart failure: Secondary | ICD-10-CM | POA: Insufficient documentation

## 2019-10-26 DIAGNOSIS — F17210 Nicotine dependence, cigarettes, uncomplicated: Secondary | ICD-10-CM | POA: Insufficient documentation

## 2019-10-26 DIAGNOSIS — E039 Hypothyroidism, unspecified: Secondary | ICD-10-CM | POA: Insufficient documentation

## 2019-10-26 DIAGNOSIS — F319 Bipolar disorder, unspecified: Secondary | ICD-10-CM | POA: Diagnosis not present

## 2019-10-26 DIAGNOSIS — Z79899 Other long term (current) drug therapy: Secondary | ICD-10-CM | POA: Insufficient documentation

## 2019-10-26 DIAGNOSIS — R110 Nausea: Secondary | ICD-10-CM | POA: Diagnosis not present

## 2019-10-26 LAB — BASIC METABOLIC PANEL
Anion gap: 14 (ref 5–15)
BUN: 6 mg/dL (ref 6–20)
CO2: 21 mmol/L — ABNORMAL LOW (ref 22–32)
Calcium: 9 mg/dL (ref 8.9–10.3)
Chloride: 101 mmol/L (ref 98–111)
Creatinine, Ser: 1.31 mg/dL — ABNORMAL HIGH (ref 0.61–1.24)
GFR calc Af Amer: >60 mL/min (ref >60)
GFR calc non Af Amer: >60 mL/min (ref >60)
Glucose, Bld: 117 mg/dL — ABNORMAL HIGH (ref 70–99)
Potassium: 3.5 mmol/L (ref 3.5–5.1)
Sodium: 136 mmol/L (ref 135–145)

## 2019-10-26 LAB — CBC
HCT: 31.6 % — ABNORMAL LOW (ref 39.0–52.0)
Hemoglobin: 9.3 g/dL — ABNORMAL LOW (ref 13.0–17.0)
MCH: 24.7 pg — ABNORMAL LOW (ref 26.0–34.0)
MCHC: 29.4 g/dL — ABNORMAL LOW (ref 30.0–36.0)
MCV: 83.8 fL (ref 80.0–100.0)
Platelets: 232 10*3/uL (ref 150–400)
RBC: 3.77 MIL/uL — ABNORMAL LOW (ref 4.22–5.81)
RDW: 21 % — ABNORMAL HIGH (ref 11.5–15.5)
WBC: 5 10*3/uL (ref 4.0–10.5)
nRBC: 0 % (ref 0.0–0.2)

## 2019-10-26 LAB — URINALYSIS, ROUTINE W REFLEX MICROSCOPIC
Bacteria, UA: NONE SEEN
Bilirubin Urine: NEGATIVE
Glucose, UA: NEGATIVE mg/dL
Hgb urine dipstick: NEGATIVE
Ketones, ur: NEGATIVE mg/dL
Leukocytes,Ua: NEGATIVE
Nitrite: NEGATIVE
Protein, ur: 100 mg/dL — AB
Specific Gravity, Urine: 1.009 (ref 1.005–1.030)
pH: 6 (ref 5.0–8.0)

## 2019-10-26 MED ORDER — SODIUM CHLORIDE 0.9 % IV BOLUS
500.0000 mL | Freq: Once | INTRAVENOUS | Status: AC
  Administered 2019-10-27: 500 mL via INTRAVENOUS

## 2019-10-26 MED ORDER — IOHEXOL 300 MG/ML  SOLN
100.0000 mL | Freq: Once | INTRAMUSCULAR | Status: AC | PRN
  Administered 2019-10-26: 100 mL via INTRAVENOUS

## 2019-10-26 MED ORDER — SODIUM CHLORIDE 0.9% FLUSH
3.0000 mL | Freq: Once | INTRAVENOUS | Status: AC
  Administered 2019-10-27: 3 mL via INTRAVENOUS
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
## 2019-10-26 NOTE — ED Triage Notes (Signed)
Pt here via EMS, has felt weak with lightheadedness, vomiting, decreased appetite, cough, and body aches. Pt passed out at work today, lasting a few seconds. 150 CC fluid and 4 zofran given PTA. Pt a/o x4 on arrival to ED.

## 2019-10-26 NOTE — ED Notes (Signed)
This tech informed triage RN about the pt's soft pressures. This tech took 3 pressures. 88/66, 88/64 on the left. 91/65 on the right

## 2019-10-27 LAB — HEPATIC FUNCTION PANEL
ALT: 20 U/L (ref 0–44)
AST: 43 U/L — ABNORMAL HIGH (ref 15–41)
Albumin: 3.4 g/dL — ABNORMAL LOW (ref 3.5–5.0)
Alkaline Phosphatase: 47 U/L (ref 38–126)
Bilirubin, Direct: 0.3 mg/dL — ABNORMAL HIGH (ref 0.0–0.2)
Indirect Bilirubin: 0.3 mg/dL (ref 0.3–0.9)
Total Bilirubin: 0.6 mg/dL (ref 0.3–1.2)
Total Protein: 6.8 g/dL (ref 6.5–8.1)

## 2019-10-27 LAB — LIPASE, BLOOD: Lipase: 61 U/L — ABNORMAL HIGH (ref 11–51)

## 2019-10-27 LAB — CBG MONITORING, ED: Glucose-Capillary: 126 mg/dL — ABNORMAL HIGH (ref 70–99)

## 2019-10-27 NOTE — Discharge Instructions (Addendum)
Continue medications as previously prescribed.  Follow-up with your primary doctor in the next week, and return to the ER if you develop chest pain, difficulty breathing, or other new and concerning symptoms.

## 2019-11-30 ENCOUNTER — Encounter (HOSPITAL_COMMUNITY): Payer: Self-pay | Admitting: *Deleted

## 2019-11-30 ENCOUNTER — Emergency Department (HOSPITAL_COMMUNITY)
Admission: EM | Admit: 2019-11-30 | Discharge: 2019-12-01 | Disposition: A | Discharge status: Other Acute Inpatient Hospital | Payer: No Typology Code available for payment source | Attending: Emergency Medicine | Admitting: Emergency Medicine

## 2019-11-30 ENCOUNTER — Other Ambulatory Visit: Payer: Self-pay

## 2019-11-30 DIAGNOSIS — Z23 Encounter for immunization: Secondary | ICD-10-CM | POA: Diagnosis not present

## 2019-11-30 DIAGNOSIS — Z20822 Contact with and (suspected) exposure to covid-19: Secondary | ICD-10-CM | POA: Insufficient documentation

## 2019-11-30 DIAGNOSIS — D638 Anemia in other chronic diseases classified elsewhere: Secondary | ICD-10-CM | POA: Insufficient documentation

## 2019-11-30 DIAGNOSIS — R45851 Suicidal ideations: Secondary | ICD-10-CM | POA: Insufficient documentation

## 2019-11-30 DIAGNOSIS — I110 Hypertensive heart disease with heart failure: Secondary | ICD-10-CM | POA: Diagnosis not present

## 2019-11-30 DIAGNOSIS — F1010 Alcohol abuse, uncomplicated: Secondary | ICD-10-CM | POA: Insufficient documentation

## 2019-11-30 DIAGNOSIS — I5022 Chronic systolic (congestive) heart failure: Secondary | ICD-10-CM | POA: Diagnosis not present

## 2019-11-30 DIAGNOSIS — Z7289 Other problems related to lifestyle: Secondary | ICD-10-CM | POA: Insufficient documentation

## 2019-11-30 DIAGNOSIS — F329 Major depressive disorder, single episode, unspecified: Secondary | ICD-10-CM | POA: Diagnosis present

## 2019-11-30 DIAGNOSIS — E039 Hypothyroidism, unspecified: Secondary | ICD-10-CM | POA: Insufficient documentation

## 2019-11-30 DIAGNOSIS — Y905 Blood alcohol level of 100-119 mg/100 ml: Secondary | ICD-10-CM | POA: Insufficient documentation

## 2019-11-30 DIAGNOSIS — F3130 Bipolar disorder, current episode depressed, mild or moderate severity, unspecified: Secondary | ICD-10-CM | POA: Insufficient documentation

## 2019-11-30 DIAGNOSIS — F17210 Nicotine dependence, cigarettes, uncomplicated: Secondary | ICD-10-CM | POA: Diagnosis not present

## 2019-11-30 DIAGNOSIS — Z046 Encounter for general psychiatric examination, requested by authority: Secondary | ICD-10-CM | POA: Insufficient documentation

## 2019-11-30 DIAGNOSIS — Z79899 Other long term (current) drug therapy: Secondary | ICD-10-CM | POA: Insufficient documentation

## 2019-11-30 LAB — RAPID URINE DRUG SCREEN, HOSP PERFORMED
Amphetamines: NOT DETECTED
Barbiturates: NOT DETECTED
Benzodiazepines: NOT DETECTED
Cocaine: NOT DETECTED
Opiates: NOT DETECTED
Tetrahydrocannabinol: NOT DETECTED

## 2019-11-30 LAB — CBC
HCT: 25.1 % — ABNORMAL LOW (ref 39.0–52.0)
Hemoglobin: 7.6 g/dL — ABNORMAL LOW (ref 13.0–17.0)
MCH: 24 pg — ABNORMAL LOW (ref 26.0–34.0)
MCHC: 30.3 g/dL (ref 30.0–36.0)
MCV: 79.2 fL — ABNORMAL LOW (ref 80.0–100.0)
Platelets: 302 10*3/uL (ref 150–400)
RBC: 3.17 MIL/uL — ABNORMAL LOW (ref 4.22–5.81)
RDW: 21.4 % — ABNORMAL HIGH (ref 11.5–15.5)
WBC: 2.9 10*3/uL — ABNORMAL LOW (ref 4.0–10.5)
nRBC: 0 % (ref 0.0–0.2)

## 2019-11-30 LAB — COMPREHENSIVE METABOLIC PANEL
ALT: 25 U/L (ref 0–44)
AST: 39 U/L (ref 15–41)
Albumin: 3.8 g/dL (ref 3.5–5.0)
Alkaline Phosphatase: 53 U/L (ref 38–126)
Anion gap: 11 (ref 5–15)
BUN: 11 mg/dL (ref 6–20)
CO2: 24 mmol/L (ref 22–32)
Calcium: 8.9 mg/dL (ref 8.9–10.3)
Chloride: 107 mmol/L (ref 98–111)
Creatinine, Ser: 0.94 mg/dL (ref 0.61–1.24)
GFR calc Af Amer: >60 mL/min (ref >60)
GFR calc non Af Amer: >60 mL/min (ref >60)
Glucose, Bld: 106 mg/dL — ABNORMAL HIGH (ref 70–99)
Potassium: 4.6 mmol/L (ref 3.5–5.1)
Sodium: 142 mmol/L (ref 135–145)
Total Bilirubin: 0.7 mg/dL (ref 0.3–1.2)
Total Protein: 7.9 g/dL (ref 6.5–8.1)

## 2019-11-30 LAB — ACETAMINOPHEN LEVEL: Acetaminophen (Tylenol), Serum: <10 ug/mL — ABNORMAL LOW (ref 10–30)

## 2019-11-30 LAB — SALICYLATE LEVEL: Salicylate Lvl: <7 mg/dL — ABNORMAL LOW (ref 7.0–30.0)

## 2019-11-30 LAB — ETHANOL: Alcohol, Ethyl (B): 116 mg/dL — ABNORMAL HIGH (ref <10)

## 2019-11-30 LAB — RESPIRATORY PANEL BY RT PCR (FLU A&B, COVID)
Influenza A by PCR: NEGATIVE
Influenza B by PCR: NEGATIVE
SARS Coronavirus 2 by RT PCR: NEGATIVE

## 2019-11-30 MED ORDER — ALUM & MAG HYDROXIDE-SIMETH 200-200-20 MG/5ML PO SUSP: 30.0000 mL | Freq: Four times a day (QID) | ORAL | Status: DC | PRN

## 2019-11-30 MED ORDER — ACETAMINOPHEN 325 MG PO TABS: 650.0000 mg | ORAL_TABLET | ORAL | Status: DC | PRN

## 2019-11-30 MED ORDER — CARVEDILOL 12.5 MG PO TABS: 12.5000 mg | ORAL_TABLET | Freq: Two times a day (BID) | ORAL | Status: DC

## 2019-11-30 MED ORDER — LORAZEPAM 1 MG PO TABS
0.0000 mg | ORAL_TABLET | Freq: Four times a day (QID) | ORAL | Status: DC
  Administered 2019-11-30 – 2019-12-01 (×2): 1 mg via ORAL
  Filled 2019-11-30 (×2): qty 1

## 2019-11-30 MED ORDER — LORAZEPAM 2 MG/ML IJ SOLN: 0.0000 mg | Freq: Four times a day (QID) | INTRAMUSCULAR | Status: DC

## 2019-11-30 MED ORDER — LOSARTAN POTASSIUM 50 MG PO TABS
50.0000 mg | ORAL_TABLET | Freq: Every day | ORAL | Status: DC
  Filled 2019-11-30 (×2): qty 1

## 2019-11-30 MED ORDER — OLANZAPINE 10 MG PO TABS
20.0000 mg | ORAL_TABLET | Freq: Every day | ORAL | Status: DC
  Administered 2019-11-30: 20 mg via ORAL
  Filled 2019-11-30: qty 2

## 2019-11-30 MED ORDER — ONDANSETRON HCL 4 MG PO TABS: 4.0000 mg | ORAL_TABLET | Freq: Three times a day (TID) | ORAL | Status: DC | PRN

## 2019-11-30 MED ORDER — ZOLPIDEM TARTRATE 5 MG PO TABS
5.0000 mg | ORAL_TABLET | Freq: Every evening | ORAL | Status: DC | PRN
  Administered 2019-12-01: 01:00:00 5 mg via ORAL
  Filled 2019-11-30: qty 1

## 2019-11-30 MED ORDER — TETANUS-DIPHTH-ACELL PERTUSSIS 5-2.5-18.5 LF-MCG/0.5 IM SUSP
0.5000 mL | Freq: Once | INTRAMUSCULAR | Status: AC
  Administered 2019-11-30: 0.5 mL via INTRAMUSCULAR
  Filled 2019-11-30: qty 0.5

## 2019-11-30 MED ORDER — LEVOTHYROXINE SODIUM 88 MCG PO TABS
88.0000 ug | ORAL_TABLET | Freq: Every day | ORAL | Status: DC
  Administered 2019-12-01: 06:00:00 88 ug via ORAL
  Filled 2019-11-30: qty 1

## 2019-11-30 MED ORDER — NITAZOXANIDE 500 MG PO TABS: 500.0000 mg | ORAL_TABLET | Freq: Every day | ORAL | Status: DC

## 2019-11-30 MED ORDER — THIAMINE HCL 100 MG PO TABS
100.0000 mg | ORAL_TABLET | Freq: Every day | ORAL | Status: DC
  Administered 2019-11-30: 100 mg via ORAL
  Filled 2019-11-30: qty 1

## 2019-11-30 MED ORDER — LORAZEPAM 2 MG/ML IJ SOLN: 0.0000 mg | Freq: Two times a day (BID) | INTRAMUSCULAR | Status: DC

## 2019-11-30 MED ORDER — THIAMINE HCL 100 MG/ML IJ SOLN: 100.0000 mg | Freq: Every day | INTRAMUSCULAR | Status: DC

## 2019-11-30 MED ORDER — DULOXETINE HCL 30 MG PO CPEP
30.0000 mg | ORAL_CAPSULE | Freq: Every day | ORAL | Status: DC
  Administered 2019-11-30: 15:00:00 30 mg via ORAL
  Filled 2019-11-30: qty 1

## 2019-11-30 MED ORDER — LORAZEPAM 1 MG PO TABS: 0.0000 mg | ORAL_TABLET | Freq: Two times a day (BID) | ORAL | Status: DC

## 2019-11-30 MED ORDER — MIRTAZAPINE 7.5 MG PO TABS
15.0000 mg | ORAL_TABLET | Freq: Every day | ORAL | Status: DC
  Administered 2019-11-30: 15 mg via ORAL
  Filled 2019-11-30: qty 2

## 2019-11-30 MED ORDER — AMLODIPINE BESYLATE 5 MG PO TABS
10.0000 mg | ORAL_TABLET | Freq: Every day | ORAL | Status: DC
  Administered 2019-11-30: 10 mg via ORAL
  Filled 2019-11-30: qty 2

## 2019-11-30 MED ORDER — SPIRONOLACTONE 25 MG PO TABS
25.0000 mg | ORAL_TABLET | Freq: Every day | ORAL | Status: DC
  Filled 2019-11-30 (×2): qty 1
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
## 2019-11-30 NOTE — Progress Notes (Signed)
Patient meets inpatient criteria per Aldean Ast, NP. Patient has been faxed out to the following facilities for review:   [facility]  [place]  CSW will continue to follow and assist with finding bed placement.   Domenic Schwab, MSW, LCSW-A Clinical Disposition Social Worker [facility]/TTS [phone]

## 2019-11-30 NOTE — Progress Notes (Signed)
Received Jacob Adams this PM in his room with the sitter at the bedside. His mood appears to be  Distant and uninterested. He was given a snack of his choice and compliant with his night time medications. He requested a sleeping pill after 12 mn and it was effective.
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
## 2019-11-30 NOTE — Progress Notes (Signed)
Pt accepted to [facility]     Dr. Jonelle Sports is the accepting and attending provider.   Call report to [phone].     Kimberly @ [facility] ED notified.     Pt is IVC.    Pt may be transported by local law enforcement.   Pt scheduled to arrive at [facility] on 11/30/2019 after 7:00am.   Domenic Schwab, MSW, [place] Disposition Social Worker [facility]/TTS [phone]

## 2019-12-01 NOTE — ED Notes (Signed)
Report given to admit person at [facility].
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
## 2019-12-01 NOTE — ED Provider Notes (Signed)
  Physical Exam  BP (!) 127/91 (BP Location: Left Arm)   Pulse (!) 101   Temp 98.4 F (36.9 C) (Oral)   Resp 16   SpO2 96%   Physical Exam  ED Course/Procedures     Procedures  MDM  accepted at [facility] by Dr. Selinda Flavin.  Reevaluated before transfer.       Davonna Belling, MD 12/01/19 [phone]

## 2019-12-09 MED FILL — ARIPiprazole 5 MG TABS: 5 | 15 days supply | Qty: 15 | Fill #0

## 2019-12-09 MED FILL — DULoxetine HCL 30 MG CPEP: 30 | 15 days supply | Qty: 15 | Fill #0

## 2020-01-23 ENCOUNTER — Observation Stay (HOSPITAL_COMMUNITY)
Admission: EM | Admit: 2020-01-23 | Discharge: 2020-01-24 | Disposition: A | Discharge status: Home / Self Care | Payer: No Typology Code available for payment source | Attending: Student in an Organized Health Care Education/Training Program | Admitting: Student in an Organized Health Care Education/Training Program

## 2020-01-23 ENCOUNTER — Emergency Department (HOSPITAL_COMMUNITY): Payer: No Typology Code available for payment source

## 2020-01-23 ENCOUNTER — Encounter (HOSPITAL_COMMUNITY): Payer: Self-pay | Admitting: Emergency Medicine

## 2020-01-23 ENCOUNTER — Other Ambulatory Visit: Payer: Self-pay

## 2020-01-23 DIAGNOSIS — F17210 Nicotine dependence, cigarettes, uncomplicated: Secondary | ICD-10-CM | POA: Diagnosis not present

## 2020-01-23 DIAGNOSIS — F10229 Alcohol dependence with intoxication, unspecified: Secondary | ICD-10-CM | POA: Diagnosis not present

## 2020-01-23 DIAGNOSIS — I255 Ischemic cardiomyopathy: Secondary | ICD-10-CM | POA: Diagnosis not present

## 2020-01-23 DIAGNOSIS — R278 Other lack of coordination: Secondary | ICD-10-CM | POA: Diagnosis not present

## 2020-01-23 DIAGNOSIS — Z20822 Contact with and (suspected) exposure to covid-19: Secondary | ICD-10-CM | POA: Diagnosis not present

## 2020-01-23 DIAGNOSIS — K51818 Other ulcerative colitis with other complication: Secondary | ICD-10-CM

## 2020-01-23 DIAGNOSIS — Z79890 Hormone replacement therapy (postmenopausal): Secondary | ICD-10-CM | POA: Diagnosis not present

## 2020-01-23 DIAGNOSIS — I110 Hypertensive heart disease with heart failure: Secondary | ICD-10-CM | POA: Insufficient documentation

## 2020-01-23 DIAGNOSIS — I5022 Chronic systolic (congestive) heart failure: Secondary | ICD-10-CM | POA: Insufficient documentation

## 2020-01-23 DIAGNOSIS — K51911 Ulcerative colitis, unspecified with rectal bleeding: Secondary | ICD-10-CM | POA: Diagnosis present

## 2020-01-23 DIAGNOSIS — M1990 Unspecified osteoarthritis, unspecified site: Secondary | ICD-10-CM | POA: Diagnosis not present

## 2020-01-23 DIAGNOSIS — F419 Anxiety disorder, unspecified: Secondary | ICD-10-CM | POA: Diagnosis not present

## 2020-01-23 DIAGNOSIS — Z79899 Other long term (current) drug therapy: Secondary | ICD-10-CM | POA: Diagnosis not present

## 2020-01-23 DIAGNOSIS — K760 Fatty (change of) liver, not elsewhere classified: Secondary | ICD-10-CM

## 2020-01-23 DIAGNOSIS — R4182 Altered mental status, unspecified: Secondary | ICD-10-CM | POA: Diagnosis present

## 2020-01-23 DIAGNOSIS — G4730 Sleep apnea, unspecified: Secondary | ICD-10-CM | POA: Insufficient documentation

## 2020-01-23 DIAGNOSIS — E7220 Disorder of urea cycle metabolism, unspecified: Secondary | ICD-10-CM | POA: Diagnosis present

## 2020-01-23 DIAGNOSIS — Z886 Allergy status to analgesic agent status: Secondary | ICD-10-CM | POA: Insufficient documentation

## 2020-01-23 DIAGNOSIS — K7040 Alcoholic hepatic failure without coma: Secondary | ICD-10-CM | POA: Insufficient documentation

## 2020-01-23 DIAGNOSIS — K7290 Hepatic failure, unspecified without coma: Secondary | ICD-10-CM

## 2020-01-23 DIAGNOSIS — K861 Other chronic pancreatitis: Secondary | ICD-10-CM | POA: Diagnosis not present

## 2020-01-23 DIAGNOSIS — G9341 Metabolic encephalopathy: Secondary | ICD-10-CM | POA: Diagnosis present

## 2020-01-23 DIAGNOSIS — E785 Hyperlipidemia, unspecified: Secondary | ICD-10-CM | POA: Diagnosis not present

## 2020-01-23 DIAGNOSIS — F209 Schizophrenia, unspecified: Secondary | ICD-10-CM | POA: Insufficient documentation

## 2020-01-23 DIAGNOSIS — E039 Hypothyroidism, unspecified: Secondary | ICD-10-CM | POA: Diagnosis not present

## 2020-01-23 DIAGNOSIS — G312 Degeneration of nervous system due to alcohol: Secondary | ICD-10-CM | POA: Diagnosis not present

## 2020-01-23 DIAGNOSIS — K921 Melena: Secondary | ICD-10-CM

## 2020-01-23 DIAGNOSIS — F319 Bipolar disorder, unspecified: Secondary | ICD-10-CM

## 2020-01-23 DIAGNOSIS — K7682 Hepatic encephalopathy: Secondary | ICD-10-CM | POA: Diagnosis present

## 2020-01-23 DIAGNOSIS — Z880 Allergy status to penicillin: Secondary | ICD-10-CM | POA: Diagnosis not present

## 2020-01-23 DIAGNOSIS — K5190 Ulcerative colitis, unspecified, without complications: Secondary | ICD-10-CM | POA: Diagnosis present

## 2020-01-23 DIAGNOSIS — Y901 Blood alcohol level of 20-39 mg/100 ml: Secondary | ICD-10-CM | POA: Diagnosis not present

## 2020-01-23 DIAGNOSIS — I5020 Unspecified systolic (congestive) heart failure: Secondary | ICD-10-CM

## 2020-01-23 DIAGNOSIS — F1020 Alcohol dependence, uncomplicated: Secondary | ICD-10-CM | POA: Diagnosis present

## 2020-01-23 DIAGNOSIS — R410 Disorientation, unspecified: Secondary | ICD-10-CM

## 2020-01-23 LAB — COMPREHENSIVE METABOLIC PANEL
ALT: 35 U/L (ref 0–44)
ALT: 37 U/L (ref 0–44)
AST: 38 U/L (ref 15–41)
AST: 42 U/L — ABNORMAL HIGH (ref 15–41)
Albumin: 3.8 g/dL (ref 3.5–5.0)
Albumin: 3.6 g/dL (ref 3.5–5.0)
Alkaline Phosphatase: 60 U/L (ref 38–126)
Alkaline Phosphatase: 58 U/L (ref 38–126)
Anion gap: 14 (ref 5–15)
Anion gap: 11 (ref 5–15)
BUN: 7 mg/dL (ref 6–20)
BUN: 6 mg/dL (ref 6–20)
CO2: 20 mmol/L — ABNORMAL LOW (ref 22–32)
CO2: 17 mmol/L — ABNORMAL LOW (ref 22–32)
Calcium: 8.9 mg/dL (ref 8.9–10.3)
Calcium: 8.1 mg/dL — ABNORMAL LOW (ref 8.9–10.3)
Chloride: 105 mmol/L (ref 98–111)
Chloride: 110 mmol/L (ref 98–111)
Creatinine, Ser: 0.79 mg/dL (ref 0.61–1.24)
Creatinine, Ser: 0.86 mg/dL (ref 0.61–1.24)
GFR calc Af Amer: >60 mL/min (ref >60)
GFR calc Af Amer: >60 mL/min (ref >60)
GFR calc non Af Amer: >60 mL/min (ref >60)
GFR calc non Af Amer: >60 mL/min (ref >60)
Glucose, Bld: 115 mg/dL — ABNORMAL HIGH (ref 70–99)
Glucose, Bld: 97 mg/dL (ref 70–99)
Potassium: 4.2 mmol/L (ref 3.5–5.1)
Potassium: 3.3 mmol/L — ABNORMAL LOW (ref 3.5–5.1)
Sodium: 139 mmol/L (ref 135–145)
Sodium: 138 mmol/L (ref 135–145)
Total Bilirubin: 0.4 mg/dL (ref 0.3–1.2)
Total Bilirubin: 0.5 mg/dL (ref 0.3–1.2)
Total Protein: 7.3 g/dL (ref 6.5–8.1)
Total Protein: 6.6 g/dL (ref 6.5–8.1)

## 2020-01-23 LAB — RAPID URINE DRUG SCREEN, HOSP PERFORMED
Amphetamines: NOT DETECTED
Barbiturates: NOT DETECTED
Benzodiazepines: NOT DETECTED
Cocaine: NOT DETECTED
Opiates: NOT DETECTED
Tetrahydrocannabinol: NOT DETECTED

## 2020-01-23 LAB — URINALYSIS, ROUTINE W REFLEX MICROSCOPIC
Bilirubin Urine: NEGATIVE
Glucose, UA: NEGATIVE mg/dL
Hgb urine dipstick: NEGATIVE
Ketones, ur: NEGATIVE mg/dL
Leukocytes,Ua: NEGATIVE
Nitrite: NEGATIVE
Protein, ur: NEGATIVE mg/dL
Specific Gravity, Urine: 1.014 (ref 1.005–1.030)
pH: 7 (ref 5.0–8.0)

## 2020-01-23 LAB — SARS CORONAVIRUS 2 (TAT 6-24 HRS): SARS Coronavirus 2: NEGATIVE

## 2020-01-23 LAB — PROTIME-INR
INR: 1.1 (ref 0.8–1.2)
Prothrombin Time: 14.2 seconds (ref 11.4–15.2)

## 2020-01-23 LAB — CBC
HCT: 32.4 % — ABNORMAL LOW (ref 39.0–52.0)
Hemoglobin: 10.1 g/dL — ABNORMAL LOW (ref 13.0–17.0)
MCH: 25.3 pg — ABNORMAL LOW (ref 26.0–34.0)
MCHC: 31.2 g/dL (ref 30.0–36.0)
MCV: 81 fL (ref 80.0–100.0)
Platelets: 351 10*3/uL (ref 150–400)
RBC: 4 MIL/uL — ABNORMAL LOW (ref 4.22–5.81)
RDW: 24.1 % — ABNORMAL HIGH (ref 11.5–15.5)
WBC: 4.6 10*3/uL (ref 4.0–10.5)
nRBC: 0 % (ref 0.0–0.2)

## 2020-01-23 LAB — MAGNESIUM: Magnesium: 1.7 mg/dL (ref 1.7–2.4)

## 2020-01-23 LAB — AMMONIA
Ammonia: 127 umol/L — ABNORMAL HIGH (ref 9–35)
Ammonia: 38 umol/L — ABNORMAL HIGH (ref 9–35)

## 2020-01-23 LAB — LACTIC ACID, PLASMA
Lactic Acid, Venous: 3.2 mmol/L (ref 0.5–1.9)
Lactic Acid, Venous: 2.7 mmol/L (ref 0.5–1.9)

## 2020-01-23 LAB — CBG MONITORING, ED: Glucose-Capillary: 99 mg/dL (ref 70–99)

## 2020-01-23 LAB — BRAIN NATRIURETIC PEPTIDE: B Natriuretic Peptide: 5.7 pg/mL (ref 0.0–100.0)

## 2020-01-23 LAB — ETHANOL: Alcohol, Ethyl (B): 35 mg/dL — ABNORMAL HIGH (ref <10)

## 2020-01-23 LAB — PHOSPHORUS: Phosphorus: 2.9 mg/dL (ref 2.5–4.6)

## 2020-01-23 LAB — TROPONIN I (HIGH SENSITIVITY)
Troponin I (High Sensitivity): 3 ng/L (ref <18)
Troponin I (High Sensitivity): 4 ng/L (ref <18)

## 2020-01-23 LAB — LIPASE, BLOOD: Lipase: 33 U/L (ref 11–51)

## 2020-01-23 MED ORDER — LACTULOSE 10 GM/15ML PO SOLN
20.0000 g | Freq: Once | ORAL | Status: AC
  Administered 2020-01-23: 20 g via ORAL
  Filled 2020-01-23: qty 30

## 2020-01-23 MED ORDER — LORAZEPAM 1 MG PO TABS
1.0000 mg | ORAL_TABLET | ORAL | Status: DC | PRN
  Administered 2020-01-24: 2 mg via ORAL
  Filled 2020-01-23: qty 2

## 2020-01-23 MED ORDER — NICOTINE POLACRILEX 4 MG MT LOZG
4.0000 mg | LOZENGE | OROMUCOSAL | Status: DC | PRN
  Administered 2020-01-23: 4 mg via ORAL
  Filled 2020-01-23: qty 1

## 2020-01-23 MED ORDER — SPIRONOLACTONE 25 MG PO TABS
25.0000 mg | ORAL_TABLET | Freq: Every day | ORAL | Status: DC
  Administered 2020-01-23 – 2020-01-24 (×2): 25 mg via ORAL
  Filled 2020-01-23 (×3): qty 1

## 2020-01-23 MED ORDER — FOLIC ACID 1 MG PO TABS
1.0000 mg | ORAL_TABLET | Freq: Every day | ORAL | Status: DC
  Administered 2020-01-24: 1 mg via ORAL
  Filled 2020-01-23: qty 1

## 2020-01-23 MED ORDER — ADULT MULTIVITAMIN W/MINERALS CH
1.0000 | ORAL_TABLET | Freq: Every day | ORAL | Status: DC
  Administered 2020-01-24: 1 via ORAL
  Filled 2020-01-23: qty 1

## 2020-01-23 MED ORDER — THIAMINE HCL 100 MG PO TABS
100.0000 mg | ORAL_TABLET | Freq: Every day | ORAL | Status: DC
  Administered 2020-01-24: 09:00:00 100 mg via ORAL
  Filled 2020-01-23: qty 1

## 2020-01-23 MED ORDER — ENOXAPARIN SODIUM 40 MG/0.4ML ~~LOC~~ SOLN: 40.0000 mg | SUBCUTANEOUS | Status: DC

## 2020-01-23 MED ORDER — ATORVASTATIN CALCIUM 40 MG PO TABS: 40.0000 mg | ORAL_TABLET | Freq: Every day | ORAL | Status: DC

## 2020-01-23 MED ORDER — MIRTAZAPINE 15 MG PO TABS
7.5000 mg | ORAL_TABLET | Freq: Every day | ORAL | Status: DC
  Administered 2020-01-23: 7.5 mg via ORAL
  Filled 2020-01-23: qty 1

## 2020-01-23 MED ORDER — AMLODIPINE BESYLATE 5 MG PO TABS
10.0000 mg | ORAL_TABLET | Freq: Every day | ORAL | Status: DC
  Administered 2020-01-23: 10 mg via ORAL
  Filled 2020-01-23: qty 2

## 2020-01-23 MED ORDER — PANTOPRAZOLE SODIUM 40 MG PO TBEC
40.0000 mg | DELAYED_RELEASE_TABLET | Freq: Two times a day (BID) | ORAL | Status: DC
  Administered 2020-01-23 – 2020-01-24 (×2): 40 mg via ORAL
  Filled 2020-01-23 (×2): qty 1

## 2020-01-23 MED ORDER — CARVEDILOL 12.5 MG PO TABS
12.5000 mg | ORAL_TABLET | Freq: Two times a day (BID) | ORAL | Status: DC
  Administered 2020-01-24: 12.5 mg via ORAL
  Filled 2020-01-23: qty 1

## 2020-01-23 MED ORDER — GABAPENTIN 300 MG PO CAPS
300.0000 mg | ORAL_CAPSULE | Freq: Three times a day (TID) | ORAL | Status: DC
  Administered 2020-01-24 (×2): 300 mg via ORAL
  Filled 2020-01-23 (×2): qty 1

## 2020-01-23 MED ORDER — FAMOTIDINE 20 MG PO TABS
20.0000 mg | ORAL_TABLET | Freq: Two times a day (BID) | ORAL | Status: DC
  Administered 2020-01-23 – 2020-01-24 (×2): 20 mg via ORAL
  Filled 2020-01-23 (×2): qty 1

## 2020-01-23 MED ORDER — SODIUM CHLORIDE 0.9% FLUSH: 3.0000 mL | INTRAVENOUS | Status: DC | PRN

## 2020-01-23 MED ORDER — DULOXETINE HCL 30 MG PO CPEP
30.0000 mg | ORAL_CAPSULE | Freq: Every day | ORAL | Status: DC
  Administered 2020-01-23 – 2020-01-24 (×2): 30 mg via ORAL
  Filled 2020-01-23 (×4): qty 1

## 2020-01-23 MED ORDER — LORAZEPAM 2 MG/ML IJ SOLN: 1.0000 mg | INTRAMUSCULAR | Status: DC | PRN

## 2020-01-23 MED ORDER — LEVOTHYROXINE SODIUM 88 MCG PO TABS
88.0000 ug | ORAL_TABLET | Freq: Every day | ORAL | Status: DC
  Administered 2020-01-24: 88 ug via ORAL
  Filled 2020-01-23: qty 1

## 2020-01-23 MED ORDER — SODIUM CHLORIDE 0.9% FLUSH
3.0000 mL | Freq: Two times a day (BID) | INTRAVENOUS | Status: DC
  Administered 2020-01-24: 3 mL via INTRAVENOUS

## 2020-01-23 MED ORDER — LACTULOSE 10 GM/15ML PO SOLN
10.0000 g | Freq: Two times a day (BID) | ORAL | Status: DC | PRN
  Filled 2020-01-23: qty 15

## 2020-01-23 MED ORDER — LOSARTAN POTASSIUM 50 MG PO TABS
50.0000 mg | ORAL_TABLET | Freq: Every day | ORAL | Status: DC
  Administered 2020-01-24: 50 mg via ORAL
  Filled 2020-01-23: qty 1

## 2020-01-23 MED ORDER — SODIUM CHLORIDE 0.9 % IV SOLN: 250.0000 mL | INTRAVENOUS | Status: DC | PRN

## 2020-01-23 MED ORDER — ARIPIPRAZOLE 5 MG PO TABS
5.0000 mg | ORAL_TABLET | Freq: Every day | ORAL | Status: DC
  Administered 2020-01-23 – 2020-01-24 (×2): 5 mg via ORAL
  Filled 2020-01-23 (×3): qty 1
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
## 2020-01-23 NOTE — ED Notes (Signed)
Tele Dinner ordered 

## 2020-01-23 NOTE — Hospital Course (Signed)
Admitted 01/23/2020  Allergies: Uncoded nonscreenable allergen, Ibuprofen, Nsaids, Penicillins, and [name] Pertinent Hx: HTN, CHF, Alcohol use disorder with Hx of withdrawal , tobacco use, MDD, suicidal intent, HLD, hypothyroidism, Migraine disease, presented with confusion and   54 y.o. male p/w feeling confused, lightheadedness and headache  *Confusion, lightheadedness: Today is unremarkable likely in setting of hyperammonemia 127 has asterixis, LFT normal.  Received lactulose.  monitoring ammonia and CMP.  If no improvement may consider brain MRI.  *Alcohol use disorder: With history of withdrawal before: Elevated ethanol level at 35 on arrival. Last drink the night before admission. On CIWA with Ativan, thiamine and folate.  *Bloody diarrhea: Patient has similar history, mostly internal hemorrhoid.  Hemoglobin 10.1, MCV 81.  Monitor hemoglobin and may consider GI eval. no Lovenox for now.  States SCD to Lovenox if rule out active GI bleeding.  *HF: Euvolemic on exam continue home meds.  *Hypothyroidism: Continue levothyroxine  Consults: none Meds: Lactulose, folate, thiamine, VTE ppx: SCD IVF: none Diet: [place]

## 2020-01-23 NOTE — H&P (Addendum)
Date: 01/24/2020               Patient Name:  Jacob Adams MRN: [id number]  DOB: 1966/02/01 Age / Sex: 54 y.o., male   PCP: System, Pcp Not In         Medical Service: Internal Medicine Teaching Service         Attending Physician: Dr. Oswaldo Done, Marquita Palms, *    First Contact: Mcarthur Rossetti, MD, Delania Ferg Pager: SA [phone])  Second Contact: Maryla Morrow, MD, Ellie Pager: EM [phone])       After Hours (After 5p/  First Contact Pager: [phone]  weekends / holidays): Second Contact Pager: [phone]   Chief Complaint: altered mental status  History of Present Illness: Jacob Adams is a  54 y.o. male w/ PMH significant for hypertension, HFrEF (EF 25% in 2020) and ICM, hypertension, hypothyroidism, chronic pancreatitis, alcohol use disorder with history of withdrawal, tobacco use disorder, schizophrenia and bipolar disorder with prior episodes of suicidal ideation presenting with 2-3 days of confusion, frontal headache and feeling of lightheadedness/dizziness. History obtained by the patient.  Patient notes that he has had 2-3 days of frontal headache and feeling lightheaded. He notes that he first noticed this while watching TV and also noted feeling confused and disoriented. He also notes some blurry vision that is ongoing with this. He notes 2-3 day history of dry cough without any shortness of breath or chest pain. He denies any fevers or chills.  He reports generalized abdominal pain with nausea and notes 3-4 day history of loose stools; up to 3-4 stools per day that are dark brown and notes bright red blood in the stool. Last bowel movement was yesterday. He notes good appetite.  Patient reports seeing demons and monsters; however, this is unchanged from baseline. He states that these demons tell him that he is going to die but denies any current suicidal or homicidal ideation.   ED course: Patient presented via EMS for altered mental status. Patient noted to be disoriented to time and  tremulous in arrival with mid chest pain. Patient's CT Head negative for acute intracranial abnormalities. Work up significant for elevated ETOH and ammonia levels. However, LFTs wnl. Pateint given lactulose and admitted for further evaluation and management.    Meds:  Current Meds  Medication Sig  . amLODipine (NORVASC) 10 MG tablet Take 10 mg by mouth at bedtime.   . ARIPiprazole (ABILIFY) 10 MG tablet Take 5 mg by mouth daily.  Marland Kitchen atorvastatin (LIPITOR) 80 MG tablet Take 40 mg by mouth at bedtime.   . carvedilol (COREG) 12.5 MG tablet Take 1 tablet (12.5 mg total) by mouth 2 (two) times daily with a meal.  . diclofenac Sodium (VOLTAREN) 1 % GEL Apply topically 4 (four) times daily.  . DULoxetine (CYMBALTA) 30 MG capsule Take 1 capsule (30 mg total) by mouth daily.  . famotidine (PEPCID) 20 MG tablet Take 1 tablet (20 mg total) by mouth 2 (two) times daily.  Marland Kitchen gabapentin (NEURONTIN) 300 MG capsule Take 300 mg by mouth 3 (three) times daily. Take at 8am, 12 noon, and 4pm.  . levothyroxine (SYNTHROID) 88 MCG tablet Take 88 mcg by mouth daily before breakfast.  . losartan (COZAAR) 50 MG tablet Take 1 tablet (50 mg total) by mouth daily.  . mirtazapine (REMERON) 7.5 MG tablet Take 7.5 mg by mouth at bedtime.  . nicotine polacrilex (COMMIT) 4 MG lozenge Take 4 mg by mouth as needed for smoking cessation (CHEW).   Marland Kitchen  pantoprazole (PROTONIX) 40 MG tablet Take 1 tablet (40 mg total) by mouth 2 (two) times daily.  . sildenafil (VIAGRA) 100 MG tablet Take 100 mg by mouth daily as needed for erectile dysfunction.  Marland Kitchen spironolactone (ALDACTONE) 25 MG tablet Take 1 tablet (25 mg total) by mouth daily.  . traZODone (DESYREL) 100 MG tablet Take 100 mg by mouth at bedtime.   Allergies: Allergies as of 01/23/2020 - Review Complete 01/23/2020  Allergen Reaction Noted  . Uncoded nonscreenable allergen Swelling and Other (See Comments) 09/08/2011  . Ibuprofen Nausea Only, Swelling, and Other (See Comments)  10/31/2015  . Nsaids Swelling and Other (See Comments) 10/31/2015  . Penicillins Itching, Swelling, and Rash 09/08/2011  . [name] Other (See Comments) 07/24/2013   Past Medical History: Past Medical History:  Diagnosis Date  . Acid reflux   . Alcohol withdrawal (HCC) 01/2019  . Anxiety   . Arthritis    "toes" (07/26/2014)  . Bipolar disorder (HCC)   . Depression   . OZHYQMVH(846.9)    "weekly" (07/26/2014)  . History of blood transfusion ~ 2000   "related to nose bleeding"  . History of stomach ulcers   . Hypertension   . Lower GI bleeding admitted 07/26/2014  . Mental disorder   . Migraine    "@ least monthly" (07/26/2014)  . Pancreatitis   . Rectal bleeding 07/26/2014  . Sleep apnea    "haven't been RX'd mask yet" (07/26/2014)   Family History:  Family History  Problem Relation Age of Onset  . Colon cancer Mother   . CAD Mother   . Alcoholism Father   . Alcoholism Brother     Social History:  Social History   Tobacco Use  . Smoking status: Current Every Day Smoker    Packs/day: 1.00    Years: 20.00    Pack years: 20.00    Types: Cigarettes  . Smokeless tobacco: Never Used  Substance Use Topics  . Alcohol use: Yes    Comment: Drinks approx 10-40 oz beers per day  . Drug use: Not Currently    Types: "Crack" cocaine   Patient lives at home with his wife. He notes ongoing alcohol use and reports drinking at least 6 beers per day over the past few months. He also notes smoking 1-1.5 pack per day of cigarettes for similar amount of time. He notes that he previously quit alcohol and drinking but started again several months ago. He denies any illicit drug use.   Review of Systems: A complete ROS was negative except as per HPI.  Physical Exam: Blood pressure 129/87, pulse (!) 108, temperature 99 F (37.2 C), temperature source Oral, resp. rate 19, height 5\' 10"  (1.778 m), weight 116 kg, SpO2 98 %. Physical Exam Vitals reviewed.  Constitutional:       General: He is not in acute distress.    Appearance: Normal appearance. He is not ill-appearing or diaphoretic.  HENT:     Head: Normocephalic and atraumatic.     Mouth/Throat:     Mouth: Mucous membranes are moist.     Pharynx: Oropharynx is clear. No oropharyngeal exudate or posterior oropharyngeal erythema.  Eyes:     General: No scleral icterus.    Extraocular Movements: Extraocular movements intact.     Conjunctiva/sclera: Conjunctivae normal.     Pupils: Pupils are equal, round, and reactive to light.  Cardiovascular:     Rate and Rhythm: Regular rhythm. Tachycardia present.     Pulses: Normal pulses.  Heart sounds: Normal heart sounds. No murmur. No friction rub. No gallop.   Pulmonary:     Effort: Pulmonary effort is normal. No respiratory distress.     Breath sounds: Normal breath sounds. No wheezing or rales.  Abdominal:     General: Bowel sounds are normal.     Palpations: Abdomen is soft. There is no mass.     Tenderness: There is no abdominal tenderness. There is no guarding.     Hernia: No hernia is present.  Musculoskeletal:        General: No swelling, tenderness or deformity. Normal range of motion.     Cervical back: Normal range of motion and neck supple. No rigidity or tenderness.  Skin:    General: Skin is warm and dry.     Capillary Refill: Capillary refill takes less than 2 seconds.     Coloration: Skin is not jaundiced.     Findings: No bruising.  Neurological:     General: No focal deficit present.     Mental Status: He is alert and oriented to person, place, and time. Mental status is at baseline.     Cranial Nerves: No cranial nerve deficit.     Sensory: No sensory deficit.     Motor: No weakness.     Coordination: Coordination abnormal.     Comments: Bilateral asterixis noted   Psychiatric:        Mood and Affect: Mood normal.        Behavior: Behavior normal.        Judgment: Judgment normal.    CBC Latest Ref Rng & Units 01/24/2020 01/23/2020  11/30/2019  WBC 4.0 - 10.5 K/uL 5.4 4.6 2.9(L)  Hemoglobin 13.0 - 17.0 g/dL 10.3(L) 10.1(L) 7.6(L)  Hematocrit 39.0 - 52.0 % 33.1(L) 32.4(L) 25.1(L)  Platelets 150 - 400 K/uL 287 351 302   CMP Latest Ref Rng & Units 01/24/2020 01/23/2020 01/23/2020  Glucose 70 - 99 mg/dL 725(D) 97 664(Q)  BUN 6 - 20 mg/dL 7 6 7   Creatinine 0.61 - 1.24 mg/dL 0.34 7.42 5.95  Sodium 135 - 145 mmol/L 140 138 139  Potassium 3.5 - 5.1 mmol/L 3.5 3.3(L) 4.2  Chloride 98 - 111 mmol/L 105 110 105  CO2 22 - 32 mmol/L 20(L) 17(L) 20(L)  Calcium 8.9 - 10.3 mg/dL 8.9 6.3(O) 8.9  Total Protein 6.5 - 8.1 g/dL 7.1 6.6 7.3  Total Bilirubin 0.3 - 1.2 mg/dL 0.5 0.5 0.4  Alkaline Phos 38 - 126 U/L 60 58 60  AST 15 - 41 U/L 37 42(H) 38  ALT 0 - 44 U/L 32 37 35   Ammonia 9 - 35 umol/L 127High     EKG: personally reviewed my interpretation is sinus tachycardia without apparent ST segment changes, QTc 456  CT Head wo Contrast:  IMPRESSION: Negative head CT.  Assessment & Plan by Problem: Mr. Jamaury Cuddihy is a 54yr old male with history of HFrEF (EF 25% in 2020), ICM, hypertension, hyperlipidemia, hypothyroidism, schizophrenia, and chronic pancreatitis presenting with few days of altered mental status with frontal headache and lightheadedness noted to have elevated ammonia levels.   Hepatic Encephalopathy:  Patient presented with 2-3 days of confusion and disorientation. He notes that it was relatively sudden onset with confusion, disorientation and headache while he was watching TV at home. He denies any fevers, chills or recent illness. CT Head negative for acute intracranial abnormality. On examination, patient alert and oriented to self, time, location and situation. No obvious neurologic deficits  noted on examination but did have significant asterixis with outstretched hands. On CT Abdomen Pelvis in March 2021, patient noted to have hepatic steatosis and early cirrhotic changes which is new from CT Abdomen Pelvis in January  2021. He received one dose of lactulose in the ED.  - Monitor CMP, PT/INR - May need repeat lactulose dosing in AM  Alcohol use: Patient with history of extensive alcohol use. He notes drinking extensively over the past several months with up to 6 beers per day. Patient is tachycardic with headache but has ETOH level 35 with last drink being last night around 9:30pm. Doubt that patient is currently withdrawing. - CIWA w/Ativan - Folic acid 1mg  daily  - Multivitamin 1 tablet daily - Thiamine 100mg  daily  Hematochezia: Hx of ulcerative colitis Patient notes that he has loose stools over past several days with 3-4 stools that are brown but also notes bright red blood in the stool. He reports last bowel movement to be yesterday. On chart review, some concern for history of cirrhosis for which he is on spironolactone. Patient had colonoscopy in October 2020 which was significant for nonthrombosed internal hemorrhoids. Suspect that is likely source of bleeding. Although slightly tachycardic, BP is stable. He is afebrile. No leukocytosis noted. Hb stable at 10, increased from 7.6 on February.  - CBC daily  - Patient will likely need referral for outpatient GI work up  Hx of HFrEF (EF 25% in 2020): Hypertension:  Patient with history of HFrEF and ischemic cardiomyopathy with EF 25% in 2020.  - Amlodipine 10mg  daily - Carvedilol 12.5mg  bid - Spironolactone 25mg  daily   Hypothyroidism: - Levothyroxine 88mg  daily  Hyperlipidemia - Lipitor 40mg  daily   Schizophrenia: Hx of MDD w/suicidal ideation:  History of schizophrenia and reports ongoing visual and auditory hallucinations. However, no suicidal or homicidal ideation. - Abilify 5mg  daily - Mirtazapine 7.5mg  daily qHS - Duloxetine 30mg  daily   FEN/GI:  Diet: Heart healthy  Electrolytes:Monitor and replete prn Fluids: None   DVT Prophylaxis: SCDs Code status: FULL code   Dispo: Admit patient to Observation with expected length of  stay less than 2 midnights.  Signed: Eliezer Bottom, MD  Internal Medicine, PGY-1 01/24/2020, 11:27 AM  Pager: [phone]

## 2020-01-23 NOTE — ED Triage Notes (Signed)
Per [facility] pt coming from home after significant other called for alerted mental status. Reports patient was last normal at 9:30 last night. Patient disorientated to time and patient is shaky on arrival. Patient c/o mid chest pain, unsure of onset.

## 2020-01-24 DIAGNOSIS — G9341 Metabolic encephalopathy: Secondary | ICD-10-CM | POA: Diagnosis present

## 2020-01-24 DIAGNOSIS — K7682 Hepatic encephalopathy: Secondary | ICD-10-CM | POA: Diagnosis present

## 2020-01-24 DIAGNOSIS — K7290 Hepatic failure, unspecified without coma: Secondary | ICD-10-CM | POA: Diagnosis present

## 2020-01-24 LAB — CBC
HCT: 33.1 % — ABNORMAL LOW (ref 39.0–52.0)
Hemoglobin: 10.3 g/dL — ABNORMAL LOW (ref 13.0–17.0)
MCH: 25.4 pg — ABNORMAL LOW (ref 26.0–34.0)
MCHC: 31.1 g/dL (ref 30.0–36.0)
MCV: 81.5 fL (ref 80.0–100.0)
Platelets: 287 10*3/uL (ref 150–400)
RBC: 4.06 MIL/uL — ABNORMAL LOW (ref 4.22–5.81)
RDW: 24.6 % — ABNORMAL HIGH (ref 11.5–15.5)
WBC: 5.4 10*3/uL (ref 4.0–10.5)
nRBC: 0 % (ref 0.0–0.2)

## 2020-01-24 LAB — PROTIME-INR
INR: 1.1 (ref 0.8–1.2)
Prothrombin Time: 14.1 seconds (ref 11.4–15.2)

## 2020-01-24 LAB — COMPREHENSIVE METABOLIC PANEL
ALT: 32 U/L (ref 0–44)
AST: 37 U/L (ref 15–41)
Albumin: 3.7 g/dL (ref 3.5–5.0)
Alkaline Phosphatase: 60 U/L (ref 38–126)
Anion gap: 15 (ref 5–15)
BUN: 7 mg/dL (ref 6–20)
CO2: 20 mmol/L — ABNORMAL LOW (ref 22–32)
Calcium: 8.9 mg/dL (ref 8.9–10.3)
Chloride: 105 mmol/L (ref 98–111)
Creatinine, Ser: 0.88 mg/dL (ref 0.61–1.24)
GFR calc Af Amer: >60 mL/min (ref >60)
GFR calc non Af Amer: >60 mL/min (ref >60)
Glucose, Bld: 106 mg/dL — ABNORMAL HIGH (ref 70–99)
Potassium: 3.5 mmol/L (ref 3.5–5.1)
Sodium: 140 mmol/L (ref 135–145)
Total Bilirubin: 0.5 mg/dL (ref 0.3–1.2)
Total Protein: 7.1 g/dL (ref 6.5–8.1)

## 2020-01-24 LAB — URINE CULTURE: Culture: NO GROWTH

## 2020-01-24 LAB — AMMONIA: Ammonia: 52 umol/L — ABNORMAL HIGH (ref 9–35)

## 2020-01-24 MED ORDER — LACTULOSE 20 GM/30ML PO SOLN: 20.0000 g | Freq: Every day | ORAL | 0 refills | Status: DC

## 2020-01-24 MED ORDER — NALTREXONE HCL 50 MG PO TABS: 50.0000 mg | ORAL_TABLET | Freq: Every day | ORAL | 0 refills | Status: DC

## 2020-01-24 NOTE — Discharge Summary (Signed)
Name: Jacob Adams MRN: [id number] DOB: [birth year] 54 y.o. PCP: System, Pcp Not In  Date of Admission: 01/23/2020 10:29 AM Date of Discharge: 01/24/20 Attending Physician: Dr. Erlinda Hong  Discharge Diagnosis: 1. Hepatic encephalopathy 2. Ulcerative Colitis 3. EtOH use disorder, severe, with dependence  Discharge Medications: Allergies as of 01/24/2020      Reactions   Uncoded Nonscreenable Allergen Swelling, Other (See Comments)   Sun tan lotion: swelling and peeling   Ibuprofen Nausea Only, Swelling, Other (See Comments)   "Discomfort of stomach," also   Nsaids Swelling, Other (See Comments)   "Discomfort of stomach," also   Penicillins Itching, Swelling, Rash   Has patient had a PCN reaction causing immediate rash, facial/tongue/throat swelling, SOB or lightheadedness with hypotension: yes Has patient had a PCN reaction causing severe rash involving mucus membranes or skin necrosis: no Has patient had a PCN reaction that required hospitalization yes, happened while hospitalized Has patient had a PCN reaction occurring within the last 10 years: yes If all of the above answers are "NO", then may proceed with Cephalosporin use.   [name] Other (See Comments)   DOESN'T EAT PORK- patient preference      Medication List    STOP taking these medications   nitazoxanide 500 MG tablet Commonly known as: ALINIA     TAKE these medications   amLODipine 10 MG tablet Commonly known as: NORVASC Take 10 mg by mouth at bedtime.   ARIPiprazole 10 MG tablet Commonly known as: ABILIFY Take 5 mg by mouth daily.   atorvastatin 80 MG tablet Commonly known as: LIPITOR Take 40 mg by mouth at bedtime.   carvedilol 12.5 MG tablet Commonly known as: COREG Take 1 tablet (12.5 mg total) by mouth 2 (two) times daily with a meal.   diclofenac Sodium 1 % Gel Commonly known as: VOLTAREN Apply topically 4 (four) times daily.   DULoxetine 30 MG capsule Commonly known as:  CYMBALTA Take 1 capsule (30 mg total) by mouth daily.   famotidine 20 MG tablet Commonly known as: PEPCID Take 1 tablet (20 mg total) by mouth 2 (two) times daily.   gabapentin 300 MG capsule Commonly known as: NEURONTIN Take 300 mg by mouth 3 (three) times daily. Take at 8am, 12 noon, and 4pm.   Lactulose 20 GM/30ML Soln Take 30 mLs (20 g total) by mouth daily. For goal of 2-3 bowel movements per day   levothyroxine 88 MCG tablet Commonly known as: SYNTHROID Take 88 mcg by mouth daily before breakfast.   losartan 50 MG tablet Commonly known as: COZAAR Take 1 tablet (50 mg total) by mouth daily.   mirtazapine 7.5 MG tablet Commonly known as: REMERON Take 7.5 mg by mouth at bedtime.   naltrexone 50 MG tablet Commonly known as: DEPADE Take 1 tablet (50 mg total) by mouth daily.   nicotine polacrilex 4 MG lozenge Commonly known as: COMMIT Take 4 mg by mouth as needed for smoking cessation (CHEW).   pantoprazole 40 MG tablet Commonly known as: PROTONIX Take 1 tablet (40 mg total) by mouth 2 (two) times daily.   sildenafil 100 MG tablet Commonly known as: VIAGRA Take 100 mg by mouth daily as needed for erectile dysfunction.   spironolactone 25 MG tablet Commonly known as: ALDACTONE Take 1 tablet (25 mg total) by mouth daily.   traZODone 100 MG tablet Commonly known as: DESYREL Take 100 mg by mouth at bedtime.      Disposition and follow-up:   Jacob Adams  [name] was discharged from [facility] in Stable condition.  At the hospital follow up visit please address:  1.  Hepatic encephalopathy: Discharged with Lactulose, please make sure he has been able to pick this up and he is having 2-3 BM/day  Ulcerative Colitis: Needs to follow-up with his PCP and GI at the [place]  EtOH use disorder, severe, with dependence: Discharged with naltrexone prescription, please discuss effectiveness and affordability. Also referred to treatment facility.   2.  Labs /  imaging needed at time of follow-up: CBC. CMP  3.  Pending labs/ test needing follow-up: n/a  Follow-up Appointments: Discussed with significant other - patient to follow up with PCP at [facility] and GI   Hospital Course by problem list: Acute encephalopathy:  Mr. Jacob Adams is a 54yr old male with history of HFrEF (EF 25% in 2020), ICM, hypertension, hyperlipidemia, hypothyroidism, ulcerative colitis, schizophrenia, and chronic pancreatitis presenting with few days of altered mental status with frontal headache and lightheadedness noted to have elevated ammonia levels. This occurred in the setting of a recent CT demonstrating early cirrhosis with hepatic nodularity although his synthetic liver function is intact. Etiology ultimately determined to be multifactorial as he was also intoxicated with an elevated EtOH level. This improved with appropriate treatment with a CIWA of zero at discharge. Will recommend treatment of hepatic encephalopathy with lactulose and EtOH use disorder with a detox center and Naltrexone.    Hematochezia: Hx of ulcerative colitis:  Patient reported bright red blood with loose stools. Colonoscopy and biopsy from 2020 diagnostic of ulcerative colitis and nonthrombosed internal hemorrhoids. Recent admission to [facility] found to have campylobacter and E.coli infection and received antibiotic treatment and mesalamine. Recommended outpatient GI evaluation at that time. Patient has not followed up with GI yet. Hemoglobin was stable but low at ~10 during this admission. Will need to see GI and his PCP.   Discharge Vitals:   BP 127/85 (BP Location: Right Arm)   Pulse 94   Temp 98.7 F (37.1 C) (Oral)   Resp 16   Ht 5\' 10"  (1.778 m)   Wt 116 kg   SpO2 100%   BMI 36.69 kg/m   Pertinent Labs, Studies, and Procedures:  CMP Latest Ref Rng & Units 01/24/2020 01/23/2020 01/23/2020  Glucose 70 - 99 mg/dL 161(W) 97 960(A)  BUN 6 - 20 mg/dL 7 6 7   Creatinine 0.61 - 1.24 mg/dL 5.40  9.81 1.91  Sodium 135 - 145 mmol/L 140 138 139  Potassium 3.5 - 5.1 mmol/L 3.5 3.3(L) 4.2  Chloride 98 - 111 mmol/L 105 110 105  CO2 22 - 32 mmol/L 20(L) 17(L) 20(L)  Calcium 8.9 - 10.3 mg/dL 8.9 4.7(W) 8.9  Total Protein 6.5 - 8.1 g/dL 7.1 6.6 7.3  Total Bilirubin 0.3 - 1.2 mg/dL 0.5 0.5 0.4  Alkaline Phos 38 - 126 U/L 60 58 60  AST 15 - 41 U/L 37 42(H) 38  ALT 0 - 44 U/L 32 37 35   CBC Latest Ref Rng & Units 01/24/2020 01/23/2020 11/30/2019  WBC 4.0 - 10.5 K/uL 5.4 4.6 2.9(L)  Hemoglobin 13.0 - 17.0 g/dL 10.3(L) 10.1(L) 7.6(L)  Hematocrit 39.0 - 52.0 % 33.1(L) 32.4(L) 25.1(L)  Platelets 150 - 400 K/uL 287 351 302   Urinalysis    Component Value Date/Time   COLORURINE YELLOW 01/23/2020 1152   APPEARANCEUR CLEAR 01/23/2020 1152   LABSPEC 1.014 01/23/2020 1152   PHURINE 7.0 01/23/2020 1152   GLUCOSEU NEGATIVE 01/23/2020  1152   HGBUR NEGATIVE 01/23/2020 1152   BILIRUBINUR NEGATIVE 01/23/2020 1152   KETONESUR NEGATIVE 01/23/2020 1152   PROTEINUR NEGATIVE 01/23/2020 1152   UROBILINOGEN 0.2 08/25/2013 2023   NITRITE NEGATIVE 01/23/2020 1152   LEUKOCYTESUR NEGATIVE 01/23/2020 1152    CT Head wo Contrast 01/23/2020: IMPRESSION: Negative head CT.  Discharge Instructions: Discharge Instructions    Call MD for:  difficulty breathing, headache or visual disturbances   Complete by: As directed    Call MD for:  extreme fatigue   Complete by: As directed    Call MD for:  hives   Complete by: As directed    Call MD for:  persistant dizziness or light-headedness   Complete by: As directed    Call MD for:  persistant nausea and vomiting   Complete by: As directed    Call MD for:  severe uncontrolled pain   Complete by: As directed    Call MD for:  temperature >100.4   Complete by: As directed    Diet - low sodium heart healthy   Complete by: As directed    Discharge instructions   Complete by: As directed    Mr. Neill, Rowan were admitted to the hospital with confusion and  disorientation with headaches in setting of liver cirrhosis. You were treated with lactulose. We discussed alcohol cessation to help with your liver cirrhosis. On discharge, please start taking the following medications: Lactulose daily with goal of 2-3 bowel movements per day  Naltrexone 50mg  daily (this is to help decrease your alcohol cravings) Per your wife's request, I have sent a referral to [facility] in [place], [place].  Please schedule a visit to follow up with your PCP and the GI doctor.   For your bloody bowel movements, you were noted to have history of ulcerative colitis and internal hemorrhoids on colonoscopy in 2020. Your hemoglobin levels were stable during this admission and you did not require any transfusion. Please schedule a follow up visit with the GI doctor for further management of this.   Thank you!   Increase activity slowly   Complete by: As directed       Signed: Eliezer Bottom, MD  Internal Medicine, PGY-1 01/27/2020, 8:47 AM

## 2020-01-24 NOTE — Discharge Planning (Signed)
Wife, Bethena Roys called regarding referral to [facility] for alcoholism.

## 2020-01-24 NOTE — Progress Notes (Addendum)
Subjective: HD#1 Overnight, no acute events reported.  This morning patient evaluated at bedside. He is sitting in chair at bedside and does not appear to be in acute distress. He reports confusion is better this morning. He does continue to have loose bowel movements and notes ongoing hematochezia.   Objective:  Vital signs in last 24 hours: Vitals:   01/23/20 2230 01/24/20 0130 01/24/20 0600 01/24/20 0914  BP: (!) 127/98 129/78 129/87   Pulse: (!) 50 (!) 113 (!) 118 (!) 108  Resp: 15 17 19    Temp:      TempSrc:      SpO2: 99% 95% 98%   Weight:      Height:       CBC Latest Ref Rng & Units 01/24/2020 01/23/2020 11/30/2019  WBC 4.0 - 10.5 K/uL 5.4 4.6 2.9(L)  Hemoglobin 13.0 - 17.0 g/dL 10.3(L) 10.1(L) 7.6(L)  Hematocrit 39.0 - 52.0 % 33.1(L) 32.4(L) 25.1(L)  Platelets 150 - 400 K/uL 287 351 302   BMP Latest Ref Rng & Units 01/24/2020 01/23/2020 01/23/2020  Glucose 70 - 99 mg/dL 106(H) 97 115(H)  BUN 6 - 20 mg/dL 7 6 7   Creatinine 0.61 - 1.24 mg/dL 0.88 0.86 0.79  Sodium 135 - 145 mmol/L 140 138 139  Potassium 3.5 - 5.1 mmol/L 3.5 3.3(L) 4.2  Chloride 98 - 111 mmol/L 105 110 105  CO2 22 - 32 mmol/L 20(L) 17(L) 20(L)  Calcium 8.9 - 10.3 mg/dL 8.9 8.1(L) 8.9    Physical Exam  Constitutional: He is oriented to person, place, and time and well-developed, well-nourished, and in no distress. No distress.  HENT:  Head: Normocephalic and atraumatic.  Eyes: EOM are normal.  Cardiovascular: Normal rate and regular rhythm. Exam reveals no gallop and no friction rub.  No murmur heard. Pulmonary/Chest: Effort normal and breath sounds normal. No respiratory distress. He has no wheezes. He has no rales.  Abdominal: Soft. Bowel sounds are normal. He exhibits no distension. There is no abdominal tenderness. There is no rebound.  Musculoskeletal:        General: Normal range of motion.  Neurological: He is alert and oriented to person, place, and time. Gait normal.  Mild asterixis, improved  from prior exam  Skin: He is diaphoretic.   Assessment/Plan: Mr. Jacob Adams is a 54yrold male with history of HFrEF (EF 25% in 2020), ICM, hypertension, hyperlipidemia, hypothyroidism, schizophrenia, and chronic pancreatitis presenting with few days of altered mental status with frontal headache and lightheadedness noted to have elevated ammonia levels.   Hepatic encephalopathy Alcohol intoxication Patient presented with confusion and disorientation with frontal headache and lightheadedness with asterixis concerning for hepatic encephalopathy with recent CT abdomen from outside hospital showing early cirrhosis with hepatic nodularity. LFTs wnl and synthetic function of liver intact. Patient received one dose lactulose with improvement of symptoms. Asterixis improved on examination.  Noted to have elevated ETOH on exam with last drink being the night prior. He does have history of withdrawal but none recently. CIWA overnight remains 0. No ativan required. Patient interested in alcohol cessation and interested in starting naltrexone. - Naltrexone 519mdaily - Lactulose 2059maily for goal of 2-3 BM per day - F/u with PCP  Hematochezia: Hx of ulcerative colitis:  Patient reports bright red blood with loose stools. Colonoscopy and biopsy from 2020 diagnostic of ulcerative colitis and nonthrombosed internal hemorrhoids. [facility] to have campylobacter and E.coli infection and received antibiotic treatment and mesalamine. Recommended for  outpatient GI evaluation. Patient has not followed up with GI yet. Hb is stable from yesterday at 10 and patient is hemodynamically stable.  - Continue to monitor - F/u with GI as outpatient  - Continue mesalamine on discharge   FEN/GI:  Diet: Heart healthy  Fluids: None   DVT Prophylaxis: SCDs Code status: FULL code  Prior to Admission Living Arrangement: Home Anticipated Discharge Location: Home Barriers to Discharge:  None  Dispo: Anticipated discharge in approximately 0-1 day(s).  Discussed patient's hospitalization and discharge planning with wife. She is requesting referral to [facility] in [place], [place]. Contacted the rehab center. Will fax discharge summary to [facility] for review and they will contact patient based on bed availability.   Harvie Heck, MD  Internal Medicine, PGY-1 01/24/2020, 11:41 AM Pager: [phone]

## 2020-01-24 NOTE — ED Notes (Signed)
Lunch Tray Ordered @ 4497.

## 2020-01-24 NOTE — ED Notes (Signed)
Breakfast Ordered 

## 2020-01-24 NOTE — ED Notes (Signed)
Lunch Tray Ordered @ 4210.

## 2020-02-01 ENCOUNTER — Telehealth: Payer: Self-pay | Admitting: Surgery

## 2020-02-01 NOTE — Telephone Encounter (Signed)
ED CM received call from patient and spouse regarding discharge information not forwarded to PCP at the [place].  CM reviewed information and forwarded AVS to the [place],  no further ED CM needs identified.

## 2020-02-06 ENCOUNTER — Telehealth: Payer: Self-pay | Admitting: *Deleted

## 2020-02-06 NOTE — Telephone Encounter (Signed)
TOC CM received call from wife stating PCP office has not received dc summary. Faxed dc summary to [facility], Attn Dr Rutherford # [phone]. [place], [place] ED TOC CM [phone]

## 2020-06-29 ENCOUNTER — Ambulatory Visit: Payer: Medicaid Other | Admitting: Internal Medicine

## 2020-06-29 ENCOUNTER — Encounter: Payer: Self-pay | Admitting: Internal Medicine

## 2020-06-29 VITALS — BP 128/88 | HR 70 | Resp 12 | Ht 67.0 in | Wt 263.0 lb

## 2020-06-29 DIAGNOSIS — F333 Major depressive disorder, recurrent, severe with psychotic symptoms: Secondary | ICD-10-CM

## 2020-06-29 DIAGNOSIS — K51911 Ulcerative colitis, unspecified with rectal bleeding: Secondary | ICD-10-CM

## 2020-06-29 DIAGNOSIS — E785 Hyperlipidemia, unspecified: Secondary | ICD-10-CM

## 2020-06-29 DIAGNOSIS — K029 Dental caries, unspecified: Secondary | ICD-10-CM

## 2020-06-29 DIAGNOSIS — E039 Hypothyroidism, unspecified: Secondary | ICD-10-CM

## 2020-06-29 DIAGNOSIS — G4700 Insomnia, unspecified: Secondary | ICD-10-CM

## 2020-06-29 DIAGNOSIS — F1020 Alcohol dependence, uncomplicated: Secondary | ICD-10-CM

## 2020-06-29 DIAGNOSIS — Z79899 Other long term (current) drug therapy: Secondary | ICD-10-CM

## 2020-06-29 DIAGNOSIS — I10 Essential (primary) hypertension: Secondary | ICD-10-CM

## 2020-06-29 DIAGNOSIS — I5022 Chronic systolic (congestive) heart failure: Secondary | ICD-10-CM

## 2020-06-29 DIAGNOSIS — E669 Obesity, unspecified: Secondary | ICD-10-CM
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
## 2020-06-30 LAB — CBC WITH DIFFERENTIAL/PLATELET
Basophils Absolute: 0 10*3/uL (ref 0.0–0.2)
Basos: 1 %
EOS (ABSOLUTE): 0.2 10*3/uL (ref 0.0–0.4)
Eos: 4 %
Hematocrit: 37.3 % — ABNORMAL LOW (ref 37.5–51.0)
Hemoglobin: 12.3 g/dL — ABNORMAL LOW (ref 13.0–17.7)
Immature Grans (Abs): 0 10*3/uL (ref 0.0–0.1)
Immature Granulocytes: 1 %
Lymphocytes Absolute: 1.6 10*3/uL (ref 0.7–3.1)
Lymphs: 38 %
MCH: 30.8 pg (ref 26.6–33.0)
MCHC: 33 g/dL (ref 31.5–35.7)
MCV: 93 fL (ref 79–97)
Monocytes Absolute: 0.5 10*3/uL (ref 0.1–0.9)
Monocytes: 12 %
Neutrophils Absolute: 1.9 10*3/uL (ref 1.4–7.0)
Neutrophils: 44 %
Platelets: 237 10*3/uL (ref 150–450)
RBC: 4 x10E6/uL — ABNORMAL LOW (ref 4.14–5.80)
RDW: 17.7 % — ABNORMAL HIGH (ref 11.6–15.4)
WBC: 4.2 10*3/uL (ref 3.4–10.8)

## 2020-06-30 LAB — TSH: TSH: 0.33 u[IU]/mL — ABNORMAL LOW (ref 0.450–4.500)

## 2020-06-30 LAB — COMPREHENSIVE METABOLIC PANEL
ALT: 22 IU/L (ref 0–44)
AST: 22 IU/L (ref 0–40)
Albumin/Globulin Ratio: 1.5 (ref 1.2–2.2)
Albumin: 4.4 g/dL (ref 3.8–4.9)
Alkaline Phosphatase: 66 IU/L (ref 48–121)
BUN/Creatinine Ratio: 10 (ref 9–20)
BUN: 10 mg/dL (ref 6–24)
Bilirubin Total: 0.3 mg/dL (ref 0.0–1.2)
CO2: 25 mmol/L (ref 20–29)
Calcium: 8.8 mg/dL (ref 8.7–10.2)
Chloride: 100 mmol/L (ref 96–106)
Creatinine, Ser: 0.97 mg/dL (ref 0.76–1.27)
GFR calc Af Amer: 101 mL/min/{1.73_m2} (ref >59)
GFR calc non Af Amer: 88 mL/min/{1.73_m2} (ref >59)
Globulin, Total: 3 g/dL (ref 1.5–4.5)
Glucose: 89 mg/dL (ref 65–99)
Potassium: 4 mmol/L (ref 3.5–5.2)
Sodium: 141 mmol/L (ref 134–144)
Total Protein: 7.4 g/dL (ref 6.0–8.5)

## 2020-06-30 LAB — HGB A1C W/O EAG: Hgb A1c MFr Bld: 6 % — ABNORMAL HIGH (ref 4.8–5.6)

## 2020-06-30 LAB — LIPID PANEL W/O CHOL/HDL RATIO
Cholesterol, Total: 142 mg/dL (ref 100–199)
HDL: 58 mg/dL (ref >39)
LDL Chol Calc (NIH): 52 mg/dL (ref 0–99)
Triglycerides: 202 mg/dL — ABNORMAL HIGH (ref 0–149)
VLDL Cholesterol Cal: 32 mg/dL (ref 5–40)

## 2020-07-08 ENCOUNTER — Encounter (HOSPITAL_COMMUNITY): Payer: Self-pay | Admitting: Emergency Medicine

## 2020-07-08 ENCOUNTER — Emergency Department (HOSPITAL_COMMUNITY): Payer: No Typology Code available for payment source

## 2020-07-08 ENCOUNTER — Emergency Department (HOSPITAL_COMMUNITY)
Admission: EM | Admit: 2020-07-08 | Discharge: 2020-07-09 | Disposition: A | Discharge status: Home / Self Care | Payer: No Typology Code available for payment source | Attending: Emergency Medicine | Admitting: Emergency Medicine

## 2020-07-08 DIAGNOSIS — F17210 Nicotine dependence, cigarettes, uncomplicated: Secondary | ICD-10-CM | POA: Diagnosis not present

## 2020-07-08 DIAGNOSIS — F209 Schizophrenia, unspecified: Secondary | ICD-10-CM | POA: Insufficient documentation

## 2020-07-08 DIAGNOSIS — R519 Headache, unspecified: Secondary | ICD-10-CM

## 2020-07-08 DIAGNOSIS — I110 Hypertensive heart disease with heart failure: Secondary | ICD-10-CM | POA: Diagnosis not present

## 2020-07-08 DIAGNOSIS — E039 Hypothyroidism, unspecified: Secondary | ICD-10-CM | POA: Insufficient documentation

## 2020-07-08 DIAGNOSIS — Z79899 Other long term (current) drug therapy: Secondary | ICD-10-CM | POA: Diagnosis not present

## 2020-07-08 DIAGNOSIS — I5022 Chronic systolic (congestive) heart failure: Secondary | ICD-10-CM | POA: Diagnosis not present

## 2020-07-08 DIAGNOSIS — Z0279 Encounter for issue of other medical certificate: Secondary | ICD-10-CM | POA: Insufficient documentation

## 2020-07-08 DIAGNOSIS — R062 Wheezing: Secondary | ICD-10-CM | POA: Diagnosis not present

## 2020-07-08 DIAGNOSIS — Z79890 Hormone replacement therapy (postmenopausal): Secondary | ICD-10-CM | POA: Insufficient documentation

## 2020-07-08 DIAGNOSIS — Z20822 Contact with and (suspected) exposure to covid-19: Secondary | ICD-10-CM | POA: Insufficient documentation

## 2020-07-08 DIAGNOSIS — W228XXA Striking against or struck by other objects, initial encounter: Secondary | ICD-10-CM | POA: Diagnosis not present

## 2020-07-08 DIAGNOSIS — R45851 Suicidal ideations: Secondary | ICD-10-CM

## 2020-07-08 LAB — RAPID URINE DRUG SCREEN, HOSP PERFORMED
Amphetamines: NOT DETECTED
Barbiturates: NOT DETECTED
Benzodiazepines: NOT DETECTED
Cocaine: NOT DETECTED
Opiates: NOT DETECTED
Tetrahydrocannabinol: NOT DETECTED

## 2020-07-08 LAB — CBC
HCT: 41.4 % (ref 39.0–52.0)
Hemoglobin: 13.2 g/dL (ref 13.0–17.0)
MCH: 31.6 pg (ref 26.0–34.0)
MCHC: 31.9 g/dL (ref 30.0–36.0)
MCV: 99 fL (ref 80.0–100.0)
Platelets: 147 10*3/uL — ABNORMAL LOW (ref 150–400)
RBC: 4.18 MIL/uL — ABNORMAL LOW (ref 4.22–5.81)
RDW: 16.1 % — ABNORMAL HIGH (ref 11.5–15.5)
WBC: 5 10*3/uL (ref 4.0–10.5)
nRBC: 0 % (ref 0.0–0.2)

## 2020-07-08 LAB — COMPREHENSIVE METABOLIC PANEL
ALT: 25 U/L (ref 0–44)
AST: 30 U/L (ref 15–41)
Albumin: 4 g/dL (ref 3.5–5.0)
Alkaline Phosphatase: 50 U/L (ref 38–126)
Anion gap: 16 — ABNORMAL HIGH (ref 5–15)
BUN: 8 mg/dL (ref 6–20)
CO2: 24 mmol/L (ref 22–32)
Calcium: 9 mg/dL (ref 8.9–10.3)
Chloride: 103 mmol/L (ref 98–111)
Creatinine, Ser: 1.28 mg/dL — ABNORMAL HIGH (ref 0.61–1.24)
GFR calc Af Amer: >60 mL/min (ref >60)
GFR calc non Af Amer: >60 mL/min (ref >60)
Glucose, Bld: 110 mg/dL — ABNORMAL HIGH (ref 70–99)
Potassium: 3.2 mmol/L — ABNORMAL LOW (ref 3.5–5.1)
Sodium: 143 mmol/L (ref 135–145)
Total Bilirubin: 0.7 mg/dL (ref 0.3–1.2)
Total Protein: 7.3 g/dL (ref 6.5–8.1)

## 2020-07-08 LAB — ETHANOL: Alcohol, Ethyl (B): 18 mg/dL — ABNORMAL HIGH (ref <10)
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
## 2020-07-08 NOTE — ED Notes (Signed)
This pt is dressed in purple scrubs and security has wanded the patient already.   All personal belongings are placed in locker #3 in purple zone.  Belongings such as wallet and money were handed to security.  Pt was understanding and cooperative when explaining to him where his belongings were going and getting dressed. This patient is sitting in a recliner in triage.
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
## 2020-07-09 ENCOUNTER — Emergency Department (HOSPITAL_COMMUNITY): Payer: No Typology Code available for payment source

## 2020-07-09 ENCOUNTER — Other Ambulatory Visit: Payer: Self-pay

## 2020-07-09 LAB — SARS CORONAVIRUS 2 BY RT PCR (HOSPITAL ORDER, PERFORMED IN ~~LOC~~ HOSPITAL LAB): SARS Coronavirus 2: NEGATIVE

## 2020-07-09 MED ORDER — LOSARTAN POTASSIUM 50 MG PO TABS
50.0000 mg | ORAL_TABLET | Freq: Every day | ORAL | Status: DC
  Administered 2020-07-09: 50 mg via ORAL
  Filled 2020-07-09: qty 1

## 2020-07-09 MED ORDER — MESALAMINE 1.2 G PO TBEC
1.2000 g | DELAYED_RELEASE_TABLET | Freq: Three times a day (TID) | ORAL | Status: DC
  Administered 2020-07-09: 1.2 g via ORAL
  Filled 2020-07-09 (×2): qty 1

## 2020-07-09 MED ORDER — LACTULOSE 10 GM/15ML PO SOLN
20.0000 g | Freq: Every day | ORAL | Status: DC
  Administered 2020-07-09: 20 g via ORAL
  Filled 2020-07-09: qty 30

## 2020-07-09 MED ORDER — ARIPIPRAZOLE 5 MG PO TABS
5.0000 mg | ORAL_TABLET | Freq: Every day | ORAL | Status: DC
  Administered 2020-07-09: 5 mg via ORAL
  Filled 2020-07-09: qty 1

## 2020-07-09 MED ORDER — PANTOPRAZOLE SODIUM 40 MG PO TBEC
40.0000 mg | DELAYED_RELEASE_TABLET | Freq: Two times a day (BID) | ORAL | Status: DC
  Administered 2020-07-09: 40 mg via ORAL
  Filled 2020-07-09: qty 1

## 2020-07-09 MED ORDER — ATORVASTATIN CALCIUM 40 MG PO TABS: 40.0000 mg | ORAL_TABLET | Freq: Every day | ORAL | Status: DC

## 2020-07-09 MED ORDER — FUROSEMIDE 20 MG PO TABS: 40.0000 mg | ORAL_TABLET | Freq: Two times a day (BID) | ORAL | Status: DC

## 2020-07-09 MED ORDER — DULOXETINE HCL 60 MG PO CPEP
60.0000 mg | ORAL_CAPSULE | Freq: Every day | ORAL | Status: DC
  Administered 2020-07-09: 60 mg via ORAL
  Filled 2020-07-09: qty 1

## 2020-07-09 MED ORDER — TRAZODONE HCL 50 MG PO TABS: 100.0000 mg | ORAL_TABLET | Freq: Every day | ORAL | Status: DC

## 2020-07-09 MED ORDER — ALBUTEROL SULFATE HFA 108 (90 BASE) MCG/ACT IN AERS
2.0000 | INHALATION_SPRAY | Freq: Once | RESPIRATORY_TRACT | Status: AC
  Administered 2020-07-09: 2 via RESPIRATORY_TRACT
  Filled 2020-07-09: qty 6.7

## 2020-07-09 MED ORDER — FAMOTIDINE 20 MG PO TABS
20.0000 mg | ORAL_TABLET | Freq: Two times a day (BID) | ORAL | Status: DC
  Administered 2020-07-09: 20 mg via ORAL
  Filled 2020-07-09: qty 1

## 2020-07-09 MED ORDER — ACETAMINOPHEN 500 MG PO TABS
1000.0000 mg | ORAL_TABLET | Freq: Once | ORAL | Status: AC
  Administered 2020-07-09: 1000 mg via ORAL
  Filled 2020-07-09: qty 2

## 2020-07-09 MED ORDER — CARVEDILOL 12.5 MG PO TABS: 12.5000 mg | ORAL_TABLET | Freq: Two times a day (BID) | ORAL | Status: DC

## 2020-07-09 MED ORDER — MIRTAZAPINE 7.5 MG PO TABS
7.5000 mg | ORAL_TABLET | Freq: Every day | ORAL | Status: DC
  Filled 2020-07-09: qty 1

## 2020-07-09 MED ORDER — LEVOTHYROXINE SODIUM 88 MCG PO TABS: 88.0000 ug | ORAL_TABLET | Freq: Every day | ORAL | Status: DC

## 2020-07-09 NOTE — Progress Notes (Signed)
Spoke with [facility], they are currently on mental health diversion and have no bed space.  [place], Disposition [phone] (cell)

## 2020-07-09 NOTE — Progress Notes (Signed)
CSW spoke with April at the [facility] and assessed that all [facility] are on diversion today. She has alerted the [facility], where pt normally gets his medical and [place] care. Referral information has been sent to the following hospitals for review:  [facility]  [facility]  [facility]     Disposition will continue to assist with inpatient placement needs.    Audree Camel, MSW, LCSW, [place] Clinical Social Worker II Disposition CSW [phone]
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
## 2020-07-09 NOTE — Progress Notes (Signed)
EDCSW received request from spouse, Bethena Roys for update.  This CSW informed spouse that referrals are still processing and that [facility] will be handling case. Spouse requests that PCP be updated as too any placement of Pt.
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
## 2020-07-09 NOTE — ED Notes (Signed)
MD has spoke with pt , pt requesting to leave, pt has been ready DC information , and VS taken, pt denies any needs at this time. Pt has been given his belongings out of locker #3 and money and wallet given as well from security, pt verifies he has all of belongings and pt walked to lobby

## 2020-07-11 ENCOUNTER — Telehealth: Payer: Self-pay | Admitting: Clinical
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
## 2020-07-12 ENCOUNTER — Other Ambulatory Visit: Payer: Medicaid Other | Admitting: Clinical

## 2020-07-17 ENCOUNTER — Encounter: Payer: Self-pay | Admitting: Clinical

## 2020-07-17 ENCOUNTER — Other Ambulatory Visit: Payer: Self-pay

## 2020-07-17 DIAGNOSIS — R7989 Other specified abnormal findings of blood chemistry: Secondary | ICD-10-CM

## 2020-07-17 NOTE — Progress Notes (Signed)
Low TSH.  Here for Free T4
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
## 2020-07-18 LAB — T4, FREE: Free T4: 1.28 ng/dL (ref 0.82–1.77)

## 2020-07-23 ENCOUNTER — Inpatient Hospital Stay (HOSPITAL_COMMUNITY): Payer: No Typology Code available for payment source

## 2020-07-23 ENCOUNTER — Other Ambulatory Visit: Payer: Self-pay

## 2020-07-23 ENCOUNTER — Emergency Department (HOSPITAL_COMMUNITY): Payer: No Typology Code available for payment source

## 2020-07-23 ENCOUNTER — Encounter (HOSPITAL_COMMUNITY): Payer: Self-pay

## 2020-07-23 ENCOUNTER — Observation Stay (HOSPITAL_COMMUNITY)
Admission: EM | Admit: 2020-07-23 | Discharge: 2020-07-24 | Disposition: A | Discharge status: Home / Self Care | Payer: No Typology Code available for payment source | Attending: Internal Medicine | Admitting: Internal Medicine

## 2020-07-23 DIAGNOSIS — Y902 Blood alcohol level of 40-59 mg/100 ml: Secondary | ICD-10-CM | POA: Insufficient documentation

## 2020-07-23 DIAGNOSIS — R4182 Altered mental status, unspecified: Principal | ICD-10-CM | POA: Diagnosis present

## 2020-07-23 DIAGNOSIS — F10229 Alcohol dependence with intoxication, unspecified: Secondary | ICD-10-CM | POA: Diagnosis not present

## 2020-07-23 DIAGNOSIS — I110 Hypertensive heart disease with heart failure: Secondary | ICD-10-CM | POA: Insufficient documentation

## 2020-07-23 DIAGNOSIS — K625 Hemorrhage of anus and rectum: Secondary | ICD-10-CM

## 2020-07-23 DIAGNOSIS — Z8719 Personal history of other diseases of the digestive system: Secondary | ICD-10-CM | POA: Diagnosis present

## 2020-07-23 DIAGNOSIS — K51911 Ulcerative colitis, unspecified with rectal bleeding: Secondary | ICD-10-CM

## 2020-07-23 DIAGNOSIS — Z20822 Contact with and (suspected) exposure to covid-19: Secondary | ICD-10-CM | POA: Diagnosis not present

## 2020-07-23 DIAGNOSIS — F17210 Nicotine dependence, cigarettes, uncomplicated: Secondary | ICD-10-CM | POA: Diagnosis not present

## 2020-07-23 DIAGNOSIS — E039 Hypothyroidism, unspecified: Secondary | ICD-10-CM | POA: Diagnosis present

## 2020-07-23 DIAGNOSIS — R109 Unspecified abdominal pain: Secondary | ICD-10-CM | POA: Insufficient documentation

## 2020-07-23 DIAGNOSIS — K529 Noninfective gastroenteritis and colitis, unspecified: Secondary | ICD-10-CM

## 2020-07-23 DIAGNOSIS — K7040 Alcoholic hepatic failure without coma: Secondary | ICD-10-CM | POA: Diagnosis not present

## 2020-07-23 DIAGNOSIS — F1020 Alcohol dependence, uncomplicated: Secondary | ICD-10-CM | POA: Diagnosis present

## 2020-07-23 DIAGNOSIS — R410 Disorientation, unspecified: Secondary | ICD-10-CM | POA: Diagnosis not present

## 2020-07-23 DIAGNOSIS — I5022 Chronic systolic (congestive) heart failure: Secondary | ICD-10-CM | POA: Diagnosis present

## 2020-07-23 DIAGNOSIS — K7290 Hepatic failure, unspecified without coma: Secondary | ICD-10-CM

## 2020-07-23 DIAGNOSIS — G4730 Sleep apnea, unspecified: Secondary | ICD-10-CM | POA: Insufficient documentation

## 2020-07-23 DIAGNOSIS — I10 Essential (primary) hypertension: Secondary | ICD-10-CM | POA: Diagnosis present

## 2020-07-23 DIAGNOSIS — G9341 Metabolic encephalopathy: Secondary | ICD-10-CM | POA: Diagnosis present

## 2020-07-23 DIAGNOSIS — Z79899 Other long term (current) drug therapy: Secondary | ICD-10-CM | POA: Insufficient documentation

## 2020-07-23 DIAGNOSIS — K922 Gastrointestinal hemorrhage, unspecified: Secondary | ICD-10-CM | POA: Diagnosis present

## 2020-07-23 DIAGNOSIS — K7460 Unspecified cirrhosis of liver: Secondary | ICD-10-CM

## 2020-07-23 DIAGNOSIS — K7682 Hepatic encephalopathy: Secondary | ICD-10-CM | POA: Diagnosis present

## 2020-07-23 DIAGNOSIS — R188 Other ascites: Secondary | ICD-10-CM

## 2020-07-23 DIAGNOSIS — F1010 Alcohol abuse, uncomplicated: Secondary | ICD-10-CM

## 2020-07-23 LAB — CBC WITH DIFFERENTIAL/PLATELET
Abs Immature Granulocytes: 0.01 10*3/uL (ref 0.00–0.07)
Basophils Absolute: 0 10*3/uL (ref 0.0–0.1)
Basophils Relative: 1 %
Eosinophils Absolute: 0.1 10*3/uL (ref 0.0–0.5)
Eosinophils Relative: 4 %
HCT: 37.2 % — ABNORMAL LOW (ref 39.0–52.0)
Hemoglobin: 12.9 g/dL — ABNORMAL LOW (ref 13.0–17.0)
Immature Granulocytes: 0 %
Lymphocytes Relative: 47 %
Lymphs Abs: 1.6 10*3/uL (ref 0.7–4.0)
MCH: 33.1 pg (ref 26.0–34.0)
MCHC: 34.7 g/dL (ref 30.0–36.0)
MCV: 95.4 fL (ref 80.0–100.0)
Monocytes Absolute: 0.4 10*3/uL (ref 0.1–1.0)
Monocytes Relative: 13 %
Neutro Abs: 1.2 10*3/uL — ABNORMAL LOW (ref 1.7–7.7)
Neutrophils Relative %: 35 %
Platelets: 267 10*3/uL (ref 150–400)
RBC: 3.9 MIL/uL — ABNORMAL LOW (ref 4.22–5.81)
RDW: 14.9 % (ref 11.5–15.5)
WBC: 3.3 10*3/uL — ABNORMAL LOW (ref 4.0–10.5)
nRBC: 0 % (ref 0.0–0.2)

## 2020-07-23 LAB — COMPREHENSIVE METABOLIC PANEL
ALT: 36 U/L (ref 0–44)
AST: 48 U/L — ABNORMAL HIGH (ref 15–41)
Albumin: 3.7 g/dL (ref 3.5–5.0)
Alkaline Phosphatase: 58 U/L (ref 38–126)
Anion gap: 12 (ref 5–15)
BUN: 6 mg/dL (ref 6–20)
CO2: 25 mmol/L (ref 22–32)
Calcium: 8.7 mg/dL — ABNORMAL LOW (ref 8.9–10.3)
Chloride: 103 mmol/L (ref 98–111)
Creatinine, Ser: 0.87 mg/dL (ref 0.61–1.24)
GFR calc Af Amer: >60 mL/min (ref >60)
GFR calc non Af Amer: >60 mL/min (ref >60)
Glucose, Bld: 141 mg/dL — ABNORMAL HIGH (ref 70–99)
Potassium: 3.8 mmol/L (ref 3.5–5.1)
Sodium: 140 mmol/L (ref 135–145)
Total Bilirubin: 0.7 mg/dL (ref 0.3–1.2)
Total Protein: 6.9 g/dL (ref 6.5–8.1)

## 2020-07-23 LAB — PROTIME-INR
INR: 1.1 (ref 0.8–1.2)
Prothrombin Time: 13.5 seconds (ref 11.4–15.2)

## 2020-07-23 LAB — ETHANOL: Alcohol, Ethyl (B): 55 mg/dL — ABNORMAL HIGH (ref <10)

## 2020-07-23 LAB — RESPIRATORY PANEL BY RT PCR (FLU A&B, COVID)
Influenza A by PCR: NEGATIVE
Influenza B by PCR: NEGATIVE
SARS Coronavirus 2 by RT PCR: NEGATIVE

## 2020-07-23 LAB — TYPE AND SCREEN
ABO/RH(D): O POS
Antibody Screen: NEGATIVE

## 2020-07-23 LAB — LIPASE, BLOOD: Lipase: 29 U/L (ref 11–51)

## 2020-07-23 LAB — OCCULT BLOOD X 1 CARD TO LAB, STOOL: Fecal Occult Bld: POSITIVE — AB

## 2020-07-23 LAB — CREATININE, SERUM
Creatinine, Ser: 1.01 mg/dL (ref 0.61–1.24)
GFR calc Af Amer: >60 mL/min (ref >60)
GFR calc non Af Amer: >60 mL/min (ref >60)

## 2020-07-23 LAB — CBG MONITORING, ED
Glucose-Capillary: 154 mg/dL — ABNORMAL HIGH (ref 70–99)
Glucose-Capillary: 122 mg/dL — ABNORMAL HIGH (ref 70–99)

## 2020-07-23 LAB — AMMONIA: Ammonia: 280 umol/L — ABNORMAL HIGH (ref 9–35)

## 2020-07-23 MED ORDER — ALBUTEROL SULFATE (2.5 MG/3ML) 0.083% IN NEBU: 2.5000 mg | INHALATION_SOLUTION | RESPIRATORY_TRACT | Status: DC | PRN

## 2020-07-23 MED ORDER — ONDANSETRON HCL 4 MG PO TABS: 4.0000 mg | ORAL_TABLET | Freq: Four times a day (QID) | ORAL | Status: DC | PRN

## 2020-07-23 MED ORDER — SODIUM CHLORIDE 0.9 % IV BOLUS
500.0000 mL | Freq: Once | INTRAVENOUS | Status: AC
  Administered 2020-07-23: 500 mL via INTRAVENOUS

## 2020-07-23 MED ORDER — CHLORDIAZEPOXIDE HCL 5 MG PO CAPS
5.0000 mg | ORAL_CAPSULE | Freq: Two times a day (BID) | ORAL | Status: DC
  Administered 2020-07-23 – 2020-07-24 (×2): 5 mg via ORAL
  Filled 2020-07-23 (×2): qty 1

## 2020-07-23 MED ORDER — SODIUM CHLORIDE 0.9 % IV SOLN
80.0000 mg | Freq: Once | INTRAVENOUS | Status: AC
  Administered 2020-07-23: 13:00:00 80 mg via INTRAVENOUS
  Filled 2020-07-23: qty 80

## 2020-07-23 MED ORDER — HEPARIN SODIUM (PORCINE) 5000 UNIT/ML IJ SOLN
5000.0000 [IU] | Freq: Three times a day (TID) | INTRAMUSCULAR | Status: DC
  Administered 2020-07-23 – 2020-07-24 (×3): 5000 [IU] via SUBCUTANEOUS
  Filled 2020-07-23 (×3): qty 1

## 2020-07-23 MED ORDER — HYDRALAZINE HCL 20 MG/ML IJ SOLN: 5.0000 mg | INTRAMUSCULAR | Status: DC | PRN

## 2020-07-23 MED ORDER — RIFAXIMIN 550 MG PO TABS
550.0000 mg | ORAL_TABLET | Freq: Two times a day (BID) | ORAL | Status: DC
  Administered 2020-07-23 – 2020-07-24 (×2): 550 mg via ORAL
  Filled 2020-07-23 (×4): qty 1

## 2020-07-23 MED ORDER — ONDANSETRON HCL 4 MG/2ML IJ SOLN: 4.0000 mg | Freq: Four times a day (QID) | INTRAMUSCULAR | Status: DC | PRN

## 2020-07-23 MED ORDER — LACTULOSE 10 GM/15ML PO SOLN
20.0000 g | Freq: Three times a day (TID) | ORAL | Status: DC
  Administered 2020-07-23 (×2): 20 g via ORAL
  Filled 2020-07-23 (×4): qty 30

## 2020-07-23 MED ORDER — LACTULOSE ENEMA
300.0000 mL | Freq: Once | ORAL | Status: DC
  Filled 2020-07-23: qty 300

## 2020-07-23 MED ORDER — THIAMINE HCL 100 MG/ML IJ SOLN
100.0000 mg | Freq: Every day | INTRAMUSCULAR | Status: DC
  Administered 2020-07-23 – 2020-07-24 (×2): 100 mg via INTRAVENOUS
  Filled 2020-07-23 (×2): qty 2

## 2020-07-23 MED ORDER — LACTULOSE 10 GM/15ML PO SOLN
20.0000 g | Freq: Three times a day (TID) | ORAL | Status: DC
  Filled 2020-07-23: qty 30

## 2020-07-23 MED ORDER — PANTOPRAZOLE SODIUM 40 MG IV SOLR
40.0000 mg | Freq: Every day | INTRAVENOUS | Status: DC
  Administered 2020-07-23 – 2020-07-24 (×2): 40 mg via INTRAVENOUS
  Filled 2020-07-23 (×2): qty 40

## 2020-07-23 NOTE — ED Notes (Signed)
Pt attempting to get out of bed at this time. Pt instructed he can not get out of bed. Pt verbalizes understanding.
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
## 2020-07-23 NOTE — ED Triage Notes (Signed)
Pt BIB [facility] from home, pt c/o abd pain and rectal bleeding x1 day. EMS reports pt looks confused, per spouse this happens often .  BP 152/95 HR 86 96% RA  CBG 136 97.3

## 2020-07-23 NOTE — ED Notes (Signed)
Pt pulled out IV, climbed out of bed, and had  BM in floor.  Pt assisted back to bed and encouraged not to get up without staff present to help him. Pt verbalizes understanding, but remains confused. Will continue to monitor.

## 2020-07-23 NOTE — Progress Notes (Signed)
Pt seen in ER as he is awaiting progressive care bed. Reviewed chart.  Pt is resting comfortably at this time with no complaints. Awake and alert.  States he is starting to feel better.  Is hemodynamically stable.  No concerns by RN staff at this time Continue current care while awaiting for transfer to progressive care unit.

## 2020-07-23 NOTE — ED Notes (Signed)
Bed placed alarm under pt. PT instructed not to try to get out of bed. Urinal and call bell within reach. Pt verbalizes understanding.

## 2020-07-23 NOTE — ED Notes (Signed)
Pt S/O at bedside

## 2020-07-23 NOTE — ED Notes (Signed)
Patient assisted with repositioning. Reinforced call bell use, pt verbalized understanding. Room safe.
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
## 2020-07-23 NOTE — ED Notes (Signed)
PT alert to self, location, event, and month. Could not recall year or day of week

## 2020-07-23 NOTE — ED Notes (Signed)
edwina [phone] [id number]  Spoke with her to give update, ok per husband. She reports patient is taking Abilify. Wife provided with room number to call patient.

## 2020-07-23 NOTE — ED Notes (Signed)
Requested Librium dose from pharmacy. Awaiting same.

## 2020-07-23 NOTE — ED Notes (Signed)
Pt remembered to use call bell to alert staff that he needed to poop. Pt placed on bed pan then pan removed

## 2020-07-23 NOTE — ED Notes (Signed)
Patient continues to rest peacefully in bed watching tv

## 2020-07-23 NOTE — ED Notes (Signed)
Patient got up in bed, had had BM consistent with previous BMs in ED. Patient cleansed and repositioned by nurse tech. This RN placed call bell in reach. TV turned on for patient. Patient reoriented and educated on call bell use. Room safe. SR up x 2. Bed alarm on and audible.

## 2020-07-23 NOTE — ED Triage Notes (Signed)
Emergency Medicine Provider Triage Evaluation Note  Jacob Adams , a 54 y.o. male  was evaluated in triage.  Pt complains of abdominal pain. Keeps repeating, "It hurts all over." Won't give any other details beyond admitting to alcohol use in general.  Review of Systems  Positive: Abdominal pain, altered mental status Negative: None given  Physical Exam  BP (!) 152/101 (BP Location: Right Arm)   Pulse 86   Temp 98.3 F (36.8 C) (Oral)   Resp 20   SpO2 98%  Gen:   Difficulty staying awake HEENT:  Atraumatic, maintaining airway and oral secretions. Resp:  Normal effort Cardiac:  Normal rate Abd:   Nondistended, nontender. Would not give details or reaction to abdominal exam. MSK:   Moves extremities.  Neuro:  Asterixis   Medical Decision Making  Medically screening exam initiated at 8:35 AM.  Appropriate orders placed.  Jacob Adams was informed that the remainder of the evaluation will be completed by another provider, this initial triage assessment does not replace that evaluation, and the importance of remaining in the ED until their evaluation is complete.  Clinical Impression   Altered mental status. Drinks alcohol. Advised triage nurse, Jacob Adams, patient needs to go back to a room next.    Jacob Bender, PA-C 07/23/20 [phone]

## 2020-07-24 DIAGNOSIS — F1020 Alcohol dependence, uncomplicated: Secondary | ICD-10-CM | POA: Diagnosis not present

## 2020-07-24 DIAGNOSIS — F1010 Alcohol abuse, uncomplicated: Secondary | ICD-10-CM

## 2020-07-24 DIAGNOSIS — K529 Noninfective gastroenteritis and colitis, unspecified: Secondary | ICD-10-CM | POA: Diagnosis not present

## 2020-07-24 DIAGNOSIS — R410 Disorientation, unspecified: Secondary | ICD-10-CM

## 2020-07-24 LAB — CBC
HCT: 35.9 % — ABNORMAL LOW (ref 39.0–52.0)
Hemoglobin: 12 g/dL — ABNORMAL LOW (ref 13.0–17.0)
MCH: 32.3 pg (ref 26.0–34.0)
MCHC: 33.4 g/dL (ref 30.0–36.0)
MCV: 96.8 fL (ref 80.0–100.0)
Platelets: 259 10*3/uL (ref 150–400)
RBC: 3.71 MIL/uL — ABNORMAL LOW (ref 4.22–5.81)
RDW: 15.1 % (ref 11.5–15.5)
WBC: 5.4 10*3/uL (ref 4.0–10.5)
nRBC: 0 % (ref 0.0–0.2)

## 2020-07-24 LAB — URINALYSIS, ROUTINE W REFLEX MICROSCOPIC
Bacteria, UA: NONE SEEN
Bilirubin Urine: NEGATIVE
Glucose, UA: NEGATIVE mg/dL
Hgb urine dipstick: NEGATIVE
Ketones, ur: 5 mg/dL — AB
Leukocytes,Ua: NEGATIVE
Nitrite: NEGATIVE
Protein, ur: 100 mg/dL — AB
Specific Gravity, Urine: 1.027 (ref 1.005–1.030)
pH: 6 (ref 5.0–8.0)

## 2020-07-24 LAB — CBC WITH DIFFERENTIAL/PLATELET
Abs Immature Granulocytes: 0.03 10*3/uL (ref 0.00–0.07)
Basophils Absolute: 0 10*3/uL (ref 0.0–0.1)
Basophils Relative: 1 %
Eosinophils Absolute: 0.1 10*3/uL (ref 0.0–0.5)
Eosinophils Relative: 2 %
HCT: 35.2 % — ABNORMAL LOW (ref 39.0–52.0)
Hemoglobin: 11.6 g/dL — ABNORMAL LOW (ref 13.0–17.0)
Immature Granulocytes: 1 %
Lymphocytes Relative: 42 %
Lymphs Abs: 2.5 10*3/uL (ref 0.7–4.0)
MCH: 31.6 pg (ref 26.0–34.0)
MCHC: 33 g/dL (ref 30.0–36.0)
MCV: 95.9 fL (ref 80.0–100.0)
Monocytes Absolute: 0.6 10*3/uL (ref 0.1–1.0)
Monocytes Relative: 11 %
Neutro Abs: 2.7 10*3/uL (ref 1.7–7.7)
Neutrophils Relative %: 43 %
Platelets: 216 10*3/uL (ref 150–400)
RBC: 3.67 MIL/uL — ABNORMAL LOW (ref 4.22–5.81)
RDW: 15 % (ref 11.5–15.5)
WBC: 6 10*3/uL (ref 4.0–10.5)
nRBC: 0 % (ref 0.0–0.2)

## 2020-07-24 LAB — COMPREHENSIVE METABOLIC PANEL
ALT: 33 U/L (ref 0–44)
AST: 35 U/L (ref 15–41)
Albumin: 3.5 g/dL (ref 3.5–5.0)
Alkaline Phosphatase: 52 U/L (ref 38–126)
Anion gap: 13 (ref 5–15)
BUN: 6 mg/dL (ref 6–20)
CO2: 23 mmol/L (ref 22–32)
Calcium: 8.7 mg/dL — ABNORMAL LOW (ref 8.9–10.3)
Chloride: 107 mmol/L (ref 98–111)
Creatinine, Ser: 0.99 mg/dL (ref 0.61–1.24)
GFR calc Af Amer: >60 mL/min (ref >60)
GFR calc non Af Amer: >60 mL/min (ref >60)
Glucose, Bld: 101 mg/dL — ABNORMAL HIGH (ref 70–99)
Potassium: 3.2 mmol/L — ABNORMAL LOW (ref 3.5–5.1)
Sodium: 143 mmol/L (ref 135–145)
Total Bilirubin: 1.1 mg/dL (ref 0.3–1.2)
Total Protein: 6.5 g/dL (ref 6.5–8.1)

## 2020-07-24 LAB — AMMONIA: Ammonia: 35 umol/L (ref 9–35)

## 2020-07-24 MED ORDER — MESALAMINE 1.2 G PO TBEC
4.8000 g | DELAYED_RELEASE_TABLET | Freq: Every day | ORAL | Status: DC
  Administered 2020-07-24: 4.8 g via ORAL
  Filled 2020-07-24 (×2): qty 4

## 2020-07-24 MED ORDER — SPIRONOLACTONE 25 MG PO TABS
25.0000 mg | ORAL_TABLET | Freq: Every day | ORAL | 1 refills | Status: DC
Start: 2020-07-24 — End: 2022-01-27

## 2020-07-24 MED ORDER — FOLIC ACID 1 MG PO TABS: 1.0000 mg | ORAL_TABLET | Freq: Every day | ORAL | 1 refills | Status: AC

## 2020-07-24 MED ORDER — MESALAMINE 1.2 G PO TBEC: 4.8000 g | DELAYED_RELEASE_TABLET | Freq: Every day | ORAL | 1 refills | Status: DC

## 2020-07-24 MED ORDER — POTASSIUM CHLORIDE ER 20 MEQ PO TBCR: 40.0000 meq | EXTENDED_RELEASE_TABLET | Freq: Every day | ORAL | 0 refills | Status: DC

## 2020-07-24 MED ORDER — THIAMINE HCL 100 MG PO TABS: 100.0000 mg | ORAL_TABLET | Freq: Every day | ORAL | 1 refills | Status: DC

## 2020-07-24 MED ORDER — POTASSIUM CHLORIDE ER 20 MEQ PO TBCR: 20.0000 meq | EXTENDED_RELEASE_TABLET | Freq: Every day | ORAL | 0 refills | Status: DC

## 2020-07-24 MED ORDER — ACETAMINOPHEN 325 MG PO TABS
650.0000 mg | ORAL_TABLET | Freq: Four times a day (QID) | ORAL | Status: DC | PRN
  Administered 2020-07-24: 650 mg via ORAL
  Filled 2020-07-24: qty 2

## 2020-07-24 MED ORDER — LACTULOSE 10 GM/15ML PO SOLN: 30.0000 g | Freq: Three times a day (TID) | ORAL | 3 refills | Status: DC

## 2020-07-24 MED ORDER — POTASSIUM CHLORIDE 10 MEQ/100ML IV SOLN
10.0000 meq | INTRAVENOUS | Status: DC
  Administered 2020-07-24 (×2): 10 meq via INTRAVENOUS
  Filled 2020-07-24 (×2): qty 100

## 2020-07-24 MED ORDER — RIFAXIMIN 550 MG PO TABS: 550.0000 mg | ORAL_TABLET | Freq: Two times a day (BID) | ORAL | 1 refills | Status: DC

## 2020-07-24 MED ORDER — LACTULOSE 10 GM/15ML PO SOLN
30.0000 g | Freq: Three times a day (TID) | ORAL | Status: DC
  Administered 2020-07-24: 30 g via ORAL
  Filled 2020-07-24: qty 45

## 2020-07-24 MED ORDER — POTASSIUM CHLORIDE CRYS ER 20 MEQ PO TBCR
20.0000 meq | EXTENDED_RELEASE_TABLET | Freq: Once | ORAL | Status: AC
  Administered 2020-07-24: 20 meq via ORAL
  Filled 2020-07-24: qty 1

## 2020-07-24 NOTE — ED Notes (Signed)
Patient continues to rest in bed with HOB elevated. Door open due to fall risk. Patient watching TV, call bell in reach. NAD noted at this time.

## 2020-07-24 NOTE — Progress Notes (Addendum)
Patient discharged home. AVS printed and reviewed with patient who verbalizes understanding of all discharge instructions. Any questions from patient answered appropriately prior to discharge. PIV removed. Bus pass provided to patient by case management. Patient stable at time of discharge.
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
## 2020-07-24 NOTE — ED Notes (Signed)
Pt found standing at side of bed, pt stated "I'm using the bathroom". Pt assisted back to bed and brief was changed. Pt given clean blankets and Posey pad adjusted under the pt. Pt now resting comfortably in bed.
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
## 2020-07-24 NOTE — ED Notes (Addendum)
Pt found standing on the bed with a large amount of loose bowel movement on the bed, pt stated "I'm trying to get out of that". This NT assisted the pt back down onto the stretcher and assisted to stand in order to clean the stretcher and linens changed. Pt placed in clean gown and brief, condom cath also placed. Pt given warm blankets and tv adjusted to preferred channel.

## 2020-07-24 NOTE — ED Notes (Signed)
Patient provided with PO fluids, tolerating well at this time.

## 2020-07-24 NOTE — ED Notes (Signed)
Patient placed on bed pan by nurse tech. This RN went to check on pt, pt found sitting up in bed removing the bed pan himself. RN reinforced call bell use at this time. Patient able to sit in chair in room while this RN changed linens. Bed alarm in place and on. Call bell in reach. Positioned the bed per pt comfort. Pt denies further needs at this time.

## 2020-07-24 NOTE — ED Notes (Signed)
Message sent to pharmacy requesting Librium dose

## 2020-07-25 ENCOUNTER — Other Ambulatory Visit: Payer: Medicaid Other | Admitting: Clinical

## 2020-08-10 ENCOUNTER — Encounter: Payer: Self-pay | Admitting: Internal Medicine

## 2020-08-10 ENCOUNTER — Ambulatory Visit: Payer: Medicaid Other | Admitting: Internal Medicine

## 2020-08-10 VITALS — BP 118/70 | HR 97 | Resp 12 | Ht 68.0 in | Wt 269.0 lb

## 2020-08-10 DIAGNOSIS — K51911 Ulcerative colitis, unspecified with rectal bleeding: Secondary | ICD-10-CM

## 2020-08-10 DIAGNOSIS — Z23 Encounter for immunization: Secondary | ICD-10-CM

## 2020-08-10 DIAGNOSIS — K7682 Hepatic encephalopathy: Secondary | ICD-10-CM

## 2020-08-10 DIAGNOSIS — F1020 Alcohol dependence, uncomplicated: Secondary | ICD-10-CM

## 2020-08-10 DIAGNOSIS — K7290 Hepatic failure, unspecified without coma: Secondary | ICD-10-CM

## 2020-08-10 DIAGNOSIS — G4700 Insomnia, unspecified: Secondary | ICD-10-CM

## 2020-08-10 NOTE — Patient Instructions (Addendum)
Warm bath and reading before bed. If unable to get to sleep in 20 minutes, get out of bed, go somewhere calm and read until you are sleepy, then back to bed. If awaken and unable to get back to sleep in 20 minutes--read as above until sleepy. Physical activity with a friend every day--gradually work up to 1 hour.  Do not exercise close to bedtime. No napping  Drink a glass of water before every meal Drink 6-8 glasses of water daily Eat three meals daily Eat a protein and healthy fat with every meal (eggs,fish, chicken, [place] and limit red meats) Eat 5 servings of vegetables daily, mix the colors Eat 2 servings of fruit daily with skin, if skin is edible Use smaller plates Put food/utensils down as you chew and swallow each bite Eat at a table with friends/family at least once daily, no TV Do not eat in front of the TV  Recent studies show that people who consume all of their calories in a 12 hour period lose weight more efficiently.  For example, if you eat your first meal at 7:00 a.m., your last meal of the day should be completed by 7:00 p.m.  Almond milk--3 cups daily

## 2020-08-10 NOTE — Progress Notes (Signed)
Subjective:    Patient ID: Jacob Adams, male   DOB: August 19, 1966, 54 y.o.   MRN: [id number]   HPI   Hospital follow up for hepatic encephalopathy/elevated ammonia level:  Has not drunk alcohol since the hospitalization from 10/4 to 07/24/2020.  Was + for alcohol on admission and Rifaximin added to lactulose for the encephalopathy. He had a follow up abdominal U/S at [facility] with normal appearing liver, has had ?cirrhotic appearing liver on CT scans in past, though I am unable to find that documentation currently in his chart.  I do see one CT and and ultrasound that suggest fatty liver. He was to have a follow up with his gastroenterologist at the [facility] per discharge planning, but he knows nothing of this. His wife keeps tabs on his appointments as he forgets.  Ulcerative colitis:  No longer having bloody stools after restart of Mesalamine in hospital.    Insomnia:  Paces or watches TV.  No regular sleep or wake time.  Current Meds  Medication Sig  . ARIPiprazole (ABILIFY) 20 MG tablet Take 10 mg by mouth daily.  Marland Kitchen atorvastatin (LIPITOR) 80 MG tablet Take 40 mg by mouth at bedtime.   . carvedilol (COREG) 6.25 MG tablet Take 6.25 mg by mouth 2 (two) times daily with a meal.  . diclofenac Sodium (VOLTAREN) 1 % GEL Apply 1 application topically 4 (four) times daily.   . DULoxetine (CYMBALTA) 60 MG capsule Take 60 mg by mouth at bedtime.   . ferrous sulfate 325 (65 FE) MG tablet Take 325 mg by mouth 2 (two) times daily with a meal.  . folic acid (FOLVITE) 1 MG tablet Take 1 tablet (1 mg total) by mouth daily.  . furosemide (LASIX) 40 MG tablet Take 80 mg by mouth daily.   Marland Kitchen lactulose (CHRONULAC) 10 GM/15ML solution Take 45 mLs (30 g total) by mouth 3 (three) times daily.  Marland Kitchen latanoprost (XALATAN) 0.005 % ophthalmic solution Place 1 drop into both eyes at bedtime.  Marland Kitchen levothyroxine (SYNTHROID) 88 MCG tablet Take 88 mcg by mouth daily before breakfast.  . losartan (COZAAR) 50 MG tablet Take 1  tablet (50 mg total) by mouth daily. (Patient taking differently: Take 50 mg by mouth daily with supper. )  . mesalamine (LIALDA) 1.2 g EC tablet Take 4 tablets (4.8 g total) by mouth daily.  . mirtazapine (REMERON) 15 MG tablet Take 15 mg by mouth at bedtime.  . pantoprazole (PROTONIX) 40 MG tablet Take 1 tablet (40 mg total) by mouth 2 (two) times daily.  . Potassium Chloride ER 20 MEQ TBCR Take 20 mEq by mouth daily.  . rifaximin (XIFAXAN) 550 MG TABS tablet Take 1 tablet (550 mg total) by mouth 2 (two) times daily.  . sildenafil (VIAGRA) 100 MG tablet Take 100 mg by mouth daily as needed for erectile dysfunction.   Marland Kitchen spironolactone (ALDACTONE) 25 MG tablet Take 1 tablet (25 mg total) by mouth daily.  Marland Kitchen thiamine 100 MG tablet Take 1 tablet (100 mg total) by mouth daily.   Allergies  Allergen Reactions  . Uncoded Nonscreenable Allergen Swelling and Other (See Comments)    Sun tan lotion: swelling and peeling  . Ibuprofen Nausea Only, Swelling and Other (See Comments)    "Discomfort of stomach," also  . Nsaids Swelling and Other (See Comments)    "Discomfort of stomach," also  . Penicillins Itching, Swelling and Rash    Has patient had a PCN reaction causing immediate rash, facial/tongue/throat  swelling, SOB or lightheadedness with hypotension: yes Has patient had a PCN reaction causing severe rash involving mucus membranes or skin necrosis: no Has patient had a PCN reaction that required hospitalization yes, happened while hospitalized Has patient had a PCN reaction occurring within the last 10 years: yes If all of the above answers are "NO", then may proceed with Cephalosporin use.   . [name] Other (See Comments)    DOESN'T EAT PORK- patient preference     Review of Systems    Objective:   BP 118/70 (BP Location: Left Arm, Patient Position: Sitting, Cuff Size: Normal)   Pulse 97   Resp 12   Ht 5\' 8"  (1.727 m)   Wt 269 lb (122 kg)   BMI 40.90 kg/m    Physical Exam  NAD HEENT:  PERRL, EOMI, sclera anicteric Neck:  Supple, No adenopathy Chest:  CTA CV:  RRR without murmur or rub.  Radial pulses normal and equal Abd:  S, NT, No HSM or mass, + BS LE:  No edema   Assessment & Plan  1.  Hepatic encephalopathy with alcohol abuse:  Encouraged patient to follow up with Danton Clap, LCSW, LCAS-A.  He has previously made appts with her, but has not followed through.   Needs to work on alcohol abuse before expect continuity of self care with other health issues.. Currently with clear thinking on combination of Rifaxamin and Lactulose.    2.  Insomnia:  Discussed would not use medication for this.  Discussed sleep hygiene again.  Encouraged daily small goals for physical activity outside.  No TV or pacing/busy work for sleep.  See patient instructions.  3.  Obesity:  Lifestyle changes discussed.  4.  Ulcerative Colitis:  Symptoms improved now back on mesalamine in hospital.  5.  HM:  Influenza and Pfizer COVID booster vaccination today.

## 2020-08-17 ENCOUNTER — Ambulatory Visit: Payer: Medicaid Other | Admitting: Internal Medicine

## 2020-09-21 ENCOUNTER — Telehealth: Payer: Self-pay | Admitting: Internal Medicine

## 2020-09-24 NOTE — Telephone Encounter (Signed)
No need to send a note--go ahead an schedule for an acute and place on wait list to see if can be worked in earlier

## 2020-09-25 NOTE — Telephone Encounter (Signed)
Scheduled acute appointment for patient on 09/28/2020 @ 12:30pm

## 2020-09-28 ENCOUNTER — Ambulatory Visit: Payer: Medicaid Other | Admitting: Internal Medicine

## 2020-10-03 ENCOUNTER — Telehealth: Payer: Self-pay | Admitting: Gastroenterology

## 2020-10-03 NOTE — Telephone Encounter (Signed)
Hi Dr. Rush Landmark,  Received a referral for patient to have a colonoscopy due to Ulcerative Colitis. Patient does not have a recall in Epic had one in last year. Also received follow up notes from 03/2020 will be sending them to you for review.   Please advise on scheduling.  Thank you

## 2020-10-04 ENCOUNTER — Encounter: Payer: Self-pay | Admitting: Internal Medicine

## 2020-10-04 ENCOUNTER — Ambulatory Visit: Payer: Medicaid Other | Admitting: Internal Medicine

## 2020-10-04 DIAGNOSIS — G4700 Insomnia, unspecified: Secondary | ICD-10-CM | POA: Insufficient documentation

## 2020-10-05 NOTE — Telephone Encounter (Signed)
The records that I reviewed showed no new information about the patient.  He follows with the [place] in [place] with GI there.  Not clear to me that he needs to follow-up with [place] unless they are putting a referral for [place] to follow him as a secondary GI.  I would not move forward with scheduling a direct colonoscopy.  If we are going to have this patient in our list or on our radar, he needs to [facility] to [place] for review and also a referral placed for community care. Once these results and records have been reviewed we will decide if he needs another colonoscopy but again he will not be a direct colonoscopy and not be scheduled for a visit until additional information is received and we receive the approval from community care as the [place]. Thanks. GM

## 2020-10-29 ENCOUNTER — Ambulatory Visit: Payer: Medicaid Other | Admitting: Internal Medicine

## 2021-02-27 ENCOUNTER — Ambulatory Visit: Payer: Medicaid Other | Admitting: Internal Medicine

## 2021-05-14 IMAGING — DX DG CHEST 1V PORT
1 series · 1 of 1 positions shown · non-contrast
Comparison: 07/09/2020

CLINICAL DATA: Chest pain, shortness of breath

EXAM:
PORTABLE CHEST 1 VIEW

[chest ap]
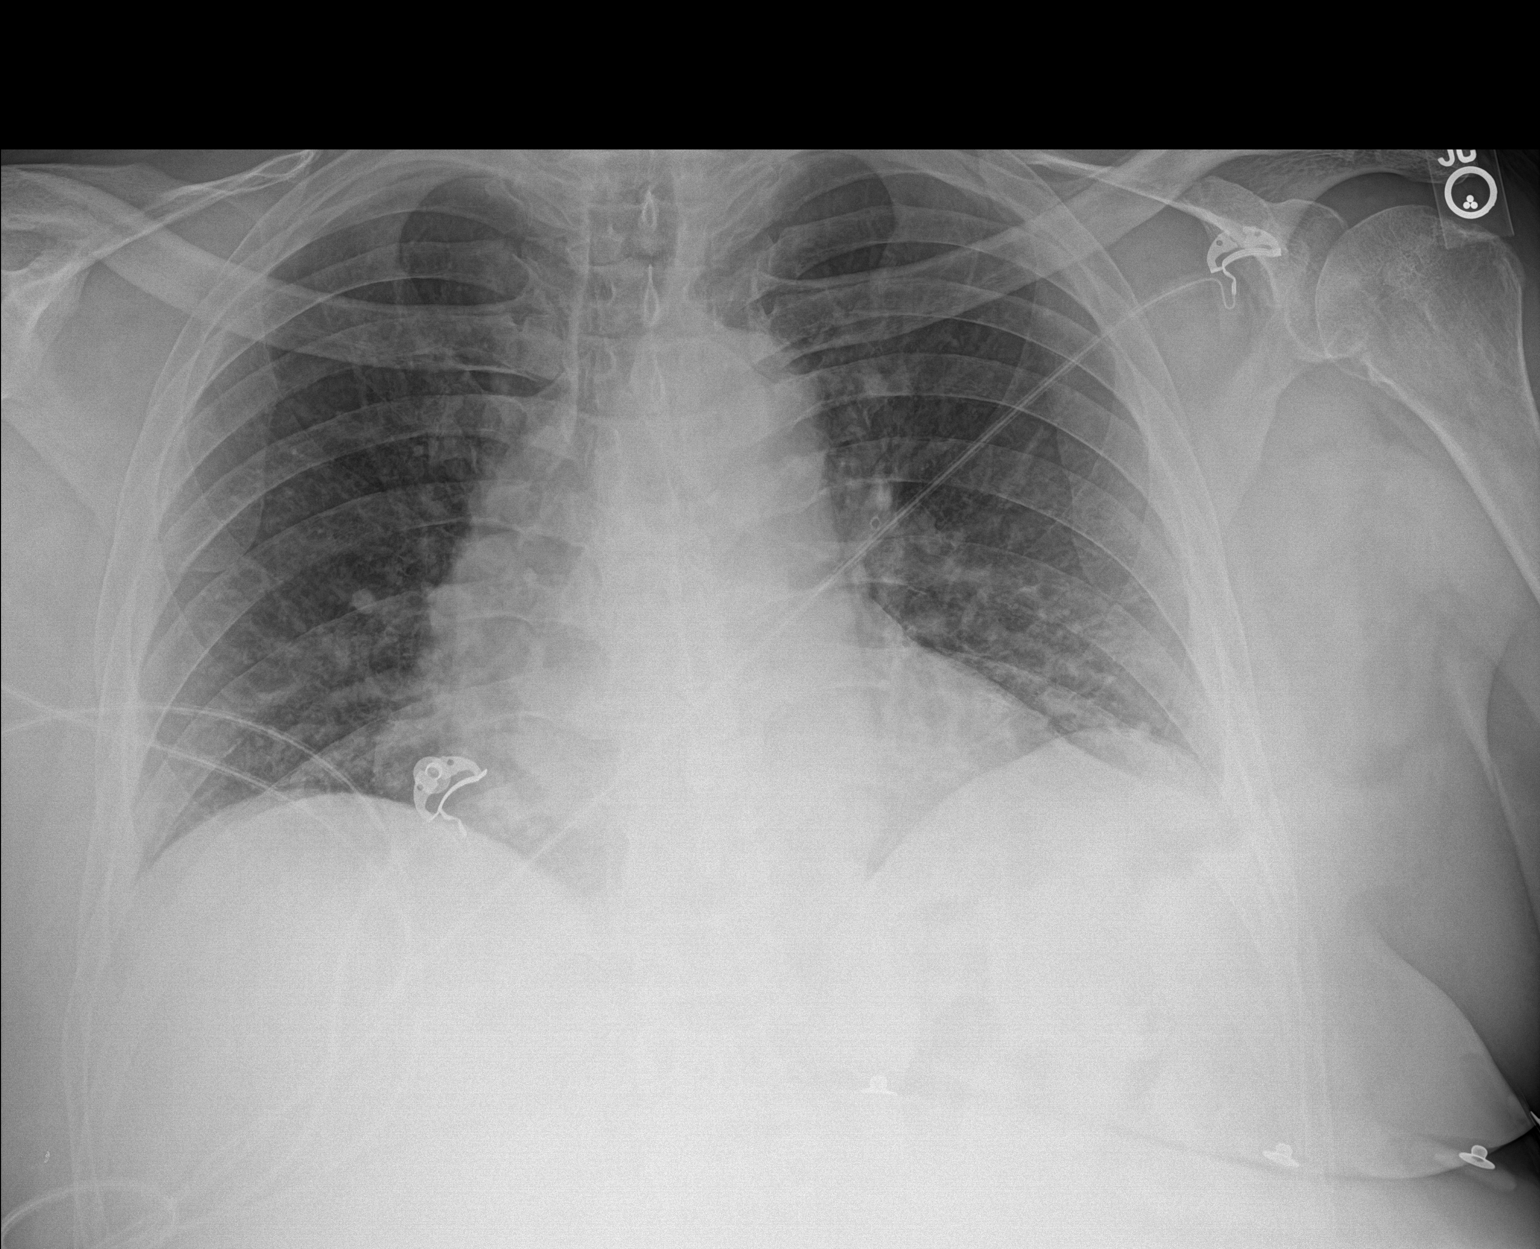

[1 of 1 positions shown; findings below may reference images not displayed]

FINDINGS: The heart size and mediastinal contours are within normal limits.
Mildly prominent bibasilar interstitial markings. No pleural
effusion or pneumothorax. The visualized skeletal structures are
unremarkable.
IMPRESSION: Mildly prominent bibasilar interstitial markings, which may reflect
atelectasis versus atypical infection.

## 2021-05-14 IMAGING — CT CT HEAD W/O CM
3 series · 16 of 47 positions shown, 19 images · non-contrast
Comparison: July 08, 2020.

CLINICAL DATA: Altered mental status.

EXAM:
CT HEAD WITHOUT CONTRAST
TECHNIQUE: Contiguous axial images were obtained from the base of the skull
through the vertex without intravenous contrast.

[Series 4: head 5.0 h30s · axial · 0.50mm/px · z∈[-69,+66]mm · 10 of 33 slices shown, 13 images]
[im 3/33  brain]
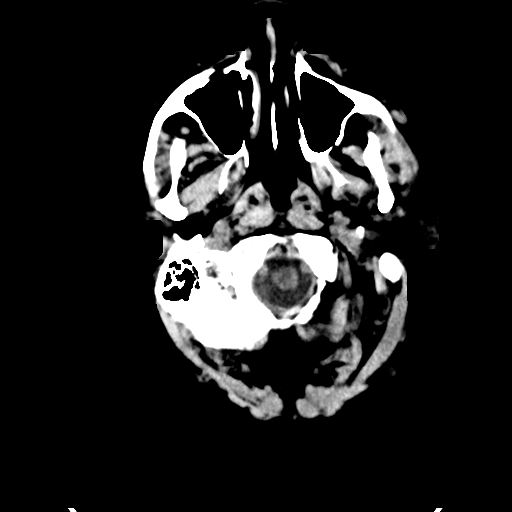
[im 3/33  bone]
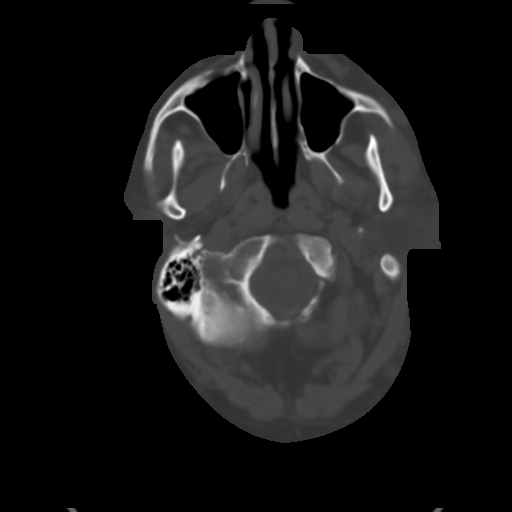
[im 6/33  brain]
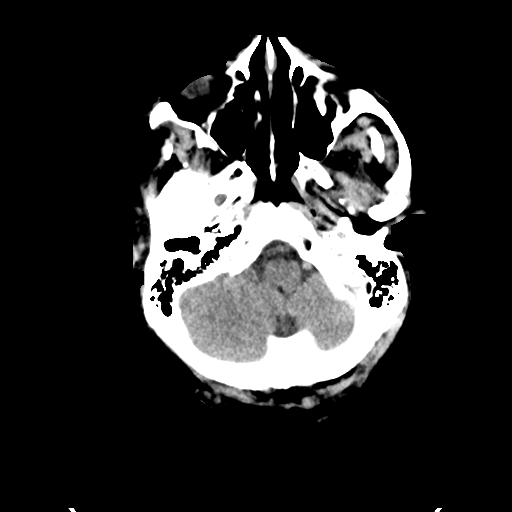
[im 9/33  brain]
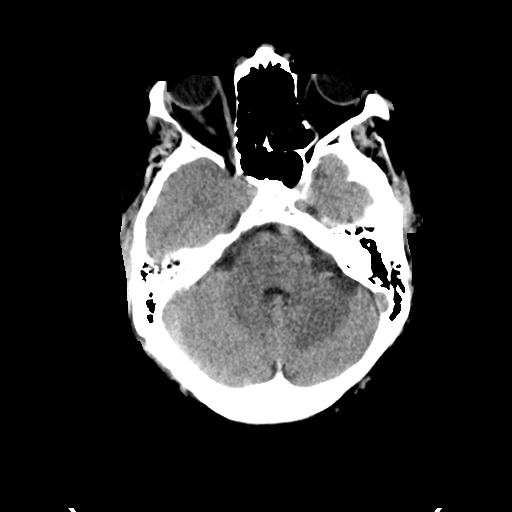
[im 12/33  brain]
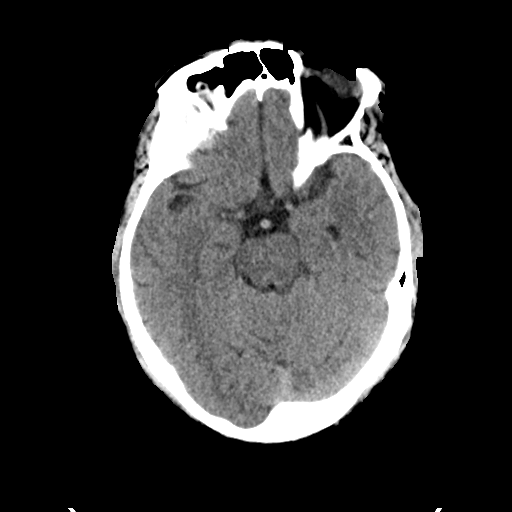
[im 15/33  brain]
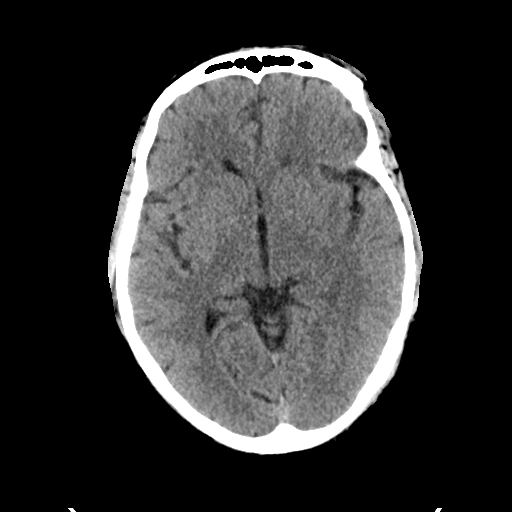
[im 15/33  bone]
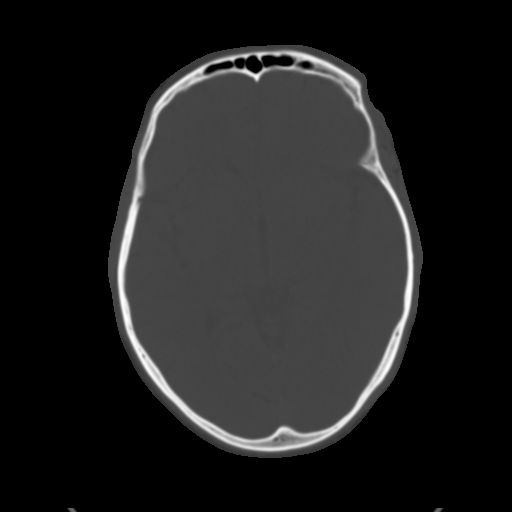
[im 18/33  brain]
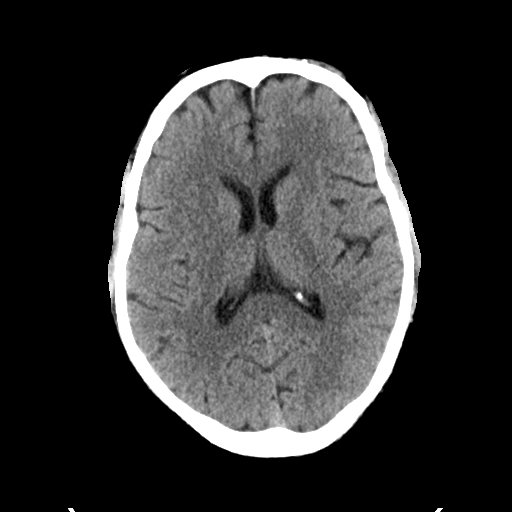
[im 21/33  brain]
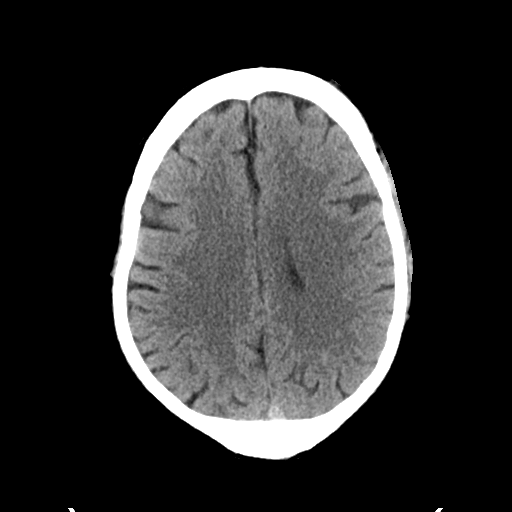
[im 25/33  brain]
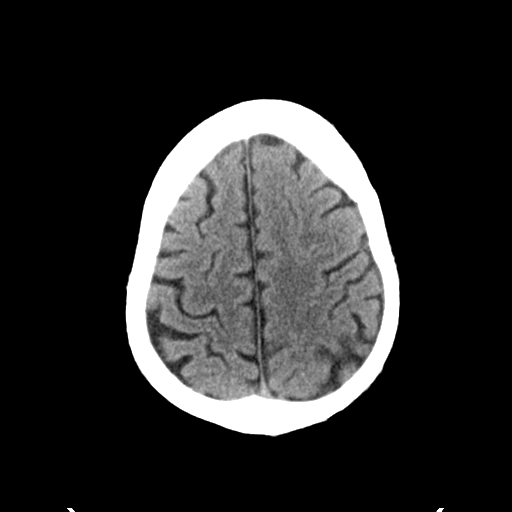
[im 27/33  brain]
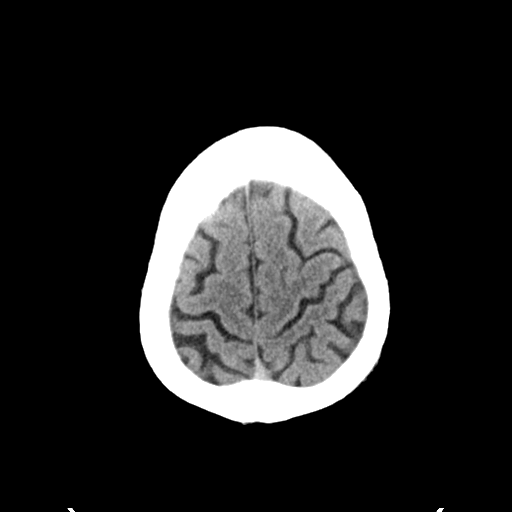
[im 27/33  bone]
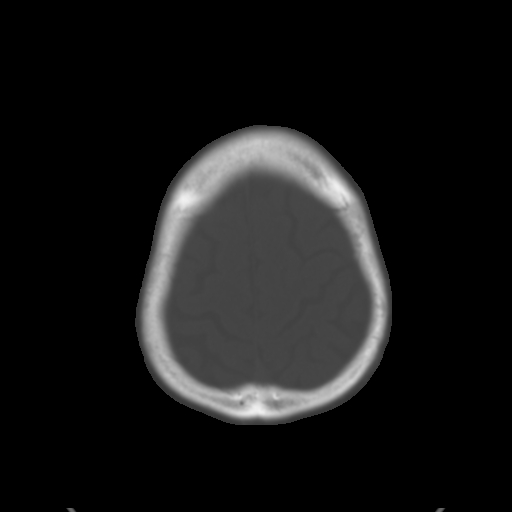
[im 30/33  brain]
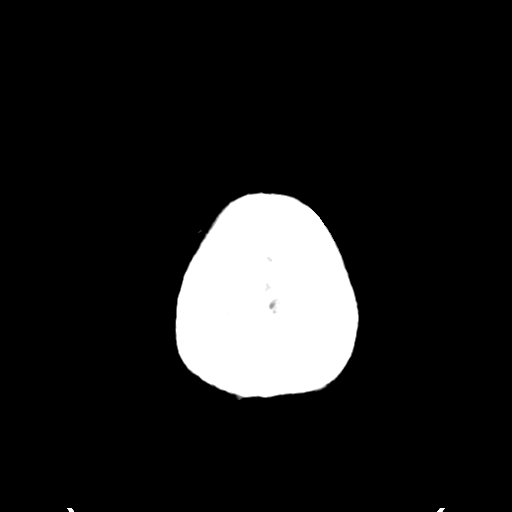

[Series 5: head 3.0 mpr cor · coronal · 0.37mm/px · 3 of 78 slices shown]
[im 26/78  brain]
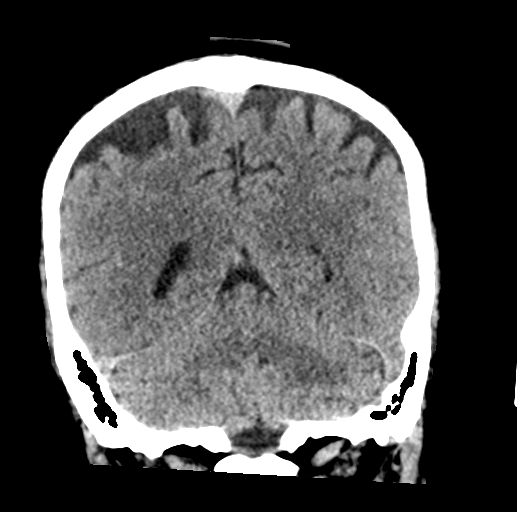
[im 35/78  brain]
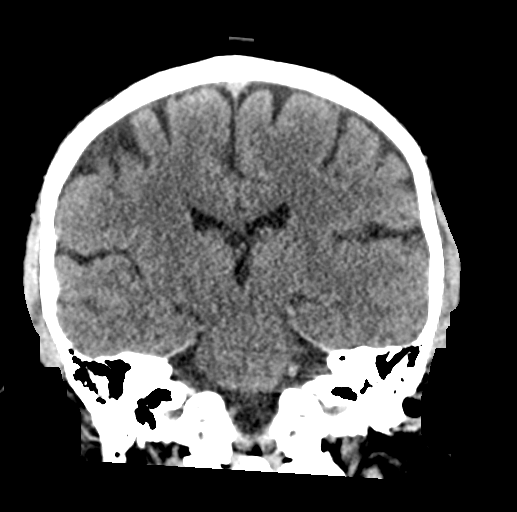
[im 43/78  brain]
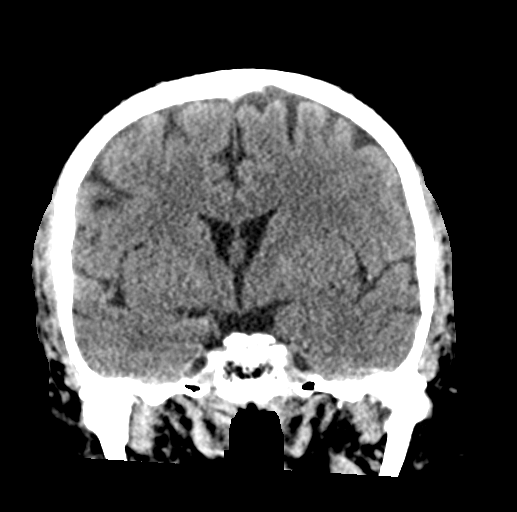

[Series 6: head 3.0 mpr sag · sagittal · 0.35mm/px · 3 of 58 slices shown]
[im 20/58  brain]
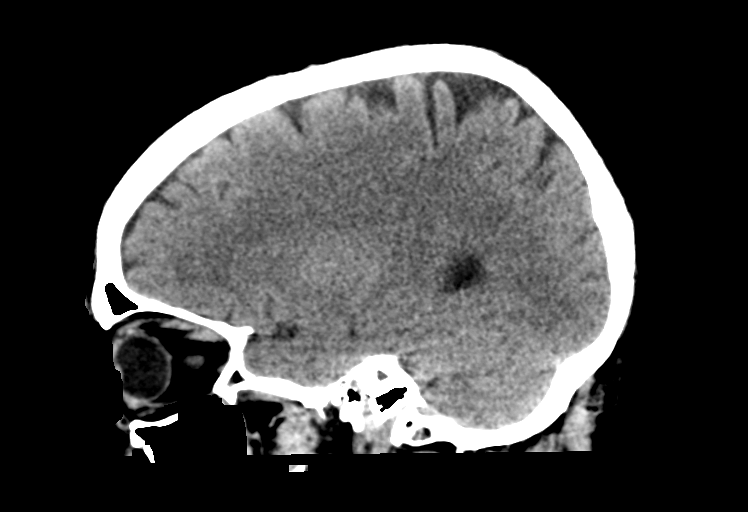
[im 29/58  brain]
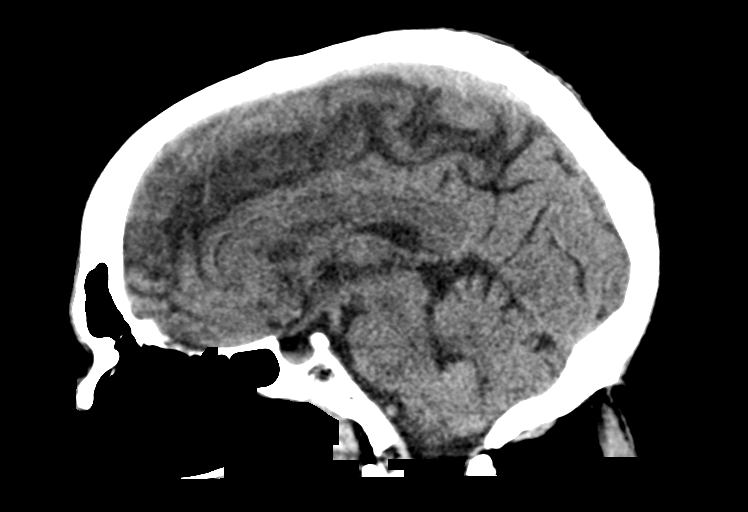
[im 39/58  brain]
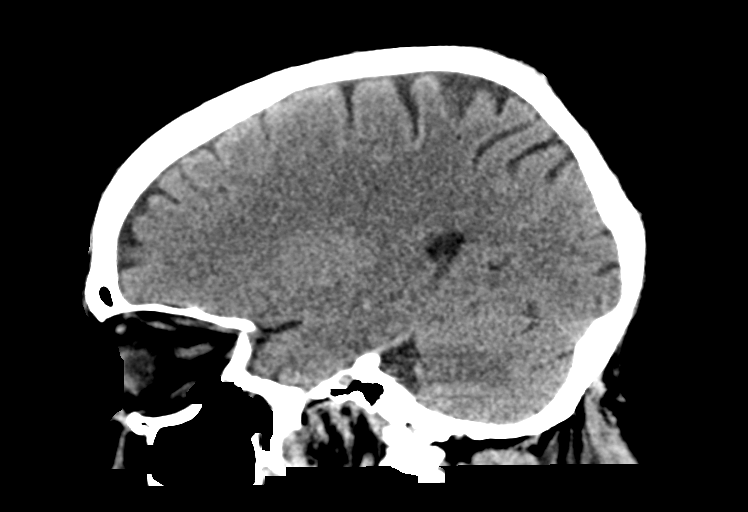

[16 of 47 positions shown; findings below may reference images not displayed]

FINDINGS: Brain: No evidence of acute infarction, hemorrhage, hydrocephalus,
extra-axial collection or mass lesion/mass effect.

Vascular: No hyperdense vessel or unexpected calcification.

Skull: Normal. Negative for fracture or focal lesion.

Sinuses/Orbits: No acute finding.

Other: None.
IMPRESSION: Normal head CT.

## 2021-05-15 ENCOUNTER — Other Ambulatory Visit (HOSPITAL_BASED_OUTPATIENT_CLINIC_OR_DEPARTMENT_OTHER): Payer: Self-pay

## 2021-05-15 ENCOUNTER — Emergency Department (HOSPITAL_BASED_OUTPATIENT_CLINIC_OR_DEPARTMENT_OTHER)
Admission: EM | Admit: 2021-05-15 | Discharge: 2021-05-15 | Disposition: A | Discharge status: Home / Self Care | Payer: No Typology Code available for payment source | Attending: Emergency Medicine | Admitting: Emergency Medicine

## 2021-05-15 ENCOUNTER — Other Ambulatory Visit: Payer: Self-pay

## 2021-05-15 DIAGNOSIS — Z79899 Other long term (current) drug therapy: Secondary | ICD-10-CM | POA: Diagnosis not present

## 2021-05-15 DIAGNOSIS — I10 Essential (primary) hypertension: Secondary | ICD-10-CM | POA: Diagnosis not present

## 2021-05-15 DIAGNOSIS — U071 COVID-19: Secondary | ICD-10-CM | POA: Insufficient documentation

## 2021-05-15 DIAGNOSIS — F17210 Nicotine dependence, cigarettes, uncomplicated: Secondary | ICD-10-CM | POA: Diagnosis not present

## 2021-05-15 LAB — BASIC METABOLIC PANEL WITH GFR
Anion gap: 11 (ref 5–15)
BUN: 17 mg/dL (ref 6–20)
CO2: 23 mmol/L (ref 22–32)
Calcium: 8.6 mg/dL — ABNORMAL LOW (ref 8.9–10.3)
Chloride: 104 mmol/L (ref 98–111)
Creatinine, Ser: 1.47 mg/dL — ABNORMAL HIGH (ref 0.61–1.24)
GFR, Estimated: 56 mL/min — ABNORMAL LOW
Glucose, Bld: 110 mg/dL — ABNORMAL HIGH (ref 70–99)
Potassium: 3.7 mmol/L (ref 3.5–5.1)
Sodium: 138 mmol/L (ref 135–145)

## 2021-05-15 MED ORDER — NIRMATRELVIR/RITONAVIR (PAXLOVID)TABLET
3.0000 | ORAL_TABLET | Freq: Two times a day (BID) | ORAL | 0 refills | Status: AC
  Filled 2021-05-15: qty 30, 5d supply, fill #0

## 2021-05-15 NOTE — ED Triage Notes (Signed)
Pt arrived via EMS. Pt diagnosed with COVID and c/o SOB. PCP told him to go to ED

## 2021-05-15 NOTE — ED Triage Notes (Signed)
Patient reports to the ER for COVID + and cough. Patient reports he normally goes to the [place]. Patient states he has CHF and they wanted him to go get treated. For the [place].  PCR positive test at [facility] x2 days ago.
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
## 2021-07-02 ENCOUNTER — Ambulatory Visit: Payer: Medicaid Other | Admitting: Internal Medicine

## 2022-01-22 ENCOUNTER — Inpatient Hospital Stay (HOSPITAL_COMMUNITY)
Admission: EM | Admit: 2022-01-22 | Discharge: 2022-01-27 | DRG: 640 | Disposition: A | Discharge status: Home / Self Care | Payer: No Typology Code available for payment source | Attending: Internal Medicine | Admitting: Internal Medicine

## 2022-01-22 ENCOUNTER — Emergency Department (HOSPITAL_COMMUNITY): Payer: No Typology Code available for payment source

## 2022-01-22 ENCOUNTER — Other Ambulatory Visit: Payer: Self-pay

## 2022-01-22 ENCOUNTER — Encounter (HOSPITAL_COMMUNITY): Payer: Self-pay | Admitting: Emergency Medicine

## 2022-01-22 DIAGNOSIS — K51911 Ulcerative colitis, unspecified with rectal bleeding: Secondary | ICD-10-CM | POA: Diagnosis present

## 2022-01-22 DIAGNOSIS — F32A Depression, unspecified: Secondary | ICD-10-CM

## 2022-01-22 DIAGNOSIS — F10239 Alcohol dependence with withdrawal, unspecified: Secondary | ICD-10-CM | POA: Diagnosis present

## 2022-01-22 DIAGNOSIS — K219 Gastro-esophageal reflux disease without esophagitis: Secondary | ICD-10-CM | POA: Diagnosis present

## 2022-01-22 DIAGNOSIS — Z6372 Alcoholism and drug addiction in family: Secondary | ICD-10-CM

## 2022-01-22 DIAGNOSIS — G4730 Sleep apnea, unspecified: Secondary | ICD-10-CM | POA: Diagnosis present

## 2022-01-22 DIAGNOSIS — I5022 Chronic systolic (congestive) heart failure: Secondary | ICD-10-CM | POA: Diagnosis present

## 2022-01-22 DIAGNOSIS — R112 Nausea with vomiting, unspecified: Secondary | ICD-10-CM

## 2022-01-22 DIAGNOSIS — Z20822 Contact with and (suspected) exposure to covid-19: Secondary | ICD-10-CM | POA: Diagnosis present

## 2022-01-22 DIAGNOSIS — I110 Hypertensive heart disease with heart failure: Secondary | ICD-10-CM | POA: Diagnosis present

## 2022-01-22 DIAGNOSIS — E860 Dehydration: Secondary | ICD-10-CM | POA: Diagnosis present

## 2022-01-22 DIAGNOSIS — F17200 Nicotine dependence, unspecified, uncomplicated: Secondary | ICD-10-CM | POA: Diagnosis present

## 2022-01-22 DIAGNOSIS — E871 Hypo-osmolality and hyponatremia: Secondary | ICD-10-CM | POA: Diagnosis not present

## 2022-01-22 DIAGNOSIS — E875 Hyperkalemia: Secondary | ICD-10-CM

## 2022-01-22 DIAGNOSIS — Z79890 Hormone replacement therapy (postmenopausal): Secondary | ICD-10-CM

## 2022-01-22 DIAGNOSIS — E8729 Other acidosis: Secondary | ICD-10-CM

## 2022-01-22 DIAGNOSIS — G9341 Metabolic encephalopathy: Secondary | ICD-10-CM

## 2022-01-22 DIAGNOSIS — F4310 Post-traumatic stress disorder, unspecified: Secondary | ICD-10-CM | POA: Diagnosis present

## 2022-01-22 DIAGNOSIS — Z886 Allergy status to analgesic agent status: Secondary | ICD-10-CM

## 2022-01-22 DIAGNOSIS — K760 Fatty (change of) liver, not elsewhere classified: Secondary | ICD-10-CM

## 2022-01-22 DIAGNOSIS — Z23 Encounter for immunization: Secondary | ICD-10-CM

## 2022-01-22 DIAGNOSIS — F17210 Nicotine dependence, cigarettes, uncomplicated: Secondary | ICD-10-CM | POA: Diagnosis present

## 2022-01-22 DIAGNOSIS — Z91018 Allergy to other foods: Secondary | ICD-10-CM

## 2022-01-22 DIAGNOSIS — R1013 Epigastric pain: Secondary | ICD-10-CM

## 2022-01-22 DIAGNOSIS — F1020 Alcohol dependence, uncomplicated: Secondary | ICD-10-CM | POA: Diagnosis present

## 2022-01-22 DIAGNOSIS — Z8711 Personal history of peptic ulcer disease: Secondary | ICD-10-CM

## 2022-01-22 DIAGNOSIS — E872 Acidosis, unspecified: Secondary | ICD-10-CM | POA: Diagnosis present

## 2022-01-22 DIAGNOSIS — E039 Hypothyroidism, unspecified: Secondary | ICD-10-CM | POA: Diagnosis present

## 2022-01-22 DIAGNOSIS — G4700 Insomnia, unspecified: Secondary | ICD-10-CM | POA: Diagnosis present

## 2022-01-22 DIAGNOSIS — N179 Acute kidney failure, unspecified: Secondary | ICD-10-CM | POA: Diagnosis present

## 2022-01-22 DIAGNOSIS — F319 Bipolar disorder, unspecified: Secondary | ICD-10-CM | POA: Diagnosis present

## 2022-01-22 DIAGNOSIS — Z8719 Personal history of other diseases of the digestive system: Secondary | ICD-10-CM

## 2022-01-22 DIAGNOSIS — Z79899 Other long term (current) drug therapy: Secondary | ICD-10-CM

## 2022-01-22 DIAGNOSIS — Z880 Allergy status to penicillin: Secondary | ICD-10-CM

## 2022-01-22 DIAGNOSIS — I10 Essential (primary) hypertension: Secondary | ICD-10-CM | POA: Diagnosis present

## 2022-01-22 LAB — BASIC METABOLIC PANEL
Anion gap: 16 — ABNORMAL HIGH (ref 5–15)
BUN: 22 mg/dL — ABNORMAL HIGH (ref 6–20)
CO2: 12 mmol/L — ABNORMAL LOW (ref 22–32)
Calcium: 8.8 mg/dL — ABNORMAL LOW (ref 8.9–10.3)
Chloride: 92 mmol/L — ABNORMAL LOW (ref 98–111)
Creatinine, Ser: 1.82 mg/dL — ABNORMAL HIGH (ref 0.61–1.24)
GFR, Estimated: 43 mL/min — ABNORMAL LOW (ref >60)
Glucose, Bld: 79 mg/dL (ref 70–99)
Potassium: 5.3 mmol/L — ABNORMAL HIGH (ref 3.5–5.1)
Sodium: 120 mmol/L — ABNORMAL LOW (ref 135–145)

## 2022-01-22 LAB — CBC
HCT: 38.2 % — ABNORMAL LOW (ref 39.0–52.0)
Hemoglobin: 12.8 g/dL — ABNORMAL LOW (ref 13.0–17.0)
MCH: 33.3 pg (ref 26.0–34.0)
MCHC: 33.5 g/dL (ref 30.0–36.0)
MCV: 99.5 fL (ref 80.0–100.0)
Platelets: 220 10*3/uL (ref 150–400)
RBC: 3.84 MIL/uL — ABNORMAL LOW (ref 4.22–5.81)
RDW: 13.4 % (ref 11.5–15.5)
WBC: 4.9 10*3/uL (ref 4.0–10.5)
nRBC: 0 % (ref 0.0–0.2)

## 2022-01-22 LAB — TROPONIN I (HIGH SENSITIVITY)
Troponin I (High Sensitivity): 13 ng/L (ref <18)
Troponin I (High Sensitivity): 11 ng/L (ref <18)

## 2022-01-22 NOTE — ED Notes (Signed)
Wife Jacob Adams [phone] would like a call back asap to relay some info about her husband ?

## 2022-01-22 NOTE — ED Provider Triage Note (Signed)
Emergency Medicine Provider Triage Evaluation Note ? ?Jacob Adams , a 56 y.o. male  was evaluated in triage.  Pt complains of chest pain that has been ongoing for over a week.  This is accompanied by shortness of breath, does report he ambulates at home however has had some decrease in this as he "does not feel well ".  Did not take any medication for improvement in his symptoms.  Denies any food intake for the past 3 weeks. ?Review of Systems  ?Positive: Chest pain, sob, cough ?Negative: Fever, nausea, vomiting ? ?Physical Exam  ?BP 98/63   Pulse 80   Temp 98.4 ?F (36.9 ?C)   Resp 18   Ht 5' 8"  (1.727 m)   Wt 101.6 kg   SpO2 99%   BMI 34.06 kg/m?  ?Gen:   Awake, no distress   ?Resp:  Normal effort  ?MSK:   Moves extremities without difficulty  ?Other:   ? ?Medical Decision Making  ?Medically screening exam initiated at 7:48 PM.  Appropriate orders placed.  Jacob Adams was informed that the remainder of the evaluation will be completed by another provider, this initial triage assessment does not replace that evaluation, and the importance of remaining in the ED until their evaluation is complete. ? ? ?  ?Janeece Fitting, PA-C ?01/22/22 1951 ? ?
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
## 2022-01-23 ENCOUNTER — Encounter (HOSPITAL_COMMUNITY): Payer: Self-pay | Admitting: Student

## 2022-01-23 DIAGNOSIS — E871 Hypo-osmolality and hyponatremia: Secondary | ICD-10-CM | POA: Diagnosis present

## 2022-01-23 DIAGNOSIS — E872 Acidosis, unspecified: Secondary | ICD-10-CM | POA: Diagnosis present

## 2022-01-23 DIAGNOSIS — F1020 Alcohol dependence, uncomplicated: Secondary | ICD-10-CM | POA: Diagnosis not present

## 2022-01-23 DIAGNOSIS — K51911 Ulcerative colitis, unspecified with rectal bleeding: Secondary | ICD-10-CM

## 2022-01-23 DIAGNOSIS — E875 Hyperkalemia: Secondary | ICD-10-CM

## 2022-01-23 DIAGNOSIS — E860 Dehydration: Secondary | ICD-10-CM | POA: Diagnosis present

## 2022-01-23 DIAGNOSIS — I5022 Chronic systolic (congestive) heart failure: Secondary | ICD-10-CM | POA: Diagnosis present

## 2022-01-23 DIAGNOSIS — Z79899 Other long term (current) drug therapy: Secondary | ICD-10-CM | POA: Diagnosis not present

## 2022-01-23 DIAGNOSIS — K760 Fatty (change of) liver, not elsewhere classified: Secondary | ICD-10-CM

## 2022-01-23 DIAGNOSIS — R1013 Epigastric pain: Secondary | ICD-10-CM

## 2022-01-23 DIAGNOSIS — E8729 Other acidosis: Secondary | ICD-10-CM | POA: Insufficient documentation

## 2022-01-23 DIAGNOSIS — F319 Bipolar disorder, unspecified: Secondary | ICD-10-CM | POA: Diagnosis present

## 2022-01-23 DIAGNOSIS — E039 Hypothyroidism, unspecified: Secondary | ICD-10-CM

## 2022-01-23 DIAGNOSIS — G9341 Metabolic encephalopathy: Secondary | ICD-10-CM

## 2022-01-23 DIAGNOSIS — Z8719 Personal history of other diseases of the digestive system: Secondary | ICD-10-CM

## 2022-01-23 DIAGNOSIS — I10 Essential (primary) hypertension: Secondary | ICD-10-CM

## 2022-01-23 DIAGNOSIS — Z886 Allergy status to analgesic agent status: Secondary | ICD-10-CM | POA: Diagnosis not present

## 2022-01-23 DIAGNOSIS — Z23 Encounter for immunization: Secondary | ICD-10-CM | POA: Diagnosis not present

## 2022-01-23 DIAGNOSIS — F10239 Alcohol dependence with withdrawal, unspecified: Secondary | ICD-10-CM | POA: Diagnosis present

## 2022-01-23 DIAGNOSIS — F419 Anxiety disorder, unspecified: Secondary | ICD-10-CM

## 2022-01-23 DIAGNOSIS — K219 Gastro-esophageal reflux disease without esophagitis: Secondary | ICD-10-CM | POA: Diagnosis present

## 2022-01-23 DIAGNOSIS — G4730 Sleep apnea, unspecified: Secondary | ICD-10-CM | POA: Diagnosis present

## 2022-01-23 DIAGNOSIS — N179 Acute kidney failure, unspecified: Secondary | ICD-10-CM | POA: Diagnosis present

## 2022-01-23 DIAGNOSIS — Z79890 Hormone replacement therapy (postmenopausal): Secondary | ICD-10-CM | POA: Diagnosis not present

## 2022-01-23 DIAGNOSIS — F17210 Nicotine dependence, cigarettes, uncomplicated: Secondary | ICD-10-CM | POA: Diagnosis present

## 2022-01-23 DIAGNOSIS — I110 Hypertensive heart disease with heart failure: Secondary | ICD-10-CM | POA: Diagnosis present

## 2022-01-23 DIAGNOSIS — F4310 Post-traumatic stress disorder, unspecified: Secondary | ICD-10-CM | POA: Diagnosis present

## 2022-01-23 DIAGNOSIS — F32A Depression, unspecified: Secondary | ICD-10-CM

## 2022-01-23 DIAGNOSIS — G4700 Insomnia, unspecified: Secondary | ICD-10-CM | POA: Diagnosis present

## 2022-01-23 DIAGNOSIS — R112 Nausea with vomiting, unspecified: Secondary | ICD-10-CM

## 2022-01-23 DIAGNOSIS — Z880 Allergy status to penicillin: Secondary | ICD-10-CM | POA: Diagnosis not present

## 2022-01-23 DIAGNOSIS — Z20822 Contact with and (suspected) exposure to covid-19: Secondary | ICD-10-CM | POA: Diagnosis present

## 2022-01-23 LAB — RENAL FUNCTION PANEL
Albumin: 3.6 g/dL (ref 3.5–5.0)
Albumin: 3.4 g/dL — ABNORMAL LOW (ref 3.5–5.0)
Albumin: 3.4 g/dL — ABNORMAL LOW (ref 3.5–5.0)
Anion gap: 15 (ref 5–15)
Anion gap: 8 (ref 5–15)
Anion gap: 7 (ref 5–15)
BUN: 25 mg/dL — ABNORMAL HIGH (ref 6–20)
BUN: 23 mg/dL — ABNORMAL HIGH (ref 6–20)
BUN: 25 mg/dL — ABNORMAL HIGH (ref 6–20)
CO2: 13 mmol/L — ABNORMAL LOW (ref 22–32)
CO2: 20 mmol/L — ABNORMAL LOW (ref 22–32)
CO2: 19 mmol/L — ABNORMAL LOW (ref 22–32)
Calcium: 8.4 mg/dL — ABNORMAL LOW (ref 8.9–10.3)
Calcium: 8.1 mg/dL — ABNORMAL LOW (ref 8.9–10.3)
Calcium: 8 mg/dL — ABNORMAL LOW (ref 8.9–10.3)
Chloride: 93 mmol/L — ABNORMAL LOW (ref 98–111)
Chloride: 95 mmol/L — ABNORMAL LOW (ref 98–111)
Chloride: 96 mmol/L — ABNORMAL LOW (ref 98–111)
Creatinine, Ser: 2.54 mg/dL — ABNORMAL HIGH (ref 0.61–1.24)
Creatinine, Ser: 2.41 mg/dL — ABNORMAL HIGH (ref 0.61–1.24)
Creatinine, Ser: 2.25 mg/dL — ABNORMAL HIGH (ref 0.61–1.24)
GFR, Estimated: 29 mL/min — ABNORMAL LOW (ref >60)
GFR, Estimated: 31 mL/min — ABNORMAL LOW (ref >60)
GFR, Estimated: 33 mL/min — ABNORMAL LOW (ref >60)
Glucose, Bld: 84 mg/dL (ref 70–99)
Glucose, Bld: 119 mg/dL — ABNORMAL HIGH (ref 70–99)
Glucose, Bld: 119 mg/dL — ABNORMAL HIGH (ref 70–99)
Phosphorus: 4.8 mg/dL — ABNORMAL HIGH (ref 2.5–4.6)
Phosphorus: 3.8 mg/dL (ref 2.5–4.6)
Phosphorus: 3.2 mg/dL (ref 2.5–4.6)
Potassium: 6.3 mmol/L (ref 3.5–5.1)
Potassium: 5.6 mmol/L — ABNORMAL HIGH (ref 3.5–5.1)
Potassium: 5.7 mmol/L — ABNORMAL HIGH (ref 3.5–5.1)
Sodium: 121 mmol/L — ABNORMAL LOW (ref 135–145)
Sodium: 123 mmol/L — ABNORMAL LOW (ref 135–145)
Sodium: 122 mmol/L — ABNORMAL LOW (ref 135–145)

## 2022-01-23 LAB — I-STAT VENOUS BLOOD GAS, ED
Acid-base deficit: 10 mmol/L — ABNORMAL HIGH (ref 0.0–2.0)
Acid-base deficit: 11 mmol/L — ABNORMAL HIGH (ref 0.0–2.0)
Bicarbonate: 14.1 mmol/L — ABNORMAL LOW (ref 20.0–28.0)
Bicarbonate: 14.4 mmol/L — ABNORMAL LOW (ref 20.0–28.0)
Calcium, Ion: 1.02 mmol/L — ABNORMAL LOW (ref 1.15–1.40)
Calcium, Ion: 1.07 mmol/L — ABNORMAL LOW (ref 1.15–1.40)
HCT: 40 % (ref 39.0–52.0)
HCT: 40 % (ref 39.0–52.0)
Hemoglobin: 13.6 g/dL (ref 13.0–17.0)
Hemoglobin: 13.6 g/dL (ref 13.0–17.0)
O2 Saturation: 89 %
O2 Saturation: 96 %
Potassium: 5.5 mmol/L — ABNORMAL HIGH (ref 3.5–5.1)
Potassium: 5.5 mmol/L — ABNORMAL HIGH (ref 3.5–5.1)
Sodium: 119 mmol/L — CL (ref 135–145)
Sodium: 120 mmol/L — ABNORMAL LOW (ref 135–145)
TCO2: 15 mmol/L — ABNORMAL LOW (ref 22–32)
TCO2: 15 mmol/L — ABNORMAL LOW (ref 22–32)
pCO2, Ven: 28.6 mmHg — ABNORMAL LOW (ref 44–60)
pCO2, Ven: 28.6 mmHg — ABNORMAL LOW (ref 44–60)
pH, Ven: 7.302 (ref 7.25–7.43)
pH, Ven: 7.308 (ref 7.25–7.43)
pO2, Ven: 61 mmHg — ABNORMAL HIGH (ref 32–45)
pO2, Ven: 86 mmHg — ABNORMAL HIGH (ref 32–45)

## 2022-01-23 LAB — HEPATIC FUNCTION PANEL
ALT: 23 U/L (ref 0–44)
AST: 25 U/L (ref 15–41)
Albumin: 4 g/dL (ref 3.5–5.0)
Alkaline Phosphatase: 73 U/L (ref 38–126)
Bilirubin, Direct: 0.2 mg/dL (ref 0.0–0.2)
Indirect Bilirubin: 0.6 mg/dL (ref 0.3–0.9)
Total Bilirubin: 0.8 mg/dL (ref 0.3–1.2)
Total Protein: 7.3 g/dL (ref 6.5–8.1)

## 2022-01-23 LAB — TSH: TSH: 0.796 u[IU]/mL (ref 0.350–4.500)

## 2022-01-23 LAB — MAGNESIUM: Magnesium: 2.3 mg/dL (ref 1.7–2.4)

## 2022-01-23 LAB — VITAMIN B12: Vitamin B-12: 470 pg/mL (ref 180–914)

## 2022-01-23 LAB — LACTIC ACID, PLASMA
Lactic Acid, Venous: 0.7 mmol/L (ref 0.5–1.9)
Lactic Acid, Venous: 0.8 mmol/L (ref 0.5–1.9)

## 2022-01-23 LAB — OSMOLALITY: Osmolality: 262 mOsm/kg — ABNORMAL LOW (ref 275–295)

## 2022-01-23 LAB — CBG MONITORING, ED: Glucose-Capillary: 82 mg/dL (ref 70–99)

## 2022-01-23 LAB — HIV ANTIBODY (ROUTINE TESTING W REFLEX): HIV Screen 4th Generation wRfx: NONREACTIVE

## 2022-01-23 LAB — ETHANOL: Alcohol, Ethyl (B): <10 mg/dL (ref <10)

## 2022-01-23 LAB — LIPASE, BLOOD: Lipase: 45 U/L (ref 11–51)

## 2022-01-23 LAB — AMMONIA: Ammonia: 39 umol/L — ABNORMAL HIGH (ref 9–35)

## 2022-01-23 MED ORDER — HYDROXYZINE HCL 10 MG PO TABS
10.0000 mg | ORAL_TABLET | Freq: Two times a day (BID) | ORAL | Status: DC | PRN
  Administered 2022-01-23 – 2022-01-24 (×2): 10 mg via ORAL
  Filled 2022-01-23 (×2): qty 1

## 2022-01-23 MED ORDER — CARVEDILOL 3.125 MG PO TABS
3.1250 mg | ORAL_TABLET | Freq: Two times a day (BID) | ORAL | Status: DC
  Administered 2022-01-23 – 2022-01-27 (×8): 3.125 mg via ORAL
  Filled 2022-01-23 (×9): qty 1

## 2022-01-23 MED ORDER — RIFAXIMIN 550 MG PO TABS
550.0000 mg | ORAL_TABLET | Freq: Two times a day (BID) | ORAL | Status: DC
  Administered 2022-01-23 – 2022-01-27 (×9): 550 mg via ORAL
  Filled 2022-01-23 (×10): qty 1

## 2022-01-23 MED ORDER — SODIUM BICARBONATE 8.4 % IV SOLN
50.0000 meq | Freq: Once | INTRAVENOUS | Status: AC
  Administered 2022-01-23: 50 meq via INTRAVENOUS
  Filled 2022-01-23: qty 50

## 2022-01-23 MED ORDER — THIAMINE HCL 100 MG/ML IJ SOLN
100.0000 mg | Freq: Every day | INTRAMUSCULAR | Status: DC
  Administered 2022-01-23 – 2022-01-24 (×2): 100 mg via INTRAVENOUS
  Filled 2022-01-23 (×2): qty 2

## 2022-01-23 MED ORDER — HYDROMORPHONE HCL 1 MG/ML IJ SOLN
0.5000 mg | INTRAMUSCULAR | Status: DC | PRN
  Administered 2022-01-23: 0.5 mg via INTRAVENOUS
  Filled 2022-01-23: qty 0.5

## 2022-01-23 MED ORDER — LORAZEPAM 2 MG/ML IJ SOLN
0.0000 mg | Freq: Four times a day (QID) | INTRAMUSCULAR | Status: DC
  Administered 2022-01-23: 2 mg via INTRAVENOUS
  Filled 2022-01-23: qty 1

## 2022-01-23 MED ORDER — LORAZEPAM 1 MG PO TABS: 0.0000 mg | ORAL_TABLET | Freq: Two times a day (BID) | ORAL | Status: DC

## 2022-01-23 MED ORDER — LORAZEPAM 1 MG PO TABS
0.0000 mg | ORAL_TABLET | Freq: Four times a day (QID) | ORAL | Status: DC
  Administered 2022-01-24: 2 mg via ORAL
  Filled 2022-01-23: qty 2

## 2022-01-23 MED ORDER — DEXTROSE-NACL 5-0.9 % IV SOLN
INTRAVENOUS | Status: DC
Start: 2022-01-23 — End: 2022-01-23

## 2022-01-23 MED ORDER — LEVOTHYROXINE SODIUM 88 MCG PO TABS
88.0000 ug | ORAL_TABLET | Freq: Every day | ORAL | Status: DC
  Administered 2022-01-23 – 2022-01-27 (×5): 88 ug via ORAL
  Filled 2022-01-23 (×5): qty 1

## 2022-01-23 MED ORDER — ACETAMINOPHEN 325 MG PO TABS
650.0000 mg | ORAL_TABLET | Freq: Four times a day (QID) | ORAL | Status: DC | PRN
Start: 2022-01-23 — End: 2022-01-27
  Administered 2022-01-24 (×3): 650 mg via ORAL
  Filled 2022-01-23 (×3): qty 2

## 2022-01-23 MED ORDER — ARIPIPRAZOLE 5 MG PO TABS
5.0000 mg | ORAL_TABLET | Freq: Every day | ORAL | Status: DC
  Administered 2022-01-23 – 2022-01-27 (×5): 5 mg via ORAL
  Filled 2022-01-23 (×6): qty 1

## 2022-01-23 MED ORDER — ONDANSETRON HCL 4 MG PO TABS
4.0000 mg | ORAL_TABLET | Freq: Four times a day (QID) | ORAL | Status: DC | PRN
  Administered 2022-01-24: 4 mg via ORAL
  Filled 2022-01-23: qty 1

## 2022-01-23 MED ORDER — LATANOPROST 0.005 % OP SOLN
1.0000 [drp] | Freq: Every day | OPHTHALMIC | Status: DC
  Administered 2022-01-23 – 2022-01-26 (×4): 1 [drp] via OPHTHALMIC
  Filled 2022-01-23: qty 2.5

## 2022-01-23 MED ORDER — DEXTROSE-NACL 5-0.9 % IV SOLN
INTRAVENOUS | Status: DC
Start: 2022-01-23 — End: 2022-01-24

## 2022-01-23 MED ORDER — SODIUM BICARBONATE 650 MG PO TABS
650.0000 mg | ORAL_TABLET | Freq: Three times a day (TID) | ORAL | Status: DC
  Administered 2022-01-23 – 2022-01-26 (×11): 650 mg via ORAL
  Filled 2022-01-23 (×10): qty 1

## 2022-01-23 MED ORDER — PRAZOSIN HCL 1 MG PO CAPS
1.0000 mg | ORAL_CAPSULE | Freq: Two times a day (BID) | ORAL | Status: DC
  Administered 2022-01-23 – 2022-01-25 (×4): 1 mg via ORAL
  Filled 2022-01-23 (×5): qty 1

## 2022-01-23 MED ORDER — MESALAMINE 1.2 G PO TBEC
4.8000 g | DELAYED_RELEASE_TABLET | Freq: Every day | ORAL | Status: DC
  Administered 2022-01-23 – 2022-01-27 (×5): 4.8 g via ORAL
  Filled 2022-01-23 (×5): qty 4

## 2022-01-23 MED ORDER — LACTULOSE 10 GM/15ML PO SOLN
30.0000 g | Freq: Three times a day (TID) | ORAL | Status: DC
  Administered 2022-01-23 – 2022-01-27 (×11): 30 g via ORAL
  Filled 2022-01-23 (×13): qty 45

## 2022-01-23 MED ORDER — ONDANSETRON HCL 4 MG/2ML IJ SOLN: 4.0000 mg | Freq: Four times a day (QID) | INTRAMUSCULAR | Status: DC | PRN

## 2022-01-23 MED ORDER — SODIUM ZIRCONIUM CYCLOSILICATE 10 G PO PACK
10.0000 g | PACK | Freq: Once | ORAL | Status: AC
  Administered 2022-01-23: 10 g via ORAL
  Filled 2022-01-23: qty 1

## 2022-01-23 MED ORDER — PNEUMOCOCCAL VAC POLYVALENT 25 MCG/0.5ML IJ INJ
0.5000 mL | INJECTION | INTRAMUSCULAR | Status: DC
  Filled 2022-01-23: qty 0.5

## 2022-01-23 MED ORDER — SODIUM CHLORIDE 0.9 % IV SOLN: Freq: Once | INTRAVENOUS | Status: AC

## 2022-01-23 MED ORDER — SODIUM CHLORIDE 0.9 % IV SOLN: INTRAVENOUS | Status: DC

## 2022-01-23 MED ORDER — LORAZEPAM 2 MG/ML IJ SOLN: 0.0000 mg | Freq: Two times a day (BID) | INTRAMUSCULAR | Status: DC

## 2022-01-23 MED ORDER — PANTOPRAZOLE SODIUM 40 MG PO TBEC
40.0000 mg | DELAYED_RELEASE_TABLET | Freq: Two times a day (BID) | ORAL | Status: DC
  Administered 2022-01-23 – 2022-01-27 (×9): 40 mg via ORAL
  Filled 2022-01-23 (×9): qty 1

## 2022-01-23 MED ORDER — FLUOXETINE HCL 20 MG PO CAPS
20.0000 mg | ORAL_CAPSULE | Freq: Every day | ORAL | Status: DC
  Administered 2022-01-23 – 2022-01-27 (×5): 20 mg via ORAL
  Filled 2022-01-23 (×5): qty 1

## 2022-01-23 MED ORDER — LACTULOSE 10 GM/15ML PO SOLN: 30.0000 g | Freq: Three times a day (TID) | ORAL | Status: DC | PRN

## 2022-01-23 MED ORDER — ACETAMINOPHEN 650 MG RE SUPP: 650.0000 mg | Freq: Four times a day (QID) | RECTAL | Status: DC | PRN

## 2022-01-23 NOTE — Assessment & Plan Note (Addendum)
-  Continue home Abilify ?

## 2022-01-23 NOTE — Assessment & Plan Note (Addendum)
Likely secondary to alcohol use. No ascites on prior imaging. ?

## 2022-01-23 NOTE — Assessment & Plan Note (Addendum)
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  He is also on losartan and Aldactone. Improving. ?-Continue to hold home losartan and Aldactone ?-outpatient follow up ?

## 2022-01-23 NOTE — ED Notes (Signed)
RN updated pts wife on POC, pt agreed. Wife stated pt drinks bottle of liquor/wine a night and 2 [place].  ?

## 2022-01-23 NOTE — ED Provider Notes (Signed)
?[facility] ?Provider Note ? ? ?CSN: [id number] ?Arrival date & time: 01/22/22  1939 ? ?  ? ?History ? ?Chief Complaint  ?Patient presents with  ? Fatigue  ? Dizziness  ? ? ?Jacob Adams is a 56 y.o. male with a history of hypertension, CHF, ulcerative colitis, prior GI bleed, bipolar disorder, pancreatitis, and alcohol use disorder who presents to the emergency department with complaints of fatigue and lightheadedness over the past 3 weeks.  Patient reports that he feels fairly constantly lightheaded, this is worse with position changes, he almost passed out once but has not had a full syncopal event.  With this he has had decreased p.o. intake with nausea and daily 3-4 episodes of emesis.  He is not really keeping much down.  He occasionally has some diarrhea.  Also notes constant chest and upper abdominal pain, abdominal pain feels like a burning sensation.  No alleviating or aggravating factors to this.  He does report daily alcohol use states that he drinks about 4 quarts of alcohol per day.  His last drink was yesterday morning.  He states that he does feel somewhat shaky and has a mild headache.  He has not had any recent head injury.  Denies fever, shortness of breath, hemoptysis, hematemesis, melena, hematochezia, or hallucinations. ? ?HPI ? ?  ? ?Home Medications ?Prior to Admission medications   ?Medication Sig Start Date End Date Taking? Authorizing Provider  ?ARIPiprazole (ABILIFY) 20 MG tablet Take 10 mg by mouth daily.    [provider]  ?atorvastatin (LIPITOR) 80 MG tablet Take 40 mg by mouth at bedtime.     [provider]  ?carvedilol (COREG) 6.25 MG tablet Take 6.25 mg by mouth 2 (two) times daily with a meal.    [provider]  ?diclofenac Sodium (VOLTAREN) 1 % GEL Apply 1 application topically 4 (four) times daily.     [provider]  ?DULoxetine (CYMBALTA) 60 MG capsule Take 60 mg by mouth at bedtime.     [provider]  ?ferrous sulfate 325 (65 FE) MG tablet Take 325 mg by mouth 2 (two) times daily with a meal.    [provider]  ?furosemide (LASIX) 40 MG tablet Take 80 mg by mouth daily.     [provider]  ?lactulose (CHRONULAC) 10 GM/15ML solution Take 45 mLs (30 g total) by mouth 3 (three) times daily. 07/24/20   Shelly Coss, MD  ?latanoprost (XALATAN) 0.005 % ophthalmic solution Place 1 drop into both eyes at bedtime.    [provider]  ?levothyroxine (SYNTHROID) 88 MCG tablet Take 88 mcg by mouth daily before breakfast.    [provider]  ?losartan (COZAAR) 50 MG tablet Take 1 tablet (50 mg total) by mouth daily. ?Patient taking differently: Take 50 mg by mouth daily with supper.  12/29/18   Cherene Altes, MD  ?mesalamine (LIALDA) 1.2 g EC tablet Take 4 tablets (4.8 g total) by mouth daily. 07/25/20 08/24/20  Shelly Coss, MD  ?mirtazapine (REMERON) 15 MG tablet Take 15 mg by mouth at bedtime.    [provider]  ?nicotine (NICODERM CQ - DOSED IN MG/24 HR) 7 mg/24hr patch Place 7 mg onto the skin daily as needed (smoking cessation). ?Patient not taking: Reported on 08/10/2020    [provider]  ?pantoprazole (PROTONIX) 40 MG tablet Take 1 tablet (40 mg total) by mouth 2 (two) times daily. 05/24/18   Jacqlyn Larsen, PA-C  ?  Potassium Chloride ER 20 MEQ TBCR Take 20 mEq by mouth daily. 07/24/20   Shelly Coss, MD  ?rifaximin (XIFAXAN) 550 MG TABS tablet Take 1 tablet (550 mg total) by mouth 2 (two) times daily. 07/24/20   Shelly Coss, MD  ?spironolactone (ALDACTONE) 25 MG tablet Take 1 tablet (25 mg total) by mouth daily. 07/24/20   Shelly Coss, MD  ?thiamine 100 MG tablet Take 1 tablet (100 mg total) by mouth daily. 07/24/20   Shelly Coss, MD  ?traZODone (DESYREL) 100 MG tablet Take 100 mg by mouth at bedtime. ?Patient not taking: Reported on 08/10/2020    [provider]  ?   ? ?Allergies    ?Uncoded nonscreenable allergen,  Ibuprofen, Nsaids, Penicillins, and [name]   ? ?Review of Systems   ?Review of Systems  ?Constitutional:  Positive for fatigue. Negative for fever.  ?Respiratory:  Negative for shortness of breath.   ?Cardiovascular:  Positive for chest pain.  ?Gastrointestinal:  Positive for abdominal pain, diarrhea, nausea and vomiting. Negative for blood in stool.  ?Neurological:  Positive for light-headedness. Negative for syncope.  ?All other systems reviewed and are negative. ? ?Physical Exam ?Updated Vital Signs ?BP 115/74 (BP Location: Left Arm)   Pulse 76   Temp 98.3 ?F (36.8 ?C) (Oral)   Resp 16   Ht 5' 8"  (1.727 m)   Wt 101.6 kg   SpO2 98%   BMI 34.06 kg/m?  ?Physical Exam ?Vitals and nursing note reviewed.  ?Constitutional:   ?   General: He is not in acute distress. ?   Appearance: He is well-developed. He is not toxic-appearing.  ?HENT:  ?   Head: Normocephalic and atraumatic.  ?Eyes:  ?   General:     ?   Right eye: No discharge.     ?   Left eye: No discharge.  ?   Extraocular Movements: Extraocular movements intact.  ?   Conjunctiva/sclera: Conjunctivae normal.  ?   Comments: PERRL.   ?Cardiovascular:  ?   Rate and Rhythm: Normal rate and regular rhythm.  ?Pulmonary:  ?   Effort: No respiratory distress.  ?   Breath sounds: Normal breath sounds. No wheezing or rales.  ?Abdominal:  ?   General: There is no distension.  ?   Palpations: Abdomen is soft.  ?   Tenderness: There is abdominal tenderness (mild) in the epigastric area.  ?Musculoskeletal:  ?   Cervical back: Neck supple.  ?   Right lower leg: No edema.  ?   Left lower leg: No edema.  ?Skin: ?   General: Skin is warm and dry.  ?Neurological:  ?   Mental Status: He is alert.  ?   Comments: Clear speech.   ?Psychiatric:     ?   Behavior: Behavior normal.  ? ? ?ED Results / Procedures / Treatments   ?Labs ?(all labs ordered are listed, but only abnormal results are displayed) ?Labs Reviewed  ?BASIC METABOLIC PANEL - Abnormal; Notable for the  following components:  ?    Result Value  ? Sodium 120 (*)   ? Potassium 5.3 (*)   ? Chloride 92 (*)   ? CO2 12 (*)   ? BUN 22 (*)   ? Creatinine, Ser 1.82 (*)   ? Calcium 8.8 (*)   ? GFR, Estimated 43 (*)   ? Anion gap 16 (*)   ? All other components within normal limits  ?CBC - Abnormal; Notable for the following components:  ?  RBC 3.84 (*)   ? Hemoglobin 12.8 (*)   ? HCT 38.2 (*)   ? All other components within normal limits  ?TROPONIN I (HIGH SENSITIVITY)  ?TROPONIN I (HIGH SENSITIVITY)  ? ? ?EKG ?EKG Interpretation ? ?Date/Time:  Wednesday January 22 2022 19:58:40 EDT ?Ventricular Rate:  77 ?PR Interval:  186 ?QRS Duration: 88 ?QT Interval:  364 ?QTC Calculation: 411 ?R Axis:   -11 ?Text Interpretation: Normal sinus rhythm Normal ECG When compared with ECG of 22-Jan-2022 19:28, PREVIOUS ECG IS PRESENT Confirmed by Merrily Pew [phone]) on 01/23/2022 1:37:35 AM ? ?Radiology ?DG Chest 2 View ? ?Result Date: 01/22/2022 ?CLINICAL DATA:  Chest pain. EXAM: CHEST - 2 VIEW COMPARISON:  Chest x-ray 07/23/2020. FINDINGS: The heart size and mediastinal contours are within normal limits. Both lungs are clear. The visualized skeletal structures are unremarkable. IMPRESSION: No active cardiopulmonary disease. Electronically Signed   By: Ronney Asters M.D.   On: 01/22/2022 20:35   ? ?Procedures ?Marland KitchenCritical Care ?Performed by: Amaryllis Dyke, PA-C ?Authorized by: Amaryllis Dyke, PA-C  ?  ?CRITICAL CARE ?Performed by: Kennith Maes ? ? ?Total critical care time: 30 minutes ? ?Critical care time was exclusive of separately billable procedures and treating other patients. ? ?Critical care was necessary to treat or prevent imminent or life-threatening deterioration. ? ?Critical care was time spent personally by me on the following activities: development of treatment plan with patient and/or surrogate as well as nursing, discussions with consultants, evaluation of patient's response to treatment, examination of  patient, obtaining history from patient or surrogate, ordering and performing treatments and interventions, ordering and review of laboratory studies, ordering and review of radiographic studies, pulse oximetry and re-

## 2022-01-23 NOTE — Assessment & Plan Note (Addendum)
Currently normotensive off of home medication. Coreg reduced to 3.125 mg BID on admission ?-Continue Coreg ?

## 2022-01-23 NOTE — Assessment & Plan Note (Addendum)
Likely from acidosis. Patient given a dose of Lokelma with initial improvement. ? ?

## 2022-01-23 NOTE — Assessment & Plan Note (Addendum)
TTE in 12/2018 with LVEF of 25%, diffuse hypokinesis with some regional variation and indeterminate DD.  On Lasix, Coreg, losartan and spironolactone as an outpatient; diuretics held secondary to hyponatremia and AKI. Losartan held secondary to AKI. ?-Continue Coreg ?-Hold diuretics and losartan for now ?

## 2022-01-23 NOTE — Assessment & Plan Note (Addendum)
Possibly from hyponatremia, alcohol and/or Ativan. VBG obtained with low pCO2. Appears to have resolved. ?

## 2022-01-23 NOTE — Assessment & Plan Note (Addendum)
Likely from alcohol.  Lactic acid within normal.  Not diabetic.  No significant azotemia.  ?-d/c NA tabs in AM if stable ?
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
## 2022-01-23 NOTE — Assessment & Plan Note (Addendum)
Reportedly drinks about 4 quarts a day.  Last drink day before admission. Patient reports interest in quitting as he has tried in the past. History of DTs.  ?-Librium with taper complete ?-Continue CIWA ?-Continue thiamine, folic acid and multivitamin ?-TOC consult for counseling and resources ?

## 2022-01-23 NOTE — Assessment & Plan Note (Addendum)
Possible gastritis from alcohol abuse.  Serial troponin and EKG reassuring. Appears to be resolved. ?-Continue Protonix and Zofran ?

## 2022-01-23 NOTE — Hospital Course (Addendum)
55 year old M with history of EtOH abuse, fatty liver, systolic CHF/cardiomyopathy, ulcerative colitis, GIB, HTN, depression, BPD, hypothyroidism and sleep apnea presenting with "not feeling well" and found to have evidence of hyponatremia. ?

## 2022-01-23 NOTE — Assessment & Plan Note (Addendum)
Counseled on admission. Nicotine patch offered on admission. ?

## 2022-01-23 NOTE — Assessment & Plan Note (Addendum)
-  Continue home Lialda ?

## 2022-01-23 NOTE — Assessment & Plan Note (Addendum)
-   Continue home Synthroid °

## 2022-01-23 NOTE — Assessment & Plan Note (Addendum)
Per report, patient's wife reports some blood when he wipes but no bloody bowel movements. Hemoglobin is stable. ?-Continue Protonix ?

## 2022-01-24 DIAGNOSIS — E871 Hypo-osmolality and hyponatremia: Secondary | ICD-10-CM | POA: Diagnosis not present

## 2022-01-24 LAB — COMPREHENSIVE METABOLIC PANEL
ALT: 20 U/L (ref 0–44)
AST: 26 U/L (ref 15–41)
Albumin: 3.4 g/dL — ABNORMAL LOW (ref 3.5–5.0)
Alkaline Phosphatase: 59 U/L (ref 38–126)
Anion gap: 8 (ref 5–15)
BUN: 20 mg/dL (ref 6–20)
CO2: 17 mmol/L — ABNORMAL LOW (ref 22–32)
Calcium: 8.6 mg/dL — ABNORMAL LOW (ref 8.9–10.3)
Chloride: 102 mmol/L (ref 98–111)
Creatinine, Ser: 1.82 mg/dL — ABNORMAL HIGH (ref 0.61–1.24)
GFR, Estimated: 43 mL/min — ABNORMAL LOW (ref >60)
Glucose, Bld: 117 mg/dL — ABNORMAL HIGH (ref 70–99)
Potassium: 5.4 mmol/L — ABNORMAL HIGH (ref 3.5–5.1)
Sodium: 127 mmol/L — ABNORMAL LOW (ref 135–145)
Total Bilirubin: 0.7 mg/dL (ref 0.3–1.2)
Total Protein: 6.1 g/dL — ABNORMAL LOW (ref 6.5–8.1)

## 2022-01-24 LAB — RENAL FUNCTION PANEL
Albumin: 3.5 g/dL (ref 3.5–5.0)
Anion gap: 5 (ref 5–15)
BUN: 21 mg/dL — ABNORMAL HIGH (ref 6–20)
CO2: 19 mmol/L — ABNORMAL LOW (ref 22–32)
Calcium: 8.3 mg/dL — ABNORMAL LOW (ref 8.9–10.3)
Chloride: 101 mmol/L (ref 98–111)
Creatinine, Ser: 2.1 mg/dL — ABNORMAL HIGH (ref 0.61–1.24)
GFR, Estimated: 36 mL/min — ABNORMAL LOW (ref >60)
Glucose, Bld: 120 mg/dL — ABNORMAL HIGH (ref 70–99)
Phosphorus: 3.9 mg/dL (ref 2.5–4.6)
Potassium: 4.9 mmol/L (ref 3.5–5.1)
Sodium: 125 mmol/L — ABNORMAL LOW (ref 135–145)

## 2022-01-24 LAB — CBC
HCT: 33.2 % — ABNORMAL LOW (ref 39.0–52.0)
Hemoglobin: 11.7 g/dL — ABNORMAL LOW (ref 13.0–17.0)
MCH: 33.8 pg (ref 26.0–34.0)
MCHC: 35.2 g/dL (ref 30.0–36.0)
MCV: 96 fL (ref 80.0–100.0)
Platelets: 205 10*3/uL (ref 150–400)
RBC: 3.46 MIL/uL — ABNORMAL LOW (ref 4.22–5.81)
RDW: 13.3 % (ref 11.5–15.5)
WBC: 3.6 10*3/uL — ABNORMAL LOW (ref 4.0–10.5)
nRBC: 0 % (ref 0.0–0.2)

## 2022-01-24 LAB — BRAIN NATRIURETIC PEPTIDE: B Natriuretic Peptide: 36 pg/mL (ref 0.0–100.0)

## 2022-01-24 LAB — PROTIME-INR
INR: 1 (ref 0.8–1.2)
Prothrombin Time: 13.2 seconds (ref 11.4–15.2)

## 2022-01-24 LAB — RPR: RPR Ser Ql: NONREACTIVE

## 2022-01-24 LAB — MAGNESIUM: Magnesium: 2.2 mg/dL (ref 1.7–2.4)

## 2022-01-24 LAB — AMMONIA: Ammonia: 37 umol/L — ABNORMAL HIGH (ref 9–35)

## 2022-01-24 LAB — POTASSIUM: Potassium: 5.3 mmol/L — ABNORMAL HIGH (ref 3.5–5.1)

## 2022-01-24 LAB — SODIUM: Sodium: 132 mmol/L — ABNORMAL LOW (ref 135–145)

## 2022-01-24 LAB — APTT: aPTT: 34 seconds (ref 24–36)

## 2022-01-24 LAB — LIPASE, BLOOD: Lipase: 50 U/L (ref 11–51)

## 2022-01-24 LAB — PHOSPHORUS: Phosphorus: 3.5 mg/dL (ref 2.5–4.6)

## 2022-01-24 MED ORDER — THIAMINE HCL 100 MG PO TABS
100.0000 mg | ORAL_TABLET | Freq: Every day | ORAL | Status: DC
  Administered 2022-01-25 – 2022-01-27 (×3): 100 mg via ORAL
  Filled 2022-01-24 (×3): qty 1

## 2022-01-24 MED ORDER — CHLORDIAZEPOXIDE HCL 25 MG PO CAPS
25.0000 mg | ORAL_CAPSULE | Freq: Every day | ORAL | Status: AC
  Administered 2022-01-27: 25 mg via ORAL
  Filled 2022-01-24: qty 1

## 2022-01-24 MED ORDER — LORAZEPAM 1 MG PO TABS
1.0000 mg | ORAL_TABLET | ORAL | Status: AC | PRN
  Administered 2022-01-25: 2 mg via ORAL
  Administered 2022-01-27: 1 mg via ORAL
  Filled 2022-01-24: qty 1
  Filled 2022-01-24: qty 2

## 2022-01-24 MED ORDER — BUTALBITAL-APAP-CAFFEINE 50-325-40 MG PO TABS
2.0000 | ORAL_TABLET | Freq: Every day | ORAL | Status: DC | PRN
  Administered 2022-01-24: 2 via ORAL
  Filled 2022-01-24: qty 2

## 2022-01-24 MED ORDER — CHLORDIAZEPOXIDE HCL 25 MG PO CAPS
25.0000 mg | ORAL_CAPSULE | ORAL | Status: AC
  Administered 2022-01-26 (×2): 25 mg via ORAL
  Filled 2022-01-24 (×2): qty 1

## 2022-01-24 MED ORDER — CHLORDIAZEPOXIDE HCL 25 MG PO CAPS
25.0000 mg | ORAL_CAPSULE | Freq: Three times a day (TID) | ORAL | Status: AC
  Administered 2022-01-25 (×3): 25 mg via ORAL
  Filled 2022-01-24 (×3): qty 1

## 2022-01-24 MED ORDER — ENSURE ENLIVE PO LIQD
237.0000 mL | Freq: Two times a day (BID) | ORAL | Status: DC
  Administered 2022-01-24 – 2022-01-27 (×5): 237 mL via ORAL

## 2022-01-24 MED ORDER — FOLIC ACID 1 MG PO TABS
1.0000 mg | ORAL_TABLET | Freq: Every day | ORAL | Status: DC
  Administered 2022-01-24 – 2022-01-27 (×4): 1 mg via ORAL
  Filled 2022-01-24 (×4): qty 1

## 2022-01-24 MED ORDER — LORAZEPAM 2 MG/ML IJ SOLN: 1.0000 mg | INTRAMUSCULAR | Status: AC | PRN

## 2022-01-24 MED ORDER — ADULT MULTIVITAMIN W/MINERALS CH
1.0000 | ORAL_TABLET | Freq: Every day | ORAL | Status: DC
  Administered 2022-01-24 – 2022-01-27 (×4): 1 via ORAL
  Filled 2022-01-24 (×4): qty 1

## 2022-01-24 MED ORDER — CHLORDIAZEPOXIDE HCL 25 MG PO CAPS
25.0000 mg | ORAL_CAPSULE | Freq: Four times a day (QID) | ORAL | Status: AC
  Administered 2022-01-24 (×4): 25 mg via ORAL
  Filled 2022-01-24 (×4): qty 1

## 2022-01-24 NOTE — Assessment & Plan Note (Signed)
Resolved

## 2022-01-24 NOTE — TOC Progression Note (Signed)
Transition of Care (TOC) - Progression Note  ? ? ?Patient Details  ?Name: VELTON ROSELLE ?MRN: [id number] ?Date of Birth: 1965-12-03 ? ?Transition of Care (TOC) CM/SW Contact  ?Zenon Mayo, RN ?Phone Number: ?01/24/2022, 8:09 AM ? ?Clinical Narrative:    ? ?Transition of Care (TOC) Screening Note ? ? ?Patient Details  ?Name: CHESTER SIBERT ?Date of Birth: 06/24/1966 ? ? ?Transition of Care (TOC) CM/SW Contact:    ?Zenon Mayo, RN ?Phone Number: ?01/24/2022, 8:09 AM ? ? ? ?Transition of Care Department [facility]) has reviewed patient and no TOC needs have been identified at this time. We will continue to monitor patient advancement through interdisciplinary progression rounds. If new patient transition needs arise, please place a TOC consult. ?  ? ? ?  ?  ? ?Expected Discharge Plan and Services ?  ?  ?  ?  ?  ?                ?  ?  ?  ?  ?  ?  ?  ?  ?  ?  ? ? ?Social Determinants of Health (SDOH) Interventions ?  ? ?Readmission Risk Interventions ?   ? View : No data to display.  ?  ?  ?  ? ? ?

## 2022-01-24 NOTE — Progress Notes (Signed)
Initial Nutrition Assessment ? ?DOCUMENTATION CODES:  ? ?Obesity unspecified ? ?INTERVENTION:  ? ?-MVI with minerals daily ?-Ensure Enlive po BID, each supplement provides 350 kcal and 20 grams of protein ?-Liberalize diet to 2 gram sodium ? ?NUTRITION DIAGNOSIS:  ? ?Increased nutrient needs related to chronic illness (CHF) as evidenced by estimated needs. ? ?GOAL:  ? ?Patient will meet greater than or equal to 90% of their needs ? ?MONITOR:  ? ?PO intake, Supplement acceptance, Diet advancement, Labs, Weight trends, Skin, I & O's ? ?REASON FOR ASSESSMENT:  ? ?Malnutrition Screening Tool ?  ? ?ASSESSMENT:  ? ?56 year old M with history of EtOH abuse, fatty liver, systolic CHF/cardiomyopathy, ulcerative colitis, GIB, HTN, depression, BPD, hypothyroidism and sleep apnea presenting with "not feeling well". ? ?Pt admitted with hyponatremia and acute metabolic encephalopathy.  ? ?Reviewed I/O's:  -617 ml x 24 hours ? ?UOP: 1.1 L x 24 hours  ? ?Pt unavailable at time of visit. Attempted to speak with pt via call to hospital room phone, however, unable to reach. RD unable to obtain further nutrition-related history or complete nutrition-focused physical exam at this time.   ? ?Pt with good appetite. Noted meal completions 75%. Given increased nutritional needs, pt would benefit from addition of oral nutrition supplements as well as diet advancement to increase variety of meal selections in attempt to optimize oral intake. Suspect diet of poor nutritional quality PTA secondary to ETOH abuse.  ? ?Reviewed wt hx; noted distant history of weight loss.  ? ?Medications reviewed and include lactulose, folic acid, thiamine, and dextrose 5%-0.9% sodium chloride infusion @ 125 ml/hr. ? ?Labs reviewed: Na: 127, K: 5.3.   ? ?Diet Order:   ?Diet Order   ? ?       ?  Diet Heart Room service appropriate? Yes; Fluid consistency: Thin  Diet effective now       ?  ? ?  ?  ? ?  ? ? ?EDUCATION NEEDS:  ? ?No education needs have been  identified at this time ? ?Skin:  Skin Assessment: Reviewed RN Assessment ? ?Last BM:  01/24/22 ? ?Height:  ? ?Ht Readings from Last 1 Encounters:  ?01/23/22 5' 10"  (1.778 m)  ? ? ?Weight:  ? ?Wt Readings from Last 1 Encounters:  ?01/24/22 104.5 kg  ? ? ?Ideal Body Weight:  75.5 kg ? ?BMI:  Body mass index is 33.06 kg/m?. ? ?Estimated Nutritional Needs:  ? ?Kcal:  2250-2450 ? ?Protein:  115-130 grams ? ?Fluid:  > 2 L ? ? ? ?Loistine Chance, RD, LDN, CDCES ?Registered Dietitian II ?Certified Diabetes Care and Education Specialist ?Please refer to [facility] for RD and/or RD on-call/weekend/after hours pager  ?

## 2022-01-24 NOTE — Progress Notes (Signed)
? ?PROGRESS NOTE ? ? ? ?Jacob Adams  WUJ:[id number] DOB: 05-16-1966 DOA: 01/22/2022 ?PCP: Julieanne Manson, MD ? ? ?Brief Narrative: ?56 year old M with history of EtOH abuse, fatty liver, systolic CHF/cardiomyopathy, ulcerative colitis, GIB, HTN, depression, BPD, hypothyroidism and sleep apnea presenting with "not feeling well" and found to have evidence of hyponatremia. IV fluids initiated. ? ? ? ?Assessment and Plan: ?* Hyponatremia ?Likely beer potomania and possible dehydration.  He is also on losartan and Aldactone. Improving. ?-Discontinue IV fluids ?-Continue to hold home losartan and Aldactone ?-Continue BMP/sodium checks ? ?Acute metabolic encephalopathy ?Possibly from hyponatremia, alcohol and/or Ativan. VBG obtained with low pCO2. Appears to have resolved. ? ?Alcohol use disorder, severe, dependence (HCC) ?Reportedly drinks about 4 quarts a day.  Last drink day before admission. Patient reports interest in quitting as he has tried in the past. History of DTs. Currently with some withdrawal signs. ?-Librium with taper ?-Continue CIWA ?-Continue thiamine, folic acid and multivitamin ?-TOC consult for counseling and resources ? ?Nausea, vomiting and epigastric pain ?Possible gastritis from alcohol abuse.  Serial troponin and EKG reassuring. Appears to be resolved. ?-Continue Protonix and Zofran ? ?High anion gap metabolic acidosis ?Likely from alcohol.  Lactic acid within normal.  Not diabetic.  No significant azotemia. Started on D5 IV fluids. Started on sodium bicarbonate tablets. Acidosis is persistent. ?-Continue sodium bicarbonate tablets ? ?Fatty liver ?Likely secondary to alcohol use. No ascites on prior imaging. ? ?Hyperkalemia ?Likely from acidosis. Patient given a dose of Lokelma with initial improvement. Now elevated. ?-Recheck potassium and if still elevated, re-dose Lokelma ? ?Anxiety and depression ?-Continue home Abilify ? ?Hypothyroidism ?-Continue home Synthroid ? ?Tobacco  dependence ?Counseled on admission. Nicotine patch offered on admission. ? ?Chronic systolic CHF (congestive heart failure) (HCC) ?TTE in 12/2018 with LVEF of 25%, diffuse hypokinesis with some regional variation and indeterminate DD.  On Lasix, Coreg, losartan and spironolactone as an outpatient; diuretics held secondary to hyponatremia and AKI. Losartan held secondary to AKI. ?-Continue Coreg ?-Hold diuretics and losartan for now ? ?HTN (hypertension) ?Currently normotensive off of home medication. Coreg reduced to 3.125 mg BID on admission ?-Continue Coreg ? ?Ulcerative colitis with rectal bleeding (HCC) ?-Continue home Lialda ? ?History of GI bleed ?Per report, patient's wife reports some blood when he wipes but no bloody bowel movements. Hemoglobin is stable. ?-Continue Protonix ? ?Nausea and vomiting-resolved as of 01/24/2022 ?Resolved. ? ? ? ?DVT prophylaxis: SCDs ?Code Status:   Code Status: Full Code ?Family Communication: None at bedside ?Disposition Plan: Discharge home likely in 1-2 days if he does not develop alcohol withdrawal. 3-5 days if he does develop alcohol withdrawal. ? ? ?Consultants:  ?None ? ?Procedures:  ?None ? ?Antimicrobials: ?None  ? ? ?Subjective: ?Patient reports no nausea, vomiting, abdominal pain. No confusion. No issues overnight. Interested in quitting alcohol intake. Reports having a history of delirium tremens in the past. ? ?Objective: ?BP 115/73 (BP Location: Right Arm)   Pulse 71   Temp 97.9 ?F (36.6 ?C) (Oral)   Resp (!) 21   Ht 5\' 10"  (1.778 m)   Wt 104.5 kg   SpO2 100%   BMI 33.06 kg/m?  ? ?Examination: ? ?General exam: Appears calm and comfortable ?Respiratory system: Clear to auscultation. Respiratory effort normal. ?Cardiovascular system: S1 & S2 heard, RRR. No murmurs, rubs, gallops or clicks. ?Gastrointestinal system: Abdomen is nondistended, soft and nontender. No organomegaly or masses felt. Normal bowel sounds heard. ?Central nervous system: Alert and oriented.  Bilateral hand tremors ?  Musculoskeletal: No edema. No calf tenderness ?Skin: No cyanosis. No rashes ?Psychiatry: Judgement and insight appear normal. Mood & affect appropriate.  ? ? ?Data Reviewed: I have personally reviewed following labs and imaging studies ? ?CBC ?Lab Results  ?Component Value Date  ? WBC 3.6 (L) 01/24/2022  ? RBC 3.46 (L) 01/24/2022  ? HGB 11.7 (L) 01/24/2022  ? HCT 33.2 (L) 01/24/2022  ? MCV 96.0 01/24/2022  ? MCH 33.8 01/24/2022  ? PLT 205 01/24/2022  ? MCHC 35.2 01/24/2022  ? RDW 13.3 01/24/2022  ? LYMPHSABS 2.5 07/24/2020  ? MONOABS 0.6 07/24/2020  ? EOSABS 0.1 07/24/2020  ? BASOSABS 0.0 07/24/2020  ? ? ? ?Last metabolic panel ?Lab Results  ?Component Value Date  ? NA 132 (L) 01/24/2022  ? K 5.4 (H) 01/24/2022  ? CL 102 01/24/2022  ? CO2 17 (L) 01/24/2022  ? BUN 20 01/24/2022  ? CREATININE 1.82 (H) 01/24/2022  ? GLUCOSE 117 (H) 01/24/2022  ? GFRNONAA 43 (L) 01/24/2022  ? GFRAA >60 07/24/2020  ? CALCIUM 8.6 (L) 01/24/2022  ? PHOS 3.5 01/24/2022  ? PROT 6.1 (L) 01/24/2022  ? ALBUMIN 3.4 (L) 01/24/2022  ? LABGLOB 3.0 06/29/2020  ? AGRATIO 1.5 06/29/2020  ? BILITOT 0.7 01/24/2022  ? ALKPHOS 59 01/24/2022  ? AST 26 01/24/2022  ? ALT 20 01/24/2022  ? ANIONGAP 8 01/24/2022  ? ? ?GFR: ?Estimated Creatinine Clearance: 54.9 mL/min (A) (by C-G formula based on SCr of 1.82 mg/dL (H)). ? ?No results found for this or any previous visit (from the past 240 hour(s)).  ? ? ?Radiology Studies: ?DG Chest 2 View ? ?Result Date: 01/22/2022 ?CLINICAL DATA:  Chest pain. EXAM: CHEST - 2 VIEW COMPARISON:  Chest x-ray 07/23/2020. FINDINGS: The heart size and mediastinal contours are within normal limits. Both lungs are clear. The visualized skeletal structures are unremarkable. IMPRESSION: No active cardiopulmonary disease. Electronically Signed   By: Darliss Cheney M.D.   On: 01/22/2022 20:35   ? ? ? LOS: 1 day  ? ? ?Jacquelin Hawking, MD ?[facility] ?01/24/2022, 2:39 PM ? ? ?If 7PM-7AM, please contact  night-coverage [email or web address] ? ?

## 2022-01-25 DIAGNOSIS — E871 Hypo-osmolality and hyponatremia: Secondary | ICD-10-CM | POA: Diagnosis not present

## 2022-01-25 LAB — BASIC METABOLIC PANEL
Anion gap: 5 (ref 5–15)
BUN: 12 mg/dL (ref 6–20)
CO2: 20 mmol/L — ABNORMAL LOW (ref 22–32)
Calcium: 8.7 mg/dL — ABNORMAL LOW (ref 8.9–10.3)
Chloride: 107 mmol/L (ref 98–111)
Creatinine, Ser: 1.22 mg/dL (ref 0.61–1.24)
GFR, Estimated: >60 mL/min (ref >60)
Glucose, Bld: 104 mg/dL — ABNORMAL HIGH (ref 70–99)
Potassium: 5.3 mmol/L — ABNORMAL HIGH (ref 3.5–5.1)
Sodium: 132 mmol/L — ABNORMAL LOW (ref 135–145)

## 2022-01-25 MED ORDER — SODIUM ZIRCONIUM CYCLOSILICATE 5 G PO PACK
5.0000 g | PACK | Freq: Every day | ORAL | Status: DC
  Administered 2022-01-25 – 2022-01-26 (×2): 5 g via ORAL
  Filled 2022-01-25 (×3): qty 1

## 2022-01-25 MED ORDER — PRAZOSIN HCL 2 MG PO CAPS
2.0000 mg | ORAL_CAPSULE | Freq: Every day | ORAL | Status: DC
  Administered 2022-01-25 – 2022-01-26 (×2): 2 mg via ORAL
  Filled 2022-01-25 (×3): qty 1

## 2022-01-25 NOTE — Social Work (Signed)
?  CSW met with pt to address SU and psych resources. CSW verified pt's insurance of [place]. Pt was aware that he will probably need to go to the [place] for a referral. Pt explained that he had already spoke to the [place] and his plan is to go there once he is released. CSW discussed the impact of drinking is having on pt's health. Pt was aware of the impacted and stated that he wants to participate in inpt rehab. Pt stated that he had a plan and he will follow up once he is discharged. CSW highlighted the importance for pt to follow up in regards to treatment. Pt accepted resources that CSW provided. ?

## 2022-01-25 NOTE — Progress Notes (Signed)
? ?PROGRESS NOTE ? ? ? ?Jacob Adams  WGN:[id number] DOB: Dec 06, 1965 DOA: 01/22/2022 ?PCP: Julieanne Manson, MD ? ? ?Brief Narrative: ?56 year old M with history of EtOH abuse, fatty liver, systolic CHF/cardiomyopathy, ulcerative colitis, GIB, HTN, depression, BPD, hypothyroidism and sleep apnea presenting with "not feeling well" and found to have evidence of hyponatremia. ? ? ? ?Assessment and Plan: ?* Hyponatremia ?Likely beer potomania and possible dehydration.  He is also on losartan and Aldactone. Improving. ?-Continue to hold home losartan and Aldactone ?-Continue BMP daily ? ?Acute metabolic encephalopathy ?Possibly from hyponatremia, alcohol and/or Ativan. VBG obtained with low pCO2. Appears to have resolved. ? ?Alcohol use disorder, severe, dependence (HCC) ?Reportedly drinks about 4 quarts a day.  Last drink day before admission. Patient reports interest in quitting as he has tried in the past. History of DTs. Currently with some withdrawal signs. ?-Librium with taper ?-Continue CIWA ?-Continue thiamine, folic acid and multivitamin ?-TOC consult for counseling and resources ? ?Nausea, vomiting and epigastric pain ?Possible gastritis from alcohol abuse.  Serial troponin and EKG reassuring. Appears to be resolved. ?-Continue Protonix and Zofran ? ?Fatty liver ?Likely secondary to alcohol use. No ascites on prior imaging. ? ?Hyperkalemia ?Likely from acidosis. Patient given a dose of Lokelma with initial improvement. Now elevated. ?-re-dose Lokelma daily ? ?Anxiety and depression ?-Continue home Abilify ? ?Hypothyroidism ?-Continue home Synthroid ? ?Tobacco dependence ?Counseled on admission. Nicotine patch offered on admission. ? ?Chronic systolic CHF (congestive heart failure) (HCC) ?TTE in 12/2018 with LVEF of 25%, diffuse hypokinesis with some regional variation and indeterminate DD.  On Lasix, Coreg, losartan and spironolactone as an outpatient; diuretics held secondary to hyponatremia and AKI. Losartan  held secondary to AKI. ?-Continue Coreg ?-Hold diuretics and losartan for now ? ?HTN (hypertension) ?Currently normotensive off of home medication. Coreg reduced to 3.125 mg BID on admission ?-Continue Coreg with holding parameters ? ?Ulcerative colitis with rectal bleeding (HCC) ?-Continue home Lialda ? ?History of GI bleed ?Per report, patient's wife reports some blood when he wipes but no bloody bowel movements. Hemoglobin is stable. ?-Continue Protonix ? ?Nausea and vomiting-resolved as of 01/24/2022 ?Resolved. ? ?PTSD with insomnia ?-change prazosin to QHS if tolerates ? ? ?DVT prophylaxis: SCDs ?Code Status:   Code Status: Full Code ?Family Communication: None at bedside ?Disposition Plan: Discharge home likely in 1-2 days if he does not develop alcohol withdrawal. 3-5 days if he does develop alcohol withdrawal. ? ? ?Consultants:  ?None ? ? ? ? ?Subjective: ?C/o PTSD and not being able to sleep-- says he can not take trazadone ? ?Objective: ?BP 95/68 (BP Location: Right Arm)   Pulse 75   Temp 97.9 ?F (36.6 ?C) (Oral)   Resp 14   Ht 5\' 10"  (1.778 m)   Wt 102.7 kg   SpO2 95%   BMI 32.49 kg/m?  ? ?Examination: ? ? ?General: Appearance:    Obese male in no acute distress  ?   ?Lungs:     respirations unlabored  ?Heart:    Normal heart rate.  ?  ?MS:   All extremities are intact.  ?  ?Neurologic:   Awake, alert  ?  ? ? ?Data Reviewed: I have personally reviewed following labs and imaging studies ? ?CBC ?Lab Results  ?Component Value Date  ? WBC 3.6 (L) 01/24/2022  ? RBC 3.46 (L) 01/24/2022  ? HGB 11.7 (L) 01/24/2022  ? HCT 33.2 (L) 01/24/2022  ? MCV 96.0 01/24/2022  ? MCH 33.8 01/24/2022  ? PLT 205  01/24/2022  ? MCHC 35.2 01/24/2022  ? RDW 13.3 01/24/2022  ? LYMPHSABS 2.5 07/24/2020  ? MONOABS 0.6 07/24/2020  ? EOSABS 0.1 07/24/2020  ? BASOSABS 0.0 07/24/2020  ? ? ? ?Last metabolic panel ?Lab Results  ?Component Value Date  ? NA 132 (L) 01/25/2022  ? K 5.3 (H) 01/25/2022  ? CL 107 01/25/2022  ? CO2 20 (L)  01/25/2022  ? BUN 12 01/25/2022  ? CREATININE 1.22 01/25/2022  ? GLUCOSE 104 (H) 01/25/2022  ? GFRNONAA >60 01/25/2022  ? GFRAA >60 07/24/2020  ? CALCIUM 8.7 (L) 01/25/2022  ? PHOS 3.5 01/24/2022  ? PROT 6.1 (L) 01/24/2022  ? ALBUMIN 3.4 (L) 01/24/2022  ? LABGLOB 3.0 06/29/2020  ? AGRATIO 1.5 06/29/2020  ? BILITOT 0.7 01/24/2022  ? ALKPHOS 59 01/24/2022  ? AST 26 01/24/2022  ? ALT 20 01/24/2022  ? ANIONGAP 5 01/25/2022  ? ? ?GFR: ?Estimated Creatinine Clearance: 81.2 mL/min (by C-G formula based on SCr of 1.22 mg/dL). ? ?No results found for this or any previous visit (from the past 240 hour(s)).  ? ? ?Radiology Studies: ?No results found. ? ? ? LOS: 2 days  ? ? ?Marlin Canary DO ?[facility] ?01/25/2022, 10:10 AM ? ? ?If 7PM-7AM, please contact night-coverage ?[email or web address] ? ?

## 2022-01-26 DIAGNOSIS — E871 Hypo-osmolality and hyponatremia: Secondary | ICD-10-CM | POA: Diagnosis not present

## 2022-01-26 LAB — BASIC METABOLIC PANEL
Anion gap: 5 (ref 5–15)
BUN: 6 mg/dL (ref 6–20)
CO2: 23 mmol/L (ref 22–32)
Calcium: 8.7 mg/dL — ABNORMAL LOW (ref 8.9–10.3)
Chloride: 106 mmol/L (ref 98–111)
Creatinine, Ser: 1.07 mg/dL (ref 0.61–1.24)
GFR, Estimated: >60 mL/min (ref >60)
Glucose, Bld: 117 mg/dL — ABNORMAL HIGH (ref 70–99)
Potassium: 4.6 mmol/L (ref 3.5–5.1)
Sodium: 134 mmol/L — ABNORMAL LOW (ref 135–145)

## 2022-01-26 LAB — CBC
HCT: 31.8 % — ABNORMAL LOW (ref 39.0–52.0)
Hemoglobin: 10.5 g/dL — ABNORMAL LOW (ref 13.0–17.0)
MCH: 32.9 pg (ref 26.0–34.0)
MCHC: 33 g/dL (ref 30.0–36.0)
MCV: 99.7 fL (ref 80.0–100.0)
Platelets: 201 10*3/uL (ref 150–400)
RBC: 3.19 MIL/uL — ABNORMAL LOW (ref 4.22–5.81)
RDW: 13.8 % (ref 11.5–15.5)
WBC: 3.8 10*3/uL — ABNORMAL LOW (ref 4.0–10.5)
nRBC: 0 % (ref 0.0–0.2)

## 2022-01-26 MED ORDER — PNEUMOCOCCAL VAC POLYVALENT 25 MCG/0.5ML IJ INJ
0.5000 mL | INJECTION | INTRAMUSCULAR | Status: DC
  Filled 2022-01-26: qty 0.5

## 2022-01-26 MED ORDER — LIDOCAINE 5 % EX PTCH
1.0000 | MEDICATED_PATCH | CUTANEOUS | Status: DC
Start: 2022-01-26 — End: 2022-01-27
  Administered 2022-01-26: 1 via TRANSDERMAL
  Filled 2022-01-26 (×2): qty 1

## 2022-01-26 NOTE — Progress Notes (Signed)
? ?PROGRESS NOTE ? ? ? ?Jacob Adams  ZOX:[id number] DOB: 1966-10-15 DOA: 01/22/2022 ?PCP: Julieanne Manson, MD ? ? ?Brief Narrative: ?56 year old M with history of EtOH abuse, fatty liver, systolic CHF/cardiomyopathy, ulcerative colitis, GIB, HTN, depression, BPD, hypothyroidism and sleep apnea presenting with "not feeling well" and found to have evidence of hyponatremia. ? ? ? ?Assessment and Plan: ?Hyponatremia ?Likely beer potomania and possible dehydration.  He is also on losartan and Aldactone. Improving. ?-Continue to hold home losartan and Aldactone ?-Continue BMP daily ? ?Acute metabolic encephalopathy ?Possibly from hyponatremia, alcohol and/or Ativan. VBG obtained with low pCO2. Appears to have resolved. ? ?Alcohol use disorder, severe, dependence (HCC) ?Reportedly drinks about 4 quarts a day.  Last drink day before admission. Patient reports interest in quitting as he has tried in the past. History of DTs. Currently with some withdrawal signs. ?-Librium with taper ?-Continue CIWA ?-Continue thiamine, folic acid and multivitamin ?-TOC consult for counseling and resources ?-slight tremors this AM ? ?Nausea, vomiting and epigastric pain ?Possible gastritis from alcohol abuse.  Serial troponin and EKG reassuring. Appears to be resolved. ?-Continue Protonix and Zofran ? ?Fatty liver ?Likely secondary to alcohol use. No ascites on prior imaging. ? ?Hyperkalemia ?Likely from acidosis. Patient given a dose of Lokelma with initial improvement. Now elevated. ?-re-dose Lokelma daily ? ?Anxiety and depression ?-Continue home Abilify ? ?Hypothyroidism ?-Continue home Synthroid ? ?Tobacco dependence ?Counseled on admission. Nicotine patch offered on admission. ? ?Chronic systolic CHF (congestive heart failure) (HCC) ?TTE in 12/2018 with LVEF of 25%, diffuse hypokinesis with some regional variation and indeterminate DD.  On Lasix, Coreg, losartan and spironolactone as an outpatient; diuretics held secondary to  hyponatremia and AKI. Losartan held secondary to AKI. ?-Continue Coreg ?-Hold diuretics and losartan for now ? ?HTN (hypertension) ?Currently normotensive off of home medication. Coreg reduced to 3.125 mg BID on admission ?-Continue Coreg with holding parameters ? ?Ulcerative colitis with rectal bleeding (HCC) ?-Continue home Lialda ? ?History of GI bleed ?Per report, patient's wife reports some blood when he wipes but no bloody bowel movements. Hemoglobin is stable. ?-Continue Protonix ? ?Nausea and vomiting-resolved as of 01/24/2022 ?Resolved. ? ?PTSD with insomnia ?-change prazosin to QHS if tolerates ? ? ?DVT prophylaxis: SCDs ?Code Status:   Code Status: Full Code ?Family Communication: None at bedside ?Disposition Plan: Discharge home likely in 1-2 days if he does not develop alcohol withdrawal. 3-5 days if he does develop alcohol withdrawal. ? ? ?Consultants:  ?None ? ? ? ? ?Subjective: ?Slept well last PM ? ?Objective: ?BP 121/75 (BP Location: Right Arm)   Pulse 65   Temp 98 ?F (36.7 ?C) (Oral)   Resp 18   Ht 5\' 10"  (1.778 m)   Wt 103.7 kg   SpO2 100%   BMI 32.82 kg/m?  ? ?Examination: ? ? ? ?General: Appearance:    Obese male in no acute distress  ?   ?Lungs:     respirations unlabored  ?Heart:    Normal heart rate. Normal rhythm. No murmurs, rubs, or gallops.  ?  ?MS:   All extremities are intact.  ?  ?Neurologic:   Awake, alert, oriented x 3. Mild tremor  ?  ?  ? ? ?Data Reviewed: I have personally reviewed following labs and imaging studies ? ?CBC ?Lab Results  ?Component Value Date  ? WBC 3.8 (L) 01/26/2022  ? RBC 3.19 (L) 01/26/2022  ? HGB 10.5 (L) 01/26/2022  ? HCT 31.8 (L) 01/26/2022  ? MCV 99.7 01/26/2022  ?  MCH 32.9 01/26/2022  ? PLT 201 01/26/2022  ? MCHC 33.0 01/26/2022  ? RDW 13.8 01/26/2022  ? LYMPHSABS 2.5 07/24/2020  ? MONOABS 0.6 07/24/2020  ? EOSABS 0.1 07/24/2020  ? BASOSABS 0.0 07/24/2020  ? ? ? ?Last metabolic panel ?Lab Results  ?Component Value Date  ? NA 134 (L) 01/26/2022  ? K  4.6 01/26/2022  ? CL 106 01/26/2022  ? CO2 23 01/26/2022  ? BUN 6 01/26/2022  ? CREATININE 1.07 01/26/2022  ? GLUCOSE 117 (H) 01/26/2022  ? GFRNONAA >60 01/26/2022  ? GFRAA >60 07/24/2020  ? CALCIUM 8.7 (L) 01/26/2022  ? PHOS 3.5 01/24/2022  ? PROT 6.1 (L) 01/24/2022  ? ALBUMIN 3.4 (L) 01/24/2022  ? LABGLOB 3.0 06/29/2020  ? AGRATIO 1.5 06/29/2020  ? BILITOT 0.7 01/24/2022  ? ALKPHOS 59 01/24/2022  ? AST 26 01/24/2022  ? ALT 20 01/24/2022  ? ANIONGAP 5 01/26/2022  ? ? ?GFR: ?Estimated Creatinine Clearance: 93 mL/min (by C-G formula based on SCr of 1.07 mg/dL). ? ?No results found for this or any previous visit (from the past 240 hour(s)).  ? ? ?Radiology Studies: ?No results found. ? ? ? LOS: 3 days  ? ? ?Marlin Canary DO ?[facility] ?01/26/2022, 1:09 PM ? ? ?If 7PM-7AM, please contact night-coverage ?[email or web address] ? ?

## 2022-01-26 NOTE — Plan of Care (Signed)

## 2022-01-27 ENCOUNTER — Other Ambulatory Visit (HOSPITAL_COMMUNITY): Payer: Self-pay

## 2022-01-27 DIAGNOSIS — E871 Hypo-osmolality and hyponatremia: Secondary | ICD-10-CM | POA: Diagnosis not present

## 2022-01-27 LAB — BASIC METABOLIC PANEL
Anion gap: 7 (ref 5–15)
BUN: 7 mg/dL (ref 6–20)
CO2: 23 mmol/L (ref 22–32)
Calcium: 9.1 mg/dL (ref 8.9–10.3)
Chloride: 105 mmol/L (ref 98–111)
Creatinine, Ser: 1.04 mg/dL (ref 0.61–1.24)
GFR, Estimated: >60 mL/min (ref >60)
Glucose, Bld: 105 mg/dL — ABNORMAL HIGH (ref 70–99)
Potassium: 4.6 mmol/L (ref 3.5–5.1)
Sodium: 135 mmol/L (ref 135–145)

## 2022-01-27 MED ORDER — PNEUMOCOCCAL 20-VAL CONJ VACC 0.5 ML IM SUSY
0.5000 mL | PREFILLED_SYRINGE | INTRAMUSCULAR | Status: AC
  Administered 2022-01-27: 0.5 mL via INTRAMUSCULAR
  Filled 2022-01-27: qty 0.5

## 2022-01-27 MED ORDER — PNEUMOCOCCAL 20-VAL CONJ VACC 0.5 ML IM SUSY: 0.5000 mL | PREFILLED_SYRINGE | INTRAMUSCULAR | Status: DC

## 2022-01-27 MED ORDER — CARVEDILOL 3.125 MG PO TABS
3.1250 mg | ORAL_TABLET | Freq: Two times a day (BID) | ORAL | 0 refills | Status: DC
  Filled 2022-01-27: qty 60, 30d supply, fill #0

## 2022-01-27 MED ORDER — PRAZOSIN HCL 2 MG PO CAPS: 2.0000 mg | ORAL_CAPSULE | Freq: Every day | ORAL | Status: DC

## 2022-01-27 NOTE — Discharge Summary (Signed)
?Physician Discharge Summary ?  ?Patient: Jacob Adams MRN: [id number] DOB: [birth year]  ?Admit date:     01/22/2022  ?Discharge date: 01/27/22  ?Discharge Physician: Joseph Art  ? ?PCP: Julieanne Manson, MD  ? ?Recommendations at discharge:  ? ?Alcohol cessation ?BP medications adjusted to inpt BP-- may need to re-adjust pending his intake outpatient  ?BMP at next office visit ? ?Discharge Diagnoses: ?Principal Problem: ?  Hyponatremia ?Active Problems: ?  Alcohol use disorder, severe, dependence (HCC) ?  Acute metabolic encephalopathy ?  Nausea, vomiting and epigastric pain ?  History of GI bleed ?  Ulcerative colitis with rectal bleeding (HCC) ?  HTN (hypertension) ?  Chronic systolic CHF (congestive heart failure) (HCC) ?  Tobacco dependence ?  Hypothyroidism ?  Anxiety and depression ?  Hyperkalemia ?  Fatty liver ? ?Resolved Problems: ?  Nausea and vomiting ? ?Hospital Course: ?56 year old M with history of EtOH abuse, fatty liver, systolic CHF/cardiomyopathy, ulcerative colitis, GIB, HTN, depression, BPD, hypothyroidism and sleep apnea presenting with "not feeling well" and found to have evidence of hyponatremia. ? ?Assessment and Plan: ?* Hyponatremia ?Likely beer potomania and possible dehydration.  He is also on losartan and Aldactone. Improving. ?-Continue to hold home losartan and Aldactone ?-outpatient follow up ? ?Acute metabolic encephalopathy ?Possibly from hyponatremia, alcohol and/or Ativan. VBG obtained with low pCO2. Appears to have resolved. ? ?Alcohol use disorder, severe, dependence (HCC) ?Reportedly drinks about 4 quarts a day.  Last drink day before admission. Patient reports interest in quitting as he has tried in the past. History of DTs.  ?-Librium with taper complete ?-Continue CIWA ?-Continue thiamine, folic acid and multivitamin ?-TOC consult for counseling and resources ? ?Nausea, vomiting and epigastric pain ?Possible gastritis from alcohol abuse.  Serial troponin and EKG  reassuring. Appears to be resolved. ?-Continue Protonix and Zofran ? ?High anion gap metabolic acidosis ?Likely from alcohol.  Lactic acid within normal.  Not diabetic.  No significant azotemia.  ?-d/c NA tabs in AM if stable ? ?Fatty liver ?Likely secondary to alcohol use. No ascites on prior imaging. ? ?Hyperkalemia ?Likely from acidosis. Patient given a dose of Lokelma with initial improvement. ? ? ?Anxiety and depression ?-Continue home Abilify ? ?Hypothyroidism ?-Continue home Synthroid ? ?Tobacco dependence ?Counseled on admission. Nicotine patch offered on admission. ? ?Chronic systolic CHF (congestive heart failure) (HCC) ?TTE in 12/2018 with LVEF of 25%, diffuse hypokinesis with some regional variation and indeterminate DD.  On Lasix, Coreg, losartan and spironolactone as an outpatient; diuretics held secondary to hyponatremia and AKI. Losartan held secondary to AKI. ?-Continue Coreg ?-Hold diuretics and losartan for now ? ?HTN (hypertension) ?Currently normotensive off of home medication. Coreg reduced to 3.125 mg BID on admission ?-Continue Coreg ? ?Ulcerative colitis with rectal bleeding (HCC) ?-Continue home Lialda ? ?History of GI bleed ?Per report, patient's wife reports some blood when he wipes but no bloody bowel movements. Hemoglobin is stable. ?-Continue Protonix ? ?Nausea and vomiting-resolved as of 01/24/2022 ?Resolved. ? ? ? ? ?  ? ? ?Disposition: Home ?Diet recommendation:  ?Discharge Diet Orders (From admission, onward)  ? ?  Start     Ordered  ? 01/27/22 0000  Diet - low sodium heart healthy       ? 01/27/22 7829  ? ?  ?  ? ?  ? ?Cardiac diet ?DISCHARGE MEDICATION: ?Allergies as of 01/27/2022   ? ?   Reactions  ? Uncoded Nonscreenable Allergen Swelling, Other (See Comments)  ? Sun tan lotion:  swelling and peeling  ? Ibuprofen Nausea Only, Swelling, Other (See Comments)  ? "Discomfort of stomach," also  ? Nsaids Swelling, Other (See Comments)  ? "Discomfort of stomach," also  ? Penicillins  Itching, Swelling, Rash  ? Has patient had a PCN reaction causing immediate rash, facial/tongue/throat swelling, SOB or lightheadedness with hypotension: yes ?Has patient had a PCN reaction causing severe rash involving mucus membranes or skin necrosis: no ?Has patient had a PCN reaction that required hospitalization yes, happened while hospitalized ?Has patient had a PCN reaction occurring within the last 10 years: yes ?If all of the above answers are "NO", then may proceed with Cephalosporin use.  ? [name] Other (See Comments)  ? DOESN'T EAT PORK- patient preference  ? ?  ? ?  ?Medication List  ?  ? ?STOP taking these medications   ? ?ferrous sulfate 325 (65 FE) MG tablet ?  ?furosemide 40 MG tablet ?Commonly known as: LASIX ?  ?losartan 50 MG tablet ?Commonly known as: COZAAR ?  ?pantoprazole 40 MG tablet ?Commonly known as: PROTONIX ?  ?Potassium Chloride ER 20 MEQ Tbcr ?  ?spironolactone 25 MG tablet ?Commonly known as: ALDACTONE ?  ? ?  ? ?TAKE these medications   ? ?ARIPiprazole 10 MG tablet ?Commonly known as: ABILIFY ?Take 5 mg by mouth daily. ?  ?atorvastatin 80 MG tablet ?Commonly known as: LIPITOR ?Take 40 mg by mouth at bedtime. ?  ?carvedilol 3.125 MG tablet ?Commonly known as: COREG ?Take 1 tablet (3.125 mg total) by mouth 2 (two) times daily with a meal. ?What changed:  ?medication strength ?how much to take ?  ?FLUoxetine 20 MG capsule ?Commonly known as: PROZAC ?Take 20 mg by mouth daily. ?  ?folic acid 1 MG tablet ?Commonly known as: FOLVITE ?Take 1 mg by mouth daily. ?  ?hydrOXYzine 10 MG tablet ?Commonly known as: ATARAX ?Take 10 mg by mouth 2 (two) times daily as needed for anxiety. ?  ?lactulose 10 GM/15ML solution ?Commonly known as: CHRONULAC ?Take 45 mLs (30 g total) by mouth 3 (three) times daily. ?  ?latanoprost 0.005 % ophthalmic solution ?Commonly known as: XALATAN ?Place 1 drop into both eyes at bedtime. ?  ?levothyroxine 88 MCG tablet ?Commonly known as: SYNTHROID ?Take  88 mcg by mouth daily before breakfast. ?  ?lidocaine 5 % ointment ?Commonly known as: XYLOCAINE ?Apply 1 application. topically 2 (two) times daily as needed (knee and joint pain). ?  ?mesalamine 1.2 g EC tablet ?Commonly known as: LIALDA ?Take 4 tablets (4.8 g total) by mouth daily. ?  ?multivitamin with minerals Tabs tablet ?Take 1 tablet by mouth daily. ?  ?prazosin 2 MG capsule ?Commonly known as: MINIPRESS ?Take 1 capsule (2 mg total) by mouth at bedtime. ?What changed:  ?medication strength ?how much to take ?when to take this ?  ?rifaximin 550 MG Tabs tablet ?Commonly known as: XIFAXAN ?Take 1 tablet (550 mg total) by mouth 2 (two) times daily. ?  ?thiamine 100 MG tablet ?Take 1 tablet (100 mg total) by mouth daily. ?  ? ?  ? ? Follow-up Information   ? ? Julieanne Manson, MD Follow up.   ?Specialty: Internal Medicine ?Why: BMP 1 week ?Contact information: ?[address] ?[place] [address] ?[phone] ? ? ?  ?  ? ? Lars Masson, MD .   ?Specialty: Cardiology ?Contact information: ?[address] ?[address] ?[place] [address] ?[phone] ? ? ?  ?  ? ?  ?  ? ?  ? ?  Discharge Exam: ?Filed Weights  ? 01/25/22 0313 01/26/22 0341 01/27/22 0229  ?Weight: 102.7 kg 103.7 kg 103.9 kg  ? ?No tremors ? ?Condition at discharge: good ? ?The results of significant diagnostics from this hospitalization (including imaging, microbiology, ancillary and laboratory) are listed below for reference.  ? ?Imaging Studies: ?DG Chest 2 View ? ?Result Date: 01/22/2022 ?CLINICAL DATA:  Chest pain. EXAM: CHEST - 2 VIEW COMPARISON:  Chest x-ray 07/23/2020. FINDINGS: The heart size and mediastinal contours are within normal limits. Both lungs are clear. The visualized skeletal structures are unremarkable. IMPRESSION: No active cardiopulmonary disease. Electronically Signed   By: Darliss Cheney M.D.   On: 01/22/2022 20:35   ? ?Microbiology: ?Results for orders placed or performed during the hospital encounter of  07/23/20  ?Respiratory Panel by RT PCR (Flu A&B, Covid) - Nasopharyngeal Swab     Status: None  ? Collection Time: 07/23/20  9:15 AM  ? Specimen: Nasopharyngeal Swab  ?Result Value Ref Range Status  ? SARS Coronavirus 2 b

## 2022-01-27 NOTE — Progress Notes (Addendum)
Discharge instructions reviewed with pt. ?Copy of instructions given to pt. 1 script delivered to pt's room by [facility] pharmacy, pt has med. Pt has notified wife and she will be coming to take him home. Pt instructed to call when wife arrives.  Discussed daily weights to  monitor his weight for his CHF. Pt states he weighs daily, education reviewed with pt when to call his doctor.   ? ?Pt d/c'd via wheelchair with belongings, wife at main entrance pt states.  ?         ?Escorted by hospital volunteer.  ?

## 2022-01-27 NOTE — TOC Transition Note (Addendum)
Transition of Care (TOC) - CM/SW Discharge Note ? ? ?Patient Details  ?Name: Jacob Adams ?MRN: [id number] ?Date of Birth: [birth year] ? ?Transition of Care (TOC) CM/SW Contact:  ?Zenon Mayo, RN ?Phone Number: ?01/27/2022, 9:40 AM ? ? ?Clinical Narrative:    ?Patient is for dc today, CSW gave patient resources over the weekend. Wife will be transporting him home today. TOC to fill meds.  ? ? ?  ?  ? ? ?Patient Goals and CMS Choice ?  ?  ?  ? ?Discharge Placement ?  ?           ?  ?  ?  ?  ? ?Discharge Plan and Services ?  ?  ?           ?  ?  ?  ?  ?  ?  ?  ?  ?  ?  ? ?Social Determinants of Health (SDOH) Interventions ?  ? ? ?Readmission Risk Interventions ?   ? View : No data to display.  ?  ?  ?  ? ? ? ? ? ?

## 2022-03-25 ENCOUNTER — Emergency Department (HOSPITAL_COMMUNITY): Payer: No Typology Code available for payment source

## 2022-03-25 ENCOUNTER — Emergency Department (HOSPITAL_COMMUNITY)
Admission: EM | Admit: 2022-03-25 | Discharge: 2022-03-25 | Disposition: A | Discharge status: Home / Self Care | Payer: No Typology Code available for payment source | Attending: Emergency Medicine | Admitting: Emergency Medicine

## 2022-03-25 DIAGNOSIS — K625 Hemorrhage of anus and rectum: Secondary | ICD-10-CM | POA: Insufficient documentation

## 2022-03-25 DIAGNOSIS — E871 Hypo-osmolality and hyponatremia: Secondary | ICD-10-CM | POA: Diagnosis not present

## 2022-03-25 DIAGNOSIS — I509 Heart failure, unspecified: Secondary | ICD-10-CM | POA: Diagnosis not present

## 2022-03-25 DIAGNOSIS — I110 Hypertensive heart disease with heart failure: Secondary | ICD-10-CM | POA: Insufficient documentation

## 2022-03-25 DIAGNOSIS — Z79899 Other long term (current) drug therapy: Secondary | ICD-10-CM | POA: Diagnosis not present

## 2022-03-25 DIAGNOSIS — F1010 Alcohol abuse, uncomplicated: Secondary | ICD-10-CM | POA: Diagnosis not present

## 2022-03-25 DIAGNOSIS — R079 Chest pain, unspecified: Secondary | ICD-10-CM | POA: Insufficient documentation

## 2022-03-25 DIAGNOSIS — R0602 Shortness of breath: Secondary | ICD-10-CM | POA: Insufficient documentation

## 2022-03-25 DIAGNOSIS — Y909 Presence of alcohol in blood, level not specified: Secondary | ICD-10-CM | POA: Insufficient documentation

## 2022-03-25 LAB — POC OCCULT BLOOD, ED: Fecal Occult Bld: NEGATIVE

## 2022-03-25 LAB — TROPONIN I (HIGH SENSITIVITY)
Troponin I (High Sensitivity): 9 ng/L (ref <18)
Troponin I (High Sensitivity): 10 ng/L (ref <18)

## 2022-03-25 LAB — COMPREHENSIVE METABOLIC PANEL
ALT: 54 U/L — ABNORMAL HIGH (ref 0–44)
AST: 119 U/L — ABNORMAL HIGH (ref 15–41)
Albumin: 3.5 g/dL (ref 3.5–5.0)
Alkaline Phosphatase: 67 U/L (ref 38–126)
Anion gap: 13 (ref 5–15)
BUN: 5 mg/dL — ABNORMAL LOW (ref 6–20)
CO2: 17 mmol/L — ABNORMAL LOW (ref 22–32)
Calcium: 8.3 mg/dL — ABNORMAL LOW (ref 8.9–10.3)
Chloride: 102 mmol/L (ref 98–111)
Creatinine, Ser: 1.32 mg/dL — ABNORMAL HIGH (ref 0.61–1.24)
GFR, Estimated: >60 mL/min (ref >60)
Glucose, Bld: 118 mg/dL — ABNORMAL HIGH (ref 70–99)
Potassium: 3.6 mmol/L (ref 3.5–5.1)
Sodium: 132 mmol/L — ABNORMAL LOW (ref 135–145)
Total Bilirubin: 1.3 mg/dL — ABNORMAL HIGH (ref 0.3–1.2)
Total Protein: 6.5 g/dL (ref 6.5–8.1)

## 2022-03-25 LAB — CBC
HCT: 33.3 % — ABNORMAL LOW (ref 39.0–52.0)
Hemoglobin: 11.3 g/dL — ABNORMAL LOW (ref 13.0–17.0)
MCH: 33.2 pg (ref 26.0–34.0)
MCHC: 33.9 g/dL (ref 30.0–36.0)
MCV: 97.9 fL (ref 80.0–100.0)
Platelets: 278 10*3/uL (ref 150–400)
RBC: 3.4 MIL/uL — ABNORMAL LOW (ref 4.22–5.81)
RDW: 13.9 % (ref 11.5–15.5)
WBC: 5.4 10*3/uL (ref 4.0–10.5)
nRBC: 0 % (ref 0.0–0.2)

## 2022-03-25 LAB — ETHANOL: Alcohol, Ethyl (B): <10 mg/dL (ref <10)

## 2022-03-25 LAB — LIPASE, BLOOD: Lipase: 31 U/L (ref 11–51)

## 2022-03-25 LAB — BRAIN NATRIURETIC PEPTIDE: B Natriuretic Peptide: 28.1 pg/mL (ref 0.0–100.0)

## 2022-03-25 LAB — MAGNESIUM: Magnesium: 1.6 mg/dL — ABNORMAL LOW (ref 1.7–2.4)

## 2022-03-25 MED ORDER — FOLIC ACID 1 MG PO TABS
1.0000 mg | ORAL_TABLET | Freq: Every day | ORAL | Status: DC
  Administered 2022-03-25: 1 mg via ORAL
  Filled 2022-03-25: qty 1

## 2022-03-25 MED ORDER — LORAZEPAM 2 MG/ML IJ SOLN
1.0000 mg | INTRAMUSCULAR | Status: DC | PRN
  Administered 2022-03-25 (×2): 2 mg via INTRAVENOUS
  Filled 2022-03-25 (×2): qty 1

## 2022-03-25 MED ORDER — CHLORDIAZEPOXIDE HCL 25 MG PO CAPS: ORAL_CAPSULE | ORAL | 0 refills | Status: DC

## 2022-03-25 MED ORDER — ADULT MULTIVITAMIN W/MINERALS CH
1.0000 | ORAL_TABLET | Freq: Every day | ORAL | Status: DC
  Administered 2022-03-25: 1 via ORAL
  Filled 2022-03-25: qty 1

## 2022-03-25 MED ORDER — MORPHINE SULFATE (PF) 4 MG/ML IV SOLN
4.0000 mg | Freq: Once | INTRAVENOUS | Status: AC
  Administered 2022-03-25: 4 mg via INTRAVENOUS
  Filled 2022-03-25: qty 1

## 2022-03-25 MED ORDER — ONDANSETRON HCL 4 MG PO TABS: 4.0000 mg | ORAL_TABLET | Freq: Four times a day (QID) | ORAL | 0 refills | Status: DC

## 2022-03-25 MED ORDER — THIAMINE HCL 100 MG PO TABS
100.0000 mg | ORAL_TABLET | Freq: Every day | ORAL | Status: DC
  Filled 2022-03-25: qty 1

## 2022-03-25 MED ORDER — LORAZEPAM 1 MG PO TABS: 1.0000 mg | ORAL_TABLET | ORAL | Status: DC | PRN

## 2022-03-25 MED ORDER — MAGNESIUM SULFATE 2 GM/50ML IV SOLN
2.0000 g | Freq: Once | INTRAVENOUS | Status: AC
  Administered 2022-03-25: 2 g via INTRAVENOUS
  Filled 2022-03-25: qty 50

## 2022-03-25 MED ORDER — THIAMINE HCL 100 MG/ML IJ SOLN
100.0000 mg | Freq: Every day | INTRAMUSCULAR | Status: DC
  Administered 2022-03-25: 100 mg via INTRAVENOUS
  Filled 2022-03-25: qty 2

## 2022-03-25 NOTE — ED Notes (Signed)
Chaperoned rectal exam with PA, labeled and walked occult card to lab

## 2022-03-25 NOTE — ED Notes (Signed)
Holding oral meds at this time to give the administered ativan time to relieve pts nausea

## 2022-03-25 NOTE — ED Provider Notes (Addendum)
[facility] EMERGENCY DEPARTMENT Provider Note   CSN: [id number] Arrival date & time: 03/25/22  1715     History  Chief Complaint  Patient presents with   Chest Pain   Shortness of Breath   Rectal Bleeding    Jacob Adams is a 56 y.o. male with past medical history significant for severe alcohol abuse, reporting that he drinks 5 to 6 40 ounce beers a day, last drink was yesterday, with history of hypertension, congestive heart failure, tobacco dependence, hyperlipidemia who presents with concern for chest pain, shortness of breath, nausea, vomiting, diarrhea for the last 3 days.  Patient reports the chest pain is central to left-sided, has been associated with nausea, vomiting.  Patient reports no improvement with aspirin or nitroglycerin in route.  He denies any previous history of ACS.  When asked if this feels similar to previous withdrawal symptoms patient reported "I do not know".  Patient had also endorsed that he has been having a lot of bright red blood per rectum and stool for the last week.  He denies bright red blood between bowel movements.  He does not take any blood thinners.   Chest Pain Associated symptoms: shortness of breath   Shortness of Breath Associated symptoms: chest pain   Rectal Bleeding     Home Medications Prior to Admission medications   Medication Sig Start Date End Date Taking? Authorizing Provider  ARIPiprazole (ABILIFY) 10 MG tablet Take 5 mg by mouth daily.   Yes [provider]  atorvastatin (LIPITOR) 80 MG tablet Take 40 mg by mouth at bedtime.    Yes [provider]  carvedilol (COREG) 3.125 MG tablet Take 1 tablet (3.125 mg total) by mouth 2 (two) times daily with a meal. 01/27/22  Yes Vann, Jessica U, DO  chlordiazePOXIDE (LIBRIUM) 25 MG capsule 75m PO TID x 1D, then 25-566mPO BID X 1D, then 25-5081mO QD X 1D 03/25/22  Yes Zacheriah Stumpe H, PA-C  FLUoxetine (PROZAC) 20 MG capsule Take 20 mg by mouth  daily.   Yes [provider]  folic acid (FOLVITE) 1 MG tablet Take 1 mg by mouth daily.   Yes [provider]  hydrOXYzine (ATARAX) 10 MG tablet Take 10 mg by mouth in the morning and at bedtime.   Yes [provider]  lactulose (CHRONULAC) 10 GM/15ML solution Take 45 mLs (30 g total) by mouth 3 (three) times daily. 07/24/20  Yes Adhikari, AmrTamsen MeekD  latanoprost (XALATAN) 0.005 % ophthalmic solution Place 1 drop into both eyes at bedtime.   Yes [provider]  levothyroxine (SYNTHROID) 88 MCG tablet Take 88 mcg by mouth daily before breakfast.   Yes [provider]  lidocaine (XYLOCAINE) 5 % ointment Apply 1 application. topically 2 (two) times daily as needed (knee and joint pain).   Yes [provider]  Multiple Vitamin (MULTIVITAMIN WITH MINERALS) TABS tablet Take 1 tablet by mouth daily.   Yes [provider]  ondansetron (ZOFRAN) 4 MG tablet Take 1 tablet (4 mg total) by mouth every 6 (six) hours. 03/25/22  Yes Giancarlos Berendt H, PA-C  prazosin (MINIPRESS) 2 MG capsule Take 1 capsule (2 mg total) by mouth at bedtime. 01/27/22  Yes VanGeradine GirtO  rifaximin (XIFAXAN) 550 MG TABS tablet Take 1 tablet (550 mg total) by mouth 2 (two) times daily. Patient taking differently: Take 550 mg by mouth every morning. 07/24/20  Yes AdhShelly CossD  mesalamine (LIALDA) 1.2 g  EC tablet Take 4 tablets (4.8 g total) by mouth daily. 07/25/20 01/23/22  Shelly Coss, MD  thiamine 100 MG tablet Take 1 tablet (100 mg total) by mouth daily. Patient not taking: Reported on 03/25/2022 07/24/20   Shelly Coss, MD      Allergies    Uncoded nonscreenable allergen, Ibuprofen, Nsaids, Penicillins, and [name]    Review of Systems   Review of Systems  Respiratory:  Positive for shortness of breath.   Cardiovascular:  Positive for chest pain.  Gastrointestinal:  Positive for hematochezia.  All other systems reviewed and are  negative.  Physical Exam Updated Vital Signs BP (!) 139/95   Pulse 84   Temp 98.7 F (37.1 C)   Resp 17   SpO2 99%  Physical Exam Vitals and nursing note reviewed.  Constitutional:      General: He is not in acute distress.    Appearance: Normal appearance. He is diaphoretic.  HENT:     Head: Normocephalic and atraumatic.  Eyes:     General:        Right eye: No discharge.        Left eye: No discharge.  Cardiovascular:     Rate and Rhythm: Normal rate and regular rhythm.     Heart sounds: No murmur heard.   No friction rub. No gallop.  Pulmonary:     Effort: Pulmonary effort is normal.     Breath sounds: Normal breath sounds.  Chest:     Comments: Some tenderness palpation in the central chest, extending down into the upper abdomen Abdominal:     General: Bowel sounds are normal.     Palpations: Abdomen is soft.     Comments: Tenderness to palpation of the epigastric region, no rebound, rigidity, guarding.  Skin:    General: Skin is warm.     Capillary Refill: Capillary refill takes less than 2 seconds.  Neurological:     Mental Status: He is alert and oriented to person, place, and time.  Psychiatric:        Mood and Affect: Mood normal.        Behavior: Behavior normal.    ED Results / Procedures / Treatments   Labs (all labs ordered are listed, but only abnormal results are displayed) Labs Reviewed  CBC - Abnormal; Notable for the following components:      Result Value   RBC 3.40 (*)    Hemoglobin 11.3 (*)    HCT 33.3 (*)    All other components within normal limits  COMPREHENSIVE METABOLIC PANEL - Abnormal; Notable for the following components:   Sodium 132 (*)    CO2 17 (*)    Glucose, Bld 118 (*)    BUN 5 (*)    Creatinine, Ser 1.32 (*)    Calcium 8.3 (*)    AST 119 (*)    ALT 54 (*)    Total Bilirubin 1.3 (*)    All other components within normal limits  MAGNESIUM - Abnormal; Notable for the following components:   Magnesium 1.6 (*)    All  other components within normal limits  LIPASE, BLOOD  ETHANOL  BRAIN NATRIURETIC PEPTIDE  POC OCCULT BLOOD, ED  TROPONIN I (HIGH SENSITIVITY)  TROPONIN I (HIGH SENSITIVITY)    EKG EKG Interpretation  Date/Time:  Tuesday March 25 2022 17:24:55 EDT Ventricular Rate:  87 PR Interval:    QRS Duration: 120 QT Interval:  379 QTC Calculation: 456 R Axis:   -59 Text  Interpretation: Sinus rhythm Nonspecific IVCD with LAD Artifact Confirmed by Fredia Sorrow [phone]) on 03/25/2022 5:58:01 PM  Radiology DG Chest 2 View  Result Date: 03/25/2022 CLINICAL DATA:  Chest pain short of breath EXAM: CHEST - 2 VIEW COMPARISON:  01/22/2022 FINDINGS: The heart size and mediastinal contours are within normal limits. Both lungs are clear. Minimal scar or atelectasis left base. The visualized skeletal structures are unremarkable. IMPRESSION: No active cardiopulmonary disease. Electronically Signed   By: Donavan Foil M.D.   On: 03/25/2022 17:55    Procedures Procedures    Medications Ordered in ED Medications  LORazepam (ATIVAN) tablet 1-4 mg ( Oral See Alternative 03/25/22 2228)    Or  LORazepam (ATIVAN) injection 1-4 mg (2 mg Intravenous Given 03/25/22 2228)  thiamine tablet 100 mg ( Oral See Alternative 03/25/22 1751)    Or  thiamine (B-1) injection 100 mg (100 mg Intravenous Given 01/22/91 0100)  folic acid (FOLVITE) tablet 1 mg (1 mg Oral Given 03/25/22 1915)  multivitamin with minerals tablet 1 tablet (1 tablet Oral Given 03/25/22 1915)  morphine (PF) 4 MG/ML injection 4 mg (4 mg Intravenous Given 03/25/22 1751)  magnesium sulfate IVPB 2 g 50 mL (0 g Intravenous Stopped 03/25/22 2232)    ED Course/ Medical Decision Making/ A&P                           Medical Decision Making Amount and/or Complexity of Data Reviewed Labs: ordered. Radiology: ordered.  Risk OTC drugs. Prescription drug management.   This patient is a 56 y.o. male who presents to the ED for concern of chest pain, shortness of  breath, diaphoresis, potential alcohol withdrawal, as well as hematochezia, this involves an extensive number of treatment options, and is a complaint that carries with it a high risk of complications and morbidity. The emergent differential diagnosis prior to evaluation includes, but is not limited to, ACS, AAS, PE, pneumonia, pancreatitis, acute mesenteric ischemia, acute alcohol withdrawal, other GI bleed, Boerhaave's, Mallory-Weiss versus other.   This is not an exhaustive differential.   Past Medical History / Co-morbidities / Social History: Severe alcohol use disorder, hypertension, CHF, tobacco use, no previous history of ACS  Additional history: Chart reviewed. Pertinent results include: Reviewed lab work, imaging from previous emergency department visits  Physical Exam: Physical exam performed. The pertinent findings include: Patient with some tenderness palpation in the epigastric region, I am suspicious that his chest pain is actually upper abdominal pain.  He is in no acute respiratory distress, he is somewhat diaphoretic on arrival, I have clinical concern that he may be acutely withdrawing.  Lab Tests: I ordered, and personally interpreted labs.  The pertinent results include: Patient with mild hyponatremia, sodium 132, he has history of similar, favor beer [place] mania.  He has creatinine of 1.32 which is mildly elevated but not significantly from baseline.  He has elevation of his liver enzymes which is consistent with severe alcohol abuse.  His magnesium is slightly decreased at 1.6 we will replete at this time.  He shows no signs of QT prolongation on his EKG.  His troponin is negative x2 in context of story that does not seem consistent with cardiac chest pain.  Shows no signs of heart failure exacerbation.  He had endorsed some hematochezia but his Hemoccult is negative at this time.  His complaints of significant hematochezia since hemoglobin is improved from his recent previous lab  work, is difficult what to  make of patient's complaint but I do not have clinical signs of an active GI bleed at this time.  He shows no elevated white count or leukocytosis.   Imaging Studies: I ordered imaging studies including plain film chest x-ray. I independently visualized and interpreted imaging which showed no evidence of intrathoracic abnormality. I agree with the radiologist interpretation.   Cardiac Monitoring:  The patient was maintained on a cardiac monitor.  My attending physician Dr. Rogene Houston viewed and interpreted the cardiac monitored which showed an underlying rhythm of: Normal sinus rhythm. I agree with this interpretation.   Medications: I ordered medication including morphine for pain, magnesium for hypomagnesemia, folate, multivitamin, thiamine for severe alcohol abuse, Ativan x1 for CIWA elevated at 7 on arrival. Reevaluation of the patient after these medicines showed that the patient improved. I have reviewed the patients home medicines and have made adjustments as needed.    Disposition: After consideration of the diagnostic results and the patients response to treatment, I feel that patient clinical picture is consistent with epigastric abdominal pain radiating to the chest likely from alcoholic gastritis, I see no signs of Mallory-Weiss or Boerhaave's this patient has not had any hematemesis or persistently severe epigastric or esophageal pain, he shows no signs of acute ACS at this time, discussed with patient that he is not currently in delirium tremens or showing signs of severe withdrawal, he is having no hallucinations no tachycardia no fever.  He clinically expresses desire to stop drinking, discussed I recommend the Librium taper for the patient does not have any severe withdrawal symptoms or seizures.  Patient agrees to this plan, will discharge with Zofran for nausea.  Discussed extensive return precautions for worsening chest pain, shortness of breath, fever,  chills, or signs of uncontrolled alcohol withdrawal.  I discussed this case with my attending physician Dr. Rogene Houston who cosigned this note including patient's presenting symptoms, physical exam, and planned diagnostics and interventions. Attending physician stated agreement with plan or made changes to plan which were implemented.    Addendum: Patient with report that he is going to detox at the [facility] tomorrow morning.  His wife says that he is unable to pick up prescriptions for Librium, Zofran from [facility] due to insurance and difficulty with transportation.  She requests that he be able to stay overnight, consult with social work regarding these concerns.  Although patient appears stable for discharge, continues to show no signs of delirium tremens or other abnormalities to keep him in the emergency department he will stay overnight as a border to await social work consult and be discharged in the morning.  Final Clinical Impression(s) / ED Diagnoses Final diagnoses:  Chest pain, unspecified type  Alcohol abuse    Rx / DC Orders ED Discharge Orders          Ordered    chlordiazePOXIDE (LIBRIUM) 25 MG capsule        03/25/22 2220    ondansetron (ZOFRAN) 4 MG tablet  Every 6 hours        03/25/22 2220              Tarryn Bogdan, Ollie H, PA-C 03/25/22 2226    Anselmo Pickler, PA-C 03/25/22 2350    Fredia Sorrow, MD 03/31/22 1100

## 2022-03-25 NOTE — Discharge Instructions (Signed)
You can take the new medication I am prescribing to help with withdrawal symptoms.  I recommend that you follow-up with outpatient substance abuse resources for rehab and support while trying to stop drinking.  I have prescribed you some nausea medication as well.  I recommend that you return to the emergency department if you are having new chest pain, shortness of breath, severe fever, chills, seizure-like activity or other concerns relating to stopping drinking.

## 2022-03-25 NOTE — ED Triage Notes (Signed)
Pt also mentioned that he has been having a lot of bright red blood in his stool for the past week

## 2022-03-25 NOTE — ED Triage Notes (Signed)
Pt arrives via EMS from home c/o chest pain, shortness of breath, N/V/D x3 days. Pt normally drinks five to six 40oz beers a day, last drink was yesterday.  Pt received 83m zofran, 324 ASA, and 0.46mnitro x2 en route

## 2022-03-25 NOTE — ED Notes (Signed)
PT off unit to xray

## 2022-04-01 ENCOUNTER — Emergency Department (HOSPITAL_COMMUNITY)
Admission: EM | Admit: 2022-04-01 | Discharge: 2022-04-02 | Disposition: A | Discharge status: Other Acute Inpatient Hospital | Payer: No Typology Code available for payment source | Attending: Emergency Medicine | Admitting: Emergency Medicine

## 2022-04-01 ENCOUNTER — Encounter (HOSPITAL_COMMUNITY): Payer: Self-pay

## 2022-04-01 DIAGNOSIS — E876 Hypokalemia: Secondary | ICD-10-CM | POA: Diagnosis not present

## 2022-04-01 DIAGNOSIS — I10 Essential (primary) hypertension: Secondary | ICD-10-CM | POA: Diagnosis not present

## 2022-04-01 DIAGNOSIS — Y900 Blood alcohol level of less than 20 mg/100 ml: Secondary | ICD-10-CM | POA: Diagnosis not present

## 2022-04-01 DIAGNOSIS — R45851 Suicidal ideations: Secondary | ICD-10-CM | POA: Insufficient documentation

## 2022-04-01 DIAGNOSIS — F10129 Alcohol abuse with intoxication, unspecified: Secondary | ICD-10-CM | POA: Diagnosis not present

## 2022-04-01 DIAGNOSIS — Z79899 Other long term (current) drug therapy: Secondary | ICD-10-CM | POA: Insufficient documentation

## 2022-04-01 DIAGNOSIS — F1020 Alcohol dependence, uncomplicated: Secondary | ICD-10-CM | POA: Diagnosis present

## 2022-04-01 DIAGNOSIS — F333 Major depressive disorder, recurrent, severe with psychotic symptoms: Secondary | ICD-10-CM | POA: Diagnosis present

## 2022-04-01 DIAGNOSIS — Z20822 Contact with and (suspected) exposure to covid-19: Secondary | ICD-10-CM | POA: Insufficient documentation

## 2022-04-01 DIAGNOSIS — F32A Depression, unspecified: Secondary | ICD-10-CM

## 2022-04-01 LAB — RESP PANEL BY RT-PCR (FLU A&B, COVID) ARPGX2
Influenza A by PCR: NEGATIVE
Influenza B by PCR: NEGATIVE
SARS Coronavirus 2 by RT PCR: NEGATIVE

## 2022-04-01 LAB — CBC WITH DIFFERENTIAL/PLATELET
Abs Immature Granulocytes: 0.01 10*3/uL (ref 0.00–0.07)
Basophils Absolute: 0 10*3/uL (ref 0.0–0.1)
Basophils Relative: 1 %
Eosinophils Absolute: 0.1 10*3/uL (ref 0.0–0.5)
Eosinophils Relative: 2 %
HCT: 32.1 % — ABNORMAL LOW (ref 39.0–52.0)
Hemoglobin: 11.1 g/dL — ABNORMAL LOW (ref 13.0–17.0)
Immature Granulocytes: 0 %
Lymphocytes Relative: 23 %
Lymphs Abs: 1 10*3/uL (ref 0.7–4.0)
MCH: 33.7 pg (ref 26.0–34.0)
MCHC: 34.6 g/dL (ref 30.0–36.0)
MCV: 97.6 fL (ref 80.0–100.0)
Monocytes Absolute: 0.7 10*3/uL (ref 0.1–1.0)
Monocytes Relative: 15 %
Neutro Abs: 2.7 10*3/uL (ref 1.7–7.7)
Neutrophils Relative %: 59 %
Platelets: 202 10*3/uL (ref 150–400)
RBC: 3.29 MIL/uL — ABNORMAL LOW (ref 4.22–5.81)
RDW: 14.6 % (ref 11.5–15.5)
WBC: 4.6 10*3/uL (ref 4.0–10.5)
nRBC: 0 % (ref 0.0–0.2)

## 2022-04-01 LAB — RAPID URINE DRUG SCREEN, HOSP PERFORMED
Amphetamines: NOT DETECTED
Barbiturates: NOT DETECTED
Benzodiazepines: NOT DETECTED
Cocaine: NOT DETECTED
Opiates: NOT DETECTED
Tetrahydrocannabinol: NOT DETECTED

## 2022-04-01 LAB — URINALYSIS, ROUTINE W REFLEX MICROSCOPIC
Bacteria, UA: NONE SEEN
Bilirubin Urine: NEGATIVE
Glucose, UA: NEGATIVE mg/dL
Hgb urine dipstick: NEGATIVE
Ketones, ur: 5 mg/dL — AB
Nitrite: NEGATIVE
Protein, ur: 30 mg/dL — AB
Specific Gravity, Urine: 1.017 (ref 1.005–1.030)
pH: 5 (ref 5.0–8.0)

## 2022-04-01 LAB — COMPREHENSIVE METABOLIC PANEL
ALT: 37 U/L (ref 0–44)
AST: 54 U/L — ABNORMAL HIGH (ref 15–41)
Albumin: 3.8 g/dL (ref 3.5–5.0)
Alkaline Phosphatase: 67 U/L (ref 38–126)
Anion gap: 12 (ref 5–15)
BUN: 12 mg/dL (ref 6–20)
CO2: 21 mmol/L — ABNORMAL LOW (ref 22–32)
Calcium: 8.9 mg/dL (ref 8.9–10.3)
Chloride: 99 mmol/L (ref 98–111)
Creatinine, Ser: 0.92 mg/dL (ref 0.61–1.24)
GFR, Estimated: >60 mL/min (ref >60)
Glucose, Bld: 120 mg/dL — ABNORMAL HIGH (ref 70–99)
Potassium: 3.4 mmol/L — ABNORMAL LOW (ref 3.5–5.1)
Sodium: 132 mmol/L — ABNORMAL LOW (ref 135–145)
Total Bilirubin: 0.7 mg/dL (ref 0.3–1.2)
Total Protein: 7.4 g/dL (ref 6.5–8.1)

## 2022-04-01 LAB — ETHANOL: Alcohol, Ethyl (B): <10 mg/dL (ref <10)

## 2022-04-01 LAB — AMMONIA: Ammonia: 41 umol/L — ABNORMAL HIGH (ref 9–35)

## 2022-04-01 LAB — LIPASE, BLOOD: Lipase: 66 U/L — ABNORMAL HIGH (ref 11–51)

## 2022-04-01 MED ORDER — POTASSIUM CHLORIDE CRYS ER 20 MEQ PO TBCR
40.0000 meq | EXTENDED_RELEASE_TABLET | Freq: Once | ORAL | Status: AC
  Administered 2022-04-01: 40 meq via ORAL
  Filled 2022-04-01: qty 2

## 2022-04-01 MED ORDER — LORAZEPAM 1 MG PO TABS: 0.0000 mg | ORAL_TABLET | Freq: Two times a day (BID) | ORAL | Status: DC

## 2022-04-01 MED ORDER — LORAZEPAM 2 MG/ML IJ SOLN: 0.0000 mg | Freq: Four times a day (QID) | INTRAMUSCULAR | Status: DC

## 2022-04-01 MED ORDER — LORAZEPAM 1 MG PO TABS
0.0000 mg | ORAL_TABLET | Freq: Four times a day (QID) | ORAL | Status: DC
  Administered 2022-04-01 – 2022-04-02 (×2): 1 mg via ORAL
  Filled 2022-04-01 (×2): qty 1

## 2022-04-01 MED ORDER — LORAZEPAM 2 MG/ML IJ SOLN: 0.0000 mg | Freq: Two times a day (BID) | INTRAMUSCULAR | Status: DC

## 2022-04-01 MED ORDER — ONDANSETRON 4 MG PO TBDP
4.0000 mg | ORAL_TABLET | Freq: Once | ORAL | Status: AC
  Administered 2022-04-01: 4 mg via ORAL
  Filled 2022-04-01: qty 1

## 2022-04-01 MED ORDER — ACETAMINOPHEN 325 MG PO TABS
650.0000 mg | ORAL_TABLET | Freq: Once | ORAL | Status: AC
  Administered 2022-04-01: 650 mg via ORAL
  Filled 2022-04-01: qty 2

## 2022-04-01 MED ORDER — THIAMINE HCL 100 MG PO TABS
100.0000 mg | ORAL_TABLET | Freq: Every day | ORAL | Status: DC
  Administered 2022-04-01 – 2022-04-02 (×2): 100 mg via ORAL
  Filled 2022-04-01 (×2): qty 1

## 2022-04-01 NOTE — ED Triage Notes (Addendum)
Pt arrived via [facility] from home.   Pts ex wife called.   Reports pt is hearing voices that are telling him to kill himself.   No plan at this time.   C/O n/v and emesis x2 weeks.   ETOH on board to calm the voices per patient.  Pt does not have a regular counselor at this time.  On Sunday pt had a guest that was staying with them in their home passed away that may have triggered him.   A/oX4 Ambulatory in triage
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
## 2022-04-01 NOTE — ED Notes (Addendum)
Per lab Urine results unavailable due to interfering substance and need recollect.   This RN stood by patient why he voided. Pt had to sit to void due to BM at the same time. This RN did not see pt alter UA.

## 2022-04-01 NOTE — ED Notes (Signed)
Pt items have been placed in locker 39 TCU.

## 2022-04-01 NOTE — ED Notes (Signed)
Spoke to patients wife.   Per wife pt knows pt uses marijuana and alcohol, also has a feeling he has been using crack/cocaine.

## 2022-04-01 NOTE — ED Notes (Signed)
Pt made aware we need UA sample. Pt verbalized understanding and will let this RN know when ready to void.

## 2022-04-01 NOTE — ED Provider Notes (Signed)
[facility] DEPT Provider Note   CSN: [id number] Arrival date & time: 04/01/22  1000     History  Chief Complaint  Patient presents with   Psychiatric Evaluation    Jacob Adams is a 56 y.o. male.  HPI  Patient has history of hypertension, pancreatitis, depression, PTSD, migraines, bipolar, alcoholism and alcohol withdrawal.  Patient presents to the ED with complaints of depression and suicidal ideation.  Patient states he has not been sleeping well.  He is having nightmares.  He is hearing voices that are telling him to kill himself.  He has been having nausea and vomiting for the last couple of weeks.  He has not been able to eat much in the last couple weeks.  He is able to drink alcohol and did have some this morning.  Patient recently had an acquaintance that passed away.  Home Medications Prior to Admission medications   Medication Sig Start Date End Date Taking? Authorizing Provider  ARIPiprazole (ABILIFY) 10 MG tablet Take 5 mg by mouth daily.    [provider]  atorvastatin (LIPITOR) 80 MG tablet Take 40 mg by mouth at bedtime.     [provider]  carvedilol (COREG) 3.125 MG tablet Take 1 tablet (3.125 mg total) by mouth 2 (two) times daily with a meal. 01/27/22   Vann, Tomi Bamberger, DO  chlordiazePOXIDE (LIBRIUM) 25 MG capsule 67m PO TID x 1D, then 25-572mPO BID X 1D, then 25-5036mO QD X 1D 03/25/22   Prosperi, Christian H, PA-C  FLUoxetine (PROZAC) 20 MG capsule Take 20 mg by mouth daily.    [provider]  folic acid (FOLVITE) 1 MG tablet Take 1 mg by mouth daily.    [provider]  hydrOXYzine (ATARAX) 10 MG tablet Take 10 mg by mouth in the morning and at bedtime.    [provider]  lactulose (CHRONULAC) 10 GM/15ML solution Take 45 mLs (30 g total) by mouth 3 (three) times daily. 07/24/20   AdhShelly CossD  latanoprost (XALATAN) 0.005 % ophthalmic solution Place 1 drop into both eyes at  bedtime.    [provider]  levothyroxine (SYNTHROID) 88 MCG tablet Take 88 mcg by mouth daily before breakfast.    [provider]  lidocaine (XYLOCAINE) 5 % ointment Apply 1 application. topically 2 (two) times daily as needed (knee and joint pain).    [provider]  mesalamine (LIALDA) 1.2 g EC tablet Take 4 tablets (4.8 g total) by mouth daily. 07/25/20 01/23/22  AdhShelly CossD  Multiple Vitamin (MULTIVITAMIN WITH MINERALS) TABS tablet Take 1 tablet by mouth daily.    [provider]  ondansetron (ZOFRAN) 4 MG tablet Take 1 tablet (4 mg total) by mouth every 6 (six) hours. 03/25/22   Prosperi, Christian H, PA-C  prazosin (MINIPRESS) 2 MG capsule Take 1 capsule (2 mg total) by mouth at bedtime. 01/27/22   VanGeradine GirtO  rifaximin (XIFAXAN) 550 MG TABS tablet Take 1 tablet (550 mg total) by mouth 2 (two) times daily. Patient taking differently: Take 550 mg by mouth every morning. 07/24/20   AdhShelly CossD  thiamine 100 MG tablet Take 1 tablet (100 mg total) by mouth daily. Patient not taking: Reported on 03/25/2022 07/24/20   AdhShelly CossD      Allergies    Uncoded nonscreenable allergen, Ibuprofen, Nsaids, Penicillins, and [name]    Review of Systems   Review of Systems  Constitutional:  Negative for fever.    Physical Exam Updated Vital Signs BP 132/88 (BP Location: Left Arm)   Pulse 83   Temp 98.3 F (36.8 C) (Oral)   Resp 18   SpO2 97%  Physical Exam Vitals and nursing note reviewed.  Constitutional:      General: He is not in acute distress.    Appearance: He is well-developed.  HENT:     Head: Normocephalic and atraumatic.     Right Ear: External ear normal.     Left Ear: External ear normal.  Eyes:     General: No scleral icterus.       Right eye: No discharge.        Left eye: No discharge.     Conjunctiva/sclera: Conjunctivae normal.  Neck:     Trachea: No tracheal deviation.  Cardiovascular:      Rate and Rhythm: Normal rate and regular rhythm.  Pulmonary:     Effort: Pulmonary effort is normal. No respiratory distress.     Breath sounds: Normal breath sounds. No stridor. No wheezing or rales.  Abdominal:     General: Bowel sounds are normal. There is no distension.     Palpations: Abdomen is soft.     Tenderness: There is no abdominal tenderness. There is no guarding or rebound.  Musculoskeletal:        General: No tenderness or deformity.     Cervical back: Neck supple.  Skin:    General: Skin is warm and dry.     Findings: No rash.  Neurological:     General: No focal deficit present.     Mental Status: He is alert.     Cranial Nerves: No cranial nerve deficit (no facial droop, extraocular movements intact, no slurred speech).     Sensory: No sensory deficit.     Motor: No abnormal muscle tone or seizure activity.     Coordination: Coordination normal.  Psychiatric:        Mood and Affect: Mood is depressed. Affect is blunt.        Speech: Speech is not delayed or tangential.        Behavior: Behavior is withdrawn. Behavior is not aggressive or hyperactive.        Thought Content: Thought content includes suicidal ideation.     ED Results / Procedures / Treatments   Labs (all labs ordered are listed, but only abnormal results are displayed) Labs Reviewed  COMPREHENSIVE METABOLIC PANEL - Abnormal; Notable for the following components:      Result Value   Sodium 132 (*)    Potassium 3.4 (*)    CO2 21 (*)    Glucose, Bld 120 (*)    AST 54 (*)    All other components within normal limits  CBC WITH DIFFERENTIAL/PLATELET - Abnormal; Notable for the following components:   RBC 3.29 (*)    Hemoglobin 11.1 (*)    HCT 32.1 (*)    All other components within normal limits  AMMONIA - Abnormal; Notable for the following components:   Ammonia 41 (*)    All other components within normal limits  LIPASE, BLOOD - Abnormal; Notable for the following components:   Lipase 66  (*)    All other components within normal limits  RESP PANEL BY RT-PCR (FLU A&B, COVID) ARPGX2  ETHANOL  RAPID URINE DRUG SCREEN, HOSP PERFORMED    EKG None  Radiology No results found.  Procedures Procedures    Medications Ordered in ED  Medications  potassium chloride SA (KLOR-CON M) CR tablet 40 mEq (has no administration in time range)  acetaminophen (TYLENOL) tablet 650 mg (has no administration in time range)  LORazepam (ATIVAN) injection 0-4 mg (has no administration in time range)    Or  LORazepam (ATIVAN) tablet 0-4 mg (has no administration in time range)  LORazepam (ATIVAN) injection 0-4 mg (has no administration in time range)    Or  LORazepam (ATIVAN) tablet 0-4 mg (has no administration in time range)  thiamine tablet 100 mg (has no administration in time range)  ondansetron (ZOFRAN-ODT) disintegrating tablet 4 mg (4 mg Oral Given 04/01/22 1122)    ED Course/ Medical Decision Making/ A&P Clinical Course as of 04/01/22 1149  Tue Apr 01, 2022  1123 CBC with Diff(!) Anemia stable [JK]  1123 Lipase, blood(!) Lipase slightly elevated [JK]  1124 Comprehensive metabolic panel(!) Slight decrease in potassium and sodium [JK]  1147 Ammonia(!) Ammonia level slightly increased at 41 but I doubt clinically significant [JK]  1147 Patient appears medically cleared for psychiatric evaluation [JK]    Clinical Course User Index [JK] Dorie Rank, MD                           Medical Decision Making Problems Addressed: Depression, unspecified depression type: acute illness or injury that poses a threat to life or bodily functions Hypokalemia: self-limited or minor problem Suicidal ideation: acute illness or injury that poses a threat to life or bodily functions  Amount and/or Complexity of Data Reviewed External Data Reviewed: notes.    Details: Previous psychiatric evaluations Labs: ordered. Decision-making details documented in ED Course.  Risk OTC  drugs. Prescription drug management. Decision regarding hospitalization. Risk Details: Patient presents ED for evaluation of acute psychiatric illness.  Patient has complained of nausea vomiting emesis.  No signs of severe dehydration.  Likely related to his alcohol use.  Mild electrolyte abnormalities noted but no indication for hospitalization.  No vomiting noted in the ED.  Patient is tolerating p.o.  Will request for psychiatric evaluation as patient may need admission for psychiatric treatment   The patient has been placed in psychiatric observation due to the need to provide a safe environment for the patient while obtaining psychiatric consultation and evaluation, as well as ongoing medical and medication management to treat the patient's condition.  The patient has not been placed under full IVC at this time.   Final Clinical Impression(s) / ED Diagnoses Final diagnoses:  Depression, unspecified depression type  Suicidal ideation  Hypokalemia    Rx / DC Orders ED Discharge Orders     None         Dorie Rank, MD 04/01/22 1151

## 2022-04-01 NOTE — ED Notes (Signed)
Pt has two bags behind Triage nurses station. Pt have a pair of shoes, jeans, T-shirt and a pair of earrings. Second bag has Pt medications.

## 2022-04-02 MED ORDER — FLUOXETINE HCL 20 MG PO CAPS
20.0000 mg | ORAL_CAPSULE | Freq: Every day | ORAL | Status: DC
  Administered 2022-04-02: 20 mg via ORAL
  Filled 2022-04-02: qty 1

## 2022-04-02 MED ORDER — LATANOPROST 0.005 % OP SOLN
1.0000 [drp] | Freq: Every day | OPHTHALMIC | Status: DC
  Filled 2022-04-02: qty 2.5

## 2022-04-02 MED ORDER — HYDROXYZINE HCL 10 MG PO TABS
10.0000 mg | ORAL_TABLET | Freq: Two times a day (BID) | ORAL | Status: DC | PRN
Start: 2022-04-02 — End: 2022-04-02

## 2022-04-02 MED ORDER — ARIPIPRAZOLE 5 MG PO TABS
5.0000 mg | ORAL_TABLET | Freq: Every day | ORAL | Status: DC
  Administered 2022-04-02: 5 mg via ORAL
  Filled 2022-04-02: qty 1

## 2022-04-02 MED ORDER — ONDANSETRON HCL 4 MG PO TABS: 4.0000 mg | ORAL_TABLET | Freq: Three times a day (TID) | ORAL | Status: DC | PRN

## 2022-04-02 MED ORDER — PRAZOSIN HCL 1 MG PO CAPS: 2.0000 mg | ORAL_CAPSULE | Freq: Every day | ORAL | Status: DC

## 2022-04-02 MED ORDER — ATORVASTATIN CALCIUM 40 MG PO TABS: 40.0000 mg | ORAL_TABLET | Freq: Every day | ORAL | Status: DC

## 2022-04-02 MED ORDER — CARVEDILOL 3.125 MG PO TABS: 3.1250 mg | ORAL_TABLET | Freq: Two times a day (BID) | ORAL | Status: DC

## 2022-04-02 MED ORDER — LEVOTHYROXINE SODIUM 88 MCG PO TABS: 88.0000 ug | ORAL_TABLET | Freq: Every day | ORAL | Status: DC

## 2022-04-02 NOTE — BH Assessment (Signed)
[facility] Assessment Progress Note   Per Charmaine Downs, NP, this pt requires psychiatric hospitalization at this time.  At 15:26 Wells Guiles calls from [facility].  Pt has been accepted to their facility by Myles Lipps, MD.  EDP Madalyn Rob, MD, concurs with this disposition, as does the pt who is currently under voluntary status.  Dr Roslynn Amble and pt's nurse, [place], have been notified, and [place] agrees to call report to [phone].  Pt is to be transported via [facility].  Form has been completed to transfer pt over 75 miles, and it has been sent by e-mail attachment to  Transportation.  Jalene Mullet, [place] Coordinator [phone]

## 2022-04-02 NOTE — ED Notes (Signed)
Safe transport called to pick up patient.

## 2022-04-02 NOTE — ED Provider Notes (Signed)
Emergency Medicine Observation Re-evaluation Note  Jacob Adams is a 56 y.o. male, seen on rounds today.  Pt initially presented to the ED for complaints of Psychiatric Evaluation Currently, the patient is resting quietly.  Physical Exam  BP 121/80 (BP Location: Right Arm)   Pulse 66   Temp 98.5 F (36.9 C) (Oral)   Resp 18   SpO2 99%  Physical Exam General: No acute distress Cardiac: Well-perfused Lungs: Nonlabored Psych: Cooperative  ED Course / MDM  EKG:   I have reviewed the labs performed to date as well as medications administered while in observation.  Recent changes in the last 24 hours include psychiatric evaluation.  Plan  Current plan is for inpatient psychiatric admission.  Jacob Adams is not under involuntary commitment.     Hayden Rasmussen, MD 04/02/22 [phone]

## 2022-04-24 ENCOUNTER — Other Ambulatory Visit (INDEPENDENT_AMBULATORY_CARE_PROVIDER_SITE_OTHER): Payer: No Typology Code available for payment source

## 2022-04-24 ENCOUNTER — Ambulatory Visit (INDEPENDENT_AMBULATORY_CARE_PROVIDER_SITE_OTHER): Payer: No Typology Code available for payment source | Admitting: Gastroenterology

## 2022-04-24 ENCOUNTER — Encounter: Payer: Self-pay | Admitting: Gastroenterology

## 2022-04-24 VITALS — BP 110/60 | HR 96 | Ht 70.0 in | Wt 222.0 lb

## 2022-04-24 DIAGNOSIS — K529 Noninfective gastroenteritis and colitis, unspecified: Secondary | ICD-10-CM | POA: Diagnosis not present

## 2022-04-24 DIAGNOSIS — Z862 Personal history of diseases of the blood and blood-forming organs and certain disorders involving the immune mechanism: Secondary | ICD-10-CM

## 2022-04-24 DIAGNOSIS — K766 Portal hypertension: Secondary | ICD-10-CM

## 2022-04-24 DIAGNOSIS — K625 Hemorrhage of anus and rectum: Secondary | ICD-10-CM | POA: Diagnosis not present

## 2022-04-24 DIAGNOSIS — K3189 Other diseases of stomach and duodenum: Secondary | ICD-10-CM

## 2022-04-24 DIAGNOSIS — K7030 Alcoholic cirrhosis of liver without ascites: Secondary | ICD-10-CM | POA: Diagnosis not present

## 2022-04-24 DIAGNOSIS — F1020 Alcohol dependence, uncomplicated: Secondary | ICD-10-CM

## 2022-04-24 LAB — CBC WITH DIFFERENTIAL/PLATELET
Basophils Absolute: 0.1 10*3/uL (ref 0.0–0.1)
Basophils Relative: 1 % (ref 0.0–3.0)
Eosinophils Absolute: 0.1 10*3/uL (ref 0.0–0.7)
Eosinophils Relative: 1.4 % (ref 0.0–5.0)
HCT: 32.6 % — ABNORMAL LOW (ref 39.0–52.0)
Hemoglobin: 10.7 g/dL — ABNORMAL LOW (ref 13.0–17.0)
Lymphocytes Relative: 20.5 % (ref 12.0–46.0)
Lymphs Abs: 1.6 10*3/uL (ref 0.7–4.0)
MCHC: 32.9 g/dL (ref 30.0–36.0)
MCV: 101 fl — ABNORMAL HIGH (ref 78.0–100.0)
Monocytes Absolute: 0.7 10*3/uL (ref 0.1–1.0)
Monocytes Relative: 8.9 % (ref 3.0–12.0)
Neutro Abs: 5.3 10*3/uL (ref 1.4–7.7)
Neutrophils Relative %: 68.2 % (ref 43.0–77.0)
Platelets: 331 10*3/uL (ref 150.0–400.0)
RBC: 3.23 Mil/uL — ABNORMAL LOW (ref 4.22–5.81)
RDW: 15.2 % (ref 11.5–15.5)
WBC: 7.7 10*3/uL (ref 4.0–10.5)

## 2022-04-24 LAB — COMPREHENSIVE METABOLIC PANEL
ALT: 16 U/L (ref 0–53)
AST: 15 U/L (ref 0–37)
Albumin: 4 g/dL (ref 3.5–5.2)
Alkaline Phosphatase: 63 U/L (ref 39–117)
BUN: 8 mg/dL (ref 6–23)
CO2: 23 mEq/L (ref 19–32)
Calcium: 8.7 mg/dL (ref 8.4–10.5)
Chloride: 108 mEq/L (ref 96–112)
Creatinine, Ser: 0.9 mg/dL (ref 0.40–1.50)
GFR: 95.17 mL/min (ref >60.00)
Glucose, Bld: 98 mg/dL (ref 70–99)
Potassium: 3.8 mEq/L (ref 3.5–5.1)
Sodium: 139 mEq/L (ref 135–145)
Total Bilirubin: 0.3 mg/dL (ref 0.2–1.2)
Total Protein: 6.9 g/dL (ref 6.0–8.3)

## 2022-04-24 LAB — C-REACTIVE PROTEIN: CRP: 1.4 mg/dL (ref 0.5–20.0)

## 2022-04-24 LAB — IBC + FERRITIN
Ferritin: 67.5 ng/mL (ref 22.0–322.0)
Iron: 36 ug/dL — ABNORMAL LOW (ref 42–165)
Saturation Ratios: 13.5 % — ABNORMAL LOW (ref 20.0–50.0)
TIBC: 267.4 ug/dL (ref 250.0–450.0)
Transferrin: 191 mg/dL — ABNORMAL LOW (ref 212.0–360.0)

## 2022-04-24 LAB — PROTIME-INR
INR: 1 ratio (ref 0.8–1.0)
Prothrombin Time: 11 s (ref 9.6–13.1)

## 2022-04-24 LAB — TSH: TSH: 0.96 u[IU]/mL (ref 0.35–5.50)

## 2022-04-24 LAB — SEDIMENTATION RATE: Sed Rate: 40 mm/hr — ABNORMAL HIGH (ref 0–20)

## 2022-04-24 MED ORDER — MESALAMINE 1.2 G PO TBEC: 4.8000 g | DELAYED_RELEASE_TABLET | Freq: Every day | ORAL | 6 refills | Status: DC

## 2022-04-24 MED ORDER — NA SULFATE-K SULFATE-MG SULF 17.5-3.13-1.6 GM/177ML PO SOLN: 1.0000 | Freq: Once | ORAL | 0 refills | Status: AC

## 2022-04-24 MED ORDER — HYDROCORTISONE ACETATE 25 MG RE SUPP: 25.0000 mg | Freq: Every day | RECTAL | 2 refills | Status: DC

## 2022-04-24 NOTE — Patient Instructions (Signed)
Your provider has requested that you go to the basement level for lab work before leaving today. Press "B" on the elevator. The lab is located at the first door on the left as you exit the elevator.  You have been scheduled for an endoscopy and colonoscopy. Please follow the written instructions given to you at your visit today. Please pick up your prep supplies at the pharmacy within the next 1-3 days. If you use inhalers (even only as needed), please bring them with you on the day of your procedure.  We have sent the following medications to your pharmacy for you to pick up at your convenience: Lialda 4.8 g daily Anusol HC suppositories-use 1 suppository every night x 12 nights. If you need refills, you should then use 1 suppository every OTHER night  If you are age 57 or older, your body mass index should be between 23-30. Your Body mass index is 31.85 kg/m. If this is out of the aforementioned range listed, please consider follow up with your Primary Care Provider.  If you are age 9 or younger, your body mass index should be between 19-25. Your Body mass index is 31.85 kg/m. If this is out of the aformentioned range listed, please consider follow up with your Primary Care Provider.   ________________________________________________________  The [facility] providers would like to encourage you to use [facility] to communicate with providers for non-urgent requests or questions.  Due to long hold times on the telephone, sending your provider a message by [facility] may be a faster and more efficient way to get a response.  Please allow 48 business hours for a response.  Please remember that this is for non-urgent requests.  _______________________________________________________  Due to recent changes in healthcare laws, you may see the results of your imaging and laboratory studies on MyChart before your provider has had a chance to review them.  We understand that in some cases there may be  results that are confusing or concerning to you. Not all laboratory results come back in the same time frame and the provider may be waiting for multiple results in order to interpret others.  Please give [place] 48 hours in order for your provider to thoroughly review all the results before contacting the office for clarification of your results.
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
## 2022-04-25 ENCOUNTER — Telehealth: Payer: Self-pay | Admitting: Gastroenterology

## 2022-04-25 ENCOUNTER — Other Ambulatory Visit: Payer: Self-pay

## 2022-04-25 ENCOUNTER — Encounter: Payer: Self-pay | Admitting: Gastroenterology

## 2022-04-25 DIAGNOSIS — K529 Noninfective gastroenteritis and colitis, unspecified: Secondary | ICD-10-CM | POA: Insufficient documentation

## 2022-04-25 DIAGNOSIS — Z862 Personal history of diseases of the blood and blood-forming organs and certain disorders involving the immune mechanism: Secondary | ICD-10-CM | POA: Insufficient documentation

## 2022-04-25 DIAGNOSIS — K7030 Alcoholic cirrhosis of liver without ascites: Secondary | ICD-10-CM | POA: Insufficient documentation

## 2022-04-25 DIAGNOSIS — K3189 Other diseases of stomach and duodenum: Secondary | ICD-10-CM | POA: Insufficient documentation

## 2022-04-25 LAB — IGA: Immunoglobulin A: 137 mg/dL (ref 47–310)

## 2022-04-25 LAB — TISSUE TRANSGLUTAMINASE, IGA: (tTG) Ab, IgA: <1 U/mL

## 2022-04-25 NOTE — Telephone Encounter (Signed)
Inbound call from [place] patient is due for procedure at the [facility] on 7/24. Pharmacy does not carry Suprep . Pharmacy is requesting a prescription be sent in for ''Go lightly''. Please give a call back to advise.  Thank you

## 2022-04-25 NOTE — Telephone Encounter (Signed)
I have contacted patient to advise I have placed a Plenvu kit and updated instructions at the front desk for him to pick up. This will keep him from having to use golytely. Patient verbalizes understanding of this information.

## 2022-04-30 ENCOUNTER — Telehealth: Payer: Self-pay | Admitting: Gastroenterology

## 2022-04-30 NOTE — Telephone Encounter (Signed)
The pt wanted to switch prep to Golytely but he picked up the plenvu left at the front desk by Quintin Alto CMA.  He states he will use the plenvu and does not need another prep sent.  He was confused on the prep and is not sure why he called to get another one sent in.  We discussed the plenvu prep and he has no further questions or concerns.

## 2022-05-06 ENCOUNTER — Encounter (HOSPITAL_COMMUNITY): Payer: Self-pay | Admitting: Gastroenterology

## 2022-05-12 ENCOUNTER — Encounter (HOSPITAL_COMMUNITY): Payer: Self-pay | Admitting: Gastroenterology

## 2022-05-12 NOTE — Anesthesia Preprocedure Evaluation (Signed)
Anesthesia Evaluation  Patient identified by MRN, date of birth, ID band Patient awake    Reviewed: Allergy & Precautions, NPO status , Patient's Chart, lab work & pertinent test results, reviewed documented beta blocker date and time   Airway Mallampati: III  TM Distance: >3 FB Neck ROM: Full    Dental  (+) Edentulous Upper, Edentulous Lower   Pulmonary sleep apnea , Current Smoker and Patient abstained from smoking.,    Pulmonary exam normal breath sounds clear to auscultation       Cardiovascular hypertension, Pt. on medications and Pt. on home beta blockers +CHF  Normal cardiovascular exam Rhythm:Regular Rate:Normal     Neuro/Psych  Headaches, PSYCHIATRIC DISORDERS Anxiety Depression Peripheral neuropathy  Neuromuscular disease    GI/Hepatic PUD, GERD  Medicated and Controlled,(+) Cirrhosis     substance abuse  alcohol use, Hx/o portal hypertensive gastropathy   Endo/Other  Hypothyroidism Hyperlipidemia  Renal/GU   negative genitourinary   Musculoskeletal  (+) Arthritis , Osteoarthritis,    Abdominal (+) + obese,   Peds  Hematology  (+) Blood dyscrasia, anemia ,   Anesthesia Other Findings   Reproductive/Obstetrics                           Anesthesia Physical Anesthesia Plan  ASA: 3  Anesthesia Plan: MAC   Post-op Pain Management: Minimal or no pain anticipated   Induction: Intravenous  PONV Risk Score and Plan: 1 and Propofol infusion and Treatment may vary due to age or medical condition  Airway Management Planned: Natural Airway and Simple Face Mask  Additional Equipment:   Intra-op Plan:   Post-operative Plan:   Informed Consent: I have reviewed the patients History and Physical, chart, labs and discussed the procedure including the risks, benefits and alternatives for the proposed anesthesia with the patient or authorized representative who has indicated his/her  understanding and acceptance.     Dental advisory given  Plan Discussed with: CRNA and Anesthesiologist  Anesthesia Plan Comments:        Anesthesia Quick Evaluation

## 2022-05-13 ENCOUNTER — Ambulatory Visit (HOSPITAL_COMMUNITY): Payer: No Typology Code available for payment source | Admitting: Anesthesiology

## 2022-05-13 ENCOUNTER — Ambulatory Visit (HOSPITAL_BASED_OUTPATIENT_CLINIC_OR_DEPARTMENT_OTHER): Payer: No Typology Code available for payment source | Admitting: Anesthesiology

## 2022-05-13 ENCOUNTER — Ambulatory Visit (HOSPITAL_COMMUNITY)
Admission: RE | Admit: 2022-05-13 | Discharge: 2022-05-13 | Disposition: A | Discharge status: Home / Self Care | Payer: No Typology Code available for payment source | Attending: Gastroenterology | Admitting: Gastroenterology

## 2022-05-13 ENCOUNTER — Other Ambulatory Visit: Payer: Self-pay

## 2022-05-13 ENCOUNTER — Encounter (HOSPITAL_COMMUNITY)
Admission: RE | Disposition: A | Discharge status: Home / Self Care | Payer: Self-pay | Source: Home / Self Care | Attending: Gastroenterology

## 2022-05-13 ENCOUNTER — Encounter (HOSPITAL_COMMUNITY): Payer: Self-pay | Admitting: Gastroenterology

## 2022-05-13 DIAGNOSIS — K209 Esophagitis, unspecified without bleeding: Secondary | ICD-10-CM

## 2022-05-13 DIAGNOSIS — I110 Hypertensive heart disease with heart failure: Secondary | ICD-10-CM | POA: Diagnosis not present

## 2022-05-13 DIAGNOSIS — K642 Third degree hemorrhoids: Secondary | ICD-10-CM | POA: Diagnosis not present

## 2022-05-13 DIAGNOSIS — K449 Diaphragmatic hernia without obstruction or gangrene: Secondary | ICD-10-CM | POA: Insufficient documentation

## 2022-05-13 DIAGNOSIS — K7030 Alcoholic cirrhosis of liver without ascites: Secondary | ICD-10-CM | POA: Insufficient documentation

## 2022-05-13 DIAGNOSIS — K766 Portal hypertension: Secondary | ICD-10-CM

## 2022-05-13 DIAGNOSIS — K3189 Other diseases of stomach and duodenum: Secondary | ICD-10-CM | POA: Insufficient documentation

## 2022-05-13 DIAGNOSIS — G4730 Sleep apnea, unspecified: Secondary | ICD-10-CM | POA: Insufficient documentation

## 2022-05-13 DIAGNOSIS — K635 Polyp of colon: Secondary | ICD-10-CM | POA: Diagnosis not present

## 2022-05-13 DIAGNOSIS — K51011 Ulcerative (chronic) pancolitis with rectal bleeding: Secondary | ICD-10-CM | POA: Diagnosis not present

## 2022-05-13 DIAGNOSIS — K219 Gastro-esophageal reflux disease without esophagitis: Secondary | ICD-10-CM | POA: Diagnosis present

## 2022-05-13 DIAGNOSIS — K2100 Gastro-esophageal reflux disease with esophagitis, without bleeding: Secondary | ICD-10-CM | POA: Diagnosis not present

## 2022-05-13 DIAGNOSIS — Z79899 Other long term (current) drug therapy: Secondary | ICD-10-CM | POA: Insufficient documentation

## 2022-05-13 DIAGNOSIS — I509 Heart failure, unspecified: Secondary | ICD-10-CM | POA: Insufficient documentation

## 2022-05-13 DIAGNOSIS — K644 Residual hemorrhoidal skin tags: Secondary | ICD-10-CM | POA: Diagnosis not present

## 2022-05-13 DIAGNOSIS — K2950 Unspecified chronic gastritis without bleeding: Secondary | ICD-10-CM | POA: Insufficient documentation

## 2022-05-13 DIAGNOSIS — F17210 Nicotine dependence, cigarettes, uncomplicated: Secondary | ICD-10-CM | POA: Diagnosis not present

## 2022-05-13 DIAGNOSIS — K6389 Other specified diseases of intestine: Secondary | ICD-10-CM

## 2022-05-13 DIAGNOSIS — K7460 Unspecified cirrhosis of liver: Secondary | ICD-10-CM | POA: Diagnosis not present

## 2022-05-13 DIAGNOSIS — K2980 Duodenitis without bleeding: Secondary | ICD-10-CM | POA: Insufficient documentation

## 2022-05-13 DIAGNOSIS — K529 Noninfective gastroenteritis and colitis, unspecified: Secondary | ICD-10-CM

## 2022-05-13 DIAGNOSIS — Z862 Personal history of diseases of the blood and blood-forming organs and certain disorders involving the immune mechanism: Secondary | ICD-10-CM

## 2022-05-13 DIAGNOSIS — Z8711 Personal history of peptic ulcer disease: Secondary | ICD-10-CM | POA: Insufficient documentation

## 2022-05-13 HISTORY — PX: ESOPHAGOGASTRODUODENOSCOPY (EGD) WITH PROPOFOL: SHX5813

## 2022-05-13 HISTORY — PX: BIOPSY: SHX5522

## 2022-05-13 HISTORY — PX: POLYPECTOMY: SHX5525

## 2022-05-13 HISTORY — PX: COLONOSCOPY WITH PROPOFOL: SHX5780

## 2022-05-13 SURGERY — ESOPHAGOGASTRODUODENOSCOPY (EGD) WITH PROPOFOL
Anesthesia: Monitor Anesthesia Care

## 2022-05-13 MED ORDER — MIDAZOLAM HCL 2 MG/2ML IJ SOLN
INTRAMUSCULAR | Status: AC
  Filled 2022-05-13: qty 2

## 2022-05-13 MED ORDER — PROPOFOL 10 MG/ML IV BOLUS
INTRAVENOUS | Status: DC | PRN
  Administered 2022-05-13: 40 mg via INTRAVENOUS
  Administered 2022-05-13 (×2): 20 mg via INTRAVENOUS

## 2022-05-13 MED ORDER — HYDROCORTISONE ACETATE 25 MG RE SUPP: 25.0000 mg | Freq: Every day | RECTAL | 6 refills | Status: DC

## 2022-05-13 MED ORDER — LACTATED RINGERS IV SOLN: INTRAVENOUS | Status: DC

## 2022-05-13 MED ORDER — PHENYLEPHRINE 80 MCG/ML (10ML) SYRINGE FOR IV PUSH (FOR BLOOD PRESSURE SUPPORT)
PREFILLED_SYRINGE | INTRAVENOUS | Status: DC | PRN
  Administered 2022-05-13: 80 ug via INTRAVENOUS

## 2022-05-13 MED ORDER — FENTANYL CITRATE (PF) 100 MCG/2ML IJ SOLN
INTRAMUSCULAR | Status: DC | PRN
  Administered 2022-05-13 (×4): 25 ug via INTRAVENOUS

## 2022-05-13 MED ORDER — FENTANYL CITRATE (PF) 100 MCG/2ML IJ SOLN
INTRAMUSCULAR | Status: AC
  Filled 2022-05-13: qty 2

## 2022-05-13 MED ORDER — PROPOFOL 1000 MG/100ML IV EMUL
INTRAVENOUS | Status: AC
  Filled 2022-05-13: qty 100

## 2022-05-13 MED ORDER — PROPOFOL 500 MG/50ML IV EMUL
INTRAVENOUS | Status: DC | PRN
  Administered 2022-05-13: 125 ug/kg/min via INTRAVENOUS

## 2022-05-13 MED ORDER — SODIUM CHLORIDE 0.9 % IV SOLN: INTRAVENOUS | Status: DC

## 2022-05-13 MED ORDER — MIDAZOLAM HCL 2 MG/2ML IJ SOLN
INTRAMUSCULAR | Status: DC | PRN
  Administered 2022-05-13: 2 mg via INTRAVENOUS

## 2022-05-13 MED ORDER — ESOMEPRAZOLE MAGNESIUM 40 MG PO CPDR: 40.0000 mg | DELAYED_RELEASE_CAPSULE | Freq: Every day | ORAL | 12 refills | Status: DC

## 2022-05-13 MED ORDER — LIDOCAINE 2% (20 MG/ML) 5 ML SYRINGE
INTRAMUSCULAR | Status: DC | PRN
  Administered 2022-05-13: 60 mg via INTRAVENOUS

## 2022-05-13 MED ORDER — DEXMEDETOMIDINE (PRECEDEX) IN NS 20 MCG/5ML (4 MCG/ML) IV SYRINGE
PREFILLED_SYRINGE | INTRAVENOUS | Status: DC | PRN
  Administered 2022-05-13 (×2): 8 ug via INTRAVENOUS

## 2022-05-13 SURGICAL SUPPLY — 25 items

## 2022-05-13 NOTE — Discharge Instructions (Signed)
YOU HAD AN ENDOSCOPIC PROCEDURE TODAY: Refer to the procedure report and other information in the discharge instructions given to you for any specific questions about what was found during the examination. If this information does not answer your questions, please call [place] office at [phone] to clarify.  ° °YOU SHOULD EXPECT: Some feelings of bloating in the abdomen. Passage of more gas than usual. Walking can help get rid of the air that was put into your GI tract during the procedure and reduce the bloating. If you had a lower endoscopy (such as a colonoscopy or flexible sigmoidoscopy) you may notice spotting of blood in your stool or on the toilet paper. Some abdominal soreness may be present for a day or two, also. ° °DIET: Your first meal following the procedure should be a light meal and then it is ok to progress to your normal diet. A half-sandwich or bowl of soup is an example of a good first meal. Heavy or fried foods are harder to digest and may make you feel nauseous or bloated. Drink plenty of fluids but you should avoid alcoholic beverages for 24 hours. If you had a esophageal dilation, please see attached instructions for diet.   ° °ACTIVITY: Your care partner should take you home directly after the procedure. You should plan to take it easy, moving slowly for the rest of the day. You can resume normal activity the day after the procedure however YOU SHOULD NOT DRIVE, use power tools, machinery or perform tasks that involve climbing or major physical exertion for 24 hours (because of the sedation medicines used during the test).  ° °SYMPTOMS TO REPORT IMMEDIATELY: °A gastroenterologist can be reached at any hour. Please call [phone]  for any of the following symptoms:  °Following lower endoscopy (colonoscopy, flexible sigmoidoscopy) °Excessive amounts of blood in the stool  °Significant tenderness, worsening of abdominal pains  °Swelling of the abdomen that is new, acute  °Fever of 100° or  higher  °Following upper endoscopy (EGD, EUS, ERCP, esophageal dilation) °Vomiting of blood or coffee ground material  °New, significant abdominal pain  °New, significant chest pain or pain under the shoulder blades  °Painful or persistently difficult swallowing  °New shortness of breath  °Black, tarry-looking or red, bloody stools ° °FOLLOW UP:  °If any biopsies were taken you will be contacted by phone or by letter within the next 1-3 weeks. Call [phone]  if you have not heard about the biopsies in 3 weeks.  °Please also call with any specific questions about appointments or follow up tests. ° °

## 2022-05-13 NOTE — Anesthesia Procedure Notes (Signed)
Procedure Name: MAC Date/Time: 05/13/2022 7:42 AM  Performed by: Claudia Desanctis, CRNAPre-anesthesia Checklist: Patient identified, Emergency Drugs available, Suction available and Patient being monitored Patient Re-evaluated:Patient Re-evaluated prior to induction Oxygen Delivery Method: Simple face mask

## 2022-05-13 NOTE — Op Note (Signed)
[facility] Patient Name: Jacob Adams Procedure Date: 05/13/2022 MRN: [id number] Attending MD: Corliss Parish , MD Date of Birth: 02/20/1966 CSN: [id number] Age: 56 Admit Type: Outpatient Procedure:                Colonoscopy Indications:              Rectal bleeding, Follow-up of chronic ulcerative                            pancolitis, Disease activity assessment of chronic                            ulcerative pancolitis, Assess therapeutic response                            to therapy of chronic ulcerative pancolitis Providers:                Corliss Parish, MD, Dow Adolph, RN, Kandice Robinsons, Technician Referring MD:             Corliss Parish, MD Medicines:                Monitored Anesthesia Care Complications:            No immediate complications. Estimated Blood Loss:     Estimated blood loss was minimal. Procedure:                Pre-Anesthesia Assessment:                           - Prior to the procedure, a History and Physical                            was performed, and patient medications and                            allergies were reviewed. The patient's tolerance of                            previous anesthesia was also reviewed. The risks                            and benefits of the procedure and the sedation                            options and risks were discussed with the patient.                            All questions were answered, and informed consent                            was obtained. Prior Anticoagulants: The patient has                            taken  no previous anticoagulant or antiplatelet                            agents. ASA Grade Assessment: III - A patient with                            severe systemic disease. After reviewing the risks                            and benefits, the patient was deemed in                            satisfactory condition to undergo the  procedure.                           After obtaining informed consent, the colonoscope                            was passed under direct vision. Throughout the                            procedure, the patient's blood pressure, pulse, and                            oxygen saturations were monitored continuously. The                            CF-HQ190L ([id number]) Olympus colonoscope was                            introduced through the anus and advanced to the 5                            cm into the ileum. The colonoscopy was performed                            without difficulty. The patient tolerated the                            procedure. The quality of the bowel preparation was                            adequate. The terminal ileum, ileocecal valve,                            appendiceal orifice, and rectum were photographed. Scope In: 8:05:41 AM Scope Out: 8:24:36 AM Scope Withdrawal Time: 0 hours 15 minutes 1 second  Total Procedure Duration: 0 hours 18 minutes 55 seconds  Findings:      The digital rectal exam findings include hemorrhoids. Pertinent       negatives include no palpable rectal lesions.      The terminal ileum and ileocecal valve appeared normal.      A moderate amount of semi-liquid stool was found in the entire colon,  interfering with visualization. Lavage of the area was performed using       copious amounts, resulting in clearance with adequate visualization.      Two sessile polyps were found in the recto-sigmoid colon. The polyps       were 3 to 4 mm in size. These polyps were removed with a cold snare.       Resection and retrieval were complete.      Normal mucosa was found in the cecum, ascending colon, and transverse       colon. Biopsies were taken with a cold forceps for histology.      Patchy areas of mildly erythematous mucosa was found in the       recto-sigmoid colon, in the sigmoid colon and in the descending colon.       This was  biopsied with a cold forceps for histology.      Normal mucosa was found in the rectum. Biopsies were taken with a cold       forceps for histology.      Non-bleeding non-thrombosed external and internal hemorrhoids were found       during retroflexion, during perianal exam and during digital exam. The       hemorrhoids were Grade III (internal hemorrhoids that prolapse but       require manual reduction). Impression:               - Hemorrhoids found on digital rectal exam.                           - The examined portion of the ileum was normal.                           - Stool in the entire examined colon.                           - Two 3 to 4 mm polyps at the recto-sigmoid colon,                            removed with a cold snare. Resected and retrieved.                           - Normal mucosa in the right colon. Biopsied.                           - Erythematous mucosa in the left colon. Biopsied.                           - Normal mucosa in the rectum. Biopsied.                           - Non-bleeding non-thrombosed external and internal                            hemorrhoids. Moderate Sedation:      Not Applicable - Patient had care per Anesthesia. Recommendation:           - The patient will be observed post-procedure,  until all discharge criteria are met.                           - Discharge patient to home.                           - Patient has a contact number available for                            emergencies. The signs and symptoms of potential                            delayed complications were discussed with the                            patient. Return to normal activities tomorrow.                            Written discharge instructions were provided to the                            patient.                           - High fiber diet.                           - Use FiberCon 1-2 tablets PO daily.                           -  Await pathology results.                           - Start Anusol suppositories. Nightly for 1 week                            and then every other night until prescription of 12                            suppositories are completed. Can have extra                            suppository refills so that if issues arise in                            future, we can use for 3 nights in a row thereafter.                           - Pending final pathology, will also consider role                            of hemorrhoidal banding with one of my partners as  long as severe inflammation is not noted on final                            surveillance biopsies.                           - Continue present medications otherwise.                           - Repeat colonoscopy in 1-2 years for surveillance                            based on pathology results.                           - The findings and recommendations were discussed                            with the patient.                           - The findings and recommendations were discussed                            with the patient's family. Procedure Code(s):        --- Professional ---                           [phone], Colonoscopy, flexible; with removal of                            tumor(s), polyp(s), or other lesion(s) by snare                            technique                           45380, 59, Colonoscopy, flexible; with biopsy,                            single or multiple Diagnosis Code(s):        --- Professional ---                           K64.2, Third degree hemorrhoids                           K51.011, Ulcerative (chronic) pancolitis with                            rectal bleeding                           K63.5, Polyp of colon                           K63.89, Other specified diseases of intestine  K62.5, Hemorrhage of anus and rectum CPT copyright 2019 American  Medical Association. All rights reserved. The codes documented in this report are preliminary and upon coder review may  be revised to meet current compliance requirements. Corliss Parish, MD 05/13/2022 8:44:54 AM Number of Addenda: 0
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
## 2022-05-13 NOTE — Anesthesia Postprocedure Evaluation (Signed)
Anesthesia Post Note  Patient: MATE ALEGRIA  Procedure(s) Performed: ESOPHAGOGASTRODUODENOSCOPY (EGD) WITH PROPOFOL COLONOSCOPY WITH PROPOFOL BIOPSY POLYPECTOMY     Patient location during evaluation: PACU Anesthesia Type: MAC Level of consciousness: awake and alert Pain management: pain level controlled Vital Signs Assessment: post-procedure vital signs reviewed and stable Respiratory status: spontaneous breathing, nonlabored ventilation and respiratory function stable Cardiovascular status: stable and blood pressure returned to baseline Postop Assessment: no apparent nausea or vomiting Anesthetic complications: no   No notable events documented.  Last Vitals:  Vitals:   05/13/22 0830 05/13/22 0840  BP: (!) 78/57 104/63  Pulse: 72 66  Resp: 15 16  Temp: 36.7 C   SpO2: 97% 98%    Last Pain:  Vitals:   05/13/22 0830  TempSrc: Oral  PainSc: 0-No pain                 Ivonna Kinnick A.

## 2022-05-13 NOTE — Transfer of Care (Signed)
Immediate Anesthesia Transfer of Care Note  Patient: Jacob Adams  Procedure(s) Performed: ESOPHAGOGASTRODUODENOSCOPY (EGD) WITH PROPOFOL COLONOSCOPY WITH PROPOFOL BIOPSY POLYPECTOMY  Patient Location: Endoscopy Unit  Anesthesia Type:MAC  Level of Consciousness: awake and patient cooperative  Airway & Oxygen Therapy: Patient Spontanous Breathing and Patient connected to face mask  Post-op Assessment: Report given to RN and Post -op Vital signs reviewed and stable  Post vital signs: Reviewed and stable  Last Vitals:  Vitals Value Taken Time  BP    Temp    Pulse 69 05/13/22 0832  Resp 16 05/13/22 0832  SpO2 96 % 05/13/22 0832  Vitals shown include unvalidated device data.  Last Pain:  Vitals:   05/13/22 0702  TempSrc: Oral  PainSc: 0-No pain         Complications: No notable events documented.

## 2022-05-13 NOTE — Op Note (Signed)
[facility] Patient Name: Jacob Adams Procedure Date: 05/13/2022 MRN: [id number] Attending MD: Corliss Parish , MD Date of Birth: August 28, 1966 CSN: [id number] Age: 56 Admit Type: Outpatient Procedure:                Upper GI endoscopy Indications:              Heartburn, Cirrhosis rule out esophageal varices Providers:                Corliss Parish, MD, Janae Sauce. Steele Berg, RN,                            Kandice Robinsons, Technician Referring MD:             Corliss Parish, MD Medicines:                Monitored Anesthesia Care Complications:            No immediate complications. Estimated Blood Loss:     Estimated blood loss was minimal. Procedure:                Pre-Anesthesia Assessment:                           - Prior to the procedure, a History and Physical                            was performed, and patient medications and                            allergies were reviewed. The patient's tolerance of                            previous anesthesia was also reviewed. The risks                            and benefits of the procedure and the sedation                            options and risks were discussed with the patient.                            All questions were answered, and informed consent                            was obtained. Prior Anticoagulants: The patient has                            taken no previous anticoagulant or antiplatelet                            agents. ASA Grade Assessment: III - A patient with                            severe systemic disease. After reviewing the risks  and benefits, the patient was deemed in                            satisfactory condition to undergo the procedure.                           After obtaining informed consent, the endoscope was                            passed under direct vision. Throughout the                            procedure, the patient's blood pressure,  pulse, and                            oxygen saturations were monitored continuously. The                            [id number]-H190 ([id number] Olympus endoscope was introduced                            through the mouth, and advanced to the second part                            of duodenum. The upper GI endoscopy was                            accomplished without difficulty. The patient                            tolerated the procedure. Scope In: Scope Out: Findings:      No gross lesions were noted in the proximal esophagus and in the mid       esophagus.      LA Grade A (one or more mucosal breaks less than 5 mm, not extending       between tops of 2 mucosal folds) esophagitis with no bleeding was found       in the distal esophagus.      The Z-line was irregular and was found 40 cm from the incisors.      There is no endoscopic evidence of varices in the entire esophagus.      A 1 cm hiatal hernia was present.      Mild portal hypertensive gastropathy was found in the gastric fundus and       in the gastric body.      Multiple dispersed small erosions with no bleeding and no stigmata of       recent bleeding were found in the entire examined stomach. Biopsies were       taken with a cold forceps for histology and Helicobacter pylori testing.      There is no endoscopic evidence of varices in the entire examined       stomach.      Patchy mild inflammation characterized by erythema and friability was       found in the duodenal bulb, in the first portion of the duodenum and in  the second portion of the duodenum. Biopsies were taken with a cold       forceps for histology. Impression:               - No gross lesions in esophagus proximally. LA                            Grade A esophagitis with no bleeding. Z-line                            irregular, 40 cm from the incisors. No varices                            noted.                           - 1 cm hiatal hernia.                            - Portal hypertensive gastropathy proximally.                            Erosive gastropathy with no bleeding and no                            stigmata of recent bleeding. Biopsied.                           - Duodenitis. Biopsied. Moderate Sedation:      Not Applicable - Patient had care per Anesthesia. Recommendation:           - Proceed to scheduled colonoscopy.                           - Observe patient's clinical course.                           - Start PPI (Omeprazole or Nexium 40 mg daily)                           - Continue present medications otherwise.                           - Await pathology results.                           - Repeat upper endoscopy in 3 years for                            surveillance for varices surveillance or sooner if                            other issues from a GERD perspective develop.                           - The findings and recommendations were discussed  with the patient.                           - The findings and recommendations were discussed                            with the patient's family. Procedure Code(s):        --- Professional ---                           [id number], Esophagogastroduodenoscopy, flexible,                            transoral; with biopsy, single or multiple Diagnosis Code(s):        --- Professional ---                           K20.90, Esophagitis, unspecified without bleeding                           K22.8, Other specified diseases of esophagus                           K44.9, Diaphragmatic hernia without obstruction or                            gangrene                           K76.6, Portal hypertension                           K31.89, Other diseases of stomach and duodenum                           K29.80, Duodenitis without bleeding                           R12, Heartburn                           K74.60, Unspecified cirrhosis of liver CPT copyright 2019  American Medical Association. All rights reserved. The codes documented in this report are preliminary and upon coder review may  be revised to meet current compliance requirements. Corliss Parish, MD 05/13/2022 8:33:12 AM Number of Addenda: 0

## 2022-05-14 ENCOUNTER — Encounter (HOSPITAL_COMMUNITY): Payer: Self-pay | Admitting: Gastroenterology

## 2022-05-14 ENCOUNTER — Telehealth: Payer: Self-pay | Admitting: Gastroenterology

## 2022-05-14 ENCOUNTER — Encounter: Payer: Self-pay | Admitting: Gastroenterology

## 2022-05-14 LAB — SURGICAL PATHOLOGY

## 2022-05-14 MED ORDER — LANSOPRAZOLE 30 MG PO CPDR: 30.0000 mg | DELAYED_RELEASE_CAPSULE | Freq: Two times a day (BID) | ORAL | 3 refills | Status: DC

## 2022-05-14 NOTE — Telephone Encounter (Signed)
Prevacid twice daily. Thanks. GM

## 2022-05-14 NOTE — Telephone Encounter (Signed)
Prevacid 14m one capsule twice daily sent to pharmacy as requested.

## 2022-05-14 NOTE — Telephone Encounter (Signed)
Is it OK to send Prevacid?   Please advise.

## 2022-05-14 NOTE — Addendum Note (Signed)
Addended by: Stevan Born on: 05/14/2022 04:24 PM   Modules accepted: Orders

## 2022-08-19 ENCOUNTER — Emergency Department (HOSPITAL_COMMUNITY): Payer: No Typology Code available for payment source

## 2022-08-19 ENCOUNTER — Other Ambulatory Visit: Payer: Self-pay

## 2022-08-19 ENCOUNTER — Emergency Department (HOSPITAL_COMMUNITY)
Admission: EM | Admit: 2022-08-19 | Discharge: 2022-08-19 | Disposition: A | Discharge status: Home / Self Care | Payer: No Typology Code available for payment source | Attending: Emergency Medicine | Admitting: Emergency Medicine

## 2022-08-19 DIAGNOSIS — I110 Hypertensive heart disease with heart failure: Secondary | ICD-10-CM | POA: Insufficient documentation

## 2022-08-19 DIAGNOSIS — Z79899 Other long term (current) drug therapy: Secondary | ICD-10-CM | POA: Diagnosis not present

## 2022-08-19 DIAGNOSIS — Y92410 Unspecified street and highway as the place of occurrence of the external cause: Secondary | ICD-10-CM | POA: Diagnosis not present

## 2022-08-19 DIAGNOSIS — R109 Unspecified abdominal pain: Secondary | ICD-10-CM | POA: Diagnosis not present

## 2022-08-19 DIAGNOSIS — M545 Low back pain, unspecified: Secondary | ICD-10-CM | POA: Diagnosis not present

## 2022-08-19 DIAGNOSIS — I509 Heart failure, unspecified: Secondary | ICD-10-CM | POA: Insufficient documentation

## 2022-08-19 DIAGNOSIS — F17200 Nicotine dependence, unspecified, uncomplicated: Secondary | ICD-10-CM | POA: Insufficient documentation

## 2022-08-19 DIAGNOSIS — M542 Cervicalgia: Secondary | ICD-10-CM | POA: Diagnosis present

## 2022-08-19 DIAGNOSIS — R519 Headache, unspecified: Secondary | ICD-10-CM | POA: Diagnosis not present

## 2022-08-19 DIAGNOSIS — M25561 Pain in right knee: Secondary | ICD-10-CM | POA: Insufficient documentation

## 2022-08-19 DIAGNOSIS — R0789 Other chest pain: Secondary | ICD-10-CM | POA: Insufficient documentation

## 2022-08-19 LAB — CBC WITH DIFFERENTIAL/PLATELET
Abs Immature Granulocytes: 0.01 10*3/uL (ref 0.00–0.07)
Basophils Absolute: 0.1 10*3/uL (ref 0.0–0.1)
Basophils Relative: 2 %
Eosinophils Absolute: 0.4 10*3/uL (ref 0.0–0.5)
Eosinophils Relative: 8 %
HCT: 36.3 % — ABNORMAL LOW (ref 39.0–52.0)
Hemoglobin: 12.1 g/dL — ABNORMAL LOW (ref 13.0–17.0)
Immature Granulocytes: 0 %
Lymphocytes Relative: 36 %
Lymphs Abs: 1.7 10*3/uL (ref 0.7–4.0)
MCH: 32.6 pg (ref 26.0–34.0)
MCHC: 33.3 g/dL (ref 30.0–36.0)
MCV: 97.8 fL (ref 80.0–100.0)
Monocytes Absolute: 0.8 10*3/uL (ref 0.1–1.0)
Monocytes Relative: 17 %
Neutro Abs: 1.7 10*3/uL (ref 1.7–7.7)
Neutrophils Relative %: 37 %
Platelets: 306 10*3/uL (ref 150–400)
RBC: 3.71 MIL/uL — ABNORMAL LOW (ref 4.22–5.81)
RDW: 13.7 % (ref 11.5–15.5)
WBC: 4.7 10*3/uL (ref 4.0–10.5)
nRBC: 0 % (ref 0.0–0.2)

## 2022-08-19 LAB — BASIC METABOLIC PANEL
Anion gap: 6 (ref 5–15)
BUN: 19 mg/dL (ref 6–20)
CO2: 27 mmol/L (ref 22–32)
Calcium: 9.3 mg/dL (ref 8.9–10.3)
Chloride: 106 mmol/L (ref 98–111)
Creatinine, Ser: 1.39 mg/dL — ABNORMAL HIGH (ref 0.61–1.24)
GFR, Estimated: 59 mL/min — ABNORMAL LOW (ref >60)
Glucose, Bld: 115 mg/dL — ABNORMAL HIGH (ref 70–99)
Potassium: 4.1 mmol/L (ref 3.5–5.1)
Sodium: 139 mmol/L (ref 135–145)

## 2022-08-19 MED ORDER — IOHEXOL 350 MG/ML SOLN
75.0000 mL | Freq: Once | INTRAVENOUS | Status: AC | PRN
  Administered 2022-08-19: 75 mL via INTRAVENOUS

## 2022-08-19 MED ORDER — METHOCARBAMOL 500 MG PO TABS: 500.0000 mg | ORAL_TABLET | Freq: Two times a day (BID) | ORAL | 0 refills | Status: DC

## 2022-08-19 MED ORDER — ACETAMINOPHEN 500 MG PO TABS
1000.0000 mg | ORAL_TABLET | Freq: Once | ORAL | Status: AC
  Administered 2022-08-19: 1000 mg via ORAL
  Filled 2022-08-19: qty 2
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
## 2022-09-28 ENCOUNTER — Encounter (HOSPITAL_COMMUNITY): Payer: Self-pay

## 2022-09-28 ENCOUNTER — Other Ambulatory Visit: Payer: Self-pay

## 2022-09-28 ENCOUNTER — Emergency Department (HOSPITAL_COMMUNITY)
Admission: EM | Admit: 2022-09-28 | Discharge: 2022-09-28 | Disposition: A | Discharge status: Home / Self Care | Payer: No Typology Code available for payment source | Attending: Emergency Medicine | Admitting: Emergency Medicine

## 2022-09-28 ENCOUNTER — Emergency Department (HOSPITAL_COMMUNITY): Payer: No Typology Code available for payment source

## 2022-09-28 DIAGNOSIS — R519 Headache, unspecified: Secondary | ICD-10-CM | POA: Diagnosis present

## 2022-09-28 DIAGNOSIS — G43701 Chronic migraine without aura, not intractable, with status migrainosus: Secondary | ICD-10-CM | POA: Diagnosis not present

## 2022-09-28 DIAGNOSIS — Z1152 Encounter for screening for COVID-19: Secondary | ICD-10-CM | POA: Diagnosis not present

## 2022-09-28 LAB — CBC WITH DIFFERENTIAL/PLATELET
Abs Immature Granulocytes: 0.01 10*3/uL (ref 0.00–0.07)
Basophils Absolute: 0 10*3/uL (ref 0.0–0.1)
Basophils Relative: 0 %
Eosinophils Absolute: 0.1 10*3/uL (ref 0.0–0.5)
Eosinophils Relative: 1 %
HCT: 42.2 % (ref 39.0–52.0)
Hemoglobin: 14.1 g/dL (ref 13.0–17.0)
Immature Granulocytes: 0 %
Lymphocytes Relative: 6 %
Lymphs Abs: 0.4 10*3/uL — ABNORMAL LOW (ref 0.7–4.0)
MCH: 31.3 pg (ref 26.0–34.0)
MCHC: 33.4 g/dL (ref 30.0–36.0)
MCV: 93.6 fL (ref 80.0–100.0)
Monocytes Absolute: 0.4 10*3/uL (ref 0.1–1.0)
Monocytes Relative: 7 %
Neutro Abs: 5.7 10*3/uL (ref 1.7–7.7)
Neutrophils Relative %: 86 %
Platelets: 289 10*3/uL (ref 150–400)
RBC: 4.51 MIL/uL (ref 4.22–5.81)
RDW: 13.1 % (ref 11.5–15.5)
WBC: 6.6 10*3/uL (ref 4.0–10.5)
nRBC: 0 % (ref 0.0–0.2)

## 2022-09-28 LAB — COMPREHENSIVE METABOLIC PANEL
ALT: 16 U/L (ref 0–44)
AST: 21 U/L (ref 15–41)
Albumin: 3.7 g/dL (ref 3.5–5.0)
Alkaline Phosphatase: 38 U/L (ref 38–126)
Anion gap: 7 (ref 5–15)
BUN: 18 mg/dL (ref 6–20)
CO2: 23 mmol/L (ref 22–32)
Calcium: 8.4 mg/dL — ABNORMAL LOW (ref 8.9–10.3)
Chloride: 105 mmol/L (ref 98–111)
Creatinine, Ser: 1 mg/dL (ref 0.61–1.24)
GFR, Estimated: >60 mL/min (ref >60)
Glucose, Bld: 116 mg/dL — ABNORMAL HIGH (ref 70–99)
Potassium: 4 mmol/L (ref 3.5–5.1)
Sodium: 135 mmol/L (ref 135–145)
Total Bilirubin: 0.8 mg/dL (ref 0.3–1.2)
Total Protein: 7.3 g/dL (ref 6.5–8.1)

## 2022-09-28 LAB — RESP PANEL BY RT-PCR (RSV, FLU A&B, COVID)  RVPGX2
Influenza A by PCR: NEGATIVE
Influenza B by PCR: NEGATIVE
Resp Syncytial Virus by PCR: NEGATIVE
SARS Coronavirus 2 by RT PCR: NEGATIVE

## 2022-09-28 LAB — LIPASE, BLOOD: Lipase: 43 U/L (ref 11–51)

## 2022-09-28 MED ORDER — DEXAMETHASONE SODIUM PHOSPHATE 10 MG/ML IJ SOLN
10.0000 mg | Freq: Once | INTRAMUSCULAR | Status: AC
  Administered 2022-09-28: 10 mg via INTRAVENOUS
  Filled 2022-09-28: qty 1

## 2022-09-28 MED ORDER — DIPHENHYDRAMINE HCL 50 MG/ML IJ SOLN
25.0000 mg | Freq: Once | INTRAMUSCULAR | Status: AC
  Administered 2022-09-28: 25 mg via INTRAVENOUS
  Filled 2022-09-28: qty 1

## 2022-09-28 MED ORDER — MAGNESIUM SULFATE 2 GM/50ML IV SOLN
2.0000 g | Freq: Once | INTRAVENOUS | Status: AC
  Administered 2022-09-28: 2 g via INTRAVENOUS
  Filled 2022-09-28: qty 50

## 2022-09-28 MED ORDER — PROCHLORPERAZINE EDISYLATE 10 MG/2ML IJ SOLN
10.0000 mg | Freq: Once | INTRAMUSCULAR | Status: AC
  Administered 2022-09-28: 10 mg via INTRAVENOUS
  Filled 2022-09-28: qty 2

## 2022-09-28 MED ORDER — SUMATRIPTAN SUCCINATE 6 MG/0.5ML ~~LOC~~ SOLN
6.0000 mg | Freq: Once | SUBCUTANEOUS | Status: AC
  Administered 2022-09-28: 6 mg via SUBCUTANEOUS
  Filled 2022-09-28: qty 0.5

## 2022-09-28 NOTE — ED Provider Triage Note (Signed)
Emergency Medicine Provider Triage Evaluation Note  Jacob Adams , a 56 y.o. male  was evaluated in triage.  Pt complains of migraines for the last 2 weeks.  Patient reports has history of migraines however this migraine is increasing in frequency and severity.  Patient states he is been taking Tylenol at home without relief.  Patient also complaining of abdominal pain, nausea vomiting and diarrhea.  Patient unsure of sick contacts.  Review of Systems  Positive:  Negative:   Physical Exam  BP (!) 143/95   Pulse 82   Temp 98.3 F (36.8 C) (Oral)   Resp 19   SpO2 97%  Gen:   Awake, no distress   Resp:  Normal effort  MSK:   Moves extremities without difficulty  Other:  Normal neuro exam  Medical Decision Making  Medically screening exam initiated at 2:18 PM.  Appropriate orders placed.  [name] was informed that the remainder of the evaluation will be completed by another provider, this initial triage assessment does not replace that evaluation, and the importance of remaining in the ED until their evaluation is complete.     Azucena Cecil, PA-C 09/28/22 1419

## 2022-09-28 NOTE — ED Triage Notes (Signed)
Patient said he has had a migraine for 2 weeks. It makes his eyes hurt. Nauseous, upset stomach and diarrhea.

## 2022-09-28 NOTE — ED Provider Notes (Signed)
[facility] DEPT Provider Note   CSN: [id number] Arrival date & time: 09/28/22  1317     History  Chief Complaint  Patient presents with   Migraine    Jacob Adams is a 56 y.o. male.  HPI     56 y/o comes in with cc of headaches.  Patient stated he has been diagnosed with migraines.  Normally he gets headache once or twice a week, they will last for a day.  However this current headache has been going on for 2 weeks.  The headache is similar to his previous episodes, but it is not responding to over-the-counter medications.  He describes the headache as right-sided, throbbing with associated photophobia.  Patient denies any positional component to the headache, any temporal component to the headache and denies any neck pain.   Patient has history of alcohol use disorder and mental illness.  He gets his care at the [facility]. he states that he has called the [facility] about his headache, but has not received any kind of follow-up appointment.  Home Medications Prior to Admission medications   Medication Sig Start Date End Date Taking? Authorizing Provider  ARIPiprazole (ABILIFY) 10 MG tablet Take 5 mg by mouth daily.    [provider]  atorvastatin (LIPITOR) 80 MG tablet Take 40 mg by mouth at bedtime.     [provider]  carvedilol (COREG) 12.5 MG tablet Take 12.5 mg by mouth 2 (two) times daily with a meal.    [provider]  carvedilol (COREG) 3.125 MG tablet Take 1 tablet (3.125 mg total) by mouth 2 (two) times daily with a meal. Patient not taking: Reported on 05/05/2022 01/27/22   Geradine Girt, DO  chlordiazePOXIDE (LIBRIUM) 25 MG capsule 57m PO TID x 1D, then 25-525mPO BID X 1D, then 25-5062mO QD X 1D 03/25/22   Prosperi, Christian H, PA-C  FLUoxetine (PROZAC) 20 MG capsule Take 20 mg by mouth daily.    [provider]  folic acid (FOLVITE) 1 MG tablet Take 1 mg by mouth daily.    [provider]   hydrocortisone (ANUSOL-HC) 25 MG suppository Place 1 suppository (25 mg total) rectally at bedtime. Nightly for 1 week and then every other night until Rx is complete.  If issues in future, then may use for 3 nights in a row. 05/13/22   Mansouraty, GabTelford NabMD  hydrOXYzine (ATARAX) 10 MG tablet Take 10 mg by mouth 3 (three) times daily as needed for anxiety.    [provider]  lactulose (CHRONULAC) 10 GM/15ML solution Take 45 mLs (30 g total) by mouth 3 (three) times daily. 07/24/20   AdhShelly CossD  lansoprazole (PREVACID) 30 MG capsule Take 1 capsule (30 mg total) by mouth 2 (two) times daily before a meal. 05/14/22   Mansouraty, GabTelford NabMD  latanoprost (XALATAN) 0.005 % ophthalmic solution Place 1 drop into both eyes at bedtime.    [provider]  levothyroxine (SYNTHROID) 88 MCG tablet Take 88 mcg by mouth daily before breakfast.    [provider]  lidocaine (XYLOCAINE) 5 % ointment Apply 1 application  topically 2 (two) times daily as needed (knee and joint pain).    [provider]  mesalamine (LIALDA) 1.2 g EC tablet Take 4 tablets (4.8 g total) by mouth daily. 04/24/22 05/24/22  Mansouraty, GabTelford NabMD  methocarbamol (ROBAXIN) 500 MG tablet Take 1 tablet (500 mg total) by mouth 2 (two) times daily.  08/19/22   Sherrill Raring, PA-C  Multiple Vitamin (MULTIVITAMIN WITH MINERALS) TABS tablet Take 1 tablet by mouth daily.    [provider]  ondansetron (ZOFRAN) 4 MG tablet Take 1 tablet (4 mg total) by mouth every 6 (six) hours. Patient taking differently: Take 4 mg by mouth every 8 (eight) hours as needed for vomiting or nausea. 03/25/22   Prosperi, Christian H, PA-C  prazosin (MINIPRESS) 1 MG capsule Take 1 mg by mouth 2 (two) times daily.    [provider]  prazosin (MINIPRESS) 2 MG capsule Take 1 capsule (2 mg total) by mouth at bedtime. Patient not taking: Reported on 05/05/2022 01/27/22   Geradine Girt, DO  rifaximin (XIFAXAN)  550 MG TABS tablet Take 1 tablet (550 mg total) by mouth 2 (two) times daily. 07/24/20   Shelly Coss, MD  thiamine 100 MG tablet Take 1 tablet (100 mg total) by mouth daily. 07/24/20   Shelly Coss, MD      Allergies    Uncoded nonscreenable allergen, Ibuprofen, Nsaids, Penicillins, and [name]    Review of Systems   Review of Systems  All other systems reviewed and are negative.   Physical Exam Updated Vital Signs BP (!) 139/97   Pulse 72   Temp 98.3 F (36.8 C) (Oral)   Resp 18   SpO2 93%  Physical Exam Vitals and nursing note reviewed.  Constitutional:      Appearance: He is well-developed.  HENT:     Head: Atraumatic.     Mouth/Throat:     Mouth: Mucous membranes are moist.  Eyes:     Extraocular Movements: Extraocular movements intact.     Pupils: Pupils are equal, round, and reactive to light.     Comments: No nystagmus  Cardiovascular:     Rate and Rhythm: Normal rate.  Pulmonary:     Effort: Pulmonary effort is normal.  Musculoskeletal:     Cervical back: Neck supple.  Skin:    General: Skin is warm.  Neurological:     Mental Status: He is alert and oriented to person, place, and time.     Cranial Nerves: No cranial nerve deficit.     Sensory: No sensory deficit.     Motor: No weakness.     Coordination: Coordination normal.     ED Results / Procedures / Treatments   Labs (all labs ordered are listed, but only abnormal results are displayed) Labs Reviewed  CBC WITH DIFFERENTIAL/PLATELET - Abnormal; Notable for the following components:      Result Value   Lymphs Abs 0.4 (*)    All other components within normal limits  COMPREHENSIVE METABOLIC PANEL - Abnormal; Notable for the following components:   Glucose, Bld 116 (*)    Calcium 8.4 (*)    All other components within normal limits  RESP PANEL BY RT-PCR (RSV, FLU A&B, COVID)  RVPGX2  LIPASE, BLOOD  URINALYSIS, ROUTINE W REFLEX MICROSCOPIC    EKG None  Radiology CT Head  Wo Contrast  Result Date: 09/28/2022 CLINICAL DATA:  Headache, increasing frequency or severity EXAM: CT HEAD WITHOUT CONTRAST TECHNIQUE: Contiguous axial images were obtained from the base of the skull through the vertex without intravenous contrast. RADIATION DOSE REDUCTION: This exam was performed according to the departmental dose-optimization program which includes automated exposure control, adjustment of the mA and/or kV according to patient size and/or use of iterative reconstruction technique. COMPARISON:  08/19/2022 FINDINGS: Brain: No evidence of acute infarction, hemorrhage, hydrocephalus, extra-axial  collection or mass lesion/mass effect. Vascular: No hyperdense vessel or unexpected calcification. Skull: Normal. Negative for fracture or focal lesion. Sinuses/Orbits: No acute finding. IMPRESSION: No acute intracranial process. Electronically Signed   By: Sammie Bench M.D.   On: 09/28/2022 14:46    Procedures Procedures    Medications Ordered in ED Medications  SUMAtriptan (IMITREX) injection 6 mg (has no administration in time range)  prochlorperazine (COMPAZINE) injection 10 mg (10 mg Intravenous Given 09/28/22 1701)  diphenhydrAMINE (BENADRYL) injection 25 mg (25 mg Intravenous Given 09/28/22 1659)  dexamethasone (DECADRON) injection 10 mg (10 mg Intravenous Given 09/28/22 1700)  magnesium sulfate IVPB 2 g 50 mL (2 g Intravenous New Bag/Given 09/28/22 1705)    ED Course/ Medical Decision Making/ A&P                           Medical Decision Making Risk Prescription drug management.   This patient presents to the ED with chief complaint(s) of headaches with pertinent past medical history of reportedly diagnosis of migraine, alcohol use disorder.The complaint involves an extensive differential diagnosis and also carries with it a high risk of complications and morbidity.    The differential diagnosis includes : Primary headaches - including migrainous headaches, cluster  headaches, tension headaches. ICH Carotid dissection Cavernous sinus thrombosis/dural venous thrombosis Tumor Vascular headaches AV malformation Brain aneurysm Muscular headaches  A/P: Pt comes in with cc of headaches. No concerns for life threatening secondary headaches because headaches are not thunderclap, now they are chronic in nature, there is no neurologic symptoms associated with the headaches and patient does not have any high risk features in his personal, social or family history.  The patient already had a CT scan of the brain done.  CT interpreted independently.  There is no evidence of subdural hematoma or intracranial bleed.  Labs are reassuring.  Patient does not have any URI-like symptoms.  Additional history obtained: Records reviewed Care Everywhere/External Records  Independent labs interpretation:  The following labs were independently interpreted: CBC, BMP is reassuring.  Independent visualization and interpretation of imaging: - I independently visualized the following imaging with scope of interpretation limited to determining acute life threatening conditions related to emergency care: CT scan of the brain, which revealed no evidence of brain bleed  Treatment and Reassessment: Patient given Compazine, Benadryl and Decadron for pain. He states that Toradol never helps and does not want it. On reassessment, patient states that the headache is better but not resolved.  He is willing to take Imitrex now.  He is stable for discharge.   We have given him outpatient neurology follow-up.  I have discussed return precautions with the patient as well.   Final Clinical Impression(s) / ED Diagnoses Final diagnoses:  Chronic migraine without aura with status migrainosus, not intractable    Rx / DC Orders ED Discharge Orders          Ordered    Ambulatory referral to Neurology       Comments: An appointment is requested in approximately: 1 week   09/28/22 1800               Varney Biles, MD 09/28/22 Vernelle Emerald

## 2022-09-28 NOTE — Discharge Instructions (Addendum)
We saw you in the ER for headaches. All the labs and imaging are normal. We are not sure what is causing your headaches, however, there appears to be no evidence of infection, bleeds or tumors based on our exam and results.  See your doctor or the neurologist we have referred to if the pain persists, as you might need better medications.

## 2022-10-14 ENCOUNTER — Emergency Department (HOSPITAL_COMMUNITY)
Admission: EM | Admit: 2022-10-14 | Discharge: 2022-10-14 | Disposition: A | Discharge status: Home / Self Care | Payer: Medicaid Other | Attending: Emergency Medicine | Admitting: Emergency Medicine

## 2022-10-14 ENCOUNTER — Emergency Department (HOSPITAL_COMMUNITY): Payer: Medicaid Other

## 2022-10-14 ENCOUNTER — Encounter (HOSPITAL_COMMUNITY): Payer: Self-pay | Admitting: Emergency Medicine

## 2022-10-14 DIAGNOSIS — Z79899 Other long term (current) drug therapy: Secondary | ICD-10-CM | POA: Insufficient documentation

## 2022-10-14 DIAGNOSIS — I10 Essential (primary) hypertension: Secondary | ICD-10-CM | POA: Insufficient documentation

## 2022-10-14 DIAGNOSIS — K429 Umbilical hernia without obstruction or gangrene: Secondary | ICD-10-CM | POA: Insufficient documentation

## 2022-10-14 DIAGNOSIS — K625 Hemorrhage of anus and rectum: Secondary | ICD-10-CM | POA: Insufficient documentation

## 2022-10-14 DIAGNOSIS — R519 Headache, unspecified: Secondary | ICD-10-CM

## 2022-10-14 LAB — CBC WITH DIFFERENTIAL/PLATELET
Abs Immature Granulocytes: 0.01 10*3/uL (ref 0.00–0.07)
Basophils Absolute: 0.1 10*3/uL (ref 0.0–0.1)
Basophils Relative: 1 %
Eosinophils Absolute: 0.3 10*3/uL (ref 0.0–0.5)
Eosinophils Relative: 5 %
HCT: 35.8 % — ABNORMAL LOW (ref 39.0–52.0)
Hemoglobin: 11.9 g/dL — ABNORMAL LOW (ref 13.0–17.0)
Immature Granulocytes: 0 %
Lymphocytes Relative: 27 %
Lymphs Abs: 1.4 10*3/uL (ref 0.7–4.0)
MCH: 31.1 pg (ref 26.0–34.0)
MCHC: 33.2 g/dL (ref 30.0–36.0)
MCV: 93.5 fL (ref 80.0–100.0)
Monocytes Absolute: 0.7 10*3/uL (ref 0.1–1.0)
Monocytes Relative: 14 %
Neutro Abs: 2.8 10*3/uL (ref 1.7–7.7)
Neutrophils Relative %: 53 %
Platelets: 253 10*3/uL (ref 150–400)
RBC: 3.83 MIL/uL — ABNORMAL LOW (ref 4.22–5.81)
RDW: 13.8 % (ref 11.5–15.5)
WBC: 5.2 10*3/uL (ref 4.0–10.5)
nRBC: 0 % (ref 0.0–0.2)

## 2022-10-14 LAB — COMPREHENSIVE METABOLIC PANEL
ALT: 16 U/L (ref 0–44)
AST: 21 U/L (ref 15–41)
Albumin: 3.5 g/dL (ref 3.5–5.0)
Alkaline Phosphatase: 48 U/L (ref 38–126)
Anion gap: 6 (ref 5–15)
BUN: 6 mg/dL (ref 6–20)
CO2: 26 mmol/L (ref 22–32)
Calcium: 8.8 mg/dL — ABNORMAL LOW (ref 8.9–10.3)
Chloride: 106 mmol/L (ref 98–111)
Creatinine, Ser: 0.98 mg/dL (ref 0.61–1.24)
GFR, Estimated: >60 mL/min (ref >60)
Glucose, Bld: 126 mg/dL — ABNORMAL HIGH (ref 70–99)
Potassium: 4 mmol/L (ref 3.5–5.1)
Sodium: 138 mmol/L (ref 135–145)
Total Bilirubin: 0.5 mg/dL (ref 0.3–1.2)
Total Protein: 6.6 g/dL (ref 6.5–8.1)

## 2022-10-14 LAB — URINALYSIS, ROUTINE W REFLEX MICROSCOPIC
Bacteria, UA: NONE SEEN
Bilirubin Urine: NEGATIVE
Glucose, UA: NEGATIVE mg/dL
Hgb urine dipstick: NEGATIVE
Ketones, ur: NEGATIVE mg/dL
Leukocytes,Ua: NEGATIVE
Nitrite: NEGATIVE
Protein, ur: 100 mg/dL — AB
Specific Gravity, Urine: 1.013 (ref 1.005–1.030)
pH: 5 (ref 5.0–8.0)

## 2022-10-14 LAB — HEMOGLOBIN AND HEMATOCRIT, BLOOD
HCT: 37.2 % — ABNORMAL LOW (ref 39.0–52.0)
Hemoglobin: 12.1 g/dL — ABNORMAL LOW (ref 13.0–17.0)

## 2022-10-14 LAB — LIPASE, BLOOD: Lipase: 44 U/L (ref 11–51)

## 2022-10-14 MED ORDER — HYDROCODONE-ACETAMINOPHEN 5-325 MG PO TABS: 1.0000 | ORAL_TABLET | Freq: Four times a day (QID) | ORAL | 0 refills | Status: DC | PRN

## 2022-10-14 MED ORDER — METHOCARBAMOL 500 MG PO TABS
750.0000 mg | ORAL_TABLET | Freq: Once | ORAL | Status: AC
  Administered 2022-10-14: 750 mg via ORAL
  Filled 2022-10-14: qty 2

## 2022-10-14 MED ORDER — PANTOPRAZOLE SODIUM 40 MG PO TBEC: 40.0000 mg | DELAYED_RELEASE_TABLET | Freq: Every day | ORAL | 0 refills | Status: DC

## 2022-10-14 MED ORDER — LACTATED RINGERS IV BOLUS
1000.0000 mL | Freq: Once | INTRAVENOUS | Status: AC
  Administered 2022-10-14: 1000 mL via INTRAVENOUS

## 2022-10-14 MED ORDER — BUTALBITAL-APAP-CAFFEINE 50-325-40 MG PO TABS: 1.0000 | ORAL_TABLET | Freq: Four times a day (QID) | ORAL | 0 refills | Status: AC | PRN

## 2022-10-14 MED ORDER — BUTALBITAL-APAP-CAFFEINE 50-325-40 MG PO TABS: 1.0000 | ORAL_TABLET | Freq: Four times a day (QID) | ORAL | 0 refills | Status: DC | PRN

## 2022-10-14 MED ORDER — METHOCARBAMOL 750 MG PO TABS: 750.0000 mg | ORAL_TABLET | Freq: Three times a day (TID) | ORAL | 0 refills | Status: DC | PRN

## 2022-10-14 MED ORDER — IOHEXOL 350 MG/ML SOLN
100.0000 mL | Freq: Once | INTRAVENOUS | Status: AC | PRN
  Administered 2022-10-14: 100 mL via INTRAVENOUS

## 2022-10-14 MED ORDER — DIPHENHYDRAMINE HCL 50 MG/ML IJ SOLN
12.5000 mg | Freq: Once | INTRAMUSCULAR | Status: AC
  Administered 2022-10-14: 12.5 mg via INTRAVENOUS
  Filled 2022-10-14: qty 1

## 2022-10-14 MED ORDER — PANTOPRAZOLE SODIUM 40 MG IV SOLR
40.0000 mg | Freq: Once | INTRAVENOUS | Status: AC
  Administered 2022-10-14: 40 mg via INTRAVENOUS
  Filled 2022-10-14: qty 10

## 2022-10-14 MED ORDER — ACETAMINOPHEN 500 MG PO TABS
1000.0000 mg | ORAL_TABLET | Freq: Once | ORAL | Status: AC
  Administered 2022-10-14: 1000 mg via ORAL
  Filled 2022-10-14: qty 2

## 2022-10-14 MED ORDER — FAMOTIDINE 20 MG PO TABS: 20.0000 mg | ORAL_TABLET | Freq: Two times a day (BID) | ORAL | 0 refills | Status: DC

## 2022-10-14 MED ORDER — METOCLOPRAMIDE HCL 5 MG/ML IJ SOLN
10.0000 mg | Freq: Once | INTRAMUSCULAR | Status: AC
  Administered 2022-10-14: 10 mg via INTRAVENOUS
  Filled 2022-10-14: qty 2

## 2022-10-14 NOTE — Discharge Instructions (Addendum)
You were seen for a bad headache and rectal bleeding.  Overall your physical exam, imaging and labs are reassuring.  You do not require transfusion of blood.  Continue to take the medicines I prescribed you for for abdominal pain and headaches.  You can call the neurologist listed in your discharge paperwork to discuss your chronic migraines.  Call the [facility] gastroenterology team to be evaluated for your rectal bleeding.  You should avoid drinking any alcohol or taking any ibuprofen or aspirin if you have continued bleeding.   Increase fiber in your diet.    You have a fat hernia in the umbilicus.  This is where a bit of the abdominal fat gets caught in an opening that goes into the bellybutton.  You can review information on this in your discharge instructions.  This is something that can be repaired by a surgery or, sometimes it resolves on its own.  You can take pain medication to help with this.  You have been given a prescription for Fioricet by Dr. Trula Slade.  This is a combination pain medication that is mostly for your migraine headache.  I have also added a prescription for 1 Vicodin tablet to take if needed for additional pain control due to your abdominal hernia.  Try to use as little [place] as possible, this can cause constipation.  Contact information for [facility] is in your discharge instructions.  You may contact them regarding treatment for your umbilical hernia.  Come back if any severe worsening uncontrollable abdominal pain, vomiting blood uncontrollably, increased bleeding, fainting, or any other symptoms concerning to you.

## 2022-10-14 NOTE — ED Provider Notes (Signed)
CT abdomen f/u. H&H stable. Anticipate d/c if CT WNL. Physical Exam  BP (!) 140/88   Pulse 63   Temp 98.1 F (36.7 C)   Resp 18   Ht 5' 10"  (1.778 m)   Wt 113.4 kg   SpO2 98%   BMI 35.87 kg/m   Physical Exam  Procedures  Procedures  ED Course / MDM    Medical Decision Making Amount and/or Complexity of Data Reviewed Labs: ordered. Radiology: ordered.  Risk OTC drugs. Prescription drug management.  CT unremarkable except fat-containing umbilical hernia.  I have reviewed this with the patient.  Reports he had a known umbilical hernia.  He does report it seem more painful over the last week.  I have reexamined this.  There is no guarding there is no severe tenderness there is no abdominal wall changes.  Consult: Reviewed with Dr. Thermon Leyland a general surgery.  He advises with that fat-containing umbilical hernia the patient may be discharged with pain control and follow-up on outpatient basis.  Contact information included in discharge instructions.  I have added a Vicodin tablet to take if needed for the umbilical hernia pain.        Charlesetta Shanks, MD 10/14/22 [phone]

## 2022-10-14 NOTE — ED Provider Triage Note (Signed)
Emergency Medicine Provider Triage Evaluation Note  Jacob Adams , a 56 y.o. male  was evaluated in triage.  Pt complains of migraine.  Notes that his headache feel similar to when he was evaluated in the ED on 612/10/23.  Patient notes that he has had abdominal pain that has been going on as well.  He notes that it feels like a knot to the area.  Has associated diarrhea with blood in the stool.  Denies nausea, vomiting, hematuria.  Review of Systems  Positive: Negative:   Physical Exam  BP (!) 138/97   Pulse 66   Temp 98 F (36.7 C) (Oral)   Resp 18   Ht 5' 10"  (1.778 m)   Wt 113.4 kg   SpO2 98%   BMI 35.87 kg/m  Gen:   Awake, no distress   Resp:  Normal effort  MSK:   Moves extremities without difficulty  Other:  Epigastric abdominal tenderness to palpation  Medical Decision Making  Medically screening exam initiated at 11:05 AM.  Appropriate orders placed.  Jacob Adams was informed that the remainder of the evaluation will be completed by another provider, this initial triage assessment does not replace that evaluation, and the importance of remaining in the ED until their evaluation is complete.     Jacob Adams A, PA-C 10/14/22 1106

## 2022-10-14 NOTE — ED Triage Notes (Signed)
Pt endorses migraine for a month that wont go away. Also having abd pain and feels like a knot. Reports diarrhea and blood in stool.

## 2022-10-14 NOTE — ED Provider Notes (Signed)
[facility] EMERGENCY DEPARTMENT Provider Note   CSN: [id number] Arrival date & time: 10/14/22  1039     History  Chief Complaint  Patient presents with   Migraine   Abdominal Pain    Jacob Adams is a 56 y.o. male.  With PMH of HTN, HLD, alcohol use disorder, history of colitis and internal hemorrhoids presenting with 2 complaints of both ongoing migraine as well as bright red blood per rectum over the past week or so.  Patient says regarding his headaches, it is no different than prior headaches but has just been ongoing.  He feels like it is wrapping around his head more on the right side.  He has had no fevers, no vomiting, no severe onset of headache, no focal weakness numbness or tingling.  He is also complaining of some upper abdominal pain and cramping associated with loose stools mixed with bright red blood per rectum.  He has had something like this before.  He was seen by GI back in July of this year found to have colitis as well as internal hemorrhoids.  At the time he was put on medications and increasing his fiber in his diet but he has not been doing that recently.  He does not use any anticoagulation he denies any recent alcohol use, he denies any NSAID or aspirin use.   Migraine Associated symptoms include abdominal pain.  Abdominal Pain      Home Medications Prior to Admission medications   Medication Sig Start Date End Date Taking? Authorizing Provider  ARIPiprazole (ABILIFY) 10 MG tablet Take 5 mg by mouth daily.    [provider]  atorvastatin (LIPITOR) 80 MG tablet Take 40 mg by mouth at bedtime.     [provider]  carvedilol (COREG) 12.5 MG tablet Take 12.5 mg by mouth 2 (two) times daily with a meal.    [provider]  carvedilol (COREG) 3.125 MG tablet Take 1 tablet (3.125 mg total) by mouth 2 (two) times daily with a meal. Patient not taking: Reported on 05/05/2022 01/27/22   Geradine Girt, DO   chlordiazePOXIDE (LIBRIUM) 25 MG capsule 19m PO TID x 1D, then 25-531mPO BID X 1D, then 25-5018mO QD X 1D 03/25/22   Prosperi, Christian H, PA-C  FLUoxetine (PROZAC) 20 MG capsule Take 20 mg by mouth daily.    [provider]  folic acid (FOLVITE) 1 MG tablet Take 1 mg by mouth daily.    [provider]  hydrocortisone (ANUSOL-HC) 25 MG suppository Place 1 suppository (25 mg total) rectally at bedtime. Nightly for 1 week and then every other night until Rx is complete.  If issues in future, then may use for 3 nights in a row. 05/13/22   Mansouraty, GabTelford NabMD  hydrOXYzine (ATARAX) 10 MG tablet Take 10 mg by mouth 3 (three) times daily as needed for anxiety.    [provider]  lactulose (CHRONULAC) 10 GM/15ML solution Take 45 mLs (30 g total) by mouth 3 (three) times daily. 07/24/20   AdhShelly CossD  lansoprazole (PREVACID) 30 MG capsule Take 1 capsule (30 mg total) by mouth 2 (two) times daily before a meal. 05/14/22   Mansouraty, GabTelford NabMD  latanoprost (XALATAN) 0.005 % ophthalmic solution Place 1 drop into both eyes at bedtime.    [provider]  levothyroxine (SYNTHROID) 88 MCG tablet Take 88 mcg by mouth daily before breakfast.    [provider]  lidocaine (  XYLOCAINE) 5 % ointment Apply 1 application  topically 2 (two) times daily as needed (knee and joint pain).    [provider]  mesalamine (LIALDA) 1.2 g EC tablet Take 4 tablets (4.8 g total) by mouth daily. 04/24/22 05/24/22  Mansouraty, Telford Nab., MD  methocarbamol (ROBAXIN) 500 MG tablet Take 1 tablet (500 mg total) by mouth 2 (two) times daily. 08/19/22   Sherrill Raring, PA-C  Multiple Vitamin (MULTIVITAMIN WITH MINERALS) TABS tablet Take 1 tablet by mouth daily.    [provider]  ondansetron (ZOFRAN) 4 MG tablet Take 1 tablet (4 mg total) by mouth every 6 (six) hours. Patient taking differently: Take 4 mg by mouth every 8 (eight) hours as needed for vomiting or  nausea. 03/25/22   Prosperi, Christian H, PA-C  prazosin (MINIPRESS) 1 MG capsule Take 1 mg by mouth 2 (two) times daily.    [provider]  prazosin (MINIPRESS) 2 MG capsule Take 1 capsule (2 mg total) by mouth at bedtime. Patient not taking: Reported on 05/05/2022 01/27/22   Geradine Girt, DO  rifaximin (XIFAXAN) 550 MG TABS tablet Take 1 tablet (550 mg total) by mouth 2 (two) times daily. 07/24/20   Shelly Coss, MD  thiamine 100 MG tablet Take 1 tablet (100 mg total) by mouth daily. 07/24/20   Shelly Coss, MD      Allergies    Uncoded nonscreenable allergen, Ibuprofen, Nsaids, Penicillins, and [name]    Review of Systems   Review of Systems  Gastrointestinal:  Positive for abdominal pain.    Physical Exam Updated Vital Signs BP (!) 140/88   Pulse 63   Temp 98 F (36.7 C) (Oral)   Resp 18   Ht 5' 10"  (1.778 m)   Wt 113.4 kg   SpO2 98%   BMI 35.87 kg/m  Physical Exam Constitutional: Alert and oriented. Well appearing and in no distress. Eyes: Conjunctivae are normal. ENT      Head: Normocephalic and atraumatic.      Nose: No congestion.      Mouth/Throat: Mucous membranes are moist.      Neck: No stridor. Cardiovascular: S1, S2,  Normal and symmetric distal pulses are present in all extremities.Warm and well perfused. Respiratory: Normal respiratory effort. Breath sounds are normal. Gastrointestinal: Soft and mild distention with epigastrium and mid abdominal tenderness, voluntary guarding, not peritonitic.  Rectal exam deferred as patient currently on hallway and has no appropriate place to perform rectal exam. Musculoskeletal: Normal range of motion in all extremities.      Right lower leg: No tenderness or edema.      Left lower leg: No tenderness or edema. Neurologic: Normal speech and language.  CN II through XII grossly intact.  Equal strength bilateral upper and lower extremities 5 out of 5.  Sensation grossly intact.  Steady ambulatory  gait.  No gross focal neurologic deficits are appreciated. Skin: Skin is warm, dry and intact. No rash noted. Psychiatric: Mood and affect are normal. Speech and behavior are normal.  ED Results / Procedures / Treatments   Labs (all labs ordered are listed, but only abnormal results are displayed) Labs Reviewed  CBC WITH DIFFERENTIAL/PLATELET - Abnormal; Notable for the following components:      Result Value   RBC 3.83 (*)    Hemoglobin 11.9 (*)    HCT 35.8 (*)    All other components within normal limits  COMPREHENSIVE METABOLIC PANEL - Abnormal; Notable for the following components:  Glucose, Bld 126 (*)    Calcium 8.8 (*)    All other components within normal limits  URINALYSIS, ROUTINE W REFLEX MICROSCOPIC - Abnormal; Notable for the following components:   Protein, ur 100 (*)    All other components within normal limits  LIPASE, BLOOD  HEMOGLOBIN AND HEMATOCRIT, BLOOD  POC OCCULT BLOOD, ED    EKG None  Radiology No results found.  Procedures Procedures   Medications Ordered in ED Medications  lactated ringers bolus 1,000 mL (has no administration in time range)  pantoprazole (PROTONIX) injection 40 mg (has no administration in time range)  acetaminophen (TYLENOL) tablet 1,000 mg (has no administration in time range)  metoCLOPramide (REGLAN) injection 10 mg (has no administration in time range)  diphenhydrAMINE (BENADRYL) injection 12.5 mg (has no administration in time range)    ED Course/ Medical Decision Making/ A&P                           Medical Decision Making RUHAN BORAK is a 56 y.o. male.  With PMH of HTN, HLD, alcohol use disorder, history of colitis and internal hemorrhoids presenting with 2 complaints of both ongoing migraine as well as bright red blood per rectum over the past week or so.   Regarding the patient's headache, I suspect it is most consistent with primary headache in nature such as tension or migraine headache. I do not think CVA as  the patient has no focal neurologic deficits. Additionally, I do not think SAH as the patient did not have severe onset of pain at beginning of headache, they do not have focal neurologic deficits, and are overall non-toxic in appearance.  Additionally, patient had head CT without contrast performed 09/28/2022 when he last came in for migraine headache which was unremarkable.  I do not suspect meningitis with no illness symptoms, no rash, nontoxic appearance, and no meningismus on exam.  Regarding bright red blood per rectum, consider most likely internal hemorrhoids as patient has had previous colonoscopy performed in July 2023 which showed internal hemorrhoids as well as colitis.  He had an EGD performed at the time with no evidence of PUD or gastritis.  He is hemodynamically stable and very well-appearing, doubt brisk upper GI bleed.  His initial hemoglobin 11.9 here repeat 12.1.  Doubt active bleeding.  No need for transfusion.  Consistent with patient's baseline hemoglobin.  CTA GI bleed protocol ordered to rule out any active bleed.  If negative, plan for patient to follow-up with [facility] gastro[facility]ology outpatient and referral made to neurology for ongoing chronic migraine management.  Signed out to Dr. Johnney Killian pending CTA GI.  Amount and/or Complexity of Data Reviewed Labs: ordered. Radiology: ordered.  Risk OTC drugs. Prescription drug management.    Final Clinical Impression(s) / ED Diagnoses Final diagnoses:  None    Rx / DC Orders ED Discharge Orders     None         Elgie Congo, MD 10/14/22 1540

## 2022-10-21 ENCOUNTER — Ambulatory Visit (INDEPENDENT_AMBULATORY_CARE_PROVIDER_SITE_OTHER): Payer: Medicaid Other | Admitting: Nurse Practitioner

## 2022-10-21 ENCOUNTER — Encounter: Payer: Self-pay | Admitting: Nurse Practitioner

## 2022-10-21 VITALS — BP 128/90 | HR 64 | Ht 70.0 in | Wt 283.0 lb

## 2022-10-21 DIAGNOSIS — K649 Unspecified hemorrhoids: Secondary | ICD-10-CM | POA: Diagnosis not present

## 2022-10-21 DIAGNOSIS — K625 Hemorrhage of anus and rectum: Secondary | ICD-10-CM | POA: Diagnosis not present

## 2022-10-21 DIAGNOSIS — K219 Gastro-esophageal reflux disease without esophagitis: Secondary | ICD-10-CM

## 2022-10-21 DIAGNOSIS — K7030 Alcoholic cirrhosis of liver without ascites: Secondary | ICD-10-CM | POA: Diagnosis not present

## 2022-10-21 MED ORDER — HYDROCORTISONE (PERIANAL) 2.5 % EX CREA: 1.0000 | TOPICAL_CREAM | Freq: Two times a day (BID) | CUTANEOUS | 1 refills | Status: DC

## 2022-10-21 NOTE — Patient Instructions (Signed)
  Please follow up in 3 months with Dr. Rush Landmark. Give [place] a call at [phone] to schedule an appointment.   Acid Reflux  Below are some measures you can take to possibly improve acid reflux symptoms . We may have discussed some of these today in the office. Not everything on this list may apply to you   --If you are taking anti-reflux ( GERD) medication be sure to take it 30 minutes before breakfast and if taking twice daily then also second dose should be 30 minutes before dinner.   --Avoid late meals / bedtime snacks.   --Avoid trigger foods ( foods which you know tend to aggravate you reflux symptoms). Some common trigger foods include spicy foods, fatty foods, acidic foods, chocolate and caffeine.  --Elevate the head of bed 6-8 inches on blocks or bricks. If not able to elevate the head of the bed consider purchasing a wedge pillow to sleep on.    --Weight reduction / maintain a healthy BMI ( body mass index) may be help with reflux symptoms  --Sometimes with the above mentioned "lifestyle changes" patients are able to reduce the amount of GERD medications they take. Our goal is to have you on the lowest effective dose of medication  Reflux medications.  Pantoprazole 40 mg 30 minutes before breakfast Famotidine 20 mg at bedtime.  Stop Lansoprazole  For hemorrhoids will send in prescription for Anusol cream to use once nightly for 10 nights. Apply inside rectum  For cirrhosis, continue Lactulose and titrate dose to to 3 bowel movements / day. Continue Xifaxan.
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
## 2022-10-21 NOTE — Progress Notes (Deleted)
Jacob Adams D.[name] [address] [place] [address] Phone: [phone]  Assessment and Plan:     There are no diagnoses linked to this encounter.  ***    Date of injury was ***. Original symptom severity scores were *** and ***. The patient was counseled on the nature of the injury, typical course and potential options for further evaluation and treatment. Discussed the importance of compliance with recommendations. Patient stated understanding of this plan and willingness to comply.  Recommendations:  -  Relative mental and physical rest for 48 hours after concussive event - Recommend light aerobic activity while keeping symptoms less than 3/10 - Stop mental or physical activities that cause symptoms to worsen greater than 3/10, and wait 24 hours before attempting them again - Eliminate screen time as much as possible for first 48 hours after concussive event, then continue limited screen time (recommend less than 2 hours per day)   - Encouraged to RTC in *** for reassessment or sooner for any concerns or acute changes   Pertinent previous records reviewed include ***   Time of visit *** minutes, which included chart review, physical exam, treatment plan, symptom severity score, VOMS, and tandem gait testing being performed, interpreted, and discussed with patient at today's visit.   Subjective:   I, Jacob Adams, am serving as a Neurosurgeon for Doctor Richardean Sale  Chief Complaint: concussion symptoms   HPI:   10/22/2022   Concussion HPI:  - Injury date: ***   - Mechanism of injury: ***  - LOC: ***  - Initial evaluation: ***  - Previous head injuries/concussions: ***   - Previous imaging: ***    - Social history: Student at ***, activities include ***   Hospitalization for head injury? No*** Diagnosed/treated for headache disorder, migraines, or seizures? No*** Diagnosed with learning disability Elnita Maxwell? No*** Diagnosed with  ADD/ADHD? No*** Diagnose with Depression, anxiety, or other Psychiatric Disorder? No***   Current medications:  Current Outpatient Medications  Medication Sig Dispense Refill   ARIPiprazole (ABILIFY) 10 MG tablet Take 5 mg by mouth daily.     atorvastatin (LIPITOR) 80 MG tablet Take 40 mg by mouth at bedtime.      butalbital-acetaminophen-caffeine (FIORICET) 50-325-40 MG tablet Take 1-2 tablets by mouth every 6 (six) hours as needed for headache. 20 tablet 0   carvedilol (COREG) 12.5 MG tablet Take 12.5 mg by mouth 2 (two) times daily with a meal.     chlordiazePOXIDE (LIBRIUM) 25 MG capsule 50mg  PO TID x 1D, then 25-50mg  PO BID X 1D, then 25-50mg  PO QD X 1D 10 capsule 0   famotidine (PEPCID) 20 MG tablet Take 1 tablet (20 mg total) by mouth 2 (two) times daily. 30 tablet 0   FLUoxetine (PROZAC) 20 MG capsule Take 20 mg by mouth daily.     folic acid (FOLVITE) 1 MG tablet Take 1 mg by mouth daily.     HYDROcodone-acetaminophen (NORCO/VICODIN) 5-325 MG tablet Take 1 tablet by mouth every 6 (six) hours as needed for moderate pain or severe pain. 12 tablet 0   hydrocortisone (ANUSOL-HC) 2.5 % rectal cream Place 1 Application rectally 2 (two) times daily. 30 g 1   hydrocortisone (ANUSOL-HC) 25 MG suppository Place 1 suppository (25 mg total) rectally at bedtime. Nightly for 1 week and then every other night until Rx is complete.  If issues in future, then may use for 3 nights in a row. 12 suppository 6   hydrOXYzine (ATARAX)  10 MG tablet Take 10 mg by mouth 3 (three) times daily as needed for anxiety.     lactulose (CHRONULAC) 10 GM/15ML solution Take 45 mLs (30 g total) by mouth 3 (three) times daily. 1892 mL 3   latanoprost (XALATAN) 0.005 % ophthalmic solution Place 1 drop into both eyes at bedtime.     levothyroxine (SYNTHROID) 88 MCG tablet Take 88 mcg by mouth daily before breakfast.     lidocaine (XYLOCAINE) 5 % ointment Apply 1 application  topically 2 (two) times daily as needed (knee and  joint pain).     mesalamine (LIALDA) 1.2 g EC tablet Take 4 tablets (4.8 g total) by mouth daily. 120 tablet 6   methocarbamol (ROBAXIN) 500 MG tablet Take 1 tablet (500 mg total) by mouth 2 (two) times daily. 20 tablet 0   methocarbamol (ROBAXIN-750) 750 MG tablet Take 1 tablet (750 mg total) by mouth every 8 (eight) hours as needed for muscle spasms. 30 tablet 0   Multiple Vitamin (MULTIVITAMIN WITH MINERALS) TABS tablet Take 1 tablet by mouth daily.     ondansetron (ZOFRAN) 4 MG tablet Take 1 tablet (4 mg total) by mouth every 6 (six) hours. (Patient taking differently: Take 4 mg by mouth every 8 (eight) hours as needed for vomiting or nausea.) 12 tablet 0   pantoprazole (PROTONIX) 40 MG tablet Take 1 tablet (40 mg total) by mouth daily for 14 days. 14 tablet 0   prazosin (MINIPRESS) 1 MG capsule Take 1 mg by mouth 2 (two) times daily.     prazosin (MINIPRESS) 2 MG capsule Take 1 capsule (2 mg total) by mouth at bedtime.     rifaximin (XIFAXAN) 550 MG TABS tablet Take 1 tablet (550 mg total) by mouth 2 (two) times daily. 60 tablet 1   thiamine 100 MG tablet Take 1 tablet (100 mg total) by mouth daily. 30 tablet 1   No current facility-administered medications for this visit.      Objective:     There were no vitals filed for this visit.    There is no height or weight on file to calculate BMI.    Physical Exam:     General: Well-appearing, cooperative, sitting comfortably in no acute distress.  Psychiatric: Mood and affect are appropriate.   Neuro:sensation intact and strength 5/5 with no deficits, no atrophy, normal muscle tone   Today's Symptom Severity Score:  Scores: 0-6  Headache:*** "Pressure in head":***  Neck Pain:*** Nausea or vomiting:*** Dizziness:*** Blurred vision:*** Balance problems:*** Sensitivity to light:*** Sensitivity to noise:*** Feeling slowed down:*** Feeling like "in a fog":*** "Don't feel right":*** Difficulty concentrating:*** Difficulty  remembering:***  Fatigue or low energy:*** Confusion:***  Drowsiness:***  More emotional:*** Irritability:*** Sadness:***  Nervous or Anxious:*** Trouble falling or staying asleep:***  Total number of symptoms: ***/22  Symptom Severity index: ***/132  Worse with physical activity? No*** Worse with mental activity? No*** Percent improved since injury: ***%    Full pain-free cervical PROM: yes***    Cognitive:  - Months backwards: *** Mistakes. *** seconds  mVOMS:   - Baseline symptoms: *** - Horizontal Vestibular-Ocular Reflex: ***/10  - Smooth pursuits: ***/10  - Horizontal Saccades:  ***/10  - Visual Motion Sensitivity Test:  ***/10  - Convergence: ***cm (<5 cm normal)    Autonomic:  - Symptomatic with supine to standing: No***  Complex Tandem Gait: - Forward, eyes open: *** errors - Backward, eyes open: *** errors - Forward, eyes closed: *** errors - Backward, eyes closed: ***  errors  Electronically signed by:  Benito Mccreedy D.[facility] 1:32 PM 10/21/22

## 2022-10-22 ENCOUNTER — Encounter: Payer: No Typology Code available for payment source | Admitting: Sports Medicine

## 2022-10-22 NOTE — Progress Notes (Signed)
Attending Physician's Attestation   I have reviewed the chart.   I agree with the Advanced Practitioner's note, impression, and recommendations with any updates as below. Hopefully patient will have further improvement with utilization of Anusol.  If not, higher risk hemorrhoidal banding may be considered though without active proctitis (quiescent/inactive on recent biopsies) I think there may be a role for [place] to consider this in future.   Justice Britain, MD [facility] Office # [phone]

## 2022-11-06 ENCOUNTER — Encounter: Payer: Self-pay | Admitting: Psychiatry

## 2022-11-06 ENCOUNTER — Ambulatory Visit (INDEPENDENT_AMBULATORY_CARE_PROVIDER_SITE_OTHER): Payer: Medicaid Other | Admitting: Psychiatry

## 2022-11-06 VITALS — BP 151/91 | HR 73 | Ht 70.0 in | Wt 296.8 lb

## 2022-11-06 DIAGNOSIS — G43719 Chronic migraine without aura, intractable, without status migrainosus: Secondary | ICD-10-CM | POA: Diagnosis not present

## 2022-11-06 MED ORDER — EMGALITY 120 MG/ML ~~LOC~~ SOAJ: 2.0000 | Freq: Once | SUBCUTANEOUS | 0 refills | Status: AC

## 2022-11-06 MED ORDER — UBRELVY 50 MG PO TABS: 50.0000 mg | ORAL_TABLET | ORAL | 6 refills | Status: DC | PRN

## 2022-11-06 MED ORDER — EMGALITY 120 MG/ML ~~LOC~~ SOAJ: 1.0000 | SUBCUTANEOUS | 6 refills | Status: DC
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
## 2022-11-10 ENCOUNTER — Other Ambulatory Visit: Payer: Self-pay

## 2022-11-10 ENCOUNTER — Emergency Department (HOSPITAL_COMMUNITY): Payer: No Typology Code available for payment source

## 2022-11-10 ENCOUNTER — Emergency Department (HOSPITAL_COMMUNITY)
Admission: EM | Admit: 2022-11-10 | Discharge: 2022-11-10 | Disposition: A | Discharge status: Home / Self Care | Payer: No Typology Code available for payment source | Attending: Emergency Medicine | Admitting: Emergency Medicine

## 2022-11-10 DIAGNOSIS — I10 Essential (primary) hypertension: Secondary | ICD-10-CM | POA: Diagnosis not present

## 2022-11-10 DIAGNOSIS — G43901 Migraine, unspecified, not intractable, with status migrainosus: Secondary | ICD-10-CM

## 2022-11-10 DIAGNOSIS — Z79899 Other long term (current) drug therapy: Secondary | ICD-10-CM | POA: Insufficient documentation

## 2022-11-10 DIAGNOSIS — R519 Headache, unspecified: Secondary | ICD-10-CM | POA: Diagnosis present

## 2022-11-10 DIAGNOSIS — I509 Heart failure, unspecified: Secondary | ICD-10-CM | POA: Insufficient documentation

## 2022-11-10 LAB — URINALYSIS, ROUTINE W REFLEX MICROSCOPIC
Bacteria, UA: NONE SEEN
Bilirubin Urine: NEGATIVE
Glucose, UA: NEGATIVE mg/dL
Hgb urine dipstick: NEGATIVE
Ketones, ur: NEGATIVE mg/dL
Leukocytes,Ua: NEGATIVE
Nitrite: NEGATIVE
Protein, ur: 100 mg/dL — AB
Specific Gravity, Urine: 1.02 (ref 1.005–1.030)
pH: 6 (ref 5.0–8.0)

## 2022-11-10 LAB — COMPREHENSIVE METABOLIC PANEL
ALT: 18 U/L (ref 0–44)
AST: 18 U/L (ref 15–41)
Albumin: 4.3 g/dL (ref 3.5–5.0)
Alkaline Phosphatase: 52 U/L (ref 38–126)
Anion gap: 6 (ref 5–15)
BUN: 15 mg/dL (ref 6–20)
CO2: 29 mmol/L (ref 22–32)
Calcium: 8.8 mg/dL — ABNORMAL LOW (ref 8.9–10.3)
Chloride: 104 mmol/L (ref 98–111)
Creatinine, Ser: 0.94 mg/dL (ref 0.61–1.24)
GFR, Estimated: >60 mL/min (ref >60)
Glucose, Bld: 104 mg/dL — ABNORMAL HIGH (ref 70–99)
Potassium: 3.6 mmol/L (ref 3.5–5.1)
Sodium: 139 mmol/L (ref 135–145)
Total Bilirubin: 0.5 mg/dL (ref 0.3–1.2)
Total Protein: 7.5 g/dL (ref 6.5–8.1)

## 2022-11-10 LAB — CBC WITH DIFFERENTIAL/PLATELET
Abs Immature Granulocytes: 0.02 10*3/uL (ref 0.00–0.07)
Basophils Absolute: 0.1 10*3/uL (ref 0.0–0.1)
Basophils Relative: 1 %
Eosinophils Absolute: 0.2 10*3/uL (ref 0.0–0.5)
Eosinophils Relative: 4 %
HCT: 40.3 % (ref 39.0–52.0)
Hemoglobin: 12.9 g/dL — ABNORMAL LOW (ref 13.0–17.0)
Immature Granulocytes: 0 %
Lymphocytes Relative: 27 %
Lymphs Abs: 1.6 10*3/uL (ref 0.7–4.0)
MCH: 29.5 pg (ref 26.0–34.0)
MCHC: 32 g/dL (ref 30.0–36.0)
MCV: 92 fL (ref 80.0–100.0)
Monocytes Absolute: 0.7 10*3/uL (ref 0.1–1.0)
Monocytes Relative: 12 %
Neutro Abs: 3.3 10*3/uL (ref 1.7–7.7)
Neutrophils Relative %: 56 %
Platelets: 285 10*3/uL (ref 150–400)
RBC: 4.38 MIL/uL (ref 4.22–5.81)
RDW: 14.2 % (ref 11.5–15.5)
WBC: 5.9 10*3/uL (ref 4.0–10.5)
nRBC: 0 % (ref 0.0–0.2)

## 2022-11-10 LAB — BRAIN NATRIURETIC PEPTIDE: B Natriuretic Peptide: 236.8 pg/mL — ABNORMAL HIGH (ref 0.0–100.0)

## 2022-11-10 LAB — TROPONIN I (HIGH SENSITIVITY): Troponin I (High Sensitivity): 13 ng/L (ref <18)

## 2022-11-10 MED ORDER — KETOROLAC TROMETHAMINE 15 MG/ML IJ SOLN
7.5000 mg | Freq: Once | INTRAMUSCULAR | Status: AC
  Administered 2022-11-10: 7.5 mg via INTRAVENOUS
  Filled 2022-11-10: qty 1

## 2022-11-10 MED ORDER — PROCHLORPERAZINE EDISYLATE 10 MG/2ML IJ SOLN
10.0000 mg | Freq: Once | INTRAMUSCULAR | Status: AC
  Administered 2022-11-10: 10 mg via INTRAVENOUS
  Filled 2022-11-10: qty 2

## 2022-11-10 MED ORDER — DIPHENHYDRAMINE HCL 50 MG/ML IJ SOLN
12.5000 mg | Freq: Once | INTRAMUSCULAR | Status: AC
  Administered 2022-11-10: 12.5 mg via INTRAVENOUS
  Filled 2022-11-10: qty 1

## 2022-11-10 NOTE — ED Provider Notes (Signed)
[place] EMERGENCY DEPARTMENT AT [facility] Provider Note   CSN: [id number] Arrival date & time: 11/10/22  1317     History  Chief Complaint  Patient presents with   Migraine    Jacob Adams is a 57 y.o. male.   Migraine  Patient presents with headache.  Has had daily headaches for the past 3 months.  Has seen neurology as recently as 4 days ago.  Reviewed neurology note.  Diagnosed with chronic migraines.  Reportedly started on [place] and [employer]  Continued headaches.  Diffuse over the whole head.  Throbbing.  Nausea.  States he really does not get any break of the headaches.  Has been worse over the last few months. Also history of CHF.  States he takes his blood pressure medicines.  States he feels that his weight is up.  Chest pain at times.    Past Medical History:  Diagnosis Date   Acid reflux    Alcohol withdrawal (HCC) 01/2019   Anxiety    Arthritis    "toes" (07/26/2014)   Bipolar disorder (HCC)    Depression    Headache(784.0)    "weekly" (07/26/2014)   History of blood transfusion ~ 2000   "related to nose bleeding"   History of stomach ulcers    Hypertension    Lower GI bleeding admitted 07/26/2014   Mental disorder    Migraine    "@ least monthly" (07/26/2014)   Pancreatitis    Rectal bleeding 07/26/2014   Sleep apnea    "haven't been RX'd mask yet" (07/26/2014)    Home Medications Prior to Admission medications   Medication Sig Start Date End Date Taking? Authorizing Provider  amitriptyline (ELAVIL) 50 MG tablet Take 50 mg by mouth at bedtime. 10/23/22   [provider]  ARIPiprazole (ABILIFY) 10 MG tablet Take 5 mg by mouth daily.    [provider]  atorvastatin (LIPITOR) 80 MG tablet Take 40 mg by mouth at bedtime.     [provider]  butalbital-acetaminophen-caffeine (FIORICET) [phone] MG tablet Take 1-2 tablets by mouth every 6 (six) hours as needed for headache. 10/14/22 10/14/23  Arby Barrette, MD   carvedilol (COREG) 12.5 MG tablet Take 12.5 mg by mouth 2 (two) times daily with a meal.    [provider]  chlordiazePOXIDE (LIBRIUM) 25 MG capsule 50mg  PO TID x 1D, then 25-50mg  PO BID X 1D, then 25-50mg  PO QD X 1D 03/25/22   Prosperi, Christian H, PA-C  famotidine (PEPCID) 20 MG tablet Take 1 tablet (20 mg total) by mouth 2 (two) times daily. 10/14/22   10/16/22, MD  famotidine (PEPCID) 20 MG tablet Take 1 tablet by mouth 2 (two) times daily. 10/14/22   [provider]  FLUoxetine (PROZAC) 20 MG capsule Take 20 mg by mouth daily.    [provider]  folic acid (FOLVITE) 1 MG tablet Take 1 mg by mouth daily.    [provider]  Galcanezumab-gnlm (EMGALITY) 120 MG/ML SOAJ Inject 1 Pen into the skin every 30 (thirty) days. 11/06/22   11/08/22, MD  HYDROcodone-acetaminophen (NORCO/VICODIN) 5-325 MG tablet Take 1 tablet by mouth every 6 (six) hours as needed for moderate pain or severe pain. 10/14/22   10/16/22, MD  hydrocortisone (ANUSOL-HC) 2.5 % rectal cream Place 1 Application rectally 2 (two) times daily. 10/21/22   12/20/22, NP  hydrocortisone (ANUSOL-HC) 25 MG suppository Place 1 suppository (25 mg total) rectally at bedtime. Nightly for 1  week and then every other night until Rx is complete.  If issues in future, then may use for 3 nights in a row. 05/13/22   Mansouraty, Telford Nab., MD  hydrOXYzine (ATARAX) 10 MG tablet Take 10 mg by mouth 3 (three) times daily as needed for anxiety.    [provider]  lactulose (CHRONULAC) 10 GM/15ML solution Take 45 mLs (30 g total) by mouth 3 (three) times daily. 07/24/20   Shelly Coss, MD  latanoprost (XALATAN) 0.005 % ophthalmic solution Place 1 drop into both eyes at bedtime.    [provider]  levothyroxine (SYNTHROID) 88 MCG tablet Take 88 mcg by mouth daily before breakfast.    [provider]  lidocaine (XYLOCAINE) 5 % ointment Apply 1 application  topically  2 (two) times daily as needed (knee and joint pain).    [provider]  mesalamine (LIALDA) 1.2 g EC tablet Take 4 tablets (4.8 g total) by mouth daily. 04/24/22 05/24/22  Mansouraty, Telford Nab., MD  methocarbamol (ROBAXIN) 500 MG tablet Take 1 tablet (500 mg total) by mouth 2 (two) times daily. 08/19/22   Sherrill Raring, PA-C  methocarbamol (ROBAXIN-750) 750 MG tablet Take 1 tablet (750 mg total) by mouth every 8 (eight) hours as needed for muscle spasms. 10/14/22   Charlesetta Shanks, MD  mirtazapine (REMERON) 7.5 MG tablet Take 7.5 mg by mouth at bedtime.    [provider]  Multiple Vitamin (MULTIVITAMIN WITH MINERALS) TABS tablet Take 1 tablet by mouth daily.    [provider]  naltrexone (DEPADE) 50 MG tablet Take 50 mg by mouth daily. 07/08/22   [provider]  ondansetron (ZOFRAN) 4 MG tablet Take 1 tablet (4 mg total) by mouth every 6 (six) hours. Patient taking differently: Take 4 mg by mouth every 8 (eight) hours as needed for vomiting or nausea. 03/25/22   Prosperi, Christian H, PA-C  pantoprazole (PROTONIX) 40 MG tablet Take 1 tablet (40 mg total) by mouth daily for 14 days. 10/14/22 10/28/22  Charlesetta Shanks, MD  pantoprazole (PROTONIX) 40 MG tablet Take 40 mg by mouth daily. 10/23/22 01/21/23  [provider]  prazosin (MINIPRESS) 1 MG capsule Take 1 mg by mouth 2 (two) times daily.    [provider]  prazosin (MINIPRESS) 2 MG capsule Take 1 capsule (2 mg total) by mouth at bedtime. 01/27/22   Geradine Girt, DO  prazosin (MINIPRESS) 2 MG capsule Take 2 mg by mouth 2 (two) times daily. 10/23/22 01/21/23  [provider]  propranolol (INDERAL) 60 MG tablet Take 60 mg by mouth 2 (two) times daily. 08/11/22   [provider]  rifaximin (XIFAXAN) 550 MG TABS tablet Take 1 tablet (550 mg total) by mouth 2 (two) times daily. 07/24/20   Shelly Coss, MD  thiamine 100 MG tablet Take 1 tablet (100 mg total) by mouth daily. 07/24/20    Shelly Coss, MD  Ubrogepant (UBRELVY) 50 MG TABS Take 50 mg by mouth as needed. 11/06/22   Genia Harold, MD      Allergies    Uncoded nonscreenable allergen, Nsaids, Ibuprofen, Pork (diagnostic), Penicillins, and [name]    Review of Systems   Review of Systems  Physical Exam Updated Vital Signs BP (!) 163/97   Pulse 67   Temp 98.1 F (36.7 C)   Resp 17   SpO2 96%  Physical Exam Vitals and nursing note reviewed.  Neurological:     Mental Status: He is alert.  ED Results / Procedures / Treatments   Labs (all labs ordered are listed, but only abnormal results are displayed) Labs Reviewed  CBC WITH DIFFERENTIAL/PLATELET - Abnormal; Notable for the following components:      Result Value   Hemoglobin 12.9 (*)    All other components within normal limits  COMPREHENSIVE METABOLIC PANEL - Abnormal; Notable for the following components:   Glucose, Bld 104 (*)    Calcium 8.8 (*)    All other components within normal limits  URINALYSIS, ROUTINE W REFLEX MICROSCOPIC - Abnormal; Notable for the following components:   Protein, ur 100 (*)    All other components within normal limits  BRAIN NATRIURETIC PEPTIDE - Abnormal; Notable for the following components:   B Natriuretic Peptide 236.8 (*)    All other components within normal limits  TROPONIN I (HIGH SENSITIVITY)  TROPONIN I (HIGH SENSITIVITY)    EKG EKG Interpretation  Date/Time:  Monday November 10 2022 15:17:05 EST Ventricular Rate:  70 PR Interval:  205 QRS Duration: 94 QT Interval:  412 QTC Calculation: 445 R Axis:   -25 Text Interpretation: Sinus rhythm Borderline prolonged PR interval Borderline left axis deviation Confirmed by Davonna Belling [id number]) on 11/10/2022 4:51:40 PM  Radiology DG Chest 2 View  Result Date: 11/10/2022 CLINICAL DATA:  Shortness of breath, headache, blurred vision EXAM: CHEST - 2 VIEW COMPARISON:  08/19/2022 FINDINGS: Enlargement of cardiac silhouette with  pulmonary vascular congestion. Mediastinal contours normal. Decreased subsegmental atelectasis in lingula versus previous exam. Lungs otherwise clear. No pulmonary infiltrate, pleural effusion, or pneumothorax. Osseous structures unremarkable. IMPRESSION: No acute abnormalities. Electronically Signed   By: Lavonia Dana M.D.   On: 11/10/2022 15:01    Procedures Procedures    Medications Ordered in ED Medications  ketorolac (TORADOL) 15 MG/ML injection 7.5 mg (7.5 mg Intravenous Given 11/10/22 1658)  prochlorperazine (COMPAZINE) injection 10 mg (10 mg Intravenous Given 11/10/22 1659)  diphenhydrAMINE (BENADRYL) injection 12.5 mg (12.5 mg Intravenous Given 11/10/22 1655)    ED Course/ Medical Decision Making/ A&P                             Medical Decision Making Risk Prescription drug management.   Patient with headache.  History of same.  Has seen by neurology recently.  Diagnosed with chronic migraines.  Continued pain.  Has had CT scan about a month ago.  Mild hypertension.  Typical headache but lasting longer.  No focal neurodeficits.  Will treat with migraine cocktail.  Do not think we need new imaging.  Reviewed neurology note.  Feels better after treatment and will discharge home with outpatient follow-up. Also had some chest pain and shortness of breath at times.  EKG reassuring.  Blood work reassuring.  Does have some hypertension but potentially also could be related to headache.  Can follow-up as an outpatient.  Will discharge home.        Final Clinical Impression(s) / ED Diagnoses Final diagnoses:  Migraine with status migrainosus, not intractable, unspecified migraine type    Rx / DC Orders ED Discharge Orders     None         Davonna Belling, MD 11/10/22 2025

## 2022-11-10 NOTE — ED Triage Notes (Signed)
Pt reports migraine x1 month wit blurry vision, nausea and sob with activity.  Pt reports hx migraine and took meds w/o relief

## 2022-11-10 NOTE — ED Provider Triage Note (Signed)
Emergency Medicine Provider Triage Evaluation Note  Jacob Adams , a 57 y.o. male  was evaluated in triage.  Pt complains of headache with blurry vision as well as some shortness of breath.  He reports the headache with blurry vision has been going on for at least the past 3 months.  He reports that she has been worsening over time.  He is already seen a neurologist and took the medication that he does not remove the name of which did not help.  He denies any trouble walking or trouble talking but does mention whenever he is walking has been feeling more short of breath with as been going on for the past 3 to 4 weeks.  He is also followed up with his primary care doctor about this as well who is scheduled him for an echocardiogram.  He reports he feels more short of breath on exertion.  No chest pain.  He reports some photo and phonophobia however the patient is looking at his phone with the lights with headphones on.  Review of Systems  Positive:  Negative:   Physical Exam  BP (!) 173/101   Pulse 70   Temp 98.1 F (36.7 C)   Resp 20   SpO2 97%  Gen:   Awake, no distress   Resp:  Normal effort  MSK:   Moves extremities without difficulty  Other:  Cranial nerves grossly intact.  No pronator drift.  Answering questions appropriately with appropriate speech.  Medical Decision Making  Medically screening exam initiated at 2:35 PM.  Appropriate orders placed.  [name] was informed that the remainder of the evaluation will be completed by another provider, this initial triage assessment does not replace that evaluation, and the importance of remaining in the ED until their evaluation is complete.  Given persistent headache, CT ordered as well as chest pain labs.   Sherrell Puller, [place] 11/10/22 1437

## 2022-11-17 ENCOUNTER — Ambulatory Visit: Payer: No Typology Code available for payment source | Admitting: Orthopaedic Surgery

## 2022-11-19 ENCOUNTER — Other Ambulatory Visit (HOSPITAL_COMMUNITY): Payer: Self-pay

## 2022-11-20 ENCOUNTER — Inpatient Hospital Stay: Payer: Medicaid Other | Admitting: Psychiatry

## 2022-11-24 ENCOUNTER — Other Ambulatory Visit (HOSPITAL_COMMUNITY): Payer: Self-pay

## 2022-11-26 ENCOUNTER — Telehealth: Payer: Self-pay | Admitting: Pharmacy Technician

## 2022-11-26 ENCOUNTER — Encounter: Payer: Self-pay | Admitting: Orthopaedic Surgery

## 2022-11-26 ENCOUNTER — Ambulatory Visit (INDEPENDENT_AMBULATORY_CARE_PROVIDER_SITE_OTHER): Payer: Medicaid Other | Admitting: Orthopaedic Surgery

## 2022-11-26 DIAGNOSIS — M25561 Pain in right knee: Secondary | ICD-10-CM

## 2022-11-26 MED ORDER — METHYLPREDNISOLONE ACETATE 40 MG/ML IJ SUSP
40.0000 mg | INTRAMUSCULAR | Status: AC | PRN
  Administered 2022-11-26: 40 mg via INTRA_ARTICULAR

## 2022-11-26 MED ORDER — LIDOCAINE HCL 1 % IJ SOLN
3.0000 mL | INTRAMUSCULAR | Status: AC | PRN
  Administered 2022-11-26: 3 mL
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
## 2022-11-26 NOTE — Telephone Encounter (Signed)
Patient Advocate Encounter   Received notification that prior authorization for Ubrelvy 50MG  tablets is required.   PA submitted on 11/26/2022 Key [id number] Status is pending       Lyndel Safe, [place] Patient Advocate Specialist [place] Patient Advocate Team Direct Number: [phone]  Fax: [phone]

## 2022-11-27 NOTE — Telephone Encounter (Signed)
Patient Advocate Encounter  Prior Authorization for Jacob Adams 50MG  tablets has been approved.    PA# [id number] Effective dates: 11/26/2022 through 11/27/2023      Lyndel Safe, [place] Patient Advocate Specialist [place] Patient Advocate Team Direct Number: [phone]  Fax: [phone]

## 2022-12-16 ENCOUNTER — Encounter: Payer: Self-pay | Admitting: Gastroenterology

## 2023-01-06 ENCOUNTER — Telehealth: Payer: Self-pay | Admitting: Pharmacy Technician

## 2023-01-06 NOTE — Telephone Encounter (Signed)
Patient Advocate Encounter   Received notification that prior authorization for Emgality 120MG /ML auto-injectors (migraine) is required.   PA submitted on 01/06/2023 Key [id number] Insurance [employer] Electronic Prior Authorization Form Status is pending       Lyndel Safe, [place] Patient Advocate Specialist [place] Patient Advocate Team Direct Number: [phone]  Fax: [phone]

## 2023-01-07 ENCOUNTER — Other Ambulatory Visit (HOSPITAL_COMMUNITY): Payer: Self-pay

## 2023-01-07 NOTE — Telephone Encounter (Signed)
Pharmacy Patient Advocate Encounter  Prior Authorization for Emgality 120MG /ML auto-injectors (migraine) has been approved by [employer] (ins).    PA # [id number] Effective dates: 01/07/2023 through 04/08/2023

## 2023-01-08 ENCOUNTER — Other Ambulatory Visit: Payer: Self-pay | Admitting: Internal Medicine

## 2023-01-08 ENCOUNTER — Ambulatory Visit
Admission: RE | Admit: 2023-01-08 | Discharge: 2023-01-08 | Disposition: A | Discharge status: Home / Self Care | Payer: No Typology Code available for payment source | Source: Ambulatory Visit | Attending: Internal Medicine | Admitting: Internal Medicine

## 2023-01-08 DIAGNOSIS — I10 Essential (primary) hypertension: Secondary | ICD-10-CM

## 2023-01-19 ENCOUNTER — Encounter: Payer: Self-pay | Admitting: Orthopaedic Surgery

## 2023-01-19 ENCOUNTER — Ambulatory Visit (INDEPENDENT_AMBULATORY_CARE_PROVIDER_SITE_OTHER): Payer: Medicaid Other | Admitting: Orthopaedic Surgery

## 2023-01-19 DIAGNOSIS — M25561 Pain in right knee: Secondary | ICD-10-CM | POA: Diagnosis not present
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
## 2023-01-29 ENCOUNTER — Encounter: Payer: Self-pay | Admitting: Orthopaedic Surgery

## 2023-03-04 ENCOUNTER — Other Ambulatory Visit (HOSPITAL_COMMUNITY): Payer: Self-pay

## 2023-03-04 ENCOUNTER — Telehealth: Payer: Self-pay

## 2023-03-04 NOTE — Telephone Encounter (Signed)
Received a PA form from [place] [employer]-faxed completed form along with clinicals to [phone].

## 2023-03-13 ENCOUNTER — Telehealth: Payer: Self-pay

## 2023-03-13 ENCOUNTER — Other Ambulatory Visit (HOSPITAL_COMMUNITY): Payer: Self-pay

## 2023-03-13 NOTE — Telephone Encounter (Signed)
Pharmacy Patient Advocate Encounter  Prior Authorization for Jacob Adams [facility] has been approved by [facility] Medicaid (ins).    PA # [id number] Effective dates: 03/13/2023 through 03/12/2024  PA was submitted on [employer]

## 2023-03-13 NOTE — Telephone Encounter (Signed)
Pharmacy Patient Advocate Encounter   Received notification from [facility] that prior authorization for Ubrelvy 50MG  Tablets is required/requested.   PA submitted on 03/13/2023 to (ins) [place] MEDICAID via CoverMyMeds Key or [facility]) confirmation # [id number] W Status is pending  PA submitted via [place] [employer] portal

## 2023-03-27 NOTE — Telephone Encounter (Signed)
PA wad denied-no reason provided per [employer] website-will outreach insurance to follow up.

## 2023-04-14 ENCOUNTER — Telehealth: Payer: Self-pay | Admitting: Psychiatry

## 2023-04-14 NOTE — Telephone Encounter (Signed)
Pharmacy Patient Advocate Encounter   Received notification from [facility] that prior authorization for Jacob Adams is required/requested.   PA submitted to [facility] MEDICAID via CoverMyMeds Key or [facility]) confirmation # [id number] W Status is pending

## 2023-04-14 NOTE — Telephone Encounter (Signed)
Monica, do you have any updates on this?

## 2023-04-14 NOTE — Telephone Encounter (Signed)
Pharmacy Patient Advocate Encounter  Received notification from [employer] that the request for prior authorization for Bernita Raisin has been denied due to PT must have tried and failed "TWO" of the preferred Triptans or have a contraindication. Per chart notes PT has only tried Sumatriptan. The preferred Triptans are Sumatriptan and Rizatriptan.  Please be advised we currently do not have a Pharmacist to review denials, therefore you will need to process appeals accordingly as needed. Thanks for your support at this time.   You may  or fax [phone], to appeal.  They state they only mail a letter of denial to the patient and the practice via mail. They will not fax the denial letter.

## 2023-04-14 NOTE — Telephone Encounter (Signed)
Jacob Adams, I reivewed Dr. Quentin Mulling last OV note. She has it documented: "Triptans are contraindicated with uncontrolled HTN. Will start Ubrelvy for migraine rescue"  Can you resubmit PA?

## 2023-04-14 NOTE — Telephone Encounter (Signed)
Phone room: please call back and let her know PA denied originally but we are trying to overturn that to get [place] approved.

## 2023-04-14 NOTE — Telephone Encounter (Signed)
Pt wife is asking for the status of the PA on pt's Ubrelvy

## 2023-04-14 NOTE — Telephone Encounter (Signed)
The pt wife called today wanting update (see other phone note). Can you call her?

## 2023-04-15 NOTE — Telephone Encounter (Signed)
Please call wife back. We just spoke with her yesterday to let her know we are trying to resubmit Jacob Adams since denied. Our auth team is currently working on this. As soon as we hear something back from them, we will let them know an update.

## 2023-04-15 NOTE — Telephone Encounter (Signed)
[name], Guiseppe Flanagan checking prior authorization for Ubrogepant (UBRELVY) 50 MG TABS. Would like a call back.

## 2023-04-16 NOTE — Telephone Encounter (Signed)
Called pt wife. Informed her of the message nurse [facility] sent. Pt wife said thank you so much for the update.

## 2023-04-20 NOTE — Telephone Encounter (Signed)
Second PA attempt was also denied.

## 2023-04-21 NOTE — Telephone Encounter (Addendum)
LMVM for pt that received 2nd denial for the ubrelvy. They may have received letter relating to this.   I will appeal.  Just to let them know.

## 2023-05-04 ENCOUNTER — Encounter: Payer: Self-pay | Admitting: *Deleted

## 2023-05-05 NOTE — Telephone Encounter (Signed)
Appeal letter written for Jacob Adams (signed by Dr. Frances Furbish for Dr. Delena Bali is out of office).  Fax confirmation received [phone].  Confirmation # [id number] W. Determination pending.

## 2023-05-19 ENCOUNTER — Ambulatory Visit (INDEPENDENT_AMBULATORY_CARE_PROVIDER_SITE_OTHER): Payer: MEDICAID

## 2023-05-19 ENCOUNTER — Other Ambulatory Visit: Payer: Self-pay | Admitting: Podiatry

## 2023-05-19 ENCOUNTER — Ambulatory Visit (INDEPENDENT_AMBULATORY_CARE_PROVIDER_SITE_OTHER): Payer: MEDICAID | Admitting: Podiatry

## 2023-05-19 ENCOUNTER — Ambulatory Visit: Payer: MEDICAID | Admitting: Podiatry

## 2023-05-19 DIAGNOSIS — B351 Tinea unguium: Secondary | ICD-10-CM

## 2023-05-19 DIAGNOSIS — M2021 Hallux rigidus, right foot: Secondary | ICD-10-CM

## 2023-05-19 DIAGNOSIS — M722 Plantar fascial fibromatosis: Secondary | ICD-10-CM

## 2023-05-19 DIAGNOSIS — L853 Xerosis cutis: Secondary | ICD-10-CM | POA: Diagnosis not present

## 2023-05-19 DIAGNOSIS — M2022 Hallux rigidus, left foot: Secondary | ICD-10-CM

## 2023-05-19 MED ORDER — AMMONIUM LACTATE 12 % EX CREA: 1.0000 | TOPICAL_CREAM | CUTANEOUS | 2 refills | Status: DC | PRN

## 2023-05-19 MED ORDER — CICLOPIROX 8 % EX SOLN: Freq: Every day | CUTANEOUS | 2 refills | Status: DC

## 2023-05-19 NOTE — Patient Instructions (Signed)
For inserts I like POWERSTEPS, SUPERFEET, AETREX  ----  While at your visit today you received a steroid injection in your foot or ankle to help with your pain. Along with having the steroid medication there is some "numbing" medication in the shot that you received. Due to this you may notice some numbness to the area for the next couple of hours.   I would recommend limiting activity for the next few days to help the steroid injection take affect.    The actually benefit from the steroid injection may take up to 2-7 days to see a difference. You may actually experience a small (as in 10%) INCREASE in pain in the first 24 hours---that is common. It would be best if you can ice the area today and take anti-inflammatory medications (such as Ibuprofen, Motrin, or Aleve) if you are able to take these medications. If you were prescribed another medication to help with the pain go ahead and start that medication today    Things to watch out for that you should contact us or a health care provider urgently would include: 1. Unusual (as in more than 10%) increase in pain 2. New fever > 101.5 3. New swelling or redness of the injected area.  4. Streaking of red lines around the area injected.  If you have any questions or concerns about this, please give our office a call at [phone].    --  Plantar Fasciitis (Heel Spur Syndrome) with Rehab The plantar fascia is a fibrous, ligament-like, soft-tissue structure that spans the bottom of the foot. Plantar fasciitis is a condition that causes pain in the foot due to inflammation of the tissue. SYMPTOMS  Pain and tenderness on the underneath side of the foot. Pain that worsens with standing or walking. CAUSES  Plantar fasciitis is caused by irritation and injury to the plantar fascia on the underneath side of the foot. Common mechanisms of injury include: Direct trauma to bottom of the foot. Damage to a small nerve that runs under the foot where  the main fascia attaches to the heel bone. Stress placed on the plantar fascia due to bone spurs. RISK INCREASES WITH:  Activities that place stress on the plantar fascia (running, jumping, pivoting, or cutting). Poor strength and flexibility. Improperly fitted shoes. Tight calf muscles. Flat feet. Failure to warm-up properly before activity. Obesity. PREVENTION Warm up and stretch properly before activity. Allow for adequate recovery between workouts. Maintain physical fitness: Strength, flexibility, and endurance. Cardiovascular fitness. Maintain a health body weight. Avoid stress on the plantar fascia. Wear properly fitted shoes, including arch supports for individuals who have flat feet.  PROGNOSIS  If treated properly, then the symptoms of plantar fasciitis usually resolve without surgery. However, occasionally surgery is necessary.  RELATED COMPLICATIONS  Recurrent symptoms that may result in a chronic condition. Problems of the lower back that are caused by compensating for the injury, such as limping. Pain or weakness of the foot during push-off following surgery. Chronic inflammation, scarring, and partial or complete fascia tear, occurring more often from repeated injections.  TREATMENT  Treatment initially involves the use of ice and medication to help reduce pain and inflammation. The use of strengthening and stretching exercises may help reduce pain with activity, especially stretches of the Achilles tendon. These exercises may be performed at home or with a therapist. Your caregiver may recommend that you use heel cups of arch supports to help reduce stress on the plantar fascia. Occasionally, corticosteroid injections are given  to reduce inflammation. If symptoms persist for greater than 6 months despite non-surgical (conservative), then surgery may be recommended.   MEDICATION  If pain medication is necessary, then nonsteroidal anti-inflammatory medications, such as  aspirin and ibuprofen, or other minor pain relievers, such as acetaminophen, are often recommended. Do not take pain medication within 7 days before surgery. Prescription pain relievers may be given if deemed necessary by your caregiver. Use only as directed and only as much as you need. Corticosteroid injections may be given by your caregiver. These injections should be reserved for the most serious cases, because they may only be given a certain number of times.  HEAT AND COLD Cold treatment (icing) relieves pain and reduces inflammation. Cold treatment should be applied for 10 to 15 minutes every 2 to 3 hours for inflammation and pain and immediately after any activity that aggravates your symptoms. Use ice packs or massage the area with a piece of ice (ice massage). Heat treatment may be used prior to performing the stretching and strengthening activities prescribed by your caregiver, physical therapist, or athletic trainer. Use a heat pack or soak the injury in warm water.  SEEK IMMEDIATE MEDICAL CARE IF: Treatment seems to offer no benefit, or the condition worsens. Any medications produce adverse side effects.  EXERCISES- RANGE OF MOTION (ROM) AND STRETCHING EXERCISES - Plantar Fasciitis (Heel Spur Syndrome) These exercises may help you when beginning to rehabilitate your injury. Your symptoms may resolve with or without further involvement from your physician, physical therapist or athletic trainer. While completing these exercises, remember:  Restoring tissue flexibility helps normal motion to return to the joints. This allows healthier, less painful movement and activity. An effective stretch should be held for at least 30 seconds. A stretch should never be painful. You should only feel a gentle lengthening or release in the stretched tissue.  RANGE OF MOTION - Toe Extension, Flexion Sit with your right / left leg crossed over your opposite knee. Grasp your toes and gently pull them  back toward the top of your foot. You should feel a stretch on the bottom of your toes and/or foot. Hold this stretch for 10 seconds. Now, gently pull your toes toward the bottom of your foot. You should feel a stretch on the top of your toes and or foot. Hold this stretch for 10 seconds. Repeat  times. Complete this stretch 3 times per day.   RANGE OF MOTION - Ankle Dorsiflexion, Active Assisted Remove shoes and sit on a chair that is preferably not on a carpeted surface. Place right / left foot under knee. Extend your opposite leg for support. Keeping your heel down, slide your right / left foot back toward the chair until you feel a stretch at your ankle or calf. If you do not feel a stretch, slide your bottom forward to the edge of the chair, while still keeping your heel down. Hold this stretch for 10 seconds. Repeat 3 times. Complete this stretch 2 times per day.   STRETCH  Gastroc, Standing Place hands on wall. Extend right / left leg, keeping the front knee somewhat bent. Slightly point your toes inward on your back foot. Keeping your right / left heel on the floor and your knee straight, shift your weight toward the wall, not allowing your back to arch. You should feel a gentle stretch in the right / left calf. Hold this position for 10 seconds. Repeat 3 times. Complete this stretch 2 times per day.  STRETCH  Soleus, Standing Place hands on wall. Extend right / left leg, keeping the other knee somewhat bent. Slightly point your toes inward on your back foot. Keep your right / left heel on the floor, bend your back knee, and slightly shift your weight over the back leg so that you feel a gentle stretch deep in your back calf. Hold this position for 10 seconds. Repeat 3 times. Complete this stretch 2 times per day.  STRETCH  Gastrocsoleus, Standing  Note: This exercise can place a lot of stress on your foot and ankle. Please complete this exercise only if specifically instructed  by your caregiver.  Place the ball of your right / left foot on a step, keeping your other foot firmly on the same step. Hold on to the wall or a rail for balance. Slowly lift your other foot, allowing your body weight to press your heel down over the edge of the step. You should feel a stretch in your right / left calf. Hold this position for 10 seconds. Repeat this exercise with a slight bend in your right / left knee. Repeat 3 times. Complete this stretch 2 times per day.   STRENGTHENING EXERCISES - Plantar Fasciitis (Heel Spur Syndrome)  These exercises may help you when beginning to rehabilitate your injury. They may resolve your symptoms with or without further involvement from your physician, physical therapist or athletic trainer. While completing these exercises, remember:  Muscles can gain both the endurance and the strength needed for everyday activities through controlled exercises. Complete these exercises as instructed by your physician, physical therapist or athletic trainer. Progress the resistance and repetitions only as guided.  STRENGTH - Towel Curls Sit in a chair positioned on a non-carpeted surface. Place your foot on a towel, keeping your heel on the floor. Pull the towel toward your heel by only curling your toes. Keep your heel on the floor. Repeat 3 times. Complete this exercise 2 times per day.  STRENGTH - Ankle Inversion Secure one end of a rubber exercise band/tubing to a fixed object (table, pole). Loop the other end around your foot just before your toes. Place your fists between your knees. This will focus your strengthening at your ankle. Slowly, pull your big toe up and in, making sure the band/tubing is positioned to resist the entire motion. Hold this position for 10 seconds. Have your muscles resist the band/tubing as it slowly pulls your foot back to the starting position. Repeat 3 times. Complete this exercises 2 times per day.  Document Released:  10/06/2005 Document Revised: 12/29/2011 Document Reviewed: 01/18/2009 [facility] Patient Information 2014 [place], [place].
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
## 2023-05-26 ENCOUNTER — Encounter: Payer: Self-pay | Admitting: Psychiatry

## 2023-05-26 ENCOUNTER — Ambulatory Visit: Payer: No Typology Code available for payment source | Admitting: Psychiatry

## 2023-05-26 NOTE — Progress Notes (Deleted)
   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 [facility] 09/28/22 was unremarkable.  At his last visit he was started on Emgality for migraine prevention and Ubrelvy for rescue. Interval History: ***   Headache days per month: *** Migraine days per month*** Headache free days per month: ***  Current Headache Regimen: Preventative: *** Abortive: ***   Prior Therapies                                  Preventive: Carvedilol 12.5 mg BID Propranolol 60 mg BID Lisinopril 20 mg daily Losartan 50 mg daily Fluoxetine 20 mg daily Amitriptyline 50 mg QHS   Rescue: Zofran 4 mg PRN Fioricet Seroquel NSAIDs- GI discomfort, hx GI bleed Imitrex 50 mg PRN  Physical Exam:   Vital Signs: There were no vitals taken for this visit. GENERAL:  well appearing, in no acute distress, alert  SKIN:  Color, texture, turgor normal. No rashes or lesions HEAD:  Normocephalic/atraumatic. RESP: normal respiratory effort MSK:  No gross joint deformities.   NEUROLOGICAL: Mental Status: Alert, oriented to person, place and time, Follows commands, and Speech fluent and appropriate. Cranial Nerves: PERRL, face symmetric, no dysarthria, hearing grossly intact Motor: moves all extremities equally Gait: normal-based.  IMPRESSION: ***  PLAN: ***   Follow-up: ***  I spent a total of *** minutes on the date of the service. Headache education was done. Discussed lifestyle modification including increased oral hydration, decreased caffeine, exercise and stress management. Discussed treatment options including preventive and acute medications, natural supplements, and infusion therapy. Discussed medication overuse headache and to limit use of acute treatments to no more than 2 days/week or 10 days/month.  Discussed medication side effects, adverse reactions and drug interactions. Written educational materials and patient instructions outlining all of the above were given.  Ocie Doyne, MD

## 2023-06-04 ENCOUNTER — Ambulatory Visit: Payer: No Typology Code available for payment source | Admitting: Psychiatry

## 2023-06-11 ENCOUNTER — Other Ambulatory Visit: Payer: Self-pay

## 2023-06-11 ENCOUNTER — Encounter: Payer: Self-pay | Admitting: Emergency Medicine

## 2023-06-11 ENCOUNTER — Ambulatory Visit
Admission: EM | Admit: 2023-06-11 | Discharge: 2023-06-11 | Disposition: A | Discharge status: Home / Self Care | Payer: MEDICAID | Attending: Physician Assistant | Admitting: Physician Assistant

## 2023-06-11 DIAGNOSIS — Z1152 Encounter for screening for COVID-19: Secondary | ICD-10-CM | POA: Diagnosis present

## 2023-06-11 NOTE — ED Triage Notes (Signed)
Patient reports runny nose, sneezing.  Onset 2 days ago of symptoms.  Generalized aches and pain.    Patient 's daughter seen earlier and is covid positive.
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
## 2023-06-12 LAB — SARS CORONAVIRUS 2 (TAT 6-24 HRS): SARS Coronavirus 2: NEGATIVE

## 2023-07-15 ENCOUNTER — Telehealth: Payer: Self-pay

## 2023-07-15 ENCOUNTER — Other Ambulatory Visit (HOSPITAL_COMMUNITY): Payer: Self-pay

## 2023-07-15 NOTE — Telephone Encounter (Signed)
   Insurance is requiring PT try TWO of preferred meds-I know PT has tried one of them Imitrex (Sumatriptan)-Please advise

## 2023-07-15 NOTE — Telephone Encounter (Signed)
Must try maxalt

## 2023-07-15 NOTE — Telephone Encounter (Signed)
He can't take maxalt or other triptans due to his uncontrolled hypertension. Can we send an appeal? Thanks!

## 2023-07-15 NOTE — Telephone Encounter (Signed)
PA request has been Submitted. New Encounter created for follow up. For additional info see Pharmacy Prior Auth telephone encounter from 07/15/2023.

## 2023-07-15 NOTE — Telephone Encounter (Signed)
Pharmacy Patient Advocate Encounter   Received notification from Physician's Office that prior authorization for Ubrelvy 50MG  tablets is required/requested.   Insurance verification completed.   The patient is insured through [facility] .   Per test claim: PA required; PA started via CoverMyMeds. KEY [id number] . Waiting for clinical questions to populate.

## 2023-07-15 NOTE — Telephone Encounter (Signed)
Can you try to redo the PA for ubrelvy :  He has already tried and failed the following medications Abortive: Aleve, Preventative: Amitriptyline, Propranolol.   Prior Therapies:  preventative : Carvedilol, Propranolol, Lisinopril, Losartan, Fluoxetine, Amitriptyline, rescue:  Zofran, Fiorcet, Seroquel, NSAIDS, Imitrex.  He has started the [facility] for prevention.  Other Triptans are contraindicated for uncontrolled hypertension.  I believe that he needs to have a rescue medication Bernita Raisin)  for his breakthru migraines.   I think he was not approved due to not trying triptans, but if it is contraindicated, they may reconsider.    Jacob Adams

## 2023-07-15 NOTE — Telephone Encounter (Signed)
Can PA be resubmitted with Medicaid with this new info?

## 2023-07-16 ENCOUNTER — Other Ambulatory Visit (HOSPITAL_COMMUNITY): Payer: Self-pay

## 2023-07-16 NOTE — Telephone Encounter (Signed)
Melissa with [employer] called requesting information-PT has not been seen since Jan of 2024,at that time he was having 30 headache days per month-in order to get Ubrelvy approved-pt must have less than 15 Headache/migraine days per month. Can the office outreach the PT and get a current number on Headache frequency-Specifically the amount of days per month he has headaches. This will be open thru the end of day today-if information is received later than today then the PA will be denied because they can only go by the notes from [place] a new PA will need to be submitted with recent documentation and updated headache frequency.    Melissa Kuschel-Rep

## 2023-07-16 NOTE — Telephone Encounter (Signed)
Called pt. He reports his current headache days per month: 10-15 per month. Aware I will send back to PA team and will have them submit to see if we can get Ubrelvy approved. He verbalized understanding.

## 2023-07-16 NOTE — Telephone Encounter (Signed)
Pharmacy Patient Advocate Encounter  Received notification from [facility] that Prior Authorization for Ubrelvy 50MG  tablets has been APPROVED from 07/16/2023 to 07/15/2024. Ran test claim, Copay is $4.00. This test claim was processed through [facility]- copay amounts may vary at other pharmacies due to pharmacy/plan contracts, or as the patient moves through the different stages of their insurance plan.   PA #/Case ID/Reference #: PA Case ID #: [id number]

## 2023-07-16 NOTE — Telephone Encounter (Signed)
Noted  

## 2023-07-16 NOTE — Telephone Encounter (Signed)
Phone room: please call pt and let him know Bernita Raisin approved

## 2023-07-16 NOTE — Telephone Encounter (Signed)
Clinical questions have been submitted-awaiting determination.

## 2023-07-16 NOTE — Telephone Encounter (Signed)
Called Melissa back and gave her the updated information-awaiting determination fax.

## 2023-08-07 ENCOUNTER — Ambulatory Visit (INDEPENDENT_AMBULATORY_CARE_PROVIDER_SITE_OTHER): Payer: MEDICAID | Admitting: Podiatry

## 2023-08-07 ENCOUNTER — Encounter: Payer: Self-pay | Admitting: Podiatry

## 2023-08-07 VITALS — Ht 70.0 in | Wt 296.0 lb

## 2023-08-07 DIAGNOSIS — M7751 Other enthesopathy of right foot: Secondary | ICD-10-CM | POA: Diagnosis not present

## 2023-08-07 DIAGNOSIS — M722 Plantar fascial fibromatosis: Secondary | ICD-10-CM | POA: Diagnosis not present

## 2023-08-07 NOTE — Progress Notes (Unsigned)
   Subjective: Chief Complaint  Patient presents with   Foot Pain    Bilateral foot pain for a while sharp pain shooting up inside of both feet    57 year old male presents the office today for above concerns.  He states that injections did help some.  His right foot is worse than his left.  No recent injuries.  Tried changing shoes.  He does not have any inserts.  He has been on the bunion as well as the bottom of his feet.  Objective: AAO x3, NAD DP/PT pulses palpable bilaterally, CRT less than 3 seconds There is mild tenderness palpation on the plantar aspect of the foot along the arch mostly along the medial band of plantar fascia.  There is no area pinpoint tenderness identified today.  Bunion is present with mild tenderness along the first MPJ on the right side.  There is no edema there is no erythema.  No other areas of pinpoint tenderness.  MMT 5/5. No pain with calf compression, swelling, warmth, erythema  Assessment: Bilateral plantar fasciitis/arch pain, capsulitis  Plan: -All treatment options discussed with the patient including all alternatives, risks, complications.  -Steroid injection performed to the right bunion today.  Verbal consent obtained.  I cleaned the skin with alcohol, Betadine.  A mixture of 1 cc Kenalog 10, 0.5 cc Marcaine plain, 0.5 cc of lidocaine plain was infiltrated into the MTPJ without any complications.  Postinjection care discussed. Tolerated well -We discussed shoes, good arch support.  Discussed setting inserts and shoes.  Continue stretching, icing a regular basis. -Patient encouraged to call the office with any questions, concerns, change in symptoms.   Vivi Barrack DPM

## 2023-08-07 NOTE — Patient Instructions (Signed)
Plantar Fasciitis (Heel Spur Syndrome) with Rehab The plantar fascia is a fibrous, ligament-like, soft-tissue structure that spans the bottom of the foot. Plantar fasciitis is a condition that causes pain in the foot due to inflammation of the tissue. SYMPTOMS   Pain and tenderness on the underneath side of the foot.  Pain that worsens with standing or walking. CAUSES  Plantar fasciitis is caused by irritation and injury to the plantar fascia on the underneath side of the foot. Common mechanisms of injury include:  Direct trauma to bottom of the foot.  Damage to a small nerve that runs under the foot where the main fascia attaches to the heel bone.  Stress placed on the plantar fascia due to bone spurs. RISK INCREASES WITH:   Activities that place stress on the plantar fascia (running, jumping, pivoting, or cutting).  Poor strength and flexibility.  Improperly fitted shoes.  Tight calf muscles.  Flat feet.  Failure to warm-up properly before activity.  Obesity. PREVENTION  Warm up and stretch properly before activity.  Allow for adequate recovery between workouts.  Maintain physical fitness:  Strength, flexibility, and endurance.  Cardiovascular fitness.  Maintain a health body weight.  Avoid stress on the plantar fascia.  Wear properly fitted shoes, including arch supports for individuals who have flat feet.  PROGNOSIS  If treated properly, then the symptoms of plantar fasciitis usually resolve without surgery. However, occasionally surgery is necessary.  RELATED COMPLICATIONS   Recurrent symptoms that may result in a chronic condition.  Problems of the lower back that are caused by compensating for the injury, such as limping.  Pain or weakness of the foot during push-off following surgery.  Chronic inflammation, scarring, and partial or complete fascia tear, occurring more often from repeated injections.  TREATMENT  Treatment initially involves the  use of ice and medication to help reduce pain and inflammation. The use of strengthening and stretching exercises may help reduce pain with activity, especially stretches of the Achilles tendon. These exercises may be performed at home or with a therapist. Your caregiver may recommend that you use heel cups of arch supports to help reduce stress on the plantar fascia. Occasionally, corticosteroid injections are given to reduce inflammation. If symptoms persist for greater than 6 months despite non-surgical (conservative), then surgery may be recommended.   MEDICATION   If pain medication is necessary, then nonsteroidal anti-inflammatory medications, such as aspirin and ibuprofen, or other minor pain relievers, such as acetaminophen, are often recommended.  Do not take pain medication within 7 days before surgery.  Prescription pain relievers may be given if deemed necessary by your caregiver. Use only as directed and only as much as you need.  Corticosteroid injections may be given by your caregiver. These injections should be reserved for the most serious cases, because they may only be given a certain number of times.  HEAT AND COLD  Cold treatment (icing) relieves pain and reduces inflammation. Cold treatment should be applied for 10 to 15 minutes every 2 to 3 hours for inflammation and pain and immediately after any activity that aggravates your symptoms. Use ice packs or massage the area with a piece of ice (ice massage).  Heat treatment may be used prior to performing the stretching and strengthening activities prescribed by your caregiver, physical therapist, or athletic trainer. Use a heat pack or soak the injury in warm water.  SEEK IMMEDIATE MEDICAL CARE IF:  Treatment seems to offer no benefit, or the condition worsens.  Any medications   produce adverse side effects.  EXERCISES- RANGE OF MOTION (ROM) AND STRETCHING EXERCISES - Plantar Fasciitis (Heel Spur Syndrome) These exercises  may help you when beginning to rehabilitate your injury. Your symptoms may resolve with or without further involvement from your physician, physical therapist or athletic trainer. While completing these exercises, remember:   Restoring tissue flexibility helps normal motion to return to the joints. This allows healthier, less painful movement and activity.  An effective stretch should be held for at least 30 seconds.  A stretch should never be painful. You should only feel a gentle lengthening or release in the stretched tissue.  RANGE OF MOTION - Toe Extension, Flexion  Sit with your right / left leg crossed over your opposite knee.  Grasp your toes and gently pull them back toward the top of your foot. You should feel a stretch on the bottom of your toes and/or foot.  Hold this stretch for 10 seconds.  Now, gently pull your toes toward the bottom of your foot. You should feel a stretch on the top of your toes and or foot.  Hold this stretch for 10 seconds. Repeat  times. Complete this stretch 3 times per day.   RANGE OF MOTION - Ankle Dorsiflexion, Active Assisted  Remove shoes and sit on a chair that is preferably not on a carpeted surface.  Place right / left foot under knee. Extend your opposite leg for support.  Keeping your heel down, slide your right / left foot back toward the chair until you feel a stretch at your ankle or calf. If you do not feel a stretch, slide your bottom forward to the edge of the chair, while still keeping your heel down.  Hold this stretch for 10 seconds. Repeat 3 times. Complete this stretch 2 times per day.   STRETCH  Gastroc, Standing  Place hands on wall.  Extend right / left leg, keeping the front knee somewhat bent.  Slightly point your toes inward on your back foot.  Keeping your right / left heel on the floor and your knee straight, shift your weight toward the wall, not allowing your back to arch.  You should feel a gentle stretch  in the right / left calf. Hold this position for 10 seconds. Repeat 3 times. Complete this stretch 2 times per day.  STRETCH  Soleus, Standing  Place hands on wall.  Extend right / left leg, keeping the other knee somewhat bent.  Slightly point your toes inward on your back foot.  Keep your right / left heel on the floor, bend your back knee, and slightly shift your weight over the back leg so that you feel a gentle stretch deep in your back calf.  Hold this position for 10 seconds. Repeat 3 times. Complete this stretch 2 times per day.  STRETCH  Gastrocsoleus, Standing  Note: This exercise can place a lot of stress on your foot and ankle. Please complete this exercise only if specifically instructed by your caregiver.   Place the ball of your right / left foot on a step, keeping your other foot firmly on the same step.  Hold on to the wall or a rail for balance.  Slowly lift your other foot, allowing your body weight to press your heel down over the edge of the step.  You should feel a stretch in your right / left calf.  Hold this position for 10 seconds.  Repeat this exercise with a slight bend in your right /   left knee. Repeat 3 times. Complete this stretch 2 times per day.   STRENGTHENING EXERCISES - Plantar Fasciitis (Heel Spur Syndrome)  These exercises may help you when beginning to rehabilitate your injury. They may resolve your symptoms with or without further involvement from your physician, physical therapist or athletic trainer. While completing these exercises, remember:   Muscles can gain both the endurance and the strength needed for everyday activities through controlled exercises.  Complete these exercises as instructed by your physician, physical therapist or athletic trainer. Progress the resistance and repetitions only as guided.  STRENGTH - Towel Curls  Sit in a chair positioned on a non-carpeted surface.  Place your foot on a towel, keeping your heel  on the floor.  Pull the towel toward your heel by only curling your toes. Keep your heel on the floor. Repeat 3 times. Complete this exercise 2 times per day.  STRENGTH - Ankle Inversion  Secure one end of a rubber exercise band/tubing to a fixed object (table, pole). Loop the other end around your foot just before your toes.  Place your fists between your knees. This will focus your strengthening at your ankle.  Slowly, pull your big toe up and in, making sure the band/tubing is positioned to resist the entire motion.  Hold this position for 10 seconds.  Have your muscles resist the band/tubing as it slowly pulls your foot back to the starting position. Repeat 3 times. Complete this exercises 2 times per day.  Document Released: 10/06/2005 Document Revised: 12/29/2011 Document Reviewed: 01/18/2009 [facility] Patient Information 2014 [place], [place].   ICE INSTRUCTIONS  Apply ice or cold pack to the affected area at least 3 times a day for 10-15 minutes each time.  You should also use ice after prolonged activity or vigorous exercise.  Do not apply ice longer than 20 minutes at one time.  Always keep a cloth between your skin and the ice pack to prevent burns.  Being consistent and following these instructions will help control your symptoms.  We suggest you purchase a gel ice pack because they are reusable and do bit leak.  Some of them are designed to wrap around the area.  Use the method that works best for you.  Here are some other suggestions for icing.   Use a frozen bag of peas or corn-inexpensive and molds well to your body, usually stays frozen for 10 to 20 minutes.  Wet a towel with cold water and squeeze out the excess until it's damp.  Place in a bag in the freezer for 20 minutes. Then remove and use.

## 2023-08-11 ENCOUNTER — Other Ambulatory Visit (INDEPENDENT_AMBULATORY_CARE_PROVIDER_SITE_OTHER): Payer: MEDICAID

## 2023-08-11 ENCOUNTER — Ambulatory Visit (INDEPENDENT_AMBULATORY_CARE_PROVIDER_SITE_OTHER): Payer: No Typology Code available for payment source | Admitting: Orthopaedic Surgery

## 2023-08-11 ENCOUNTER — Encounter: Payer: Self-pay | Admitting: Orthopaedic Surgery

## 2023-08-11 VITALS — Ht 70.0 in | Wt 289.0 lb

## 2023-08-11 DIAGNOSIS — M25562 Pain in left knee: Secondary | ICD-10-CM | POA: Diagnosis not present

## 2023-08-11 DIAGNOSIS — M25561 Pain in right knee: Secondary | ICD-10-CM | POA: Diagnosis not present

## 2023-08-11 DIAGNOSIS — G8929 Other chronic pain: Secondary | ICD-10-CM | POA: Diagnosis not present

## 2023-08-11 DIAGNOSIS — Z6841 Body Mass Index (BMI) 40.0 and over, adult: Secondary | ICD-10-CM

## 2023-08-11 MED ORDER — BUPIVACAINE HCL 0.5 % IJ SOLN
2.0000 mL | INTRAMUSCULAR | Status: AC | PRN
Start: 2023-08-11 — End: 2023-08-11
  Administered 2023-08-11: 2 mL via INTRA_ARTICULAR

## 2023-08-11 MED ORDER — LIDOCAINE HCL 1 % IJ SOLN
2.0000 mL | INTRAMUSCULAR | Status: AC | PRN
  Administered 2023-08-11: 2 mL

## 2023-08-11 MED ORDER — METHYLPREDNISOLONE ACETATE 40 MG/ML IJ SUSP
40.0000 mg | INTRAMUSCULAR | Status: AC | PRN
  Administered 2023-08-11: 40 mg via INTRA_ARTICULAR
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
## 2023-10-20 ENCOUNTER — Telehealth: Payer: Self-pay | Admitting: Orthopaedic Surgery

## 2023-10-20 NOTE — Telephone Encounter (Signed)
Pt called in stating he has met his weight goal to be able to have knee replacement surgery please advise on next steps
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
## 2023-11-03 ENCOUNTER — Ambulatory Visit: Payer: No Typology Code available for payment source | Admitting: Orthopaedic Surgery

## 2023-11-03 DIAGNOSIS — M1711 Unilateral primary osteoarthritis, right knee: Secondary | ICD-10-CM

## 2023-11-03 DIAGNOSIS — M1712 Unilateral primary osteoarthritis, left knee: Secondary | ICD-10-CM

## 2023-11-05 ENCOUNTER — Ambulatory Visit (INDEPENDENT_AMBULATORY_CARE_PROVIDER_SITE_OTHER): Payer: MEDICAID | Admitting: Podiatry

## 2023-11-05 DIAGNOSIS — M722 Plantar fascial fibromatosis: Secondary | ICD-10-CM

## 2023-11-05 NOTE — Progress Notes (Signed)
   Subjective: Chief Complaint  Patient presents with   Plantar Fasciitis    RM#13 Follow up on both feet patient states he is still having whiteness to both feet soreness when he walks .    58 year old male presents the office today for above concerns.  He points on the right first MTPJ where he has majority discomfort.  He has Voltaren gel at home he has not been using this.  Denies any recent injuries.   Objective: AAO x3, NAD DP/PT pulses palpable bilaterally, CRT less than 3 seconds The majority tenderness is localized along the first MTPJ.  Mild bunion is present.  There is no erythema or any increased warmth.  Slight edema present.  He does get some discomfort in the arch of the foot along the medial band plantar fascia.  There is no area pinpoint tenderness.  MMT 5/5.  Decreased medial arch height. No pain with calf compression, swelling, warmth, erythema  Assessment: Bilateral plantar fasciitis/arch pain, capsulitis  Plan: -All treatment options discussed with the patient including all alternatives, risks, complications.  -We discussed steroid injection.  For now we will continue Voltaren gel.  He is wearing shoes today without any inserts in his shoes, even the ones that come with them.  Dispensed power step inserts to help provide better support.  If symptoms persist we will inject.  Return in about 2 months (around 01/03/2024).  Vivi Barrack DPM

## 2023-11-06 ENCOUNTER — Ambulatory Visit: Payer: MEDICAID | Admitting: Podiatry
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
## 2023-11-10 ENCOUNTER — Ambulatory Visit: Payer: MEDICAID | Admitting: Orthopaedic Surgery

## 2023-11-10 DIAGNOSIS — M1712 Unilateral primary osteoarthritis, left knee: Secondary | ICD-10-CM

## 2023-11-12 ENCOUNTER — Encounter: Payer: Self-pay | Admitting: Orthopaedic Surgery

## 2023-11-12 ENCOUNTER — Ambulatory Visit: Payer: MEDICAID | Admitting: Orthopaedic Surgery

## 2023-11-12 ENCOUNTER — Other Ambulatory Visit (INDEPENDENT_AMBULATORY_CARE_PROVIDER_SITE_OTHER): Payer: MEDICAID

## 2023-11-12 VITALS — Ht 69.5 in | Wt 252.0 lb

## 2023-11-12 DIAGNOSIS — M1712 Unilateral primary osteoarthritis, left knee: Secondary | ICD-10-CM
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
## 2023-12-10 ENCOUNTER — Encounter: Payer: No Typology Code available for payment source | Admitting: Bariatrics

## 2023-12-11 ENCOUNTER — Telehealth: Payer: Self-pay | Admitting: Orthopaedic Surgery

## 2023-12-23 ENCOUNTER — Other Ambulatory Visit (HOSPITAL_COMMUNITY): Payer: Self-pay

## 2023-12-23 ENCOUNTER — Telehealth: Payer: Self-pay | Admitting: Pharmacy Technician

## 2023-12-23 NOTE — Telephone Encounter (Signed)
 Pharmacy Patient Advocate Encounter   Received notification from CoverMyMeds that prior authorization for Ubrelvy 50MG  tablets is required/requested.   Insurance verification completed.   The patient is insured through [employer] [place] .   Per test claim: The current 30 day co-pay is, $4.00.  No PA needed at this time. This test claim was processed through [facility]- copay amounts may vary at other pharmacies due to pharmacy/plan contracts, or as the patient moves through the different stages of their insurance plan.

## 2023-12-31 ENCOUNTER — Inpatient Hospital Stay (HOSPITAL_COMMUNITY)
Admission: EM | Admit: 2023-12-31 | Discharge: 2024-01-04 | DRG: 885 | Disposition: A | Discharge status: Other Acute Inpatient Hospital | Payer: MEDICAID | Attending: Family Medicine | Admitting: Family Medicine

## 2023-12-31 ENCOUNTER — Emergency Department (HOSPITAL_COMMUNITY): Payer: MEDICAID

## 2023-12-31 ENCOUNTER — Inpatient Hospital Stay (HOSPITAL_COMMUNITY): Payer: MEDICAID

## 2023-12-31 ENCOUNTER — Encounter (HOSPITAL_COMMUNITY): Payer: Self-pay

## 2023-12-31 ENCOUNTER — Other Ambulatory Visit: Payer: Self-pay

## 2023-12-31 DIAGNOSIS — R1310 Dysphagia, unspecified: Secondary | ICD-10-CM | POA: Diagnosis present

## 2023-12-31 DIAGNOSIS — E872 Acidosis, unspecified: Secondary | ICD-10-CM | POA: Diagnosis present

## 2023-12-31 DIAGNOSIS — Z1152 Encounter for screening for COVID-19: Secondary | ICD-10-CM | POA: Diagnosis not present

## 2023-12-31 DIAGNOSIS — D649 Anemia, unspecified: Secondary | ICD-10-CM | POA: Diagnosis present

## 2023-12-31 DIAGNOSIS — Z6372 Alcoholism and drug addiction in family: Secondary | ICD-10-CM

## 2023-12-31 DIAGNOSIS — F4310 Post-traumatic stress disorder, unspecified: Secondary | ICD-10-CM | POA: Diagnosis present

## 2023-12-31 DIAGNOSIS — K766 Portal hypertension: Secondary | ICD-10-CM | POA: Diagnosis present

## 2023-12-31 DIAGNOSIS — K7460 Unspecified cirrhosis of liver: Secondary | ICD-10-CM | POA: Diagnosis present

## 2023-12-31 DIAGNOSIS — E039 Hypothyroidism, unspecified: Secondary | ICD-10-CM | POA: Diagnosis present

## 2023-12-31 DIAGNOSIS — K3189 Other diseases of stomach and duodenum: Secondary | ICD-10-CM | POA: Diagnosis present

## 2023-12-31 DIAGNOSIS — F1020 Alcohol dependence, uncomplicated: Secondary | ICD-10-CM | POA: Diagnosis present

## 2023-12-31 DIAGNOSIS — Z9151 Personal history of suicidal behavior: Secondary | ICD-10-CM

## 2023-12-31 DIAGNOSIS — Z8249 Family history of ischemic heart disease and other diseases of the circulatory system: Secondary | ICD-10-CM

## 2023-12-31 DIAGNOSIS — K449 Diaphragmatic hernia without obstruction or gangrene: Secondary | ICD-10-CM | POA: Diagnosis present

## 2023-12-31 DIAGNOSIS — F1210 Cannabis abuse, uncomplicated: Secondary | ICD-10-CM | POA: Diagnosis present

## 2023-12-31 DIAGNOSIS — K922 Gastrointestinal hemorrhage, unspecified: Secondary | ICD-10-CM

## 2023-12-31 DIAGNOSIS — Z635 Disruption of family by separation and divorce: Secondary | ICD-10-CM

## 2023-12-31 DIAGNOSIS — Z6837 Body mass index (BMI) 37.0-37.9, adult: Secondary | ICD-10-CM

## 2023-12-31 DIAGNOSIS — E11649 Type 2 diabetes mellitus with hypoglycemia without coma: Secondary | ICD-10-CM | POA: Diagnosis present

## 2023-12-31 DIAGNOSIS — K219 Gastro-esophageal reflux disease without esophagitis: Secondary | ICD-10-CM | POA: Diagnosis not present

## 2023-12-31 DIAGNOSIS — K648 Other hemorrhoids: Secondary | ICD-10-CM | POA: Diagnosis present

## 2023-12-31 DIAGNOSIS — R1013 Epigastric pain: Secondary | ICD-10-CM | POA: Diagnosis not present

## 2023-12-31 DIAGNOSIS — I5022 Chronic systolic (congestive) heart failure: Secondary | ICD-10-CM | POA: Diagnosis present

## 2023-12-31 DIAGNOSIS — Z560 Unemployment, unspecified: Secondary | ICD-10-CM

## 2023-12-31 DIAGNOSIS — F250 Schizoaffective disorder, bipolar type: Secondary | ICD-10-CM | POA: Diagnosis present

## 2023-12-31 DIAGNOSIS — I2510 Atherosclerotic heart disease of native coronary artery without angina pectoris: Secondary | ICD-10-CM | POA: Diagnosis present

## 2023-12-31 DIAGNOSIS — M545 Low back pain, unspecified: Secondary | ICD-10-CM

## 2023-12-31 DIAGNOSIS — I110 Hypertensive heart disease with heart failure: Secondary | ICD-10-CM | POA: Diagnosis present

## 2023-12-31 DIAGNOSIS — F609 Personality disorder, unspecified: Secondary | ICD-10-CM | POA: Diagnosis present

## 2023-12-31 DIAGNOSIS — E669 Obesity, unspecified: Secondary | ICD-10-CM | POA: Diagnosis present

## 2023-12-31 DIAGNOSIS — Z800 Family history of malignant neoplasm of digestive organs: Secondary | ICD-10-CM

## 2023-12-31 DIAGNOSIS — R4589 Other symptoms and signs involving emotional state: Secondary | ICD-10-CM | POA: Diagnosis not present

## 2023-12-31 DIAGNOSIS — K51911 Ulcerative colitis, unspecified with rectal bleeding: Secondary | ICD-10-CM | POA: Diagnosis present

## 2023-12-31 DIAGNOSIS — F17210 Nicotine dependence, cigarettes, uncomplicated: Secondary | ICD-10-CM | POA: Diagnosis present

## 2023-12-31 DIAGNOSIS — K2100 Gastro-esophageal reflux disease with esophagitis, without bleeding: Secondary | ICD-10-CM | POA: Diagnosis present

## 2023-12-31 DIAGNOSIS — Z79890 Hormone replacement therapy (postmenopausal): Secondary | ICD-10-CM | POA: Diagnosis not present

## 2023-12-31 DIAGNOSIS — F4323 Adjustment disorder with mixed anxiety and depressed mood: Secondary | ICD-10-CM | POA: Diagnosis not present

## 2023-12-31 DIAGNOSIS — Z7984 Long term (current) use of oral hypoglycemic drugs: Secondary | ICD-10-CM

## 2023-12-31 DIAGNOSIS — E162 Hypoglycemia, unspecified: Principal | ICD-10-CM

## 2023-12-31 DIAGNOSIS — F332 Major depressive disorder, recurrent severe without psychotic features: Secondary | ICD-10-CM | POA: Diagnosis not present

## 2023-12-31 DIAGNOSIS — F4481 Dissociative identity disorder: Secondary | ICD-10-CM | POA: Diagnosis present

## 2023-12-31 DIAGNOSIS — R45851 Suicidal ideations: Secondary | ICD-10-CM | POA: Diagnosis not present

## 2023-12-31 DIAGNOSIS — Z9109 Other allergy status, other than to drugs and biological substances: Secondary | ICD-10-CM

## 2023-12-31 DIAGNOSIS — E876 Hypokalemia: Secondary | ICD-10-CM | POA: Diagnosis present

## 2023-12-31 DIAGNOSIS — F1090 Alcohol use, unspecified, uncomplicated: Secondary | ICD-10-CM | POA: Diagnosis not present

## 2023-12-31 DIAGNOSIS — Z811 Family history of alcohol abuse and dependence: Secondary | ICD-10-CM

## 2023-12-31 DIAGNOSIS — F10229 Alcohol dependence with intoxication, unspecified: Secondary | ICD-10-CM | POA: Diagnosis present

## 2023-12-31 DIAGNOSIS — K625 Hemorrhage of anus and rectum: Secondary | ICD-10-CM | POA: Diagnosis not present

## 2023-12-31 DIAGNOSIS — Z860102 Personal history of hyperplastic colon polyps: Secondary | ICD-10-CM

## 2023-12-31 DIAGNOSIS — Z880 Allergy status to penicillin: Secondary | ICD-10-CM

## 2023-12-31 DIAGNOSIS — Y905 Blood alcohol level of 100-119 mg/100 ml: Secondary | ICD-10-CM | POA: Diagnosis present

## 2023-12-31 DIAGNOSIS — Z8711 Personal history of peptic ulcer disease: Secondary | ICD-10-CM

## 2023-12-31 DIAGNOSIS — F1010 Alcohol abuse, uncomplicated: Secondary | ICD-10-CM

## 2023-12-31 DIAGNOSIS — R7309 Other abnormal glucose: Secondary | ICD-10-CM

## 2023-12-31 DIAGNOSIS — Z608 Other problems related to social environment: Secondary | ICD-10-CM | POA: Diagnosis present

## 2023-12-31 DIAGNOSIS — Z8719 Personal history of other diseases of the digestive system: Secondary | ICD-10-CM

## 2023-12-31 DIAGNOSIS — Z79899 Other long term (current) drug therapy: Secondary | ICD-10-CM

## 2023-12-31 DIAGNOSIS — Z9152 Personal history of nonsuicidal self-harm: Secondary | ICD-10-CM

## 2023-12-31 DIAGNOSIS — Z886 Allergy status to analgesic agent status: Secondary | ICD-10-CM

## 2023-12-31 DIAGNOSIS — Z5982 Transportation insecurity: Secondary | ICD-10-CM

## 2023-12-31 LAB — ETHANOL: Alcohol, Ethyl (B): 113 mg/dL — ABNORMAL HIGH (ref <10)

## 2023-12-31 LAB — CBG MONITORING, ED
Glucose-Capillary: 58 mg/dL — ABNORMAL LOW (ref 70–99)
Glucose-Capillary: 135 mg/dL — ABNORMAL HIGH (ref 70–99)
Glucose-Capillary: 105 mg/dL — ABNORMAL HIGH (ref 70–99)
Glucose-Capillary: 64 mg/dL — ABNORMAL LOW (ref 70–99)
Glucose-Capillary: 69 mg/dL — ABNORMAL LOW (ref 70–99)
Glucose-Capillary: 66 mg/dL — ABNORMAL LOW (ref 70–99)
Glucose-Capillary: 86 mg/dL (ref 70–99)
Glucose-Capillary: 85 mg/dL (ref 70–99)

## 2023-12-31 LAB — COMPREHENSIVE METABOLIC PANEL
ALT: 21 U/L (ref 0–44)
AST: 32 U/L (ref 15–41)
Albumin: 4.2 g/dL (ref 3.5–5.0)
Alkaline Phosphatase: 78 U/L (ref 38–126)
Anion gap: 18 — ABNORMAL HIGH (ref 5–15)
BUN: 16 mg/dL (ref 6–20)
CO2: 18 mmol/L — ABNORMAL LOW (ref 22–32)
Calcium: 8.8 mg/dL — ABNORMAL LOW (ref 8.9–10.3)
Chloride: 103 mmol/L (ref 98–111)
Creatinine, Ser: 0.91 mg/dL (ref 0.61–1.24)
GFR, Estimated: >60 mL/min (ref >60)
Glucose, Bld: 67 mg/dL — ABNORMAL LOW (ref 70–99)
Potassium: 3.3 mmol/L — ABNORMAL LOW (ref 3.5–5.1)
Sodium: 139 mmol/L (ref 135–145)
Total Bilirubin: 0.8 mg/dL (ref 0.0–1.2)
Total Protein: 7.7 g/dL (ref 6.5–8.1)

## 2023-12-31 LAB — TYPE AND SCREEN
ABO/RH(D): O POS
Antibody Screen: NEGATIVE

## 2023-12-31 LAB — AMMONIA: Ammonia: 38 umol/L — ABNORMAL HIGH (ref 9–35)

## 2023-12-31 LAB — URINALYSIS, ROUTINE W REFLEX MICROSCOPIC
Bacteria, UA: NONE SEEN
Bilirubin Urine: NEGATIVE
Glucose, UA: >=500 mg/dL — AB
Ketones, ur: 20 mg/dL — AB
Leukocytes,Ua: NEGATIVE
Nitrite: NEGATIVE
Protein, ur: 100 mg/dL — AB
Specific Gravity, Urine: 1.021 (ref 1.005–1.030)
pH: 5 (ref 5.0–8.0)

## 2023-12-31 LAB — RESP PANEL BY RT-PCR (RSV, FLU A&B, COVID)  RVPGX2
Influenza A by PCR: NEGATIVE
Influenza B by PCR: NEGATIVE
Resp Syncytial Virus by PCR: NEGATIVE
SARS Coronavirus 2 by RT PCR: NEGATIVE

## 2023-12-31 LAB — APTT: aPTT: 33 s (ref 24–36)

## 2023-12-31 LAB — RAPID URINE DRUG SCREEN, HOSP PERFORMED
Amphetamines: NOT DETECTED
Barbiturates: NOT DETECTED
Benzodiazepines: NOT DETECTED
Cocaine: NOT DETECTED
Opiates: NOT DETECTED
Tetrahydrocannabinol: POSITIVE — AB

## 2023-12-31 LAB — PROTIME-INR
INR: 1 (ref 0.8–1.2)
Prothrombin Time: 13.3 s (ref 11.4–15.2)

## 2023-12-31 LAB — CBC
HCT: 41.5 % (ref 39.0–52.0)
Hemoglobin: 13.1 g/dL (ref 13.0–17.0)
MCH: 28.9 pg (ref 26.0–34.0)
MCHC: 31.6 g/dL (ref 30.0–36.0)
MCV: 91.6 fL (ref 80.0–100.0)
Platelets: 348 10*3/uL (ref 150–400)
RBC: 4.53 MIL/uL (ref 4.22–5.81)
RDW: 14.4 % (ref 11.5–15.5)
WBC: 7.3 10*3/uL (ref 4.0–10.5)
nRBC: 0 % (ref 0.0–0.2)

## 2023-12-31 LAB — ACETAMINOPHEN LEVEL: Acetaminophen (Tylenol), Serum: <10 ug/mL — ABNORMAL LOW (ref 10–30)

## 2023-12-31 LAB — TROPONIN I (HIGH SENSITIVITY)
Troponin I (High Sensitivity): 5 ng/L (ref <18)
Troponin I (High Sensitivity): 5 ng/L (ref <18)

## 2023-12-31 LAB — SALICYLATE LEVEL: Salicylate Lvl: <7 mg/dL — ABNORMAL LOW (ref 7.0–30.0)

## 2023-12-31 LAB — LIPASE, BLOOD: Lipase: 63 U/L — ABNORMAL HIGH (ref 11–51)

## 2023-12-31 LAB — I-STAT CG4 LACTIC ACID, ED: Lactic Acid, Venous: 2.8 mmol/L (ref 0.5–1.9)

## 2023-12-31 LAB — POC OCCULT BLOOD, ED: Fecal Occult Bld: POSITIVE — AB

## 2023-12-31 LAB — BRAIN NATRIURETIC PEPTIDE: B Natriuretic Peptide: 28.9 pg/mL (ref 0.0–100.0)

## 2023-12-31 MED ORDER — DEXTROSE 50 % IV SOLN
1.0000 | Freq: Once | INTRAVENOUS | Status: AC
  Administered 2023-12-31: 50 mL via INTRAVENOUS
  Filled 2023-12-31: qty 50

## 2023-12-31 MED ORDER — UBROGEPANT 50 MG PO TABS: 50.0000 mg | ORAL_TABLET | ORAL | Status: DC | PRN

## 2023-12-31 MED ORDER — ADULT MULTIVITAMIN W/MINERALS CH
1.0000 | ORAL_TABLET | Freq: Every day | ORAL | Status: DC
  Administered 2023-12-31 – 2024-01-04 (×5): 1 via ORAL
  Filled 2023-12-31 (×5): qty 1

## 2023-12-31 MED ORDER — LORAZEPAM 1 MG PO TABS: 1.0000 mg | ORAL_TABLET | Freq: Four times a day (QID) | ORAL | Status: AC | PRN

## 2023-12-31 MED ORDER — POTASSIUM CHLORIDE 20 MEQ PO PACK
40.0000 meq | PACK | Freq: Once | ORAL | Status: AC
Start: 2023-12-31 — End: 2023-12-31
  Administered 2023-12-31: 40 meq via ORAL
  Filled 2023-12-31: qty 2

## 2023-12-31 MED ORDER — PANTOPRAZOLE SODIUM 40 MG IV SOLR
40.0000 mg | Freq: Once | INTRAVENOUS | Status: AC
  Administered 2023-12-31: 40 mg via INTRAVENOUS
  Filled 2023-12-31: qty 10

## 2023-12-31 MED ORDER — THIAMINE HCL 100 MG/ML IJ SOLN
100.0000 mg | Freq: Once | INTRAMUSCULAR | Status: AC
  Administered 2023-12-31: 100 mg via INTRAVENOUS
  Filled 2023-12-31: qty 2

## 2023-12-31 MED ORDER — HYDROXYZINE HCL 25 MG PO TABS: 25.0000 mg | ORAL_TABLET | Freq: Four times a day (QID) | ORAL | Status: AC | PRN

## 2023-12-31 MED ORDER — ATORVASTATIN CALCIUM 40 MG PO TABS
80.0000 mg | ORAL_TABLET | Freq: Every day | ORAL | Status: DC
  Administered 2023-12-31 – 2024-01-03 (×4): 80 mg via ORAL
  Filled 2023-12-31 (×4): qty 2

## 2023-12-31 MED ORDER — POLYETHYLENE GLYCOL 3350 17 G PO PACK: 17.0000 g | PACK | Freq: Every day | ORAL | Status: DC | PRN

## 2023-12-31 MED ORDER — LOSARTAN POTASSIUM 50 MG PO TABS: 50.0000 mg | ORAL_TABLET | Freq: Every day | ORAL | Status: DC

## 2023-12-31 MED ORDER — THIAMINE MONONITRATE 100 MG PO TABS
100.0000 mg | ORAL_TABLET | Freq: Every day | ORAL | Status: DC
  Administered 2023-12-31 – 2024-01-04 (×5): 100 mg via ORAL
  Filled 2023-12-31 (×8): qty 1

## 2023-12-31 MED ORDER — LOPERAMIDE HCL 2 MG PO CAPS: 2.0000 mg | ORAL_CAPSULE | ORAL | Status: AC | PRN

## 2023-12-31 MED ORDER — SACUBITRIL-VALSARTAN 49-51 MG PO TABS
1.0000 | ORAL_TABLET | Freq: Two times a day (BID) | ORAL | Status: DC
  Administered 2024-01-01 – 2024-01-04 (×7): 1 via ORAL
  Filled 2023-12-31 (×8): qty 1

## 2023-12-31 MED ORDER — CARVEDILOL 6.25 MG PO TABS
6.2500 mg | ORAL_TABLET | Freq: Two times a day (BID) | ORAL | Status: DC
  Administered 2023-12-31 – 2024-01-04 (×8): 6.25 mg via ORAL
  Filled 2023-12-31 (×8): qty 1

## 2023-12-31 MED ORDER — SPIRONOLACTONE 25 MG PO TABS
25.0000 mg | ORAL_TABLET | Freq: Every day | ORAL | Status: DC
  Administered 2023-12-31 – 2024-01-04 (×5): 25 mg via ORAL
  Filled 2023-12-31 (×5): qty 1

## 2023-12-31 MED ORDER — ACETAMINOPHEN 325 MG PO TABS
650.0000 mg | ORAL_TABLET | Freq: Four times a day (QID) | ORAL | Status: DC | PRN
  Filled 2023-12-31: qty 2

## 2023-12-31 MED ORDER — ACETAMINOPHEN 650 MG RE SUPP: 650.0000 mg | Freq: Four times a day (QID) | RECTAL | Status: DC | PRN

## 2023-12-31 MED ORDER — SODIUM CHLORIDE 0.9% FLUSH
3.0000 mL | Freq: Two times a day (BID) | INTRAVENOUS | Status: DC
  Administered 2023-12-31 – 2024-01-03 (×5): 3 mL via INTRAVENOUS

## 2023-12-31 MED ORDER — GLUCOSE 40 % PO GEL
1.0000 | Freq: Once | ORAL | Status: AC
  Administered 2023-12-31: 31 g via ORAL
  Filled 2023-12-31: qty 1

## 2023-12-31 MED ORDER — PANTOPRAZOLE SODIUM 40 MG IV SOLR
40.0000 mg | INTRAVENOUS | Status: DC
  Administered 2024-01-01 – 2024-01-02 (×2): 40 mg via INTRAVENOUS
  Filled 2023-12-31 (×2): qty 10

## 2023-12-31 MED ORDER — THIAMINE HCL 100 MG/ML IJ SOLN: 100.0000 mg | Freq: Once | INTRAMUSCULAR | Status: DC

## 2023-12-31 MED ORDER — NALTREXONE HCL 50 MG PO TABS
50.0000 mg | ORAL_TABLET | Freq: Every day | ORAL | Status: DC
Start: 2024-01-01 — End: 2024-01-01
  Administered 2024-01-01: 50 mg via ORAL
  Filled 2023-12-31: qty 1

## 2023-12-31 MED ORDER — SODIUM CHLORIDE 0.9 % IV BOLUS
1000.0000 mL | Freq: Once | INTRAVENOUS | Status: AC
  Administered 2023-12-31: 1000 mL via INTRAVENOUS

## 2023-12-31 MED ORDER — ONDANSETRON 4 MG PO TBDP: 4.0000 mg | ORAL_TABLET | Freq: Four times a day (QID) | ORAL | Status: AC | PRN

## 2023-12-31 MED ORDER — DEXTROSE 5 % IV SOLN: INTRAVENOUS | Status: DC

## 2023-12-31 NOTE — Assessment & Plan Note (Signed)
 Traumatic, no focal neurologic deficit. Check Xray lumbar spine.

## 2023-12-31 NOTE — Assessment & Plan Note (Addendum)
 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  Need to rule out alternate etiologies.  Therefore we will get a right upper quadrant ultrasound as well as x-ray abdomen.  Lipase is not consistent with acute pancreatitis. Rx with pantop

## 2023-12-31 NOTE — Assessment & Plan Note (Signed)
 Acute X 7 days. Preceded by long remission. Unlikely to withdraw. Will monitor clincially.

## 2023-12-31 NOTE — ED Provider Notes (Signed)
 Received signout from previous provider, please see his note for complete HPI.  58 year old male with multiple comorbidities which includes alcohol abuse, CHF, GI bleed, depression, anxiety presenting for evaluation of suicidal ideation.  His workup was remarkable for persistent hypoglycemia despite receiving treatment.  He is currently on a D5 drip and will need to be admitted for further management.  He is not on any oral anti glycemic agent.  He does have elevated lactic acid of 2.8 but no infectious etiology.  He has fecal occult blood positive and did report having bloody stool as well as having bouts of dizziness.  Rectal examination remarkable for some bright red blood in the rectal vault but no active bleeding.  Patient also has multiple external hemorrhoid.  Does have known history of GI bleed.  He is receiving Protonix.  His hemoglobin is 13.1.  He has been followed by [facility] in the past.    -Labs ordered, independently viewed and interpreted by me.  Labs remarkable for persistent hypoglycemia despite receiving food, an amp of D50 as well as D5 drip -The patient was maintained on a cardiac monitor.  I personally viewed and interpreted the cardiac monitored which showed an underlying rhythm of: NSR -Imaging independently viewed and interpreted by me and I agree with radiologist's interpretation.  Result remarkable for head CT without acute changes, CXR unremarkable.  -This patient presents to the ED for concern of SI, this involves an extensive number of treatment options, and is a complaint that carries with it a high risk of complications and morbidity.  The differential diagnosis includes anxiety, depression, drug induced mood instability -Co morbidities that complicate the patient evaluation includes UC, alcohol abuse, CHF, GIB, anemia, HTN,  -Treatment includes dextrose, thiamin, protonix, IVF -Reevaluation of the patient after these medicines showed that the patient stayed the  same -PCP office notes or outside notes reviewed -Discussion with specialist [facility] who will see pt tomorrow.  I have consulted with [facility] DR. Charmayne Sheer who agrees to admit pt but request psych involvement.   -Escalation to admission/observation considered: pt will be admitted  .Critical Care  Performed by: Fayrene Helper, PA-C Authorized by: Fayrene Helper, PA-C   Critical care provider statement:    Critical care time (minutes):  35   Critical care was time spent personally by me on the following activities:  Development of treatment plan with patient or surrogate, discussions with consultants, evaluation of patient's response to treatment, examination of patient, ordering and review of laboratory studies, ordering and review of radiographic studies, ordering and performing treatments and interventions, pulse oximetry, re-evaluation of patient's condition and review of old charts  BP (!) 166/96   Pulse 95   Temp 98.1 F (36.7 C) (Oral)   Resp 18   Ht 5\' 9"  (1.753 m)   Wt 114 kg   SpO2 100%   BMI 37.11 kg/m   Results for orders placed or performed during the hospital encounter of 12/31/23  CBC   Collection Time: 12/31/23 10:11 AM  Result Value Ref Range   WBC 7.3 4.0 - 10.5 K/uL   RBC 4.53 4.22 - 5.81 MIL/uL   Hemoglobin 13.1 13.0 - 17.0 g/dL   HCT 16.1 09.6 - 04.5 %   MCV 91.6 80.0 - 100.0 fL   MCH 28.9 26.0 - 34.0 pg   MCHC 31.6 30.0 - 36.0 g/dL   RDW 40.9 81.1 - 91.4 %   Platelets 348 150 - 400 K/uL   nRBC 0.0 0.0 -  0.2 %  Comprehensive metabolic panel   Collection Time: 12/31/23 10:11 AM  Result Value Ref Range   Sodium 139 135 - 145 mmol/L   Potassium 3.3 (L) 3.5 - 5.1 mmol/L   Chloride 103 98 - 111 mmol/L   CO2 18 (L) 22 - 32 mmol/L   Glucose, Bld 67 (L) 70 - 99 mg/dL   BUN 16 6 - 20 mg/dL   Creatinine, Ser 1.61 0.61 - 1.24 mg/dL   Calcium 8.8 (L) 8.9 - 10.3 mg/dL   Total Protein 7.7 6.5 - 8.1 g/dL   Albumin 4.2 3.5 - 5.0 g/dL   AST 32 15 - 41 U/L    ALT 21 0 - 44 U/L   Alkaline Phosphatase 78 38 - 126 U/L   Total Bilirubin 0.8 0.0 - 1.2 mg/dL   GFR, Estimated >09 >60 mL/min   Anion gap 18 (H) 5 - 15  Lipase, blood   Collection Time: 12/31/23 10:11 AM  Result Value Ref Range   Lipase 63 (H) 11 - 51 U/L  Protime-INR   Collection Time: 12/31/23 10:11 AM  Result Value Ref Range   Prothrombin Time 13.3 11.4 - 15.2 seconds   INR 1.0 0.8 - 1.2  APTT   Collection Time: 12/31/23 10:11 AM  Result Value Ref Range   aPTT 33 24 - 36 seconds  Troponin I (High Sensitivity)   Collection Time: 12/31/23 10:11 AM  Result Value Ref Range   Troponin I (High Sensitivity) 5 <18 ng/L  Resp panel by RT-PCR (RSV, Flu A&B, Covid) Anterior Nasal Swab   Collection Time: 12/31/23 10:18 AM   Specimen: Anterior Nasal Swab  Result Value Ref Range   SARS Coronavirus 2 by RT PCR NEGATIVE NEGATIVE   Influenza A by PCR NEGATIVE NEGATIVE   Influenza B by PCR NEGATIVE NEGATIVE   Resp Syncytial Virus by PCR NEGATIVE NEGATIVE  Brain natriuretic peptide   Collection Time: 12/31/23 10:18 AM  Result Value Ref Range   B Natriuretic Peptide 28.9 0.0 - 100.0 pg/mL  Ethanol   Collection Time: 12/31/23 10:19 AM  Result Value Ref Range   Alcohol, Ethyl (B) 113 (H) <10 mg/dL  Acetaminophen level   Collection Time: 12/31/23 10:19 AM  Result Value Ref Range   Acetaminophen (Tylenol), Serum <10 (L) 10 - 30 ug/mL  Salicylate level   Collection Time: 12/31/23 10:19 AM  Result Value Ref Range   Salicylate Lvl <7.0 (L) 7.0 - 30.0 mg/dL  Ammonia   Collection Time: 12/31/23 10:25 AM  Result Value Ref Range   Ammonia 38 (H) 9 - 35 umol/L  Type and screen [facility]   Collection Time: 12/31/23 10:25 AM  Result Value Ref Range   ABO/RH(D) O POS    Antibody Screen NEG    Sample Expiration      01/03/2024,2359 Performed at [facility], [address], [place], [place] [address]   POC occult blood, ED   Collection Time:  12/31/23 10:26 AM  Result Value Ref Range   Fecal Occult Bld POSITIVE (A) NEGATIVE  Troponin I (High Sensitivity)   Collection Time: 12/31/23 12:18 PM  Result Value Ref Range   Troponin I (High Sensitivity) 5 <18 ng/L  CBG monitoring, ED   Collection Time: 12/31/23 12:29 PM  Result Value Ref Range   Glucose-Capillary 58 (L) 70 - 99 mg/dL  I-Stat CG4 Lactic Acid   Collection Time: 12/31/23  1:09 PM  Result Value Ref Range   Lactic  Acid, Venous 2.8 (HH) 0.5 - 1.9 mmol/L  CBG monitoring, ED   Collection Time: 12/31/23  1:25 PM  Result Value Ref Range   Glucose-Capillary 135 (H) 70 - 99 mg/dL  POC CBG, ED   Collection Time: 12/31/23  2:22 PM  Result Value Ref Range   Glucose-Capillary 105 (H) 70 - 99 mg/dL  Urinalysis, Routine w reflex microscopic -Urine, Clean Catch   Collection Time: 12/31/23  2:25 PM  Result Value Ref Range   Color, Urine YELLOW YELLOW   APPearance CLEAR CLEAR   Specific Gravity, Urine 1.021 1.005 - 1.030   pH 5.0 5.0 - 8.0   Glucose, UA >=500 (A) NEGATIVE mg/dL   Hgb urine dipstick SMALL (A) NEGATIVE   Bilirubin Urine NEGATIVE NEGATIVE   Ketones, ur 20 (A) NEGATIVE mg/dL   Protein, ur 981 (A) NEGATIVE mg/dL   Nitrite NEGATIVE NEGATIVE   Leukocytes,Ua NEGATIVE NEGATIVE   RBC / HPF 0-5 0 - 5 RBC/hpf   WBC, UA 0-5 0 - 5 WBC/hpf   Bacteria, UA NONE SEEN NONE SEEN   Squamous Epithelial / HPF 0-5 0 - 5 /HPF   Mucus PRESENT    Hyaline Casts, UA PRESENT   Rapid urine drug screen (hospital performed)   Collection Time: 12/31/23  2:25 PM  Result Value Ref Range   Opiates NONE DETECTED NONE DETECTED   Cocaine NONE DETECTED NONE DETECTED   Benzodiazepines NONE DETECTED NONE DETECTED   Amphetamines NONE DETECTED NONE DETECTED   Tetrahydrocannabinol POSITIVE (A) NONE DETECTED   Barbiturates NONE DETECTED NONE DETECTED  CBG monitoring, ED   Collection Time: 12/31/23  3:04 PM  Result Value Ref Range   Glucose-Capillary 64 (L) 70 - 99 mg/dL  CBG monitoring, ED    Collection Time: 12/31/23  4:14 PM  Result Value Ref Range   Glucose-Capillary 69 (L) 70 - 99 mg/dL  CBG monitoring, ED   Collection Time: 12/31/23  5:04 PM  Result Value Ref Range   Glucose-Capillary 66 (L) 70 - 99 mg/dL   CT Head Wo Contrast Result Date: 12/31/2023 CLINICAL DATA:  Provided history: Mental status change, unknown cause. EXAM: CT HEAD WITHOUT CONTRAST TECHNIQUE: Contiguous axial images were obtained from the base of the skull through the vertex without intravenous contrast. RADIATION DOSE REDUCTION: This exam was performed according to the departmental dose-optimization program which includes automated exposure control, adjustment of the mA and/or kV according to patient size and/or use of iterative reconstruction technique. COMPARISON:  Head CT 09/28/2022. FINDINGS: Streak/beam hardening artifact partially obscures the posterior fossa inferiorly. Within this limitation, findings are as follows. Brain: Mild generalized cerebral atrophy. There is no acute intracranial hemorrhage. No demarcated cortical infarct. No extra-axial fluid collection. No evidence of an intracranial mass. No midline shift. Vascular: No hyperdense vessel. Skull: No calvarial fracture or aggressive osseous lesion. Sinuses/Orbits: No mass or acute finding within the imaged orbits. No significant paranasal sinus disease at the imaged levels. IMPRESSION: Streak/beam hardening artifact partially obscures the posterior fossa inferiorly. Within this limitation, no evidence of an acute intracranial abnormality. Electronically Signed   By: Jackey Loge D.O.   On: 12/31/2023 16:35   DG Chest Portable 1 View Result Date: 12/31/2023 CLINICAL DATA:  Shortness of breath.  Chest pain. EXAM: PORTABLE CHEST 1 VIEW COMPARISON:  01/08/2023. FINDINGS: Low lung volume. Bilateral lung fields are clear. Bilateral costophrenic angles are clear. Normal cardio-mediastinal silhouette. No acute osseous abnormalities. The soft tissues are  within normal limits. IMPRESSION: No active disease. Electronically  Signed   By: Jules Schick M.D.   On: 12/31/2023 13:56       Fayrene Helper, PA-C 12/31/23 2208    Terald Sleeper, MD 01/01/24 [phone]

## 2023-12-31 NOTE — Assessment & Plan Note (Addendum)
 Glucose level between 50 and 60 at the lowest.  This is consistent with starvation that the patient and wife corroborate at home.  At this time I think pathologic hypoglycemia such as due to insulinoma or surreptitious oral hypoglycemic use is pretty unlikely.  We will monitor how the patient responds to dietary intake. Known diabetic - restart meds once patient glucose improves.

## 2023-12-31 NOTE — ED Notes (Signed)
 ED TO INPATIENT HANDOFF REPORT  Name/Age/Gender Jacob Adams 58 y.o. male  Code Status    Code Status Orders  (From admission, onward)           Start     Ordered   12/31/23 1814  Full code  Continuous       Question:  By:  Answer:  Consent: discussion documented in EHR   12/31/23 1814           Code Status History     Date Active Date Inactive Code Status Order ID Comments User Context   04/01/2022 1148 04/02/2022 2221 Full Code [id number]  Linwood Dibbles, MD ED   01/23/2022 0854 01/27/2022 1918 Full Code [id number]  Almon Hercules, MD ED   07/23/2020 1431 07/24/2020 2020 Full Code [id number]  Gertha Calkin, MD ED   07/09/2020 0712 07/10/2020 0140 Full Code [id number]  Lisbeth Renshaw, Sophia, PA-C ED   01/23/2020 1847 01/24/2020 1955 Full Code [id number]  Chevis Pretty, MD ED   11/30/2019 1225 12/01/2019 1414 Full Code [id number]  Fayrene Helper, PA-C ED   08/15/2019 2121 08/18/2019 2043 Full Code [id number]  Lorretta Harp, MD ED   02/20/2019 2036 02/23/2019 1645 Full Code [id number]  Hillary Bow, DO ED   02/08/2019 0810 02/10/2019 1551 Full Code [id number]  Jonah Blue, MD ED   02/08/2019 0442 02/08/2019 0810 Full Code [id number]  Nira Conn, MD ED   12/24/2018 2045 12/28/2018 1828 Full Code [id number]  Hillary Bow, DO ED   01/06/2016 2226 01/11/2016 1612 Full Code [id number]  Earney Navy, NP Inpatient   01/06/2016 2226 01/06/2016 2226 Full Code [id number]  Earney Navy, NP Inpatient   01/04/2016 1841 01/06/2016 2226 Full Code [id number]  Charlestine Night, PA-C ED   11/03/2015 0859 11/05/2015 1649 Full Code [id number]  Sanjuana Kava, NP Inpatient   11/02/2015 1858 11/03/2015 0859 Full Code [id number]  Earney Navy, NP Inpatient   10/31/2015 1138 11/02/2015 1858 Full Code [id number]  Dorothea Ogle, MD Inpatient   10/30/2015 1130 10/31/2015 0916 Full Code [id number]  Earney Navy, NP Inpatient   10/29/2015 2331 10/30/2015 1130 Full Code [id number]  Antony Madura, PA-C ED    07/26/2014 2359 07/28/2014 2245 Full Code [id number]  Leona Singleton, MD Inpatient   08/26/2013 1602 09/01/2013 1713 Full Code [id number]  Earney Navy, NP Inpatient   08/25/2013 1848 08/26/2013 1602 Full Code [id number]  [facility]       Home/SNF/Other Home  Chief Complaint Suicidal behavior [R45.89]  Level of Care/Admitting Diagnosis ED Disposition     ED Disposition  Admit   Condition  --   Comment  Hospital Area: [facility] [[id number]]  Level of Care: Med-Surg [16]  May admit patient to [facility] or [facility] if equivalent level of care is available:: No  Covid Evaluation: Asymptomatic - no recent exposure (last 10 days) testing not required  Diagnosis: Suicidal behavior [215004]  Admitting Physician: Nolberto Hanlon [[id number]]  Attending Physician: Nolberto Hanlon [[id number]]  Certification:: I certify this patient will need inpatient services for at least 2 midnights  Expected Medical Readiness: 01/02/2024          Medical History Past Medical History:  Diagnosis Date   Acid reflux    Alcohol withdrawal (HCC) 01/2019   Anxiety    Arthritis    "toes" (07/26/2014)   Bipolar disorder (HCC)    Depression    Headache(784.0)    "weekly" (07/26/2014)  History of blood transfusion ~ 2000   "related to nose bleeding"   History of stomach ulcers    Hypertension    Lower GI bleeding admitted 07/26/2014   Mental disorder    Migraine    "@ least monthly" (07/26/2014)   Pancreatitis    Rectal bleeding 07/26/2014   Sleep apnea    "haven't been RX'd mask yet" (07/26/2014)    Allergies Allergies  Allergen Reactions   Uncoded Nonscreenable Allergen Swelling and Other (See Comments)    Sun tan lotion: swelling and peeling   Nsaids Other (See Comments), Swelling and Rash    "Discomfort of stomach," also  Pt reports he cannot take    "Discomfort of stomach," also   Ibuprofen Nausea Only, Swelling and Other (See Comments)    "Discomfort  of stomach," also   Pork (Diagnostic) Other (See Comments)   Penicillins Itching, Swelling and Rash    Has patient had a PCN reaction causing immediate rash, facial/tongue/throat swelling, SOB or lightheadedness with hypotension: yes Has patient had a PCN reaction causing severe rash involving mucus membranes or skin necrosis: no Has patient had a PCN reaction that required hospitalization yes, happened while hospitalized Has patient had a PCN reaction occurring within the last 10 years: yes If all of the above answers are "NO", then may proceed with Cephalosporin use.    [name] Other (See Comments)    DOESN'T EAT PORK- patient preference    IV Location/Drains/Wounds Patient Lines/Drains/Airways Status     Active Line/Drains/Airways     Name Placement date Placement time Site Days   Peripheral IV 12/31/23 20 G Anterior;Distal;Left;Upper Arm 12/31/23  1100  Arm  less than 1            Labs/Imaging Results for orders placed or performed during the hospital encounter of 12/31/23 (from the past 48 hours)  Troponin I (High Sensitivity)     Status: None   Collection Time: 12/31/23 10:11 AM  Result Value Ref Range   Troponin I (High Sensitivity) 5 <18 ng/L    Comment: (NOTE) Elevated high sensitivity troponin I (hsTnI) values and significant  changes across serial measurements may suggest ACS but many other  chronic and acute conditions are known to elevate hsTnI results.  Refer to the "Links" section for chest pain algorithms and additional  guidance. Performed at [facility], [address], [place], [place] [address]   CBC     Status: None   Collection Time: 12/31/23 10:11 AM  Result Value Ref Range   WBC 7.3 4.0 - 10.5 K/uL   RBC 4.53 4.22 - 5.81 MIL/uL   Hemoglobin 13.1 13.0 - 17.0 g/dL   HCT 72.5 36.6 - 44.0 %   MCV 91.6 80.0 - 100.0 fL   MCH 28.9 26.0 - 34.0 pg   MCHC 31.6 30.0 - 36.0 g/dL   RDW 34.7 42.5 - 95.6 %   Platelets 348  150 - 400 K/uL   nRBC 0.0 0.0 - 0.2 %    Comment: Performed at [facility], [address], [place], [place] [address]  Comprehensive metabolic panel     Status: Abnormal   Collection Time: 12/31/23 10:11 AM  Result Value Ref Range   Sodium 139 135 - 145 mmol/L   Potassium 3.3 (L) 3.5 - 5.1 mmol/L   Chloride 103 98 - 111 mmol/L   CO2 18 (L) 22 - 32 mmol/L   Glucose, Bld 67 (L) 70 - 99 mg/dL  Comment: Glucose reference range applies only to samples taken after fasting for at least 8 hours.   BUN 16 6 - 20 mg/dL   Creatinine, Ser 1.61 0.61 - 1.24 mg/dL   Calcium 8.8 (L) 8.9 - 10.3 mg/dL   Total Protein 7.7 6.5 - 8.1 g/dL   Albumin 4.2 3.5 - 5.0 g/dL   AST 32 15 - 41 U/L   ALT 21 0 - 44 U/L   Alkaline Phosphatase 78 38 - 126 U/L   Total Bilirubin 0.8 0.0 - 1.2 mg/dL   GFR, Estimated >09 >60 mL/min    Comment: (NOTE) Calculated using the CKD-EPI Creatinine Equation (2021)    Anion gap 18 (H) 5 - 15    Comment: Performed at [facility], [address], [place], [place] [address]  Lipase, blood     Status: Abnormal   Collection Time: 12/31/23 10:11 AM  Result Value Ref Range   Lipase 63 (H) 11 - 51 U/L    Comment: Performed at [facility], [address], [place], [place] [address]  Protime-INR     Status: None   Collection Time: 12/31/23 10:11 AM  Result Value Ref Range   Prothrombin Time 13.3 11.4 - 15.2 seconds   INR 1.0 0.8 - 1.2    Comment: (NOTE) INR goal varies based on device and disease states. Performed at [facility], [address], [place], [place] [address]   APTT     Status: None   Collection Time: 12/31/23 10:11 AM  Result Value Ref Range   aPTT 33 24 - 36 seconds    Comment: Performed at [facility], [address], [place], [place] [address]  Resp panel by RT-PCR (RSV, Flu A&B, Covid) Anterior Nasal Swab     Status: None   Collection Time: 12/31/23 10:18 AM    Specimen: Anterior Nasal Swab  Result Value Ref Range   SARS Coronavirus 2 by RT PCR NEGATIVE NEGATIVE    Comment: (NOTE) SARS-CoV-2 target nucleic acids are NOT DETECTED.  The SARS-CoV-2 RNA is generally detectable in upper respiratory specimens during the acute phase of infection. The lowest concentration of SARS-CoV-2 viral copies this assay can detect is 138 copies/mL. A negative result does not preclude SARS-Cov-2 infection and should not be used as the sole basis for treatment or other patient management decisions. A negative result may occur with  improper specimen collection/handling, submission of specimen other than nasopharyngeal swab, presence of viral mutation(s) within the areas targeted by this assay, and inadequate number of viral copies(<138 copies/mL). A negative result must be combined with clinical observations, patient history, and epidemiological information. The expected result is Negative.  Fact Sheet for Patients:  BloggerCourse.com  Fact Sheet for Healthcare Providers:  SeriousBroker.it  This test is no t yet approved or cleared by the [place] FDA and  has been authorized for detection and/or diagnosis of SARS-CoV-2 by FDA under an Emergency Use Authorization (EUA). This EUA will remain  in effect (meaning this test can be used) for the duration of the COVID-19 declaration under Section 564(b)(1) of the Act, 21 U.S.C.section 360bbb-3(b)(1), unless the authorization is terminated  or revoked sooner.       Influenza A by PCR NEGATIVE NEGATIVE   Influenza B by PCR NEGATIVE NEGATIVE    Comment: (NOTE) The Xpert Xpress SARS-CoV-2/FLU/RSV plus assay is intended as an aid in the diagnosis of influenza from Nasopharyngeal swab specimens and should not be used  as a sole basis for treatment. Nasal washings and aspirates are unacceptable for Xpert Xpress SARS-CoV-2/FLU/RSV testing.  Fact Sheet for  Patients: BloggerCourse.com  Fact Sheet for Healthcare Providers: SeriousBroker.it  This test is not yet approved or cleared by the [place] FDA and has been authorized for detection and/or diagnosis of SARS-CoV-2 by FDA under an Emergency Use Authorization (EUA). This EUA will remain in effect (meaning this test can be used) for the duration of the COVID-19 declaration under Section 564(b)(1) of the Act, 21 U.S.C. section 360bbb-3(b)(1), unless the authorization is terminated or revoked.     Resp Syncytial Virus by PCR NEGATIVE NEGATIVE    Comment: (NOTE) Fact Sheet for Patients: BloggerCourse.com  Fact Sheet for Healthcare Providers: SeriousBroker.it  This test is not yet approved or cleared by the [place] FDA and has been authorized for detection and/or diagnosis of SARS-CoV-2 by FDA under an Emergency Use Authorization (EUA). This EUA will remain in effect (meaning this test can be used) for the duration of the COVID-19 declaration under Section 564(b)(1) of the Act, 21 U.S.C. section 360bbb-3(b)(1), unless the authorization is terminated or revoked.  Performed at [facility], [address] [place], [place] [address]   Brain natriuretic peptide     Status: None   Collection Time: 12/31/23 10:18 AM  Result Value Ref Range   B Natriuretic Peptide 28.9 0.0 - 100.0 pg/mL    Comment: Performed at [facility], [address], [place], [place] [address]  Ethanol     Status: Abnormal   Collection Time: 12/31/23 10:19 AM  Result Value Ref Range   Alcohol, Ethyl (B) 113 (H) <10 mg/dL    Comment: (NOTE) Lowest detectable limit for serum alcohol is 10 mg/dL.  For medical purposes only. Performed at [facility], [address] [place], [place] [address]   Acetaminophen level     Status: Abnormal    Collection Time: 12/31/23 10:19 AM  Result Value Ref Range   Acetaminophen (Tylenol), Serum <10 (L) 10 - 30 ug/mL    Comment: (NOTE) Therapeutic concentrations vary significantly. A range of 10-30 ug/mL  may be an effective concentration for many patients. However, some  are best treated at concentrations outside of this range. Acetaminophen concentrations >150 ug/mL at 4 hours after ingestion  and >50 ug/mL at 12 hours after ingestion are often associated with  toxic reactions.  Performed at [facility], [address], [place], [place] [address]   Salicylate level     Status: Abnormal   Collection Time: 12/31/23 10:19 AM  Result Value Ref Range   Salicylate Lvl <7.0 (L) 7.0 - 30.0 mg/dL    Comment: HEMOLYSIS AT THIS LEVEL MAY AFFECT RESULT Performed at [facility], [address] [place], [place] [address]   Ammonia     Status: Abnormal   Collection Time: 12/31/23 10:25 AM  Result Value Ref Range   Ammonia 38 (H) 9 - 35 umol/L    Comment: Performed at [facility], [address], [place], [place] [address]  Type and screen [facility]     Status: None   Collection Time: 12/31/23 10:25 AM  Result Value Ref Range   ABO/RH(D) O POS    Antibody Screen NEG    Sample Expiration      01/03/2024,2359 Performed at [facility], [address], [place], [place] [address]   POC occult blood, ED  Status: Abnormal   Collection Time: 12/31/23 10:26 AM  Result Value Ref Range   Fecal Occult Bld POSITIVE (A) NEGATIVE  Troponin I (High Sensitivity)     Status: None   Collection Time: 12/31/23 12:18 PM  Result Value Ref Range   Troponin I (High Sensitivity) 5 <18 ng/L    Comment: (NOTE) Elevated high sensitivity troponin I (hsTnI) values and significant  changes across serial measurements may suggest ACS but many other  chronic and acute conditions are known to elevate hsTnI results.   Refer to the "Links" section for chest pain algorithms and additional  guidance. Performed at [facility], [address] [place], [place] [address]   CBG monitoring, ED     Status: Abnormal   Collection Time: 12/31/23 12:29 PM  Result Value Ref Range   Glucose-Capillary 58 (L) 70 - 99 mg/dL    Comment: Glucose reference range applies only to samples taken after fasting for at least 8 hours.  I-Stat CG4 Lactic Acid     Status: Abnormal   Collection Time: 12/31/23  1:09 PM  Result Value Ref Range   Lactic Acid, Venous 2.8 (HH) 0.5 - 1.9 mmol/L  CBG monitoring, ED     Status: Abnormal   Collection Time: 12/31/23  1:25 PM  Result Value Ref Range   Glucose-Capillary 135 (H) 70 - 99 mg/dL    Comment: Glucose reference range applies only to samples taken after fasting for at least 8 hours.  POC CBG, ED     Status: Abnormal   Collection Time: 12/31/23  2:22 PM  Result Value Ref Range   Glucose-Capillary 105 (H) 70 - 99 mg/dL    Comment: Glucose reference range applies only to samples taken after fasting for at least 8 hours.  Urinalysis, Routine w reflex microscopic -Urine, Clean Catch     Status: Abnormal   Collection Time: 12/31/23  2:25 PM  Result Value Ref Range   Color, Urine YELLOW YELLOW   APPearance CLEAR CLEAR   Specific Gravity, Urine 1.021 1.005 - 1.030   pH 5.0 5.0 - 8.0   Glucose, UA >=500 (A) NEGATIVE mg/dL   Hgb urine dipstick SMALL (A) NEGATIVE   Bilirubin Urine NEGATIVE NEGATIVE   Ketones, ur 20 (A) NEGATIVE mg/dL   Protein, ur 621 (A) NEGATIVE mg/dL   Nitrite NEGATIVE NEGATIVE   Leukocytes,Ua NEGATIVE NEGATIVE   RBC / HPF 0-5 0 - 5 RBC/hpf   WBC, UA 0-5 0 - 5 WBC/hpf   Bacteria, UA NONE SEEN NONE SEEN   Squamous Epithelial / HPF 0-5 0 - 5 /HPF   Mucus PRESENT    Hyaline Casts, UA PRESENT     Comment: Performed at [facility], [address], [place], [place] [address]  Rapid urine drug screen (hospital performed)      Status: Abnormal   Collection Time: 12/31/23  2:25 PM  Result Value Ref Range   Opiates NONE DETECTED NONE DETECTED   Cocaine NONE DETECTED NONE DETECTED   Benzodiazepines NONE DETECTED NONE DETECTED   Amphetamines NONE DETECTED NONE DETECTED   Tetrahydrocannabinol POSITIVE (A) NONE DETECTED   Barbiturates NONE DETECTED NONE DETECTED    Comment: (NOTE) DRUG SCREEN FOR MEDICAL PURPOSES ONLY.  IF CONFIRMATION IS NEEDED FOR ANY PURPOSE, NOTIFY LAB WITHIN 5 DAYS.  LOWEST DETECTABLE LIMITS FOR URINE DRUG SCREEN Drug Class                     Cutoff (  ng/mL) Amphetamine and metabolites    1000 Barbiturate and metabolites    200 Benzodiazepine                 200 Opiates and metabolites        300 Cocaine and metabolites        300 THC                            50 Performed at [facility], [address] [place], [place] [address]   CBG monitoring, ED     Status: Abnormal   Collection Time: 12/31/23  3:04 PM  Result Value Ref Range   Glucose-Capillary 64 (L) 70 - 99 mg/dL    Comment: Glucose reference range applies only to samples taken after fasting for at least 8 hours.  CBG monitoring, ED     Status: Abnormal   Collection Time: 12/31/23  4:14 PM  Result Value Ref Range   Glucose-Capillary 69 (L) 70 - 99 mg/dL    Comment: Glucose reference range applies only to samples taken after fasting for at least 8 hours.  CBG monitoring, ED     Status: Abnormal   Collection Time: 12/31/23  5:04 PM  Result Value Ref Range   Glucose-Capillary 66 (L) 70 - 99 mg/dL    Comment: Glucose reference range applies only to samples taken after fasting for at least 8 hours.  CBG monitoring, ED     Status: None   Collection Time: 12/31/23  6:08 PM  Result Value Ref Range   Glucose-Capillary 86 70 - 99 mg/dL    Comment: Glucose reference range applies only to samples taken after fasting for at least 8 hours.   CT Head Wo Contrast Result Date: 12/31/2023 CLINICAL DATA:  Provided  history: Mental status change, unknown cause. EXAM: CT HEAD WITHOUT CONTRAST TECHNIQUE: Contiguous axial images were obtained from the base of the skull through the vertex without intravenous contrast. RADIATION DOSE REDUCTION: This exam was performed according to the departmental dose-optimization program which includes automated exposure control, adjustment of the mA and/or kV according to patient size and/or use of iterative reconstruction technique. COMPARISON:  Head CT 09/28/2022. FINDINGS: Streak/beam hardening artifact partially obscures the posterior fossa inferiorly. Within this limitation, findings are as follows. Brain: Mild generalized cerebral atrophy. There is no acute intracranial hemorrhage. No demarcated cortical infarct. No extra-axial fluid collection. No evidence of an intracranial mass. No midline shift. Vascular: No hyperdense vessel. Skull: No calvarial fracture or aggressive osseous lesion. Sinuses/Orbits: No mass or acute finding within the imaged orbits. No significant paranasal sinus disease at the imaged levels. IMPRESSION: Streak/beam hardening artifact partially obscures the posterior fossa inferiorly. Within this limitation, no evidence of an acute intracranial abnormality. Electronically Signed   By: Jackey Loge D.O.   On: 12/31/2023 16:35   DG Chest Portable 1 View Result Date: 12/31/2023 CLINICAL DATA:  Shortness of breath.  Chest pain. EXAM: PORTABLE CHEST 1 VIEW COMPARISON:  01/08/2023. FINDINGS: Low lung volume. Bilateral lung fields are clear. Bilateral costophrenic angles are clear. Normal cardio-mediastinal silhouette. No acute osseous abnormalities. The soft tissues are within normal limits. IMPRESSION: No active disease. Electronically Signed   By: Jules Schick M.D.   On: 12/31/2023 13:56    Pending Labs Unresulted Labs (From admission, onward)     Start     Ordered   01/01/24 0500  APTT  Tomorrow morning,   R  12/31/23 1814   01/01/24 0500  Protime-INR   Tomorrow morning,   R        12/31/23 1814   01/01/24 0500  Basic metabolic panel  Tomorrow morning,   R        12/31/23 1814   01/01/24 0500  CBC  Tomorrow morning,   R        12/31/23 1814   12/31/23 1813  HIV Antibody (routine testing w rflx)  (HIV Antibody (Routine testing w reflex) panel)  Once,   R        12/31/23 1814            Vitals/Pain Today's Vitals   12/31/23 1630 12/31/23 1700 12/31/23 1730 12/31/23 1800  BP: (!) 154/103 (!) 166/96 (!) 168/102 (!) 174/112  Pulse: 88 95 87 92  Resp:      Temp:      TempSrc:      SpO2: 100% 100% 100% 100%  Weight:      Height:      PainSc:        Isolation Precautions No active isolations  Medications Medications  dextrose 5 % solution ( Intravenous New Bag/Given 12/31/23 1537)  pantoprazole (PROTONIX) injection 40 mg (has no administration in time range)  naltrexone (DEPADE) tablet 50 mg (has no administration in time range)  atorvastatin (LIPITOR) tablet 80 mg (has no administration in time range)  carvedilol (COREG) tablet 6.25 mg (has no administration in time range)  sacubitril-valsartan (ENTRESTO) 49-51 mg per tablet (has no administration in time range)  spironolactone (ALDACTONE) tablet 25 mg (25 mg Oral Given 12/31/23 1839)  thiamine (VITAMIN B1) tablet 100 mg (100 mg Oral Given 12/31/23 1839)  acetaminophen (TYLENOL) tablet 650 mg (has no administration in time range)    Or  acetaminophen (TYLENOL) suppository 650 mg (has no administration in time range)  polyethylene glycol (MIRALAX / GLYCOLAX) packet 17 g (has no administration in time range)  sodium chloride flush (NS) 0.9 % injection 3 mL (has no administration in time range)  dextrose (GLUTOSE) oral gel 40% (peds > 20kg and adults) (31 g Oral Given 12/31/23 1211)  dextrose 50 % solution 50 mL (50 mLs Intravenous Given 12/31/23 1244)  thiamine (VITAMIN B1) injection 100 mg (100 mg Intravenous Given 12/31/23 1243)  sodium chloride 0.9 % bolus 1,000 mL (0 mLs  Intravenous Stopped 12/31/23 1541)  pantoprazole (PROTONIX) injection 40 mg (40 mg Intravenous Given 12/31/23 1328)  potassium chloride (KLOR-CON) packet 40 mEq (40 mEq Oral Given 12/31/23 1839)    Mobility walks

## 2023-12-31 NOTE — ED Provider Notes (Signed)
 [facility] Provider Note   CSN: [id number] Arrival date & time: 12/31/23  0945     History  Chief Complaint  Patient presents with   Chest Pain   Back Pain   Dizziness   Suicidal    OSA CAMPOLI is a 58 y.o. male with significant past medical history of hypertension, alcohol use disorder, bipolar disorder, CHF, tobacco dependence, polysubstance abuse who presents with concern for multiple complaints.  Currently intoxicated per wife, he endorses shortness of breath, chest pain, he reports increase in rectal bleeding.  He reports hearing voices, worsening schizophrenia, reports that voices are telling him to hurt himself.  He denies wanting to hurt anybody else.   Chest Pain Associated symptoms: back pain and dizziness   Back Pain Associated symptoms: chest pain   Dizziness Associated symptoms: chest pain        Home Medications Prior to Admission medications   Medication Sig Start Date End Date Taking? Authorizing Provider  amitriptyline (ELAVIL) 50 MG tablet Take 50 mg by mouth at bedtime. 10/23/22   [provider]  ammonium lactate (AMLACTIN) 12 % cream Apply 1 Application topically as needed for dry skin. 05/19/23   Vivi Barrack, DPM  ARIPiprazole (ABILIFY) 10 MG tablet Take 5 mg by mouth daily.    [provider]  atorvastatin (LIPITOR) 80 MG tablet Take 40 mg by mouth at bedtime.     [provider]  carvedilol (COREG) 12.5 MG tablet Take 12.5 mg by mouth 2 (two) times daily with a meal.    [provider]  chlordiazePOXIDE (LIBRIUM) 25 MG capsule 50mg  PO TID x 1D, then 25-50mg  PO BID X 1D, then 25-50mg  PO QD X 1D 03/25/22   Frances Ambrosino H, PA-C  ciclopirox (PENLAC) 8 % solution Apply topically at bedtime. Apply over nail and surrounding skin. Apply daily over previous coat. After seven (7) days, may remove with alcohol and continue cycle. 05/19/23   Vivi Barrack, DPM   ENTRESTO 49-51 MG Take 1 tablet by mouth 2 (two) times daily.    [provider]  famotidine (PEPCID) 20 MG tablet Take 1 tablet (20 mg total) by mouth 2 (two) times daily. 10/14/22   Arby Barrette, MD  famotidine (PEPCID) 20 MG tablet Take 1 tablet by mouth 2 (two) times daily. 10/14/22   [provider]  FLUoxetine (PROZAC) 20 MG capsule Take 20 mg by mouth daily.    [provider]  folic acid (FOLVITE) 1 MG tablet Take 1 mg by mouth daily.    [provider]  Galcanezumab-gnlm (EMGALITY) 120 MG/ML SOAJ Inject 1 Pen into the skin every 30 (thirty) days. 11/06/22   Ocie Doyne, MD  HYDROcodone-acetaminophen (NORCO/VICODIN) 5-325 MG tablet Take 1 tablet by mouth every 6 (six) hours as needed for moderate pain or severe pain. 10/14/22   Arby Barrette, MD  hydrocortisone (ANUSOL-HC) 2.5 % rectal cream Place 1 Application rectally 2 (two) times daily. 10/21/22   Meredith Pel, NP  hydrocortisone (ANUSOL-HC) 25 MG suppository Place 1 suppository (25 mg total) rectally at bedtime. Nightly for 1 week and then every other night until Rx is complete.  If issues in future, then may use for 3 nights in a row. 05/13/22   Mansouraty, Netty Starring., MD  hydrOXYzine (ATARAX) 10 MG tablet Take 10 mg by mouth 3 (three) times daily as needed for anxiety.    [provider]  lactulose (CHRONULAC) 10  GM/15ML solution Take 45 mLs (30 g total) by mouth 3 (three) times daily. 07/24/20   Burnadette Pop, MD  latanoprost (XALATAN) 0.005 % ophthalmic solution Place 1 drop into both eyes at bedtime.    [provider]  levothyroxine (SYNTHROID) 88 MCG tablet Take 88 mcg by mouth daily before breakfast.    [provider]  lidocaine (XYLOCAINE) 5 % ointment Apply 1 application  topically 2 (two) times daily as needed (knee and joint pain).    [provider]  mesalamine (LIALDA) 1.2 g EC tablet Take 4 tablets (4.8 g total) by mouth daily. 04/24/22  05/24/22  Mansouraty, Netty Starring., MD  methocarbamol (ROBAXIN) 500 MG tablet Take 1 tablet (500 mg total) by mouth 2 (two) times daily. 08/19/22   Theron Arista, PA-C  methocarbamol (ROBAXIN-750) 750 MG tablet Take 1 tablet (750 mg total) by mouth every 8 (eight) hours as needed for muscle spasms. 10/14/22   Arby Barrette, MD  mirtazapine (REMERON) 7.5 MG tablet Take 7.5 mg by mouth at bedtime.    [provider]  Multiple Vitamin (MULTIVITAMIN WITH MINERALS) TABS tablet Take 1 tablet by mouth daily.    [provider]  naltrexone (DEPADE) 50 MG tablet Take 50 mg by mouth daily. 07/08/22   [provider]  ondansetron (ZOFRAN) 4 MG tablet Take 1 tablet (4 mg total) by mouth every 6 (six) hours. Patient taking differently: Take 4 mg by mouth every 8 (eight) hours as needed for vomiting or nausea. 03/25/22   Anjel Pardo H, PA-C  pantoprazole (PROTONIX) 40 MG tablet Take 1 tablet (40 mg total) by mouth daily for 14 days. 10/14/22 10/28/22  Arby Barrette, MD  pantoprazole (PROTONIX) 40 MG tablet Take 40 mg by mouth daily. 10/23/22 01/21/23  [provider]  prazosin (MINIPRESS) 1 MG capsule Take 1 mg by mouth 2 (two) times daily.    [provider]  prazosin (MINIPRESS) 2 MG capsule Take 1 capsule (2 mg total) by mouth at bedtime. 01/27/22   Joseph Art, DO  prazosin (MINIPRESS) 2 MG capsule Take 2 mg by mouth 2 (two) times daily. 10/23/22 01/21/23  [provider]  propranolol (INDERAL) 60 MG tablet Take 60 mg by mouth 2 (two) times daily. 08/11/22   [provider]  rifaximin (XIFAXAN) 550 MG TABS tablet Take 1 tablet (550 mg total) by mouth 2 (two) times daily. 07/24/20   Burnadette Pop, MD  thiamine 100 MG tablet Take 1 tablet (100 mg total) by mouth daily. 07/24/20   Burnadette Pop, MD  Ubrogepant (UBRELVY) 50 MG TABS Take 50 mg by mouth as needed. 11/06/22   Ocie Doyne, MD      Allergies    Uncoded nonscreenable allergen, Nsaids,  Ibuprofen, Pork (diagnostic), Penicillins, and [name]    Review of Systems   Review of Systems  Cardiovascular:  Positive for chest pain.  Musculoskeletal:  Positive for back pain.  Neurological:  Positive for dizziness.  All other systems reviewed and are negative.   Physical Exam Updated Vital Signs BP 136/80 (BP Location: Right Arm)   Pulse 93   Temp 98.1 F (36.7 C) (Oral)   Resp 18   Ht 5\' 9"  (1.753 m)   Wt 114 kg   SpO2 100%   BMI 37.11 kg/m  Physical Exam Vitals and nursing note reviewed.  Constitutional:      General: He is not in acute distress.    Appearance: Normal appearance.  HENT:  Head: Normocephalic and atraumatic.  Eyes:     General: Scleral icterus present.        Right eye: No discharge.        Left eye: No discharge.  Cardiovascular:     Rate and Rhythm: Normal rate and regular rhythm.     Heart sounds: No murmur heard.    No friction rub. No gallop.  Pulmonary:     Effort: Pulmonary effort is normal.     Breath sounds: Normal breath sounds.     Comments: Scattered rhonchi throughout lung fields Abdominal:     General: Bowel sounds are normal.     Palpations: Abdomen is soft.     Comments: Some focal tenderness to palpation worse in the epigastric region  Genitourinary:    Comments: He has some bright red blood in the rectal vault but no active bleeding, just red tinge stool.  Multiple external hemorrhoids visualized. Skin:    General: Skin is warm and dry.     Capillary Refill: Capillary refill takes less than 2 seconds.  Neurological:     Mental Status: He is alert and oriented to person, place, and time.  Psychiatric:        Mood and Affect: Mood normal.        Behavior: Behavior normal.     Comments: Endorsing passive SI     ED Results / Procedures / Treatments   Labs (all labs ordered are listed, but only abnormal results are displayed) Labs Reviewed  COMPREHENSIVE METABOLIC PANEL - Abnormal; Notable for the  following components:      Result Value   Potassium 3.3 (*)    CO2 18 (*)    Glucose, Bld 67 (*)    Calcium 8.8 (*)    Anion gap 18 (*)    All other components within normal limits  AMMONIA - Abnormal; Notable for the following components:   Ammonia 38 (*)    All other components within normal limits  LIPASE, BLOOD - Abnormal; Notable for the following components:   Lipase 63 (*)    All other components within normal limits  RAPID URINE DRUG SCREEN, HOSP PERFORMED - Abnormal; Notable for the following components:   Tetrahydrocannabinol POSITIVE (*)    All other components within normal limits  POC OCCULT BLOOD, ED - Abnormal; Notable for the following components:   Fecal Occult Bld POSITIVE (*)    All other components within normal limits  CBG MONITORING, ED - Abnormal; Notable for the following components:   Glucose-Capillary 58 (*)    All other components within normal limits  I-STAT CG4 LACTIC ACID, ED - Abnormal; Notable for the following components:   Lactic Acid, Venous 2.8 (*)    All other components within normal limits  CBG MONITORING, ED - Abnormal; Notable for the following components:   Glucose-Capillary 135 (*)    All other components within normal limits  CBG MONITORING, ED - Abnormal; Notable for the following components:   Glucose-Capillary 105 (*)    All other components within normal limits  CBG MONITORING, ED - Abnormal; Notable for the following components:   Glucose-Capillary 64 (*)    All other components within normal limits  RESP PANEL BY RT-PCR (RSV, FLU A&B, COVID)  RVPGX2  CBC  PROTIME-INR  APTT  BRAIN NATRIURETIC PEPTIDE  URINALYSIS, ROUTINE W REFLEX MICROSCOPIC  ETHANOL  ACETAMINOPHEN LEVEL  SALICYLATE LEVEL  I-STAT CG4 LACTIC ACID, ED  TYPE AND SCREEN  TROPONIN I (HIGH SENSITIVITY)  TROPONIN I (HIGH SENSITIVITY)    EKG EKG Interpretation Date/Time:  Thursday December 31 2023 09:52:42 EDT Ventricular Rate:  97 PR Interval:  181 QRS  Duration:  95 QT Interval:  359 QTC Calculation: 456 R Axis:   -55  Text Interpretation: Sinus tachycardia Ventricular trigeminy Left anterior fascicular block Abnormal R-wave progression, late transition Confirmed by Alvester Chou [phone]) on 12/31/2023 3:13:46 PM  Radiology DG Chest Portable 1 View Result Date: 12/31/2023 CLINICAL DATA:  Shortness of breath.  Chest pain. EXAM: PORTABLE CHEST 1 VIEW COMPARISON:  01/08/2023. FINDINGS: Low lung volume. Bilateral lung fields are clear. Bilateral costophrenic angles are clear. Normal cardio-mediastinal silhouette. No acute osseous abnormalities. The soft tissues are within normal limits. IMPRESSION: No active disease. Electronically Signed   By: Jules Schick M.D.   On: 12/31/2023 13:56    Procedures Procedures    Medications Ordered in ED Medications  dextrose 5 % solution (has no administration in time range)  dextrose (GLUTOSE) oral gel 40% (peds > 20kg and adults) (31 g Oral Given 12/31/23 1211)  dextrose 50 % solution 50 mL (50 mLs Intravenous Given 12/31/23 1244)  thiamine (VITAMIN B1) injection 100 mg (100 mg Intravenous Given 12/31/23 1243)  sodium chloride 0.9 % bolus 1,000 mL (1,000 mLs Intravenous New Bag/Given 12/31/23 1328)  pantoprazole (PROTONIX) injection 40 mg (40 mg Intravenous Given 12/31/23 1328)    ED Course/ Medical Decision Making/ A&P Clinical Course as of 12/31/23 1521  Thu Dec 31, 2023  1313 Lactic 2.8 [CP]    Clinical Course User Index [CP] Olene Floss, PA-C                                 Medical Decision Making Amount and/or Complexity of Data Reviewed Labs: ordered. Radiology: ordered.  Risk OTC drugs. Prescription drug management.   This patient is a 58 y.o. male who presents to the ED for concern of chest pain, rectal bleeding, abdominal pain, sucidial thoughts, this involves an extensive number of treatment options, and is a complaint that carries with it a high risk of complications  and morbidity. The emergent differential diagnosis prior to evaluation includes, but is not limited to,  ACS, AAS, PE, Mallory-Weiss, Boerhaave's, Pneumonia, acute bronchitis, asthma or COPD exacerbation, anxiety, MSK pain or traumatic injury to the chest, acid reflux versus other, The causes of generalized abdominal pain include but are not limited to AAA, mesenteric ischemia, appendicitis, diverticulitis, DKA, gastritis, gastroenteritis, AMI, nephrolithiasis, pancreatitis, peritonitis, adrenal insufficiency,lead poisoning, iron toxicity, intestinal ischemia, constipation, UTI,SBO/LBO, splenic rupture, biliary disease, IBD, IBS, PUD, or hepatitis . This is not an exhaustive differential.   Past Medical History / Co-morbidities / Social History: hypertension, alcohol use disorder, bipolar disorder, CHF, tobacco dependence, polysubstance abuse  Additional history: Chart reviewed. Pertinent results include: Reviewed lab work, imaging from previous emergency department visits, outpatient family medicine, general surgery visits  Physical Exam: Physical exam performed. The pertinent findings include: Elevated blood pressure, 153/94, vital signs otherwise stable.  He has focal tenderness in epigastric region.  He has some rhonchi throughout lung fields.  He is ill-appearing but nontoxic, nonseptic appearing.  Lab Tests: I ordered, and personally interpreted labs.  The pertinent results include: CBC overall unremarkable, CMP notable for hypokalemia potassium 3.3, he has a bicarb deficit with CO2 of 18, anion gap of 18 which is likely secondary to accommodation of alcoholic acidosis and dehydration/ketoacidosis, also considered uremia induced.  He  is initially hypoglycemic, glucose of 67, with repeat at 58.  Administered oral glucose and then D50 after glucose continue to drop, improved to 121, and then starting to lower again to 105 on recheck.  Normal troponin, mildly elevated ammonia of 38, his Hemoccult is  positive but his hemoglobin is stable, no large volume losses from the rectum.  Lipase very mildly elevated but not greater than 3 times the upper limit of normal.  Normal BNP.  His initial lactic acid is elevated at 2.8 again likely secondary to his alcohol abuse, and moderate dehydration, we will gently rehydrate in context of his known heart failure.   Imaging Studies: I ordered imaging studies including plain film chest x-ray, CT head. I independently visualized and interpreted imaging which showed no evidence of acute stroke or other abnormality. Radiology interpretation of CT head is pending at this is. . I agree with the radiologist interpretation.   Cardiac Monitoring:  The patient was maintained on a cardiac monitor.  My attending physician Dr. Posey Rea viewed and interpreted the cardiac monitored which showed an underlying rhythm of: sinus tachycardia with ventricular trigeminy. I agree with this interpretation.   Medications: I ordered medication including thiamine for history of alcohol abuse, Protonix for active GI bleed, fluid bolus for dehydration, lactic acidosis, and oral and IV dextrose for persistent hypoglycemia  3:21 PM Care of BENSYN BORNEMANN transferred to [facility] and Dr. Renaye Rakers at the end of my shift as the patient will require reassessment once labs/imaging have resulted. Patient presentation, ED course, and plan of care discussed with review of all pertinent labs and imaging. Please see his/her note for further details regarding further ED course and disposition. Plan at time of handoff is admission for recurrent hypoglycemia, alcohol use. This may be altered or completely changed at the discretion of the oncoming team pending results of further workup. Final Clinical Impression(s) / ED Diagnoses Final diagnoses:  None    Rx / DC Orders ED Discharge Orders     None         [place] 12/31/23 1522    Glendora Score, MD 12/31/23 1839

## 2023-12-31 NOTE — Assessment & Plan Note (Signed)
 Chronically documented. C.w. entresto, coreg.
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
## 2023-12-31 NOTE — Assessment & Plan Note (Signed)
 Pscyh engaged, see note.

## 2023-12-31 NOTE — ED Notes (Signed)
 Given food to eat. Patient eating will give more medication

## 2023-12-31 NOTE — BH Assessment (Signed)
 Patient was deferred to [facility] for a telepsych assessment at 2003. [facility] provider and consult time pending. Pt's care team and [facility] providers added to secure chat.

## 2023-12-31 NOTE — Assessment & Plan Note (Signed)
 Mild not felt to be dur to sepsis. Patient has h/o cirhosis. S.p nacl 1 liter. Monitoring. Clinically. Patient uses metofrmin at home - however, not used it in 5 days. Probably should be stopped.
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
## 2023-12-31 NOTE — ED Notes (Signed)
 Given sandwich and Soda.

## 2023-12-31 NOTE — Assessment & Plan Note (Signed)
 Chronic , suspected UC related. Painelss. Await GI input

## 2023-12-31 NOTE — ED Notes (Signed)
Report given to Shanice, RN

## 2024-01-01 DIAGNOSIS — K625 Hemorrhage of anus and rectum: Secondary | ICD-10-CM | POA: Diagnosis not present

## 2024-01-01 DIAGNOSIS — R45851 Suicidal ideations: Secondary | ICD-10-CM

## 2024-01-01 DIAGNOSIS — D649 Anemia, unspecified: Secondary | ICD-10-CM | POA: Diagnosis not present

## 2024-01-01 DIAGNOSIS — F1090 Alcohol use, unspecified, uncomplicated: Secondary | ICD-10-CM

## 2024-01-01 DIAGNOSIS — K219 Gastro-esophageal reflux disease without esophagitis: Secondary | ICD-10-CM

## 2024-01-01 DIAGNOSIS — R4589 Other symptoms and signs involving emotional state: Secondary | ICD-10-CM

## 2024-01-01 DIAGNOSIS — F1010 Alcohol abuse, uncomplicated: Secondary | ICD-10-CM

## 2024-01-01 DIAGNOSIS — R1013 Epigastric pain: Secondary | ICD-10-CM | POA: Diagnosis not present

## 2024-01-01 DIAGNOSIS — F4323 Adjustment disorder with mixed anxiety and depressed mood: Secondary | ICD-10-CM | POA: Diagnosis not present

## 2024-01-01 LAB — GLUCOSE, CAPILLARY
Glucose-Capillary: 113 mg/dL — ABNORMAL HIGH (ref 70–99)
Glucose-Capillary: 116 mg/dL — ABNORMAL HIGH (ref 70–99)
Glucose-Capillary: 127 mg/dL — ABNORMAL HIGH (ref 70–99)
Glucose-Capillary: 131 mg/dL — ABNORMAL HIGH (ref 70–99)
Glucose-Capillary: 139 mg/dL — ABNORMAL HIGH (ref 70–99)

## 2024-01-01 LAB — BASIC METABOLIC PANEL
Anion gap: 11 (ref 5–15)
BUN: 14 mg/dL (ref 6–20)
CO2: 22 mmol/L (ref 22–32)
Calcium: 8.6 mg/dL — ABNORMAL LOW (ref 8.9–10.3)
Chloride: 103 mmol/L (ref 98–111)
Creatinine, Ser: 0.87 mg/dL (ref 0.61–1.24)
GFR, Estimated: >60 mL/min (ref >60)
Glucose, Bld: 133 mg/dL — ABNORMAL HIGH (ref 70–99)
Potassium: 3.1 mmol/L — ABNORMAL LOW (ref 3.5–5.1)
Sodium: 136 mmol/L (ref 135–145)

## 2024-01-01 LAB — CBC
HCT: 35.8 % — ABNORMAL LOW (ref 39.0–52.0)
Hemoglobin: 11.7 g/dL — ABNORMAL LOW (ref 13.0–17.0)
MCH: 29.3 pg (ref 26.0–34.0)
MCHC: 32.7 g/dL (ref 30.0–36.0)
MCV: 89.7 fL (ref 80.0–100.0)
Platelets: 276 10*3/uL (ref 150–400)
RBC: 3.99 MIL/uL — ABNORMAL LOW (ref 4.22–5.81)
RDW: 14.2 % (ref 11.5–15.5)
WBC: 7.7 10*3/uL (ref 4.0–10.5)
nRBC: 0 % (ref 0.0–0.2)

## 2024-01-01 LAB — PROTIME-INR
INR: 1 (ref 0.8–1.2)
Prothrombin Time: 13.8 s (ref 11.4–15.2)

## 2024-01-01 LAB — HIV ANTIBODY (ROUTINE TESTING W REFLEX): HIV Screen 4th Generation wRfx: NONREACTIVE

## 2024-01-01 LAB — VITAMIN B12: Vitamin B-12: 356 pg/mL (ref 180–914)

## 2024-01-01 LAB — APTT: aPTT: 30 s (ref 24–36)

## 2024-01-01 LAB — TSH: TSH: 1.172 u[IU]/mL (ref 0.350–4.500)

## 2024-01-01 MED ORDER — HYDROMORPHONE HCL 1 MG/ML IJ SOLN
1.0000 mg | INTRAMUSCULAR | Status: DC | PRN
  Administered 2024-01-01 – 2024-01-02 (×4): 1 mg via INTRAVENOUS
  Filled 2024-01-01 (×4): qty 1

## 2024-01-01 MED ORDER — DULOXETINE HCL 30 MG PO CPEP
60.0000 mg | ORAL_CAPSULE | Freq: Every day | ORAL | Status: DC
  Administered 2024-01-01 – 2024-01-03 (×3): 60 mg via ORAL
  Filled 2024-01-01 (×3): qty 2

## 2024-01-01 MED ORDER — ORAL CARE MOUTH RINSE: 15.0000 mL | OROMUCOSAL | Status: DC | PRN

## 2024-01-01 MED ORDER — POTASSIUM CHLORIDE CRYS ER 20 MEQ PO TBCR
40.0000 meq | EXTENDED_RELEASE_TABLET | Freq: Once | ORAL | Status: AC
  Administered 2024-01-01: 40 meq via ORAL
  Filled 2024-01-01: qty 2

## 2024-01-01 MED ORDER — HYDROCORTISONE ACETATE 25 MG RE SUPP
25.0000 mg | Freq: Every day | RECTAL | Status: DC
  Administered 2024-01-01 – 2024-01-03 (×2): 25 mg via RECTAL
  Filled 2024-01-01 (×3): qty 1

## 2024-01-01 MED ORDER — DULOXETINE HCL 30 MG PO CPEP
30.0000 mg | ORAL_CAPSULE | Freq: Every day | ORAL | Status: DC
Start: 2024-01-02 — End: 2024-01-04
  Administered 2024-01-02 – 2024-01-04 (×3): 30 mg via ORAL
  Filled 2024-01-01 (×3): qty 1

## 2024-01-01 MED ORDER — OXYCODONE HCL 5 MG PO TABS
5.0000 mg | ORAL_TABLET | Freq: Four times a day (QID) | ORAL | Status: DC | PRN
  Administered 2024-01-01: 5 mg via ORAL
  Filled 2024-01-01: qty 1

## 2024-01-01 NOTE — Plan of Care (Signed)

## 2024-01-01 NOTE — Progress Notes (Signed)
 [facility]  PROGRESS NOTE  Jacob Adams:[id number] DOB: [birth year] DOA: 12/31/2023 PCP: [facility]   Brief HPI:   58 y.o. male with medical history significant of medical issues as listed below.  Patient has previously had history of alcohol abuse.  However has been in remission since 2023.  Patient relapsed about 5 days ago and describes drinking about a quart of vodka every day or even more.  This has been associated with epigastric area abdominal discomfort/pain without any radiation.  Had 1 episode of vomiting today.  No diarrhea.  No fever no trauma.  Patient started having suicidal ideation today, started feeling like running his car into a wall to get it over with.  This is corroborated by patient's wife at the bedside.  Patient brought to the ER accordingly.  Patient has been involuntarily committed by ER provider as well as has been seen by psychiatry nurse practitioner.     Assessment/Plan:   Suicidal ideation Pscyh consult obtained Continue IVC; one-to-one observation for suicidal ideation   Nausea, vomiting and epigastric pain -Improved -Abdominal ultrasound is negative -Abdomen x-ray showed nonobstructive bowel gas pattern -Started on pantoprazole   Hypoglycemia -Unclear etiology; resolved -Patient eating better -Will discontinue D5W and continue to monitor CBG    Lactic acid acidosis -Lactic acid was 2.8, likely from underlying cirrhosis -No concern for sepsis -Repeat lactic acid today    Low back pain Traumatic, no focal neurologic deficit.  -X-ray of lumbar spine showed mild L4-5 disc space narrowing   Chronic systolic CHF (congestive heart failure) (HCC) Chronically documented. C.w. entresto, coreg.   Alcohol abuse Continue thiamine -Start Ativan per CIWA protocol   History of GI bleed Chronic , suspected UC related. Painelss. Await GI input    Medications     atorvastatin  80 mg Oral QHS   carvedilol  6.25 mg Oral BID    hydrocortisone  25 mg Rectal QHS   multivitamin with minerals  1 tablet Oral Daily   pantoprazole (PROTONIX) IV  40 mg Intravenous Q24H   potassium chloride  40 mEq Oral Once   sacubitril-valsartan  1 tablet Oral BID   sodium chloride flush  3 mL Intravenous Q12H   spironolactone  25 mg Oral Daily   thiamine  100 mg Oral Daily     Data Reviewed:   CBG:  Recent Labs  Lab 12/31/23 1855 01/01/24 0007 01/01/24 0409 01/01/24 0747 01/01/24 1144  GLUCAP 85 113* 116* 131* 127*    SpO2: 100 %    Vitals:   12/31/23 1952 12/31/23 2347 01/01/24 0407 01/01/24 0801  BP: (!) 133/96 120/70 (!) 129/90 (!) 153/105  Pulse: 83 86 72 72  Resp: 18 14 16 16   Temp: 98.7 F (37.1 C) 98.4 F (36.9 C)  98.4 F (36.9 C)  TempSrc:    Oral  SpO2: 100% 100% 99% 100%  Weight:      Height:          Data Reviewed:  Basic Metabolic Panel: Recent Labs  Lab 12/31/23 1011 01/01/24 0602  NA 139 136  K 3.3* 3.1*  CL 103 103  CO2 18* 22  GLUCOSE 67* 133*  BUN 16 14  CREATININE 0.91 0.87  CALCIUM 8.8* 8.6*    CBC: Recent Labs  Lab 12/31/23 1011 01/01/24 0602  WBC 7.3 7.7  HGB 13.1 11.7*  HCT 41.5 35.8*  MCV 91.6 89.7  PLT 348 276    LFT Recent Labs  Lab 12/31/23 1011  AST 32  ALT 21  ALKPHOS 78  BILITOT 0.8  PROT 7.7  ALBUMIN 4.2     Antibiotics: Anti-infectives (From admission, onward)    None        DVT prophylaxis: SCDs  Code Status: Full code  Family Communication: No family at bedside   CONSULTS gastroenterology   Subjective   Complains of back pain   Objective    Physical Examination:   General-appears in no acute distress Heart-S1-S2, regular, no murmur auscultated Lungs-clear to auscultation bilaterally, no wheezing or crackles auscultated Abdomen-soft, nontender, no organomegaly Extremities-no edema in the lower extremities Neuro-alert, oriented x3, no focal deficit noted   Status is: Inpatient:             Meredeth Ide   [facility] If 7PM-7AM, please contact night-coverage at [email or web address], Office  [phone]   01/01/2024, 1:04 PM  LOS: 1 day

## 2024-01-01 NOTE — TOC Initial Note (Signed)
 Transition of Care [facility]) - Initial/Assessment Note    Patient Details  Name: Jacob Adams MRN: [id number] Date of Birth: [birth year]  [facility]) CM/SW Contact:    Otelia Santee, LCSW Phone Number: 01/01/2024, 2:37 PM  Clinical Narrative:                 TOC following for discharge planning needs. Currently awaiting psych evaluation to determine recommendation for disposition.   Expected Discharge Plan: Psychiatric Hospital Barriers to Discharge: Continued Medical Work up   Patient Goals and CMS Choice Patient states their goals for this hospitalization and ongoing recovery are:: Unable to assess          Expected Discharge Plan and Services In-house Referral: Clinical Social Work Discharge Planning Services: NA Post Acute Care Choice: NA Living arrangements for the past 2 months: Single Family Home                 DME Arranged: N/A DME Agency: NA                  Prior Living Arrangements/Services Living arrangements for the past 2 months: Single Family Home Lives with:: Spouse Patient language and need for interpreter reviewed:: Yes Do you feel safe going back to the place where you live?: Yes      Need for Family Participation in Patient Care: No (Comment) Care giver support system in place?: No (comment) Current home services:  (NA) Criminal Activity/Legal Involvement Pertinent to Current Situation/Hospitalization: No - Comment as needed  Activities of Daily Living   ADL Screening (condition at time of admission) Independently performs ADLs?: Yes (appropriate for developmental age) Is the patient deaf or have difficulty hearing?: No Does the patient have difficulty seeing, even when wearing glasses/contacts?: No Does the patient have difficulty concentrating, remembering, or making decisions?: No  Permission Sought/Granted                  Emotional Assessment   Attitude/Demeanor/Rapport: Unable to Assess Affect (typically  observed): Unable to Assess Orientation: : Oriented to Self, Oriented to Place, Oriented to  Time, Oriented to Situation Alcohol / Substance Use: Alcohol Use Psych Involvement: Yes (comment)  Admission diagnosis:  Suicidal ideation [R45.851] Suicidal behavior [R45.89] Hypoglycemia [E16.2] Gastrointestinal hemorrhage, unspecified gastrointestinal hemorrhage type [K92.2] Patient Active Problem List   Diagnosis Date Noted   Low back pain 12/31/2023   Lactic acid acidosis 12/31/2023   Low glucose level 12/31/2023   Suicidal behavior 12/31/2023   Chronic colitis 04/25/2022   Alcoholic cirrhosis of liver without ascites (HCC) 04/25/2022   Portal hypertensive gastropathy (HCC) 04/25/2022   History of anemia 04/25/2022   Hyponatremia 01/23/2022   High anion gap metabolic acidosis 01/23/2022   Hyperkalemia 01/23/2022   Fatty liver 01/23/2022   Nausea, vomiting and epigastric pain 01/23/2022   Insomnia 10/04/2020   AMS (altered mental status) 07/23/2020   Sleep apnea    Hypertension    Acute metabolic encephalopathy 01/24/2020   Normocytic anemia 08/15/2019   Hypothyroidism 08/15/2019   HLD (hyperlipidemia) 08/15/2019   Anxiety and depression 08/15/2019   Chronic systolic CHF (congestive heart failure) (HCC) 02/08/2019   Tobacco dependence 02/08/2019   Alcohol withdrawal (HCC) 01/2019   HTN (hypertension) 12/24/2018   Alcohol abuse 01/06/2016   MDD (major depressive disorder), recurrent, severe, 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 [place] [address] Phone: [phone] Fax: [phone]  [facility] - [place], [place] - [address] [place] [address] Phone: [phone] Fax: [phone]     Social Drivers of Health (SDOH) Social History: SDOH Screenings   Food Insecurity: No Food Insecurity (12/31/2023)  Housing: Low Risk  (12/31/2023)  Transportation Needs: Unmet Transportation Needs (12/31/2023)  Utilities: Not At Risk (12/31/2023)  Alcohol Screen: Medium Risk (06/29/2020)  Depression (PHQ2-9): Medium Risk (06/29/2020)  Financial Resource Strain: Not on File (10/03/2022)   Received from [place], [place]  Physical Activity: Not on File (10/03/2022)   Received from [place], [place]  Social Connections: Not on File (06/30/2023)   Received from [facility]  Stress: Not on File (10/03/2022)   Received from [place], [place]  Tobacco Use: High Risk (12/31/2023)   SDOH Interventions: Food Insecurity Interventions: Intervention Not Indicated Housing Interventions: Other (Comment) Transportation Interventions: Inpatient TOC, Intervention Not Indicated (Pt has car) Utilities Interventions: Intervention Not Indicated   Readmission Risk Interventions    01/01/2024    2:34 PM  Readmission Risk Prevention Plan  Post Dischage Appt Complete  Medication Screening Complete  Transportation Screening Complete
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
## 2024-01-02 ENCOUNTER — Encounter (HOSPITAL_COMMUNITY): Payer: Self-pay | Admitting: Internal Medicine

## 2024-01-02 DIAGNOSIS — R45851 Suicidal ideations: Secondary | ICD-10-CM | POA: Diagnosis not present

## 2024-01-02 DIAGNOSIS — R1013 Epigastric pain: Secondary | ICD-10-CM | POA: Diagnosis not present

## 2024-01-02 DIAGNOSIS — K922 Gastrointestinal hemorrhage, unspecified: Secondary | ICD-10-CM

## 2024-01-02 DIAGNOSIS — F1010 Alcohol abuse, uncomplicated: Secondary | ICD-10-CM | POA: Diagnosis not present

## 2024-01-02 DIAGNOSIS — R4589 Other symptoms and signs involving emotional state: Secondary | ICD-10-CM

## 2024-01-02 DIAGNOSIS — F332 Major depressive disorder, recurrent severe without psychotic features: Principal | ICD-10-CM

## 2024-01-02 DIAGNOSIS — E162 Hypoglycemia, unspecified: Secondary | ICD-10-CM

## 2024-01-02 LAB — LACTIC ACID, PLASMA: Lactic Acid, Venous: 1.6 mmol/L (ref 0.5–1.9)

## 2024-01-02 LAB — GLUCOSE, CAPILLARY
Glucose-Capillary: 120 mg/dL — ABNORMAL HIGH (ref 70–99)
Glucose-Capillary: 133 mg/dL — ABNORMAL HIGH (ref 70–99)
Glucose-Capillary: 137 mg/dL — ABNORMAL HIGH (ref 70–99)
Glucose-Capillary: 140 mg/dL — ABNORMAL HIGH (ref 70–99)
Glucose-Capillary: 147 mg/dL — ABNORMAL HIGH (ref 70–99)
Glucose-Capillary: 152 mg/dL — ABNORMAL HIGH (ref 70–99)

## 2024-01-02 LAB — BASIC METABOLIC PANEL
Anion gap: 9 (ref 5–15)
BUN: 7 mg/dL (ref 6–20)
CO2: 21 mmol/L — ABNORMAL LOW (ref 22–32)
Calcium: 8.9 mg/dL (ref 8.9–10.3)
Chloride: 103 mmol/L (ref 98–111)
Creatinine, Ser: 0.71 mg/dL (ref 0.61–1.24)
GFR, Estimated: >60 mL/min (ref >60)
Glucose, Bld: 147 mg/dL — ABNORMAL HIGH (ref 70–99)
Potassium: 3.4 mmol/L — ABNORMAL LOW (ref 3.5–5.1)
Sodium: 133 mmol/L — ABNORMAL LOW (ref 135–145)

## 2024-01-02 LAB — SARS CORONAVIRUS 2 BY RT PCR: SARS Coronavirus 2 by RT PCR: NEGATIVE

## 2024-01-02 MED ORDER — OLANZAPINE 10 MG PO TABS: 10.0000 mg | ORAL_TABLET | Freq: Two times a day (BID) | ORAL | Status: DC | PRN

## 2024-01-02 MED ORDER — OLANZAPINE 10 MG IM SOLR
10.0000 mg | Freq: Two times a day (BID) | INTRAMUSCULAR | Status: DC | PRN
Start: 2024-01-02 — End: 2024-01-04

## 2024-01-02 MED ORDER — QUETIAPINE FUMARATE 100 MG PO TABS
100.0000 mg | ORAL_TABLET | Freq: Every day | ORAL | Status: DC
  Administered 2024-01-02 – 2024-01-03 (×2): 100 mg via ORAL
  Filled 2024-01-02 (×2): qty 1

## 2024-01-02 MED ORDER — HYDROCORTISONE ACETATE 25 MG RE SUPP: 25.0000 mg | Freq: Every day | RECTAL | Status: DC

## 2024-01-02 MED ORDER — OXYCODONE HCL 5 MG PO TABS
10.0000 mg | ORAL_TABLET | Freq: Four times a day (QID) | ORAL | Status: DC | PRN
  Administered 2024-01-02 – 2024-01-04 (×5): 10 mg via ORAL
  Filled 2024-01-02 (×5): qty 2

## 2024-01-02 MED ORDER — POTASSIUM CHLORIDE CRYS ER 20 MEQ PO TBCR
20.0000 meq | EXTENDED_RELEASE_TABLET | Freq: Once | ORAL | Status: AC
  Administered 2024-01-02: 20 meq via ORAL
  Filled 2024-01-02: qty 1

## 2024-01-02 MED ORDER — PANTOPRAZOLE SODIUM 40 MG PO TBEC: 40.0000 mg | DELAYED_RELEASE_TABLET | Freq: Every day | ORAL | 1 refills | Status: DC

## 2024-01-02 NOTE — Discharge Summary (Signed)
 Physician Discharge Summary   Patient: Jacob Adams MRN: [id number] DOB: 30-Jun-1966  Admit date:     12/31/2023  Discharge date: 01/04/24  Discharge Physician: Meredeth Ide   [facility]P: [facility]   Recommendations at discharge:   Patient will be discharged to [facility] for treatment of depression  Discharge Diagnoses: Principal Problem:   Suicidal behavior Active Problems:   Suicidal ideation   Alcohol use disorder, severe, dependence (HCC)   History of GI bleed   Alcohol abuse   Chronic systolic CHF (congestive heart failure) (HCC)   Low back pain   Lactic acid acidosis   Low glucose level   Major depressive disorder, recurrent, severe without psychotic behavior (HCC)  Resolved Problems:   Nausea, vomiting and epigastric pain   Adjustment disorder with mixed anxiety and depressed mood  Hospital Course: 58 y.o. male with medical history significant of medical issues as listed below.  Patient has previously had history of alcohol abuse.  However has been in remission since 2023.  Patient relapsed about 5 days ago and describes drinking about a quart of vodka every day or even more.  This has been associated with epigastric area abdominal discomfort/pain without any radiation.  Had 1 episode of vomiting today.  No diarrhea.  No fever no trauma.  Patient started having suicidal ideation today, started feeling like running his car into a wall to get it over with.  This is corroborated by patient's wife at the bedside.  Patient brought to the ER accordingly.  Patient has been involuntarily committed by ER provider as well as has been seen by psychiatry nurse practitioner.   Assessment and Plan:  Suicidal ideation Pscyh consult obtained Continue IVC; one-to-one observation for suicidal ideation -Patient will be discharged to behavioral health center for inpatient treatment   Nausea, vomiting and epigastric pain -Improved -Abdominal ultrasound is  negative -Abdomen x-ray showed nonobstructive bowel gas pattern -Started on pantoprazole   Hypoglycemia -Unclear etiology; resolved -Patient eating better -discontinued D5W    Hypokalemia -Potassium is 3.4 -will give one dose of Kdur 20 meq      Lactic acid acidosis -Lactic acid was 2.8, likely from underlying cirrhosis -No concern for sepsis -Repeat lactic acid today is 1.6     Low back pain  no focal neurologic deficit.  -X-ray of lumbar spine showed mild L4-5 disc space narrowing    Chronic systolic CHF (congestive heart failure) (HCC) Chronically documented. C.w. entresto, coreg. -Discontinue Cozaar, patient not taking   Alcohol abuse Continue thiamine No signs and symptoms of alcohol withdrawal   History of GI bleed Chronic , gastroenterology consult obtained -Last colonoscopy had showed chronic inactive ulcerative colitis.   -found to have edematous internal hemorrhoids -[facility] as outpt for consideration of surveillance colonoscopy/EGD.  No endoscopic procedures planned this adm.   -Started on Anusol suppository daily for 3 days         Consultants: Gastroenterology Procedures performed:  Disposition: [facility] Diet recommendation:  Discharge Diet Orders (From admission, onward)     Start     Ordered   01/02/24 0000  Diet - low sodium heart healthy        01/02/24 1440           Regular diet DISCHARGE MEDICATION: Allergies as of 01/04/2024       Reactions   Uncoded Nonscreenable Allergen Swelling, Other (See Comments)   Sun tan lotion: swelling and peeling   Nsaids Other (See Comments),  Swelling, Rash   "Discomfort of stomach," also Pt reports he cannot take    "Discomfort of stomach," also   Ibuprofen Nausea Only, Swelling, Other (See Comments)   "Discomfort of stomach," also   Pork (diagnostic) Other (See Comments)   Penicillins Itching, Swelling, Rash   Has patient had a PCN reaction causing immediate rash,  facial/tongue/throat swelling, SOB or lightheadedness with hypotension: yes Has patient had a PCN reaction causing severe rash involving mucus membranes or skin necrosis: no Has patient had a PCN reaction that required hospitalization yes, happened while hospitalized Has patient had a PCN reaction occurring within the last 10 years: yes If all of the above answers are "NO", then may proceed with Cephalosporin use.   [name] Other (See Comments)   DOESN'T EAT PORK- patient preference        Medication List     STOP taking these medications    losartan 50 MG tablet Commonly known as: COZAAR   naproxen 375 MG tablet Commonly known as: NAPROSYN       TAKE these medications    alprostadil 40 MCG injection Commonly known as: EDEX 40 mcg by Intracavitary route as needed for erectile dysfunction. use no more than 3 times per week   atorvastatin 80 MG tablet Commonly known as: LIPITOR Take 80 mg by mouth at bedtime.   carvedilol 6.25 MG tablet Commonly known as: COREG Take 6.25 mg by mouth 2 (two) times daily.   cyclobenzaprine 10 MG tablet Commonly known as: FLEXERIL Take 10 mg by mouth daily as needed for muscle spasms.   DULoxetine 30 MG capsule Commonly known as: CYMBALTA Take 30 mg by mouth 2 (two) times daily.   Emgality 120 MG/ML Soaj Generic drug: Galcanezumab-gnlm Inject 1 Pen into the skin every 30 (thirty) days.   Entresto 49-51 MG Generic drug: sacubitril-valsartan Take 1 tablet by mouth 2 (two) times daily.   Farxiga 10 MG Tabs tablet Generic drug: dapagliflozin propanediol Take 10 mg by mouth every morning.   folic acid 1 MG tablet Commonly known as: FOLVITE Take 1 mg by mouth daily.   hydrocortisone 25 MG suppository Commonly known as: ANUSOL-HC Place 1 suppository (25 mg total) rectally at bedtime for 3 days.   lidocaine 5 % ointment Commonly known as: XYLOCAINE Apply 1 application  topically 2 (two) times daily as needed (knee  and joint pain).   metFORMIN 500 MG tablet Commonly known as: GLUCOPHAGE Take 500 mg by mouth every morning.   multivitamin with minerals Tabs tablet Take 1 tablet by mouth daily.   naltrexone 50 MG tablet Commonly known as: DEPADE Take 50 mg by mouth in the morning.   OLANZapine 10 MG tablet Commonly known as: ZYPREXA Take 1 tablet (10 mg total) by mouth 2 (two) times daily as needed (agitation).   pantoprazole 40 MG tablet Commonly known as: Protonix Take 1 tablet (40 mg total) by mouth daily.   QUEtiapine 400 MG 24 hr tablet Commonly known as: SEROQUEL XR Take 3 tablets by mouth at bedtime.   spironolactone 25 MG tablet Commonly known as: ALDACTONE Take 25 mg by mouth daily.   thiamine 100 MG tablet Commonly known as: VITAMIN B1 Take 1 tablet (100 mg total) by mouth daily.   Ubrelvy 50 MG Tabs Generic drug: Ubrogepant Take 50 mg by mouth as needed. What changed: reasons to take this        Discharge Exam: Filed Weights   12/31/23 1000  Weight: 114 kg  General-appears in no acute distress Heart-S1-S2, regular, no murmur auscultated Lungs-clear to auscultation bilaterally, no wheezing or crackles auscultated Abdomen-soft, nontender, no organomegaly Extremities-no edema in the lower extremities Neuro-alert, oriented x3, no focal deficit noted  Condition at discharge: good  The results of significant diagnostics from this hospitalization (including imaging, microbiology, ancillary and laboratory) are listed below for reference.   Imaging Studies: US Abdomen Limited RUQ (LIVER/GB) Result Date: 12/31/2023 CLINICAL DATA:  [id number] Pain [id number] EXAM: ULTRASOUND ABDOMEN LIMITED RIGHT UPPER QUADRANT COMPARISON:  CT 10/14/2022 and previous FINDINGS: Gallbladder: No gallstones or wall thickening visualized. No sonographic Murphy sign noted by sonographer. Common bile duct: Diameter: 2 mm.  No intrahepatic biliary ductal dilatation noted. Liver: No focal lesion  identified. Within normal limits in parenchymal echogenicity. Portal vein is patent on color Doppler imaging with normal direction of blood flow towards the liver. Other: None. IMPRESSION: Negative. Electronically Signed   By: Corlis Leak M.D.   On: 12/31/2023 21:21   DG Abd 2 Views Result Date: 12/31/2023 CLINICAL DATA:  Back pain, abdominal pain EXAM: ABDOMEN - 2 VIEW COMPARISON:  CT 10/14/2022 FINDINGS: Lung bases clear. No free air. Mild gaseous distention of central small bowel loops and colon, without overt dilatation. No pneumothorax. No abnormal abdominal calcifications. IMPRESSION: Nonobstructive bowel gas pattern. Electronically Signed   By: Corlis Leak M.D.   On: 12/31/2023 21:20   DG Lumbar Spine 2-3 Views Result Date: 12/31/2023 CLINICAL DATA:  Back and right lower extremity pain EXAM: LUMBAR SPINE - 2-3 VIEW COMPARISON:  08/19/2022 FINDINGS: No fracture. Normal alignment. Mild narrowing of the L4-5 interspace left greater than right. Regional soft tissues unremarkable. IMPRESSION: 1. No acute findings. 2. Mild L4-5 disc space narrowing. Electronically Signed   By: Corlis Leak M.D.   On: 12/31/2023 21:19   CT Head Wo Contrast Result Date: 12/31/2023 CLINICAL DATA:  Provided history: Mental status change, unknown cause. EXAM: CT HEAD WITHOUT CONTRAST TECHNIQUE: Contiguous axial images were obtained from the base of the skull through the vertex without intravenous contrast. RADIATION DOSE REDUCTION: This exam was performed according to the departmental dose-optimization program which includes automated exposure control, adjustment of the mA and/or kV according to patient size and/or use of iterative reconstruction technique. COMPARISON:  Head CT 09/28/2022. FINDINGS: Streak/beam hardening artifact partially obscures the posterior fossa inferiorly. Within this limitation, findings are as follows. Brain: Mild generalized cerebral atrophy. There is no acute intracranial hemorrhage. No demarcated  cortical infarct. No extra-axial fluid collection. No evidence of an intracranial mass. No midline shift. Vascular: No hyperdense vessel. Skull: No calvarial fracture or aggressive osseous lesion. Sinuses/Orbits: No mass or acute finding within the imaged orbits. No significant paranasal sinus disease at the imaged levels. IMPRESSION: Streak/beam hardening artifact partially obscures the posterior fossa inferiorly. Within this limitation, no evidence of an acute intracranial abnormality. Electronically Signed   By: Jackey Loge D.O.   On: 12/31/2023 16:35   DG Chest Portable 1 View Result Date: 12/31/2023 CLINICAL DATA:  Shortness of breath.  Chest pain. EXAM: PORTABLE CHEST 1 VIEW COMPARISON:  01/08/2023. FINDINGS: Low lung volume. Bilateral lung fields are clear. Bilateral costophrenic angles are clear. Normal cardio-mediastinal silhouette. No acute osseous abnormalities. The soft tissues are within normal limits. IMPRESSION: No active disease. Electronically Signed   By: Jules Schick M.D.   On: 12/31/2023 13:56    Microbiology: Results for orders placed or performed during the hospital encounter of 12/31/23  Resp panel by RT-PCR (RSV, Flu A&B, Covid) Anterior  Nasal Swab     Status: None   Collection Time: 12/31/23 10:18 AM   Specimen: Anterior Nasal Swab  Result Value Ref Range Status   SARS Coronavirus 2 by RT PCR NEGATIVE NEGATIVE Final    Comment: (NOTE) SARS-CoV-2 target nucleic acids are NOT DETECTED.  The SARS-CoV-2 RNA is generally detectable in upper respiratory specimens during the acute phase of infection. The lowest concentration of SARS-CoV-2 viral copies this assay can detect is 138 copies/mL. A negative result does not preclude SARS-Cov-2 infection and should not be used as the sole basis for treatment or other patient management decisions. A negative result may occur with  improper specimen collection/handling, submission of specimen other than nasopharyngeal swab, presence  of viral mutation(s) within the areas targeted by this assay, and inadequate number of viral copies(<138 copies/mL). A negative result must be combined with clinical observations, patient history, and epidemiological information. The expected result is Negative.  Fact Sheet for Patients:  BloggerCourse.com  Fact Sheet for Healthcare Providers:  SeriousBroker.it  This test is no t yet approved or cleared by the [place] FDA and  has been authorized for detection and/or diagnosis of SARS-CoV-2 by FDA under an Emergency Use Authorization (EUA). This EUA will remain  in effect (meaning this test can be used) for the duration of the COVID-19 declaration under Section 564(b)(1) of the Act, 21 U.S.C.section 360bbb-3(b)(1), unless the authorization is terminated  or revoked sooner.       Influenza A by PCR NEGATIVE NEGATIVE Final   Influenza B by PCR NEGATIVE NEGATIVE Final    Comment: (NOTE) The Xpert Xpress SARS-CoV-2/FLU/RSV plus assay is intended as an aid in the diagnosis of influenza from Nasopharyngeal swab specimens and should not be used as a sole basis for treatment. Nasal washings and aspirates are unacceptable for Xpert Xpress SARS-CoV-2/FLU/RSV testing.  Fact Sheet for Patients: BloggerCourse.com  Fact Sheet for Healthcare Providers: SeriousBroker.it  This test is not yet approved or cleared by the [place] FDA and has been authorized for detection and/or diagnosis of SARS-CoV-2 by FDA under an Emergency Use Authorization (EUA). This EUA will remain in effect (meaning this test can be used) for the duration of the COVID-19 declaration under Section 564(b)(1) of the Act, 21 U.S.C. section 360bbb-3(b)(1), unless the authorization is terminated or revoked.     Resp Syncytial Virus by PCR NEGATIVE NEGATIVE Final    Comment: (NOTE) Fact Sheet for  Patients: BloggerCourse.com  Fact Sheet for Healthcare Providers: SeriousBroker.it  This test is not yet approved or cleared by the [place] FDA and has been authorized for detection and/or diagnosis of SARS-CoV-2 by FDA under an Emergency Use Authorization (EUA). This EUA will remain in effect (meaning this test can be used) for the duration of the COVID-19 declaration under Section 564(b)(1) of the Act, 21 U.S.C. section 360bbb-3(b)(1), unless the authorization is terminated or revoked.  Performed at [facility], [address], [place], [place] [address]   SARS Coronavirus 2 by RT PCR (hospital order, performed in [facility] hospital lab) *cepheid single result test* Anterior Nasal Swab     Status: None   Collection Time: 01/02/24  2:29 PM   Specimen: Anterior Nasal Swab  Result Value Ref Range Status   SARS Coronavirus 2 by RT PCR NEGATIVE NEGATIVE Final    Comment: (NOTE) SARS-CoV-2 target nucleic acids are NOT DETECTED.  The SARS-CoV-2 RNA is generally detectable in upper and lower respiratory specimens during the acute phase of infection.  The lowest concentration of SARS-CoV-2 viral copies this assay can detect is 250 copies / mL. A negative result does not preclude SARS-CoV-2 infection and should not be used as the sole basis for treatment or other patient management decisions.  A negative result may occur with improper specimen collection / handling, submission of specimen other than nasopharyngeal swab, presence of viral mutation(s) within the areas targeted by this assay, and inadequate number of viral copies (<250 copies / mL). A negative result must be combined with clinical observations, patient history, and epidemiological information.  Fact Sheet for Patients:   RoadLapTop.co.za  Fact Sheet for Healthcare  Providers: http://kim-miller.com/  This test is not yet approved or  cleared by the [place] FDA and has been authorized for detection and/or diagnosis of SARS-CoV-2 by FDA under an Emergency Use Authorization (EUA).  This EUA will remain in effect (meaning this test can be used) for the duration of the COVID-19 declaration under Section 564(b)(1) of the Act, 21 U.S.C. section 360bbb-3(b)(1), unless the authorization is terminated or revoked sooner.  Performed at [facility], [address] [place], [place] [address]     Labs: CBC: Recent Labs  Lab 12/31/23 1011 01/01/24 0602  WBC 7.3 7.7  HGB 13.1 11.7*  HCT 41.5 35.8*  MCV 91.6 89.7  PLT 348 276   Basic Metabolic Panel: Recent Labs  Lab 12/31/23 1011 01/01/24 0602 01/02/24 1445  NA 139 136 133*  K 3.3* 3.1* 3.4*  CL 103 103 103  CO2 18* 22 21*  GLUCOSE 67* 133* 147*  BUN 16 14 7   CREATININE 0.91 0.87 0.71  CALCIUM 8.8* 8.6* 8.9   Liver Function Tests: Recent Labs  Lab 12/31/23 1011  AST 32  ALT 21  ALKPHOS 78  BILITOT 0.8  PROT 7.7  ALBUMIN 4.2   CBG: Recent Labs  Lab 01/03/24 1616 01/03/24 1925 01/04/24 0003 01/04/24 0336 01/04/24 0728  GLUCAP 149* 129* 135* 132* 121*    Discharge time spent: greater than 30 minutes.  Signed: Meredeth Ide, MD [facility] 01/04/2024
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
## 2024-01-02 NOTE — Plan of Care (Signed)

## 2024-01-02 NOTE — Progress Notes (Signed)
 [facility]  PROGRESS NOTE  Jacob Adams RUE:[id number] DOB: Jun 19, 1966 DOA: 12/31/2023 [facility]CP: [facility]   Brief HPI:   58 y.o. male with medical history significant of medical issues as listed below.  Patient has previously had history of alcohol abuse.  However has been in remission since 2023.  Patient relapsed about 5 days ago and describes drinking about a quart of vodka every day or even more.  This has been associated with epigastric area abdominal discomfort/pain without any radiation.  Had 1 episode of vomiting today.  No diarrhea.  No fever no trauma.  Patient started having suicidal ideation today, started feeling like running his car into a wall to get it over with.  This is corroborated by patient's wife at the bedside.  Patient brought to the ER accordingly.  Patient has been involuntarily committed by ER provider as well as has been seen by psychiatry nurse practitioner.     Assessment/Plan:   Suicidal ideation Pscyh consult obtained Continue IVC; one-to-one observation for suicidal ideation   Nausea, vomiting and epigastric pain -Improved -Abdominal ultrasound is negative -Abdomen x-ray showed nonobstructive bowel gas pattern -Started on pantoprazole   Hypoglycemia -Unclear etiology; resolved -Patient eating better -discontinued D5W and continue to monitor CBG  Hypokalemia -Potassium is 3.1 -Replaced, check BMP today    Lactic acid acidosis -Lactic acid was 2.8, likely from underlying cirrhosis -No concern for sepsis -Repeat lactic acid today    Low back pain  no focal neurologic deficit.  -X-ray of lumbar spine showed mild L4-5 disc space narrowing -Continue Dilaudid 1 mg IV every 4 hours as needed   Chronic systolic CHF (congestive heart failure) (HCC) Chronically documented. C.w. entresto, coreg.   Alcohol abuse Continue thiamine -Start Ativan per CIWA protocol   History of GI bleed Chronic , gastroenterology consult  obtained -Last colonoscopy had showed chronic inactive ulcerative colitis.   -found to have edematous internal hemorrhoids -[facility] GI as outpt for consideration of surveillance colonoscopy/EGD.  No endoscopic procedures planned this adm.     Medications     atorvastatin  80 mg Oral QHS   carvedilol  6.25 mg Oral BID   DULoxetine  30 mg Oral Daily   And   DULoxetine  60 mg Oral QHS   hydrocortisone  25 mg Rectal QHS   multivitamin with minerals  1 tablet Oral Daily   pantoprazole (PROTONIX) IV  40 mg Intravenous Q24H   sacubitril-valsartan  1 tablet Oral BID   sodium chloride flush  3 mL Intravenous Q12H   spironolactone  25 mg Oral Daily   thiamine  100 mg Oral Daily     Data Reviewed:   CBG:  Recent Labs  Lab 01/01/24 1958 01/02/24 0000 01/02/24 0406 01/02/24 0706 01/02/24 1147  GLUCAP 139* 133* 137* 140* 147*    SpO2: 99 %    Vitals:   01/01/24 0801 01/01/24 1311 01/01/24 2003 01/02/24 0624  BP: (!) 153/105 (!) 140/95 (!) 141/94 (!) 155/75  Pulse: 72 68 64 65  Resp: 16 17 18 18   Temp: 98.4 F (36.9 C) 97.8 F (36.6 C) 99.4 F (37.4 C) 98.8 F (37.1 C)  TempSrc: Oral Oral Oral Oral  SpO2: 100% 100% 100% 99%  Weight:      Height:          Data Reviewed:  Basic Metabolic Panel: Recent Labs  Lab 12/31/23 1011 01/01/24 0602  NA 139 136  K 3.3* 3.1*  CL 103 103  CO2 18* 22  GLUCOSE 67* 133*  BUN 16 14  CREATININE 0.91 0.87  CALCIUM 8.8* 8.6*    CBC: Recent Labs  Lab 12/31/23 1011 01/01/24 0602  WBC 7.3 7.7  HGB 13.1 11.7*  HCT 41.5 35.8*  MCV 91.6 89.7  PLT 348 276    LFT Recent Labs  Lab 12/31/23 1011  AST 32  ALT 21  ALKPHOS 78  BILITOT 0.8  PROT 7.7  ALBUMIN 4.2     Antibiotics: Anti-infectives (From admission, onward)    None        DVT prophylaxis: SCDs  Code Status: Full code  Family Communication: No family at bedside   CONSULTS gastroenterology   Subjective   Complains of low back pain.  No other  complaints at this time.   Objective    Physical Examination:   General-appears in no acute distress Heart-S1-S2, regular, no murmur auscultated Lungs-clear to auscultation bilaterally, no wheezing or crackles auscultated Abdomen-soft, nontender, no organomegaly Extremities-no edema in the lower extremities Neuro-alert, oriented x3, no focal deficit noted   Status is: Inpatient:             Meredeth Ide   [facility] If 7PM-7AM, please contact night-coverage at [email or web address], Office  [phone]   01/02/2024, 12:10 PM  LOS: 2 days

## 2024-01-02 NOTE — Plan of Care (Signed)
  Problem: Education: Goal: Knowledge of General Education information will improve Description: Including pain rating scale, medication(s)/side effects and non-pharmacologic comfort measures Outcome: Progressing   Problem: Health Behavior/Discharge Planning: Goal: Ability to manage health-related needs will improve Outcome: Progressing   Problem: Clinical Measurements: Goal: Ability to maintain clinical measurements within normal limits will improve Outcome: Progressing Goal: Will remain free from infection Outcome: Progressing   Problem: Activity: Goal: Risk for activity intolerance will decrease Outcome: Progressing   Problem: Nutrition: Goal: Adequate nutrition will be maintained Outcome: Progressing   Problem: Coping: Goal: Level of anxiety will decrease Outcome: Progressing   Problem: Elimination: Goal: Will not experience complications related to bowel motility Outcome: Progressing   Problem: Pain Managment: Goal: General experience of comfort will improve and/or be controlled Outcome: Progressing   Problem: Safety: Goal: Ability to remain free from injury will improve Outcome: Progressing

## 2024-01-03 LAB — GLUCOSE, CAPILLARY
Glucose-Capillary: 150 mg/dL — ABNORMAL HIGH (ref 70–99)
Glucose-Capillary: 116 mg/dL — ABNORMAL HIGH (ref 70–99)
Glucose-Capillary: 131 mg/dL — ABNORMAL HIGH (ref 70–99)
Glucose-Capillary: 149 mg/dL — ABNORMAL HIGH (ref 70–99)
Glucose-Capillary: 129 mg/dL — ABNORMAL HIGH (ref 70–99)

## 2024-01-03 NOTE — TOC Transition Note (Signed)
 Transition of Care [facility]) - Discharge Note   Patient Details  Name: Jacob Adams MRN: [id number] Date of Birth: 1965-10-26  Transition of Care [facility]) CM/SW Contact:  Larrie Kass, LCSW Phone Number: 01/03/2024, 10:16 AM   Clinical Narrative:    Pt to d/c to [facility] inpatient Psych unit.  TOC sign off.    Final next level of care: Psychiatric Hospital Barriers to Discharge: Barriers Resolved   Patient Goals and CMS Choice Patient states their goals for this hospitalization and ongoing recovery are:: Unable to assess          Discharge Placement                       Discharge Plan and Services Additional resources added to the After Visit Summary for   In-house Referral: Clinical Social Work Discharge Planning Services: NA Post Acute Care Choice: NA          DME Arranged: N/A DME Agency: NA                  Social Drivers of Health (SDOH) Interventions SDOH Screenings   Food Insecurity: No Food Insecurity (12/31/2023)  Housing: Low Risk  (12/31/2023)  Transportation Needs: Unmet Transportation Needs (12/31/2023)  Utilities: Not At Risk (12/31/2023)  Alcohol Screen: Medium Risk (06/29/2020)  Depression (PHQ2-9): Medium Risk (06/29/2020)  Financial Resource Strain: Not on File (10/03/2022)   Received from [place], [place]  Physical Activity: Not on File (10/03/2022)   Received from [place], [place]  Social Connections: Not on File (06/30/2023)   Received from [facility]  Stress: Not on File (10/03/2022)   Received from [place], [place]  Tobacco Use: High Risk (12/31/2023)     Readmission Risk Interventions    01/01/2024    2:34 PM  Readmission Risk Prevention Plan  Post Dischage Appt Complete  Medication Screening Complete  Transportation Screening Complete

## 2024-01-03 NOTE — Plan of Care (Signed)

## 2024-01-03 NOTE — Plan of Care (Signed)
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
## 2024-01-04 ENCOUNTER — Other Ambulatory Visit: Payer: Self-pay

## 2024-01-04 ENCOUNTER — Encounter: Payer: Self-pay | Admitting: Psychiatry

## 2024-01-04 ENCOUNTER — Inpatient Hospital Stay
Admission: AD | Admit: 2024-01-04 | Discharge: 2024-01-07 | DRG: 885 | Disposition: A | Discharge status: Home / Self Care | Source: Intra-hospital | Attending: Psychiatry | Admitting: Psychiatry

## 2024-01-04 DIAGNOSIS — Z8249 Family history of ischemic heart disease and other diseases of the circulatory system: Secondary | ICD-10-CM | POA: Diagnosis not present

## 2024-01-04 DIAGNOSIS — I110 Hypertensive heart disease with heart failure: Secondary | ICD-10-CM | POA: Diagnosis present

## 2024-01-04 DIAGNOSIS — Z560 Unemployment, unspecified: Secondary | ICD-10-CM | POA: Diagnosis not present

## 2024-01-04 DIAGNOSIS — E039 Hypothyroidism, unspecified: Secondary | ICD-10-CM | POA: Diagnosis present

## 2024-01-04 DIAGNOSIS — K219 Gastro-esophageal reflux disease without esophagitis: Secondary | ICD-10-CM | POA: Diagnosis present

## 2024-01-04 DIAGNOSIS — F332 Major depressive disorder, recurrent severe without psychotic features: Principal | ICD-10-CM | POA: Diagnosis present

## 2024-01-04 DIAGNOSIS — F17210 Nicotine dependence, cigarettes, uncomplicated: Secondary | ICD-10-CM | POA: Diagnosis present

## 2024-01-04 DIAGNOSIS — Z5982 Transportation insecurity: Secondary | ICD-10-CM

## 2024-01-04 DIAGNOSIS — R45851 Suicidal ideations: Secondary | ICD-10-CM | POA: Diagnosis present

## 2024-01-04 DIAGNOSIS — F17200 Nicotine dependence, unspecified, uncomplicated: Secondary | ICD-10-CM | POA: Diagnosis present

## 2024-01-04 DIAGNOSIS — I5022 Chronic systolic (congestive) heart failure: Secondary | ICD-10-CM | POA: Diagnosis present

## 2024-01-04 DIAGNOSIS — Z7984 Long term (current) use of oral hypoglycemic drugs: Secondary | ICD-10-CM | POA: Diagnosis not present

## 2024-01-04 DIAGNOSIS — Z880 Allergy status to penicillin: Secondary | ICD-10-CM

## 2024-01-04 DIAGNOSIS — Z635 Disruption of family by separation and divorce: Secondary | ICD-10-CM

## 2024-01-04 DIAGNOSIS — Z79899 Other long term (current) drug therapy: Secondary | ICD-10-CM

## 2024-01-04 DIAGNOSIS — F1020 Alcohol dependence, uncomplicated: Secondary | ICD-10-CM | POA: Diagnosis present

## 2024-01-04 DIAGNOSIS — K766 Portal hypertension: Secondary | ICD-10-CM | POA: Diagnosis present

## 2024-01-04 DIAGNOSIS — F4310 Post-traumatic stress disorder, unspecified: Secondary | ICD-10-CM | POA: Diagnosis present

## 2024-01-04 LAB — GLUCOSE, CAPILLARY
Glucose-Capillary: 111 mg/dL — ABNORMAL HIGH (ref 70–99)
Glucose-Capillary: 121 mg/dL — ABNORMAL HIGH (ref 70–99)
Glucose-Capillary: 132 mg/dL — ABNORMAL HIGH (ref 70–99)
Glucose-Capillary: 135 mg/dL — ABNORMAL HIGH (ref 70–99)

## 2024-01-04 MED ORDER — ADULT MULTIVITAMIN W/MINERALS CH
1.0000 | ORAL_TABLET | Freq: Every day | ORAL | Status: DC
  Administered 2024-01-04 – 2024-01-07 (×4): 1 via ORAL
  Filled 2024-01-04 (×4): qty 1

## 2024-01-04 MED ORDER — QUETIAPINE FUMARATE 100 MG PO TABS
100.0000 mg | ORAL_TABLET | Freq: Every day | ORAL | Status: DC
  Administered 2024-01-04 – 2024-01-05 (×2): 100 mg via ORAL
  Filled 2024-01-04 (×2): qty 1

## 2024-01-04 MED ORDER — LORAZEPAM 1 MG PO TABS
1.0000 mg | ORAL_TABLET | Freq: Once | ORAL | Status: AC
  Administered 2024-01-04: 1 mg via ORAL
  Filled 2024-01-04: qty 1

## 2024-01-04 MED ORDER — DULOXETINE HCL 30 MG PO CPEP
30.0000 mg | ORAL_CAPSULE | Freq: Every day | ORAL | Status: DC
  Administered 2024-01-05 – 2024-01-07 (×3): 30 mg via ORAL
  Filled 2024-01-04 (×4): qty 1

## 2024-01-04 MED ORDER — OLANZAPINE 10 MG PO TABS
10.0000 mg | ORAL_TABLET | Freq: Two times a day (BID) | ORAL | Status: DC | PRN
Start: 2024-01-04 — End: 2024-01-07

## 2024-01-04 MED ORDER — DULOXETINE HCL 30 MG PO CPEP
60.0000 mg | ORAL_CAPSULE | Freq: Every day | ORAL | Status: DC
Start: 2024-01-04 — End: 2024-01-07
  Administered 2024-01-04 – 2024-01-06 (×3): 60 mg via ORAL
  Filled 2024-01-04 (×3): qty 2

## 2024-01-04 MED ORDER — SPIRONOLACTONE 25 MG PO TABS
25.0000 mg | ORAL_TABLET | Freq: Every day | ORAL | Status: DC
Start: 2024-01-04 — End: 2024-01-07
  Administered 2024-01-04 – 2024-01-07 (×4): 25 mg via ORAL
  Filled 2024-01-04 (×5): qty 1

## 2024-01-04 MED ORDER — SACUBITRIL-VALSARTAN 49-51 MG PO TABS
1.0000 | ORAL_TABLET | Freq: Two times a day (BID) | ORAL | Status: DC
  Administered 2024-01-05 – 2024-01-07 (×5): 1 via ORAL
  Filled 2024-01-04 (×7): qty 1

## 2024-01-04 MED ORDER — HYDROCORTISONE ACETATE 25 MG RE SUPP
25.0000 mg | Freq: Every day | RECTAL | Status: AC
  Administered 2024-01-04: 25 mg via RECTAL
  Filled 2024-01-04: qty 1

## 2024-01-04 MED ORDER — OLANZAPINE 10 MG IM SOLR: 10.0000 mg | Freq: Two times a day (BID) | INTRAMUSCULAR | Status: DC | PRN

## 2024-01-04 MED ORDER — OXYCODONE HCL 5 MG PO TABS
10.0000 mg | ORAL_TABLET | Freq: Four times a day (QID) | ORAL | Status: DC | PRN
  Administered 2024-01-05 – 2024-01-07 (×7): 10 mg via ORAL
  Filled 2024-01-04 (×7): qty 2

## 2024-01-04 MED ORDER — ATORVASTATIN CALCIUM 20 MG PO TABS
80.0000 mg | ORAL_TABLET | Freq: Every day | ORAL | Status: DC
  Administered 2024-01-04 – 2024-01-06 (×3): 80 mg via ORAL
  Filled 2024-01-04 (×3): qty 4

## 2024-01-04 MED ORDER — POTASSIUM CHLORIDE CRYS ER 20 MEQ PO TBCR
20.0000 meq | EXTENDED_RELEASE_TABLET | Freq: Every day | ORAL | Status: AC
  Filled 2024-01-04: qty 1

## 2024-01-04 MED ORDER — ALUM & MAG HYDROXIDE-SIMETH 200-200-20 MG/5ML PO SUSP: 30.0000 mL | ORAL | Status: DC | PRN

## 2024-01-04 MED ORDER — TIZANIDINE HCL 2 MG PO TABS
2.0000 mg | ORAL_TABLET | Freq: Once | ORAL | Status: AC
  Administered 2024-01-04: 2 mg via ORAL
  Filled 2024-01-04: qty 1

## 2024-01-04 MED ORDER — POLYETHYLENE GLYCOL 3350 17 G PO PACK: 17.0000 g | PACK | Freq: Every day | ORAL | Status: DC | PRN

## 2024-01-04 MED ORDER — THIAMINE HCL 100 MG PO TABS
100.0000 mg | ORAL_TABLET | Freq: Every day | ORAL | Status: DC
  Administered 2024-01-04 – 2024-01-07 (×4): 100 mg via ORAL
  Filled 2024-01-04 (×8): qty 1

## 2024-01-04 MED ORDER — CARVEDILOL 6.25 MG PO TABS
6.2500 mg | ORAL_TABLET | Freq: Two times a day (BID) | ORAL | Status: DC
Start: 2024-01-04 — End: 2024-01-07
  Administered 2024-01-05 – 2024-01-07 (×5): 6.25 mg via ORAL
  Filled 2024-01-04 (×7): qty 1

## 2024-01-04 MED ORDER — MAGNESIUM HYDROXIDE 400 MG/5ML PO SUSP: 30.0000 mL | Freq: Every day | ORAL | Status: DC | PRN
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
## 2024-01-04 NOTE — Progress Notes (Signed)
Patient refused CBG. 

## 2024-01-04 NOTE — Progress Notes (Signed)
 [facility] department came and escort patient to [facility].  Attempted to call report [facility], with a phone number left for a return call and successful report given to [place], Charity fundraiser.

## 2024-01-04 NOTE — Plan of Care (Signed)

## 2024-01-04 NOTE — Progress Notes (Signed)
 No new complaints.  Awaiting transfer to inpatient [facility].
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
## 2024-01-04 NOTE — Group Note (Signed)
 [facility] LCSW Group Therapy Note    Group Date: 01/04/2024 Start Time: 1300 End Time: 1400  Type of Therapy and Topic:  Group Therapy:  Overcoming Obstacles  Participation Level:  [facility] PARTICIPATION LEVEL: Did Not Attend   Description of Group:   In this group patients will be encouraged to explore what they see as obstacles to their own wellness and recovery. They will be guided to discuss their thoughts, feelings, and behaviors related to these obstacles. The group will process together ways to cope with barriers, with attention given to specific choices patients can make. Each patient will be challenged to identify changes they are motivated to make in order to overcome their obstacles. This group will be process-oriented, with patients participating in exploration of their own experiences as well as giving and receiving support and challenge from other group members.  Therapeutic Goals: 1. Patient will identify personal and current obstacles as they relate to admission. 2. Patient will identify barriers that currently interfere with their wellness or overcoming obstacles.  3. Patient will identify feelings, thought process and behaviors related to these barriers. 4. Patient will identify two changes they are willing to make to overcome these obstacles:    Summary of Patient Progress Patient did not attend.    Therapeutic Modalities:   Cognitive Behavioral Therapy Solution Focused Therapy Motivational Interviewing Relapse Prevention Therapy   Glenis Smoker, LCSW

## 2024-01-04 NOTE — Group Note (Signed)
 Date:  01/04/2024 Time:  5:22 PM  Group Topic/Focus:  Wellness Toolbox:   The focus of this group is to discuss various aspects of wellness, balancing those aspects and exploring ways to increase the ability to experience wellness.  Patients will create a wellness toolbox for use upon discharge.    Participation Level:  Minimal  Participation Quality:  Appropriate  Affect:  Appropriate  Cognitive:  Appropriate  Insight: Appropriate  Engagement in Group:  Engaged  Modes of Intervention:  Activity  Additional Comments:    Wilford Corner 01/04/2024, 5:22 PM

## 2024-01-04 NOTE — Progress Notes (Signed)
 Patient was supposed to go to [facility] inpatient psych center.  However transportation could not be arranged over the weekend with Sheriff's office.  Hopefully he will be transferred to [facility] today.

## 2024-01-04 NOTE — Group Note (Signed)
 Recreation Therapy Group Note   Group Topic:Coping Skills  Group Date: 01/04/2024 Start Time: 1000 End Time: 1055 Facilitators: Rosina Lowenstein, LRT, CTRS Location:  [facility]  Group Description: Coping A-Z. LRT and patients engage in a guided discussion on what coping skills are and gave specific examples. LRT passed out a handout labeled Coping A-Z with blank spaces beside each letter. LRT prompted patients to come up with a coping skill for each of the letters. LRT and patients went over the handout and gave ideas for each letter if anyone had any blanks left on their paper. Patients kept this handout with them that listed 26 different coping skills.   Goal Area(s) Addressed: Patients will be able to define "coping skills". Patient will identify new coping skills.  Patient will increase communication.   Affect/Mood: N/A   Participation Level: Did not attend    Clinical Observations/Individualized Feedback: Patient did not attend due to being a new admission.   Plan: Continue to engage patient in RT group sessions 2-3x/week.   Rosina Lowenstein, LRT, CTRS 01/04/2024 1:26 PM

## 2024-01-05 ENCOUNTER — Encounter: Payer: Self-pay | Admitting: Psychiatry

## 2024-01-05 ENCOUNTER — Ambulatory Visit: Payer: MEDICAID | Admitting: Podiatry

## 2024-01-05 DIAGNOSIS — F332 Major depressive disorder, recurrent severe without psychotic features: Secondary | ICD-10-CM

## 2024-01-05 LAB — GLUCOSE, CAPILLARY
Glucose-Capillary: 147 mg/dL — ABNORMAL HIGH (ref 70–99)
Glucose-Capillary: 99 mg/dL (ref 70–99)
Glucose-Capillary: 139 mg/dL — ABNORMAL HIGH (ref 70–99)

## 2024-01-05 NOTE — Progress Notes (Signed)
   01/05/24 1200  CIWA-Ar  Nausea and Vomiting 0  Tactile Disturbances 0  Tremor 0  Auditory Disturbances 0  Paroxysmal Sweats 0  Visual Disturbances 0  Anxiety 0  Headache, Fullness in Head 0  Agitation 0  Orientation and Clouding of Sensorium 0  CIWA-Ar Total 0

## 2024-01-05 NOTE — Group Note (Signed)
 Date:  01/05/2024 Time:  11:00 PM  Group Topic/Focus:  Wrap-Up Group:   The focus of this group is to help patients review their daily goal of treatment and discuss progress on daily workbooks.    Participation Level:  Active  Participation Quality:  Appropriate  Affect:  Appropriate  Cognitive:  Appropriate  Insight: Appropriate  Engagement in Group:  Engaged  Modes of Intervention:  Discussion    Jacob Adams 01/05/2024, 11:00 PM

## 2024-01-05 NOTE — Group Note (Signed)
 [facility] LCSW Group Therapy Note   Group Date: 01/05/2024 Start Time: 1300 End Time: 1400   Type of Therapy and Topic: Group Therapy: Avoiding Self-Sabotaging and Enabling Behaviors  Participation Level: Active  Mood: Engaged  Description of Group:  In this group, patients will learn how to identify obstacles, self-sabotaging and enabling behaviors, as well as: what are they, why do we do them and what needs these behaviors meet. Discuss unhealthy relationships and how to have positive healthy boundaries with those that sabotage and enable. Explore aspects of self-sabotage and enabling in yourself and how to limit these self-destructive behaviors in everyday life.   Therapeutic Goals: 1. Patient will identify one obstacle that relates to self-sabotage and enabling behaviors 2. Patient will identify one personal self-sabotaging or enabling behavior they did prior to admission 3. Patient will state a plan to change the above identified behavior 4. Patient will demonstrate ability to communicate their needs through discussion and/or role play.    Summary of Patient Progress: Patient actively participated in group, sharing personal experiences in navigating complex situations while demonstrating self-restraint, awareness, and effective communication. They were engaged, receptive to feedback, and contributed to fostering a supportive environment that encouraged peers to share and learn.    Therapeutic Modalities:  Cognitive Behavioral Therapy Person-Centered Therapy Motivational Interviewing    Lowry Ram, LCSW

## 2024-01-05 NOTE — [facility] Suicide Risk Assessment (Signed)
 [facility] INPATIENT:  Family/Significant Other Suicide Prevention Education  Suicide Prevention Education:  Patient Refusal for Family/Significant Other Suicide Prevention Education: The patient Jacob Adams has refused to provide written consent for family/significant other to be provided Family/Significant Other Suicide Prevention Education during admission and/or prior to discharge.  Physician notified.  SPE completed with pt, as pt refused to consent to family contact. SPI pamphlet provided to pt and pt was encouraged to share information with support network, ask questions, and talk about any concerns relating to SPE. Pt denies access to guns/firearms and verbalized understanding of information provided. Mobile Crisis information also provided to pt.    Harden Mo 01/05/2024, 4:08 PM

## 2024-01-05 NOTE — Progress Notes (Signed)
   01/05/24 1000  Psych Admission Type (Psych Patients Only)  Admission Status Involuntary  Psychosocial Assessment  Patient Complaints Sleep disturbance;Other (Comment) (patient states that he only got about 3-4 hours of sleep last night and that he usually takes Seroquel at home, but didn't receive it here; patient reports a headache but normally takes [place] and that medication has not been ordered as of yet.)  Eye Contact Fair  Facial Expression Other (Comment) (appropriate)  Affect Appropriate to circumstance  Speech Logical/coherent  Interaction Assertive  Motor Activity Slow  Appearance/Hygiene In scrubs;Unremarkable  Behavior Characteristics Cooperative;Appropriate to situation  Mood Pleasant (patient's goal for today, per his self-inventory, is to "go home".)  Aggressive Behavior  Effect No apparent injury  Thought Process  Coherency WDL  Content WDL  Delusions None reported or observed  Perception WDL  Hallucination None reported or observed  Judgment WDL  Confusion None  Danger to Self  Current suicidal ideation? Denies  Agreement Not to Harm Self Yes  Description of Agreement Verbal  Danger to Others  Danger to Others None reported or observed
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
## 2024-01-05 NOTE — Group Note (Signed)
 Recreation Therapy Group Note   Group Topic:Healthy Support Systems  Group Date: 01/05/2024 Start Time: 1000 End Time: 1055 Facilitators: Rosina Lowenstein, LRT, CTRS Location: Craft Room  Group Description: Straw Bridge. Individually, patients were given 10 plastic drinking straws and an equal length of masking tape. Using the materials provided, patients were instructed to build a free-standing bridge-like structure to suspend an everyday item (ex: puzzle box) off the floor or table surface. All materials were required to be used in Secondary school teacher. LRT facilitated post-activity discussion reviewing how we, humans, are like the structure we built; when things get too heavy in our life and we do not have adequate supports/coping skills, then we will fall just like the straw-built structure will. LRT focused on how having a "base" or structure on the bottom was necessary for the object to stand, meaning we must be secure and stable first before building on ourselves or others. Patients were encouraged to name 2 healthy supports in their life and reflect on how the skills used in this activity can be generalized to daily life post discharge.  Goal Area(s) Addressed:  Patient will identify two healthy support systems in their life. Patient will work on Product manager. Patient will verbalize the importance of having a strong and steady "base".  Patient will follow multi-step directions. Patients will engage in creativity and use all provided materials.   Affect/Mood: Appropriate   Participation Level: Active and Engaged   Participation Quality: Independent   Behavior: Calm and Cooperative   Speech/Thought Process: Coherent   Insight: Fair   Judgement: Good   Modes of Intervention: Guided Discussion, Problem-solving, and STEM Activity   Patient Response to Interventions:  Attentive and Receptive   Education Outcome:  Acknowledges education   Clinical  Observations/Individualized Feedback: Jacob Adams was active in their participation of session activities and group discussion. Pt identified "my counselor and my own attitude" as his supports. Pt successfully completed a straw structure. Pt interacted well with LRT and peers duration of session.    Plan: Continue to engage patient in RT group sessions 2-3x/week.   Rosina Lowenstein, LRT, CTRS 01/05/2024 12:47 PM

## 2024-01-05 NOTE — Group Note (Signed)
 Date:  01/05/2024 Time:  10:11 AM  Group Topic/Focus:  Goals Group:   The focus of this group is to help patients establish daily goals to achieve during treatment and discuss how the patient can incorporate goal setting into their daily lives to aide in recovery.    Participation Level:  Active  Participation Quality:  Appropriate  Affect:  Appropriate  Cognitive:  Appropriate  Insight: Appropriate  Engagement in Group:  Engaged  Modes of Intervention:  Discussion, Education, and Support  Additional Comments:    Wilford Corner 01/05/2024, 10:11 AM

## 2024-01-05 NOTE — Progress Notes (Signed)
   01/05/24 1800  CIWA-Ar  Nausea and Vomiting 0  Tactile Disturbances 0  Tremor 0  Auditory Disturbances 0  Paroxysmal Sweats 0  Visual Disturbances 0  Anxiety 0  Headache, Fullness in Head 0  Agitation 0  Orientation and Clouding of Sensorium 0  CIWA-Ar Total 0

## 2024-01-05 NOTE — Plan of Care (Signed)

## 2024-01-05 NOTE — Progress Notes (Signed)
 Pt calm and pleasant during assessment denying SI/HI/AVH. Pt stated he was feeling better today and is hopeful that his medications will help. Pt given education. Pt compliant with medication administration per MD orders. Pt given education, support, and encouragement to be active in his treatment plan. Pt being monitored Q 15 minutes for safety per unit protocol, remains safe on the unit
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
## 2024-01-05 NOTE — Plan of Care (Signed)
  Problem: Education: Goal: Knowledge of [facility] Education information/materials will improve Outcome: Progressing Goal: Emotional status will improve Outcome: Progressing Goal: Mental status will improve Outcome: Progressing   Problem: Activity: Goal: Interest or engagement in activities will improve Outcome: Progressing Goal: Sleeping patterns will improve Outcome: Progressing   

## 2024-01-05 NOTE — BH Assessment (Signed)
 Patient denies SI,HI and Avh. Patient calm and cooperative. But does seem to have a labile mood.Patient CBG Taken a bedtime per md order.Patient remains safe on the unit. No questions or concerns at the moment.Q15 Safety checks are being performed.

## 2024-01-05 NOTE — BHH Suicide Risk Assessment (Signed)
 [facility] Admission Suicide Risk Assessment   Nursing information obtained from:  Patient Demographic factors:  Male Historical Factors:  Impulsivity Risk Reduction Factors:  Living with another person, especially a relative, Positive social support  Principal Problem: Major depressive disorder, recurrent severe without psychotic features (HCC) Diagnosis:  Principal Problem:   Major depressive disorder, recurrent severe without psychotic features (HCC) Active Problems:   Alcohol use disorder, severe, dependence (HCC)   Tobacco dependence  Subjective Data: Jacob Adams is a 58 y.o. male who initially came to the ER on or around 12/31/2023  for depression. He carries the psychiatric diagnoses of PTSD, DID, MDD< schizophrenia, and bipolar disorder and has a past medical history of  cirrohsis, portal hypertensive gastropathy, CHF, hypothyroidism, UColitis, and GERD.  Patient was initially admitted to medical floor at [facility] due to GI symptoms and low glucose levels.  Patient has been medically cleared and transferred to [facility] for stabilization of depression and suicide thoughts.   Alcohol Use Disorder Identification Test Final Score (AUDIT): 6 The "Alcohol Use Disorders Identification Test", Guidelines for Use in Primary Care, Second Edition.  World Science writer [facility]). Score between 0-7:  no or low risk or alcohol related problems. Score between 8-15:  moderate risk of alcohol related problems. Score between 16-19:  high risk of alcohol related problems. Score 20 or above:  warrants further diagnostic evaluation for alcohol dependence and treatment.   CLINICAL FACTORS:   Depression:   Anhedonia Hopelessness Insomnia Alcohol/Substance Abuse/Dependencies Previous Psychiatric Diagnoses and Treatments Medical Diagnoses and Treatments/Surgeries   Musculoskeletal: Strength & Muscle Tone: within normal limits Gait & Station: normal Patient leans: N/A  Psychiatric Specialty  Exam:  Presentation  General Appearance:  Appropriate for Environment  Eye Contact: Fair  Speech: Clear and Coherent  Speech Volume: Decreased   Mood and Affect  Mood: Anxious; Depressed  Affect: Congruent; Constricted   Thought Process  Thought Processes: Goal Directed  Descriptions of Associations:Intact  Orientation:Full (Time, Place and Person)  Thought Content:Rumination  History of Schizophrenia/Schizoaffective disorder:No  Duration of Psychotic Symptoms:NA Hallucinations:Hallucinations: None  Ideas of Reference:None  Suicidal Thoughts:Suicidal Thoughts: Yes, Active SI Active Intent and/or Plan: Without Intent  Homicidal Thoughts:Homicidal Thoughts: No   Sensorium  Memory: Immediate Fair; Recent Fair; Remote Fair  Judgment: Impaired  Insight: Shallow   Executive Functions  Concentration: Fair  Attention Span: Fair   Fund of Knowledge: Fair  Language: Fair   Psychomotor Activity  Psychomotor Activity: Psychomotor Activity: Decreased   Assets  Assets: Communication Skills; Desire for Improvement   Sleep  Sleep:No data recorded   Physical Exam: Physical Exam Constitutional:      Appearance: Normal appearance.  HENT:     Head: Normocephalic and atraumatic.     Nose: No congestion.  Eyes:     Pupils: Pupils are equal, round, and reactive to light.  Cardiovascular:     Rate and Rhythm: Regular rhythm.  Pulmonary:     Effort: Pulmonary effort is normal.  Skin:    General: Skin is warm.  Neurological:     Mental Status: He is alert and oriented to person, place, and time.    Review of Systems  Constitutional:  Negative for chills and fever.  HENT:  Negative for hearing loss and sore throat.   Eyes:  Negative for blurred vision and double vision.  Respiratory:  Negative for cough and shortness of breath.   Cardiovascular:  Negative for chest pain and palpitations.  Gastrointestinal:  Negative for nausea and  vomiting.  Psychiatric/Behavioral:  Positive for depression, substance abuse and suicidal ideas. The patient is nervous/anxious.    Blood pressure 128/85, pulse 72, temperature 97.9 F (36.6 C), resp. rate (!) 21, height 5\' 10"  (1.778 m), weight 107 kg, SpO2 97%. Body mass index is 33.86 kg/m.   COGNITIVE FEATURES THAT CONTRIBUTE TO RISK:  Thought constriction (tunnel vision)    SUICIDE RISK:   Moderate:  Frequent suicidal ideation with limited intensity, and duration, some specificity in terms of plans, no associated intent, good self-control .  PLAN OF CARE: Per H&P  I certify that inpatient services furnished can reasonably be expected to improve the patient's condition.   Lewanda Rife, MD 01/05/2024, 12:22 PM

## 2024-01-05 NOTE — Group Note (Unsigned)
 Date:  01/05/2024 Time:  10:55 PM  Group Topic/Focus:  Wrap-Up Group:   The focus of this group is to help patients review their daily goal of treatment and discuss progress on daily workbooks.     Participation Level:  {BHH PARTICIPATION ZOXWR:60454}  Participation Quality:  {BHH PARTICIPATION QUALITY:22265}  Affect:  {BHH AFFECT:22266}  Cognitive:  {BHH COGNITIVE:22267}  Insight: {BHH Insight2:20797}  Engagement in Group:  {BHH ENGAGEMENT IN UJWJX:91478}  Modes of Intervention:  {BHH MODES OF INTERVENTION:22269}  Additional Comments:  ***  Lenore Cordia 01/05/2024, 10:55 PM

## 2024-01-05 NOTE — BHH Group Notes (Signed)
 BHH Group Notes:  (Nursing/MHT/Case Management/Adjunct)  Date:  01/05/2024  Time:  1:33 AM  Type of Therapy:  Psychoeducational Skills  Participation Level:  Minimal  Participation Quality:  Resistant  Affect:  Flat  Cognitive:  Lacking  Insight:  None  Engagement in Group:  Limited  Modes of Intervention:  Education  Summary of Progress/Problems: Patient rated his day as a 1 out of 10 since he is "here" in the hospital. He did not elaborate any further nor did he have anything to say about his day.   Kuper Rennels S 01/05/2024, 1:33 AM

## 2024-01-06 MED ORDER — DULOXETINE HCL 30 MG PO CPEP: ORAL_CAPSULE | ORAL | 0 refills | Status: DC

## 2024-01-06 MED ORDER — ENTRESTO 49-51 MG PO TABS: 1.0000 | ORAL_TABLET | Freq: Two times a day (BID) | ORAL | 0 refills | Status: AC

## 2024-01-06 MED ORDER — CARVEDILOL 6.25 MG PO TABS: 6.2500 mg | ORAL_TABLET | Freq: Two times a day (BID) | ORAL | 0 refills | Status: DC

## 2024-01-06 MED ORDER — QUETIAPINE FUMARATE 200 MG PO TABS
200.0000 mg | ORAL_TABLET | Freq: Every day | ORAL | Status: DC
  Administered 2024-01-06: 200 mg via ORAL
  Filled 2024-01-06: qty 1

## 2024-01-06 MED ORDER — ATORVASTATIN CALCIUM 80 MG PO TABS: 80.0000 mg | ORAL_TABLET | Freq: Every day | ORAL | 0 refills | Status: AC

## 2024-01-06 MED ORDER — OXYCODONE HCL 10 MG PO TABS: 10.0000 mg | ORAL_TABLET | Freq: Four times a day (QID) | ORAL | 0 refills | Status: DC | PRN

## 2024-01-06 MED ORDER — QUETIAPINE FUMARATE 200 MG PO TABS: 200.0000 mg | ORAL_TABLET | Freq: Every day | ORAL | 0 refills | Status: DC

## 2024-01-06 MED ORDER — SPIRONOLACTONE 25 MG PO TABS: 25.0000 mg | ORAL_TABLET | Freq: Every day | ORAL | 0 refills | Status: DC

## 2024-01-06 NOTE — Group Note (Signed)
 Date:  01/06/2024 Time:  5:08 PM  Group Topic/Focus:  Activity Group: The focus of the group is to promote activity for the patients and encourage them to go outside to the courtyard for some fresh air and exercise.    Participation Level:  Active  Participation Quality:  Appropriate  Affect:  Appropriate  Cognitive:  Appropriate  Insight: Appropriate  Engagement in Group:  Engaged  Modes of Intervention:  Activity  Additional Comments:    Jacob Adams 01/06/2024, 5:08 PM

## 2024-01-06 NOTE — Group Note (Signed)
 Date:  01/06/2024 Time:  9:49 PM  Group Topic/Focus:  Personal Choices and Values:   The focus of this group is to help patients assess and explore the importance of values in their lives, how their values affect their decisions, how they express their values and what opposes their expression.    Participation Level:  Active  Participation Quality:  Appropriate, Sharing, and Supportive  Affect:  Appropriate  Cognitive:  Appropriate  Insight: Appropriate  Engagement in Group:  Engaged and Supportive  Modes of Intervention:  Discussion  Additional Comments:     Belva Crome 01/06/2024, 9:49 PM

## 2024-01-06 NOTE — Plan of Care (Signed)
   Problem: Education: Goal: Knowledge of [facility] Education information/materials will improve Outcome: Progressing Goal: Emotional status will improve Outcome: Progressing Goal: Mental status will improve Outcome: Progressing

## 2024-01-06 NOTE — BH IP Treatment Plan (Signed)
 Interdisciplinary Treatment and Diagnostic Plan Update  01/06/2024 Time of Session: 10:31 Jacob Adams MRN: [id number]  Principal Diagnosis: Major depressive disorder, recurrent severe without psychotic features (HCC)  Secondary Diagnoses: Principal Problem:   Major depressive disorder, recurrent severe without psychotic features (HCC) Active Problems:   Alcohol use disorder, severe, dependence (HCC)   Tobacco dependence   Current Medications:  Current Facility-Administered Medications  Medication Dose Route Frequency Provider Last Rate Last Admin   alum & mag hydroxide-simeth (MAALOX/MYLANTA) 200-200-20 MG/5ML suspension 30 mL  30 mL Oral Q4H PRN Charm Rings, NP       alum & mag hydroxide-simeth (MAALOX/MYLANTA) 200-200-20 MG/5ML suspension 30 mL  30 mL Oral Q4H PRN Charm Rings, NP       atorvastatin (LIPITOR) tablet 80 mg  80 mg Oral QHS Charm Rings, NP   80 mg at 01/05/24 2110   carvedilol (COREG) tablet 6.25 mg  6.25 mg Oral BID Charm Rings, NP   6.25 mg at 01/05/24 1822   DULoxetine (CYMBALTA) DR capsule 30 mg  30 mg Oral Daily Charm Rings, NP   30 mg at 01/06/24 8841   And   DULoxetine (CYMBALTA) DR capsule 60 mg  60 mg Oral QHS Charm Rings, NP   60 mg at 01/05/24 2111   magnesium hydroxide (MILK OF MAGNESIA) suspension 30 mL  30 mL Oral Daily PRN Charm Rings, NP       multivitamin with minerals tablet 1 tablet  1 tablet Oral Daily Charm Rings, NP   1 tablet at 01/06/24 0852   OLANZapine (ZYPREXA) tablet 10 mg  10 mg Oral BID PRN Charm Rings, NP       Or   OLANZapine (ZYPREXA) injection 10 mg  10 mg Intramuscular BID PRN Charm Rings, NP       oxyCODONE (Oxy IR/ROXICODONE) immediate release tablet 10 mg  10 mg Oral Q6H PRN Charm Rings, NP   10 mg at 01/06/24 0653   polyethylene glycol (MIRALAX / GLYCOLAX) packet 17 g  17 g Oral Daily PRN Charm Rings, NP       QUEtiapine (SEROQUEL) tablet 100 mg  100 mg Oral QHS Charm Rings, NP    100 mg at 01/05/24 2112   sacubitril-valsartan (ENTRESTO) 49-51 mg per tablet  1 tablet Oral BID Charm Rings, NP   1 tablet at 01/05/24 6606   spironolactone (ALDACTONE) tablet 25 mg  25 mg Oral Daily Charm Rings, NP   25 mg at 01/05/24 3016   thiamine (VITAMIN B1) tablet 100 mg  100 mg Oral Daily Charm Rings, NP   100 mg at 01/06/24 0109   PTA Medications: Medications Prior to Admission  Medication Sig Dispense Refill Last Dose/Taking   alprostadil (EDEX) 40 MCG injection 40 mcg by Intracavitary route as needed for erectile dysfunction. use no more than 3 times per week      atorvastatin (LIPITOR) 80 MG tablet Take 80 mg by mouth at bedtime.      carvedilol (COREG) 6.25 MG tablet Take 6.25 mg by mouth 2 (two) times daily.      cyclobenzaprine (FLEXERIL) 10 MG tablet Take 10 mg by mouth daily as needed for muscle spasms. (Patient not taking: Reported on 12/31/2023)      DULoxetine (CYMBALTA) 30 MG capsule Take 30 mg by mouth 2 (two) times daily.      ENTRESTO 49-51 MG Take 1 tablet by mouth  2 (two) times daily.      FARXIGA 10 MG TABS tablet Take 10 mg by mouth every morning.      folic acid (FOLVITE) 1 MG tablet Take 1 mg by mouth daily. (Patient not taking: Reported on 12/31/2023)      Galcanezumab-gnlm (EMGALITY) 120 MG/ML SOAJ Inject 1 Pen into the skin every 30 (thirty) days. 1.12 mL 6    [EXPIRED] hydrocortisone (ANUSOL-HC) 25 MG suppository Place 1 suppository (25 mg total) rectally at bedtime for 3 days.      lidocaine (XYLOCAINE) 5 % ointment Apply 1 application  topically 2 (two) times daily as needed (knee and joint pain).      metFORMIN (GLUCOPHAGE) 500 MG tablet Take 500 mg by mouth every morning.      Multiple Vitamin (MULTIVITAMIN WITH MINERALS) TABS tablet Take 1 tablet by mouth daily.      naltrexone (DEPADE) 50 MG tablet Take 50 mg by mouth in the morning.      OLANZapine (ZYPREXA) 10 MG tablet Take 1 tablet (10 mg total) by mouth 2 (two) times daily as needed  (agitation).      pantoprazole (PROTONIX) 40 MG tablet Take 1 tablet (40 mg total) by mouth daily. 30 tablet 1    QUEtiapine (SEROQUEL XR) 400 MG 24 hr tablet Take 3 tablets by mouth at bedtime.      spironolactone (ALDACTONE) 25 MG tablet Take 25 mg by mouth daily.      thiamine 100 MG tablet Take 1 tablet (100 mg total) by mouth daily. 30 tablet 1    Ubrogepant (UBRELVY) 50 MG TABS Take 50 mg by mouth as needed. (Patient taking differently: Take 50 mg by mouth as needed (migraines).) 16 tablet 6     Patient Stressors:    Patient Strengths:    Treatment Modalities: Medication Management, Group therapy, Case management,  1 to 1 session with clinician, Psychoeducation, Recreational therapy.   Physician Treatment Plan for Primary Diagnosis: Major depressive disorder, recurrent severe without psychotic features (HCC) Long Term Goal(s): Improvement in symptoms so as ready for discharge   Short Term Goals: Ability to identify changes in lifestyle to reduce recurrence of condition will improve Ability to verbalize feelings will improve Ability to disclose and discuss suicidal ideas Ability to demonstrate self-control will improve Ability to identify and develop effective coping behaviors will improve Ability to maintain clinical measurements within normal limits will improve Compliance with prescribed medications will improve Ability to identify triggers associated with substance abuse/mental health issues will improve  Medication Management: Evaluate patient's response, side effects, and tolerance of medication regimen.  Therapeutic Interventions: 1 to 1 sessions, Unit Group sessions and Medication administration.  Evaluation of Outcomes: Progressing  Physician Treatment Plan for Secondary Diagnosis: Principal Problem:   Major depressive disorder, recurrent severe without psychotic features (HCC) Active Problems:   Alcohol use disorder, severe, dependence (HCC)   Tobacco  dependence  Long Term Goal(s): Improvement in symptoms so as ready for discharge   Short Term Goals: Ability to identify changes in lifestyle to reduce recurrence of condition will improve Ability to verbalize feelings will improve Ability to disclose and discuss suicidal ideas Ability to demonstrate self-control will improve Ability to identify and develop effective coping behaviors will improve Ability to maintain clinical measurements within normal limits will improve Compliance with prescribed medications will improve Ability to identify triggers associated with substance abuse/mental health issues will improve     Medication Management: Evaluate patient's response, side effects, and tolerance of  medication regimen.  Therapeutic Interventions: 1 to 1 sessions, Unit Group sessions and Medication administration.  Evaluation of Outcomes: Progressing   RN Treatment Plan for Primary Diagnosis: Major depressive disorder, recurrent severe without psychotic features (HCC) Long Term Goal(s): Knowledge of disease and therapeutic regimen to maintain health will improve  Short Term Goals: Ability to remain free from injury will improve, Ability to verbalize frustration and anger appropriately will improve, Ability to demonstrate self-control, Ability to participate in decision making will improve, Ability to verbalize feelings will improve, Ability to disclose and discuss suicidal ideas, Ability to identify and develop effective coping behaviors will improve, and Compliance with prescribed medications will improve  Medication Management: RN will administer medications as ordered by provider, will assess and evaluate patient's response and provide education to patient for prescribed medication. RN will report any adverse and/or side effects to prescribing provider.  Therapeutic Interventions: 1 on 1 counseling sessions, Psychoeducation, Medication administration, Evaluate responses to treatment,  Monitor vital signs and CBGs as ordered, Perform/monitor CIWA, COWS, AIMS and Fall Risk screenings as ordered, Perform wound care treatments as ordered.  Evaluation of Outcomes: Progressing   LCSW Treatment Plan for Primary Diagnosis: Major depressive disorder, recurrent severe without psychotic features (HCC) Long Term Goal(s): Safe transition to appropriate next level of care at discharge, Engage patient in therapeutic group addressing interpersonal concerns.  Short Term Goals: Engage patient in aftercare planning with referrals and resources, Increase social support, Increase ability to appropriately verbalize feelings, Increase emotional regulation, Facilitate acceptance of mental health diagnosis and concerns, Identify triggers associated with mental health/substance abuse issues, and Increase skills for wellness and recovery  Therapeutic Interventions: Assess for all discharge needs, 1 to 1 time with Social worker, Explore available resources and support systems, Assess for adequacy in community support network, Educate family and significant other(s) on suicide prevention, Complete Psychosocial Assessment, Interpersonal group therapy.  Evaluation of Outcomes: Progressing   Progress in Treatment: Attending groups: Yes. Participating in groups: Yes. Taking medication as prescribed: Yes. Toleration medication: Yes. Family/Significant other contact made: No, will contact:  if given permission. Patient understands diagnosis: Yes. Discussing patient identified problems/goals with staff: Yes. Medical problems stabilized or resolved: Yes. Denies suicidal/homicidal ideation: Yes. Issues/concerns per patient self-inventory: No. Other: none.  New problem(s) identified: No, Describe:  none identified.   New Short Term/Long Term Goal(s): medication management for mood stabilization; elimination of SI thoughts; development of comprehensive mental wellness plan.  Patient Goals:  "To go home."    Discharge Plan or Barriers: CSW will assist pt with development of an appropriate aftercare/discharge plan.   Reason for Continuation of Hospitalization: Anxiety Depression Medication stabilization Suicidal ideation  Estimated Length of Stay: 1-7 days  Last 3 [place] Suicide Severity Risk Score: Flowsheet Row Admission (Current) from 01/04/2024 in [facility] ED to Hosp-Admission (Discharged) from 12/31/2023 in [facility] ED from 06/11/2023 in [facility])  C-SSRS RISK CATEGORY No Risk No Risk No Risk       Last PHQ 2/9 Scores:    06/29/2020   12:10 PM  Depression screen PHQ 2/9  Decreased Interest 2  Down, Depressed, Hopeless 2  PHQ - 2 Score 4  Altered sleeping 3  Tired, decreased energy 2  Change in appetite 3  Feeling bad or failure about yourself  3  Trouble concentrating 3  Moving slowly or fidgety/restless 0  Suicidal thoughts 1  PHQ-9 Score 19  Difficult doing  work/chores Somewhat difficult    Scribe for Treatment Team: Glenis Smoker, [place] 01/06/2024 10:41 AM

## 2024-01-06 NOTE — Group Note (Signed)
 [facility] LCSW Group Therapy Note   Group Date: 01/06/2024 Start Time: 1300 End Time: 1400   Type of Therapy/Topic:  Group Therapy:  Emotion Regulation  Participation Level:  None   Mood:  Description of Group:    The purpose of this group is to assist patients in learning to regulate negative emotions and experience positive emotions. Patients will be guided to discuss ways in which they have been vulnerable to their negative emotions. These vulnerabilities will be juxtaposed with experiences of positive emotions or situations, and patients challenged to use positive emotions to combat negative ones. Special emphasis will be placed on coping with negative emotions in conflict situations, and patients will process healthy conflict resolution skills.  Therapeutic Goals: Patient will identify two positive emotions or experiences to reflect on in order to balance out negative emotions:  Patient will label two or more emotions that they find the most difficult to experience:  Patient will be able to demonstrate positive conflict resolution skills through discussion or role plays:   Summary of Patient Progress: Patient was present in group though did not participate in group discussion.      Therapeutic Modalities:   Cognitive Behavioral Therapy Feelings Identification Dialectical Behavioral Therapy   Harden Mo, LCSW

## 2024-01-06 NOTE — [facility] Suicide Risk Assessment (Signed)
 [facility] INPATIENT:  Family/Significant Other Suicide Prevention Education  Suicide Prevention Education:  Education Completed; Rc Amison, [phone], Wife has been identified by the patient as the family member/significant other with whom the patient will be residing, and identified as the person(s) who will aid the patient in the event of a mental health crisis (suicidal ideations/suicide attempt).  With written consent from the patient, the family member/significant other has been provided the following suicide prevention education, prior to the and/or following the discharge of the patient.  The suicide prevention education provided includes the following: Suicide risk factors Suicide prevention and interventions National Suicide Hotline telephone number [facility] assessment telephone number [facility] Emergency Assistance 911 [facility] and/or Residential Mobile Crisis Unit telephone number  Request made of family/significant other to: Remove weapons (e.g., guns, rifles, knives), all items previously/currently identified as safety concern.   Remove drugs/medications (over-the-counter, prescriptions, illicit drugs), all items previously/currently identified as a safety concern.  The family member/significant other verbalizes understanding of the suicide prevention education information provided.  The family member/significant other agrees to remove the items of safety concern listed above.  Sheral Flow reports that she does not feel that the patient is a danger to others but is "unintentionally" a danger to himself when he is drinking. Wife expressed that she is also a "recovering addict" and wants the patient to prioritize his sobriety. Wife reports that patient does not have access to any weapons and is usually compliant with prescribed medications. Wife reports that she will pick the patient up at discharge. CSW team to continue to assess.    Lowry Ram 01/06/2024, 2:50 PM
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
## 2024-01-06 NOTE — Progress Notes (Signed)
 Pt calm and pleasant during assessment denying SI/HI/AVH. Pt stated he was feeling better today and is hopeful that his medications will help. Pt given education. Pt compliant with medication administration per MD orders. Pt given education, support, and encouragement to be active in his treatment plan. Pt being monitored Q 15 minutes for safety per unit protocol, remains safe on the unit

## 2024-01-06 NOTE — BHH Suicide Risk Assessment (Incomplete)
 [facility] Discharge Suicide Risk Assessment   Principal Problem: Major depressive disorder, recurrent severe without psychotic features (HCC) Discharge Diagnoses: Principal Problem:   Major depressive disorder, recurrent severe without psychotic features (HCC) Active Problems:   Alcohol use disorder, severe, dependence (HCC)   Tobacco dependence    Musculoskeletal: Strength & Muscle Tone: within normal limits Gait & Station: normal Patient leans: N/A   Psychiatric Specialty Exam:   Presentation  General Appearance:  Appropriate for Environment   Eye Contact: Fair   Speech: Clear and Coherent   Speech Volume: Normal     Mood and Affect  Mood: " Good"   Affect: Congruent; more animated   Thought Process  Thought Processes: Goal Directed   Descriptions of Associations:Intact   Orientation:Full (Time, Place and Person)   Thought Content:Improved   History of Schizophrenia/Schizoaffective disorder:No   Duration of Psychotic Symptoms:NA Hallucinations:Hallucinations: None   Ideas of Reference:None   Suicidal Thoughts:Suicidal Thoughts: Denies   Homicidal Thoughts:Homicidal Thoughts: No     Sensorium  Memory: Immediate Fair; Recent Fair; Remote Fair   Judgment: Improved   Insight: Improved     Executive Functions  Concentration: Fair   Attention Span: Fair     Fund of Knowledge: Fair   Language: Fair     Psychomotor Activity  Psychomotor Activity: Psychomotor Activity: Normal  Assets  Assets: Manufacturing systems engineer; Desire for Improvement     Sleep  Sleep: Fair    Physical Exam: Physical Exam Constitutional:      Appearance: Normal appearance.  HENT:     Head: Normocephalic and atraumatic.     Nose: No congestion.  Eyes:     Pupils: Pupils are equal, round, and reactive to light.  Cardiovascular:     Rate and Rhythm: Regular rhythm.  Pulmonary:     Effort: Pulmonary effort is normal.  Skin:    General: Skin is warm.   Neurological:     Mental Status: He is alert and oriented to person, place, and time.      Review of Systems  Constitutional:  Negative for chills and fever.  HENT:  Negative for hearing loss and sore throat.   Eyes:  Negative for blurred vision and double vision.  Respiratory:  Negative for cough and shortness of breath.   Cardiovascular:  Negative for chest pain and palpitations.  Gastrointestinal:  Negative for nausea and vomiting. .    Blood pressure 109/64, pulse 76, temperature (!) 97.3 F (36.3 C), resp. rate (!) 21, height 5\' 10"  (1.778 m), weight 107 kg, SpO2 99%. Body mass index is 33.86 kg/m.  Demographic Factors:  Male and Divorced or widowed  Loss Factors: Decrease in vocational status and Financial problems/change in socioeconomic status  Historical Factors: Impulsivity  Risk Reduction Factors:   Sense of responsibility to family, Positive therapeutic relationship, and Positive coping skills or problem solving skills  Continued Clinical Symptoms:  Alcohol/Substance Abuse/Dependencies Previous Psychiatric Diagnoses and Treatments   Cognitive Features That Contribute To Risk:  Thought constriction (tunnel vision)    Suicide Risk:  Minimal: No identifiable suicidal ideation.   Follow-up Information     Center, [facility]. Go to.   Specialty: General Practice Why: Psychiatry is May 20th, 2025 at 8:30 AM.  Your psychiatrist has put in a referral for individual therapy. This will be confirmed with you via phone. Please follow [facility] with [facility] care team. Contact information: [address] [place] [address] [phone]  Plan Of Care/Follow-up recommendations:  Per discharge summary  Lewanda Rife, MD

## 2024-01-06 NOTE — BHH Counselor (Signed)
 CSW touched base with patient's wife, Jacob Adams at [phone] to engage in safe discharge planning and conduct SPE.   Sheral Flow reports that she does not feel that the patient is a danger to others but is "unintentionally" a danger to himself when he is drinking. Wife expressed that she is also a "recovering addict" and wants the patient to prioritize his sobriety. Wife reports that patient does not have access to any weapons and is usually compliant with prescribed medications. Wife reports that she will pick the patient up at discharge. This has been communicated to team.    CSW team to continue to assess.    Reymundo Poll, MSW, LCSWA 01/06/2024 2:54 PM

## 2024-01-06 NOTE — Progress Notes (Signed)
   01/06/24 1100  Psych Admission Type (Psych Patients Only)  Admission Status Involuntary  Psychosocial Assessment  Patient Complaints None  Eye Contact Fair  Facial Expression Flat  Affect Appropriate to circumstance  Speech Logical/coherent  Interaction Assertive  Motor Activity  (WNL)  Appearance/Hygiene Unremarkable  Behavior Characteristics Cooperative  Mood Pleasant  Thought Process  Coherency WDL  Content WDL  Delusions None reported or observed  Perception WDL  Hallucination None reported or observed  Judgment WDL  Confusion None  Danger to Self  Current suicidal ideation? Denies  Agreement Not to Harm Self Yes  Description of Agreement verbal  Danger to Others  Danger to Others None reported or observed

## 2024-01-07 NOTE — Plan of Care (Addendum)
 Patient is expected to discharge today to home via car with family driving.  Patient is calm, cooperative and friendly with staff and peers. Denies SI, HI, AVH, anxiety and depression.  Will continue to monitor. 11:02 - pt escorted by mht to the exit after clothing obtained from his wife.  No other belongings were left at the hospital.  Paperwork given to the patient along with paper prescriptions.

## 2024-01-07 NOTE — Plan of Care (Signed)
   Problem: Education: Goal: Knowledge of [facility] Education information/materials will improve Outcome: Progressing Goal: Emotional status will improve Outcome: Progressing Goal: Mental status will improve Outcome: Progressing

## 2024-01-07 NOTE — Progress Notes (Signed)
  [facility] Adult Case Management Discharge Plan :  Will you be returning to the same living situation after discharge:  Yes,  Patient to return home.  At discharge, do you have transportation home?: Yes,  Patient to be picked up by his wife.  Do you have the ability to pay for your medications: Yes, [employer] [facility]   Release of information consent forms completed and in the chart;  Patient's signature needed at discharge.  Patient to Follow up at:  Follow-up Information     Center, [facility]. Go to.   Specialty: General Practice Why: Psychiatry is May 20th, 2025 at 8:30 AM.  Your psychiatrist has put in a referral for individual therapy. This will be confirmed with you via phone. Please follow up with your [facility] care team. Contact information: [address] [place] [address] [phone]                 Next level of care provider has access to [facility] Link:no  Safety Planning and Suicide Prevention discussed: Yes,  Education Completed; Tamika Nou, [phone], Wife has been identified by the patient as the family member/significant other with whom the patient will be residing, and identified as the person(s) who will aid the patient in the event of a mental health crisis (suicidal ideations/suicide attempt).  With written consent from the patient, the family member/significant other has been provided the following suicide prevention education, prior to the and/or following the discharge of the patient.     Has patient been referred to the Quitline?: Patient refused referral for treatment  Patient has been referred for addiction treatment: Yes, the patient will follow up with an outpatient provider for substance use disorder. Psychiatrist/APP: appointment made and Therapist: patient to schedule appointment  Lowry Ram, LCSW 01/07/2024, 9:25 AM
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
## 2024-01-07 NOTE — Group Note (Signed)
 Date:  01/07/2024 Time:  10:00 AM  Group Topic/Focus:  Rediscovering Joy:   The focus of this group is to explore various ways to relieve stress in a positive manner.    Participation Level:  Active  Participation Quality:  Appropriate  Affect:  Appropriate  Cognitive:  Appropriate  Insight: Appropriate  Engagement in Group:  Engaged  Modes of Intervention:  Activity  Additional Comments:    Mary Sella Marcel Sorter 01/07/2024, 10:00 AM

## 2024-01-15 ENCOUNTER — Other Ambulatory Visit: Payer: Self-pay

## 2024-01-15 ENCOUNTER — Emergency Department (HOSPITAL_COMMUNITY)
Admission: EM | Admit: 2024-01-15 | Discharge: 2024-01-15 | Disposition: A | Discharge status: Home / Self Care | Attending: Emergency Medicine | Admitting: Emergency Medicine

## 2024-01-15 ENCOUNTER — Emergency Department (HOSPITAL_COMMUNITY)

## 2024-01-15 ENCOUNTER — Inpatient Hospital Stay (HOSPITAL_COMMUNITY)
Admission: EM | Admit: 2024-01-15 | Discharge: 2024-01-18 | DRG: 683 | Disposition: A | Discharge status: Home / Self Care | Payer: MEDICAID | Attending: Family Medicine | Admitting: Family Medicine

## 2024-01-15 ENCOUNTER — Encounter (HOSPITAL_COMMUNITY): Payer: Self-pay

## 2024-01-15 DIAGNOSIS — E875 Hyperkalemia: Secondary | ICD-10-CM | POA: Diagnosis present

## 2024-01-15 DIAGNOSIS — R748 Abnormal levels of other serum enzymes: Secondary | ICD-10-CM | POA: Diagnosis not present

## 2024-01-15 DIAGNOSIS — R519 Headache, unspecified: Secondary | ICD-10-CM | POA: Diagnosis not present

## 2024-01-15 DIAGNOSIS — Y906 Blood alcohol level of 120-199 mg/100 ml: Secondary | ICD-10-CM | POA: Insufficient documentation

## 2024-01-15 DIAGNOSIS — I110 Hypertensive heart disease with heart failure: Secondary | ICD-10-CM | POA: Diagnosis present

## 2024-01-15 DIAGNOSIS — R1013 Epigastric pain: Secondary | ICD-10-CM | POA: Insufficient documentation

## 2024-01-15 DIAGNOSIS — Z87898 Personal history of other specified conditions: Secondary | ICD-10-CM

## 2024-01-15 DIAGNOSIS — E785 Hyperlipidemia, unspecified: Secondary | ICD-10-CM | POA: Diagnosis present

## 2024-01-15 DIAGNOSIS — F10120 Alcohol abuse with intoxication, uncomplicated: Secondary | ICD-10-CM | POA: Diagnosis present

## 2024-01-15 DIAGNOSIS — E119 Type 2 diabetes mellitus without complications: Secondary | ICD-10-CM | POA: Diagnosis present

## 2024-01-15 DIAGNOSIS — R079 Chest pain, unspecified: Secondary | ICD-10-CM | POA: Insufficient documentation

## 2024-01-15 DIAGNOSIS — F1010 Alcohol abuse, uncomplicated: Secondary | ICD-10-CM | POA: Diagnosis present

## 2024-01-15 DIAGNOSIS — N179 Acute kidney failure, unspecified: Principal | ICD-10-CM | POA: Diagnosis present

## 2024-01-15 DIAGNOSIS — R1010 Upper abdominal pain, unspecified: Secondary | ICD-10-CM

## 2024-01-15 DIAGNOSIS — N401 Enlarged prostate with lower urinary tract symptoms: Secondary | ICD-10-CM | POA: Diagnosis present

## 2024-01-15 DIAGNOSIS — F32A Depression, unspecified: Secondary | ICD-10-CM | POA: Diagnosis present

## 2024-01-15 DIAGNOSIS — K219 Gastro-esophageal reflux disease without esophagitis: Secondary | ICD-10-CM | POA: Diagnosis present

## 2024-01-15 DIAGNOSIS — Z7984 Long term (current) use of oral hypoglycemic drugs: Secondary | ICD-10-CM

## 2024-01-15 DIAGNOSIS — R0789 Other chest pain: Principal | ICD-10-CM

## 2024-01-15 DIAGNOSIS — Z8659 Personal history of other mental and behavioral disorders: Secondary | ICD-10-CM

## 2024-01-15 DIAGNOSIS — F419 Anxiety disorder, unspecified: Secondary | ICD-10-CM | POA: Diagnosis present

## 2024-01-15 DIAGNOSIS — Z79899 Other long term (current) drug therapy: Secondary | ICD-10-CM

## 2024-01-15 DIAGNOSIS — G8929 Other chronic pain: Secondary | ICD-10-CM | POA: Diagnosis present

## 2024-01-15 DIAGNOSIS — I5022 Chronic systolic (congestive) heart failure: Secondary | ICD-10-CM | POA: Diagnosis present

## 2024-01-15 DIAGNOSIS — E860 Dehydration: Secondary | ICD-10-CM | POA: Diagnosis present

## 2024-01-15 DIAGNOSIS — R81 Glycosuria: Secondary | ICD-10-CM | POA: Diagnosis present

## 2024-01-15 DIAGNOSIS — F10920 Alcohol use, unspecified with intoxication, uncomplicated: Secondary | ICD-10-CM

## 2024-01-15 DIAGNOSIS — I959 Hypotension, unspecified: Secondary | ICD-10-CM | POA: Insufficient documentation

## 2024-01-15 DIAGNOSIS — K2950 Unspecified chronic gastritis without bleeding: Secondary | ICD-10-CM | POA: Diagnosis present

## 2024-01-15 DIAGNOSIS — R109 Unspecified abdominal pain: Secondary | ICD-10-CM | POA: Insufficient documentation

## 2024-01-15 DIAGNOSIS — Z789 Other specified health status: Secondary | ICD-10-CM

## 2024-01-15 LAB — CBC WITH DIFFERENTIAL/PLATELET
Abs Immature Granulocytes: 0.01 10*3/uL (ref 0.00–0.07)
Basophils Absolute: 0.1 10*3/uL (ref 0.0–0.1)
Basophils Relative: 1 %
Eosinophils Absolute: 0.1 10*3/uL (ref 0.0–0.5)
Eosinophils Relative: 1 %
HCT: 39.5 % (ref 39.0–52.0)
Hemoglobin: 13 g/dL (ref 13.0–17.0)
Immature Granulocytes: 0 %
Lymphocytes Relative: 37 %
Lymphs Abs: 2.2 10*3/uL (ref 0.7–4.0)
MCH: 29.7 pg (ref 26.0–34.0)
MCHC: 32.9 g/dL (ref 30.0–36.0)
MCV: 90.2 fL (ref 80.0–100.0)
Monocytes Absolute: 0.5 10*3/uL (ref 0.1–1.0)
Monocytes Relative: 8 %
Neutro Abs: 3.2 10*3/uL (ref 1.7–7.7)
Neutrophils Relative %: 53 %
Platelets: 328 10*3/uL (ref 150–400)
RBC: 4.38 MIL/uL (ref 4.22–5.81)
RDW: 14.6 % (ref 11.5–15.5)
WBC: 6 10*3/uL (ref 4.0–10.5)
nRBC: 0 % (ref 0.0–0.2)

## 2024-01-15 LAB — CBC
HCT: 44 % (ref 39.0–52.0)
Hemoglobin: 14.6 g/dL (ref 13.0–17.0)
MCH: 30 pg (ref 26.0–34.0)
MCHC: 33.2 g/dL (ref 30.0–36.0)
MCV: 90.5 fL (ref 80.0–100.0)
Platelets: 331 10*3/uL (ref 150–400)
RBC: 4.86 MIL/uL (ref 4.22–5.81)
RDW: 14.6 % (ref 11.5–15.5)
WBC: 7.6 10*3/uL (ref 4.0–10.5)
nRBC: 0 % (ref 0.0–0.2)

## 2024-01-15 LAB — COMPREHENSIVE METABOLIC PANEL WITH GFR
ALT: 26 U/L (ref 0–44)
AST: 25 U/L (ref 15–41)
Albumin: 4.2 g/dL (ref 3.5–5.0)
Alkaline Phosphatase: 76 U/L (ref 38–126)
Anion gap: 14 (ref 5–15)
BUN: 7 mg/dL (ref 6–20)
CO2: 18 mmol/L — ABNORMAL LOW (ref 22–32)
Calcium: 8.7 mg/dL — ABNORMAL LOW (ref 8.9–10.3)
Chloride: 101 mmol/L (ref 98–111)
Creatinine, Ser: 0.87 mg/dL (ref 0.61–1.24)
GFR, Estimated: >60 mL/min (ref >60)
Glucose, Bld: 98 mg/dL (ref 70–99)
Potassium: 4 mmol/L (ref 3.5–5.1)
Sodium: 133 mmol/L — ABNORMAL LOW (ref 135–145)
Total Bilirubin: 0.4 mg/dL (ref 0.0–1.2)
Total Protein: 7.8 g/dL (ref 6.5–8.1)

## 2024-01-15 LAB — TROPONIN I (HIGH SENSITIVITY)
Troponin I (High Sensitivity): 8 ng/L (ref <18)
Troponin I (High Sensitivity): 7 ng/L (ref <18)
Troponin I (High Sensitivity): 8 ng/L (ref <18)
Troponin I (High Sensitivity): 8 ng/L (ref <18)

## 2024-01-15 LAB — BASIC METABOLIC PANEL WITH GFR
Anion gap: 16 — ABNORMAL HIGH (ref 5–15)
BUN: 14 mg/dL (ref 6–20)
CO2: 17 mmol/L — ABNORMAL LOW (ref 22–32)
Calcium: 9.3 mg/dL (ref 8.9–10.3)
Chloride: 98 mmol/L (ref 98–111)
Creatinine, Ser: 1.51 mg/dL — ABNORMAL HIGH (ref 0.61–1.24)
GFR, Estimated: 53 mL/min — ABNORMAL LOW (ref >60)
Glucose, Bld: 86 mg/dL (ref 70–99)
Potassium: 5.9 mmol/L — ABNORMAL HIGH (ref 3.5–5.1)
Sodium: 131 mmol/L — ABNORMAL LOW (ref 135–145)

## 2024-01-15 LAB — AMMONIA: Ammonia: 16 umol/L (ref 9–35)

## 2024-01-15 LAB — PROTIME-INR
INR: 1 (ref 0.8–1.2)
Prothrombin Time: 13.2 s (ref 11.4–15.2)

## 2024-01-15 LAB — LIPASE, BLOOD: Lipase: 81 U/L — ABNORMAL HIGH (ref 11–51)

## 2024-01-15 LAB — ETHANOL: Alcohol, Ethyl (B): 167 mg/dL — ABNORMAL HIGH (ref <10)

## 2024-01-15 LAB — GLUCOSE, CAPILLARY: Glucose-Capillary: 80 mg/dL (ref 70–99)

## 2024-01-15 MED ORDER — DULOXETINE HCL 60 MG PO CPEP
60.0000 mg | ORAL_CAPSULE | Freq: Every day | ORAL | Status: DC
Start: 2024-01-15 — End: 2024-01-18
  Administered 2024-01-15 – 2024-01-17 (×3): 60 mg via ORAL
  Filled 2024-01-15 (×3): qty 1

## 2024-01-15 MED ORDER — THIAMINE HCL 100 MG/ML IJ SOLN
100.0000 mg | Freq: Every day | INTRAMUSCULAR | Status: DC
  Filled 2024-01-15 (×2): qty 2

## 2024-01-15 MED ORDER — ONDANSETRON HCL 4 MG PO TABS: 4.0000 mg | ORAL_TABLET | Freq: Four times a day (QID) | ORAL | Status: DC | PRN

## 2024-01-15 MED ORDER — SPIRONOLACTONE 25 MG PO TABS: 25.0000 mg | ORAL_TABLET | Freq: Every day | ORAL | Status: DC

## 2024-01-15 MED ORDER — POLYETHYLENE GLYCOL 3350 17 G PO PACK: 17.0000 g | PACK | Freq: Every day | ORAL | Status: DC | PRN

## 2024-01-15 MED ORDER — ONDANSETRON HCL 4 MG/2ML IJ SOLN
4.0000 mg | Freq: Once | INTRAMUSCULAR | Status: AC
  Administered 2024-01-15: 4 mg via INTRAVENOUS
  Filled 2024-01-15: qty 2

## 2024-01-15 MED ORDER — FOLIC ACID 1 MG PO TABS
1.0000 mg | ORAL_TABLET | Freq: Every day | ORAL | Status: DC
  Administered 2024-01-15 – 2024-01-18 (×4): 1 mg via ORAL
  Filled 2024-01-15 (×4): qty 1

## 2024-01-15 MED ORDER — ATORVASTATIN CALCIUM 80 MG PO TABS
80.0000 mg | ORAL_TABLET | Freq: Every day | ORAL | Status: DC
  Administered 2024-01-16 – 2024-01-18 (×3): 80 mg via ORAL
  Filled 2024-01-15 (×3): qty 1

## 2024-01-15 MED ORDER — ALBUTEROL SULFATE (2.5 MG/3ML) 0.083% IN NEBU: 3.0000 mL | INHALATION_SOLUTION | Freq: Four times a day (QID) | RESPIRATORY_TRACT | Status: DC | PRN

## 2024-01-15 MED ORDER — ALUM & MAG HYDROXIDE-SIMETH 200-200-20 MG/5ML PO SUSP
30.0000 mL | Freq: Once | ORAL | Status: AC
  Administered 2024-01-15: 30 mL via ORAL
  Filled 2024-01-15: qty 30

## 2024-01-15 MED ORDER — SODIUM CHLORIDE 0.9 % IV BOLUS
1000.0000 mL | Freq: Once | INTRAVENOUS | Status: AC
  Administered 2024-01-15: 1000 mL via INTRAVENOUS

## 2024-01-15 MED ORDER — THIAMINE MONONITRATE 100 MG PO TABS
100.0000 mg | ORAL_TABLET | Freq: Every day | ORAL | Status: DC
  Administered 2024-01-15 – 2024-01-18 (×4): 100 mg via ORAL
  Filled 2024-01-15 (×4): qty 1

## 2024-01-15 MED ORDER — QUETIAPINE FUMARATE 100 MG PO TABS
900.0000 mg | ORAL_TABLET | Freq: Every day | ORAL | Status: DC
  Administered 2024-01-15 – 2024-01-16 (×2): 900 mg via ORAL
  Filled 2024-01-15 (×2): qty 9

## 2024-01-15 MED ORDER — ALBUTEROL SULFATE (2.5 MG/3ML) 0.083% IN NEBU
10.0000 mg | INHALATION_SOLUTION | Freq: Once | RESPIRATORY_TRACT | Status: AC
  Administered 2024-01-15: 10 mg via RESPIRATORY_TRACT
  Filled 2024-01-15: qty 12

## 2024-01-15 MED ORDER — SACUBITRIL-VALSARTAN 49-51 MG PO TABS
1.0000 | ORAL_TABLET | Freq: Two times a day (BID) | ORAL | Status: DC
Start: 2024-01-15 — End: 2024-01-16
  Administered 2024-01-15: 1 via ORAL
  Filled 2024-01-15 (×3): qty 1

## 2024-01-15 MED ORDER — CARVEDILOL 6.25 MG PO TABS
6.2500 mg | ORAL_TABLET | Freq: Two times a day (BID) | ORAL | Status: DC
Start: 2024-01-15 — End: 2024-01-17
  Administered 2024-01-15 – 2024-01-16 (×2): 6.25 mg via ORAL
  Filled 2024-01-15 (×2): qty 1

## 2024-01-15 MED ORDER — DULOXETINE HCL 30 MG PO CPEP
30.0000 mg | ORAL_CAPSULE | Freq: Every morning | ORAL | Status: DC
  Administered 2024-01-16 – 2024-01-18 (×3): 30 mg via ORAL
  Filled 2024-01-15 (×4): qty 1

## 2024-01-15 MED ORDER — FAMOTIDINE 20 MG PO TABS
40.0000 mg | ORAL_TABLET | Freq: Once | ORAL | Status: AC
Start: 2024-01-15 — End: 2024-01-15
  Administered 2024-01-15: 40 mg via ORAL
  Filled 2024-01-15: qty 2

## 2024-01-15 MED ORDER — HYOSCYAMINE SULFATE 0.125 MG SL SUBL
0.2500 mg | SUBLINGUAL_TABLET | Freq: Once | SUBLINGUAL | Status: AC
  Administered 2024-01-15: 0.25 mg via SUBLINGUAL
  Filled 2024-01-15: qty 2

## 2024-01-15 MED ORDER — ADULT MULTIVITAMIN W/MINERALS CH
1.0000 | ORAL_TABLET | Freq: Every day | ORAL | Status: DC
  Administered 2024-01-15 – 2024-01-18 (×4): 1 via ORAL
  Filled 2024-01-15 (×4): qty 1

## 2024-01-15 MED ORDER — ENOXAPARIN SODIUM 40 MG/0.4ML IJ SOSY
40.0000 mg | PREFILLED_SYRINGE | INTRAMUSCULAR | Status: DC
  Administered 2024-01-15 – 2024-01-17 (×3): 40 mg via SUBCUTANEOUS
  Filled 2024-01-15 (×3): qty 0.4

## 2024-01-15 MED ORDER — SODIUM ZIRCONIUM CYCLOSILICATE 10 G PO PACK
10.0000 g | PACK | Freq: Every day | ORAL | Status: DC
  Administered 2024-01-15 – 2024-01-16 (×2): 10 g via ORAL
  Filled 2024-01-15 (×2): qty 1

## 2024-01-15 MED ORDER — OXYCODONE-ACETAMINOPHEN 5-325 MG PO TABS
1.0000 | ORAL_TABLET | Freq: Once | ORAL | Status: AC
  Administered 2024-01-15: 1 via ORAL
  Filled 2024-01-15: qty 1

## 2024-01-15 MED ORDER — ACETAMINOPHEN 650 MG RE SUPP: 650.0000 mg | Freq: Four times a day (QID) | RECTAL | Status: DC | PRN

## 2024-01-15 MED ORDER — SODIUM CHLORIDE 0.9 % IV BOLUS
500.0000 mL | Freq: Once | INTRAVENOUS | Status: AC
  Administered 2024-01-15: 500 mL via INTRAVENOUS

## 2024-01-15 MED ORDER — ONDANSETRON HCL 4 MG/2ML IJ SOLN
4.0000 mg | Freq: Four times a day (QID) | INTRAMUSCULAR | Status: DC | PRN
  Administered 2024-01-15: 4 mg via INTRAVENOUS
  Filled 2024-01-15: qty 2

## 2024-01-15 MED ORDER — PANTOPRAZOLE SODIUM 40 MG PO TBEC
40.0000 mg | DELAYED_RELEASE_TABLET | Freq: Every day | ORAL | Status: DC
  Administered 2024-01-16 – 2024-01-18 (×3): 40 mg via ORAL
  Filled 2024-01-15 (×3): qty 1

## 2024-01-15 MED ORDER — ACETAMINOPHEN 325 MG PO TABS
650.0000 mg | ORAL_TABLET | Freq: Four times a day (QID) | ORAL | Status: DC | PRN
  Administered 2024-01-15: 650 mg via ORAL
  Filled 2024-01-15: qty 2

## 2024-01-15 MED ORDER — DAPAGLIFLOZIN PROPANEDIOL 10 MG PO TABS
10.0000 mg | ORAL_TABLET | Freq: Every day | ORAL | Status: DC
  Filled 2024-01-15: qty 1

## 2024-01-15 NOTE — Assessment & Plan Note (Addendum)
 Cr 1.51 on presentation, up from 0.87 just 24h prior. Likely prerenal in the setting of dehydration/poor PO intake due to abdominal pain and decreased appetite. S/p IVF bolus in the ED. - Admit to FMTS, attending Dr. Jennette Kettle - med surg, Vital signs per floor - Give additional 1L IVF bolus - AM CBC, BMP  - avoid NSAIDs

## 2024-01-15 NOTE — ED Notes (Signed)
 Pt called family to pick him up to ED, pt A&Ox4, ambulatory without any assistance, maintaining steady and equal gait out of ED.

## 2024-01-15 NOTE — Plan of Care (Signed)
  Problem: Education: Goal: Knowledge of General Education information will improve Description: Including pain rating scale, medication(s)/side effects and non-pharmacologic comfort measures Outcome: Progressing   Problem: Clinical Measurements: Goal: Ability to maintain clinical measurements within normal limits will improve Outcome: Progressing Goal: Will remain free from infection Outcome: Progressing Goal: Diagnostic test results will improve Outcome: Progressing Goal: Respiratory complications will improve Outcome: Progressing Goal: Cardiovascular complication will be avoided Outcome: Progressing   Problem: Activity: Goal: Risk for activity intolerance will decrease Outcome: Progressing   Problem: Nutrition: Goal: Adequate nutrition will be maintained Outcome: Progressing   Problem: Elimination: Goal: Will not experience complications related to bowel motility Outcome: Progressing Goal: Will not experience complications related to urinary retention Outcome: Progressing   Problem: Pain Managment: Goal: General experience of comfort will improve and/or be controlled Outcome: Progressing   Problem: Safety: Goal: Ability to remain free from injury will improve Outcome: Progressing   Problem: Skin Integrity: Goal: Risk for impaired skin integrity will decrease Outcome: Progressing

## 2024-01-15 NOTE — Assessment & Plan Note (Addendum)
 Per chart review pt was hospitalized for this in 2020. He drinks 3-4 40oz beers daily, and sometimes also drinks a fifth of liquor on top of this. Last drink yesterday PM. Currently VSS, pt A&Ox4, not altered. - CIWAs without ativan - cardiac monitoring x12 hours - low threshold to initiate ativan if indicated by CIWA scores - thiamine, folic acid, MVI

## 2024-01-15 NOTE — Assessment & Plan Note (Addendum)
 Anxiety/Depression: continue home duloxetine 30 mg in AM, 60 mg in PM Bipolar 1: continue home seroquel 900 mg at bedtime HTN: continue home coreg 6.25 mg BID, entresto 49-51 mg BID, spironolactone 25 mg daily T2DM: continue home farxiga 10 mg daily, holding home metformin HLD: continue home atorvastatin 80 mg

## 2024-01-15 NOTE — Hospital Course (Addendum)
 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  Acute on chronic epigastric pain 2/2 suspected gastritis Chronic issue, likely 2/2 alcohol use and gastritis.  Patient presented with 2 days of vomiting and epigastric pain, incidentally noted to have AKI and hyperkalemia as below.  Chest/Epigastric pain workup including EKG and troponin were unremarkable.  Pain self resolved with supportive care.  At time of discharge, patient was tolerating PO, and chest/epigastric pain resolved.  Patient already had outpatient follow-up with GI for EGD and colonoscopy planned around the week of April 2nd.  AKI  Hyperkalemia  Initial creatinine of 1.5, increased to 2.5 next morning.  Hyperkalemia present on admission, but resolved with Lokelma.  Ultrasound renal showed normal anatomy.  FENa supporting prerenal etiology of AKI.  UA showed glucosuria.  IVF started.  TTE obtained and showed improved EF 40-45% relative to prior in 2020.  Creatinine started trending down 3/30 and had decreased to 1.2, near baseline, at time of discharge.  Patient tolerating PO fluids well and discharged in stable state.  Alcohol use disorder CIWAs monitored, remained consistently low, at 0 for >24 hours at time of discharge.  Did not require benzodiazepines this admission.  Patient interested in alcohol cessation, Naltrexone PO continued at discharge.  Other chronic conditions were medically managed with home medications and formulary alternatives as necessary (anxiety, depression, bipolar 1, HTN, T2DM, HLD).  Follow-up recommendations: Paused Farxiga for glycosuria and given AKI.  Paused Entresto and spironolactone given AKI.  Please repeat BMP and consider restarting meds if appropriate. Stopped oxycodone and flexeril at discharge, not clear why patient on these  meds. Note that patient's quetiapine decreased to 200 mg nightly from 900 mg during recent psych hospitalization 3/17.  Continued on 200 mg dose at discharge. Follow up on alcohol cessation.  Naltrexone PO continued at discharge.

## 2024-01-15 NOTE — Assessment & Plan Note (Addendum)
 Has been a chronic problem; pt relates it to his drinking. Does have history of gastritis. Suspect possible component of GERD contributing to this and chest pain as above. Has had decreased appetite lately, with nausea and vomiting. - tylenol q6 PRN - zofran 4 mg q6 PRN - continue home protonix 40 mg daily - F/u outpt w/ GI for EGD/colonoscopy

## 2024-01-15 NOTE — Assessment & Plan Note (Addendum)
 K 5.9 on presentation. S/p x1 lokelma 10 mg in the ED. Will receive additional 1 L fluid bolus as above. - repeat BMP to trend K - AM BMP

## 2024-01-15 NOTE — ED Provider Notes (Signed)
 Emergency Department Provider Note   I have reviewed the triage vital signs and the nursing notes.   HISTORY  Chief Complaint Chest Pain   HPI Jacob Adams is a 58 y.o. male with PMH of EtOH use, GERD, prior GI bleeding presents to the emergency department with burning chest and abdominal pain.  He was seen in the ED last night for this with negative workup including delta troponin.  He states since returning home his burning pain is continued and he has had some associated nausea with vomiting.  No hematemesis.  No shortness of breath.  Denies fevers.  He also notes feeling lightheaded.  His last alcohol was yesterday evening. No black, tarry stool.    Past Medical History:  Diagnosis Date   Acid reflux    Alcohol withdrawal (HCC) 01/2019   Anxiety    Arthritis    "toes" (07/26/2014)   Depression    Headache(784.0)    "weekly" (07/26/2014)   History of blood transfusion ~ 2000   "related to nose bleeding"   History of stomach ulcers    Hypertension    Lower GI bleeding admitted 07/26/2014   Mental disorder    Migraine    "@ least monthly" (07/26/2014)   Pancreatitis    Rectal bleeding 07/26/2014   Sleep apnea    "haven't been RX'd mask yet" (07/26/2014)    Review of Systems  Constitutional: No fever/chills Cardiovascular: Positive chest pain. Respiratory: Denies shortness of breath. Gastrointestinal: No abdominal pain. Positive nausea/vomiting.  Skin: Negative for rash. Neurological: Negative for headaches, focal weakness or numbness.  ____________________________________________   PHYSICAL EXAM:  VITAL SIGNS: ED Triage Vitals  Encounter Vitals Group     BP 01/15/24 1646 (!) 140/98     Pulse Rate 01/15/24 1646 93     Resp 01/15/24 1646 16     Temp 01/15/24 1646 98.5 F (36.9 C)     Temp Source 01/15/24 1646 Oral     SpO2 01/15/24 1646 100 %     Weight 01/15/24 1644 230 lb (104.3 kg)     Height 01/15/24 1644 5\' 10"  (1.778 m)   Constitutional: Alert and  oriented. Well appearing and in no acute distress. Eyes: Conjunctivae are normal.  Head: Atraumatic. Nose: No congestion/rhinnorhea. Mouth/Throat: Mucous membranes are moist.  Neck: No stridor.  Cardiovascular: Normal rate, regular rhythm. Good peripheral circulation. Grossly normal heart sounds.   Respiratory: Normal respiratory effort.  No retractions. Lungs CTAB. Gastrointestinal: Soft and nontender. No distention.  Musculoskeletal: No lower extremity tenderness nor edema. No gross deformities of extremities. Neurologic:  Normal speech and language. No gross focal neurologic deficits are appreciated.  Skin:  Skin is warm, dry and intact. No rash noted.   ____________________________________________   LABS (all labs ordered are listed, but only abnormal results are displayed)  Labs Reviewed  BASIC METABOLIC PANEL WITH GFR - Abnormal; Notable for the following components:      Result Value   Sodium 131 (*)    Potassium 5.9 (*)    CO2 17 (*)    Creatinine, Ser 1.51 (*)    GFR, Estimated 53 (*)    Anion gap 16 (*)    All other components within normal limits  BASIC METABOLIC PANEL WITH GFR - Abnormal; Notable for the following components:   Sodium 129 (*)    Chloride 97 (*)    CO2 19 (*)    Creatinine, Ser 2.50 (*)    Calcium 8.3 (*)  GFR, Estimated 29 (*)    All other components within normal limits  CBC - Abnormal; Notable for the following components:   RBC 3.85 (*)    Hemoglobin 11.8 (*)    HCT 34.3 (*)    All other components within normal limits  URINALYSIS, ROUTINE W REFLEX MICROSCOPIC - Abnormal; Notable for the following components:   Glucose, UA >=500 (*)    All other components within normal limits  BASIC METABOLIC PANEL WITH GFR - Abnormal; Notable for the following components:   Sodium 132 (*)    Glucose, Bld 106 (*)    Creatinine, Ser 2.72 (*)    Calcium 8.6 (*)    GFR, Estimated 26 (*)    All other components within normal limits  BASIC METABOLIC  PANEL WITH GFR - Abnormal; Notable for the following components:   Glucose, Bld 126 (*)    Creatinine, Ser 1.90 (*)    Calcium 8.8 (*)    GFR, Estimated 40 (*)    All other components within normal limits  CBC - Abnormal; Notable for the following components:   RBC 3.91 (*)    Hemoglobin 11.7 (*)    HCT 35.5 (*)    All other components within normal limits  CBC - Abnormal; Notable for the following components:   RBC 3.57 (*)    Hemoglobin 10.8 (*)    HCT 32.0 (*)    All other components within normal limits  COMPREHENSIVE METABOLIC PANEL WITH GFR - Abnormal; Notable for the following components:   Sodium 131 (*)    Glucose, Bld 121 (*)    BUN 23 (*)    Creatinine, Ser 2.36 (*)    Calcium 8.0 (*)    Total Protein 5.7 (*)    Albumin 3.0 (*)    GFR, Estimated 31 (*)    Anion gap 4 (*)    All other components within normal limits  URINALYSIS, ROUTINE W REFLEX MICROSCOPIC - Abnormal; Notable for the following components:   Color, Urine STRAW (*)    Specific Gravity, Urine 1.001 (*)    Glucose, UA >=500 (*)    Bacteria, UA RARE (*)    All other components within normal limits  GLUCOSE, CAPILLARY - Abnormal; Notable for the following components:   Glucose-Capillary 112 (*)    All other components within normal limits  BASIC METABOLIC PANEL WITH GFR - Abnormal; Notable for the following components:   Calcium 8.8 (*)    All other components within normal limits  GLUCOSE, CAPILLARY - Abnormal; Notable for the following components:   Glucose-Capillary 194 (*)    All other components within normal limits  CBC  GLUCOSE, CAPILLARY  SODIUM, URINE, RANDOM  CREATININE, URINE, RANDOM  ACTH  CORTISOL  TROPONIN I (HIGH SENSITIVITY)  TROPONIN I (HIGH SENSITIVITY)  TROPONIN I (HIGH SENSITIVITY)   ________________  RADIOLOGY  None  ____________________________________________   PROCEDURES  Procedure(s) performed:   Procedures  None   ____________________________________________   INITIAL IMPRESSION / ASSESSMENT AND PLAN / ED COURSE  Pertinent labs & imaging results that were available during my care of the patient were reviewed by me and considered in my medical decision making (see chart for details).   This patient is Presenting for Evaluation of CP, which does require a range of treatment options, and is a complaint that involves a high risk of morbidity and mortality.  The Differential Diagnoses includes but is not exclusive to acute coronary syndrome, aortic dissection, pulmonary embolism, cardiac tamponade,  community-acquired pneumonia, pericarditis, musculoskeletal chest wall pain, etc.   Critical Interventions-    Medications  atorvastatin (LIPITOR) tablet 80 mg (80 mg Oral Given 01/18/24 1000)  DULoxetine (CYMBALTA) DR capsule 30 mg (30 mg Oral Given 01/18/24 0604)    And  DULoxetine (CYMBALTA) DR capsule 60 mg (60 mg Oral Given 01/17/24 2138)  pantoprazole (PROTONIX) EC tablet 40 mg (40 mg Oral Given 01/18/24 1000)  albuterol (PROVENTIL) (2.5 MG/3ML) 0.083% nebulizer solution 3 mL (has no administration in time range)  enoxaparin (LOVENOX) injection 40 mg (40 mg Subcutaneous Given 01/17/24 2138)  thiamine (VITAMIN B1) tablet 100 mg (100 mg Oral Given 01/18/24 1000)    Or  thiamine (VITAMIN B1) injection 100 mg ( Intravenous See Alternative 01/18/24 1000)  folic acid (FOLVITE) tablet 1 mg (1 mg Oral Given 01/18/24 1000)  multivitamin with minerals tablet 1 tablet (1 tablet Oral Given 01/18/24 1000)  polyethylene glycol (MIRALAX / GLYCOLAX) packet 17 g (has no administration in time range)  acetaminophen (TYLENOL) tablet 1,000 mg (1,000 mg Oral Given 01/16/24 2120)    Or  acetaminophen (TYLENOL) suppository 650 mg ( Rectal See Alternative 01/16/24 2120)  QUEtiapine (SEROQUEL XR) 24 hr tablet 200 mg (200 mg Oral Given 01/17/24 2139)  perflutren lipid microspheres (DEFINITY) IV suspension (4 mLs Intravenous Given  01/17/24 1711)  alum & mag hydroxide-simeth (MAALOX/MYLANTA) 200-200-20 MG/5ML suspension 30 mL (30 mLs Oral Given 01/15/24 1752)  sodium chloride 0.9 % bolus 500 mL (0 mLs Intravenous Stopped 01/15/24 1942)  ondansetron (ZOFRAN) injection 4 mg (4 mg Intravenous Given 01/15/24 1752)  albuterol (PROVENTIL) (2.5 MG/3ML) 0.083% nebulizer solution 10 mg (10 mg Nebulization Given 01/15/24 1820)  sodium chloride 0.9 % bolus 1,000 mL (0 mLs Intravenous Stopped 01/15/24 2300)  lactated ringers bolus 500 mL (500 mLs Intravenous New Bag/Given 01/16/24 1743)  lactated ringers bolus 500 mL (500 mLs Intravenous New Bag/Given 01/16/24 2131)  prochlorperazine (COMPAZINE) injection 10 mg (10 mg Intravenous Given 01/16/24 2118)  lactated ringers bolus 1,000 mL (1,000 mLs Intravenous New Bag/Given 01/16/24 2258)  naloxone [facility]) injection 0.4 mg (0.4 mg Intravenous Given 01/17/24 0134)    Reassessment after intervention:  CP resolved.    I decided to review pertinent External Data, and in summary patient with full evaluation yesterday.   Clinical Laboratory Tests Ordered, included CBC with leukocytosis. No anemia. Troponin negative. Patient with new AKI compared to early this AM.   Cardiac Monitor Tracing which shows NSR.    Social Determinants of Health Risk patient is a smoker.    Medical Decision Making: Summary:  Patient presents emergency department burning pain in his chest and abdomen.  No fever.  Diffusely soft, nontender exam.  Plan for repeat blood work, Maalox, Zofran, gentle IV fluids.  Clinically suspect gastritis or esophagitis secondary to EtOH but we will repeat blood work.  Reevaluation with update and discussion with patient. AKI noted on labs with hyperkalemia. Plan for admit.    Patient's presentation is most consistent with acute presentation with potential threat to life or bodily function.   Disposition: admit  ____________________________________________  FINAL CLINICAL  IMPRESSION(S) / ED DIAGNOSES  Final diagnoses:  Atypical chest pain  Hyperkalemia  AKI (acute kidney injury) (HCC)     NEW OUTPATIENT MEDICATIONS STARTED DURING THIS VISIT:  Current Discharge Medication List     START taking these medications   Details  folic acid (FOLVITE) 1 MG tablet Take 1 tablet (1 mg total) by mouth daily. Qty: 30 tablet, Refills: 0  Multiple Vitamin (MULTIVITAMIN WITH MINERALS) TABS tablet Take 1 tablet by mouth daily. Qty: 30 tablet, Refills: 0    polyethylene glycol powder (GLYCOLAX/MIRALAX) 17 GM/SCOOP powder Take 17 g by mouth daily as needed for mild constipation. Qty: 238 g, Refills: 0    thiamine (VITAMIN B1) 100 MG tablet Take 1 tablet (100 mg total) by mouth daily. Qty: 30 tablet, Refills: 0        Note:  This document was prepared using Dragon voice recognition software and may include unintentional dictation errors.  Alona Bene, MD, [facility]    Caliope Ruppert, Arlyss Repress, MD 01/18/24 [phone]

## 2024-01-15 NOTE — ED Triage Notes (Signed)
 Pt BIB [facility] from home. He has a hx of ETOH abuse, CHF, Cirrhosis and last night he started having chest pressure. He was seen at [facility] and determined it was non-cardiac CP and he was discharge. Now he wanted recheck because nothing has changed. Last ETOH was yesterday afternoon.   EMS Vitals  150/90 HR 84 SpO2 99% on RA CBG 99

## 2024-01-15 NOTE — ED Notes (Signed)
 Spouse would like an update [phone]

## 2024-01-15 NOTE — ED Notes (Signed)
 [place] PIV placed.

## 2024-01-15 NOTE — TOC Initial Note (Signed)
 Transition of Care [facility]) - Initial/Assessment Note    Patient Details  Name: Jacob Adams MRN: [id number] Date of Birth: 1966/10/06  Transition of Care [facility]) CM/SW Contact:    Lucretia Field, LCSW Phone Number: 01/15/2024, 7:38 PM  Clinical Narrative:                 Patient is a 58 y/o male, he lives with his family including children. Patient has a history of ETOH abuse, CHF, and Cirrhosis. Also patient has a diagnoses of depression, anxiety, and bipolar. Patient was sober from 2023 until yesterday he relapsed due to personal stressors he stated. Patient is a veteran on disability. Patient stated he is not having any suicidal ideation, he has a therapist and psychiatrist.         Patient Goals and CMS Choice To get back being sober, working on his mental health and coping with his stressors.           Expected Discharge Plan and Services Return home.                                             Prior Living Arrangements/Services Living with his children.                      Activities of Daily Living   ADL Screening (condition at time of admission) Independently performs ADLs?: Yes (appropriate for developmental age) Is the patient deaf or have difficulty hearing?: No Does the patient have difficulty seeing, even when wearing glasses/contacts?: No Does the patient have difficulty concentrating, remembering, or making decisions?: No  Permission Sought/Granted                  Emotional Assessment Patient spoke softly, patient expressed how upset he is with himself relapsing on alcohol and he wants to be better and get better.             Admission diagnosis:  cp; etoh Patient Active Problem List   Diagnosis Date Noted   Chronic health problem 01/15/2024   Major depressive disorder, 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   Major depressive disorder, recurrent, severe without psychotic behavior (HCC) 01/02/2024   Low  back pain 12/31/2023   Lactic acid acidosis 12/31/2023   Low glucose level 12/31/2023   Suicidal behavior 12/31/2023   Chronic colitis 04/25/2022   Alcoholic cirrhosis of liver without ascites (HCC) 04/25/2022   Portal hypertensive gastropathy (HCC) 04/25/2022   History of anemia 04/25/2022   Hyponatremia 01/23/2022   High anion gap metabolic acidosis 01/23/2022   Hyperkalemia 01/23/2022   Fatty liver 01/23/2022   Insomnia 10/04/2020   AMS (altered mental status) 07/23/2020   Sleep apnea    Hypertension    Acute metabolic encephalopathy 01/24/2020   Normocytic anemia 08/15/2019   Hypothyroidism 08/15/2019   HLD (hyperlipidemia) 08/15/2019   Anxiety and depression 08/15/2019   Chronic systolic CHF (congestive heart failure) (HCC) 02/08/2019   Tobacco dependence 02/08/2019   Alcohol withdrawal (HCC) 01/2019   HTN (hypertension) 12/24/2018   Alcohol abuse 01/06/2016   MDD (major depressive disorder), recurrent, severe, 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/03/2015   Alcohol use disorder, severe, dependence (HCC) 11/03/2015   History of GI bleed 10/31/2015   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)    PCP:  [facility]:   [facility] [place], [place] - [address] [address] Phone: [phone] Fax: [phone]  [facility] [address], [place] - [address] [address] Phone: [phone] Fax: [phone]  [facility] - [place], [place] - [address] [place] [address] Phone: [phone] Fax: [phone]     Social Drivers of Health (SDOH) Social History: SDOH Screenings   Food Insecurity: Food Insecurity Present (01/15/2024)  Housing: Low Risk  (01/15/2024)  Transportation Needs: No Transportation Needs (01/15/2024)  Recent Concern: Transportation  Needs - Unmet Transportation Needs (12/31/2023)  Utilities: Not At Risk (01/15/2024)  Alcohol Screen: Low Risk  (01/04/2024)  Depression (PHQ2-9): Medium Risk (06/29/2020)  Financial Resource Strain: Not on File (10/03/2022)   Received from [place], [place]  Physical Activity: Not on File (10/03/2022)   Received from [place], [place]  Social Connections: Moderately Isolated (01/04/2024)  Stress: Not on File (10/03/2022)   Received from [place], [place]  Tobacco Use: High Risk (01/15/2024)   SDOH Interventions:     Readmission Risk Interventions    01/01/2024    2:34 PM  Readmission Risk Prevention Plan  Post Dischage Appt Complete  Medication Screening Complete  Transportation Screening Complete   Lily Peer, MSW, LCSWA Transition of Care  Clinical Social Worker (ED 3-11 Mon-Fri)  [phone]

## 2024-01-15 NOTE — ED Triage Notes (Signed)
 Pt BIBA from home, c/o chest and abd pain since a hour ago. Right sided chest pain. Hx of CHF cirrhoisis. Has been drinking tonight.   BP 138/104

## 2024-01-15 NOTE — H&P (Addendum)
 Hospital Admission History and Physical Service Pager: [phone]  Patient name: Jacob Adams Medical record number: [id number] Date of Birth: 29-Mar-1966 Age: 58 y.o. Gender: male  Primary Care Provider: [facility] Consultants: none Code Status: FULL which was confirmed with family if patient unable to confirm   Preferred Emergency Contact:  Contact Information     Name Relation Home Work Mobile   Jacob Adams Spouse [phone]  [phone]   Jacob Adams Sister [phone]  [phone]      Other Contacts   None on File      Chief Complaint: chest/abdominal pain  Assessment and Plan: Jacob Adams is a 58 y.o. male presenting with chest and abdominal pain, admitted for AKI and hyperkalemia. Differential for chest/abm pain includes MI, PE, GERD, gastritis, pancreatitis.   - Most suspicious for mixed GERD/gastritis picture given pt history and relatively benign workup. This could certainly explain his discomfort as well as decreased appetite. It also appears that pt has had multiple presentations previously for similar chest/abm pain and vomiting and will have EGD/colonoscopy w. GI outpt for further workup.  - possible pancreatitis given pt alcohol history, however description of pain is not consistent with this. - Very low suspicion for CV pathology or PE as pt workup has been grossly negative, chest pain reproducible on palpation.  Assessment & Plan AKI (acute kidney injury) (HCC) Cr 1.51 on presentation, up from 0.87 just 24h prior. Likely prerenal in the setting of dehydration/poor PO intake due to abdominal pain and decreased appetite. S/p IVF bolus in the ED. - Admit to FMTS, attending Dr. Jennette Kettle - med surg, Vital signs per floor - Give additional 1L IVF bolus - AM CBC, BMP  - avoid NSAIDs Hyperkalemia K 5.9 on presentation. S/p x1 lokelma 10 mg in the ED. Will receive additional 1 L fluid bolus as above. - repeat BMP to trend K - AM  BMP Chest pain Atypical in nature and reproducible on exam. Suspect musculoskeletal in nature. Troponins negative, EKG without evidence of ST elevation. CXR negative. - cardiac monitoring x12 hours - tylenol q6 PRN Abdominal pain Has been a chronic problem; pt relates it to his drinking. Does have history of gastritis. Suspect possible component of GERD contributing to this and chest pain as above. Has had decreased appetite lately, with nausea and vomiting. - tylenol q6 PRN - zofran 4 mg q6 PRN - continue home protonix 40 mg daily - F/u outpt w/ GI for EGD/colonoscopy History of seizure due to alcohol withdrawal Per chart review pt was hospitalized for this in 2020. He drinks 3-4 40oz beers daily, and sometimes also drinks a fifth of liquor on top of this. Last drink yesterday PM. Currently VSS, pt A&Ox4, not altered. - CIWAs without ativan - cardiac monitoring x12 hours - low threshold to initiate ativan if indicated by CIWA scores - thiamine, folic acid, MVI Chronic health problem Anxiety/Depression: continue home duloxetine 30 mg in AM, 60 mg in PM Bipolar 1: continue home seroquel 900 mg at bedtime HTN: continue home coreg 6.25 mg BID, entresto 49-51 mg BID, spironolactone 25 mg daily T2DM: continue home farxiga 10 mg daily, holding home metformin HLD: continue home atorvastatin 80 mg   FEN/GI: Carb modified diet VTE Prophylaxis: lovenox - discussed w/ pt regarding note in chart that he does not eat pork; he is ok with receiving this medication and understands it's purpose in his care.  Disposition: med surg  History of Present Illness:  Jacob Adams  Jacob Adams is a 58 y.o. male presenting with chest and abdominal pain that started several days ago, prompting him to present to the ED last night. He was ultimately worked up and discharged home in stable condition, but symptoms worsened and so he presented to the ED again today. He reports central chest pain/pressure, shortness of breath,  constant abdominal pain that is occasionally "shooting" when he changes positions. He has also had decreased appetite over last 2-3 days with nausea and vomiting. Vomited 3 times so far today. Did have URI symptoms 4 weeks ago, but none recently.   He reports his last drink was last night (3/27). He drinks 3-4 40oz beers daily, sometimes with an additional fifth of liquor.   In the ED, labs showed elevated Cr 1.51 and hyperkalemia. CT head, CXR, EKG unremarkable. Patient was given IVF bolus and 10 mg lokelma. Called for admission due to AKI and hyperkalemia.  Review Of Systems: Per HPI.  Pertinent Past Medical History: Alcohol withdrawal Anxiety/depression HTN  Pancreatitis OSA Migraines Remainder reviewed in history tab.   Pertinent Past Surgical History: Knee replacement  Remainder reviewed in history tab.   Pertinent Social History: Tobacco use: Yes - many years Alcohol use: 3-4 40oz beers daily, sometimes with additional fifth of liquor Other Substance use: none Lives with several family members  Pertinent Family History: Mother - colon cancer, CAD Father - alcoholism Remainder reviewed in history tab.   Important Outpatient Medications: Atorvastatin 80 mg Coreg 6.25 mg Duloxetine 30 mg in the AM, 60 mg in the PM Farxiga 10 mg daily Metformin 500 mg daily Protonix 40 mg daily Seroquel 900 mg at bedtime Entresto 49-51 mg BID Spironolactone 25 mg daily Remainder reviewed in medication history.   Objective: BP (!) 140/98 (BP Location: Left Arm)   Pulse 93   Temp 98.5 F (36.9 C) (Oral)   Resp 16   Ht 5\' 10"  (1.778 m)   Wt 104.3 kg   SpO2 100%   BMI 33.00 kg/m  Exam: General: well-appearing, no acute distress. HEENT: normocephalic, PERRLA, EOM grossly intact, MMM. Cardio: Regular rate, regular rhythm, no murmurs on exam. Central chest pain reproducible on palpation. Pulm: Clear, no wheezing, no crackles. No increased work of breathing on room  air. Abdominal: bowel sounds present, soft, non-tender, non-distended. Extremities: no peripheral edema. Moves all extremities equally. Neuro: Alert and oriented x3, speech normal in content, no facial asymmetry. Psych:  Cognition and judgment appear intact. Alert, communicative, and cooperative with normal attention span and concentration. No apparent delusions, illusions, hallucinations.   Labs:  CBC BMET  Recent Labs  Lab 01/15/24 1648  WBC 7.6  HGB 14.6  HCT 44.0  PLT 331   Recent Labs  Lab 01/15/24 1648  NA 131*  K 5.9*  CL 98  CO2 17*  BUN 14  CREATININE 1.51*  GLUCOSE 86  CALCIUM 9.3     Troponins 8 > 8   3/28 EKG: Regular rate and rhythm, P waves present, no widening of the QRS. No prolongation of Qtc interval.  Left axis deviation. No evidence of ST elevation.  3/28 CXR: IMPRESSION: No acute abnormality noted.  3/28 CT head: IMPRESSION: Mild atrophic changes without acute abnormality.  Cyndia Skeeters, DO 01/15/2024, 7:27 PM PGY-1, [facility]  FPTS Intern pager: [phone], text pages welcome Secure chat group [facility] Teaching Service

## 2024-01-15 NOTE — ED Notes (Signed)
 Patient transported to CT

## 2024-01-15 NOTE — ED Provider Notes (Signed)
 [place] EMERGENCY DEPARTMENT AT [facility] Provider Note   CSN: [id number] Arrival date & time: 01/15/24  0015     History  Chief Complaint  Patient presents with   Chest Pain   Abdominal Pain   Alcohol Intoxication    Jacob Adams is a 58 y.o. male.  Presents to the emergency department with multiple complaints.  Patient reports that he is experiencing chest pain that started a couple of hours ago.  Pain is in the center of his chest.  Pain does not radiate.  No nausea or diaphoresis.  He does endorse a headache.       Home Medications Prior to Admission medications   Medication Sig Start Date End Date Taking? Authorizing Provider  metFORMIN (GLUCOPHAGE) 500 MG tablet Take 500 mg by mouth daily with breakfast.   Yes [provider]  atorvastatin (LIPITOR) 80 MG tablet Take 1 tablet (80 mg total) by mouth at bedtime. 01/06/24  Yes Lewanda Rife, MD  carvedilol (COREG) 6.25 MG tablet Take 1 tablet (6.25 mg total) by mouth 2 (two) times daily. 01/06/24  Yes Lewanda Rife, MD  DULoxetine (CYMBALTA) 30 MG capsule Take 1 capsule (30 mg total) by mouth daily AND 2 capsules (60 mg total) at bedtime. Patient taking differently: Take 1 capsule (30 mg total) by mouth BID 01/06/24 02/05/24 Yes Parmar, Daphine Deutscher, MD  FARXIGA 10 MG TABS tablet Take 10 mg by mouth every morning. 11/24/23  Yes [provider]  Multiple Vitamin (MULTIVITAMIN WITH MINERALS) TABS tablet Take 1 tablet by mouth daily.   Yes [provider]  oxyCODONE 10 MG TABS Take 1 tablet (10 mg total) by mouth every 6 (six) hours as needed for moderate pain (pain score 4-6). 01/06/24  Yes Lewanda Rife, MD  pantoprazole (PROTONIX) 40 MG tablet Take 1 tablet (40 mg total) by mouth daily. 01/02/24 03/02/24  Meredeth Ide, MD  QUEtiapine (SEROQUEL) 200 MG tablet Take 1 tablet (200 mg total) by mouth at bedtime. 01/06/24   Lewanda Rife, MD  sacubitril-valsartan (ENTRESTO) 49-51 MG  Take 1 tablet by mouth 2 (two) times daily. 01/07/24   Lewanda Rife, MD  spironolactone (ALDACTONE) 25 MG tablet Take 1 tablet (25 mg total) by mouth daily. 01/07/24   Lewanda Rife, MD  thiamine 100 MG tablet Take 1 tablet (100 mg total) by mouth daily. 07/24/20   Burnadette Pop, MD      Allergies    Uncoded nonscreenable allergen, Nsaids, Ibuprofen, Pork (diagnostic), Penicillins, and [name]    Review of Systems   Review of Systems  Physical Exam Updated Vital Signs BP 111/68   Pulse 95   Temp 97.9 F (36.6 C) (Oral)   Resp 18   Ht 5\' 9"  (1.753 m)   Wt 114 kg   SpO2 95%   BMI 37.11 kg/m  Physical Exam Vitals and nursing note reviewed.  Constitutional:      General: He is not in acute distress.    Appearance: He is well-developed.  HENT:     Head: Normocephalic and atraumatic.     Mouth/Throat:     Mouth: Mucous membranes are moist.  Eyes:     General: Vision grossly intact. Gaze aligned appropriately.     Extraocular Movements: Extraocular movements intact.     Conjunctiva/sclera: Conjunctivae normal.  Cardiovascular:     Rate and Rhythm: Normal rate and regular rhythm.     Pulses: Normal pulses.     Heart sounds: Normal heart sounds,  S1 normal and S2 normal. No murmur heard.    No friction rub. No gallop.  Pulmonary:     Effort: Pulmonary effort is normal. No respiratory distress.     Breath sounds: Normal breath sounds.  Abdominal:     Palpations: Abdomen is soft.     Tenderness: There is abdominal tenderness in the epigastric area. There is no guarding or rebound.     Hernia: No hernia is present.  Musculoskeletal:        General: No swelling.     Cervical back: Full passive range of motion without pain, normal range of motion and neck supple. No pain with movement, spinous process tenderness or muscular tenderness. Normal range of motion.     Right lower leg: No edema.     Left lower leg: No edema.  Skin:    General: Skin is warm and  dry.     Capillary Refill: Capillary refill takes less than 2 seconds.     Findings: No ecchymosis, erythema, lesion or wound.  Neurological:     Mental Status: He is alert and oriented to person, place, and time.     GCS: GCS eye subscore is 4. GCS verbal subscore is 5. GCS motor subscore is 6.     Cranial Nerves: Cranial nerves 2-12 are intact.     Sensory: Sensation is intact.     Motor: Motor function is intact. No weakness or abnormal muscle tone.     Coordination: Coordination is intact.  Psychiatric:        Mood and Affect: Mood normal.        Speech: Speech normal.        Behavior: Behavior normal.     ED Results / Procedures / Treatments   Labs (all labs ordered are listed, but only abnormal results are displayed) Labs Reviewed  COMPREHENSIVE METABOLIC PANEL WITH GFR - Abnormal; Notable for the following components:      Result Value   Sodium 133 (*)    CO2 18 (*)    Calcium 8.7 (*)    All other components within normal limits  LIPASE, BLOOD - Abnormal; Notable for the following components:   Lipase 81 (*)    All other components within normal limits  ETHANOL - Abnormal; Notable for the following components:   Alcohol, Ethyl (B) 167 (*)    All other components within normal limits  CBC WITH DIFFERENTIAL/PLATELET  PROTIME-INR  AMMONIA  TROPONIN I (HIGH SENSITIVITY)  TROPONIN I (HIGH SENSITIVITY)    EKG EKG Interpretation Date/Time:  Friday January 15 2024 00:48:53 EDT Ventricular Rate:  83 PR Interval:  197 QRS Duration:  102 QT Interval:  397 QTC Calculation: 467 R Axis:   -26  Text Interpretation: Sinus rhythm Multiple ventricular premature complexes Borderline left axis deviation Confirmed by Gilda Crease [phone]) on 01/15/2024 12:55:54 AM  Radiology CT HEAD WO CONTRAST ( ) Result Date: 01/15/2024 CLINICAL DATA:  Headaches and intoxication, initial encounter EXAM: CT HEAD WITHOUT CONTRAST TECHNIQUE: Contiguous axial images were obtained from  the base of the skull through the vertex without intravenous contrast. RADIATION DOSE REDUCTION: This exam was performed according to the departmental dose-optimization program which includes automated exposure control, adjustment of the mA and/or kV according to patient size and/or use of iterative reconstruction technique. COMPARISON:  12/31/2023 FINDINGS: Brain: No evidence of acute infarction, hemorrhage, hydrocephalus, extra-axial collection or mass lesion/mass effect. Mild atrophic changes are noted. Vascular: No hyperdense vessel or unexpected calcification. Skull: Normal. Negative  for fracture or focal lesion. Sinuses/Orbits: No acute finding. Other: None. IMPRESSION: Mild atrophic changes without acute abnormality. Electronically Signed   By: Alcide Clever M.D.   On: 01/15/2024 02:35   DG Chest Port 1 View Result Date: 01/15/2024 CLINICAL DATA:  Chest pain for 2 days EXAM: PORTABLE CHEST 1 VIEW COMPARISON:  12/31/2023 FINDINGS: The heart size and mediastinal contours are within normal limits. Both lungs are clear. The visualized skeletal structures are unremarkable. IMPRESSION: No acute abnormality noted. Electronically Signed   By: Alcide Clever M.D.   On: 01/15/2024 00:45    Procedures Procedures    Medications Ordered in ED Medications - No data to display  ED Course/ Medical Decision Making/ A&P                                 Medical Decision Making Amount and/or Complexity of Data Reviewed Labs: ordered. Radiology: ordered.   Differential Diagnosis considered includes, but not limited to: Cholelithiasis; cholecystitis; cholangitis; bowel obstruction; esophagitis; gastritis; peptic ulcer disease; pancreatitis; cardiac.   Patient with history of alcoholic cirrhosis presents to the emergency department with abdominal and chest pain.  He endorses drinking alcohol tonight.  Patient's exam is benign.  Slight upper abdominal tenderness on exam, no signs of peritonitis.  Blood work  without leukocytosis.  Hemoglobin normal, no signs of anemia.  Comprehensive metabolic panel unremarkable.  No anion gap.  Ammonia is not elevated.  Lipase slightly elevated at 81, he appears to have chronic slight elevations.  Could have mild pancreatitis, alcoholic gastritis also a strong likelihood.  No signs of bleeding.  Cardiac evaluation has been negative.  No EKG changes, troponin negative x 2.  CT head performed, no abnormality noted.         Final Clinical Impression(s) / ED Diagnoses Final diagnoses:  Pain of upper abdomen  Alcoholic intoxication without complication [facility])    Rx / DC Orders ED Discharge Orders     None         Kailan Carmen, Canary Brim, MD 01/15/24 [phone]

## 2024-01-15 NOTE — ED Notes (Signed)
 CCMD contacted for cardiac monitoring

## 2024-01-15 NOTE — Assessment & Plan Note (Addendum)
 Atypical in nature and reproducible on exam. Suspect musculoskeletal in nature. Troponins negative, EKG without evidence of ST elevation. CXR negative. - cardiac monitoring x12 hours - tylenol q6 PRN

## 2024-01-16 ENCOUNTER — Observation Stay (HOSPITAL_COMMUNITY): Payer: MEDICAID

## 2024-01-16 DIAGNOSIS — F32A Depression, unspecified: Secondary | ICD-10-CM | POA: Diagnosis present

## 2024-01-16 DIAGNOSIS — R400 Somnolence: Secondary | ICD-10-CM | POA: Diagnosis not present

## 2024-01-16 DIAGNOSIS — E785 Hyperlipidemia, unspecified: Secondary | ICD-10-CM | POA: Diagnosis present

## 2024-01-16 DIAGNOSIS — E860 Dehydration: Secondary | ICD-10-CM | POA: Diagnosis present

## 2024-01-16 DIAGNOSIS — G8929 Other chronic pain: Secondary | ICD-10-CM | POA: Diagnosis present

## 2024-01-16 DIAGNOSIS — N179 Acute kidney failure, unspecified: Secondary | ICD-10-CM | POA: Diagnosis not present

## 2024-01-16 DIAGNOSIS — Z7984 Long term (current) use of oral hypoglycemic drugs: Secondary | ICD-10-CM | POA: Diagnosis not present

## 2024-01-16 DIAGNOSIS — N401 Enlarged prostate with lower urinary tract symptoms: Secondary | ICD-10-CM | POA: Diagnosis present

## 2024-01-16 DIAGNOSIS — F419 Anxiety disorder, unspecified: Secondary | ICD-10-CM | POA: Diagnosis present

## 2024-01-16 DIAGNOSIS — E119 Type 2 diabetes mellitus without complications: Secondary | ICD-10-CM | POA: Diagnosis present

## 2024-01-16 DIAGNOSIS — R81 Glycosuria: Secondary | ICD-10-CM | POA: Diagnosis present

## 2024-01-16 DIAGNOSIS — E875 Hyperkalemia: Secondary | ICD-10-CM

## 2024-01-16 DIAGNOSIS — K2950 Unspecified chronic gastritis without bleeding: Secondary | ICD-10-CM | POA: Diagnosis present

## 2024-01-16 DIAGNOSIS — I509 Heart failure, unspecified: Secondary | ICD-10-CM | POA: Diagnosis not present

## 2024-01-16 DIAGNOSIS — E861 Hypovolemia: Secondary | ICD-10-CM | POA: Diagnosis not present

## 2024-01-16 DIAGNOSIS — R0789 Other chest pain: Secondary | ICD-10-CM

## 2024-01-16 DIAGNOSIS — Z79899 Other long term (current) drug therapy: Secondary | ICD-10-CM | POA: Diagnosis not present

## 2024-01-16 DIAGNOSIS — I959 Hypotension, unspecified: Secondary | ICD-10-CM | POA: Diagnosis not present

## 2024-01-16 DIAGNOSIS — K219 Gastro-esophageal reflux disease without esophagitis: Secondary | ICD-10-CM | POA: Diagnosis present

## 2024-01-16 DIAGNOSIS — F1010 Alcohol abuse, uncomplicated: Secondary | ICD-10-CM | POA: Diagnosis present

## 2024-01-16 DIAGNOSIS — I5022 Chronic systolic (congestive) heart failure: Secondary | ICD-10-CM | POA: Diagnosis present

## 2024-01-16 DIAGNOSIS — I110 Hypertensive heart disease with heart failure: Secondary | ICD-10-CM | POA: Diagnosis present

## 2024-01-16 LAB — URINALYSIS, ROUTINE W REFLEX MICROSCOPIC
Bacteria, UA: NONE SEEN
Bilirubin Urine: NEGATIVE
Glucose, UA: >=500 mg/dL — AB
Hgb urine dipstick: NEGATIVE
Ketones, ur: NEGATIVE mg/dL
Leukocytes,Ua: NEGATIVE
Nitrite: NEGATIVE
Protein, ur: NEGATIVE mg/dL
Specific Gravity, Urine: 1.005 (ref 1.005–1.030)
pH: 5 (ref 5.0–8.0)

## 2024-01-16 LAB — COMPREHENSIVE METABOLIC PANEL WITH GFR
ALT: 16 U/L (ref 0–44)
AST: 19 U/L (ref 15–41)
Albumin: 3 g/dL — ABNORMAL LOW (ref 3.5–5.0)
Alkaline Phosphatase: 60 U/L (ref 38–126)
Anion gap: 4 — ABNORMAL LOW (ref 5–15)
BUN: 23 mg/dL — ABNORMAL HIGH (ref 6–20)
CO2: 25 mmol/L (ref 22–32)
Calcium: 8 mg/dL — ABNORMAL LOW (ref 8.9–10.3)
Chloride: 102 mmol/L (ref 98–111)
Creatinine, Ser: 2.36 mg/dL — ABNORMAL HIGH (ref 0.61–1.24)
GFR, Estimated: 31 mL/min — ABNORMAL LOW (ref >60)
Glucose, Bld: 121 mg/dL — ABNORMAL HIGH (ref 70–99)
Potassium: 4.2 mmol/L (ref 3.5–5.1)
Sodium: 131 mmol/L — ABNORMAL LOW (ref 135–145)
Total Bilirubin: 0.7 mg/dL (ref 0.0–1.2)
Total Protein: 5.7 g/dL — ABNORMAL LOW (ref 6.5–8.1)

## 2024-01-16 LAB — CBC
HCT: 34.3 % — ABNORMAL LOW (ref 39.0–52.0)
HCT: 32 % — ABNORMAL LOW (ref 39.0–52.0)
Hemoglobin: 11.8 g/dL — ABNORMAL LOW (ref 13.0–17.0)
Hemoglobin: 10.8 g/dL — ABNORMAL LOW (ref 13.0–17.0)
MCH: 30.6 pg (ref 26.0–34.0)
MCH: 30.3 pg (ref 26.0–34.0)
MCHC: 34.4 g/dL (ref 30.0–36.0)
MCHC: 33.8 g/dL (ref 30.0–36.0)
MCV: 89.1 fL (ref 80.0–100.0)
MCV: 89.6 fL (ref 80.0–100.0)
Platelets: 272 10*3/uL (ref 150–400)
Platelets: 226 10*3/uL (ref 150–400)
RBC: 3.85 MIL/uL — ABNORMAL LOW (ref 4.22–5.81)
RBC: 3.57 MIL/uL — ABNORMAL LOW (ref 4.22–5.81)
RDW: 14.9 % (ref 11.5–15.5)
RDW: 14.8 % (ref 11.5–15.5)
WBC: 6.7 10*3/uL (ref 4.0–10.5)
WBC: 6.1 10*3/uL (ref 4.0–10.5)
nRBC: 0 % (ref 0.0–0.2)
nRBC: 0 % (ref 0.0–0.2)

## 2024-01-16 LAB — BASIC METABOLIC PANEL WITH GFR
Anion gap: 13 (ref 5–15)
Anion gap: 8 (ref 5–15)
BUN: 19 mg/dL (ref 6–20)
BUN: 20 mg/dL (ref 6–20)
CO2: 19 mmol/L — ABNORMAL LOW (ref 22–32)
CO2: 24 mmol/L (ref 22–32)
Calcium: 8.3 mg/dL — ABNORMAL LOW (ref 8.9–10.3)
Calcium: 8.6 mg/dL — ABNORMAL LOW (ref 8.9–10.3)
Chloride: 97 mmol/L — ABNORMAL LOW (ref 98–111)
Chloride: 100 mmol/L (ref 98–111)
Creatinine, Ser: 2.5 mg/dL — ABNORMAL HIGH (ref 0.61–1.24)
Creatinine, Ser: 2.72 mg/dL — ABNORMAL HIGH (ref 0.61–1.24)
GFR, Estimated: 29 mL/min — ABNORMAL LOW (ref >60)
GFR, Estimated: 26 mL/min — ABNORMAL LOW (ref >60)
Glucose, Bld: 97 mg/dL (ref 70–99)
Glucose, Bld: 106 mg/dL — ABNORMAL HIGH (ref 70–99)
Potassium: 4.1 mmol/L (ref 3.5–5.1)
Potassium: 4 mmol/L (ref 3.5–5.1)
Sodium: 129 mmol/L — ABNORMAL LOW (ref 135–145)
Sodium: 132 mmol/L — ABNORMAL LOW (ref 135–145)

## 2024-01-16 LAB — SODIUM, URINE, RANDOM: Sodium, Ur: 18 mmol/L

## 2024-01-16 LAB — TROPONIN I (HIGH SENSITIVITY): Troponin I (High Sensitivity): 9 ng/L (ref <18)

## 2024-01-16 LAB — CREATININE, URINE, RANDOM: Creatinine, Urine: 105 mg/dL

## 2024-01-16 MED ORDER — LACTATED RINGERS IV BOLUS
1000.0000 mL | Freq: Once | INTRAVENOUS | Status: AC
  Administered 2024-01-16: 1000 mL via INTRAVENOUS

## 2024-01-16 MED ORDER — LACTATED RINGERS IV BOLUS
500.0000 mL | Freq: Once | INTRAVENOUS | Status: AC
  Administered 2024-01-16: 500 mL via INTRAVENOUS

## 2024-01-16 MED ORDER — ACETAMINOPHEN 650 MG RE SUPP: 650.0000 mg | Freq: Four times a day (QID) | RECTAL | Status: DC | PRN

## 2024-01-16 MED ORDER — ACETAMINOPHEN 500 MG PO TABS
1000.0000 mg | ORAL_TABLET | Freq: Four times a day (QID) | ORAL | Status: DC | PRN
Start: 2024-01-16 — End: 2024-01-18
  Administered 2024-01-16: 1000 mg via ORAL
  Filled 2024-01-16: qty 2

## 2024-01-16 MED ORDER — PROCHLORPERAZINE EDISYLATE 10 MG/2ML IJ SOLN
10.0000 mg | Freq: Once | INTRAMUSCULAR | Status: AC
  Administered 2024-01-16: 10 mg via INTRAVENOUS
  Filled 2024-01-16: qty 2

## 2024-01-16 NOTE — Progress Notes (Signed)
     Daily Progress Note Intern Pager: [phone]  Patient name: Jacob Adams Medical record number: [id number] Date of birth: Aug 31, 1966 Age: 58 y.o. Gender: male  Primary Care Provider: [facility] Medical Consultants: None Code Status: FULL  Pt Overview and Major Events to Date:  3/28: Admitted  Assessment and Plan:  Jacob Adams is a 58 y.o. male who presented with chest pain, abdominal pain, and hyperkalemia now resolved.  Found to have worsening AKI. Assessment & Plan AKI (acute kidney injury) (HCC) Cr 1.51-> 2.50. Euvolemic on exam, not likely related to CHF. Caution with aggressive IV fluids. Reports weak urinary stream. Given sharp rise in Cr, plan for further work-up. 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 High risk for withdraw. - CIWAs without ativan - cardiac monitoring x12 hours - low threshold to initiate ativan if indicated by CIWA scores - thiamine, folic acid, MVI Abdominal pain Resolved. - tylenol q6 PRN - zofran 4 mg q6 PRN - continue home protonix 40 mg daily - F/u outpt w/ GI for EGD/colonoscopy Hyperkalemia Resolved s/p lokelma. -Daily BMP Chest pain Resolved. Chronic health problem CHF: Appears euvolemic. Not on lasix/torsemide at home.  Hold Entresto/spironolactone in setting of AKI.  Add back when able. Anxiety/Depression: continue home duloxetine 30 mg in AM, 60 mg in PM Bipolar 1: continue home seroquel 900 mg at bedtime HTN: continue home coreg 6.25 mg BID. Hold entresto 49-51 mg BID, spironolactone 25 mg daily T2DM: Hold home farxiga 10 mg daily, holding home metformin HLD: continue home atorvastatin 80 mg  FEN/GI: Carb modified. PPx: Lovenox (monitor renal  function) Dispo:Home pending clinical improvement . Subjective:  Reports feeling better. No acute concerns today.  Objective: Temp:  [98.3 F (36.8 C)-98.7 F (37.1 C)] 98.4 F (36.9 C) (03/29 0805) Pulse Rate:  [86-104] 104 (03/29 0805) Resp:  [16-19] 18 (03/29 0805) BP: (103-160)/(60-98) 103/71 (03/29 0805) SpO2:  [96 %-100 %] 98 % (03/29 0805) Weight:  [104.3 kg] 104.3 kg (03/28 1644) Physical Exam: General: NAD, resting in bed Cardiovascular: RRR Respiratory: CTAB, normal WOB on RA Abdomen: Soft, not tender, not distended Extremities: No edema, moving all 4 extremities  Laboratory: Most recent CBC Lab Results  Component Value Date   WBC 6.7 01/16/2024   HGB 11.8 (L) 01/16/2024   HCT 34.3 (L) 01/16/2024   MCV 89.1 01/16/2024   PLT 272 01/16/2024   Most recent BMP    Latest Ref Rng & Units 01/16/2024    6:30 AM  BMP  Glucose 70 - 99 mg/dL 97   BUN 6 - 20 mg/dL 19   Creatinine 7.25 - 1.24 mg/dL 3.66   Sodium 440 - 347 mmol/L 129   Potassium 3.5 - 5.1 mmol/L 4.1   Chloride 98 - 111 mmol/L 97   CO2 22 - 32 mmol/L 19   Calcium 8.9 - 10.3 mg/dL 8.3    Tiffany Kocher, DO 01/16/2024, 9:15 AM  PGY-2, [facility] FPTS Intern pager: [phone], text pages welcome Secure chat group [facility] Teaching Service

## 2024-01-16 NOTE — Assessment & Plan Note (Signed)
 Resolved s/p lokelma. -Daily BMP

## 2024-01-16 NOTE — Assessment & Plan Note (Signed)
 CHF: Appears euvolemic. Not on lasix/torsemide at home.  Hold Entresto/spironolactone in setting of AKI.  Add back when able. Anxiety/Depression: continue home duloxetine 30 mg in AM, 60 mg in PM Bipolar 1: continue home seroquel 900 mg at bedtime HTN: continue home coreg 6.25 mg BID. Hold entresto 49-51 mg BID, spironolactone 25 mg daily T2DM: Hold home farxiga 10 mg daily, holding home metformin HLD: continue home atorvastatin 80 mg

## 2024-01-16 NOTE — Plan of Care (Addendum)
 FMTS Brief Progress Note  S: Went to bedside after being notified by RN that BP was dropping as low as 88/58 with MAP 66.  92/62 with MAP 72 on repeat.  Patient admits to frontal migraine headache worse on the right side which he states is consistent with migraines he gets at home.  Denies any nausea, chest pain, shortness of breath.  Otherwise states he is feeling better than when he came in.   O: BP (!) 88/58 (BP Location: Right Arm)   Pulse 93   Temp 99.1 F (37.3 C) (Oral)   Resp 19   Ht 5\' 10"  (1.778 m)   Wt 104.3 kg   SpO2 99%   BMI 33.00 kg/m   General: Overall well-appearing 59 year old male lying in bed, NAD Cardio: RRR, normal S1/S2, no murmur Lungs: CTAB, normal effort Extremities: No edema BLEs Neuro: Alert, oriented, cranial nerves II through XII intact, sensation intact, finger-nose-finger test normal  A/P: Hypotension and AKI Possibly in setting of dehydration after a few days of vomiting.  S/p 500 ml bolus LR earlier this evening. He is overall well appearing and not fluid overloaded on exam.  - Will give another 500 ml bolus - encourage po intake  - hold night time dose of coreg  - CTM  Migraine Neuroexam reassuring.  Cannot give NSAIDs in setting of AKI and triptans contraindicated d/t CHF. -Tylenol 1000 mg and Compazine 10 mg IV   Erick Alley, DO 01/16/2024, 8:21 PM PGY-3, [name] Night Resident  Please page [phone] with questions.

## 2024-01-16 NOTE — Plan of Care (Signed)
 FMTS Brief Progress Note  S: Called to bedside due to continued worsening hypotension Patient lying in bed with eyes closed but able to sit up and talk when prompted.  Denies any pain stating his headache from earlier has improved with Compazine and Tylenol.  Denies any chest pain or shortness of breath.  Admits to being slightly lightheaded and feeling tired   O: BP (!) 73/55 (BP Location: Right Arm)   Pulse 100   Temp 99.1 F (37.3 C) (Oral)   Resp 19   Ht 5\' 10"  (1.778 m)   Wt 104.3 kg   SpO2 100%   BMI 33.00 kg/m   General: Somewhat somnolent, NAD Cardio: RRR, normal S1/S2, no murmur Lungs: CTAB, normal effort Extremities: No edema BLEs Neuro: Somewhat somnolent however able to sit up and answer questions appropriately when prompted.  Oriented to person, place, and year.  Does have pinpoint pupils, cranial nerves II through XII otherwise intact, sensation intact, 5/5 muscle strength of BLEs and BUEs  A/P: Hypotension and somnolence Thought to be secondary to past few days of vomiting however it is odd that the hypotension started after his vomiting resolved.  Mild somnolence could be in setting of receiving nightly dose of Seroquel. No known opioid ingestion to explain pin point pupils.   S/p 500 mL liter bolus LR x 2 without improvement in BP. Will be thoughtful with additional fluids given HFrEF but currently does not appear fluid overloaded No chest pain but need to rule out cardiac etiology -Will give additional 1 L bolus LR -If no improvement in BP or if patient becomes unresponsive, plan to consult ICU -Stat EKG and trops -Stat CBC and CMP    Erick Alley, DO 01/16/2024, 10:49 PM PGY-3,  Family Medicine Night Resident  Please page [phone] with questions.

## 2024-01-16 NOTE — Assessment & Plan Note (Signed)
 Will need to keep additional day given Cr rise. High risk for withdraw. - CIWAs without ativan - cardiac monitoring x12 hours - low threshold to initiate ativan if indicated by CIWA scores - thiamine, folic acid, MVI

## 2024-01-16 NOTE — Assessment & Plan Note (Signed)
 Cr 1.51-> 2.50. Euvolemic on exam, not likely related to CHF. Caution with aggressive IV fluids. Reports weak urinary stream. Given sharp rise in Cr, plan for further work-up. -Bladder scan -Consider starting Flomax -UA,Urine Na/Cr -Renal U/S -Avoid nephro-toxic agents -Held home meds (see below) -PO fluids -Consider additional IV bolus, monitor for signs of hypervolemia -PM BMP -Daily BMP

## 2024-01-16 NOTE — Assessment & Plan Note (Signed)
 Resolved

## 2024-01-16 NOTE — Plan of Care (Signed)

## 2024-01-16 NOTE — Plan of Care (Signed)
 Went to evaluate patient at bedside.  Renal ultrasound within normal limits, bladder scans without evidence of retaining, patient is having adequate p.o. at bedside. Calculated Fena suggests pre-renal.   Reviewed x-ray on admission, no evidence of volume overload.  Patient is laying flat without SOB, on room air.  O No leg edema, no JVP  A/P AKI Overall suspect dehydration is likely culprit.  Despite CHF, appears euvolemic and possibly even fluid down. - 500 mL LR bolus - Continue p.o. - Avoid nephrotoxic agents - A.m. BMP

## 2024-01-16 NOTE — Assessment & Plan Note (Signed)
 Resolved. - tylenol q6 PRN - zofran 4 mg q6 PRN - continue home protonix 40 mg daily - F/u outpt w/ GI for EGD/colonoscopy

## 2024-01-17 ENCOUNTER — Inpatient Hospital Stay (HOSPITAL_COMMUNITY): Payer: MEDICAID

## 2024-01-17 DIAGNOSIS — E861 Hypovolemia: Secondary | ICD-10-CM | POA: Diagnosis not present

## 2024-01-17 DIAGNOSIS — I509 Heart failure, unspecified: Secondary | ICD-10-CM

## 2024-01-17 DIAGNOSIS — I959 Hypotension, unspecified: Secondary | ICD-10-CM | POA: Insufficient documentation

## 2024-01-17 DIAGNOSIS — N179 Acute kidney failure, unspecified: Secondary | ICD-10-CM | POA: Diagnosis not present

## 2024-01-17 DIAGNOSIS — R400 Somnolence: Secondary | ICD-10-CM

## 2024-01-17 LAB — BASIC METABOLIC PANEL WITH GFR
Anion gap: 10 (ref 5–15)
BUN: 20 mg/dL (ref 6–20)
CO2: 25 mmol/L (ref 22–32)
Calcium: 8.8 mg/dL — ABNORMAL LOW (ref 8.9–10.3)
Chloride: 102 mmol/L (ref 98–111)
Creatinine, Ser: 1.9 mg/dL — ABNORMAL HIGH (ref 0.61–1.24)
GFR, Estimated: 40 mL/min — ABNORMAL LOW (ref >60)
Glucose, Bld: 126 mg/dL — ABNORMAL HIGH (ref 70–99)
Potassium: 4.3 mmol/L (ref 3.5–5.1)
Sodium: 137 mmol/L (ref 135–145)

## 2024-01-17 LAB — URINALYSIS, ROUTINE W REFLEX MICROSCOPIC
Bilirubin Urine: NEGATIVE
Glucose, UA: >=500 mg/dL — AB
Hgb urine dipstick: NEGATIVE
Ketones, ur: NEGATIVE mg/dL
Leukocytes,Ua: NEGATIVE
Nitrite: NEGATIVE
Protein, ur: NEGATIVE mg/dL
Specific Gravity, Urine: 1.001 — ABNORMAL LOW (ref 1.005–1.030)
pH: 5 (ref 5.0–8.0)

## 2024-01-17 LAB — CBC
HCT: 35.5 % — ABNORMAL LOW (ref 39.0–52.0)
Hemoglobin: 11.7 g/dL — ABNORMAL LOW (ref 13.0–17.0)
MCH: 29.9 pg (ref 26.0–34.0)
MCHC: 33 g/dL (ref 30.0–36.0)
MCV: 90.8 fL (ref 80.0–100.0)
Platelets: 251 10*3/uL (ref 150–400)
RBC: 3.91 MIL/uL — ABNORMAL LOW (ref 4.22–5.81)
RDW: 15.3 % (ref 11.5–15.5)
WBC: 5.4 10*3/uL (ref 4.0–10.5)
nRBC: 0 % (ref 0.0–0.2)

## 2024-01-17 LAB — GLUCOSE, CAPILLARY: Glucose-Capillary: 112 mg/dL — ABNORMAL HIGH (ref 70–99)

## 2024-01-17 LAB — CORTISOL: Cortisol, Plasma: 10.7 ug/dL

## 2024-01-17 MED ORDER — PERFLUTREN LIPID MICROSPHERE
1.0000 mL | INTRAVENOUS | Status: AC | PRN
  Administered 2024-01-17: 4 mL via INTRAVENOUS

## 2024-01-17 MED ORDER — QUETIAPINE FUMARATE ER 200 MG PO TB24
200.0000 mg | ORAL_TABLET | Freq: Every day | ORAL | Status: DC
  Administered 2024-01-17: 200 mg via ORAL
  Filled 2024-01-17 (×2): qty 1

## 2024-01-17 MED ORDER — NALOXONE HCL 0.4 MG/ML IJ SOLN
0.4000 mg | Freq: Once | INTRAMUSCULAR | Status: AC
  Administered 2024-01-17: 0.4 mg via INTRAVENOUS
  Filled 2024-01-17: qty 1

## 2024-01-17 NOTE — Plan of Care (Signed)

## 2024-01-17 NOTE — Progress Notes (Signed)
 Echocardiogram 2D Echocardiogram has been performed.  Jacob Adams 01/17/2024, 5:11 PM

## 2024-01-17 NOTE — Progress Notes (Addendum)
 Daily Progress Note Intern Pager: [phone]  Patient name: Jacob Adams Medical record number: [id number] Date of birth: [birth year] Age: 58 y.o. Gender: male  Primary Care Provider: [facility] Medical Consultants: None Code Status: Full  Pt Overview and Major Events to Date:  3/28: Admitted  Assessment and Plan:  Christoper Bushey is a 58 year old male presented with chest pain, abdominal pain and hyperkalemia which are now resolved.  Currently being managed for worsening AKI. Assessment & Plan AKI (acute kidney injury) (HCC) Unclear cause of AKI but suspect prerenal given multiple emesis in the last few days. UA recently shows glycosuria without significant hyperglycemia. Patient's home med include farxiga which he last took on 3/29. Unsure if his SGLT2 med last taking few days ago is responsible for these findings or  possible renal tubular etiology.   Renal US generally unremarkable other than benign cyst and enlarged BPH. 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  Remains appropriate for the next 24 hours can consider discontinuing CIWA protocol. - CIWAs without ativan - thiamine, folic acid, MVI Abdominal pain Resolved. - continue home protonix 40 mg daily - F/u outpt w/ GI for EGD/colonoscopy Hypotension Borderline hypotensive BP readings with reported lightheadedness, MAP has been appropriate and patient has required multiple bolus IV due to suspicion for dehydration following nausea and vomiting for the past few days.  However UA recently shows glycosuria and with patient's report of polyuria suspect hypotensive BP could be nephrogenic.  -Orthostatic vitals -Monitor BP with routine vitals -Holding most home BP medications including farxiga -Given soft BPs could hold home Coreg until BP appropriate Chronic health problem CHF: Appears  euvolemic. Not on lasix/torsemide at home.  Hold Entresto/spironolactone in setting of AKI.  Add back when able. Anxiety/Depression: continue home duloxetine 30 mg in AM, 60 mg in PM Bipolar 1: updated home seroquel  to 200 mg at bedtime HTN: Hold home coreg 6.25 mg BID. Hold entresto 49-51 mg BID, spironolactone 25 mg daily T2DM: Hold home farxiga 10 mg daily, holding home metformin HLD: continue home atorvastatin 80 mg   FEN/GI: Carb modified PPx: Lovenox Dispo:Home pending clinical improvement . Barriers include worsening AKI  Subjective:  Patient is feeling much better denies any chest pain, or abdominal pain.  Reports intermittent lightheadedness, no dyspnea or urinary symptoms other than increased urination.  Objective: Temp:  [97.4 F (36.3 C)-99.1 F (37.3 C)] 98.8 F (37.1 C) (03/30 0817) Pulse Rate:  [85-100] 85 (03/30 0817) Resp:  [18-19] 19 (03/30 0817) BP: (73-132)/(55-89) 99/75 (03/30 0817) SpO2:  [91 %-100 %] 100 % (03/30 0817) Physical Exam: General: Alert, well appearing, NAD CV: RRR, no murmurs, normal S1/S2 Pulm: CTAB, good WOB on RA, no crackles or wheezing Abd: Soft, no distension, no tenderness  but mild discomfort with palpitation  Ext: No BLE edema  Laboratory: Most recent CBC Lab Results  Component Value Date   WBC 5.4 01/17/2024   HGB 11.7 (L) 01/17/2024   HCT 35.5 (L) 01/17/2024   MCV 90.8 01/17/2024   PLT 251 01/17/2024   Most recent BMP    Latest Ref Rng & Units 01/17/2024    7:27 AM  BMP  Glucose 70 - 99 mg/dL 132   BUN 6 - 20 mg/dL 20   Creatinine 4.40 - 1.24 mg/dL 1.02   Sodium 725 - 366 mmol/L 137   Potassium 3.5 - 5.1  mmol/L 4.3   Chloride 98 - 111 mmol/L 102   CO2 22 - 32 mmol/L 25   Calcium 8.9 - 10.3 mg/dL 8.8     Imaging/Diagnostic Tests: US RENAL Result Date: 01/16/2024 IMPRESSION: No hydronephrosis, benign exophytic cyst and enlarged prostrate.Jerre Simon, MD 01/17/2024, 9:57 AM  PGY-3, [facility] FPTS Intern pager: [phone], text pages welcome Secure chat group [facility] Teaching Service

## 2024-01-17 NOTE — Assessment & Plan Note (Addendum)
 Borderline hypotensive BP readings with reported lightheadedness, MAP has been appropriate and patient has required multiple bolus IV due to suspicion for dehydration following nausea and vomiting for the past few days.  However UA recently shows glycosuria and with patient's report of polyuria suspect hypotensive BP could be nephrogenic.  -Orthostatic vitals -Monitor BP with routine vitals -Holding most home BP medications including farxiga -Given soft BPs could hold home Coreg until BP appropriate

## 2024-01-17 NOTE — Assessment & Plan Note (Addendum)
 Unclear cause of AKI but suspect prerenal given multiple emesis in the last few days. UA recently shows glycosuria without significant hyperglycemia. Patient's home med include farxiga which he last took on 3/29. Unsure if his SGLT2 med last taking few days ago is responsible for these findings or  possible renal tubular etiology.   Renal US generally unremarkable other than benign cyst and enlarged BPH. -Avoid nephro-toxic agents -Held home meds (including home farxiga) -PO fluids -Daily BMP

## 2024-01-17 NOTE — Assessment & Plan Note (Addendum)
 Resolved. - continue home protonix 40 mg daily - F/u outpt w/ GI for EGD/colonoscopy

## 2024-01-17 NOTE — Assessment & Plan Note (Deleted)
 Resolved s/p lokelma. -Daily BMP

## 2024-01-17 NOTE — Plan of Care (Addendum)
 BP still low but improved s/p another 1 L LR bolus No change in EKG from prior, trops low, CBC with likely dilutional anemia, CMP with slightly improved creatinine. Pt sleeping, able to wake and answer questions appropriately when prompted but quickly goes back to sleep Pupils still constricted and was recently prescribed oxycodone so trailed Narcan with no response Reassuringly, headache has not returned and pt is with no complaints and just wants to sleep Somnolence most likely in setting of late hour and 900 mg home Seroquel

## 2024-01-17 NOTE — Assessment & Plan Note (Addendum)
 CIWAs have been appropriate (1-0-0).  Remains appropriate for the next 24 hours can consider discontinuing CIWA protocol. - CIWAs without ativan - thiamine, folic acid, MVI

## 2024-01-17 NOTE — Assessment & Plan Note (Addendum)
 CHF: Appears euvolemic. Not on lasix/torsemide at home.  Hold Entresto/spironolactone in setting of AKI.  Add back when able. Anxiety/Depression: continue home duloxetine 30 mg in AM, 60 mg in PM Bipolar 1: updated home seroquel  to 200 mg at bedtime HTN: Hold home coreg 6.25 mg BID. Hold entresto 49-51 mg BID, spironolactone 25 mg daily T2DM: Hold home farxiga 10 mg daily, holding home metformin HLD: continue home atorvastatin 80 mg

## 2024-01-17 NOTE — Assessment & Plan Note (Deleted)
 Resolved

## 2024-01-18 ENCOUNTER — Other Ambulatory Visit (HOSPITAL_COMMUNITY): Payer: Self-pay

## 2024-01-18 DIAGNOSIS — N179 Acute kidney failure, unspecified: Secondary | ICD-10-CM | POA: Diagnosis not present

## 2024-01-18 LAB — ECHOCARDIOGRAM COMPLETE
AR max vel: 5.66 cm2
AV Peak grad: 3.4 mmHg
Ao pk vel: 0.92 m/s
Area-P 1/2: 3.6 cm2
Height: 70 in
S' Lateral: 3.9 cm
Weight: 3680 [oz_av]

## 2024-01-18 LAB — BASIC METABOLIC PANEL WITH GFR
Anion gap: 10 (ref 5–15)
BUN: 16 mg/dL (ref 6–20)
CO2: 25 mmol/L (ref 22–32)
Calcium: 8.8 mg/dL — ABNORMAL LOW (ref 8.9–10.3)
Chloride: 104 mmol/L (ref 98–111)
Creatinine, Ser: 1.2 mg/dL (ref 0.61–1.24)
GFR, Estimated: >60 mL/min (ref >60)
Glucose, Bld: 80 mg/dL (ref 70–99)
Potassium: 4 mmol/L (ref 3.5–5.1)
Sodium: 139 mmol/L (ref 135–145)

## 2024-01-18 LAB — ACTH: C206 ACTH: 26.2 pg/mL (ref 7.2–63.3)

## 2024-01-18 LAB — GLUCOSE, CAPILLARY: Glucose-Capillary: 194 mg/dL — ABNORMAL HIGH (ref 70–99)

## 2024-01-18 MED ORDER — FOLIC ACID 1 MG PO TABS: 1.0000 mg | ORAL_TABLET | Freq: Every day | ORAL | 0 refills | Status: DC

## 2024-01-18 MED ORDER — THIAMINE HCL 100 MG PO TABS
100.0000 mg | ORAL_TABLET | Freq: Every day | ORAL | 0 refills | Status: DC
  Filled 2024-01-18: qty 30, 30d supply, fill #0

## 2024-01-18 MED ORDER — ADULT MULTIVITAMIN W/MINERALS CH
1.0000 | ORAL_TABLET | Freq: Every day | ORAL | 0 refills | Status: AC
  Filled 2024-01-18: qty 30, 30d supply, fill #0

## 2024-01-18 MED ORDER — POLYETHYLENE GLYCOL 3350 17 GM/SCOOP PO POWD
17.0000 g | Freq: Every day | ORAL | 0 refills | Status: DC | PRN
  Filled 2024-01-18: qty 238, 14d supply, fill #0

## 2024-01-18 NOTE — Plan of Care (Signed)

## 2024-01-18 NOTE — TOC Initial Note (Addendum)
 Transition of Care [facility]) - Initial/Assessment Note    Patient Details  Name: Jacob Adams MRN: [id number] Date of Birth: May 26, 1966  Transition of Care [facility]) CM/SW Contact:    Callum Wolf A [place], LCSW Phone Number: 01/18/2024, 12:55 PM  Clinical Narrative:                  CSW met with pt at bedside. He stated that he had a recent episode of binging alcohol and report that it was his choice because he was experiencing significant stressors at home. He said that his son and daughter and their children have been temporarily staying with pt and that he has been supporting them the last few months. He said that previously, he had been sober from alcohol use, since fall of 2023.   He said that he is service connected with the [facility] and has resources for outpatient alcohol use and counseling treatment, medication management, as well as a PCP and did not need additional resources. He stated that they are aware that he is at the hospital and that follow up has been scheduled for him. CSW provided support counseling and discussed briefly options for other coping strategies.  He stated that his sister is providing transportation at discharge and that he is staying with her a few days so he can continue to limit his stressors and facilitate a positive transition back into the community.   He did request CSW provide resources for food pantry/food bank to assist his family.   CSW provided list at bedside to pt.   No other needs identified at this time. TOC will sign off, please consult again if TOC needs arise.    Expected Discharge Plan: Home/Self Care Barriers to Discharge: Continued Medical Work up   Patient Goals and CMS Choice            Expected Discharge Plan and Services                                              Prior Living Arrangements/Services                       Activities of Daily Living   ADL Screening (condition at time of admission) Independently  performs ADLs?: Yes (appropriate for developmental age) Is the patient deaf or have difficulty hearing?: No Does the patient have difficulty seeing, even when wearing glasses/contacts?: No Does the patient have difficulty concentrating, remembering, or making decisions?: No  Permission Sought/Granted                  Emotional Assessment Appearance:: Appears stated age Attitude/Demeanor/Rapport: Engaged Affect (typically observed): Appropriate Orientation: : Oriented to Self, Oriented to Place, Oriented to  Time, Oriented to Situation Alcohol / Substance Use: Alcohol Use, Tobacco Use Psych Involvement: No (comment)  Admission diagnosis:  Hyperkalemia [E87.5] Atypical chest pain [R07.89] AKI (acute kidney injury) (HCC) [N17.9] Patient Active Problem List   Diagnosis Date Noted   Hypotension 01/17/2024   Chronic health problem 01/15/2024   AKI (acute kidney injury) (HCC) 01/15/2024   History of seizure due to alcohol withdrawal 01/15/2024   Abdominal pain 01/15/2024   Major depressive disorder, 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   Major depressive disorder, recurrent, severe without psychotic behavior (HCC) 01/02/2024   Low back pain 12/31/2023   Lactic acid  acidosis 12/31/2023   Low glucose level 12/31/2023   Suicidal behavior 12/31/2023   Chronic colitis 04/25/2022   Alcoholic cirrhosis of liver without ascites (HCC) 04/25/2022   Portal hypertensive gastropathy (HCC) 04/25/2022   History of anemia 04/25/2022   Hyponatremia 01/23/2022   High anion gap metabolic acidosis 01/23/2022   Hyperkalemia 01/23/2022   Fatty liver 01/23/2022   Insomnia 10/04/2020   AMS (altered mental status) 07/23/2020   Sleep apnea    Hypertension    Acute metabolic encephalopathy 01/24/2020   Normocytic anemia 08/15/2019   Hypothyroidism 08/15/2019   HLD (hyperlipidemia) 08/15/2019   Anxiety and depression 08/15/2019   Chronic systolic CHF (congestive heart  failure) (HCC) 02/08/2019   Tobacco dependence 02/08/2019   Alcohol withdrawal (HCC) 01/2019   HTN (hypertension) 12/24/2018   Alcohol abuse 01/06/2016   MDD (major depressive disorder), recurrent, severe, 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/03/2015   Alcohol use disorder, severe, dependence (HCC) 11/03/2015   History of GI bleed 10/31/2015   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 [address] Phone: [phone] Fax: [phone]  [facility] [place], [place] - [address] [place] [address] Phone: [phone] Fax: [phone]  [facility] [address] [place] [address] Phone: [phone] Fax: [phone]     Social Drivers of Health (SDOH) Social History: SDOH Screenings   Food Insecurity: Food Insecurity Present (01/15/2024)  Housing: Low Risk  (01/15/2024)  Transportation Needs: No Transportation Needs (01/15/2024)  Recent Concern: Transportation Needs - Unmet Transportation Needs (12/31/2023)  Utilities: Not At Risk (01/15/2024)  Alcohol Screen: Low Risk  (01/04/2024)  Depression (PHQ2-9): Medium Risk (06/29/2020)  Financial Resource Strain: Not on File (10/03/2022)   Received from [place], [place]  Physical Activity: Not on File (10/03/2022)   Received from [place], [place]  Social Connections: Moderately Isolated (01/04/2024)  Stress: Not on File (10/03/2022)   Received from [place], [place]  Tobacco Use: High Risk (01/15/2024)   SDOH Interventions: Food Insecurity Interventions: [employer] Provided [employer] resource guide provided)   Readmission Risk  Interventions    01/18/2024   12:53 PM 01/01/2024    2:34 PM  Readmission Risk Prevention Plan  Post Dischage Appt  Complete  Medication Screening  Complete  Transportation Screening Complete Complete  PCP or Specialist Appt within 3-5 Days Complete   HRI or Home Care Consult Complete   Social Work Consult for Recovery Care Planning/Counseling Complete   Palliative Care Screening Not Applicable   Medication Review Oceanographer) Complete

## 2024-01-18 NOTE — Progress Notes (Signed)
 AVS reviewed with patient, all questions answered. Transported to DC lounge with all belongings. PIV removed prior to DC.

## 2024-01-18 NOTE — Evaluation (Signed)
 Physical Therapy Brief Evaluation and Discharge Note Patient Details Name: Jacob Adams MRN: [id number] DOB: 11/24/1965 Today's Date: 01/18/2024   History of Present Illness  Pt is a 58 y/o M presenting to ED on 3/28 with chest and abdominal pain, admitted for AKI and hyperkalemia. PMH includes knee replacement, alcohol withdrawal, anxiety, HTN, pancreatitis, OSA, migraines  Clinical Impression  Pt doing well with mobility and no further PT needed.  Ready for dc from PT standpoint.        PT Assessment Patient does not need any further PT services  Assistance Needed at Discharge       Equipment Recommendations None recommended by PT  Recommendations for Other Services       Precautions/Restrictions Precautions Precautions: None Restrictions Weight Bearing Restrictions Per Provider Order: No        Mobility  Bed Mobility   Supine/Sidelying to sit: Independent Sit to supine/sidelying: Independent    Transfers Overall transfer level: Independent Equipment used: None Transfers: Sit to/from Stand Sit to Stand: Independent                Ambulation/Gait Ambulation/Gait assistance: Independent Gait Distance (Feet): 500 Feet Assistive device: None Gait Pattern/deviations: WFL(Within Functional Limits) Gait Speed: Below normal General Gait Details: Steady gait  Home Activity Instructions    Stairs Stairs: Yes Stairs assistance: Modified independent (Device/Increase time) Stair Management: One rail Right, Step to pattern, Forwards Number of Stairs: 1    Modified Rankin (Stroke Patients Only)        Balance Overall balance assessment: Mild deficits observed, not formally tested                        Pertinent Vitals/Pain   Pain Assessment Pain Assessment: No/denies pain     Home Living Family/patient expects to be discharged to:: Private residence Living Arrangements: Children;Other relatives Available Help at Discharge:  Family;Available 24 hours/day Home Environment: Stairs to enter  [employer] of Steps: 1 Home Equipment: Other (comment)   Additional Comments: cpap    Prior Function Level of Independence: Independent      UE/LE Assessment   UE ROM/Strength/Tone/Coordination: WFL    LE ROM/Strength/Tone/Coordination: [facility]      Communication   Communication Communication: No apparent difficulties     Cognition Overall Cognitive Status: Appears within functional limits for tasks assessed/performed       General Comments General comments (skin integrity, edema, etc.): BP 126/93 at end of session    Exercises     Assessment/Plan    PT Problem List         PT Visit Diagnosis Other abnormalities of gait and mobility (R26.89)    No Skilled PT Patient is modified independent with all activity/mobility   Co-evaluation                AMPAC 6 Clicks Help needed turning from your back to your side while in a flat bed without using bedrails?: None Help needed moving from lying on your back to sitting on the side of a flat bed without using bedrails?: None Help needed moving to and from a bed to a chair (including a wheelchair)?: None Help needed standing up from a chair using your arms (e.g., wheelchair or bedside chair)?: None Help needed to walk in hospital room?: None Help needed climbing 3-5 steps with a railing? : None 6 Click Score: 24      End of Session   Activity Tolerance: Patient tolerated treatment well  Patient left: in bed;with call bell/phone within reach   PT Visit Diagnosis: Other abnormalities of gait and mobility (R26.89)     Time: 1040-1049 PT Time Calculation (min) (ACUTE ONLY): 9 min  Charges:   PT Evaluation $PT Eval Low Complexity: 1 Low     [facility] PT Acute Rehabilitation Services Office [phone]   [facility]  01/18/2024, 10:58 AM

## 2024-01-18 NOTE — Discharge Instructions (Addendum)
 Dear Jacob Adams,   Thank you so much for allowing [place] to be part of your care!  You were admitted to [facility] for a kidney injury we believe occurred because of dehydration after you vomited repeatedly.  We gave you IV fluids and your kidneys improved.  Your potassium was also high, which we treated until it improved.  DISCHARGE MEDICATION CHANGES We paused your [place] Custard, and spironolactone because of your reduced kidney fraction until you follow up with your PCP at the [place].  POST-HOSPITAL & CARE INSTRUCTIONS Please make sure to drink at least 2 liters (4 large plastic water bottles) minimum daily Recommend discussing alcohol cessation with your doctor at [facility] tomorrow Please let PCP/Specialists know of any changes that were made.  Please see medications section of this packet for any medication changes.   DOCTOR'S APPOINTMENT & FOLLOW UP CARE INSTRUCTIONS  Future Appointments  Date Time Provider Department Center  03/15/2024  1:15 PM Cristie Hem, PA-C [facility] None  Please follow up with your PCP within 1 week; call and schedule an appointment.  RETURN PRECAUTIONS: Please seek medical attention if you are vomiting repeatedly, not making urine, or unable to drink for a prolonged period of time.  Take care and be well!  Family Medicine Teaching Service Inpatient Team [facility]  [address] [place], [place] [address] [phone]

## 2024-01-18 NOTE — Evaluation (Signed)
 Occupational Therapy Evaluation Patient Details Name: Jacob Adams MRN: [id number] DOB: [birth year] Today's Date: 01/18/2024   History of Present Illness   Pt is a 58 y/o M presenting to ED on 3/28 with chest and abdominal pain, admitted for AKI and hyperkalemia. PMH includes knee replacement, alcohol withdrawal, anxiety, HTN, pancreatitis, OSA, migraines     Clinical Impressions Pt reports ind at baseline with ADLs/functional mobility, lives with his children & grandchildren. Pt currently needing supervision for ADLs, ind with bed mobility and supervision for transfers without AD. Pt reports mild dizziness with ambulation, reaches for hall rail to stabilize. BP 126/93 (105) at end of session. Pt presenting with impairments listed below, will follow acutely. Anticipate no OT follow up needs at d/c.      If plan is discharge home, recommend the following:   A little help with walking and/or transfers;A little help with bathing/dressing/bathroom;Direct supervision/assist for medications management;Direct supervision/assist for financial management;Assist for transportation;Help with stairs or ramp for entrance     Functional Status Assessment   Patient has had a recent decline in their functional status and demonstrates the ability to make significant improvements in function in a reasonable and predictable amount of time.     Equipment Recommendations   None recommended by OT     Recommendations for Other Services         Precautions/Restrictions   Precautions Precaution/Restrictions Comments: watch bp     Mobility Bed Mobility Overal bed mobility: Independent                  Transfers Overall transfer level: Needs assistance   Transfers: Sit to/from Stand Sit to Stand: Supervision                  Balance Overall balance assessment: Mild deficits observed, not formally tested                                         ADL either  performed or assessed with clinical judgement   ADL Overall ADL's : Needs assistance/impaired Eating/Feeding: Set up;Sitting   Grooming: Set up;Sitting   Upper Body Bathing: Sitting;Supervision/ safety   Lower Body Bathing: Supervison/ safety;Sitting/lateral leans   Upper Body Dressing : Sitting;Supervision/safety   Lower Body Dressing: Supervision/safety;Sitting/lateral leans   Toilet Transfer: Supervision/safety;Ambulation;Regular Social worker and Hygiene: Supervision/safety   Tub/ Engineer, structural: Tub transfer;Supervision/safety   Functional mobility during ADLs: Supervision/safety       Vision Baseline Vision/History: 3 Glaucoma Additional Comments: WFL for BADL     Perception Perception: Not tested       Praxis Praxis: Not tested       Pertinent Vitals/Pain Pain Assessment Pain Assessment: No/denies pain     Extremity/Trunk Assessment Upper Extremity Assessment Upper Extremity Assessment: Generalized weakness;LUE deficits/detail LUE Deficits / Details: hx elbow surgery, limited shoulder ROM LUE Coordination: decreased gross motor   Lower Extremity Assessment Lower Extremity Assessment: Defer to PT evaluation   Cervical / Trunk Assessment Cervical / Trunk Assessment: Normal   Communication Communication Communication: No apparent difficulties   Cognition Arousal: Alert Behavior During Therapy: WFL for tasks assessed/performed               OT - Cognition Comments: pt not sure exactly the reson for hospital admission  Following commands: Intact       Cueing  General Comments   Cueing Techniques: Verbal cues  BP 126/93 at end of session   Exercises     Shoulder Instructions      Home Living Family/patient expects to be discharged to:: Private residence Living Arrangements: Children;Other relatives (children and grandkids) Available Help at Discharge: Family;Available 24  hours/day Type of Home: House Home Access: Stairs to enter [employer] of Steps: 1   Home Layout: One level;Laundry or work area in basement;Able to live on main level with bedroom/bathroom     Bathroom Shower/Tub: Tub/shower unit;Walk-in shower         Home Equipment: Other (comment) (CPAP)          Prior Functioning/Environment Prior Level of Function : Independent/Modified Independent;Driving             Mobility Comments: no AD use ADLs Comments: ind    OT Problem List: Decreased strength;Decreased range of motion;Decreased activity tolerance;Impaired balance (sitting and/or standing);Cardiopulmonary status limiting activity;Impaired UE functional use;Decreased cognition   OT Treatment/Interventions: Self-care/ADL training;Therapeutic exercise;Energy conservation;DME and/or AE instruction;Therapeutic activities;Patient/family education;Balance training      OT Goals(Current goals can be found in the care plan section)   Acute Rehab OT Goals Patient Stated Goal: none stated OT Goal Formulation: With patient Time For Goal Achievement: 02/01/24 Potential to Achieve Goals: Good ADL Goals Pt Will Perform Tub/Shower Transfer: Tub transfer;Shower transfer;Independently;ambulating Additional ADL Goal #1: pt will tolerate OOB activity x15 mins with BP stable in prep for ADLs   OT Frequency:  Min 1X/week    Co-evaluation              AM-PAC OT "6 Clicks" Daily Activity     Outcome Measure Help from another person eating meals?: None Help from another person taking care of personal grooming?: None Help from another person toileting, which includes using toliet, bedpan, or urinal?: A Little Help from another person bathing (including washing, rinsing, drying)?: A Little Help from another person to put on and taking off regular upper body clothing?: A Little Help from another person to put on and taking off regular lower body clothing?: A Little 6  Click Score: 20   End of Session Equipment Utilized During Treatment: Gait belt Nurse Communication: Mobility status  Activity Tolerance: Patient tolerated treatment well Patient left: in bed;with call bell/phone within reach  OT Visit Diagnosis: Other abnormalities of gait and mobility (R26.89);Unsteadiness on feet (R26.81);Muscle weakness (generalized) (M62.81)                Time: 1610-9604 OT Time Calculation (min): 17 min Charges:  OT General Charges $OT Visit: 1 Visit OT Evaluation $OT Eval Low Complexity: 1 Low  Carver Fila, OTD, OTR/L [employer] Preferred Acute Rehab [phone]   Carver Fila Koonce 01/18/2024, 8:41 AM

## 2024-01-18 NOTE — Discharge Summary (Addendum)
 Family Medicine Teaching [facility] Discharge Summary  Patient name: Jacob Adams Medical record number: [id number] Date of birth: Jul 12, 1966 Age: 58 y.o. Gender: male Date of Admission: 01/15/2024  Date of Discharge: 01/18/2024 Admitting Physician: Cyndia Skeeters, DO  Primary Care Provider: Center, [facility] Medical Consultants: None  Indication for Hospitalization: AKI  Discharge Diagnoses/Problem List:  Principal Problem for Admission: AKI Other Problems addressed during stay:  Active Problems:   Hyperkalemia   Chronic health problem   AKI (acute kidney injury) (HCC)   History of seizure due to alcohol withdrawal   Abdominal pain   Hypotension Alcohol use disorder HFrEF- not in exacerbation.  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  Acute on chronic epigastric pain 2/2 suspected gastritis Chronic issue, likely 2/2 alcohol use and gastritis.  Patient presented with 2 days of vomiting and epigastric pain, incidentally noted to have AKI and hyperkalemia as below.  Chest/Epigastric pain workup including EKG and troponin were unremarkable.  Pain self resolved with supportive care.  At time of discharge, patient was tolerating PO, and chest/epigastric pain resolved.  Patient already had outpatient follow-up with GI for EGD and colonoscopy planned around the week of April 2nd.  AKI  Hyperkalemia  Initial creatinine of 1.5, increased to 2.5 next morning.  Hyperkalemia present on admission, but resolved with Lokelma.  Ultrasound renal showed normal anatomy.  FENa supporting prerenal etiology of AKI.  UA showed glucosuria.  IVF started.  TTE obtained and showed improved EF 40-45% relative to prior in 2020.  Creatinine started trending down 3/30 and had decreased to 1.2, near baseline, at  time of discharge.  Patient tolerating PO fluids well and discharged in stable state.  Alcohol use disorder CIWAs monitored, remained consistently low, at 0 for >24 hours at time of discharge.  Did not require benzodiazepines this admission.  Patient interested in alcohol cessation, Naltrexone PO continued at discharge.  Other chronic conditions were medically managed with home medications and formulary alternatives as necessary (anxiety, depression, bipolar 1, HTN, T2DM, HLD).  Follow-up recommendations: Paused Farxiga for glycosuria and given AKI.  Paused Entresto and spironolactone given AKI.  Please repeat BMP and consider restarting meds if appropriate. Stopped oxycodone and flexeril at discharge, not clear why patient on these meds. Note that patient's quetiapine decreased to 200 mg nightly from 900 mg during recent psych hospitalization 3/17.  Continued on 200 mg dose at discharge. Follow up on alcohol cessation.  Naltrexone PO continued at discharge.  Disposition: Home  Discharge Condition: Stable, creatinine consistently downtrending  Discharge Exam:  Vitals:   01/18/24 0746 01/18/24 1153  BP: 106/77 (!) 131/94  Pulse: 91 88  Resp: 16 16  Temp: 98 F (36.7 C) (!) 100.9 F (38.3 C)  SpO2: 97% 97%   Physical Exam: General: Resting in bed, NAD.  Alert and at baseline. Eyes: No scleral icterus or injections. ENTM: MMM. Neck: No JVD. Cardiovascular: RRR.  No murmur/rub/gallops. Respiratory: No work of breathing on room air.  CTAB. Gastrointestinal: No abdominal tenderness to palpation.  Nondistended, normoactive bowel sounds. MSK: Ambulatory. Extremities: No peripheral edema bilaterally.  Cap refill less than 2 seconds. Neuro: A&O x 4. Psych: Pleasant, appropriate.  Significant Procedures: None  Significant Labs and Imaging:  Recent Labs  Lab 01/16/24 2304 01/17/24 0727  WBC  6.1 5.4  HGB 10.8* 11.7*  HCT 32.0* 35.5*  PLT 226 251   Recent Labs  Lab  01/16/24 1555 01/16/24 2304 01/17/24 0727 01/18/24 0619  NA 132* 131* 137 139  K 4.0 4.2 4.3 4.0  CL 100 102 102 104  CO2 24 25 25 25   GLUCOSE 106* 121* 126* 80  BUN 20 23* 20 16  CREATININE 2.72* 2.36* 1.90* 1.20  CALCIUM 8.6* 8.0* 8.8* 8.8*  ALKPHOS  --  60  --   --   AST  --  19  --   --   ALT  --  16  --   --   ALBUMIN  --  3.0*  --   --    - CXR: Unremarkable - CT head WO: Mild atrophic changes w/ out acute abnormality. - US renal: No hydronephrosis, benign 2.2 cm exophytic cyst for which no dedicated imaging follow-up is recommended. Prostate 28 mL. - TTE: Improved EF 40-45% relative to prior in 2020.  GIDD.  Borderline dilation of aortic root.  Results/Tests Pending at Time of Discharge: None  Discharge Medications:  Allergies as of 01/18/2024       Reactions   Nsaids Swelling, Rash, Other (See Comments)   Stomach pain   Motrin [ibuprofen] Nausea Only, Swelling, Other (See Comments)   Stomach pain   Pork (diagnostic) Other (See Comments)   Sunscreens Swelling, Other (See Comments)   Skin peels   Penicillins Itching, Swelling, Rash   [name] Other (See Comments)   Does not eat pork or pork byproducts, personal preference.        Medication List     PAUSE taking these medications    Entresto 49-51 MG Wait to take this until your doctor or other care provider tells you to start again. Generic drug: sacubitril-valsartan Take 1 tablet by mouth 2 (two) times daily.   Farxiga 10 MG Tabs tablet Wait to take this until your doctor or other care provider tells you to start again. Generic drug: dapagliflozin propanediol Take 10 mg by mouth daily.   spironolactone 25 MG tablet Wait to take this until your doctor or other care provider tells you to start again. Commonly known as: ALDACTONE Take 1 tablet (25 mg total) by mouth daily.       STOP taking these medications    cyclobenzaprine 10 MG tablet Commonly known as: FLEXERIL   Oxycodone  HCl 10 MG Tabs       TAKE these medications    albuterol 108 (90 Base) MCG/ACT inhaler Commonly known as: VENTOLIN HFA Inhale 2 puffs into the lungs every 6 (six) hours as needed for wheezing or shortness of breath.   atorvastatin 80 MG tablet Commonly known as: LIPITOR Take 1 tablet (80 mg total) by mouth at bedtime. What changed: when to take this   carvedilol 6.25 MG tablet Commonly known as: COREG Take 1 tablet (6.25 mg total) by mouth 2 (two) times daily.   CertaVite/Antioxidants Tabs Take 1 tablet by mouth daily.   DULoxetine 30 MG capsule Commonly known as: CYMBALTA Take 1 capsule (30 mg total) by mouth daily AND 2 capsules (60 mg total) at bedtime. What changed: See the new instructions.   Emgality 120 MG/ML Soaj Generic drug: Galcanezumab-gnlm Inject 120 mg into the skin See admin instructions. Inject 120mg  subcutaneously once a month, between the 1st and 3rd.   folic acid 1 MG tablet Commonly known as: FOLVITE Take 1 tablet (1 mg total) by mouth daily.  metFORMIN 500 MG tablet Commonly known as: GLUCOPHAGE Take 500 mg by mouth daily.   naltrexone 50 MG tablet Commonly known as: DEPADE Take 50 mg by mouth daily.   pantoprazole 40 MG tablet Commonly known as: Protonix Take 1 tablet (40 mg total) by mouth daily.   polyethylene glycol powder 17 GM/SCOOP powder Commonly known as: GLYCOLAX/MIRALAX Take 17 g by mouth daily as needed for mild constipation.   QUEtiapine 200 MG tablet Commonly known as: SEROQUEL Take 1 tablet (200 mg total) by mouth at bedtime. What changed: Another medication with the same name was removed. Continue taking this medication, and follow the directions you see here.   thiamine 100 MG tablet Commonly known as: VITAMIN B1 Take 1 tablet (100 mg total) by mouth daily.   Ubrelvy 50 MG Tabs Generic drug: Ubrogepant Take 1 tablet by mouth daily as needed.        Discharge Instructions: Please refer to Patient Instructions  section of EMR for full details.  Patient was counseled important signs and symptoms that should prompt return to medical care, changes in medications, dietary instructions, activity restrictions, and follow up appointments.   Follow-Up Appointments: Future Appointments  Date Time Provider Department Center  03/15/2024  1:15 PM Cristie Hem, PA-C [facility] None    Nelia Shi, MD 01/18/2024, 3:15 PM PGY-1, [facility]

## 2024-01-24 ENCOUNTER — Other Ambulatory Visit: Payer: Self-pay

## 2024-01-24 ENCOUNTER — Emergency Department (HOSPITAL_COMMUNITY)

## 2024-01-24 ENCOUNTER — Emergency Department (HOSPITAL_COMMUNITY)
Admission: EM | Admit: 2024-01-24 | Discharge: 2024-01-24 | Disposition: A | Discharge status: Home / Self Care | Attending: Emergency Medicine | Admitting: Emergency Medicine

## 2024-01-24 ENCOUNTER — Encounter (HOSPITAL_COMMUNITY): Payer: Self-pay

## 2024-01-24 DIAGNOSIS — R0789 Other chest pain: Secondary | ICD-10-CM | POA: Insufficient documentation

## 2024-01-24 DIAGNOSIS — I509 Heart failure, unspecified: Secondary | ICD-10-CM | POA: Insufficient documentation

## 2024-01-24 DIAGNOSIS — R079 Chest pain, unspecified: Secondary | ICD-10-CM | POA: Diagnosis present

## 2024-01-24 LAB — CBC WITH DIFFERENTIAL/PLATELET
Abs Immature Granulocytes: 0.01 10*3/uL (ref 0.00–0.07)
Basophils Absolute: 0 10*3/uL (ref 0.0–0.1)
Basophils Relative: 1 %
Eosinophils Absolute: 0 10*3/uL (ref 0.0–0.5)
Eosinophils Relative: 1 %
HCT: 39.9 % (ref 39.0–52.0)
Hemoglobin: 13 g/dL (ref 13.0–17.0)
Immature Granulocytes: 0 %
Lymphocytes Relative: 22 %
Lymphs Abs: 1 10*3/uL (ref 0.7–4.0)
MCH: 29.6 pg (ref 26.0–34.0)
MCHC: 32.6 g/dL (ref 30.0–36.0)
MCV: 90.9 fL (ref 80.0–100.0)
Monocytes Absolute: 0.8 10*3/uL (ref 0.1–1.0)
Monocytes Relative: 17 %
Neutro Abs: 2.8 10*3/uL (ref 1.7–7.7)
Neutrophils Relative %: 59 %
Platelets: 290 10*3/uL (ref 150–400)
RBC: 4.39 MIL/uL (ref 4.22–5.81)
RDW: 15.2 % (ref 11.5–15.5)
WBC: 4.7 10*3/uL (ref 4.0–10.5)
nRBC: 0 % (ref 0.0–0.2)

## 2024-01-24 LAB — COMPREHENSIVE METABOLIC PANEL WITH GFR
ALT: 30 U/L (ref 0–44)
AST: 35 U/L (ref 15–41)
Albumin: 4 g/dL (ref 3.5–5.0)
Alkaline Phosphatase: 68 U/L (ref 38–126)
Anion gap: 19 — ABNORMAL HIGH (ref 5–15)
BUN: 10 mg/dL (ref 6–20)
CO2: 15 mmol/L — ABNORMAL LOW (ref 22–32)
Calcium: 9 mg/dL (ref 8.9–10.3)
Chloride: 103 mmol/L (ref 98–111)
Creatinine, Ser: 1.04 mg/dL (ref 0.61–1.24)
GFR, Estimated: >60 mL/min (ref >60)
Glucose, Bld: 74 mg/dL (ref 70–99)
Potassium: 4.4 mmol/L (ref 3.5–5.1)
Sodium: 137 mmol/L (ref 135–145)
Total Bilirubin: 0.5 mg/dL (ref 0.0–1.2)
Total Protein: 7.2 g/dL (ref 6.5–8.1)

## 2024-01-24 LAB — TROPONIN I (HIGH SENSITIVITY): Troponin I (High Sensitivity): 11 ng/L (ref <18)

## 2024-01-24 LAB — LIPASE, BLOOD: Lipase: 44 U/L (ref 11–51)

## 2024-01-24 LAB — ETHANOL: Alcohol, Ethyl (B): 81 mg/dL — ABNORMAL HIGH (ref <10)

## 2024-01-24 MED ORDER — ALUM & MAG HYDROXIDE-SIMETH 200-200-20 MG/5ML PO SUSP
30.0000 mL | Freq: Once | ORAL | Status: AC
  Administered 2024-01-24: 30 mL via ORAL
  Filled 2024-01-24: qty 30

## 2024-01-24 NOTE — ED Triage Notes (Signed)
 Patient bib PTAR from home with multiple complaints. Patient has complaints of bilateral knee pain, and chest pain, and has also been drinking alcohol today.   EMS reports that patients past medical history includes CHF and A-fib and he has drank 1 and a half 12 ounce beers.

## 2024-01-24 NOTE — Discharge Instructions (Signed)
Follow up with your family doc in the office.  

## 2024-01-24 NOTE — ED Provider Notes (Signed)
 [place] EMERGENCY DEPARTMENT AT [facility] Provider Note   CSN: [id number] Arrival date & time: 01/24/24  1624     History  Chief Complaint  Patient presents with   Alcohol Intoxication   Chest Pain   Knee Pain    Jacob Adams is a 58 y.o. male.  58 yo M with a chief complaints of chest pain abdominal pain and bilateral knee pain.  He tells me that his knees have been hurting him for quite some time he was supposed to have surgery performed on them.  He is also complaining of chest pain.  He describes it as a burning pain seems to be worse with deep breathing twisting.  Been going on for about 6 hours or so.  He also has diffuse abdominal burning.  This been going on for a while.  Is not sure what seems to make that better or worse.  No vomiting no fevers.   Alcohol Intoxication Associated symptoms include chest pain.  Chest Pain Knee Pain      Home Medications Prior to Admission medications   Medication Sig Start Date End Date Taking? Authorizing Provider  albuterol (VENTOLIN HFA) 108 (90 Base) MCG/ACT inhaler Inhale 2 puffs into the lungs every 6 (six) hours as needed for wheezing or shortness of breath.    [provider]  atorvastatin (LIPITOR) 80 MG tablet Take 1 tablet (80 mg total) by mouth at bedtime. Patient taking differently: Take 80 mg by mouth daily. 01/06/24   Lewanda Rife, MD  carvedilol (COREG) 6.25 MG tablet Take 1 tablet (6.25 mg total) by mouth 2 (two) times daily. 01/06/24   Lewanda Rife, MD  DULoxetine (CYMBALTA) 30 MG capsule Take 1 capsule (30 mg total) by mouth daily AND 2 capsules (60 mg total) at bedtime. Patient taking differently: Take 1 capsule (30mg ) by mouth twice daily. 01/06/24 02/05/24  Lewanda Rife, MD  FARXIGA 10 MG TABS tablet Take 10 mg by mouth daily. 11/24/23   [provider]  folic acid (FOLVITE) 1 MG tablet Take 1 tablet (1 mg total) by mouth daily. 01/18/24   Tiffany Kocher, DO   Galcanezumab-gnlm (EMGALITY) 120 MG/ML SOAJ Inject 120 mg into the skin See admin instructions. Inject 120mg  subcutaneously once a month, between the 1st and 3rd.    [provider]  metFORMIN (GLUCOPHAGE) 500 MG tablet Take 500 mg by mouth daily.    [provider]  Multiple Vitamin (MULTIVITAMIN WITH MINERALS) TABS tablet Take 1 tablet by mouth daily. 01/18/24   Tiffany Kocher, DO  naltrexone (DEPADE) 50 MG tablet Take 50 mg by mouth daily.    [provider]  pantoprazole (PROTONIX) 40 MG tablet Take 1 tablet (40 mg total) by mouth daily. 01/02/24 03/02/24  Meredeth Ide, MD  polyethylene glycol powder (GLYCOLAX/MIRALAX) 17 GM/SCOOP powder Take 17 g by mouth daily as needed for mild constipation. 01/18/24   Tiffany Kocher, DO  QUEtiapine (SEROQUEL) 200 MG tablet Take 1 tablet (200 mg total) by mouth at bedtime. Patient not taking: Reported on 01/15/2024 01/06/24   Lewanda Rife, MD  sacubitril-valsartan (ENTRESTO) 49-51 MG Take 1 tablet by mouth 2 (two) times daily. 01/07/24   Lewanda Rife, MD  spironolactone (ALDACTONE) 25 MG tablet Take 1 tablet (25 mg total) by mouth daily. 01/07/24   Lewanda Rife, MD  thiamine (VITAMIN B1) 100 MG tablet Take 1 tablet (100 mg total) by mouth daily. 01/18/24   Tiffany Kocher, DO  Ubrogepant (UBRELVY) 50 MG TABS  Take 1 tablet by mouth daily as needed.    [provider]      Allergies    Nsaids, Motrin [ibuprofen], Pork (diagnostic), Sunscreens, Penicillins, and [name]    Review of Systems   Review of Systems  Cardiovascular:  Positive for chest pain.    Physical Exam Updated Vital Signs BP (!) 145/98   Pulse 95   Temp 98.3 F (36.8 C) (Oral)   Resp 19   Ht 5\' 10"  (1.778 m)   Wt 108.9 kg   SpO2 100%   BMI 34.44 kg/m  Physical Exam Vitals and nursing note reviewed.  Constitutional:      Appearance: He is well-developed.  HENT:     Head: Normocephalic and atraumatic.  Eyes:      Pupils: Pupils are equal, round, and reactive to light.  Neck:     Vascular: No JVD.  Cardiovascular:     Rate and Rhythm: Normal rate and regular rhythm.     Heart sounds: No murmur heard.    No friction rub. No gallop.  Pulmonary:     Effort: No respiratory distress.     Breath sounds: No wheezing.  Chest:     Chest wall: Tenderness present.     Comments: Pain with palpation of the lower sternal border reproduces his chest discomfort. Abdominal:     General: There is no distension.     Tenderness: There is no abdominal tenderness. There is no guarding or rebound.  Musculoskeletal:        General: Normal range of motion.     Cervical back: Normal range of motion and neck supple.  Skin:    Coloration: Skin is not pale.     Findings: No rash.  Neurological:     Mental Status: He is alert and oriented to person, place, and time.  Psychiatric:        Behavior: Behavior normal.     ED Results / Procedures / Treatments   Labs (all labs ordered are listed, but only abnormal results are displayed) Labs Reviewed  COMPREHENSIVE METABOLIC PANEL WITH GFR - Abnormal; Notable for the following components:      Result Value   CO2 15 (*)    Anion gap 19 (*)    All other components within normal limits  ETHANOL - Abnormal; Notable for the following components:   Alcohol, Ethyl (B) 81 (*)    All other components within normal limits  CBC WITH DIFFERENTIAL/PLATELET  LIPASE, BLOOD  TROPONIN I (HIGH SENSITIVITY)    EKG EKG Interpretation Date/Time:  Sunday January 24 2024 16:41:25 EDT Ventricular Rate:  91 PR Interval:  185 QRS Duration:  91 QT Interval:  371 QTC Calculation: 457 R Axis:   -53  Text Interpretation: Sinus rhythm Multiple ventricular premature complexes Left anterior fascicular block No significant change since last tracing Confirmed by Melene Plan [phone]) on 01/24/2024 5:29:47 PM  Radiology DG Chest Port 1 View Result Date: 01/24/2024 CLINICAL DATA:  Chest pain.  EXAM: PORTABLE CHEST 1 VIEW COMPARISON:  01/15/2024 FINDINGS: The cardiomediastinal contours are normal. Minor left lung base atelectasis. Pulmonary vasculature is normal. No consolidation, pleural effusion, or pneumothorax. No acute osseous abnormalities are seen. IMPRESSION: Minor left lung base atelectasis. Electronically Signed   By: Narda Rutherford M.D.   On: 01/24/2024 18:12    Procedures Procedures    Medications Ordered in ED Medications  alum & mag hydroxide-simeth (MAALOX/MYLANTA) 200-200-20 MG/5ML suspension 30 mL (30 mLs Oral Given 01/24/24 1651)  ED Course/ Medical Decision Making/ A&P                                 Medical Decision Making Amount and/or Complexity of Data Reviewed Labs: ordered. Radiology: ordered.  Risk OTC drugs.   58 yo M with a chief complaints of chest pain abdominal pain and bilateral knee pain.  The knee pain has been going on for years.  He does not think anything is changed with that.  Chest pain started earlier today.  Worse with deep breathing twisting turning reproduced on exam.  Abdominal pain diffuse about the belly.  Nothing seems to make it better or worse.  Going on for some time now.  Will obtain a laboratory evaluation.  GI cocktail chest x-ray.  Troponin negative, no anemia, no significant electrolyte abnormalities with the exception that the patient's bicarb is 15.  Anion gap as well.  His alcohol level is only mildly elevated at 81.  Will discharge patient home.  PCP follow-up.  6:28 PM:  I have discussed the diagnosis/risks/treatment options with the patient.  Evaluation and diagnostic testing in the emergency department does not suggest an emergent condition requiring admission or immediate intervention beyond what has been performed at this time.  They will follow up with PCP. We also discussed returning to the ED immediately if new or worsening sx occur. We discussed the sx which are most concerning (e.g., sudden worsening  pain, fever, inability to tolerate by mouth) that necessitate immediate return. Medications administered to the patient during their visit and any new prescriptions provided to the patient are listed below.  Medications given during this visit Medications  alum & mag hydroxide-simeth (MAALOX/MYLANTA) 200-200-20 MG/5ML suspension 30 mL (30 mLs Oral Given 01/24/24 1651)     The patient appears reasonably screen and/or stabilized for discharge and I doubt any other medical condition or other [facility] requiring further screening, evaluation, or treatment in the ED at this time prior to discharge.          Final Clinical Impression(s) / ED Diagnoses Final diagnoses:  Nonspecific chest pain    Rx / DC Orders ED Discharge Orders     None         Melene Plan, DO 01/24/24 1828

## 2024-02-03 ENCOUNTER — Encounter (HOSPITAL_COMMUNITY): Payer: Self-pay

## 2024-02-03 ENCOUNTER — Emergency Department (HOSPITAL_COMMUNITY)

## 2024-02-03 ENCOUNTER — Other Ambulatory Visit: Payer: Self-pay

## 2024-02-03 ENCOUNTER — Emergency Department (HOSPITAL_COMMUNITY): Admission: EM | Admit: 2024-02-03 | Discharge: 2024-02-03 | Disposition: A | Discharge status: Home / Self Care

## 2024-02-03 DIAGNOSIS — R600 Localized edema: Secondary | ICD-10-CM | POA: Insufficient documentation

## 2024-02-03 DIAGNOSIS — Z79899 Other long term (current) drug therapy: Secondary | ICD-10-CM | POA: Insufficient documentation

## 2024-02-03 DIAGNOSIS — I509 Heart failure, unspecified: Secondary | ICD-10-CM | POA: Insufficient documentation

## 2024-02-03 DIAGNOSIS — B349 Viral infection, unspecified: Secondary | ICD-10-CM

## 2024-02-03 DIAGNOSIS — I110 Hypertensive heart disease with heart failure: Secondary | ICD-10-CM | POA: Insufficient documentation

## 2024-02-03 DIAGNOSIS — R531 Weakness: Secondary | ICD-10-CM | POA: Diagnosis present

## 2024-02-03 LAB — COMPREHENSIVE METABOLIC PANEL WITH GFR
ALT: 17 U/L (ref 0–44)
AST: 21 U/L (ref 15–41)
Albumin: 4 g/dL (ref 3.5–5.0)
Alkaline Phosphatase: 64 U/L (ref 38–126)
Anion gap: 9 (ref 5–15)
BUN: 11 mg/dL (ref 6–20)
CO2: 24 mmol/L (ref 22–32)
Calcium: 9.4 mg/dL (ref 8.9–10.3)
Chloride: 104 mmol/L (ref 98–111)
Creatinine, Ser: 0.89 mg/dL (ref 0.61–1.24)
GFR, Estimated: >60 mL/min (ref >60)
Glucose, Bld: 107 mg/dL — ABNORMAL HIGH (ref 70–99)
Potassium: 4.5 mmol/L (ref 3.5–5.1)
Sodium: 137 mmol/L (ref 135–145)
Total Bilirubin: 0.7 mg/dL (ref 0.0–1.2)
Total Protein: 7.7 g/dL (ref 6.5–8.1)

## 2024-02-03 LAB — RESP PANEL BY RT-PCR (RSV, FLU A&B, COVID)  RVPGX2
Influenza A by PCR: NEGATIVE
Influenza B by PCR: NEGATIVE
Resp Syncytial Virus by PCR: NEGATIVE
SARS Coronavirus 2 by RT PCR: NEGATIVE

## 2024-02-03 LAB — TROPONIN I (HIGH SENSITIVITY)
Troponin I (High Sensitivity): 7 ng/L (ref <18)
Troponin I (High Sensitivity): 6 ng/L (ref <18)

## 2024-02-03 LAB — CBC
HCT: 36.9 % — ABNORMAL LOW (ref 39.0–52.0)
Hemoglobin: 12.3 g/dL — ABNORMAL LOW (ref 13.0–17.0)
MCH: 30.1 pg (ref 26.0–34.0)
MCHC: 33.3 g/dL (ref 30.0–36.0)
MCV: 90.4 fL (ref 80.0–100.0)
Platelets: 242 10*3/uL (ref 150–400)
RBC: 4.08 MIL/uL — ABNORMAL LOW (ref 4.22–5.81)
RDW: 16.4 % — ABNORMAL HIGH (ref 11.5–15.5)
WBC: 5.7 10*3/uL (ref 4.0–10.5)
nRBC: 0 % (ref 0.0–0.2)

## 2024-02-03 LAB — BRAIN NATRIURETIC PEPTIDE: B Natriuretic Peptide: 42.1 pg/mL (ref 0.0–100.0)

## 2024-02-03 LAB — D-DIMER, QUANTITATIVE: D-Dimer, Quant: <0.27 ug{FEU}/mL (ref 0.00–0.50)

## 2024-02-03 LAB — CBG MONITORING, ED: Glucose-Capillary: 97 mg/dL (ref 70–99)

## 2024-02-03 MED ORDER — THIAMINE MONONITRATE 100 MG PO TABS: 100.0000 mg | ORAL_TABLET | Freq: Every day | ORAL | Status: DC

## 2024-02-03 MED ORDER — LORAZEPAM 1 MG PO TABS: 0.0000 mg | ORAL_TABLET | Freq: Two times a day (BID) | ORAL | Status: DC

## 2024-02-03 MED ORDER — LORAZEPAM 2 MG/ML IJ SOLN
0.0000 mg | Freq: Four times a day (QID) | INTRAMUSCULAR | Status: DC
  Administered 2024-02-03: 1 mg via INTRAVENOUS
  Filled 2024-02-03: qty 1

## 2024-02-03 MED ORDER — LORAZEPAM 1 MG PO TABS: 0.0000 mg | ORAL_TABLET | Freq: Four times a day (QID) | ORAL | Status: DC

## 2024-02-03 MED ORDER — LORAZEPAM 2 MG/ML IJ SOLN: 0.0000 mg | Freq: Two times a day (BID) | INTRAMUSCULAR | Status: DC

## 2024-02-03 MED ORDER — THIAMINE HCL 100 MG/ML IJ SOLN: 100.0000 mg | Freq: Every day | INTRAMUSCULAR | Status: DC

## 2024-02-03 NOTE — ED Provider Notes (Signed)
 [place] EMERGENCY DEPARTMENT AT [facility] Provider Note   CSN: [id number] Arrival date & time: 02/03/24  1107     History  Chief Complaint  Patient presents with  . Dizziness  . Weakness    Jacob Adams is a 58 y.o. male.  This is a 58 year old male presenting emergency department for generalized malaise, headache, lightheadedness, shortness of breath and generalized weakness.  Reports usual state of health yesterday, this morning however he noted symptoms.  Feels somewhat shaky and lightheaded like he may pass out..  Last used alcohol last night.   Dizziness Associated symptoms: weakness   Weakness Associated symptoms: dizziness        Home Medications Prior to Admission medications   Medication Sig Start Date End Date Taking? Authorizing Provider  albuterol (VENTOLIN HFA) 108 (90 Base) MCG/ACT inhaler Inhale 2 puffs into the lungs every 6 (six) hours as needed for wheezing or shortness of breath.    [provider]  atorvastatin (LIPITOR) 80 MG tablet Take 1 tablet (80 mg total) by mouth at bedtime. Patient taking differently: Take 80 mg by mouth daily. 01/06/24   Lewanda Rife, MD  carvedilol (COREG) 6.25 MG tablet Take 1 tablet (6.25 mg total) by mouth 2 (two) times daily. 01/06/24   Lewanda Rife, MD  DULoxetine (CYMBALTA) 30 MG capsule Take 1 capsule (30 mg total) by mouth daily AND 2 capsules (60 mg total) at bedtime. Patient taking differently: Take 1 capsule (30mg ) by mouth twice daily. 01/06/24 02/05/24  Lewanda Rife, MD  FARXIGA 10 MG TABS tablet Take 10 mg by mouth daily. 11/24/23   [provider]  folic acid (FOLVITE) 1 MG tablet Take 1 tablet (1 mg total) by mouth daily. 01/18/24   Tiffany Kocher, DO  Galcanezumab-gnlm (EMGALITY) 120 MG/ML SOAJ Inject 120 mg into the skin See admin instructions. Inject 120mg  subcutaneously once a month, between the 1st and 3rd.    [provider]  metFORMIN (GLUCOPHAGE) 500 MG  tablet Take 500 mg by mouth daily.    [provider]  Multiple Vitamin (MULTIVITAMIN WITH MINERALS) TABS tablet Take 1 tablet by mouth daily. 01/18/24   Tiffany Kocher, DO  naltrexone (DEPADE) 50 MG tablet Take 50 mg by mouth daily.    [provider]  pantoprazole (PROTONIX) 40 MG tablet Take 1 tablet (40 mg total) by mouth daily. 01/02/24 03/02/24  Meredeth Ide, MD  polyethylene glycol powder (GLYCOLAX/MIRALAX) 17 GM/SCOOP powder Take 17 g by mouth daily as needed for mild constipation. 01/18/24   Tiffany Kocher, DO  QUEtiapine (SEROQUEL) 200 MG tablet Take 1 tablet (200 mg total) by mouth at bedtime. Patient not taking: Reported on 01/15/2024 01/06/24   Lewanda Rife, MD  sacubitril-valsartan (ENTRESTO) 49-51 MG Take 1 tablet by mouth 2 (two) times daily. 01/07/24   Lewanda Rife, MD  spironolactone (ALDACTONE) 25 MG tablet Take 1 tablet (25 mg total) by mouth daily. 01/07/24   Lewanda Rife, MD  thiamine (VITAMIN B1) 100 MG tablet Take 1 tablet (100 mg total) by mouth daily. 01/18/24   Tiffany Kocher, DO  Ubrogepant (UBRELVY) 50 MG TABS Take 1 tablet by mouth daily as needed.    [provider]      Allergies    Nsaids, Motrin [ibuprofen], Pork (diagnostic), Sunscreens, Penicillins, and [name]    Review of Systems   Review of Systems  Neurological:  Positive for dizziness and weakness.    Physical Exam Updated Vital Signs BP (!) 152/99  Pulse 88   Temp 98.4 F (36.9 C) (Oral)   Resp 18   Ht 5\' 10"  (1.778 m)   Wt 108 kg   SpO2 100%   BMI 34.16 kg/m  Physical Exam Vitals and nursing note reviewed.  HENT:     Head: Normocephalic and atraumatic.     Nose: Nose normal.  Eyes:     Conjunctiva/sclera: Conjunctivae normal.  Cardiovascular:     Rate and Rhythm: Normal rate and regular rhythm.  Pulmonary:     Effort: Pulmonary effort is normal.     Breath sounds: Normal breath sounds.  Abdominal:     General: Abdomen is flat.  There is no distension.     Tenderness: There is no abdominal tenderness. There is no guarding or rebound.  Musculoskeletal:     Right lower leg: Edema present.     Left lower leg: Edema present.  Skin:    General: Skin is warm.     Capillary Refill: Capillary refill takes less than 2 seconds.  Neurological:     General: No focal deficit present.     Mental Status: He is alert.  Psychiatric:        Mood and Affect: Mood normal.        Behavior: Behavior normal.     ED Results / Procedures / Treatments   Labs (all labs ordered are listed, but only abnormal results are displayed) Labs Reviewed  RESP PANEL BY RT-PCR (RSV, FLU A&B, COVID)  [id number]  CBC  COMPREHENSIVE METABOLIC PANEL WITH GFR  BRAIN NATRIURETIC PEPTIDE  D-DIMER, QUANTITATIVE  CBG MONITORING, ED  TROPONIN I (HIGH SENSITIVITY)    EKG EKG Interpretation Date/Time:  Wednesday February 03 2024 11:15:21 EDT Ventricular Rate:  78 PR Interval:  187 QRS Duration:  91 QT Interval:  367 QTC Calculation: 418 R Axis:   -8  Text Interpretation: Sinus rhythm Multiple premature complexes, vent & supraven Confirmed by Elise Guile [phone]) on 02/03/2024 11:28:05 AM  Radiology No results found.  Procedures Procedures    Medications Ordered in ED Medications  LORazepam (ATIVAN) injection 0-4 mg (has no administration in time range)    Or  LORazepam (ATIVAN) tablet 0-4 mg (has no administration in time range)  LORazepam (ATIVAN) injection 0-4 mg (has no administration in time range)    Or  LORazepam (ATIVAN) tablet 0-4 mg (has no administration in time range)    ED Course/ Medical Decision Making/ A&P Clinical Course as of 02/03/24 1546  Wed Feb 03, 2024  1128 Echo 01/17/24: "IMPRESSIONS     1. Left ventricular ejection fraction, by estimation, is 40 to 45%. The  left ventricle has mildly decreased function. The left ventricle  demonstrates global hypokinesis. Left ventricular diastolic parameters are  consistent  with Grade I diastolic  dysfunction (impaired relaxation).   2. Right ventricular systolic function is normal. The right ventricular  size is normal. Tricuspid regurgitation signal is inadequate for assessing  PA pressure.   3. The mitral valve is normal in structure. No evidence of mitral valve  regurgitation. No evidence of mitral stenosis.   4. The aortic valve is tricuspid. Aortic valve regurgitation is not  visualized. No aortic stenosis is present.   5. There is borderline dilatation of the aortic root, measuring 39 mm.  There is borderline dilatation of the ascending aorta, measuring 40 mm.   6. The inferior vena cava is normal in size with greater than 50%  respiratory variability, suggesting right atrial pressure of 3  mmHg.  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  No anemia [TY]  1327 D-Dimer, Quant: <0.27 PE unlikely given negative D-dimer [TY]  1327 DG Chest Portable 1 View Largely unchanged from April 6 per my independent review [TY]  1349 Troponin I (High Sensitivity): 7 Cardiac etiology less likely.  [TY]  1350 B Natriuretic Peptide: 42.1 Acute decompensated heart failure unlikely [TY]  1527 Patient feeling improved.  Awaiting delta troponin and chest x-ray. [TY]    Clinical Course User Index [TY] Rolinda Climes, DO                                 Medical Decision Making This is a 58 year old male presenting emergency department for generalized malaise, lightheadedness and shortness of breath.  Complex past medical history to include alcohol use, heart failure, hypertension, pancreatitis.  Recent hospital admission; see ED course.  Will get screening labs, to include cardiac markers.  Symptoms possible viral;  will get flu/COVID.  Chest x-ray to evaluate for pneumonia/pulmonary edema.  Recent hospitalization, concern for possibly PE given his complaint of shortness of breath, will get D-dimer.  Overall he does not appear to be in acute distress, reassuring vitals.  See ED course for further MDM and further disposition.  Amount and/or Complexity of Data Reviewed Labs: ordered. Radiology: ordered.  Risk Prescription drug management.          Final Clinical Impression(s) / ED Diagnoses Final diagnoses:  None    Rx / DC Orders ED Discharge Orders     None         Rolinda Climes, DO 02/03/24 1546

## 2024-02-03 NOTE — ED Notes (Signed)
 Unsuccessful IV attempt x2.

## 2024-02-03 NOTE — ED Provider Notes (Signed)
  Physical Exam  BP (!) 144/91   Pulse 93   Temp 98.2 F (36.8 C)   Resp 14   Ht 5\' 10"  (1.778 m)   Wt 108 kg   SpO2 100%   BMI 34.16 kg/m   Physical Exam Vitals and nursing note reviewed.  HENT:     Head: Normocephalic and atraumatic.  Eyes:     Pupils: Pupils are equal, round, and reactive to light.  Cardiovascular:     Rate and Rhythm: Normal rate and regular rhythm.  Pulmonary:     Effort: Pulmonary effort is normal.     Breath sounds: Normal breath sounds.  Abdominal:     Palpations: Abdomen is soft.     Tenderness: There is no abdominal tenderness.  Skin:    General: Skin is warm and dry.  Neurological:     Mental Status: He is alert.  Psychiatric:        Mood and Affect: Mood normal.     Procedures  Procedures  ED Course / MDM   Clinical Course as of 02/03/24 1750  Wed Feb 03, 2024  1128 Echo 01/17/24: "IMPRESSIONS     1. Left ventricular ejection fraction, by estimation, is 40 to 45%. The  left ventricle has mildly decreased function. The left ventricle  demonstrates global hypokinesis. Left ventricular diastolic parameters are  consistent with Grade I diastolic  dysfunction (impaired relaxation).   2. Right ventricular systolic function is normal. The right ventricular  size is normal. Tricuspid regurgitation signal is inadequate for assessing  PA pressure.   3. The mitral valve is normal in structure. No evidence of mitral valve  regurgitation. No evidence of mitral stenosis.   4. The aortic valve is tricuspid. Aortic valve regurgitation is not  visualized. No aortic stenosis is present.   5. There is borderline dilatation of the aortic root, measuring 39 mm.  There is borderline dilatation of the ascending aorta, measuring 40 mm.   6. The inferior vena cava is normal in size with greater than 50%  respiratory variability, suggesting right atrial pressure of 3 mmHg.  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  No anemia [TY]  1327 D-Dimer, Quant: <0.27 PE unlikely given negative D-dimer [TY]  1327 DG Chest Portable 1 View Largely unchanged from April 6 per my independent review [TY]  1349 Troponin I (High Sensitivity): 7 Cardiac etiology less likely.  [TY]  1350 B Natriuretic Peptide: 42.1 Acute decompensated heart failure unlikely [TY]  1527 Patient feeling improved.  Awaiting delta troponin and chest x-ray. [TY]  1750 Chest x-ray NAD.  Delta troponin flat.  Suspicion for ACS.  Patient feeling much better.  Stable for discharge at this time [MP]    Clinical Course User Index [MP] Royanne Foots, DO [TY] Coral Spikes, DO   Medical Decision Making I, Estelle June DO, have assumed care of this patient from the previous provider pending chest x-ray delta troponin reassessment and disposition  Amount and/or Complexity of Data Reviewed Labs: ordered. Decision-making details documented in ED Course. Radiology: ordered. Decision-making details documented in ED Course.  Risk Prescription drug management.          Royanne Foots, DO 02/03/24 1750

## 2024-02-03 NOTE — ED Notes (Signed)
 Pt using urinal.

## 2024-02-03 NOTE — ED Triage Notes (Signed)
 Pt arrived reporting he has been dizzy and weak. States has been going on for a while. Denies any other complaints. Denies any pain.

## 2024-02-03 NOTE — Discharge Instructions (Signed)
 Please follow-up with your primary doctor.  Return if felt fevers, chills, worsening headache, worsening chest pain, shortness of breath, passout, palpitations or inability eat or drink due to nausea vomiting or any new or worsening symptoms that are concerning to you.

## 2024-02-16 NOTE — Pre-Procedure Instructions (Signed)
 Surgical Instructions   Your procedure is scheduled on Monday, May 12th. Report to [facility] Main Entrance "A" at 08:30 A.M., then check in with the Admitting office. Any questions or running late day of surgery: call [phone]  Questions prior to your surgery date: call [phone], Monday-Friday, 8am-4pm. If you experience any cold or flu symptoms such as cough, fever, chills, shortness of breath, etc. between now and your scheduled surgery, please notify us  at the above number.     Remember:  Do not eat after midnight the night before your surgery  You may drink clear liquids until 08:00 the morning of your surgery.   Clear liquids allowed are: Water, Non-Citrus Juices (without pulp), Carbonated Beverages, Clear Tea (no milk, honey, etc.), Black Coffee Only (NO MILK, CREAM OR POWDERED CREAMER of any kind), and Gatorade.  Patient Instructions  The night before surgery:  No food after midnight. ONLY clear liquids after midnight   The day of surgery (if you have diabetes): Drink ONE (1) 12 oz G2 given to you in your pre admission testing appointment by 08:00 AM the morning of surgery. Drink in one sitting. Do not sip.  This drink was given to you during your hospital  pre-op appointment visit.  Nothing else to drink after completing the  12 oz bottle of G2.         If you have questions, please contact your surgeon's office.    Take these medicines the morning of surgery with A SIP OF WATER  atorvastatin  (LIPITOR)  carvedilol  (COREG )  DULoxetine  (CYMBALTA )     May take these medicines IF NEEDED: albuterol  (VENTOLIN  HFA)- bring inhaler with you on day of surgery Ubrogepant  (UBRELVY )   One week prior to surgery, STOP taking any Aspirin  (unless otherwise instructed by your surgeon) Aleve , Naproxen , Ibuprofen , Motrin , Advil , Goody's, BC's, all herbal medications, fish oil, and non-prescription vitamins.   WHAT DO I DO ABOUT MY DIABETES MEDICATION?   Do not take  metFORMIN (GLUCOPHAGE) the morning of surgery.  STOP taking FARXIGA  3 days prior to surgery. Last dose 5/8.      HOW TO MANAGE YOUR DIABETES BEFORE AND AFTER SURGERY  Why is it important to control my blood sugar before and after surgery? Improving blood sugar levels before and after surgery helps healing and can limit problems. A way of improving blood sugar control is eating a healthy diet by:  Eating less sugar and carbohydrates  Increasing activity/exercise  Talking with your doctor about reaching your blood sugar goals High blood sugars (greater than 180 mg/dL) can raise your risk of infections and slow your recovery, so you will need to focus on controlling your diabetes during the weeks before surgery. Make sure that the doctor who takes care of your diabetes knows about your planned surgery including the date and location.  How do I manage my blood sugar before surgery? Check your blood sugar at least 4 times a day, starting 2 days before surgery, to make sure that the level is not too high or low.  Check your blood sugar the morning of your surgery when you wake up and every 2 hours until you get to the Short Stay unit.  If your blood sugar is less than 70 mg/dL, you will need to treat for low blood sugar: Do not take insulin. Treat a low blood sugar (less than 70 mg/dL) with  cup of clear juice (cranberry or apple), 4 glucose tablets, OR glucose gel. Recheck blood sugar in 15  minutes after treatment (to make sure it is greater than 70 mg/dL). If your blood sugar is not greater than 70 mg/dL on recheck, call [phone] for further instructions. Report your blood sugar to the short stay nurse when you get to Short Stay.  If you are admitted to the hospital after surgery: Your blood sugar will be checked by the staff and you will probably be given insulin after surgery (instead of oral diabetes medicines) to make sure you have good blood sugar levels. The goal for blood sugar  control after surgery is 80-180 mg/dL.                    Do NOT Smoke (Tobacco/Vaping) for 24 hours prior to your procedure.  If you use a CPAP at night, you may bring your mask/headgear for your overnight stay.   You will be asked to remove any contacts, glasses, piercing's, hearing aid's, dentures/partials prior to surgery. Please bring cases for these items if needed.    Patients discharged the day of surgery will not be allowed to drive home, and someone needs to stay with them for 24 hours.  SURGICAL WAITING ROOM VISITATION Patients may have no more than 2 support people in the waiting area - these visitors may rotate.   Pre-op nurse will coordinate an appropriate time for 1 ADULT support person, who may not rotate, to accompany patient in pre-op.  Children under the age of 26 must have an adult with them who is not the patient and must remain in the main waiting area with an adult.  If the patient needs to stay at the hospital during part of their recovery, the visitor guidelines for inpatient rooms apply.  Please refer to the [facility] website for the visitor guidelines for any additional information.   If you received a COVID test during your pre-op visit  it is requested that you wear a mask when out in public, stay away from anyone that may not be feeling well and notify your surgeon if you develop symptoms. If you have been in contact with anyone that has tested positive in the last 10 days please notify you surgeon.      Pre-operative 5 CHG Bathing Instructions   You can play a key role in reducing the risk of infection after surgery. Your skin needs to be as free of germs as possible. You can reduce the number of germs on your skin by washing with CHG (chlorhexidine gluconate) soap before surgery. CHG is an antiseptic soap that kills germs and continues to kill germs even after washing.   DO NOT use if you have an allergy to chlorhexidine/CHG or antibacterial soaps. If  your skin becomes reddened or irritated, stop using the CHG and notify one of our RNs at [phone].   Please shower with the CHG soap starting 4 days before surgery using the following schedule:     Please keep in mind the following:  DO NOT shave, including legs and underarms, starting the day of your first shower.   You may shave your face at any point before/day of surgery.  Place clean sheets on your bed the day you start using CHG soap. Use a clean washcloth (not used since being washed) for each shower. DO NOT sleep with pets once you start using the CHG.   CHG Shower Instructions:  Wash your face and private area with normal soap. If you choose to wash your hair, wash first with your normal  shampoo.  After you use shampoo/soap, rinse your hair and body thoroughly to remove shampoo/soap residue.  Turn the water OFF and apply about 3 tablespoons (45 ml) of CHG soap to a CLEAN washcloth.  Apply CHG soap ONLY FROM YOUR NECK DOWN TO YOUR TOES (washing for 3-5 minutes)  DO NOT use CHG soap on face, private areas, open wounds, or sores.  Pay special attention to the area where your surgery is being performed.  If you are having back surgery, having someone wash your back for you may be helpful. Wait 2 minutes after CHG soap is applied, then you may rinse off the CHG soap.  Pat dry with a clean towel  Put on clean clothes/pajamas   If you choose to wear lotion, please use ONLY the CHG-compatible lotions that are listed below.  Additional instructions for the day of surgery: DO NOT APPLY any lotions, deodorants, cologne, or perfumes.   Do not bring valuables to the hospital. [facility] is not responsible for any belongings/valuables. Do not wear nail polish, gel polish, artificial nails, or any other type of covering on natural nails (fingers and toes) Do not wear jewelry or makeup Put on clean/comfortable clothes.  Please brush your teeth.  Ask your nurse before applying any  prescription medications to the skin.     CHG Compatible Lotions   Aveeno Moisturizing lotion  Cetaphil Moisturizing Cream  Cetaphil Moisturizing Lotion  Clairol Herbal Essence Moisturizing Lotion, Dry Skin  Clairol Herbal Essence Moisturizing Lotion, Extra Dry Skin  Clairol Herbal Essence Moisturizing Lotion, Normal Skin  Curel Age Defying Therapeutic Moisturizing Lotion with Alpha Hydroxy  Curel Extreme Care Body Lotion  Curel Soothing Hands Moisturizing Hand Lotion  Curel Therapeutic Moisturizing Cream, Fragrance-Free  Curel Therapeutic Moisturizing Lotion, Fragrance-Free  Curel Therapeutic Moisturizing Lotion, Original Formula  Eucerin Daily Replenishing Lotion  Eucerin Dry Skin Therapy Plus Alpha Hydroxy Crme  Eucerin Dry Skin Therapy Plus Alpha Hydroxy Lotion  Eucerin Original Crme  Eucerin Original Lotion  Eucerin Plus Crme Eucerin Plus Lotion  Eucerin TriLipid Replenishing Lotion  Keri Anti-Bacterial Hand Lotion  Keri Deep Conditioning Original Lotion Dry Skin Formula Softly Scented  Keri Deep Conditioning Original Lotion, Fragrance Free Sensitive Skin Formula  Keri Lotion Fast Absorbing Fragrance Free Sensitive Skin Formula  Keri Lotion Fast Absorbing Softly Scented Dry Skin Formula  Keri Original Lotion  Keri Skin Renewal Lotion Keri Silky Smooth Lotion  Keri Silky Smooth Sensitive Skin Lotion  Nivea Body Creamy Conditioning Oil  Nivea Body Extra Enriched Lotion  Nivea Body Original Lotion  Nivea Body Sheer Moisturizing Lotion Nivea Crme  Nivea Skin Firming Lotion  NutraDerm 30 Skin Lotion  NutraDerm Skin Lotion  NutraDerm Therapeutic Skin Cream  NutraDerm Therapeutic Skin Lotion  ProShield Protective Hand Cream  Provon moisturizing lotion  Please read over the following fact sheets that you were given.

## 2024-02-17 ENCOUNTER — Inpatient Hospital Stay (HOSPITAL_COMMUNITY)
Admission: RE | Admit: 2024-02-17 | Discharge: 2024-02-17 | Disposition: A | Discharge status: Home / Self Care | Source: Ambulatory Visit

## 2024-02-18 ENCOUNTER — Other Ambulatory Visit: Payer: Self-pay | Admitting: Physician Assistant

## 2024-02-18 ENCOUNTER — Encounter (HOSPITAL_COMMUNITY): Payer: Self-pay

## 2024-02-18 ENCOUNTER — Encounter (HOSPITAL_COMMUNITY)
Admission: RE | Admit: 2024-02-18 | Discharge: 2024-02-18 | Disposition: A | Discharge status: Home / Self Care | Payer: MEDICAID | Source: Ambulatory Visit | Attending: Orthopaedic Surgery | Admitting: Orthopaedic Surgery

## 2024-02-18 ENCOUNTER — Other Ambulatory Visit: Payer: Self-pay

## 2024-02-18 VITALS — BP 124/99 | HR 97 | Temp 98.3°F | Resp 18 | Ht 70.0 in | Wt 239.4 lb

## 2024-02-18 DIAGNOSIS — G4733 Obstructive sleep apnea (adult) (pediatric): Secondary | ICD-10-CM | POA: Diagnosis not present

## 2024-02-18 DIAGNOSIS — I5022 Chronic systolic (congestive) heart failure: Secondary | ICD-10-CM | POA: Diagnosis not present

## 2024-02-18 DIAGNOSIS — Z01812 Encounter for preprocedural laboratory examination: Secondary | ICD-10-CM | POA: Diagnosis present

## 2024-02-18 DIAGNOSIS — K219 Gastro-esophageal reflux disease without esophagitis: Secondary | ICD-10-CM | POA: Diagnosis not present

## 2024-02-18 DIAGNOSIS — M1712 Unilateral primary osteoarthritis, left knee: Secondary | ICD-10-CM | POA: Diagnosis not present

## 2024-02-18 DIAGNOSIS — E119 Type 2 diabetes mellitus without complications: Secondary | ICD-10-CM | POA: Insufficient documentation

## 2024-02-18 DIAGNOSIS — I110 Hypertensive heart disease with heart failure: Secondary | ICD-10-CM | POA: Diagnosis not present

## 2024-02-18 DIAGNOSIS — F4310 Post-traumatic stress disorder, unspecified: Secondary | ICD-10-CM | POA: Insufficient documentation

## 2024-02-18 DIAGNOSIS — F32A Depression, unspecified: Secondary | ICD-10-CM | POA: Insufficient documentation

## 2024-02-18 DIAGNOSIS — Z01818 Encounter for other preprocedural examination: Secondary | ICD-10-CM

## 2024-02-18 HISTORY — DX: Heart failure, unspecified: I50.9

## 2024-02-18 HISTORY — DX: Type 2 diabetes mellitus without complications: E11.9

## 2024-02-18 LAB — CBC
HCT: 36.6 % — ABNORMAL LOW (ref 39.0–52.0)
Hemoglobin: 12.1 g/dL — ABNORMAL LOW (ref 13.0–17.0)
MCH: 30.9 pg (ref 26.0–34.0)
MCHC: 33.1 g/dL (ref 30.0–36.0)
MCV: 93.4 fL (ref 80.0–100.0)
Platelets: 277 10*3/uL (ref 150–400)
RBC: 3.92 MIL/uL — ABNORMAL LOW (ref 4.22–5.81)
RDW: 17.2 % — ABNORMAL HIGH (ref 11.5–15.5)
WBC: 4.3 10*3/uL (ref 4.0–10.5)
nRBC: 0 % (ref 0.0–0.2)

## 2024-02-18 LAB — COMPREHENSIVE METABOLIC PANEL WITH GFR
ALT: 15 U/L (ref 0–44)
AST: 25 U/L (ref 15–41)
Albumin: 4 g/dL (ref 3.5–5.0)
Alkaline Phosphatase: 74 U/L (ref 38–126)
Anion gap: 10 (ref 5–15)
BUN: 8 mg/dL (ref 6–20)
CO2: 20 mmol/L — ABNORMAL LOW (ref 22–32)
Calcium: 8.6 mg/dL — ABNORMAL LOW (ref 8.9–10.3)
Chloride: 101 mmol/L (ref 98–111)
Creatinine, Ser: 1.01 mg/dL (ref 0.61–1.24)
GFR, Estimated: >60 mL/min (ref >60)
Glucose, Bld: 93 mg/dL (ref 70–99)
Potassium: 4.9 mmol/L (ref 3.5–5.1)
Sodium: 131 mmol/L — ABNORMAL LOW (ref 135–145)
Total Bilirubin: 0.6 mg/dL (ref 0.0–1.2)
Total Protein: 7.4 g/dL (ref 6.5–8.1)

## 2024-02-18 LAB — HEMOGLOBIN A1C
Hgb A1c MFr Bld: 5.4 % (ref 4.8–5.6)
Mean Plasma Glucose: 108.28 mg/dL

## 2024-02-18 LAB — SURGICAL PCR SCREEN
MRSA, PCR: NEGATIVE
Staphylococcus aureus: NEGATIVE

## 2024-02-18 LAB — GLUCOSE, CAPILLARY: Glucose-Capillary: 105 mg/dL — ABNORMAL HIGH (ref 70–99)

## 2024-02-18 MED ORDER — ONDANSETRON HCL 4 MG PO TABS: 4.0000 mg | ORAL_TABLET | Freq: Three times a day (TID) | ORAL | 0 refills | Status: DC | PRN

## 2024-02-18 MED ORDER — OXYCODONE-ACETAMINOPHEN 5-325 MG PO TABS: 1.0000 | ORAL_TABLET | Freq: Four times a day (QID) | ORAL | 0 refills | Status: DC | PRN

## 2024-02-18 MED ORDER — DOCUSATE SODIUM 100 MG PO CAPS: 100.0000 mg | ORAL_CAPSULE | Freq: Every day | ORAL | 2 refills | Status: DC | PRN

## 2024-02-18 MED ORDER — METHOCARBAMOL 750 MG PO TABS
750.0000 mg | ORAL_TABLET | Freq: Three times a day (TID) | ORAL | 2 refills | Status: DC | PRN
Start: 2024-02-18 — End: 2024-05-20

## 2024-02-18 NOTE — Progress Notes (Signed)
 PCP - [facility] Cardiologist - Dr. Thom Fleeting  PPM/ICD - denies Device Orders - n/a Rep Notified - n/a  Chest x-ray - 01/24/24 EKG - 02/03/24 Stress Test - 2001 ECHO - 01/17/24 Cardiac Cath - 12/27/18  Sleep Study - OSA+ but does not wear CPAP   Type II DM but does not check blood sugars at home  Patient instructed to hold Farxiga  for 72 hours prior to procedure    Blood Thinner Instructions: n/a Aspirin  Instructions: n/a  ERAS Protcol - clears until 0800 PRE-SURGERY Ensure or G2- G2 as ordered  COVID TEST- n/a   Anesthesia review: yes - CHF, HTN  Patient states the he received cardiac clearance but Jannifer Mention states that she did not receive clearance.  Dr. Christiane Cowing requested clearance from [facility].  Stephenie Einstein is requesting clearance at this time.    Patient denies shortness of breath, fever, cough and chest pain at PAT appointment   All instructions explained to the patient, with a verbal understanding of the material. Patient agrees to go over the instructions while at home for a better understanding. Patient also instructed to self quarantine after being tested for COVID-19. The opportunity to ask questions was provided.

## 2024-02-19 ENCOUNTER — Encounter (HOSPITAL_COMMUNITY): Payer: Self-pay | Admitting: Physician Assistant
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
## 2024-02-29 ENCOUNTER — Other Ambulatory Visit: Payer: Self-pay | Admitting: Physician Assistant

## 2024-02-29 ENCOUNTER — Ambulatory Visit (HOSPITAL_COMMUNITY)
Admission: RE | Admit: 2024-02-29 | Discharge: 2024-03-02 | Disposition: A | Discharge status: Home / Self Care | Payer: MEDICAID | Attending: Orthopaedic Surgery | Admitting: Orthopaedic Surgery

## 2024-02-29 ENCOUNTER — Encounter (HOSPITAL_COMMUNITY)
Admission: RE | Disposition: A | Discharge status: Home / Self Care | Payer: Self-pay | Source: Home / Self Care | Attending: Orthopaedic Surgery

## 2024-02-29 DIAGNOSIS — M1712 Unilateral primary osteoarthritis, left knee: Secondary | ICD-10-CM

## 2024-02-29 SURGERY — ARTHROPLASTY, KNEE, TOTAL
Anesthesia: Spinal | Site: Knee | Laterality: Left

## 2024-03-01 ENCOUNTER — Other Ambulatory Visit: Payer: Self-pay | Admitting: Physician Assistant

## 2024-03-01 NOTE — Telephone Encounter (Signed)
 Ok, sounds great. Just save until after we operate

## 2024-03-01 NOTE — Telephone Encounter (Signed)
 I spoke with patient. He states that he has 4 bottles of medicine at home. He does not need a refill.

## 2024-03-01 NOTE — Telephone Encounter (Signed)
 I sent in the original rx with instructions to be taken after surgery.  Did he already take his post op meds even though surgery was cancelled?

## 2024-03-10 ENCOUNTER — Other Ambulatory Visit: Payer: Self-pay

## 2024-03-10 ENCOUNTER — Inpatient Hospital Stay (HOSPITAL_COMMUNITY)
Admission: EM | Admit: 2024-03-10 | Discharge: 2024-03-13 | DRG: 312 | Disposition: A | Discharge status: Home / Self Care | Attending: Internal Medicine | Admitting: Internal Medicine

## 2024-03-10 ENCOUNTER — Encounter (HOSPITAL_COMMUNITY): Payer: Self-pay

## 2024-03-10 ENCOUNTER — Emergency Department (HOSPITAL_COMMUNITY)

## 2024-03-10 DIAGNOSIS — R300 Dysuria: Secondary | ICD-10-CM | POA: Diagnosis present

## 2024-03-10 DIAGNOSIS — Z79899 Other long term (current) drug therapy: Secondary | ICD-10-CM

## 2024-03-10 DIAGNOSIS — K5900 Constipation, unspecified: Secondary | ICD-10-CM | POA: Diagnosis present

## 2024-03-10 DIAGNOSIS — I951 Orthostatic hypotension: Principal | ICD-10-CM | POA: Diagnosis present

## 2024-03-10 DIAGNOSIS — M542 Cervicalgia: Secondary | ICD-10-CM | POA: Diagnosis present

## 2024-03-10 DIAGNOSIS — N179 Acute kidney failure, unspecified: Secondary | ICD-10-CM | POA: Diagnosis present

## 2024-03-10 DIAGNOSIS — F1020 Alcohol dependence, uncomplicated: Secondary | ICD-10-CM | POA: Diagnosis present

## 2024-03-10 DIAGNOSIS — E871 Hypo-osmolality and hyponatremia: Secondary | ICD-10-CM | POA: Diagnosis present

## 2024-03-10 DIAGNOSIS — R6883 Chills (without fever): Secondary | ICD-10-CM | POA: Diagnosis present

## 2024-03-10 DIAGNOSIS — D649 Anemia, unspecified: Secondary | ICD-10-CM | POA: Diagnosis present

## 2024-03-10 DIAGNOSIS — E875 Hyperkalemia: Secondary | ICD-10-CM | POA: Diagnosis present

## 2024-03-10 DIAGNOSIS — K5190 Ulcerative colitis, unspecified, without complications: Secondary | ICD-10-CM | POA: Diagnosis present

## 2024-03-10 DIAGNOSIS — Z87891 Personal history of nicotine dependence: Secondary | ICD-10-CM | POA: Diagnosis not present

## 2024-03-10 DIAGNOSIS — I110 Hypertensive heart disease with heart failure: Secondary | ICD-10-CM | POA: Diagnosis present

## 2024-03-10 DIAGNOSIS — J45909 Unspecified asthma, uncomplicated: Secondary | ICD-10-CM | POA: Diagnosis present

## 2024-03-10 DIAGNOSIS — N281 Cyst of kidney, acquired: Secondary | ICD-10-CM | POA: Diagnosis present

## 2024-03-10 DIAGNOSIS — Z886 Allergy status to analgesic agent status: Secondary | ICD-10-CM

## 2024-03-10 DIAGNOSIS — Z8719 Personal history of other diseases of the digestive system: Secondary | ICD-10-CM

## 2024-03-10 DIAGNOSIS — Z7984 Long term (current) use of oral hypoglycemic drugs: Secondary | ICD-10-CM | POA: Diagnosis not present

## 2024-03-10 DIAGNOSIS — R61 Generalized hyperhidrosis: Secondary | ICD-10-CM | POA: Diagnosis present

## 2024-03-10 DIAGNOSIS — E861 Hypovolemia: Secondary | ICD-10-CM | POA: Diagnosis present

## 2024-03-10 DIAGNOSIS — G43909 Migraine, unspecified, not intractable, without status migrainosus: Secondary | ICD-10-CM | POA: Diagnosis present

## 2024-03-10 DIAGNOSIS — R197 Diarrhea, unspecified: Secondary | ICD-10-CM | POA: Diagnosis present

## 2024-03-10 DIAGNOSIS — R55 Syncope and collapse: Secondary | ICD-10-CM | POA: Diagnosis present

## 2024-03-10 DIAGNOSIS — I5042 Chronic combined systolic (congestive) and diastolic (congestive) heart failure: Secondary | ICD-10-CM | POA: Diagnosis present

## 2024-03-10 DIAGNOSIS — K760 Fatty (change of) liver, not elsewhere classified: Secondary | ICD-10-CM | POA: Diagnosis present

## 2024-03-10 DIAGNOSIS — Z880 Allergy status to penicillin: Secondary | ICD-10-CM | POA: Diagnosis not present

## 2024-03-10 LAB — BASIC METABOLIC PANEL WITH GFR
Anion gap: 7 (ref 5–15)
Anion gap: 10 (ref 5–15)
BUN: 12 mg/dL (ref 6–20)
BUN: 14 mg/dL (ref 6–20)
CO2: 21 mmol/L — ABNORMAL LOW (ref 22–32)
CO2: 19 mmol/L — ABNORMAL LOW (ref 22–32)
Calcium: 8.6 mg/dL — ABNORMAL LOW (ref 8.9–10.3)
Calcium: 8.2 mg/dL — ABNORMAL LOW (ref 8.9–10.3)
Chloride: 95 mmol/L — ABNORMAL LOW (ref 98–111)
Chloride: 95 mmol/L — ABNORMAL LOW (ref 98–111)
Creatinine, Ser: 1.57 mg/dL — ABNORMAL HIGH (ref 0.61–1.24)
Creatinine, Ser: 1.61 mg/dL — ABNORMAL HIGH (ref 0.61–1.24)
GFR, Estimated: 51 mL/min — ABNORMAL LOW (ref >60)
GFR, Estimated: 49 mL/min — ABNORMAL LOW (ref >60)
Glucose, Bld: 106 mg/dL — ABNORMAL HIGH (ref 70–99)
Glucose, Bld: 118 mg/dL — ABNORMAL HIGH (ref 70–99)
Potassium: 5.5 mmol/L — ABNORMAL HIGH (ref 3.5–5.1)
Potassium: 4.7 mmol/L (ref 3.5–5.1)
Sodium: 123 mmol/L — ABNORMAL LOW (ref 135–145)
Sodium: 124 mmol/L — ABNORMAL LOW (ref 135–145)

## 2024-03-10 LAB — CBC WITH DIFFERENTIAL/PLATELET
Abs Immature Granulocytes: 0.03 10*3/uL (ref 0.00–0.07)
Basophils Absolute: 0 10*3/uL (ref 0.0–0.1)
Basophils Relative: 1 %
Eosinophils Absolute: 0.1 10*3/uL (ref 0.0–0.5)
Eosinophils Relative: 2 %
HCT: 32.6 % — ABNORMAL LOW (ref 39.0–52.0)
Hemoglobin: 11.5 g/dL — ABNORMAL LOW (ref 13.0–17.0)
Immature Granulocytes: 1 %
Lymphocytes Relative: 21 %
Lymphs Abs: 1 10*3/uL (ref 0.7–4.0)
MCH: 31.9 pg (ref 26.0–34.0)
MCHC: 35.3 g/dL (ref 30.0–36.0)
MCV: 90.6 fL (ref 80.0–100.0)
Monocytes Absolute: 1.1 10*3/uL — ABNORMAL HIGH (ref 0.1–1.0)
Monocytes Relative: 23 %
Neutro Abs: 2.5 10*3/uL (ref 1.7–7.7)
Neutrophils Relative %: 52 %
Platelets: 233 10*3/uL (ref 150–400)
RBC: 3.6 MIL/uL — ABNORMAL LOW (ref 4.22–5.81)
RDW: 15.1 % (ref 11.5–15.5)
WBC: 4.8 10*3/uL (ref 4.0–10.5)
nRBC: 0 % (ref 0.0–0.2)

## 2024-03-10 LAB — C-REACTIVE PROTEIN: CRP: 0.8 mg/dL (ref <1.0)

## 2024-03-10 LAB — I-STAT CHEM 8, ED
BUN: 12 mg/dL (ref 6–20)
BUN: 11 mg/dL (ref 6–20)
Calcium, Ion: 0.71 mmol/L — CL (ref 1.15–1.40)
Calcium, Ion: 1.12 mmol/L — ABNORMAL LOW (ref 1.15–1.40)
Chloride: 97 mmol/L — ABNORMAL LOW (ref 98–111)
Chloride: 90 mmol/L — ABNORMAL LOW (ref 98–111)
Creatinine, Ser: 1.4 mg/dL — ABNORMAL HIGH (ref 0.61–1.24)
Creatinine, Ser: 1.3 mg/dL — ABNORMAL HIGH (ref 0.61–1.24)
Glucose, Bld: 118 mg/dL — ABNORMAL HIGH (ref 70–99)
Glucose, Bld: 115 mg/dL — ABNORMAL HIGH (ref 70–99)
HCT: 36 % — ABNORMAL LOW (ref 39.0–52.0)
HCT: 35 % — ABNORMAL LOW (ref 39.0–52.0)
Hemoglobin: 12.2 g/dL — ABNORMAL LOW (ref 13.0–17.0)
Hemoglobin: 11.9 g/dL — ABNORMAL LOW (ref 13.0–17.0)
Potassium: 4.9 mmol/L (ref 3.5–5.1)
Potassium: 5.2 mmol/L — ABNORMAL HIGH (ref 3.5–5.1)
Sodium: 117 mmol/L — CL (ref 135–145)
Sodium: 121 mmol/L — ABNORMAL LOW (ref 135–145)
TCO2: 19 mmol/L — ABNORMAL LOW (ref 22–32)
TCO2: 20 mmol/L — ABNORMAL LOW (ref 22–32)

## 2024-03-10 LAB — SEDIMENTATION RATE: Sed Rate: 30 mm/h — ABNORMAL HIGH (ref 0–16)

## 2024-03-10 LAB — CBC
HCT: 18 % — ABNORMAL LOW (ref 39.0–52.0)
Hemoglobin: 5.9 g/dL — CL (ref 13.0–17.0)
MCH: 31.6 pg (ref 26.0–34.0)
MCHC: 32.8 g/dL (ref 30.0–36.0)
MCV: 96.3 fL (ref 80.0–100.0)
Platelets: 131 10*3/uL — ABNORMAL LOW (ref 150–400)
RBC: 1.87 MIL/uL — ABNORMAL LOW (ref 4.22–5.81)
RDW: 15.3 % (ref 11.5–15.5)
WBC: 2.8 10*3/uL — ABNORMAL LOW (ref 4.0–10.5)
nRBC: 0 % (ref 0.0–0.2)

## 2024-03-10 LAB — COMPREHENSIVE METABOLIC PANEL WITH GFR
ALT: 7 U/L (ref 0–44)
ALT: 13 U/L (ref 0–44)
AST: 10 U/L — ABNORMAL LOW (ref 15–41)
AST: 17 U/L (ref 15–41)
Albumin: <1.5 g/dL — ABNORMAL LOW (ref 3.5–5.0)
Albumin: 3.1 g/dL — ABNORMAL LOW (ref 3.5–5.0)
Alkaline Phosphatase: 28 U/L — ABNORMAL LOW (ref 38–126)
Alkaline Phosphatase: 69 U/L (ref 38–126)
Anion gap: 17 — ABNORMAL HIGH (ref 5–15)
Anion gap: 8 (ref 5–15)
BUN: 6 mg/dL (ref 6–20)
BUN: 10 mg/dL (ref 6–20)
CO2: 9 mmol/L — ABNORMAL LOW (ref 22–32)
CO2: 20 mmol/L — ABNORMAL LOW (ref 22–32)
Calcium: 6.8 mg/dL — ABNORMAL LOW (ref 8.9–10.3)
Calcium: 8.7 mg/dL — ABNORMAL LOW (ref 8.9–10.3)
Chloride: 102 mmol/L (ref 98–111)
Chloride: 93 mmol/L — ABNORMAL LOW (ref 98–111)
Creatinine, Ser: 0.55 mg/dL — ABNORMAL LOW (ref 0.61–1.24)
Creatinine, Ser: 1.31 mg/dL — ABNORMAL HIGH (ref 0.61–1.24)
GFR, Estimated: >60 mL/min (ref >60)
GFR, Estimated: >60 mL/min (ref >60)
Glucose, Bld: 66 mg/dL — ABNORMAL LOW (ref 70–99)
Glucose, Bld: 114 mg/dL — ABNORMAL HIGH (ref 70–99)
Potassium: 4.3 mmol/L (ref 3.5–5.1)
Potassium: 5.1 mmol/L (ref 3.5–5.1)
Sodium: 128 mmol/L — ABNORMAL LOW (ref 135–145)
Sodium: 121 mmol/L — ABNORMAL LOW (ref 135–145)
Total Bilirubin: 0.2 mg/dL (ref 0.0–1.2)
Total Bilirubin: 0.4 mg/dL (ref 0.0–1.2)
Total Protein: <3 g/dL — ABNORMAL LOW (ref 6.5–8.1)
Total Protein: 6.2 g/dL — ABNORMAL LOW (ref 6.5–8.1)

## 2024-03-10 LAB — URINALYSIS, ROUTINE W REFLEX MICROSCOPIC
Bilirubin Urine: NEGATIVE
Glucose, UA: 50 mg/dL — AB
Hgb urine dipstick: NEGATIVE
Ketones, ur: NEGATIVE mg/dL
Leukocytes,Ua: NEGATIVE
Nitrite: NEGATIVE
Protein, ur: NEGATIVE mg/dL
Specific Gravity, Urine: 1.017 (ref 1.005–1.030)
pH: 5 (ref 5.0–8.0)

## 2024-03-10 LAB — I-STAT CG4 LACTIC ACID, ED: Lactic Acid, Venous: 1.5 mmol/L (ref 0.5–1.9)

## 2024-03-10 LAB — ETHANOL: Alcohol, Ethyl (B): <15 mg/dL (ref <15)

## 2024-03-10 LAB — POC OCCULT BLOOD, ED: Fecal Occult Bld: NEGATIVE

## 2024-03-10 LAB — LIPASE, BLOOD: Lipase: 30 U/L (ref 11–51)

## 2024-03-10 LAB — PREPARE RBC (CROSSMATCH)

## 2024-03-10 LAB — CBG MONITORING, ED: Glucose-Capillary: 118 mg/dL — ABNORMAL HIGH (ref 70–99)

## 2024-03-10 LAB — BRAIN NATRIURETIC PEPTIDE: B Natriuretic Peptide: 10.2 pg/mL (ref 0.0–100.0)

## 2024-03-10 MED ORDER — ACETAMINOPHEN 325 MG PO TABS
650.0000 mg | ORAL_TABLET | Freq: Four times a day (QID) | ORAL | Status: DC | PRN
  Administered 2024-03-10: 650 mg via ORAL
  Filled 2024-03-10: qty 2

## 2024-03-10 MED ORDER — CARVEDILOL 6.25 MG PO TABS
6.2500 mg | ORAL_TABLET | Freq: Two times a day (BID) | ORAL | Status: DC
  Administered 2024-03-10 – 2024-03-13 (×6): 6.25 mg via ORAL
  Filled 2024-03-10 (×6): qty 1

## 2024-03-10 MED ORDER — DULOXETINE HCL 30 MG PO CPEP
30.0000 mg | ORAL_CAPSULE | Freq: Two times a day (BID) | ORAL | Status: DC
  Administered 2024-03-10 – 2024-03-13 (×6): 30 mg via ORAL
  Filled 2024-03-10 (×6): qty 1

## 2024-03-10 MED ORDER — OXYCODONE HCL 5 MG PO TABS
5.0000 mg | ORAL_TABLET | ORAL | Status: DC | PRN
  Administered 2024-03-10 – 2024-03-13 (×5): 5 mg via ORAL
  Filled 2024-03-10 (×5): qty 1

## 2024-03-10 MED ORDER — THIAMINE HCL 100 MG PO TABS
100.0000 mg | ORAL_TABLET | Freq: Every day | ORAL | Status: DC
  Administered 2024-03-10 – 2024-03-13 (×4): 100 mg via ORAL
  Filled 2024-03-10 (×8): qty 1

## 2024-03-10 MED ORDER — ACETAMINOPHEN 650 MG RE SUPP: 650.0000 mg | Freq: Four times a day (QID) | RECTAL | Status: DC | PRN

## 2024-03-10 MED ORDER — RIFAXIMIN 550 MG PO TABS
550.0000 mg | ORAL_TABLET | Freq: Two times a day (BID) | ORAL | Status: DC
  Administered 2024-03-10 – 2024-03-13 (×6): 550 mg via ORAL
  Filled 2024-03-10 (×7): qty 1

## 2024-03-10 MED ORDER — SODIUM CHLORIDE 0.9 % IV BOLUS
1000.0000 mL | Freq: Once | INTRAVENOUS | Status: AC
Start: 2024-03-10 — End: 2024-03-10
  Administered 2024-03-10: 1000 mL via INTRAVENOUS

## 2024-03-10 MED ORDER — SODIUM CHLORIDE 0.9% IV SOLUTION: Freq: Once | INTRAVENOUS | Status: DC

## 2024-03-10 MED ORDER — FOLIC ACID 1 MG PO TABS
1.0000 mg | ORAL_TABLET | Freq: Every day | ORAL | Status: DC
  Administered 2024-03-10 – 2024-03-13 (×4): 1 mg via ORAL
  Filled 2024-03-10 (×4): qty 1

## 2024-03-10 MED ORDER — MESALAMINE 1.2 G PO TBEC
4.8000 g | DELAYED_RELEASE_TABLET | Freq: Every day | ORAL | Status: DC
  Administered 2024-03-11 – 2024-03-13 (×3): 4.8 g via ORAL
  Filled 2024-03-10 (×3): qty 4

## 2024-03-10 MED ORDER — IOHEXOL 350 MG/ML SOLN
75.0000 mL | Freq: Once | INTRAVENOUS | Status: AC | PRN
Start: 2024-03-10 — End: 2024-03-10
  Administered 2024-03-10: 75 mL via INTRAVENOUS

## 2024-03-10 MED ORDER — LORAZEPAM 2 MG/ML IJ SOLN: 1.0000 mg | INTRAMUSCULAR | Status: AC | PRN

## 2024-03-10 MED ORDER — RIVAROXABAN 10 MG PO TABS
10.0000 mg | ORAL_TABLET | Freq: Every day | ORAL | Status: DC
  Administered 2024-03-10 – 2024-03-12 (×3): 10 mg via ORAL
  Filled 2024-03-10 (×3): qty 1

## 2024-03-10 MED ORDER — LORAZEPAM 1 MG PO TABS
1.0000 mg | ORAL_TABLET | ORAL | Status: AC | PRN
  Administered 2024-03-10 – 2024-03-13 (×2): 1 mg via ORAL
  Filled 2024-03-10 (×2): qty 1

## 2024-03-10 MED ORDER — ATORVASTATIN CALCIUM 40 MG PO TABS
40.0000 mg | ORAL_TABLET | Freq: Every day | ORAL | Status: DC
  Administered 2024-03-11 – 2024-03-13 (×3): 40 mg via ORAL
  Filled 2024-03-10 (×3): qty 1

## 2024-03-10 MED ORDER — SODIUM CHLORIDE 0.9 % IV BOLUS
1000.0000 mL | Freq: Once | INTRAVENOUS | Status: AC
  Administered 2024-03-10: 1000 mL via INTRAVENOUS

## 2024-03-10 MED ORDER — LACTATED RINGERS IV BOLUS
1000.0000 mL | Freq: Once | INTRAVENOUS | Status: AC
  Administered 2024-03-10: 1000 mL via INTRAVENOUS

## 2024-03-10 MED ORDER — METHOCARBAMOL 500 MG PO TABS
750.0000 mg | ORAL_TABLET | Freq: Three times a day (TID) | ORAL | Status: DC | PRN
  Administered 2024-03-10 – 2024-03-12 (×3): 750 mg via ORAL
  Filled 2024-03-10 (×3): qty 2

## 2024-03-10 MED ORDER — SODIUM ZIRCONIUM CYCLOSILICATE 10 G PO PACK
10.0000 g | PACK | Freq: Once | ORAL | Status: AC
Start: 2024-03-10 — End: 2024-03-10
  Administered 2024-03-10: 10 g via ORAL
  Filled 2024-03-10: qty 1

## 2024-03-10 MED ORDER — ADULT MULTIVITAMIN W/MINERALS CH
1.0000 | ORAL_TABLET | Freq: Every day | ORAL | Status: DC
  Administered 2024-03-10 – 2024-03-13 (×4): 1 via ORAL
  Filled 2024-03-10 (×4): qty 1

## 2024-03-10 MED ORDER — QUETIAPINE FUMARATE 100 MG PO TABS
200.0000 mg | ORAL_TABLET | Freq: Every day | ORAL | Status: DC
  Administered 2024-03-10 – 2024-03-12 (×3): 200 mg via ORAL
  Filled 2024-03-10 (×3): qty 2

## 2024-03-10 NOTE — Discharge Planning (Signed)
 Pt active at [facility] [phone] [id number] Medical Provider: Gaylen Kay SW: Adolm Hooks  Desk phone: [phone] [facility] call [facility] for additional information [phone]

## 2024-03-10 NOTE — ED Notes (Signed)
 Admitting MD at [facility], pt alert, NAD, calm, interactive.

## 2024-03-10 NOTE — ED Provider Notes (Addendum)
 Date: 03/10/2024  Rate: 86  Rhythm: normal sinus rhythm  QRS Axis: normal  Intervals: normal  ST/T Wave abnormalities: normal  Conduction Disutrbances: none  Narrative Interpretation:   Old EKG Reviewed: No significant changes noted  .Critical Care  Performed by: Debbra Fairy, PA-C Authorized by: Debbra Fairy, PA-C   Critical care provider statement:    Critical care time (minutes):  30   Critical care was time spent personally by me on the following activities:  Development of treatment plan with patient or surrogate, discussions with consultants, evaluation of patient's response to treatment, examination of patient, ordering and review of laboratory studies, ordering and review of radiographic studies, ordering and performing treatments and interventions, pulse oximetry, re-evaluation of patient's condition and review of old charts     Received signout from previous provider, please see her note for complete H&P.  This is a 58 year old male with significant history of alcohol abuse, CHF, hypertension, GI bleed brought here for evaluation of syncope.  Patient has had persistent diarrhea for the past several days and has had multiple syncopal episode at home.  Family found him on the floor and when EMS arrived, patient was noted to be hypotensive.  Initially, his blood pressure was in the 80s systolics and he did receive IV fluid.  He was Hemoccult negative.  His initial set of blood work was grossly abnormal likely due to dilution from IV fluid.  However, after several labs redrawn, his most up-to-date labs shows a sodium of 121.  A CT scan of his abdomen pelvis did not show any acute finding.  EKG shows normal sinus rhythm.  Patient initially received a liter of lactated Ringer 's.  In the setting of low sodium, I will give a liter of sodium chloride  bolus.  Appreciate consultation from Internal medicine resident who agrees to see and will admit pt for further care.   BP 110/68   Pulse  92   Temp 98.6 F (37 C) (Oral)   Resp 15   Ht 5\' 10"  (1.778 m)   Wt 106.6 kg   SpO2 100%   BMI 33.72 kg/m   Results for orders placed or performed during the hospital encounter of 03/10/24  Comprehensive metabolic panel   Collection Time: 03/10/24  3:53 AM  Result Value Ref Range   Sodium 128 (L) 135 - 145 mmol/L   Potassium 4.3 3.5 - 5.1 mmol/L   Chloride 102 98 - 111 mmol/L   CO2 9 (L) 22 - 32 mmol/L   Glucose, Bld 66 (L) 70 - 99 mg/dL   BUN 6 6 - 20 mg/dL   Creatinine, Ser 2.44 (L) 0.61 - 1.24 mg/dL   Calcium  6.8 (L) 8.9 - 10.3 mg/dL   Total Protein <0.1 (L) 6.5 - 8.1 g/dL   Albumin <0.2 (L) 3.5 - 5.0 g/dL   AST 10 (L) 15 - 41 U/L   ALT 7 0 - 44 U/L   Alkaline Phosphatase 28 (L) 38 - 126 U/L   Total Bilirubin 0.2 0.0 - 1.2 mg/dL   GFR, Estimated >72 >53 mL/min   Anion gap 17 (H) 5 - 15  CBC   Collection Time: 03/10/24  3:53 AM  Result Value Ref Range   WBC 2.8 (L) 4.0 - 10.5 K/uL   RBC 1.87 (L) 4.22 - 5.81 MIL/uL   Hemoglobin 5.9 (LL) 13.0 - 17.0 g/dL   HCT 66.4 (L) 40.3 - 47.4 %   MCV 96.3 80.0 - 100.0 fL   MCH  31.6 26.0 - 34.0 pg   MCHC 32.8 30.0 - 36.0 g/dL   RDW 16.1 09.6 - 04.5 %   Platelets 131 (L) 150 - 400 K/uL   nRBC 0.0 0.0 - 0.2 %  Brain natriuretic peptide   Collection Time: 03/10/24  3:53 AM  Result Value Ref Range   B Natriuretic Peptide 10.2 0.0 - 100.0 pg/mL  Ethanol   Collection Time: 03/10/24  3:53 AM  Result Value Ref Range   Alcohol, Ethyl (B) <15 <15 mg/dL  Lipase, blood   Collection Time: 03/10/24  3:53 AM  Result Value Ref Range   Lipase 30 11 - 51 U/L  POC occult blood, ED   Collection Time: 03/10/24  4:04 AM  Result Value Ref Range   Fecal Occult Bld NEGATIVE NEGATIVE  CBG monitoring, ED   Collection Time: 03/10/24  4:51 AM  Result Value Ref Range   Glucose-Capillary 118 (H) 70 - 99 mg/dL  Type and screen   Collection Time: 03/10/24  4:55 AM  Result Value Ref Range   ABO/RH(D) O POS    Antibody Screen NEG    Sample  Expiration      03/13/2024,2359 Performed at [facility], [address] [place], [place] [address]    Unit Number [id number]    Blood Component Type RBC LR PHER2    Unit division 00    Status of Unit ALLOCATED    Transfusion Status OK TO TRANSFUSE    Crossmatch Result Compatible    Unit Number [id number]    Blood Component Type RED CELLS,LR    Unit division 00    Status of Unit ALLOCATED    Transfusion Status OK TO TRANSFUSE    Crossmatch Result Compatible   BPAM RBC   Collection Time: 03/10/24  4:55 AM  Result Value Ref Range   ISSUE DATE / TIME [id number]    Blood Product Unit Number [id number]    PRODUCT CODE [id number]    Unit Type and Rh 5100    Blood Product Expiration Date [id number]    ISSUE DATE / TIME 166063016010    Blood Product Unit Number [id number]    PRODUCT CODE [id number]    Unit Type and Rh 5100    Blood Product Expiration Date 427062376283   I-Stat CG4 Lactic Acid   Collection Time: 03/10/24  5:00 AM  Result Value Ref Range   Lactic Acid, Venous 1.5 0.5 - 1.9 mmol/L  CBC with Differential   Collection Time: 03/10/24  5:02 AM  Result Value Ref Range   WBC 4.8 4.0 - 10.5 K/uL   RBC 3.60 (L) 4.22 - 5.81 MIL/uL   Hemoglobin 11.5 (L) 13.0 - 17.0 g/dL   HCT 15.1 (L) 76.1 - 60.7 %   MCV 90.6 80.0 - 100.0 fL   MCH 31.9 26.0 - 34.0 pg   MCHC 35.3 30.0 - 36.0 g/dL   RDW 37.1 06.2 - 69.4 %   Platelets 233 150 - 400 K/uL   nRBC 0.0 0.0 - 0.2 %   Neutrophils Relative % 52 %   Neutro Abs 2.5 1.7 - 7.7 K/uL   Lymphocytes Relative 21 %   Lymphs Abs 1.0 0.7 - 4.0 K/uL   Monocytes Relative 23 %   Monocytes Absolute 1.1 (H) 0.1 - 1.0 K/uL   Eosinophils Relative 2 %   Eosinophils Absolute 0.1 0.0 - 0.5 K/uL   Basophils Relative 1 %   Basophils Absolute 0.0 0.0 - 0.1 K/uL   Immature  Granulocytes 1 %   Abs Immature Granulocytes 0.03 0.00 - 0.07 K/uL  I-stat chem 8, ED (not at [facility], [facility] or [facility])   Collection Time: 03/10/24  5:05 AM  Result  Value Ref Range   Sodium 117 (LL) 135 - 145 mmol/L   Potassium 4.9 3.5 - 5.1 mmol/L   Chloride 97 (L) 98 - 111 mmol/L   BUN 12 6 - 20 mg/dL   Creatinine, Ser 1.61 (H) 0.61 - 1.24 mg/dL   Glucose, Bld 096 (H) 70 - 99 mg/dL   Calcium , Ion 0.71 (LL) 1.15 - 1.40 mmol/L   TCO2 19 (L) 22 - 32 mmol/L   Hemoglobin 12.2 (L) 13.0 - 17.0 g/dL   HCT 04.5 (L) 40.9 - 81.1 %   Comment NOTIFIED PHYSICIAN   Prepare RBC (crossmatch)   Collection Time: 03/10/24  5:30 AM  Result Value Ref Range   Order Confirmation      ORDER PROCESSED BY BLOOD BANK Performed at [facility], [address] [place], [place] [address]   Comprehensive metabolic panel with GFR   Collection Time: 03/10/24  6:58 AM  Result Value Ref Range   Sodium 121 (L) 135 - 145 mmol/L   Potassium 5.1 3.5 - 5.1 mmol/L   Chloride 93 (L) 98 - 111 mmol/L   CO2 20 (L) 22 - 32 mmol/L   Glucose, Bld 114 (H) 70 - 99 mg/dL   BUN 10 6 - 20 mg/dL   Creatinine, Ser 2.95 (H) 0.61 - 1.24 mg/dL   Calcium  8.7 (L) 8.9 - 10.3 mg/dL   Total Protein 6.2 (L) 6.5 - 8.1 g/dL   Albumin 3.1 (L) 3.5 - 5.0 g/dL   AST 17 15 - 41 U/L   ALT 13 0 - 44 U/L   Alkaline Phosphatase 69 38 - 126 U/L   Total Bilirubin 0.4 0.0 - 1.2 mg/dL   GFR, Estimated >62 >13 mL/min   Anion gap 8 5 - 15  I-stat chem 8, ED (not at [facility], [facility] or [facility])   Collection Time: 03/10/24  6:59 AM  Result Value Ref Range   Sodium 121 (L) 135 - 145 mmol/L   Potassium 5.2 (H) 3.5 - 5.1 mmol/L   Chloride 90 (L) 98 - 111 mmol/L   BUN 11 6 - 20 mg/dL   Creatinine, Ser 0.86 (H) 0.61 - 1.24 mg/dL   Glucose, Bld 578 (H) 70 - 99 mg/dL   Calcium , Ion 1.12 (L) 1.15 - 1.40 mmol/L   TCO2 20 (L) 22 - 32 mmol/L   Hemoglobin 11.9 (L) 13.0 - 17.0 g/dL   HCT 46.9 (L) 62.9 - 52.8 %   CT ABDOMEN PELVIS W CONTRAST Result Date: 03/10/2024 CLINICAL DATA:  Diarrhea.  Hypotension. EXAM: CT ABDOMEN AND PELVIS WITH CONTRAST TECHNIQUE: Multidetector CT imaging of the abdomen and pelvis was performed  using the standard protocol following bolus administration of intravenous contrast. RADIATION DOSE REDUCTION: This exam was performed according to the departmental dose-optimization program which includes automated exposure control, adjustment of the mA and/or kV according to patient size and/or use of iterative reconstruction technique. CONTRAST:  75mL OMNIPAQUE  IOHEXOL  350 MG/ML SOLN COMPARISON:  10/14/2022 FINDINGS: Lower chest: Mild dependent changes along the lung bases. No significant pleural effusion or consolidative change. Hepatobiliary: No focal liver abnormality is seen. No gallstones, gallbladder wall thickening, or biliary dilatation. Pancreas: Unremarkable. No pancreatic ductal dilatation or surrounding inflammatory changes. Spleen: Normal in size without focal abnormality. Adrenals/Urinary Tract: Normal adrenal glands. Bilateral  kidney cysts. The largest is off the upper pole of left kidney measuring 2 cm. No follow-up imaging recommended. No nephrolithiasis or hydronephrosis. Urinary bladder appears normal. Stomach/Bowel: Stomach is within normal limits. No signs of acute appendicitis. No pathologic dilatation of the large or small bowel loops. No significant bowel wall thickening or inflammation. Vascular/Lymphatic: No significant vascular findings are present. No enlarged abdominal or pelvic lymph nodes. Reproductive: Prostate is unremarkable. Other: No ascites or focal fluid collections. Fat containing, midline ventral abdominal wall hernia just above the umbilicus measures 3.5 cm. No signs of free air. Musculoskeletal: No acute or significant osseous findings. Lumbar degenerative disc disease. IMPRESSION: 1. No acute findings within the abdomen or pelvis. 2. Fat containing, midline ventral abdominal wall hernia just above the umbilicus measures 3.5 cm. Electronically Signed   By: Kimberley Penman M.D.   On: 03/10/2024 06:03      Debbra Fairy, PA-C 03/10/24 0814    Debbra Fairy, PA-C 03/10/24  [id number]    Roberts Ching, MD 03/10/24 1113

## 2024-03-10 NOTE — ED Notes (Signed)
EDPA at BS speaking with pt 

## 2024-03-10 NOTE — ED Notes (Addendum)
 Restless sleep, changing positions, NAD, calm, sonorous resps, VSS. Pending confirmatory CMP.

## 2024-03-10 NOTE — Progress Notes (Signed)
 Transition of Care [facility]) - Inpatient Brief Assessment   Patient Details  Name: Jacob Adams MRN: [id number] Date of Birth: August 09, 1966  [facility] CM/SW Contact:    Joanette Moynahan, RN Phone Number: 03/10/2024, 10:21 AM   Clinical Narrative: RNCM met with pt at bedside regarding discharge plan.  Pt plans to return home and prepare for upcoming surgery and after that, go home with home health.  RNCM following for discharge needs. Alexandro Line J. Rachel Budds, BSN,  RN, [place] [phone]    Transition of Care Asessment: Insurance and Status: (P) Insurance coverage has been reviewed Patient has primary care physician: (P) Yes Home environment has been reviewed: (P) home with son and granschildren Prior level of function:: (P) independent Prior/Current Home Services: (P) No current home services Social Drivers of Health Review: (P) SDOH reviewed no interventions necessary Readmission risk has been reviewed: (P) Yes Transition of care needs: (P) transition of care needs identified, TOC will continue to follow

## 2024-03-10 NOTE — Plan of Care (Signed)
  Problem: Education: Goal: Knowledge of General Education information will improve Description: Including pain rating scale, medication(s)/side effects and non-pharmacologic comfort measures Outcome: Progressing   Problem: Clinical Measurements: Goal: Ability to maintain clinical measurements within normal limits will improve Outcome: Progressing   Problem: Clinical Measurements: Goal: Will remain free from infection Outcome: Progressing   Problem: Clinical Measurements: Goal: Diagnostic test results will improve Outcome: Progressing   Problem: Clinical Measurements: Goal: Respiratory complications will improve Outcome: Progressing   Problem: Coping: Goal: Level of anxiety will decrease Outcome: Progressing   Problem: Elimination: Goal: Will not experience complications related to bowel motility Outcome: Progressing
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
## 2024-03-10 NOTE — Care Management Obs Status (Signed)
 MEDICARE OBSERVATION STATUS NOTIFICATION   Patient Details  Name: Jacob Adams MRN: [id number] Date of Birth: [birth year]   Medicare Observation Status Notification Given:  Yes    Joanette Moynahan, RN 03/10/2024, 10:16 AM

## 2024-03-10 NOTE — ED Provider Notes (Signed)
 [place] EMERGENCY DEPARTMENT AT [facility] Provider Note   CSN: [id number] Arrival date & time: 03/10/24  7829     History  Chief Complaint  Patient presents with   Hypotension   Diarrhea   Loss of Consciousness    Jacob Adams is a 58 y.o. male.  The history is provided by the patient and medical records.  Diarrhea Loss of Consciousness  58 y.o. M with hx of alcohol abuse, HTN, CHF, fatty liver, anemia, hx of GI bleeding, ulcerative colitis, presenting to the ED for diarrhea and multiple syncopal events at home.  Patient reports nearly constant diarrhea since 2200 at home.  Apparently passed out multiple times which he recalls.  Family found him in the floor.  Hypotensive with EMS.  States some GI bleeding last week that appeared "red" in color but that seems to have resolved.  Does have hx of GI bleeding in the past.  Not currently on anticoagulation.  Home Medications Prior to Admission medications   Medication Sig Start Date End Date Taking? Authorizing Provider  docusate sodium  (COLACE) 100 MG capsule Take 1 capsule (100 mg total) by mouth daily as needed. 02/18/24 02/17/25  Sandie Cross, PA-C  methocarbamol  (ROBAXIN -750) 750 MG tablet Take 1 tablet (750 mg total) by mouth 3 (three) times daily as needed for muscle spasms. 02/18/24   Sandie Cross, PA-C  ondansetron  (ZOFRAN ) 4 MG tablet Take 1 tablet (4 mg total) by mouth every 8 (eight) hours as needed for nausea or vomiting. 02/18/24   Sandie Cross, PA-C  oxyCODONE -acetaminophen  (PERCOCET) 5-325 MG tablet Take 1-2 tablets by mouth every 6 (six) hours as needed. To be taken after surgery 02/18/24   Sandie Cross, PA-C  albuterol  (VENTOLIN  HFA) 108 (90 Base) MCG/ACT inhaler Inhale 2 puffs into the lungs every 6 (six) hours as needed for wheezing or shortness of breath.    [provider]  atorvastatin  (LIPITOR) 40 MG tablet Take 40 mg by mouth daily.    [provider]  atorvastatin  (LIPITOR)  80 MG tablet Take 1 tablet (80 mg total) by mouth at bedtime. Patient not taking: Reported on 02/15/2024 01/06/24   Silas Drivers, MD  Calcium  Carb-Cholecalciferol  (CALTRATE 600+D3 PO) Take 1 tablet by mouth in the morning.    [provider]  carvedilol  (COREG ) 6.25 MG tablet Take 1 tablet (6.25 mg total) by mouth 2 (two) times daily. 01/06/24   Silas Drivers, MD  DULoxetine  (CYMBALTA ) 30 MG capsule Take 1 capsule (30 mg total) by mouth daily AND 2 capsules (60 mg total) at bedtime. Patient taking differently: Take 1 capsule (30mg ) by mouth twice daily. 01/06/24 02/15/27  Silas Drivers, MD  FARXIGA  10 MG TABS tablet Take 10 mg by mouth daily. 11/24/23   [provider]  folic acid  (FOLVITE ) 1 MG tablet Take 1 tablet (1 mg total) by mouth daily. 01/18/24   Lavada Porteous, DO  Galcanezumab -gnlm (EMGALITY ) 120 MG/ML SOAJ Inject 120 mg into the skin See admin instructions. Inject 120mg  subcutaneously once a month, between the 1st and 3rd.    [provider]  losartan  (COZAAR ) 50 MG tablet Take 50 mg by mouth daily.    [provider]  MAGNESIUM  PO Take 1 capsule by mouth every evening.    [provider]  mesalamine  (LIALDA ) 1.2 g EC tablet Take 4.8 g by mouth daily with breakfast.    [provider]  metFORMIN (GLUCOPHAGE) 500 MG tablet Take 500 mg by  mouth daily.    [provider]  Multiple Vitamin (MULTIVITAMIN WITH MINERALS) TABS tablet Take 1 tablet by mouth daily. 01/18/24   Lavada Porteous, DO  naltrexone  (DEPADE) 50 MG tablet Take 50 mg by mouth in the morning.    [provider]  pantoprazole  (PROTONIX ) 40 MG tablet Take 1 tablet (40 mg total) by mouth daily. Patient not taking: Reported on 02/15/2024 01/02/24 03/02/24  Ozell Blunt, MD  polyethylene glycol powder (GLYCOLAX /MIRALAX ) 17 GM/SCOOP powder Take 17 g by mouth daily as needed for mild constipation. 01/18/24   Lavada Porteous, DO  QUEtiapine  (SEROQUEL ) 400 MG  tablet Take 200 mg by mouth at bedtime.    [provider]  rifaximin  (XIFAXAN ) 550 MG TABS tablet Take 550 mg by mouth 2 (two) times daily.    [provider]  sacubitril -valsartan  (ENTRESTO ) 49-51 MG Take 1 tablet by mouth 2 (two) times daily. 01/07/24   Silas Drivers, MD  spironolactone  (ALDACTONE ) 25 MG tablet Take 1 tablet (25 mg total) by mouth daily. 01/07/24   Silas Drivers, MD  thiamine  (VITAMIN B1) 100 MG tablet Take 1 tablet (100 mg total) by mouth daily. 01/18/24   Lavada Porteous, DO  Ubrogepant  (UBRELVY ) 50 MG TABS Take 1 tablet by mouth daily as needed.    [provider]      Allergies    Nsaids, Motrin  [ibuprofen ], Pork (diagnostic), Sunscreens, Penicillins, and [name]    Review of Systems   Review of Systems  Cardiovascular:  Positive for syncope.  Gastrointestinal:  Positive for diarrhea.  All other systems reviewed and are negative.   Physical Exam Updated Vital Signs BP 97/64   Pulse 87   Temp 97.8 F (36.6 C) (Oral)   Resp 19   Ht 5\' 10"  (1.778 m)   Wt 106.6 kg   SpO2 92%   BMI 33.72 kg/m   Physical Exam Vitals and nursing note reviewed.  Constitutional:      Appearance: He is well-developed.  HENT:     Head: Normocephalic and atraumatic.     Comments: No visible head trauma Eyes:     Conjunctiva/sclera: Conjunctivae normal.     Pupils: Pupils are equal, round, and reactive to light.  Cardiovascular:     Rate and Rhythm: Normal rate and regular rhythm.     Heart sounds: Normal heart sounds.  Pulmonary:     Effort: Pulmonary effort is normal.     Breath sounds: Normal breath sounds.  Abdominal:     General: Bowel sounds are normal.     Palpations: Abdomen is soft.  Genitourinary:    Comments: Exam chaperoned by RN's Whitney and Carmen-- loose brown stool noted surrounding rectum, no gross blood Musculoskeletal:        General: Normal range of motion.     Cervical back: Normal range of motion.   Skin:    General: Skin is warm and dry.  Neurological:     Mental Status: He is alert and oriented to person, place, and time.     Comments: AAOx3, able to answer questions and follow commands, no focal deficit     ED Results / Procedures / Treatments   Labs (all labs ordered are listed, but only abnormal results are displayed) Labs Reviewed  COMPREHENSIVE METABOLIC PANEL WITH GFR - Abnormal; Notable for the following components:      Result Value   Sodium 128 (*)    CO2 9 (*)    Glucose, Bld 66 (*)  Creatinine, Ser 0.55 (*)    Calcium  6.8 (*)    Total Protein <3.0 (*)    Albumin <1.5 (*)    AST 10 (*)    Alkaline Phosphatase 28 (*)    Anion gap 17 (*)    All other components within normal limits  CBC - Abnormal; Notable for the following components:   WBC 2.8 (*)    RBC 1.87 (*)    Hemoglobin 5.9 (*)    HCT 18.0 (*)    Platelets 131 (*)    All other components within normal limits  CBC WITH DIFFERENTIAL/PLATELET - Abnormal; Notable for the following components:   RBC 3.60 (*)    Hemoglobin 11.5 (*)    HCT 32.6 (*)    Monocytes Absolute 1.1 (*)    All other components within normal limits  CBG MONITORING, ED - Abnormal; Notable for the following components:   Glucose-Capillary 118 (*)    All other components within normal limits  I-STAT CHEM 8, ED - Abnormal; Notable for the following components:   Sodium 117 (*)    Chloride 97 (*)    Creatinine, Ser 1.40 (*)    Glucose, Bld 118 (*)    Calcium , Ion 0.71 (*)    TCO2 19 (*)    Hemoglobin 12.2 (*)    HCT 36.0 (*)    All other components within normal limits  BRAIN NATRIURETIC PEPTIDE  ETHANOL  LIPASE, BLOOD  URINALYSIS, ROUTINE W REFLEX MICROSCOPIC  COMPREHENSIVE METABOLIC PANEL WITH GFR  POC OCCULT BLOOD, ED  I-STAT CG4 LACTIC ACID, ED  TYPE AND SCREEN  PREPARE RBC (CROSSMATCH)    EKG None  Radiology CT ABDOMEN PELVIS W CONTRAST Result Date: 03/10/2024 CLINICAL DATA:  Diarrhea.  Hypotension. EXAM:  CT ABDOMEN AND PELVIS WITH CONTRAST TECHNIQUE: Multidetector CT imaging of the abdomen and pelvis was performed using the standard protocol following bolus administration of intravenous contrast. RADIATION DOSE REDUCTION: This exam was performed according to the departmental dose-optimization program which includes automated exposure control, adjustment of the mA and/or kV according to patient size and/or use of iterative reconstruction technique. CONTRAST:  75mL OMNIPAQUE  IOHEXOL  350 MG/ML SOLN COMPARISON:  10/14/2022 FINDINGS: Lower chest: Mild dependent changes along the lung bases. No significant pleural effusion or consolidative change. Hepatobiliary: No focal liver abnormality is seen. No gallstones, gallbladder wall thickening, or biliary dilatation. Pancreas: Unremarkable. No pancreatic ductal dilatation or surrounding inflammatory changes. Spleen: Normal in size without focal abnormality. Adrenals/Urinary Tract: Normal adrenal glands. Bilateral kidney cysts. The largest is off the upper pole of left kidney measuring 2 cm. No follow-up imaging recommended. No nephrolithiasis or hydronephrosis. Urinary bladder appears normal. Stomach/Bowel: Stomach is within normal limits. No signs of acute appendicitis. No pathologic dilatation of the large or small bowel loops. No significant bowel wall thickening or inflammation. Vascular/Lymphatic: No significant vascular findings are present. No enlarged abdominal or pelvic lymph nodes. Reproductive: Prostate is unremarkable. Other: No ascites or focal fluid collections. Fat containing, midline ventral abdominal wall hernia just above the umbilicus measures 3.5 cm. No signs of free air. Musculoskeletal: No acute or significant osseous findings. Lumbar degenerative disc disease. IMPRESSION: 1. No acute findings within the abdomen or pelvis. 2. Fat containing, midline ventral abdominal wall hernia just above the umbilicus measures 3.5 cm. Electronically Signed   By: Kimberley Penman M.D.   On: 03/10/2024 06:03    Procedures Procedures    CRITICAL CARE Performed by: Coretha Dew   Total critical care time:  45 minutes  Critical care time was exclusive of separately billable procedures and treating other patients.  Critical care was necessary to treat or prevent imminent or life-threatening deterioration.  Critical care was time spent personally by me on the following activities: development of treatment plan with patient and/or surrogate as well as nursing, discussions with consultants, evaluation of patient's response to treatment, examination of patient, obtaining history from patient or surrogate, ordering and performing treatments and interventions, ordering and review of laboratory studies, ordering and review of radiographic studies, pulse oximetry and re-evaluation of patient's condition.   Medications Ordered in ED Medications  0.9 %  sodium chloride  infusion (Manually program via Guardrails IV Fluids) ( Intravenous Not Given 03/10/24 0625)  lactated ringers  bolus 1,000 mL (0 mLs Intravenous Stopped 03/10/24 0622)  iohexol  (OMNIPAQUE ) 350 MG/ML injection 75 mL (75 mLs Intravenous Contrast Given 03/10/24 0545)    ED Course/ Medical Decision Making/ A&P                                 Medical Decision Making Amount and/or Complexity of Data Reviewed Labs: ordered. Radiology: ordered and independent interpretation performed. ECG/medicine tests: ordered and independent interpretation performed.  Risk Prescription drug management.   58 y.o. M here with diarrhea and multiple syncopal events.  Reports some bloody stools last week but denies this currently.  Denies any vomiting.  No significant abdominal pain.    Afebrile, non-toxic.  He does appear weak.  BP in the 80's on initial evaluation.  Started IVF.  Rectal exam with loose, brown stool noted on DRE.  Hemoccult negative today.  Will check labs, CT.  Labs with hemoglobin of 5.9.  Does  report bloody stools last week.  Type and screen, 2 units PRBC's ordered.  5:11 AM CMP grossly abnormal.  Labs were drawn while IVF from EMS running so suspect may have been diluted with saline.  Repeat chem-8 sent with straight stick-- hemoglobin 12.2, Na+ 117.  Will redraw CBC and CMP to ensure accuracy.  BP currently 101/60.  Holding on blood until labs confirmed.  6:16 AM Repeat CBC with hemoglobin 11.5.  this appears more consistent with his baseline.    CT without acute findings, does have noted hernia without obstructive findings.  6:44 AM Repeat CMP is pending to verify if truly hyponatremic or not.  Suspect he will need admission.  Care signed out to oncoming provider.  Final Clinical Impression(s) / ED Diagnoses Final diagnoses:  Syncope and collapse  Anemia, unspecified type  Diarrhea, unspecified type    Rx / DC Orders ED Discharge Orders     None         Coretha Dew, PA-C 03/10/24 1610    Maralee Senate, April, MD 03/10/24 [phone]

## 2024-03-10 NOTE — ED Triage Notes (Signed)
 Pt BIB [facility] from home. Abdominal pain and diarrhea since 2200 last night. Pt passed out multiple times,family found him on the floor,  Unsure if he hit his head. GCS 15.   74/48 BP 96% RA 70P 700 LR 20 R Hand

## 2024-03-11 ENCOUNTER — Inpatient Hospital Stay (HOSPITAL_COMMUNITY)

## 2024-03-11 DIAGNOSIS — E871 Hypo-osmolality and hyponatremia: Secondary | ICD-10-CM | POA: Diagnosis not present

## 2024-03-11 DIAGNOSIS — F1020 Alcohol dependence, uncomplicated: Secondary | ICD-10-CM

## 2024-03-11 DIAGNOSIS — R55 Syncope and collapse: Secondary | ICD-10-CM

## 2024-03-11 LAB — CBC
HCT: 32 % — ABNORMAL LOW (ref 39.0–52.0)
Hemoglobin: 10.9 g/dL — ABNORMAL LOW (ref 13.0–17.0)
MCH: 31.4 pg (ref 26.0–34.0)
MCHC: 34.1 g/dL (ref 30.0–36.0)
MCV: 92.2 fL (ref 80.0–100.0)
Platelets: 274 10*3/uL (ref 150–400)
RBC: 3.47 MIL/uL — ABNORMAL LOW (ref 4.22–5.81)
RDW: 15.8 % — ABNORMAL HIGH (ref 11.5–15.5)
WBC: 5.2 10*3/uL (ref 4.0–10.5)
nRBC: 0 % (ref 0.0–0.2)

## 2024-03-11 LAB — BASIC METABOLIC PANEL WITH GFR
Anion gap: 6 (ref 5–15)
BUN: 15 mg/dL (ref 6–20)
CO2: 22 mmol/L (ref 22–32)
Calcium: 8.3 mg/dL — ABNORMAL LOW (ref 8.9–10.3)
Chloride: 100 mmol/L (ref 98–111)
Creatinine, Ser: 1.61 mg/dL — ABNORMAL HIGH (ref 0.61–1.24)
GFR, Estimated: 49 mL/min — ABNORMAL LOW (ref >60)
Glucose, Bld: 117 mg/dL — ABNORMAL HIGH (ref 70–99)
Potassium: 4.7 mmol/L (ref 3.5–5.1)
Sodium: 128 mmol/L — ABNORMAL LOW (ref 135–145)

## 2024-03-11 LAB — SODIUM, URINE, RANDOM: Sodium, Ur: 25 mmol/L

## 2024-03-11 LAB — OSMOLALITY, URINE: Osmolality, Ur: 149 mosm/kg — ABNORMAL LOW (ref 300–900)

## 2024-03-11 LAB — CHLORIDE, URINE, RANDOM: Chloride Urine: 30 mmol/L

## 2024-03-11 LAB — CREATININE, URINE, RANDOM: Creatinine, Urine: 45 mg/dL

## 2024-03-11 MED ORDER — BUTALBITAL-APAP-CAFFEINE 50-325-40 MG PO TABS
1.0000 | ORAL_TABLET | Freq: Four times a day (QID) | ORAL | Status: DC | PRN
  Administered 2024-03-11 – 2024-03-13 (×5): 1 via ORAL
  Filled 2024-03-11 (×6): qty 1

## 2024-03-11 MED ORDER — BUTALBITAL-APAP-CAFFEINE 50-325-40 MG PO TABS
1.0000 | ORAL_TABLET | ORAL | Status: AC
Start: 2024-03-11 — End: 2024-03-11
  Administered 2024-03-11: 1 via ORAL
  Filled 2024-03-11: qty 1

## 2024-03-11 MED ORDER — LATANOPROST 0.005 % OP SOLN
1.0000 [drp] | Freq: Every day | OPHTHALMIC | Status: DC
Start: 2024-03-11 — End: 2024-03-13
  Administered 2024-03-12: 1 [drp] via OPHTHALMIC
  Filled 2024-03-11: qty 2.5

## 2024-03-11 NOTE — Evaluation (Signed)
 Physical Therapy Brief Evaluation and Discharge Note Patient Details Name: Jacob Adams MRN: [id number] DOB: 1966/03/03 Today's Date: 03/11/2024   History of Present Illness  Pt is a 58 year old man admitted on 03/10/24 after syncope secondary to hypotension in the setting of decreased oral intake, diarrhea and abdominal pain x several days. Pt also + for hyponatremia. PMH: alcohol abuse, CHF, HTN, GIB, migraines, pancreatitis, arthritis in knees.  Clinical Impression  PTA pt lives with children and grandchildren with home with basement and bed and bath on first level. Pt is independent with ambulation, ADLs and iADLs regularly takes care of his grandchildren. Pt is independent with med mobility and transfers and supervision for ambulation due to syncope prior to coming to ED. Pt has support needed at home and has no further PT needs. Pt referred to Mobility Specialist. PT signing off.        PT Assessment Patient does not need any further PT services  Assistance Needed at Discharge  PRN    Equipment Recommendations None recommended by PT     Precautions/Restrictions Precautions Precautions: None Restrictions Weight Bearing Restrictions Per Provider Order: No        Mobility  Bed Mobility   Supine/Sidelying to sit: Independent      Transfers  Sit to stand: Independent                      Ambulation/Gait Ambulation/Gait assistance: Supervision Gait Distance (Feet): 60 Feet Assistive device: None Gait Pattern/deviations: Step-through pattern, WFL(Within Functional Limits) Gait Speed: Below normal General Gait Details: slowed steady gait  Home Activity Instructions    Stairs            Modified Rankin (Stroke Patients Only)        Balance  WNL                        Pertinent Vitals/Pain   Pain Assessment Pain Assessment: 0-10 Pain Score: 10-Worst pain ever Pain Location: L sided headache (light sensitive,) Pain Descriptors /  Indicators: Headache Pain Intervention(s): Limited activity within patient's tolerance     Home Living Family/patient expects to be discharged to:: Private residence Living Arrangements: Children (and grandchildren) Available Help at Discharge: Family;Available PRN/intermittently Home Environment: Stairs to enter  Stairs-Number of Steps: 1 Home Equipment: None        Prior Function Level of Independence: Independent      UE/LE Assessment   UE ROM/Strength/Tone/Coordination: WFL    LE ROM/Strength/Tone/Coordination: [facility]      Communication   Communication Communication: No apparent difficulties     Cognition Overall Cognitive Status: Appears within functional limits for tasks assessed/performed       General Comments General comments (skin integrity, edema, etc.): BP supine 116/79, sitting 115/84, standing 102/81, standing 3 min 110/87, seated after ambulation 114/82        Assessment/Plan       PT Visit Diagnosis Pain    No Skilled PT Patient at baseline level of functioning;All education completed;Patient will have necessary level of assist by caregiver at discharge;Patient is supervision for all activity/mobility    AMPAC 6 Clicks Help needed turning from your back to your side while in a flat bed without using bedrails?: None Help needed moving from lying on your back to sitting on the side of a flat bed without using bedrails?: None Help needed moving to and from a bed to a chair (including a wheelchair)?: None Help  needed standing up from a chair using your arms (e.g., wheelchair or bedside chair)?: None Help needed to walk in hospital room?: None Help needed climbing 3-5 steps with a railing? : None 6 Click Score: 24      End of Session   Activity Tolerance: Patient tolerated treatment well Patient left: in chair;with call bell/phone within reach;Other (comment) (lights off due to headache) Nurse Communication: Mobility status PT Visit Diagnosis:  Pain Pain - part of body:  (headache)     Time: 4098-1191 PT Time Calculation (min) (ACUTE ONLY): 16 min  Charges:   PT Evaluation $PT Eval Low Complexity: 1 Low      Josi Roediger B. Jewel Mortimer PT, DPT Acute Rehabilitation Services Please use secure chat or  Call Office [phone]   Verlie Glisson [facility]  03/11/2024, 11:50 AM

## 2024-03-11 NOTE — Evaluation (Signed)
 Occupational Therapy Evaluation and Discharge Patient Details Name: Jacob Adams MRN: [id number] DOB: 1966-04-01 Today's Date: 03/11/2024   History of Present Illness   Pt is a 58 year old man admitted on 03/10/24 after syncope secondary to hypotension in the setting of decreased oral intake, diarrhea and abdominal pain x several days. Pt also + for hyponatremia. PMH: alcohol abuse, CHF, HTN, GIB, migraines, pancreatitis, arthritis in knees.     Clinical Impressions Pt is typically independent. His son, daughter in law and grandchildren live with him and he helps care for the children. Pt is functioning at a set up to supervision level (due to recent syncopal episode). He is ambulating steadily within his room. He reports a headache with photophobia. No further OT needs, recommend ADLs with nursing staff.      If plan is discharge home, recommend the following:         Functional Status Assessment   Patient has not had a recent decline in their functional status     Equipment Recommendations   None recommended by OT     Recommendations for Other Services         Precautions/Restrictions   Precautions Precautions: None Recall of Precautions/Restrictions: Intact Restrictions Weight Bearing Restrictions Per Provider Order: No     Mobility Bed Mobility Overal bed mobility: Modified Independent                  Transfers Overall transfer level: Needs assistance Equipment used: None Transfers: Sit to/from Stand Sit to Stand: Supervision           General transfer comment: supervision due to hx of hypotension and first time OOB      Balance Overall balance assessment: Needs assistance   Sitting balance-Leahy Scale: Good       Standing balance-Leahy Scale: Good                             ADL either performed or assessed with clinical judgement   ADL                                         General ADL  Comments: set up to supervision for safety     Vision Ability to See in Adequate Light: 0 Adequate Patient Visual Report: No change from baseline       Perception         Praxis         Pertinent Vitals/Pain Pain Assessment Pain Assessment: 0-10 Pain Score: 10-Worst pain ever Pain Location: head Pain Descriptors / Indicators: Headache Pain Intervention(s): Premedicated before session, Monitored during session     Extremity/Trunk Assessment Upper Extremity Assessment Upper Extremity Assessment: Overall WFL for tasks assessed   Lower Extremity Assessment Lower Extremity Assessment: Defer to PT evaluation   Cervical / Trunk Assessment Cervical / Trunk Assessment: Normal   Communication Communication Communication: No apparent difficulties   Cognition Arousal: Alert Behavior During Therapy: WFL for tasks assessed/performed Cognition: No apparent impairments                               Following commands: Intact       Cueing  General Comments   Cueing Techniques: Verbal cues      Exercises     Shoulder Instructions  Home Living Family/patient expects to be discharged to:: Private residence Living Arrangements: Children (and grandchildren) Available Help at Discharge: Family;Available 24 hours/day Type of Home: House Home Access: Stairs to enter [employer] of Steps: 1   Home Layout: One level;Laundry or work area in basement;Able to live on main level with bedroom/bathroom     Bathroom Shower/Tub: Producer, television/film/video: Pharmacist, community: Yes   Home Equipment: Other (comment);Crutches          Prior Functioning/Environment Prior Level of Function : Independent/Modified Independent;Driving                    OT Problem List:     OT Treatment/Interventions:        OT Goals(Current goals can be found in the care plan section)       OT Frequency:       Co-evaluation               AM-PAC OT "6 Clicks" Daily Activity     Outcome Measure Help from another person eating meals?: None Help from another person taking care of personal grooming?: None Help from another person toileting, which includes using toliet, bedpan, or urinal?: None Help from another person bathing (including washing, rinsing, drying)?: None Help from another person to put on and taking off regular upper body clothing?: None Help from another person to put on and taking off regular lower body clothing?: None 6 Click Score: 24   End of Session    Activity Tolerance: Patient tolerated treatment well Patient left: in chair;with call bell/phone within reach  OT Visit Diagnosis: Dizziness and giddiness (R42)                Time: 5784-6962 OT Time Calculation (min): 12 min Charges:  OT General Charges $OT Visit: 1 Visit OT Evaluation $OT Eval Low Complexity: 1 Low  Avanell Leigh, OTR/L Acute Rehabilitation Services Office: [phone]   Jonette Nestle 03/11/2024, 11:35 AM

## 2024-03-11 NOTE — Progress Notes (Addendum)
 HD#1 SUBJECTIVE:  Patient Summary: Jacob Adams is is a 58 yo male with alcohol use disorder, ulcerative colitis, CHF (EF 40-45%), asthma, hypertension, and migraines who presents with syncope and hyponatremia admitted for syncopal episodes and hypotension.   Overnight Events: No major overnight events.     Interm History: Patient does not endorse any abdominal pain, nausea, or vomitting. Patient also has not had a bowel movement yet today. Patient has not tried to stand up, but does endorse lightheadedness and slight dizziness. Patient also endorses dysuria with color of urine looking darker than normal. Patient does not endorse hematuria. Patient also endorses some irritation, trembling, and diaphoresis. Also reports intense headache that has worsened since yesterday, denies vision changes.   OBJECTIVE:  Vital Signs: Vitals:   03/11/24 0037 03/11/24 0423 03/11/24 0739 03/11/24 1107  BP: 132/60 101/72 111/75 114/83  Pulse: 84 86 86 73  Resp: 19 18 19 19   Temp: 98.6 F (37 C) 98.2 F (36.8 C) 98 F (36.7 C) 98 F (36.7 C)  TempSrc: Oral Oral Oral Oral  SpO2: 96% 99% 100% 100%  Weight:  107.3 kg    Height:       Supplemental O2: Room Air SpO2: 100 %  Filed Weights   03/10/24 0334 03/10/24 1815 03/11/24 0423  Weight: 106.6 kg 107.4 kg 107.3 kg     Intake/Output Summary (Last 24 hours) at 03/11/2024 1315 Last data filed at 03/11/2024 0850 Gross per 24 hour  Intake 1960 ml  Output 2000 ml  Net -40 ml   Net IO Since Admission: 1,535 mL [03/11/24 1315]  Physical Exam: Constitutional: appears well, NAD HENT: +dizziness with EOMI  Cardiovascular: regular rate and rhythm, no m/r/g Pulmonary/Chest: normal work of breathing on room air  Abdominal: soft, non distended, normal bowel sounds  Neurological: alert & oriented x 3 Psych: normal mood and affect   Patient Lines/Drains/Airways Status     Active Line/Drains/Airways     Name Placement date Placement time Site  Days   Peripheral IV 03/10/24 20 G Left;Posterior Hand 03/10/24  0337  Hand  1   Peripheral IV 03/10/24 20 G Left Antecubital 03/10/24  0445  Antecubital  1            Pertinent Labs:    Latest Ref Rng & Units 03/11/2024    3:20 AM 03/10/2024    6:59 AM 03/10/2024    5:05 AM  CBC  WBC 4.0 - 10.5 K/uL 5.2     Hemoglobin 13.0 - 17.0 g/dL 09.8  11.9  14.7   Hematocrit 39.0 - 52.0 % 32.0  35.0  36.0   Platelets 150 - 400 K/uL 274          Latest Ref Rng & Units 03/11/2024    3:20 AM 03/10/2024    9:23 PM 03/10/2024   12:00 PM  CMP  Glucose 70 - 99 mg/dL 829  562  130   BUN 6 - 20 mg/dL 15  14  12    Creatinine 0.61 - 1.24 mg/dL 8.65  7.84  6.96   Sodium 135 - 145 mmol/L 128  124  123   Potassium 3.5 - 5.1 mmol/L 4.7  4.7  5.5   Chloride 98 - 111 mmol/L 100  95  95   CO2 22 - 32 mmol/L 22  19  21    Calcium  8.9 - 10.3 mg/dL 8.3  8.2  8.6     Recent Labs    03/10/24 0451  GLUCAP 118*     Pertinent Imaging: No results found.  ASSESSMENT/PLAN:  Assessment: Principal Problem:   Syncope Active Problems:   Alcohol use disorder, severe, dependence (HCC)   Hyponatremia   Diarrhea  Patient Summary Jacob Adams is is a 58 yo male with alcohol use disorder, ulcerative colitis, CHF (EF 40-45%), asthma, hypertension, and migraines who presents with syncope and hyponatremia admitted for syncopal episodes and hypotension on hospital day 1.  Plan:  Syncope  Presented with prodrome of dizziness and then remembers waking up on the ground. He is unsure if he hit his head upon LOC. His syncopal episode sounds orthostatic in nature, likely secondary to volume depletion in the setting of poor po intake, continued alcohol use, GI losses (diarrhea), and anti-htn medications. He received 2 L IVF in the ED and another 1L once admitted. On exam, he reports mild dizziness and lightheadedness. Telemetry wo acute arrhythmias.  - Follow up orthostatic vitals  - CT head wo contrast  - Encourage  PO intake  - PT/OT recs    AKI with baseline Scr 1.01  Hyponatremia  Hyperkalemia - resolved  S/p 3L IVF, AO x 4. Likely due to underlying hypovolemia, decrease PO intake, alcohol abuse and GI losses. CT a/p with bilateral kidney cysts, no nephrolithiasis or hydronephrosis. Normal Urinary bladder. Denies any current nausea/vomiting/diarrhea. Denies any abdominal pain. Denies dysuria or urinary symptoms. - UA negative nitrites and leukocytes - Na 128 (124), improving. K 4.7 resolved  - Scr 1.61 (1.61), peaked  - Follow-up urine lytes, Na, Cr, Cl - Follow up urine osmolality    Alcohol use disorder Drinks very heavily- noting that he has a few 40 ounce beers a day, splits a 15 pack of beer with his friend, and also drinks some liqour. He does have a history of alcohol withdrawal, reports previous hx of seizures. States his last drink was the evening of 5/21, reaching 48 hours.. Required only one dose Ativan  1 mg on 5/22. CIWA scores 0-2 this morning  - CIWA with ativan , monitor for withdrawals - Continue folic acid  1 mg daily, thiamine  100 mg daily, multivitamins. - Alcohol cessation counseling provided   Ulcerative colitis Patient reports diarrhea with bright red blood noting for the past couple of weeks, CT a/p without acute colitis.  On exam today, patient denies any nausea, vomiting, diarrhea. Colonoscopy 7/23 showed nonbleeding nonthrombosed external/internal hemorrhoids.  Patient does report that prior to his diarrheal events he was constipated and straining. - ESR 30, CRP 0.8 -Continue home medication mesalamine  4.8 g daily with breakfast -Continue home medication rifaximin  550 mg twice daily   Migraines Has hx of migraines and takes Emgality  once a month, in addition to PRN Ubrelvy . He has needed to take Ubrelvy  more regularly now. headache is improved with putting a towel behind his head. - PRN Fioricet  q6h for migraine - Tylenol  PRN for mild pain - Decrease triggers that would  make migraine worse    Combined systolic and diastolic CHF Last echo in march with EF of 40-45%, LV global hypokinesis, and grade I diastolic dysfunction. - Hold home entresto  and farxiga  given hypotension  - Resumed home coreg  6.25 mg BID   Best Practice: Diet: Regular diet IVF: Fluids: None,  VTE: rivaroxaban (XARELTO) tablet 10 mg Start: 03/10/24 1700 Code: Full AB: None  Therapy Recs: None  DISPO: Anticipated discharge likely tomorrow pending medical management  Signature: Cody Das, Medical Student   Please contact the on call pager after 5 pm  and on weekends at [phone].   Attestation for Student Documentation:  I personally was present and re-performed the history, physical exam and medical decision-making activities of this service and have verified that the service and findings are accurately documented in the student's note.  Lanney Pitts, DO 03/11/2024, 1:15 PM

## 2024-03-11 NOTE — Discharge Instructions (Signed)
 In a time of Crisis:   - [facility]   Mobile Crisis Management provides immediate crisis response, 24/7.   Call [phone]   [facility] for MH/DD/SA Services?Access Center is available 24 hours a day, 7 days a week. Customer Service Specialists will assist you to find a crisis provider that is well-matched with your needs. Your local number is:?[phone]   - [facility]/Behavioral Health Urgent Care [facility])  OP, individual counseling, medication management  [address] [place], [place] [address]  ; [phone]  Call for intake hours; Medicaid and Uninsured     Outpatient Providers    [facility] ([facility])  Group and individual counseling.  [address]   [place], [place] [address]  [phone]  [place]: [phone]  [place]: [phone]  Medicaid and uninsured.     [facility]  Offers IOP groups multiple times per week.  [address] [place], [place] [address]  [phone]  Takes Medicaid and other insurances.     [facility]   Chemical Dependency Intensive Outpatient Program (IOP)  [address]  [place], [place] [address]  [phone]  Takes Nurse, learning disability and [place].     [facility]   IOP and Partial Hospitalization Program   [address]   [place], [place] [address]  [phone]  Private Insurance, [place] only for partial hospitalization    [facility]  [address], [address], [place], [place] [address]  [phone]  Monday-Friday. Short and Long term options.  [facility]gent Care ([facility])  IOP, individual counseling, medication management  [address] [place], [place] [address]  [phone]  Medicaid and [facility] - Day One Residence    [facility]  [address]   [place], [place] [address]  [phone]  Private Insurance and Self Pay      [facility]  [address]   [place], [place] [address]  [phone]  Private Insurance, [place], and Self Pay     [facility]   [address] [place], [place] [address]  [facility]   [address]   [place], [place] [address]  [phone]    [facility]   [address]  [place], [place] [address]  [phone]          Residential Treatment Programs    [facility] ([facility]  [address]  [place], [place] [address]  [phone] or [phone]  Detox and Residential Rehab 21 days  (Medicaid, private insurance, and self pay. If Medicare, will look into funding). No methadone. Call for pre-screen.     [facility])  [address]   [place], [place] [address]  [phone]  Detox 3-7 days (self Pay and Medicaid Limited availability). Transitional Program for females needs 60 days clean first.   Rehab Only for Males (Medicare, Medicaid, and Self Pay)-No methadone.    [facility] [address]  [address]  [place], [place] [address]  [phone] or [phone]  Private Insurance only    [facility]  PHONE:?[phone]  FAX:?[phone]  Residential program for women 21 and over for up to a year through a Christian 12-step recovery model.  Self-pay.      [facility]  [address]  [place], [place]  [address]  Phone: ?[phone]  Must be detoxed 72 hours prior to admission; 28 day program.   Self-pay.    [facility]  [address]   [place], [place]  [phone]  [employer], Medicare, [place] (not straight [place]). They offer assistance with transportation.     [facility]  [address] [place],   [place], [place] [address]  [phone]  Christian Based Program. Men only.  No insurance    [facility] is a substance use disorder treatment program for women,  including those who are pregnant, parenting, and/or whose lives have been touched by abuse and violence.  [phone]    [facility]  [address] [place], [place] [address]  Women's: [phone]  Men's: [phone]  No Medicaid.     [facility] [place]  Locations across the [place]mainly [place] willing to help with transportation.   [phone] [employer].  [facility]   [address]   [place], [place] [address]  [phone]  Treatment Only, must make assessment appointment, and must be sober for assessment appointment. Self pay, [facility], must be [facility] resident. No methadone.     [facility]   [address]  [place], [place] [address]  [phone]  No pending legal charges, Long-term work program. No methadone. Call for assessment.    [facility]   [address],  [place], [place] [address]  [phone] or [phone]  Commercial Insurance Only    [facility]  Local - [phone]  [phone]  Private Insurance (no [place]).  Males/Females, call to make referrals, multiple facilities.     [facility]  [address],   [place], [place] [address]  ?([place]  Men Only Upfront Fee  [facility]  Women's Program: [facility]  [address]  [place], [place] [address]  [phone]    [facility]-no methadone:  [facility]  [address]  [place], [place] [address]  [phone] (f)    [facility]  [address] [place], [place] [address]  [phone] [facility]  [phone]  [place], [place]  For women, houses 8 residents for sober living. No Medicaid.               AA Meetings   Meeting Locator:  [email or web address]  Also can download an app on that website.     Syringe Services Program   Due to COVID-19, syringe services programs are likely operating under different hours with limited or no fixed site hours. Some programs may not be operating at all. Please contact the program directly using the phone numbers provided below to see if they are still operating under COVID-19.   [facility] Solution to the Opioid Problem (GCSTOP)  Fixed; mobile; peer-based  Roxy Cedar  [phone]  [email or web address]   Fixed site exchange at [facility], [address]  [place], [place] [address] on Wednesdays (2:00 - 5:00 pm) and Thursdays (4:00 - 8:00 pm).  Pop-up mobile exchange locations:  [employer], [address] [place], [place] [address] on Tuesdays (11:00 am - 1:00 pm) and Fridays (11:00 am - 1:00 pm)   -[facility]?-?[address], [place], [place] [address] on Tuesdays (2:00 - 4:00 pm) and Fridays (  2:00 - 4:00 pm)   -[facility]?- also serves [place] and [employer]  [facility] [employer]  Fixed; mobile; peer-based  Lendon Ka  [phone]  [email or web address]  [address] [place], [place] [address]  Delivery and outreach available in [place] and [place], please call for more information. Monday, Tuesday: 1:00 -7:00 pm  Thursday: 4:00 pm - 8:00 pm  Friday: 1:00 pm - 8:00 pm)   Medication-Assisted Treatment (MAT)   -[facility]- services [address] and surrounding areas including [place], [place], [place], [address], [place], [place], [place], [place], and [place], [place].   Options include Methadone, buprenorphine or Suboxone.   [address], [place] [address]  [place], [place] [address]  Phone: [phone]   Mon - Fri: 5:30am - 2:00pm  Sat: 5:30am -7:30am  Sun: Closed  Holidays: 6:00am - 8:00am    -[facility]- We use FDA-approved medications, like methadone/suboxone/sublocade, and vivitrol. These  medications are then combined with customized care plans that include individual or group counseling, toxicology, and medical care directed by on-site physicians. Accepts most insurance plans, Medicaid, and private pay.    [address]  [place], [place] [address]  Phone:?[phone]   Monday-Friday 5:00 AM - 10:00 AM  Saturday  6:00 AM - 8:30 AM  Sunday 6:00 AM - 7:00 AM    -[facility]- [facility] is a treatment & recovery focused program. In addition to receiving methadone medication, our clients participate in individual and group counseling as well as random drug testing. If accepted into the [facility] Opioid Program, you will be provided several intake appointments and a physical exam   [address]  [place], [place] [address]  Office: [phone]  Fax: [phone]     -[facility]- We put our community members at the center of everything we do, for remote treatment services as well as in-person, from alcohol withdrawal to opioid use and more.?   [address], [place], [place] [address]  [phone]   Monday-Wednesday:?9:00am - 5:00pm  Thursday:?9:00am - 6:00pm  Friday:?9:00am - 5:00pm  Saturday:?9:00am - 1:00pm  Sunday:?Closed      -[facility] [place]- Our clinic in [place], a medication unit, offers daily dosing of methadone or buprenorphine in addition to our clinic in [place] offering counseling to help people overcome addiction to heroin and other opiates. We also offer psychiatric services including medication management, and an office based suboxone program.   [address], [place], [place] [address]  Phone [phone]    [facility]-We treat Opioid Addiction using medications that are a combination of buprenorphine and naloxone, which are used to treat adults addicted to narcotic painkillers and drugs such as heroin. They reduce the intense cravings and painful symptoms that accompany  withdrawal.   [address], [place], [place]  Phone: [phone]    [facility])  [address] [place], [place] [address]  [phone]   [place]  [phone]  [address] [place], [place] [address]   M-W??? 5:00am-12:00pm  Thu???? 5:00am-10:00am  Fri?????? 5:00am-12:00pm  Sat????? 5:00am-8:00am  Sun???? Closed   $12/daily for Methadone Treatment.

## 2024-03-11 NOTE — Plan of Care (Signed)

## 2024-03-11 NOTE — Hospital Course (Signed)
 Syncope  Presented with prodrome of dizziness and then remembers waking up on the ground. He is unsure if he hit his head upon LOC. His syncopal episode sounds orthostatic in nature, likely secondary to volume depletion in the setting of poor po intake, continued alcohol use, GI losses (diarrhea), and anti-htn medications. He received 2 L IVF in the ED and another 1L once admitted. On exam, he reports mild dizziness and lightheadedness. Telemetry wo acute arrhythmias.  - Follow up orthostatic vitals  - CT head wo contrast  - Encourage PO intake  - PT/OT recs    AKI with baseline Scr 1.01  Hyponatremia  Hyperkalemia - resolved  S/p 3L IVF, AO x 4. Likely due to underlying hypovolemia, decrease PO intake, alcohol abuse and GI losses. CT a/p with bilateral kidney cysts, no nephrolithiasis or hydronephrosis. Normal Urinary bladder. Denies any current nausea/vomiting/diarrhea. Denies any abdominal pain. Denies dysuria or urinary symptoms. - UA negative nitrites and leukocytes - Na 128 (124), improving. K 4.7 resolved  - Scr 1.61 (1.61), peaked  - Follow-up urine lytes, Na, Cr, Cl - Follow up urine osmolality    Alcohol use disorder Drinks very heavily- noting that he has a few 40 ounce beers a day, splits a 15 pack of beer with his friend, and also drinks some liqour. He does have a history of alcohol withdrawal, reports previous hx of seizures. States his last drink was the evening of 5/21, reaching 48 hours.. Required only one dose Ativan  1 mg on 5/22. CIWA scores 0-2 this morning  - CIWA with ativan , monitor for withdrawals - Continue folic acid  1 mg daily, thiamine  100 mg daily, multivitamins. - Alcohol cessation counseling provided   Ulcerative colitis Patient reports diarrhea with bright red blood noting for the past couple of weeks, CT a/p without acute colitis.  On exam today, patient denies any nausea, vomiting, diarrhea. Colonoscopy 7/23 showed nonbleeding nonthrombosed external/internal  hemorrhoids.  Patient does report that prior to his diarrheal events he was constipated and straining. - ESR 30, CRP 0.8 -Continue home medication mesalamine  4.8 g daily with breakfast -Continue home medication rifaximin  550 mg twice daily   Migraines Has hx of migraines and takes Emgality  once a month, in addition to PRN Ubrelvy . He has needed to take Ubrelvy  more regularly now. headache is improved with putting a towel behind his head. - PRN Fioricet  q6h for migraine - Tylenol  PRN for mild pain - Decrease triggers that would make migraine worse    Combined systolic and diastolic CHF Last echo in march with EF of 40-45%, LV global hypokinesis, and grade I diastolic dysfunction. - Hold home entresto  and farxiga  given hypotension  - Resumed home coreg  6.25 mg BID   ============================================  Jacob Adams came to the hospital for passing out, we gave you fluids.  These are the changes to your medications:  For your high blood pressure and heart failure -Please continue taking Coreg  6.25 mg 2 times a day  Please hold to the following medications: - Please do not take Entresto , Farxiga , spironolactone  until you are seen by your primary care doctor.  I advise you to quit drinking alcohol as it is playing a major role in your diet and these episodes of passing out.  I encourage you to eat more food and drink plenty of water.  If you have any of these following symptoms, please call us  or seek care at an emergency department: -Chest Pain -Difficulty Breathing -Worsening abdominal pain -Syncope (passing  out) -Drooping of face -Slurred speech -Sudden weakness in your leg or arm -Fever -Chills -blood in the stool -dark black, sticky stool  If you have any questions or concerns, call our clinic at [phone] or after hours call [phone] and ask for the internal medicine resident on call.  I am glad you are feeling better. It was a pleasure taking care  for you. I wish a good recovery and good health!   Dr. Lanney Pitts   ========================================== Disposition  Syncope: Likely orthostatic due to underlying volume depletion secondary to poor p.o. intake, continued alcohol use and GI losses.  Orthostatic vitals stable.  CT head without contrast without any acute abnormalities. Alcohol use: He was discharged with folic acid , thiamine , multivitamins.  Alcohol cessation counseling provided. Ulcerative colitis: Did not have any more episodes of diarrhea in the hospital, discharged with home medication mesalamine  4.8 g daily with breakfast and rifaximin  550 mg twice daily. Migraines: Discharged to take home medication Ubrelvy .  Combinedt systolic and diastolic congestive heart failure: GDMT were held during the hospitalization because of hypotension, we continue Coreg  6.25 mg twice daily.  Please evaluate the patient's blood pressures prior to restarting GDMT medications.

## 2024-03-11 NOTE — TOC CM/SW Note (Signed)
 Transition of Care [facility]) - Inpatient Brief Assessment   Patient Details  Name: Jacob Adams MRN: [id number] Date of Birth: 25-Feb-1966  [facility]) CM/SW Contact:    Arron Big, LCSWA Phone Number: 03/11/2024, 2:53 PM   Clinical Narrative: CSW completed Sub use consult and patient stated it was okay for CSW to add resources to his AVS.   TOC will continue to follow as needed.    Transition of Care Asessment: Insurance and Status: Insurance coverage has been reviewed Patient has primary care physician: Yes Home environment has been reviewed: home with son and grandchildren Prior level of function:: independent Prior/Current Home Services: No current home services Social Drivers of Health Review: SDOH reviewed interventions complete Readmission risk has been reviewed: Yes Transition of care needs: no transition of care needs at this time

## 2024-03-11 NOTE — Plan of Care (Signed)
  Problem: Education: Goal: Knowledge of General Education information will improve Description: Including pain rating scale, medication(s)/side effects and non-pharmacologic comfort measures Outcome: Progressing   Problem: Clinical Measurements: Goal: Ability to maintain clinical measurements within normal limits will improve Outcome: Progressing   Problem: Clinical Measurements: Goal: Diagnostic test results will improve Outcome: Progressing   Problem: Clinical Measurements: Goal: Respiratory complications will improve Outcome: Progressing   Problem: Activity: Goal: Risk for activity intolerance will decrease Outcome: Progressing

## 2024-03-12 LAB — CBC
HCT: 32 % — ABNORMAL LOW (ref 39.0–52.0)
Hemoglobin: 10.8 g/dL — ABNORMAL LOW (ref 13.0–17.0)
MCH: 31.7 pg (ref 26.0–34.0)
MCHC: 33.8 g/dL (ref 30.0–36.0)
MCV: 93.8 fL (ref 80.0–100.0)
Platelets: 276 10*3/uL (ref 150–400)
RBC: 3.41 MIL/uL — ABNORMAL LOW (ref 4.22–5.81)
RDW: 15.9 % — ABNORMAL HIGH (ref 11.5–15.5)
WBC: 6.2 10*3/uL (ref 4.0–10.5)
nRBC: 0 % (ref 0.0–0.2)

## 2024-03-12 LAB — BASIC METABOLIC PANEL WITH GFR
Anion gap: 8 (ref 5–15)
BUN: 16 mg/dL (ref 6–20)
CO2: 23 mmol/L (ref 22–32)
Calcium: 8.2 mg/dL — ABNORMAL LOW (ref 8.9–10.3)
Chloride: 102 mmol/L (ref 98–111)
Creatinine, Ser: 1.29 mg/dL — ABNORMAL HIGH (ref 0.61–1.24)
GFR, Estimated: >60 mL/min (ref >60)
Glucose, Bld: 124 mg/dL — ABNORMAL HIGH (ref 70–99)
Potassium: 4.6 mmol/L (ref 3.5–5.1)
Sodium: 133 mmol/L — ABNORMAL LOW (ref 135–145)

## 2024-03-12 MED ORDER — SENNA 8.6 MG PO TABS
1.0000 | ORAL_TABLET | Freq: Every day | ORAL | Status: DC
  Administered 2024-03-12: 8.6 mg via ORAL
  Filled 2024-03-12: qty 1

## 2024-03-12 MED ORDER — POLYETHYLENE GLYCOL 3350 17 G PO PACK
17.0000 g | PACK | Freq: Every day | ORAL | Status: DC
  Administered 2024-03-12: 17 g via ORAL
  Filled 2024-03-12: qty 1

## 2024-03-12 MED ORDER — SENNA 8.6 MG PO TABS: 1.0000 | ORAL_TABLET | Freq: Every day | ORAL | Status: DC | PRN

## 2024-03-12 NOTE — Plan of Care (Signed)

## 2024-03-12 NOTE — Progress Notes (Addendum)
 HD#2 SUBJECTIVE:  Patient Summary: Jacob Adams is is a 58 yo male with alcohol use disorder, ulcerative colitis, CHF (EF 40-45%), asthma, hypertension, and migraines who presents with syncope and hyponatremia admitted for syncopal episodes and hypotension.   Overnight Events: No major overnight events.    Interm History:  Patient evaluated at bedside. Denies any acute concerns. Headache has improved. No n/v or vision changes. States ambulated without lightheadedness or dizziness. Voiding without issue but states some constipation. No abdominal pain.   OBJECTIVE:  Vital Signs: Vitals:   03/11/24 1939 03/11/24 2345 03/12/24 0458 03/12/24 0725  BP: 132/86 114/72 103/66   Pulse: 90 93 84 86  Resp: 19 18 19 20   Temp: 98.6 F (37 C) 98.1 F (36.7 C) 98.1 F (36.7 C)   TempSrc: Oral Oral Oral Oral  SpO2: 99% 100% 97% 96%  Weight:   106.9 kg   Height:       Supplemental O2: Room Air SpO2: 96 %  Filed Weights   03/10/24 1815 03/11/24 0423 03/12/24 0458  Weight: 107.4 kg 107.3 kg 106.9 kg     Intake/Output Summary (Last 24 hours) at 03/12/2024 0837 Last data filed at 03/12/2024 0740 Gross per 24 hour  Intake 720 ml  Output 2900 ml  Net -2180 ml   Net IO Since Admission: -885 mL [03/12/24 0837]  Physical Exam: Constitutional: appears well in bed, NAD Cardiovascular: regular rate and rhythm  Pulmonary/Chest: normal work of breathing on room air  Abdominal: soft, non distended, non tender Neurological: alert & oriented x 3 Psych: normal mood and affect   Patient Lines/Drains/Airways Status     Active Line/Drains/Airways     Name Placement date Placement time Site Days   Peripheral IV 03/10/24 20 G Left;Posterior Hand 03/10/24  0337  Hand  1   Peripheral IV 03/10/24 20 G Left Antecubital 03/10/24  0445  Antecubital  1            Pertinent Labs:    Latest Ref Rng & Units 03/12/2024    3:22 AM 03/11/2024    3:20 AM 03/10/2024    6:59 AM  CBC  WBC 4.0 - 10.5  K/uL 6.2  5.2    Hemoglobin 13.0 - 17.0 g/dL 16.1  09.6  04.5   Hematocrit 39.0 - 52.0 % 32.0  32.0  35.0   Platelets 150 - 400 K/uL 276  274         Latest Ref Rng & Units 03/12/2024    3:22 AM 03/11/2024    3:20 AM 03/10/2024    9:23 PM  CMP  Glucose 70 - 99 mg/dL 409  811  914   BUN 6 - 20 mg/dL 16  15  14    Creatinine 0.61 - 1.24 mg/dL 7.82  9.56  2.13   Sodium 135 - 145 mmol/L 133  128  124   Potassium 3.5 - 5.1 mmol/L 4.6  4.7  4.7   Chloride 98 - 111 mmol/L 102  100  95   CO2 22 - 32 mmol/L 23  22  19    Calcium  8.9 - 10.3 mg/dL 8.2  8.3  8.2     Recent Labs    03/10/24 0451  GLUCAP 118*     Pertinent Imaging: CT HEAD WO CONTRAST ( ) Result Date: 03/11/2024 CLINICAL DATA:  Headache, increasing frequency or severity EXAM: CT HEAD WITHOUT CONTRAST TECHNIQUE: Contiguous axial images were obtained from the base of the skull through the vertex without  intravenous contrast. RADIATION DOSE REDUCTION: This exam was performed according to the departmental dose-optimization program which includes automated exposure control, adjustment of the mA and/or kV according to patient size and/or use of iterative reconstruction technique. COMPARISON:  CT head January 15, 2024. FINDINGS: Brain: No evidence of acute infarction, hemorrhage, hydrocephalus, extra-axial collection or mass lesion/mass effect. Vascular: No hyperdense vessel. Skull: No acute fracture. Sinuses/Orbits: Near-total right maxillary sinus opacification. No acute orbital findings. IMPRESSION: 1. No evidence of acute intracranial abnormality. 2. Near-total right maxillary sinus opacification. Electronically Signed   By: Stevenson Elbe M.D.   On: 03/11/2024 21:40    ASSESSMENT/PLAN:  Assessment: Principal Problem:   Syncope Active Problems:   Alcohol use disorder, severe, dependence (HCC)   Hyponatremia   Diarrhea  Patient Summary Jacob Adams is is a 58 yo male with alcohol use disorder, ulcerative colitis, CHF (EF  40-45%), asthma, hypertension, and migraines who presents with syncope and hyponatremia admitted for syncopal episodes and hypotension on hospital day 2.  Plan:  Syncope likely orthostatic  Presented with prodrome of dizziness with positional change. He is unsure if he hit his head upon LOC. CT head negative for bleed. His syncopal episode sounds orthostatic in nature, likely secondary to volume depletion in the setting of poor po intake, continued alcohol use, GI losses (diarrhea), and anti-htn medications. No arrhthymias on telemetry. S/p IVF with improvement of symptoms. Negative orthostatic vitals.  - Continue to monitor, telemetry  - Encourage PO intake    AKI with baseline Scr 1.01, improving Hyponatremia, improving Na improved to 133. Scr improved to 1.29 from 1.6. S/p 3L IVF, AO x 4. Likely due to underlying hypovolemia, decrease PO intake, alcohol abuse and GI losses. CT a/p with bilateral kidney cysts, no nephrolithiasis or hydronephrosis. Normal Urinary bladder. Denies urinary symptoms. UA unremarkable. Urine lytes suggest FeNA <1% to be pre-renal etiology. Improved with IVF.  - Trend BMP for AKI and hyponatremia    Alcohol use disorder Last drink 5/21. CIWA <2. Drinks very heavily- noting that he has a few 40 ounce beers a day, splits a 15 pack of beer with his friend, and also drinks some liqour. He does have a history of alcohol withdrawal, reports previous hx of seizures.  - CIWA with ativan , monitor for withdrawals - Continue folic acid  1 mg daily, thiamine  100 mg daily, multivitamins. - Alcohol cessation counseling provided   Ulcerative colitis Patient reports diarrhea with bright red blood noting for the past couple of weeks, CT a/p without acute colitis. Denies further diarrhea. Colonoscopy 7/23 showed nonbleeding nonthrombosed external/internal hemorrhoids.  Patient does report that prior to his diarrheal events he was constipated and straining. - ESR 30, CRP 0.8 -Continue  home medication mesalamine  4.8 g daily with breakfast -Continue home medication rifaximin  550 mg twice daily   Migraines Has hx of migraines and takes Emgality  once a month, in addition to PRN Ubrelvy . He has needed to take Ubrelvy  more regularly now. Headache has improved, CT head negative.  - PRN Fioricet  q6h for migraine - Tylenol  PRN for mild pain - Decrease triggers that would make migraine worse    Combined systolic and diastolic CHF Last echo in march with EF of 40-45%, LV global hypokinesis, and grade I diastolic dysfunction. - Hold home entresto  and farxiga  given hypotension and AKI, restart when appropriate  - Resumed home coreg  6.25 mg BID   Right maxillary sinus opacification  Incidental finding on CT head. Denies any facial fullness or pain. Headache is  more forehead. No current allergies or nasal congestion.    Best Practice: Diet: Regular diet IVF: Fluids: None,  VTE: rivaroxaban (XARELTO) tablet 10 mg Start: 03/10/24 1700 Code: Full AB: None  Therapy Recs: None  DISPO: Anticipated discharge likely tomorrow pending medical management  Signature: Jearldine Mina, DO Internal Medicine Resident PGY-2  Please contact the on-call pager after 5 pm and on weekends at [phone].

## 2024-03-13 DIAGNOSIS — F1020 Alcohol dependence, uncomplicated: Secondary | ICD-10-CM | POA: Diagnosis not present

## 2024-03-13 DIAGNOSIS — R55 Syncope and collapse: Secondary | ICD-10-CM | POA: Diagnosis not present

## 2024-03-13 DIAGNOSIS — E871 Hypo-osmolality and hyponatremia: Secondary | ICD-10-CM | POA: Diagnosis not present

## 2024-03-13 LAB — CBC
HCT: 32.4 % — ABNORMAL LOW (ref 39.0–52.0)
Hemoglobin: 10.8 g/dL — ABNORMAL LOW (ref 13.0–17.0)
MCH: 32 pg (ref 26.0–34.0)
MCHC: 33.3 g/dL (ref 30.0–36.0)
MCV: 96.1 fL (ref 80.0–100.0)
Platelets: 326 10*3/uL (ref 150–400)
RBC: 3.37 MIL/uL — ABNORMAL LOW (ref 4.22–5.81)
RDW: 16.2 % — ABNORMAL HIGH (ref 11.5–15.5)
WBC: 6.6 10*3/uL (ref 4.0–10.5)
nRBC: 0 % (ref 0.0–0.2)

## 2024-03-13 LAB — BASIC METABOLIC PANEL WITH GFR
Anion gap: 10 (ref 5–15)
BUN: 16 mg/dL (ref 6–20)
CO2: 22 mmol/L (ref 22–32)
Calcium: 8.7 mg/dL — ABNORMAL LOW (ref 8.9–10.3)
Chloride: 104 mmol/L (ref 98–111)
Creatinine, Ser: 1.02 mg/dL (ref 0.61–1.24)
GFR, Estimated: >60 mL/min (ref >60)
Glucose, Bld: 104 mg/dL — ABNORMAL HIGH (ref 70–99)
Potassium: 4.6 mmol/L (ref 3.5–5.1)
Sodium: 136 mmol/L (ref 135–145)

## 2024-03-13 MED ORDER — DULOXETINE HCL 30 MG PO CPEP: ORAL_CAPSULE | ORAL | Status: DC

## 2024-03-13 MED ORDER — BUTALBITAL-APAP-CAFFEINE 50-325-40 MG PO TABS: 1.0000 | ORAL_TABLET | Freq: Four times a day (QID) | ORAL | 0 refills | Status: DC | PRN

## 2024-03-13 NOTE — Plan of Care (Signed)
  Problem: Education: Goal: Knowledge of General Education information will improve Description: Including pain rating scale, medication(s)/side effects and non-pharmacologic comfort measures 03/13/2024 1308 by Arlester Bence, RN Outcome: Adequate for Discharge 03/13/2024 1308 by Arlester Bence, RN Outcome: Adequate for Discharge

## 2024-03-13 NOTE — Discharge Summary (Addendum)
 Name: Jacob Adams MRN: [id number] DOB: [birth year] 58 y.o. PCP: System, Provider Not In  Date of Admission: 03/10/2024  3:29 AM Date of Discharge: 03/13/2024 Attending Physician: Dr. Ancil Balzarine  Discharge Diagnosis: Principal Problem:   Syncope Active Problems:   Alcohol use disorder, severe, dependence (HCC)   Hyponatremia   Diarrhea    Discharge Medications: Allergies as of 03/13/2024       Reactions   Nsaids Swelling, Rash, Other (See Comments)   Stomach pain   Motrin  [ibuprofen ] Nausea Only, Swelling, Other (See Comments)   Stomach pain   Pork (diagnostic) Other (See Comments)   Sunscreens Swelling, Other (See Comments)   Skin peels   Penicillins Itching, Swelling, Rash   [name] Other (See Comments)   Does not eat pork or pork byproducts, personal preference.        Medication List     PAUSE taking these medications    Entresto  49-51 MG Wait to take this until your doctor or other care provider tells you to start again. Generic drug: sacubitril -valsartan  Take 1 tablet by mouth 2 (two) times daily.   Farxiga  10 MG Tabs tablet Wait to take this until your doctor or other care provider tells you to start again. Generic drug: dapagliflozin  propanediol Take 10 mg by mouth in the morning.   oxyCODONE -acetaminophen  5-325 MG tablet Wait to take this until your doctor or other care provider tells you to start again. Commonly known as: Percocet Take 1-2 tablets by mouth every 6 (six) hours as needed. To be taken after surgery   prazosin  5 MG capsule Wait to take this until your doctor or other care provider tells you to start again. Commonly known as: MINIPRESS  Take 5 mg by mouth at bedtime.   spironolactone  25 MG tablet Wait to take this until your doctor or other care provider tells you to start again. Commonly known as: ALDACTONE  Take 1 tablet (25 mg total) by mouth daily.       TAKE these medications    albuterol  108 (90 Base) MCG/ACT  inhaler Commonly known as: VENTOLIN  HFA Inhale 2 puffs into the lungs every 6 (six) hours as needed for wheezing or shortness of breath.   atorvastatin  80 MG tablet Commonly known as: LIPITOR Take 1 tablet (80 mg total) by mouth at bedtime. What changed: when to take this   CALTRATE 600+D3 PO Take 1 tablet by mouth in the morning.   carvedilol  6.25 MG tablet Commonly known as: COREG  Take 1 tablet (6.25 mg total) by mouth 2 (two) times daily.   CertaVite/Antioxidants Tabs Take 1 tablet by mouth daily. What changed: when to take this   docusate sodium  100 MG capsule Commonly known as: Colace Take 1 capsule (100 mg total) by mouth daily as needed.   DULoxetine  30 MG capsule Commonly known as: CYMBALTA  Take 1 capsule (30mg ) by mouth twice daily.   Emgality  120 MG/ML Soaj Generic drug: Galcanezumab -gnlm Inject 120 mg into the skin See admin instructions. Inject 120mg  subcutaneously once a month, between the 1st and 3rd.   famotidine  20 MG tablet Commonly known as: PEPCID  Take 20 mg by mouth 2 (two) times daily.   folic acid  1 MG tablet Commonly known as: FOLVITE  Take 1 tablet (1 mg total) by mouth daily. What changed: when to take this   latanoprost  0.005 % ophthalmic solution Commonly known as: XALATAN  Place 1 drop into both eyes at bedtime.   levothyroxine  88 MCG tablet Commonly known as: SYNTHROID  Take 88  mcg by mouth daily before breakfast.   MAGNESIUM  PO Take 1 capsule by mouth every evening.   mesalamine  1.2 g EC tablet Commonly known as: LIALDA  Take 4.8 g by mouth daily with breakfast.   metFORMIN 500 MG tablet Commonly known as: GLUCOPHAGE Take 500 mg by mouth in the morning.   methocarbamol  750 MG tablet Commonly known as: Robaxin -750 Take 1 tablet (750 mg total) by mouth 3 (three) times daily as needed for muscle spasms.   naltrexone  50 MG tablet Commonly known as: DEPADE Take 50 mg by mouth in the morning.   ondansetron  4 MG tablet Commonly  known as: Zofran  Take 1 tablet (4 mg total) by mouth every 8 (eight) hours as needed for nausea or vomiting.   pantoprazole  40 MG tablet Commonly known as: Protonix  Take 1 tablet (40 mg total) by mouth daily. What changed: when to take this   polyethylene glycol powder 17 GM/SCOOP powder Commonly known as: GLYCOLAX /MIRALAX  Take 17 g by mouth daily as needed for mild constipation.   QUEtiapine  400 MG tablet Commonly known as: SEROQUEL  Take 200 mg by mouth at bedtime.   rifaximin  550 MG Tabs tablet Commonly known as: XIFAXAN  Take 550 mg by mouth 2 (two) times daily.   thiamine  100 MG tablet Commonly known as: VITAMIN B1 Take 1 tablet (100 mg total) by mouth daily. What changed: when to take this   Ubrelvy  50 MG Tabs Generic drug: Ubrogepant  Take 1 tablet by mouth daily as needed.        Disposition and follow-up:   Mr.Rosalio C Vandermeulen was discharged from [facility] in Good condition.  At the hospital follow up visit please address:  1.  Follow-up:  a. Syncope 2/2 orthostatic: ensure no further episodes, reassess if BP allows for Entresto , Farxiga  and Spironolactone      b. Alcohol use: assess taking Naltrexone , continue cessation counseling    c. Migraines: assess symptoms, ensure taking Emgality  and Ubrelvy    d. HFrEF: assess BP and if able to restart Entresto , Farxiga  and spironolactone  (AKI and hypotension resolved)  2.  Labs / imaging needed at time of follow-up: BMP, CBC 3.  Pending labs/ test needing follow-up: none 4.  Medication Changes  -HOLD Farxiga , Entresto , Spironolactone    Follow-up Appointments:  Follow-up Information     [facility]. Schedule an appointment as soon as possible for a visit.   Why: Schedule follow up appt with primary care doctor. Contact information: [address], [place], [place] [address]  [phone]        [facility]. Schedule an appointment as soon as possible for a  visit.   Why: Schedule follow up appt with primary care doctor. Contact information: [address] [place]  [phone]                Hospital Course by problem list: JAYMIAN BOGART is is a 58 yo male with alcohol use disorder, ulcerative colitis, CHF (EF 40-45%), asthma, hypertension, and migraines who presents with syncope and hyponatremia admitted for syncopal episodes and hypotension on hospital day 2.  Syncope 2/2 orthostatic Presented with prodrome of dizziness with postural change and then remembers waking up on the ground. He is unsure if he hit his head upon LOC. CT head negative for acute changes. His syncopal episode sounds orthostatic in nature, likely secondary to volume depletion in the setting of recent poor po intake, continued alcohol use, GI losses (diarrhea), and anti-BP  medications. S/p 3L IVF with negative orthostatics vitals. AKI resolved. Telemetry without acute arrhythmias. Patient denies any further lightheaded or dizziness. Worked well with PT/OT. Holding home Entresto , Farxiga  and spironolactone  at discharge until PCP f/u.    AKI, resolved Hyponatremia, resolved S/p 3L IVF, AO x 4. Likely due to underlying hypovolemia, decrease PO intake, alcohol abuse and GI losses. CT a/p with bilateral kidney cysts, no nephrolithiasis or hydronephrosis. Normal Urinary bladder. Denies any current n/v/d. Denies any abdominal pain. Denies dysuria or urinary symptoms. Urine lytes with FeNA <1% suggestive or pre-renal etiology. UA negative nitrites and leukocytes. Sodium stable at 136 at discharge. AKI resolved with Scr 1.02.   Alcohol use disorder Drinks very heavily- noting that he has a few 40 ounce beers a day, splits a 15 pack of beer with his friend, and also drinks some liqour. He does have a history of alcohol withdrawal, reports previous hx of seizures. States his last drink was the evening of 5/21. CIWA scores been stable and low without frequent  Ativan  doses. Vitals are stable. Patient to resume home naltrexone  and PCP f/u appt for continue alcohol cessation counseling.   Ulcerative colitis Patient reports diarrhea with bright red blood noting for the past couple of weeks, CT a/p without acute colitis.  Denies any nausea, vomiting, diarrhea. Colonoscopy 7/23 showed nonbleeding nonthrombosed external/internal hemorrhoids.  Patient does report that prior to his diarrheal events he was constipated and straining. Normal CRP and decreased ESR from prior. Not suggestive of acute flare. Continue home mesalamine  and rifaximin .   Migraines Has hx of migraines and takes Emgality  once a month, in addition to PRN Ubrelvy . He has needed to take Ubrelvy  more regularly now. CT head without acute findings. Received Fioricet  with improvement of headache. Patient to resume home Emgality  and Ubrelvy  at discharge.    Combined systolic and diastolic CHF Last echo in march with EF of 40-45%, LV global hypokinesis, and grade I diastolic dysfunction. Resumed home Coreg  with stable BP. HOLD home Entresto , Farxiga  and spironolactone     Discharge Subjective: Patient evaluated at bedside. States headache has improved from yesterday. No other focal changes such as vision changes or n/v. Had BM and voiding fine. Ambulated without issue and no lightheaded or dizziness. Discussed discharge and alcohol cessation and close follow up with PCP. Patient verbalizes understanding and agrees with plan.   Discharge Exam:   BP 123/89 (BP Location: Right Arm)   Pulse 70   Temp 98.5 F (36.9 C) (Axillary)   Resp 18   Ht 5\' 10"  (1.778 m)   Wt 106.9 kg   SpO2 100%   BMI 33.81 kg/m  Constitutional: well appearing male laying in bed, in no acute distress Cardiovascular: regular rate and rhythm Pulmonary/Chest: normal work of breathing on room air, lungs clear to auscultation bilaterally Abdominal: soft, non-tender, non-distended MSK: no LE edema Neurological: alert &  oriented x 3 Psych: normal mood and affect   Pertinent Labs, Studies, and Procedures:     Latest Ref Rng & Units 03/13/2024    3:00 AM 03/12/2024    3:22 AM 03/11/2024    3:20 AM  CBC  WBC 4.0 - 10.5 K/uL 6.6  6.2  5.2   Hemoglobin 13.0 - 17.0 g/dL 16.1  09.6  04.5   Hematocrit 39.0 - 52.0 % 32.4  32.0  32.0   Platelets 150 - 400 K/uL 326  276  274        Latest Ref Rng & Units 03/13/2024  3:00 AM 03/12/2024    3:22 AM 03/11/2024    3:20 AM  CMP  Glucose 70 - 99 mg/dL 161  096  045   BUN 6 - 20 mg/dL 16  16  15    Creatinine 0.61 - 1.24 mg/dL 4.09  8.11  9.14   Sodium 135 - 145 mmol/L 136  133  128   Potassium 3.5 - 5.1 mmol/L 4.6  4.6  4.7   Chloride 98 - 111 mmol/L 104  102  100   CO2 22 - 32 mmol/L 22  23  22    Calcium  8.9 - 10.3 mg/dL 8.7  8.2  8.3     CT HEAD WO CONTRAST ( ) Result Date: 03/11/2024 CLINICAL DATA:  Headache, increasing frequency or severity EXAM: CT HEAD WITHOUT CONTRAST TECHNIQUE: Contiguous axial images were obtained from the base of the skull through the vertex without intravenous contrast. RADIATION DOSE REDUCTION: This exam was performed according to the departmental dose-optimization program which includes automated exposure control, adjustment of the mA and/or kV according to patient size and/or use of iterative reconstruction technique. COMPARISON:  CT head January 15, 2024. FINDINGS: Brain: No evidence of acute infarction, hemorrhage, hydrocephalus, extra-axial collection or mass lesion/mass effect. Vascular: No hyperdense vessel. Skull: No acute fracture. Sinuses/Orbits: Near-total right maxillary sinus opacification. No acute orbital findings. IMPRESSION: 1. No evidence of acute intracranial abnormality. 2. Near-total right maxillary sinus opacification. Electronically Signed   By: Stevenson Elbe M.D.   On: 03/11/2024 21:40   CT ABDOMEN PELVIS W CONTRAST Result Date: 03/10/2024 CLINICAL DATA:  Diarrhea.  Hypotension. EXAM: CT ABDOMEN AND PELVIS WITH  CONTRAST TECHNIQUE: Multidetector CT imaging of the abdomen and pelvis was performed using the standard protocol following bolus administration of intravenous contrast. RADIATION DOSE REDUCTION: This exam was performed according to the departmental dose-optimization program which includes automated exposure control, adjustment of the mA and/or kV according to patient size and/or use of iterative reconstruction technique. CONTRAST:  75mL OMNIPAQUE  IOHEXOL  350 MG/ML SOLN COMPARISON:  10/14/2022 FINDINGS: Lower chest: Mild dependent changes along the lung bases. No significant pleural effusion or consolidative change. Hepatobiliary: No focal liver abnormality is seen. No gallstones, gallbladder wall thickening, or biliary dilatation. Pancreas: Unremarkable. No pancreatic ductal dilatation or surrounding inflammatory changes. Spleen: Normal in size without focal abnormality. Adrenals/Urinary Tract: Normal adrenal glands. Bilateral kidney cysts. The largest is off the upper pole of left kidney measuring 2 cm. No follow-up imaging recommended. No nephrolithiasis or hydronephrosis. Urinary bladder appears normal. Stomach/Bowel: Stomach is within normal limits. No signs of acute appendicitis. No pathologic dilatation of the large or small bowel loops. No significant bowel wall thickening or inflammation. Vascular/Lymphatic: No significant vascular findings are present. No enlarged abdominal or pelvic lymph nodes. Reproductive: Prostate is unremarkable. Other: No ascites or focal fluid collections. Fat containing, midline ventral abdominal wall hernia just above the umbilicus measures 3.5 cm. No signs of free air. Musculoskeletal: No acute or significant osseous findings. Lumbar degenerative disc disease. IMPRESSION: 1. No acute findings within the abdomen or pelvis. 2. Fat containing, midline ventral abdominal wall hernia just above the umbilicus measures 3.5 cm. Electronically Signed   By: Kimberley Penman M.D.   On:  03/10/2024 06:03     Discharge Instructions: Discharge Instructions     Call MD for:  difficulty breathing, headache or visual disturbances   Complete by: As directed    Call MD for:  persistant dizziness or light-headedness   Complete by: As directed    Call  MD for:  persistant nausea and vomiting   Complete by: As directed    Diet - low sodium heart healthy   Complete by: As directed    Discharge instructions   Complete by: As directed    You were hospitalized for fainting episode found to have a kidney injury. You improved with IV fluids and your kidney function and blood pressure are back to normal. We did stop your Entresto , Farxiga  and Spironolactone  until you see your primary care doctor. You should resume your home Emgality  and Ubrelvy  for migraine headaches. I sent for a few days of Fioricet  as needed for migraines. Thank you for allowing us  to be part of your care.   Please schedule hospital follow-up appointments with your primary care: [facility] and[facility]  Please note these changes made to your medications:  *Please STOP taking:  -STOP Entresto , STOP Farxiga , STOP spironolactone  (stop until you see your primary care doctor)   Increase activity slowly   Complete by: As directed        Signed: Jearldine Mina, DO 03/13/2024, 12:41 PM

## 2024-03-14 LAB — TYPE AND SCREEN
ABO/RH(D): O POS
Antibody Screen: NEGATIVE
Unit division: 0
Unit division: 0

## 2024-03-14 LAB — BPAM RBC
Blood Product Expiration Date: 202506172359
Blood Product Expiration Date: 202506172359
ISSUE DATE / TIME: 202505220342
ISSUE DATE / TIME: 202505220342
Unit Type and Rh: 5100
Unit Type and Rh: 5100

## 2024-03-15 ENCOUNTER — Encounter (HOSPITAL_COMMUNITY): Payer: Self-pay

## 2024-03-15 ENCOUNTER — Emergency Department (HOSPITAL_COMMUNITY)
Admission: EM | Admit: 2024-03-15 | Discharge: 2024-03-16 | Disposition: A | Discharge status: Home / Self Care | Payer: MEDICAID | Attending: Emergency Medicine | Admitting: Emergency Medicine

## 2024-03-15 ENCOUNTER — Encounter: Admitting: Physician Assistant

## 2024-03-15 ENCOUNTER — Other Ambulatory Visit: Payer: Self-pay

## 2024-03-15 DIAGNOSIS — R109 Unspecified abdominal pain: Secondary | ICD-10-CM | POA: Diagnosis present

## 2024-03-15 DIAGNOSIS — K2970 Gastritis, unspecified, without bleeding: Secondary | ICD-10-CM | POA: Diagnosis not present

## 2024-03-15 DIAGNOSIS — R079 Chest pain, unspecified: Secondary | ICD-10-CM

## 2024-03-15 LAB — CBC WITH DIFFERENTIAL/PLATELET
Abs Immature Granulocytes: 0.03 10*3/uL (ref 0.00–0.07)
Basophils Absolute: 0.1 10*3/uL (ref 0.0–0.1)
Basophils Relative: 1 %
Eosinophils Absolute: 0 10*3/uL (ref 0.0–0.5)
Eosinophils Relative: 0 %
HCT: 37.9 % — ABNORMAL LOW (ref 39.0–52.0)
Hemoglobin: 13 g/dL (ref 13.0–17.0)
Immature Granulocytes: 0 %
Lymphocytes Relative: 24 %
Lymphs Abs: 1.7 10*3/uL (ref 0.7–4.0)
MCH: 31.6 pg (ref 26.0–34.0)
MCHC: 34.3 g/dL (ref 30.0–36.0)
MCV: 92.2 fL (ref 80.0–100.0)
Monocytes Absolute: 1 10*3/uL (ref 0.1–1.0)
Monocytes Relative: 14 %
Neutro Abs: 4.2 10*3/uL (ref 1.7–7.7)
Neutrophils Relative %: 61 %
Platelets: 339 10*3/uL (ref 150–400)
RBC: 4.11 MIL/uL — ABNORMAL LOW (ref 4.22–5.81)
RDW: 15.8 % — ABNORMAL HIGH (ref 11.5–15.5)
WBC: 7 10*3/uL (ref 4.0–10.5)
nRBC: 0 % (ref 0.0–0.2)

## 2024-03-15 LAB — COMPREHENSIVE METABOLIC PANEL WITH GFR
ALT: 20 U/L (ref 0–44)
AST: 32 U/L (ref 15–41)
Albumin: 4 g/dL (ref 3.5–5.0)
Alkaline Phosphatase: 72 U/L (ref 38–126)
Anion gap: 15 (ref 5–15)
BUN: 10 mg/dL (ref 6–20)
CO2: 20 mmol/L — ABNORMAL LOW (ref 22–32)
Calcium: 9.7 mg/dL (ref 8.9–10.3)
Chloride: 99 mmol/L (ref 98–111)
Creatinine, Ser: 1.03 mg/dL (ref 0.61–1.24)
GFR, Estimated: >60 mL/min (ref >60)
Glucose, Bld: 83 mg/dL (ref 70–99)
Potassium: 4.2 mmol/L (ref 3.5–5.1)
Sodium: 134 mmol/L — ABNORMAL LOW (ref 135–145)
Total Bilirubin: 0.5 mg/dL (ref 0.0–1.2)
Total Protein: 7.9 g/dL (ref 6.5–8.1)

## 2024-03-15 LAB — LIPASE, BLOOD: Lipase: 56 U/L — ABNORMAL HIGH (ref 11–51)

## 2024-03-15 LAB — TROPONIN I (HIGH SENSITIVITY): Troponin I (High Sensitivity): 11 ng/L (ref <18)

## 2024-03-15 NOTE — ED Notes (Signed)
IV attempt unsuccessful x 2

## 2024-03-15 NOTE — ED Triage Notes (Signed)
 PER EMS: pt is from home with c/o generalized abd pain onset today around 1500 associated for dizziness.  BP- 158/80, HR-80, 100% RA, CBG-90

## 2024-03-15 NOTE — ED Provider Triage Note (Signed)
 Emergency Medicine Provider Triage Evaluation Note  Jacob Adams , a 58 y.o. male  was evaluated in triage.  Pt complains of "feels weird".  He is having generalized abdominal pain, nausea and dizziness since around 3 PM today.  It is worse when he stands up, history of alcohol dependence, heart failure, hypertension, CHF  Review of Systems  Positive: Dizziness Negative: Shortness of breath  Physical Exam  BP (!) 150/99 (BP Location: Right Arm)   Pulse 89   Temp 99.4 F (37.4 C) (Oral)   Resp 17   Ht 5\' 10"  (1.778 m)   Wt 106.6 kg   SpO2 100%   BMI 33.72 kg/m  Gen:   Awake, no distress   Resp:  Normal effort  MSK:   Moves extremities without difficulty  Other:    Medical Decision Making  Medically screening exam initiated at 7:02 PM.  Appropriate orders placed.  Arnett Bi was informed that the remainder of the evaluation will be completed by another provider, this initial triage assessment does not replace that evaluation, and the importance of remaining in the ED until their evaluation is complete.     Aimee Houseman, [place] 03/15/24 [phone]

## 2024-03-16 LAB — URINALYSIS, ROUTINE W REFLEX MICROSCOPIC
Bacteria, UA: NONE SEEN
Bilirubin Urine: NEGATIVE
Glucose, UA: NEGATIVE mg/dL
Hgb urine dipstick: NEGATIVE
Ketones, ur: 5 mg/dL — AB
Nitrite: NEGATIVE
Protein, ur: 100 mg/dL — AB
Specific Gravity, Urine: 1.017 (ref 1.005–1.030)
pH: 5 (ref 5.0–8.0)

## 2024-03-16 LAB — TROPONIN I (HIGH SENSITIVITY): Troponin I (High Sensitivity): 11 ng/L (ref <18)

## 2024-03-16 MED ORDER — HYOSCYAMINE SULFATE 0.125 MG SL SUBL: 0.1250 mg | SUBLINGUAL_TABLET | SUBLINGUAL | 0 refills | Status: DC | PRN

## 2024-03-16 MED ORDER — SUCRALFATE 1 G PO TABS: 1.0000 g | ORAL_TABLET | Freq: Three times a day (TID) | ORAL | 0 refills | Status: DC

## 2024-03-16 NOTE — ED Notes (Signed)
 Patient discharged in stable condition, education materials explained including, follow up, any prescriptions and reasons to return. Patient voiced agreement to education and discharge material.

## 2024-03-16 NOTE — ED Provider Notes (Signed)
 [facility] AT [facility] Provider Note   CSN: [id number] Arrival date & time: 03/15/24  1844     History  Chief Complaint  Patient presents with   Abdominal Pain    Jacob Adams is a 58 y.o. male.  Patient returns to the emergency department with continued symptoms.  He reports that he was seen a week ago after he passed out, was admitted to the hospital.  Patient reports that through the course of today he has been having intermittent "stomach cramps" with dizziness, especially when he stands up.  He has not passed out today.  He reports of burning sensation throughout the chest as well.       Home Medications Prior to Admission medications   Medication Sig Start Date End Date Taking? Authorizing Provider  hyoscyamine  (LEVSIN/SL) 0.125 MG SL tablet Place 1 tablet (0.125 mg total) under the tongue every 4 (four) hours as needed. 03/16/24  Yes Edman Lipsey, Marine Sia, MD  sucralfate  (CARAFATE ) 1 g tablet Take 1 tablet (1 g total) by mouth 4 (four) times daily -  with meals and at bedtime. 03/16/24  Yes Sonny Poth, Marine Sia, MD  albuterol  (VENTOLIN  HFA) 108 (90 Base) MCG/ACT inhaler Inhale 2 puffs into the lungs every 6 (six) hours as needed for wheezing or shortness of breath.    [provider]  atorvastatin  (LIPITOR) 80 MG tablet Take 1 tablet (80 mg total) by mouth at bedtime. Patient taking differently: Take 80 mg by mouth in the morning. 01/06/24   Silas Drivers, MD  Calcium  Carb-Cholecalciferol  (CALTRATE 600+D3 PO) Take 1 tablet by mouth in the morning.    [provider]  carvedilol  (COREG ) 6.25 MG tablet Take 1 tablet (6.25 mg total) by mouth 2 (two) times daily. 01/06/24   Silas Drivers, MD  docusate sodium  (COLACE) 100 MG capsule Take 1 capsule (100 mg total) by mouth daily as needed. 02/18/24 02/17/25  Sandie Cross, PA-C  DULoxetine  (CYMBALTA ) 30 MG capsule Take 1 capsule (30mg ) by mouth twice daily. 03/13/24   Zheng,  Michael, DO  famotidine  (PEPCID ) 20 MG tablet Take 20 mg by mouth 2 (two) times daily.    [provider]  FARXIGA  10 MG TABS tablet Take 10 mg by mouth in the morning. 11/24/23   [provider]  folic acid  (FOLVITE ) 1 MG tablet Take 1 tablet (1 mg total) by mouth daily. Patient taking differently: Take 1 mg by mouth in the morning. 01/18/24   Lavada Porteous, DO  Galcanezumab -gnlm (EMGALITY ) 120 MG/ML SOAJ Inject 120 mg into the skin See admin instructions. Inject 120mg  subcutaneously once a month, between the 1st and 3rd.    [provider]  latanoprost  (XALATAN ) 0.005 % ophthalmic solution Place 1 drop into both eyes at bedtime.    [provider]  levothyroxine  (SYNTHROID ) 88 MCG tablet Take 88 mcg by mouth daily before breakfast.    [provider]  MAGNESIUM  PO Take 1 capsule by mouth every evening.    [provider]  mesalamine  (LIALDA ) 1.2 g EC tablet Take 4.8 g by mouth daily with breakfast.    [provider]  metFORMIN (GLUCOPHAGE) 500 MG tablet Take 500 mg by mouth in the morning.    [provider]  methocarbamol  (ROBAXIN -750) 750 MG tablet Take 1 tablet (750 mg total) by mouth 3 (three) times daily as needed for muscle spasms. 02/18/24   Sandie Cross, PA-C  Multiple Vitamin (MULTIVITAMIN WITH MINERALS) TABS tablet  Take 1 tablet by mouth daily. Patient taking differently: Take 1 tablet by mouth in the morning. 01/18/24   Lavada Porteous, DO  naltrexone  (DEPADE) 50 MG tablet Take 50 mg by mouth in the morning.    [provider]  ondansetron  (ZOFRAN ) 4 MG tablet Take 1 tablet (4 mg total) by mouth every 8 (eight) hours as needed for nausea or vomiting. 02/18/24   Sandie Cross, PA-C  oxyCODONE -acetaminophen  (PERCOCET) 5-325 MG tablet Take 1-2 tablets by mouth every 6 (six) hours as needed. To be taken after surgery 02/18/24   Sandie Cross, PA-C  pantoprazole  (PROTONIX ) 40 MG tablet Take 1 tablet (40 mg  total) by mouth daily. Patient taking differently: Take 40 mg by mouth in the morning. 01/02/24 03/10/24  Ozell Blunt, MD  polyethylene glycol powder (GLYCOLAX /MIRALAX ) 17 GM/SCOOP powder Take 17 g by mouth daily as needed for mild constipation. 01/18/24   Lavada Porteous, DO  prazosin  (MINIPRESS ) 5 MG capsule Take 5 mg by mouth at bedtime.    [provider]  QUEtiapine  (SEROQUEL ) 400 MG tablet Take 200 mg by mouth at bedtime.    [provider]  rifaximin  (XIFAXAN ) 550 MG TABS tablet Take 550 mg by mouth 2 (two) times daily.    [provider]  sacubitril -valsartan  (ENTRESTO ) 49-51 MG Take 1 tablet by mouth 2 (two) times daily. 01/07/24   Silas Drivers, MD  spironolactone  (ALDACTONE ) 25 MG tablet Take 1 tablet (25 mg total) by mouth daily. Patient taking differently: Take 25 mg by mouth in the morning. 01/07/24   Silas Drivers, MD  thiamine  (VITAMIN B1) 100 MG tablet Take 1 tablet (100 mg total) by mouth daily. Patient taking differently: Take 100 mg by mouth in the morning. 01/18/24   Lavada Porteous, DO  Ubrogepant  (UBRELVY ) 50 MG TABS Take 1 tablet by mouth daily as needed.    [provider]      Allergies    Nsaids, Motrin  [ibuprofen ], Pork (diagnostic), Sunscreens, Penicillins, and [name]    Review of Systems   Review of Systems  Physical Exam Updated Vital Signs BP (!) 147/77   Pulse 68   Temp 99.2 F (37.3 C) (Oral)   Resp 17   Ht 5\' 10"  (1.778 m)   Wt 106.6 kg   SpO2 100%   BMI 33.72 kg/m  Physical Exam Vitals and nursing note reviewed.  Constitutional:      General: He is not in acute distress.    Appearance: He is well-developed.  HENT:     Head: Normocephalic and atraumatic.     Mouth/Throat:     Mouth: Mucous membranes are moist.  Eyes:     General: Vision grossly intact. Gaze aligned appropriately.     Extraocular Movements: Extraocular movements intact.     Conjunctiva/sclera: Conjunctivae normal.   Cardiovascular:     Rate and Rhythm: Normal rate and regular rhythm.     Pulses: Normal pulses.     Heart sounds: Normal heart sounds, S1 normal and S2 normal. No murmur heard.    No friction rub. No gallop.  Pulmonary:     Effort: Pulmonary effort is normal. No respiratory distress.     Breath sounds: Normal breath sounds.  Abdominal:     Palpations: Abdomen is soft.     Tenderness: There is no abdominal tenderness. There is no guarding or rebound.     Hernia: No hernia is present.  Musculoskeletal:        General:  No swelling.     Cervical back: Full passive range of motion without pain, normal range of motion and neck supple. No pain with movement, spinous process tenderness or muscular tenderness. Normal range of motion.     Right lower leg: No edema.     Left lower leg: No edema.  Skin:    General: Skin is warm and dry.     Capillary Refill: Capillary refill takes less than 2 seconds.     Findings: No ecchymosis, erythema, lesion or wound.  Neurological:     Mental Status: He is alert and oriented to person, place, and time.     GCS: GCS eye subscore is 4. GCS verbal subscore is 5. GCS motor subscore is 6.     Cranial Nerves: Cranial nerves 2-12 are intact.     Sensory: Sensation is intact.     Motor: Motor function is intact. No weakness or abnormal muscle tone.     Coordination: Coordination is intact.  Psychiatric:        Mood and Affect: Mood normal.        Speech: Speech normal.        Behavior: Behavior normal.     ED Results / Procedures / Treatments   Labs (all labs ordered are listed, but only abnormal results are displayed) Labs Reviewed  CBC WITH DIFFERENTIAL/PLATELET - Abnormal; Notable for the following components:      Result Value   RBC 4.11 (*)    HCT 37.9 (*)    RDW 15.8 (*)    All other components within normal limits  COMPREHENSIVE METABOLIC PANEL WITH GFR - Abnormal; Notable for the following components:   Sodium 134 (*)    CO2 20 (*)    All  other components within normal limits  LIPASE, BLOOD - Abnormal; Notable for the following components:   Lipase 56 (*)    All other components within normal limits  URINALYSIS, ROUTINE W REFLEX MICROSCOPIC - Abnormal; Notable for the following components:   APPearance HAZY (*)    Ketones, ur 5 (*)    Protein, ur 100 (*)    Leukocytes,Ua TRACE (*)    All other components within normal limits  TROPONIN I (HIGH SENSITIVITY)  TROPONIN I (HIGH SENSITIVITY)    EKG EKG Interpretation Date/Time:  Tuesday Mar 15 2024 19:03:07 EDT Ventricular Rate:  92 PR Interval:  188 QRS Duration:  88 QT Interval:  336 QTC Calculation: 415 R Axis:   -43  Text Interpretation: Normal sinus rhythm Left axis deviation Septal infarct , age undetermined Abnormal ECG When compared with ECG of 10-Mar-2024 03:53, No acute changes Confirmed by Ballard Bongo [phone]) on 03/16/2024 1:17:56 AM  Radiology No results found.  Procedures Procedures    Medications Ordered in ED Medications - No data to display  ED Course/ Medical Decision Making/ A&P                                 Medical Decision Making Risk Prescription drug management.   Differential Diagnosis considered includes, but not limited to: Cholelithiasis; cholecystitis; cholangitis; bowel obstruction; esophagitis; gastritis; peptic ulcer disease; pancreatitis; cardiac.  Presents to the emergency department for evaluation of abdominal and chest discomfort.  Patient reports upper abdominal discomfort with a burning sensation up into the chest.  He does have a history of alcohol abuse.  Patient's evaluation here in the ED is unremarkable.  Hemoglobin is normal,  no signs of ongoing blood loss or bleeding ulcers.  Cardiac evaluation negative.  Patient was admitted to the hospital for syncope last week and had a CT abdomen and pelvis that was unremarkable, does not require repeat imaging.  Reviewing records from his hospitalization last  week, he was orthostatic during that stay.  No orthostasis here.  Treat for presumed gastritis, GI referral.  Follow-up with primary care.        Final Clinical Impression(s) / ED Diagnoses Final diagnoses:  Gastritis without bleeding, unspecified chronicity, unspecified gastritis type  Chest pain, unspecified type    Rx / DC Orders ED Discharge Orders          Ordered    sucralfate  (CARAFATE ) 1 g tablet  3 times daily with meals & bedtime        03/16/24 0335    hyoscyamine  (LEVSIN/SL) 0.125 MG SL tablet  Every 4 hours PRN        03/16/24 0335    Ambulatory referral to Gastroenterology        03/16/24 0335              Ballard Bongo, MD 03/16/24 [phone]

## 2024-03-16 NOTE — Discharge Instructions (Addendum)
 Make sure that you are taking the Protonix  (pantoprazole ) that was previously prescribed.

## 2024-03-25 ENCOUNTER — Ambulatory Visit (INDEPENDENT_AMBULATORY_CARE_PROVIDER_SITE_OTHER): Admitting: Orthopaedic Surgery

## 2024-03-25 ENCOUNTER — Other Ambulatory Visit (INDEPENDENT_AMBULATORY_CARE_PROVIDER_SITE_OTHER): Payer: Self-pay

## 2024-03-25 ENCOUNTER — Encounter: Payer: Self-pay | Admitting: Orthopaedic Surgery

## 2024-03-25 DIAGNOSIS — M1712 Unilateral primary osteoarthritis, left knee: Secondary | ICD-10-CM
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
## 2024-04-08 ENCOUNTER — Ambulatory Visit: Payer: MEDICAID | Admitting: Podiatry

## 2024-04-14 ENCOUNTER — Encounter: Payer: Self-pay | Admitting: Podiatry

## 2024-04-14 ENCOUNTER — Ambulatory Visit: Payer: MEDICAID | Admitting: Podiatry

## 2024-04-14 DIAGNOSIS — M79674 Pain in right toe(s): Secondary | ICD-10-CM

## 2024-04-14 DIAGNOSIS — B351 Tinea unguium: Secondary | ICD-10-CM

## 2024-04-14 DIAGNOSIS — G622 Polyneuropathy due to other toxic agents: Secondary | ICD-10-CM | POA: Diagnosis not present

## 2024-04-14 DIAGNOSIS — M79675 Pain in left toe(s): Secondary | ICD-10-CM

## 2024-04-15 NOTE — Progress Notes (Signed)
 Subjective:   Patient ID: Jacob Adams, male   DOB: 58 y.o.   MRN: [id number]   HPI Patient presents stating that he has had a lot of problems with his toenails and he feels like tingling burning of his feet and states that he cannot take care of them and he cannot do well with it with patient having history of alcoholism.  Patient does smoke a pack a day and is not active   Review of Systems  All other systems reviewed and are negative.       Objective:  Physical Exam Vitals and nursing note reviewed.  Constitutional:      Appearance: He is well-developed.  Pulmonary:     Effort: Pulmonary effort is normal.   Musculoskeletal:        General: Normal range of motion.   Skin:    General: Skin is warm.   Neurological:     Mental Status: He is alert.     Neurovascular status was found to be moderately diminished both sharp dull vibratory and pulses PT DP.  Patient is noted to have severely thickened nailbeds 1-5 both feet dystrophic and painful and he does get a lot of tingling and burning in his feet and is nondescript at not from a specific spot and does have good digital perfusion well-oriented     Assessment:  Chronic mycotic nail infection with pain 1-5 both feet poor health with neuropathic changes     Plan:  H&P reviewed both conditions discussed Daily inspections of his feet reducing alcohol continuing basis and cigarettes if possible.  Debrided nailbeds 1-5 both feet neurogenic bleeding reappoint routine care

## 2024-04-25 NOTE — Pre-Procedure Instructions (Signed)
 Surgical Instructions   Your procedure is scheduled on May 05, 2024. Report to [facility] Main Entrance A at 5:30 A.M., then check in with the Admitting office. Any questions or running late day of surgery: call [phone]  Questions prior to your surgery date: call [phone], Monday-Friday, 8am-4pm. If you experience any cold or flu symptoms such as cough, fever, chills, shortness of breath, etc. between now and your scheduled surgery, please notify us  at the above number.     Remember:  Do not eat after midnight the night before your surgery  You may drink clear liquids until 4:30 AM the morning of your surgery.   Clear liquids allowed are: Water, Non-Citrus Juices (without pulp), Carbonated Beverages, Clear Tea (no milk, honey, etc.), Black Coffee Only (NO MILK, CREAM OR POWDERED CREAMER of any kind), and Gatorade.  Patient Instructions  The night before surgery:  No food after midnight. ONLY clear liquids after midnight  The day of surgery (if you have diabetes): Drink ONE (1) 12 oz G2 given to you in your pre admission testing appointment by 4:30 AM the morning of surgery. Drink in one sitting. Do not sip.  This drink was given to you during your hospital  pre-op appointment visit.  Nothing else to drink after completing the  12 oz bottle of G2.         If you have questions, please contact your surgeon's office.    Take these medicines the morning of surgery with A SIP OF WATER: atorvastatin  (LIPITOR)  carvedilol  (COREG )  DULoxetine  (CYMBALTA )  famotidine  (PEPCID )  levothyroxine  (SYNTHROID )  mesalamine  (LIALDA )  pantoprazole  (PROTONIX )  rifaximin  (XIFAXAN )    May take these medicines IF NEEDED: albuterol  (VENTOLIN  HFA) inhaler - please bring inhaler with you morning of surgery hyoscyamine  (LEVSIN /SL)  ondansetron  (ZOFRAN )  Ubrogepant  (UBRELVY )    Please follow your prescribing physician's instructions on if/when to stop taking naltrexone   (DEPADE).   One week prior to surgery, STOP taking any Aspirin  (unless otherwise instructed by your surgeon) Aleve , Naproxen , Ibuprofen , Motrin , Advil , Goody's, BC's, all herbal medications, fish oil, and non-prescription vitamins.   WHAT DO I DO ABOUT MY DIABETES MEDICATION?   Do not take metFORMIN (GLUCOPHAGE) the morning of surgery.  STOP taking your FARXIGA  three days prior to surgery. Your last dose will be July 13th.       HOW TO MANAGE YOUR DIABETES BEFORE AND AFTER SURGERY  Why is it important to control my blood sugar before and after surgery? Improving blood sugar levels before and after surgery helps healing and can limit problems. A way of improving blood sugar control is eating a healthy diet by:  Eating less sugar and carbohydrates  Increasing activity/exercise  Talking with your doctor about reaching your blood sugar goals High blood sugars (greater than 180 mg/dL) can raise your risk of infections and slow your recovery, so you will need to focus on controlling your diabetes during the weeks before surgery. Make sure that the doctor who takes care of your diabetes knows about your planned surgery including the date and location.  How do I manage my blood sugar before surgery? Check your blood sugar at least 4 times a day, starting 2 days before surgery, to make sure that the level is not too high or low.  Check your blood sugar the morning of your surgery when you wake up and every 2 hours until you get to the Short Stay unit.  If your blood sugar is less than 70  mg/dL, you will need to treat for low blood sugar: Do not take insulin. Treat a low blood sugar (less than 70 mg/dL) with  cup of clear juice (cranberry or apple), 4 glucose tablets, OR glucose gel. Recheck blood sugar in 15 minutes after treatment (to make sure it is greater than 70 mg/dL). If your blood sugar is not greater than 70 mg/dL on recheck, call [phone] for further instructions. Report your  blood sugar to the short stay nurse when you get to Short Stay.  If you are admitted to the hospital after surgery: Your blood sugar will be checked by the staff and you will probably be given insulin after surgery (instead of oral diabetes medicines) to make sure you have good blood sugar levels. The goal for blood sugar control after surgery is 80-180 mg/dL.                      Do NOT Smoke (Tobacco/Vaping) for 24 hours prior to your procedure.  If you use a CPAP at night, you may bring your mask/headgear for your overnight stay.   You will be asked to remove any contacts, glasses, piercing's, hearing aid's, dentures/partials prior to surgery. Please bring cases for these items if needed.    Patients discharged the day of surgery will not be allowed to drive home, and someone needs to stay with them for 24 hours.  SURGICAL WAITING ROOM VISITATION Patients may have no more than 2 support people in the waiting area - these visitors may rotate.   Pre-op nurse will coordinate an appropriate time for 1 ADULT support person, who may not rotate, to accompany patient in pre-op.  Children under the age of 70 must have an adult with them who is not the patient and must remain in the main waiting area with an adult.  If the patient needs to stay at the hospital during part of their recovery, the visitor guidelines for inpatient rooms apply.  Please refer to the [facility] website for the visitor guidelines for any additional information.   If you received a COVID test during your pre-op visit  it is requested that you wear a mask when out in public, stay away from anyone that may not be feeling well and notify your surgeon if you develop symptoms. If you have been in contact with anyone that has tested positive in the last 10 days please notify you surgeon.      Pre-operative 5 CHG Bathing Instructions   You can play a key role in reducing the risk of infection after surgery. Your skin  needs to be as free of germs as possible. You can reduce the number of germs on your skin by washing with CHG (chlorhexidine  gluconate) soap before surgery. CHG is an antiseptic soap that kills germs and continues to kill germs even after washing.   DO NOT use if you have an allergy to chlorhexidine /CHG or antibacterial soaps. If your skin becomes reddened or irritated, stop using the CHG and notify one of our RNs at [phone].   Please shower with the CHG soap starting 4 days before surgery using the following schedule:     Please keep in mind the following:  DO NOT shave, including legs and underarms, starting the day of your first shower.   You may shave your face at any point before/day of surgery.  Place clean sheets on your bed the day you start using CHG soap. Use a clean washcloth (not  used since being washed) for each shower. DO NOT sleep with pets once you start using the CHG.   CHG Shower Instructions:  Wash your face and private area with normal soap. If you choose to wash your hair, wash first with your normal shampoo.  After you use shampoo/soap, rinse your hair and body thoroughly to remove shampoo/soap residue.  Turn the water OFF and apply about 3 tablespoons (45 ml) of CHG soap to a CLEAN washcloth.  Apply CHG soap ONLY FROM YOUR NECK DOWN TO YOUR TOES (washing for 3-5 minutes)  DO NOT use CHG soap on face, private areas, open wounds, or sores.  Pay special attention to the area where your surgery is being performed.  If you are having back surgery, having someone wash your back for you may be helpful. Wait 2 minutes after CHG soap is applied, then you may rinse off the CHG soap.  Pat dry with a clean towel  Put on clean clothes/pajamas   If you choose to wear lotion, please use ONLY the CHG-compatible lotions that are listed below.  Additional instructions for the day of surgery: DO NOT APPLY any lotions, deodorants, cologne, or perfumes.   Do not bring valuables  to the hospital. [facility] is not responsible for any belongings/valuables. Do not wear nail polish, gel polish, artificial nails, or any other type of covering on natural nails (fingers and toes) Do not wear jewelry or makeup Put on clean/comfortable clothes.  Please brush your teeth.  Ask your nurse before applying any prescription medications to the skin.     CHG Compatible Lotions   Aveeno Moisturizing lotion  Cetaphil Moisturizing Cream  Cetaphil Moisturizing Lotion  Clairol Herbal Essence Moisturizing Lotion, Dry Skin  Clairol Herbal Essence Moisturizing Lotion, Extra Dry Skin  Clairol Herbal Essence Moisturizing Lotion, Normal Skin  Curel Age Defying Therapeutic Moisturizing Lotion with Alpha Hydroxy  Curel Extreme Care Body Lotion  Curel Soothing Hands Moisturizing Hand Lotion  Curel Therapeutic Moisturizing Cream, Fragrance-Free  Curel Therapeutic Moisturizing Lotion, Fragrance-Free  Curel Therapeutic Moisturizing Lotion, Original Formula  Eucerin Daily Replenishing Lotion  Eucerin Dry Skin Therapy Plus Alpha Hydroxy Crme  Eucerin Dry Skin Therapy Plus Alpha Hydroxy Lotion  Eucerin Original Crme  Eucerin Original Lotion  Eucerin Plus Crme Eucerin Plus Lotion  Eucerin TriLipid Replenishing Lotion  Keri Anti-Bacterial Hand Lotion  Keri Deep Conditioning Original Lotion Dry Skin Formula Softly Scented  Keri Deep Conditioning Original Lotion, Fragrance Free Sensitive Skin Formula  Keri Lotion Fast Absorbing Fragrance Free Sensitive Skin Formula  Keri Lotion Fast Absorbing Softly Scented Dry Skin Formula  Keri Original Lotion  Keri Skin Renewal Lotion Keri Silky Smooth Lotion  Keri Silky Smooth Sensitive Skin Lotion  Nivea Body Creamy Conditioning Oil  Nivea Body Extra Enriched Lotion  Nivea Body Original Lotion  Nivea Body Sheer Moisturizing Lotion Nivea Crme  Nivea Skin Firming Lotion  NutraDerm 30 Skin Lotion  NutraDerm Skin Lotion  NutraDerm Therapeutic  Skin Cream  NutraDerm Therapeutic Skin Lotion  ProShield Protective Hand Cream  Provon moisturizing lotion  Please read over the following fact sheets that you were given.

## 2024-04-26 ENCOUNTER — Other Ambulatory Visit: Payer: Self-pay | Admitting: Physician Assistant

## 2024-04-26 ENCOUNTER — Other Ambulatory Visit: Payer: Self-pay

## 2024-04-26 ENCOUNTER — Telehealth: Payer: Self-pay | Admitting: Orthopaedic Surgery

## 2024-04-26 ENCOUNTER — Encounter (HOSPITAL_COMMUNITY)
Admission: RE | Admit: 2024-04-26 | Discharge: 2024-04-26 | Disposition: A | Discharge status: Home / Self Care | Payer: MEDICAID | Source: Ambulatory Visit | Attending: Orthopaedic Surgery | Admitting: Orthopaedic Surgery

## 2024-04-26 ENCOUNTER — Encounter (HOSPITAL_COMMUNITY): Payer: Self-pay

## 2024-04-26 VITALS — BP 114/81 | HR 71 | Temp 98.0°F | Resp 18 | Ht 70.0 in | Wt 225.6 lb

## 2024-04-26 DIAGNOSIS — F17210 Nicotine dependence, cigarettes, uncomplicated: Secondary | ICD-10-CM | POA: Insufficient documentation

## 2024-04-26 DIAGNOSIS — Z01818 Encounter for other preprocedural examination: Secondary | ICD-10-CM

## 2024-04-26 DIAGNOSIS — E119 Type 2 diabetes mellitus without complications: Secondary | ICD-10-CM | POA: Insufficient documentation

## 2024-04-26 DIAGNOSIS — G4733 Obstructive sleep apnea (adult) (pediatric): Secondary | ICD-10-CM | POA: Insufficient documentation

## 2024-04-26 DIAGNOSIS — I5022 Chronic systolic (congestive) heart failure: Secondary | ICD-10-CM | POA: Diagnosis not present

## 2024-04-26 DIAGNOSIS — Z79899 Other long term (current) drug therapy: Secondary | ICD-10-CM | POA: Diagnosis not present

## 2024-04-26 DIAGNOSIS — Z01812 Encounter for preprocedural laboratory examination: Secondary | ICD-10-CM | POA: Diagnosis present

## 2024-04-26 DIAGNOSIS — K648 Other hemorrhoids: Secondary | ICD-10-CM | POA: Diagnosis not present

## 2024-04-26 DIAGNOSIS — I110 Hypertensive heart disease with heart failure: Secondary | ICD-10-CM | POA: Insufficient documentation

## 2024-04-26 DIAGNOSIS — K7030 Alcoholic cirrhosis of liver without ascites: Secondary | ICD-10-CM | POA: Diagnosis not present

## 2024-04-26 DIAGNOSIS — Z7984 Long term (current) use of oral hypoglycemic drugs: Secondary | ICD-10-CM | POA: Diagnosis not present

## 2024-04-26 DIAGNOSIS — M1712 Unilateral primary osteoarthritis, left knee: Secondary | ICD-10-CM | POA: Diagnosis not present

## 2024-04-26 LAB — COMPREHENSIVE METABOLIC PANEL WITH GFR
ALT: 17 U/L (ref 0–44)
AST: 29 U/L (ref 15–41)
Albumin: 3.4 g/dL — ABNORMAL LOW (ref 3.5–5.0)
Alkaline Phosphatase: 67 U/L (ref 38–126)
Anion gap: 14 (ref 5–15)
BUN: 15 mg/dL (ref 6–20)
CO2: 20 mmol/L — ABNORMAL LOW (ref 22–32)
Calcium: 9.6 mg/dL (ref 8.9–10.3)
Chloride: 101 mmol/L (ref 98–111)
Creatinine, Ser: 1.38 mg/dL — ABNORMAL HIGH (ref 0.61–1.24)
GFR, Estimated: 59 mL/min — ABNORMAL LOW (ref >60)
Glucose, Bld: 98 mg/dL (ref 70–99)
Potassium: 4.9 mmol/L (ref 3.5–5.1)
Sodium: 135 mmol/L (ref 135–145)
Total Bilirubin: 0.4 mg/dL (ref 0.0–1.2)
Total Protein: 7.5 g/dL (ref 6.5–8.1)

## 2024-04-26 LAB — CBC
HCT: 38.6 % — ABNORMAL LOW (ref 39.0–52.0)
Hemoglobin: 13 g/dL (ref 13.0–17.0)
MCH: 32.3 pg (ref 26.0–34.0)
MCHC: 33.7 g/dL (ref 30.0–36.0)
MCV: 96 fL (ref 80.0–100.0)
Platelets: 429 K/uL — ABNORMAL HIGH (ref 150–400)
RBC: 4.02 MIL/uL — ABNORMAL LOW (ref 4.22–5.81)
RDW: 14.7 % (ref 11.5–15.5)
WBC: 8.5 K/uL (ref 4.0–10.5)
nRBC: 0 % (ref 0.0–0.2)

## 2024-04-26 LAB — GLUCOSE, CAPILLARY: Glucose-Capillary: 97 mg/dL (ref 70–99)

## 2024-04-26 LAB — HEMOGLOBIN A1C
Hgb A1c MFr Bld: 5.8 % — ABNORMAL HIGH (ref 4.8–5.6)
Mean Plasma Glucose: 119.76 mg/dL

## 2024-04-26 LAB — SURGICAL PCR SCREEN
MRSA, PCR: NEGATIVE
Staphylococcus aureus: NEGATIVE

## 2024-04-26 MED ORDER — OXYCODONE-ACETAMINOPHEN 5-325 MG PO TABS: 1.0000 | ORAL_TABLET | Freq: Four times a day (QID) | ORAL | 0 refills | Status: DC | PRN

## 2024-04-26 NOTE — Progress Notes (Signed)
 PCP - Dr. Thedora Dunnings Cardiologist - Dr. Rober Chroman  PPM/ICD - denies   Chest x-ray - 02/03/24 EKG - 03/15/24 Stress Test - 06/30/02 ECHO - 01/17/24 Cardiac Cath - 12/27/18  Sleep Study - OSA+ CPAP - denies  DM- type 2, but pt does not check CBG at home and does not know typical fasting levels  Last dose of GLP1 agonist-  n/a   ASA/Blood Thinner Instructions: n/a   ERAS Protcol - clears until 0430 PRE-SURGERY G2- given  COVID TEST- n/a   Anesthesia review: yes, cardiac hx. Previously reviewed by Isaiah, PA. Pt's surgery was rescheduled  Patient denies shortness of breath, fever, cough and chest pain at PAT appointment   All instructions explained to the patient, with a verbal understanding of the material. Patient agrees to go over the instructions while at home for a better understanding.  The opportunity to ask questions was provided.

## 2024-04-26 NOTE — Telephone Encounter (Signed)
 Patient is scheduled for left total knee arthroplasty 05-05-24 and is at his preadmission visit at [facility] now. The RN Mardene at [phone]) doing his assessment has informed through secure chat this patient has misplaced his pain meds he was given for after surgery.

## 2024-04-26 NOTE — Telephone Encounter (Signed)
 sent

## 2024-04-26 NOTE — Pre-Procedure Instructions (Signed)
 Surgical Instructions   Your procedure is scheduled on May 05, 2024. Report to [facility] Main Entrance A at 5:30 A.M., then check in with the Admitting office. Any questions or running late day of surgery: call [phone]  Questions prior to your surgery date: call [phone], Monday-Friday, 8am-4pm. If you experience any cold or flu symptoms such as cough, fever, chills, shortness of breath, etc. between now and your scheduled surgery, please notify us  at the above number.     Remember:  Do not eat after midnight the night before your surgery  You may drink clear liquids until 4:30 AM the morning of your surgery.   Clear liquids allowed are: Water, Non-Citrus Juices (without pulp), Carbonated Beverages, Clear Tea (no milk, honey, etc.), Black Coffee Only (NO MILK, CREAM OR POWDERED CREAMER of any kind), and Gatorade.  Patient Instructions  The night before surgery:  No food after midnight. ONLY clear liquids after midnight  The day of surgery (if you have diabetes): Drink ONE (1) 12 oz G2 given to you in your pre admission testing appointment by 4:30 AM the morning of surgery. Drink in one sitting. Do not sip.  This drink was given to you during your hospital  pre-op appointment visit.  Nothing else to drink after completing the  12 oz bottle of G2.         If you have questions, please contact your surgeon's office.    Take these medicines the morning of surgery with A SIP OF WATER: atorvastatin  (LIPITOR)  carvedilol  (COREG )  DULoxetine  (CYMBALTA )   levothyroxine  (SYNTHROID )  pantoprazole  (PROTONIX )  rifaximin  (XIFAXAN )    May take these medicines IF NEEDED: albuterol  (VENTOLIN  HFA) inhaler - please bring inhaler with you morning of surgery ondansetron  (ZOFRAN )  Ubrogepant  (UBRELVY )  methocarbamol  (ROBAXIN -750)   Please follow your prescribing physician's instructions on if/when to stop taking naltrexone  (DEPADE).   One week prior to surgery, STOP taking  any Aspirin  (unless otherwise instructed by your surgeon) Aleve , Naproxen , Ibuprofen , Motrin , Advil , Goody's, BC's, all herbal medications, fish oil, and non-prescription vitamins.   WHAT DO I DO ABOUT MY DIABETES MEDICATION?   Do not take metFORMIN (GLUCOPHAGE) the morning of surgery.  STOP taking your FARXIGA  three days prior to surgery. Your last dose will be July 13th.       HOW TO MANAGE YOUR DIABETES BEFORE AND AFTER SURGERY  Why is it important to control my blood sugar before and after surgery? Improving blood sugar levels before and after surgery helps healing and can limit problems. A way of improving blood sugar control is eating a healthy diet by:  Eating less sugar and carbohydrates  Increasing activity/exercise  Talking with your doctor about reaching your blood sugar goals High blood sugars (greater than 180 mg/dL) can raise your risk of infections and slow your recovery, so you will need to focus on controlling your diabetes during the weeks before surgery. Make sure that the doctor who takes care of your diabetes knows about your planned surgery including the date and location.  How do I manage my blood sugar before surgery? Check your blood sugar at least 4 times a day, starting 2 days before surgery, to make sure that the level is not too high or low.  Check your blood sugar the morning of your surgery when you wake up and every 2 hours until you get to the Short Stay unit.  If your blood sugar is less than 70 mg/dL, you will need to treat  for low blood sugar: Do not take insulin. Treat a low blood sugar (less than 70 mg/dL) with  cup of clear juice (cranberry or apple), 4 glucose tablets, OR glucose gel. Recheck blood sugar in 15 minutes after treatment (to make sure it is greater than 70 mg/dL). If your blood sugar is not greater than 70 mg/dL on recheck, call [phone] for further instructions. Report your blood sugar to the short stay nurse when you get to  Short Stay.  If you are admitted to the hospital after surgery: Your blood sugar will be checked by the staff and you will probably be given insulin after surgery (instead of oral diabetes medicines) to make sure you have good blood sugar levels. The goal for blood sugar control after surgery is 80-180 mg/dL.                      Do NOT Smoke (Tobacco/Vaping) for 24 hours prior to your procedure.  If you use a CPAP at night, you may bring your mask/headgear for your overnight stay.   You will be asked to remove any contacts, glasses, piercing's, hearing aid's, dentures/partials prior to surgery. Please bring cases for these items if needed.    Patients discharged the day of surgery will not be allowed to drive home, and someone needs to stay with them for 24 hours.  SURGICAL WAITING ROOM VISITATION Patients may have no more than 2 support people in the waiting area - these visitors may rotate.   Pre-op nurse will coordinate an appropriate time for 1 ADULT support person, who may not rotate, to accompany patient in pre-op.  Children under the age of 75 must have an adult with them who is not the patient and must remain in the main waiting area with an adult.  If the patient needs to stay at the hospital during part of their recovery, the visitor guidelines for inpatient rooms apply.  Please refer to the [facility] website for the visitor guidelines for any additional information.   If you received a COVID test during your pre-op visit  it is requested that you wear a mask when out in public, stay away from anyone that may not be feeling well and notify your surgeon if you develop symptoms. If you have been in contact with anyone that has tested positive in the last 10 days please notify you surgeon.      Pre-operative 5 CHG Bathing Instructions   You can play a key role in reducing the risk of infection after surgery. Your skin needs to be as free of germs as possible. You can reduce  the number of germs on your skin by washing with CHG (chlorhexidine  gluconate) soap before surgery. CHG is an antiseptic soap that kills germs and continues to kill germs even after washing.   DO NOT use if you have an allergy to chlorhexidine /CHG or antibacterial soaps. If your skin becomes reddened or irritated, stop using the CHG and notify one of our RNs at [phone].   Please shower with the CHG soap starting 4 days before surgery using the following schedule:     Please keep in mind the following:  DO NOT shave, including legs and underarms, starting the day of your first shower.   You may shave your face at any point before/day of surgery.  Place clean sheets on your bed the day you start using CHG soap. Use a clean washcloth (not used since being washed) for each  shower. DO NOT sleep with pets once you start using the CHG.   CHG Shower Instructions:  Wash your face and private area with normal soap. If you choose to wash your hair, wash first with your normal shampoo.  After you use shampoo/soap, rinse your hair and body thoroughly to remove shampoo/soap residue.  Turn the water OFF and apply about 3 tablespoons (45 ml) of CHG soap to a CLEAN washcloth.  Apply CHG soap ONLY FROM YOUR NECK DOWN TO YOUR TOES (washing for 3-5 minutes)  DO NOT use CHG soap on face, private areas, open wounds, or sores.  Pay special attention to the area where your surgery is being performed.  If you are having back surgery, having someone wash your back for you may be helpful. Wait 2 minutes after CHG soap is applied, then you may rinse off the CHG soap.  Pat dry with a clean towel  Put on clean clothes/pajamas   If you choose to wear lotion, please use ONLY the CHG-compatible lotions that are listed below.  Additional instructions for the day of surgery: DO NOT APPLY any lotions, deodorants, cologne, or perfumes.   Do not bring valuables to the hospital. [facility] is not responsible for any  belongings/valuables. Do not wear nail polish, gel polish, artificial nails, or any other type of covering on natural nails (fingers and toes) Do not wear jewelry or makeup Put on clean/comfortable clothes.  Please brush your teeth.  Ask your nurse before applying any prescription medications to the skin.     CHG Compatible Lotions   Aveeno Moisturizing lotion  Cetaphil Moisturizing Cream  Cetaphil Moisturizing Lotion  Clairol Herbal Essence Moisturizing Lotion, Dry Skin  Clairol Herbal Essence Moisturizing Lotion, Extra Dry Skin  Clairol Herbal Essence Moisturizing Lotion, Normal Skin  Curel Age Defying Therapeutic Moisturizing Lotion with Alpha Hydroxy  Curel Extreme Care Body Lotion  Curel Soothing Hands Moisturizing Hand Lotion  Curel Therapeutic Moisturizing Cream, Fragrance-Free  Curel Therapeutic Moisturizing Lotion, Fragrance-Free  Curel Therapeutic Moisturizing Lotion, Original Formula  Eucerin Daily Replenishing Lotion  Eucerin Dry Skin Therapy Plus Alpha Hydroxy Crme  Eucerin Dry Skin Therapy Plus Alpha Hydroxy Lotion  Eucerin Original Crme  Eucerin Original Lotion  Eucerin Plus Crme Eucerin Plus Lotion  Eucerin TriLipid Replenishing Lotion  Keri Anti-Bacterial Hand Lotion  Keri Deep Conditioning Original Lotion Dry Skin Formula Softly Scented  Keri Deep Conditioning Original Lotion, Fragrance Free Sensitive Skin Formula  Keri Lotion Fast Absorbing Fragrance Free Sensitive Skin Formula  Keri Lotion Fast Absorbing Softly Scented Dry Skin Formula  Keri Original Lotion  Keri Skin Renewal Lotion Keri Silky Smooth Lotion  Keri Silky Smooth Sensitive Skin Lotion  Nivea Body Creamy Conditioning Oil  Nivea Body Extra Enriched Lotion  Nivea Body Original Lotion  Nivea Body Sheer Moisturizing Lotion Nivea Crme  Nivea Skin Firming Lotion  NutraDerm 30 Skin Lotion  NutraDerm Skin Lotion  NutraDerm Therapeutic Skin Cream  NutraDerm Therapeutic Skin Lotion  ProShield  Protective Hand Cream  Provon moisturizing lotion  Please read over the following fact sheets that you were given.

## 2024-04-27 ENCOUNTER — Ambulatory Visit: Admitting: Orthopaedic Surgery

## 2024-04-27 NOTE — Anesthesia Preprocedure Evaluation (Addendum)
 Anesthesia Evaluation  Patient identified by MRN, date of birth, ID band Patient awake    Reviewed: Allergy & Precautions, NPO status , Patient's Chart, lab work & pertinent test results  Airway Mallampati: III  TM Distance: >3 FB Neck ROM: Limited    Dental  (+) Edentulous Upper, Edentulous Lower, Dental Advisory Given   Pulmonary sleep apnea , Current Smoker and Patient abstained from smoking.   Pulmonary exam normal breath sounds clear to auscultation       Cardiovascular hypertension, Pt. on medications and Pt. on home beta blockers +CHF   Rhythm:Regular Rate:Normal  Echo 01/17/24: IMPRESSIONS  1. Left ventricular ejection fraction, by estimation, is 40 to 45%. The left ventricle has mildly decreased function. The left ventricle demonstrates global hypokinesis. Left ventricular diastolic parameters are consistent with Grade I diastolic dysfunction (impaired relaxation).  2. Right ventricular systolic function is normal. The right ventricular size is normal. Tricuspid regurgitation signal is inadequate for assessing PA pressure.  3. The mitral valve is normal in structure. No evidence of mitral valve regurgitation. No evidence of mitral stenosis.  4. The aortic valve is tricuspid. Aortic valve regurgitation is not visualized. No aortic stenosis is present.  5. There is borderline dilatation of the aortic root, measuring 39 mm.   There is borderline dilatation of the ascending aorta, measuring 40 mm.  6. The inferior vena cava is normal in size with greater than 50%  respiratory variability, suggesting right atrial pressure of 3 mmHg.  Comparison(s): Prior images reviewed side by side. The left ventricular function has improved.     Neuro/Psych  Headaches PSYCHIATRIC DISORDERS Anxiety Depression    Peripheral neuropathy  Neuromuscular disease    GI/Hepatic PUD,GERD  Medicated and Controlled,,(+) Cirrhosis     substance  abuse  alcohol useHx/o portal hypertensive gastropathy   Endo/Other  diabetesHypothyroidism  Hyperlipidemia  Renal/GU Renal InsufficiencyRenal disease     Musculoskeletal  (+) Arthritis , Osteoarthritis,    Abdominal  (+) + obese  Peds  Hematology  (+) Blood dyscrasia, anemia   Anesthesia Other Findings   Reproductive/Obstetrics                              Anesthesia Physical Anesthesia Plan  ASA: 4  Anesthesia Plan: Spinal   Post-op Pain Management: Tylenol  PO (pre-op)*, Regional block* and Gabapentin  PO (pre-op)*   Induction: Intravenous  PONV Risk Score and Plan: 1 and Propofol  infusion, Treatment may vary due to age or medical condition, TIVA, Midazolam , Ondansetron  and Dexamethasone   Airway Management Planned: Natural Airway and Simple Face Mask  Additional Equipment:   Intra-op Plan:   Post-operative Plan:   Informed Consent: I have reviewed the patients History and Physical, chart, labs and discussed the procedure including the risks, benefits and alternatives for the proposed anesthesia with the patient or authorized representative who has indicated his/her understanding and acceptance.     Dental advisory given  Plan Discussed with: CRNA  Anesthesia Plan Comments: (See PAT note written 04/27/2024 by Allison Zelenak, PA-C.  )         Anesthesia Quick Evaluation
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
## 2024-05-03 ENCOUNTER — Other Ambulatory Visit: Payer: Self-pay | Admitting: Physician Assistant

## 2024-05-04 ENCOUNTER — Telehealth: Payer: Self-pay | Admitting: Orthopaedic Surgery

## 2024-05-04 DIAGNOSIS — M1712 Unilateral primary osteoarthritis, left knee: Secondary | ICD-10-CM | POA: Insufficient documentation

## 2024-05-04 MED ORDER — TRANEXAMIC ACID 1000 MG/10ML IV SOLN
2000.0000 mg | INTRAVENOUS | Status: AC
Start: 2024-05-05 — End: 2024-05-06
  Filled 2024-05-04: qty 20

## 2024-05-04 NOTE — Telephone Encounter (Signed)
 He is having a total knee replacement tomorrow.  I have already sent in post-op pain meds so he can take one of those every 8 hours if needed

## 2024-05-04 NOTE — Telephone Encounter (Signed)
 Spoke with patient. He said that he doesn't have any pain medicine. Explained that the pain medicine was sent in on 04/26/2024 to [facility]. He said that they may have been stolen or lost, not sure. He wants them refilled. I explained that I would ask, but the bottle said to be taken AFTER surgery.

## 2024-05-04 NOTE — Telephone Encounter (Signed)
Patient called. He would like some pain medication called in for him.

## 2024-05-05 ENCOUNTER — Observation Stay (HOSPITAL_COMMUNITY): Payer: MEDICAID

## 2024-05-05 ENCOUNTER — Ambulatory Visit (HOSPITAL_COMMUNITY): Payer: MEDICAID | Admitting: Vascular Surgery

## 2024-05-05 ENCOUNTER — Other Ambulatory Visit: Payer: Self-pay

## 2024-05-05 ENCOUNTER — Observation Stay (HOSPITAL_COMMUNITY)
Admission: RE | Admit: 2024-05-05 | Discharge: 2024-05-06 | Disposition: A | Discharge status: Home / Self Care | Payer: MEDICAID | Attending: Orthopaedic Surgery | Admitting: Orthopaedic Surgery

## 2024-05-05 ENCOUNTER — Encounter (HOSPITAL_COMMUNITY): Payer: Self-pay | Admitting: Orthopaedic Surgery

## 2024-05-05 ENCOUNTER — Encounter (HOSPITAL_COMMUNITY)
Admission: RE | Disposition: A | Discharge status: Home / Self Care | Payer: Self-pay | Source: Home / Self Care | Attending: Orthopaedic Surgery

## 2024-05-05 ENCOUNTER — Other Ambulatory Visit (HOSPITAL_COMMUNITY): Payer: Self-pay

## 2024-05-05 ENCOUNTER — Other Ambulatory Visit: Payer: Self-pay | Admitting: Physician Assistant

## 2024-05-05 DIAGNOSIS — I5020 Unspecified systolic (congestive) heart failure: Secondary | ICD-10-CM | POA: Diagnosis not present

## 2024-05-05 DIAGNOSIS — Z96652 Presence of left artificial knee joint: Secondary | ICD-10-CM | POA: Insufficient documentation

## 2024-05-05 DIAGNOSIS — I110 Hypertensive heart disease with heart failure: Secondary | ICD-10-CM | POA: Insufficient documentation

## 2024-05-05 DIAGNOSIS — E119 Type 2 diabetes mellitus without complications: Secondary | ICD-10-CM | POA: Insufficient documentation

## 2024-05-05 DIAGNOSIS — F1090 Alcohol use, unspecified, uncomplicated: Secondary | ICD-10-CM | POA: Insufficient documentation

## 2024-05-05 DIAGNOSIS — M1712 Unilateral primary osteoarthritis, left knee: Principal | ICD-10-CM | POA: Insufficient documentation

## 2024-05-05 DIAGNOSIS — F17210 Nicotine dependence, cigarettes, uncomplicated: Secondary | ICD-10-CM | POA: Diagnosis not present

## 2024-05-05 DIAGNOSIS — I5022 Chronic systolic (congestive) heart failure: Secondary | ICD-10-CM

## 2024-05-05 HISTORY — PX: TOTAL KNEE ARTHROPLASTY: SHX125

## 2024-05-05 LAB — GLUCOSE, CAPILLARY
Glucose-Capillary: 84 mg/dL (ref 70–99)
Glucose-Capillary: 97 mg/dL (ref 70–99)
Glucose-Capillary: 122 mg/dL — ABNORMAL HIGH (ref 70–99)
Glucose-Capillary: 152 mg/dL — ABNORMAL HIGH (ref 70–99)
Glucose-Capillary: 119 mg/dL — ABNORMAL HIGH (ref 70–99)

## 2024-05-05 SURGERY — ARTHROPLASTY, KNEE, TOTAL
Anesthesia: Spinal | Site: Knee | Laterality: Left

## 2024-05-05 MED ORDER — MIDAZOLAM HCL 2 MG/2ML IJ SOLN
INTRAMUSCULAR | Status: DC | PRN
  Administered 2024-05-05 (×2): 1 mg via INTRAVENOUS

## 2024-05-05 MED ORDER — DOCUSATE SODIUM 100 MG PO CAPS
100.0000 mg | ORAL_CAPSULE | Freq: Two times a day (BID) | ORAL | Status: DC
  Administered 2024-05-05 – 2024-05-06 (×3): 100 mg via ORAL
  Filled 2024-05-05 (×3): qty 1

## 2024-05-05 MED ORDER — CEFAZOLIN SODIUM-DEXTROSE 2-4 GM/100ML-% IV SOLN
2.0000 g | Freq: Four times a day (QID) | INTRAVENOUS | Status: AC
  Administered 2024-05-05 (×2): 2 g via INTRAVENOUS
  Filled 2024-05-05 (×2): qty 100

## 2024-05-05 MED ORDER — ORAL CARE MOUTH RINSE: 15.0000 mL | Freq: Once | OROMUCOSAL | Status: AC

## 2024-05-05 MED ORDER — ONDANSETRON HCL 4 MG PO TABS: 4.0000 mg | ORAL_TABLET | Freq: Four times a day (QID) | ORAL | Status: DC | PRN

## 2024-05-05 MED ORDER — BUPIVACAINE-MELOXICAM ER 400-12 MG/14ML IJ SOLN
INTRAMUSCULAR | Status: AC
  Filled 2024-05-05: qty 1

## 2024-05-05 MED ORDER — GABAPENTIN 300 MG PO CAPS: 300.0000 mg | ORAL_CAPSULE | Freq: Once | ORAL | Status: AC

## 2024-05-05 MED ORDER — TRANEXAMIC ACID-NACL 1000-0.7 MG/100ML-% IV SOLN
1000.0000 mg | Freq: Once | INTRAVENOUS | Status: AC
Start: 2024-05-05 — End: 2024-05-06
  Administered 2024-05-05: 1000 mg via INTRAVENOUS
  Filled 2024-05-05: qty 100

## 2024-05-05 MED ORDER — HYDROMORPHONE HCL 1 MG/ML IJ SOLN: 0.5000 mg | INTRAMUSCULAR | Status: DC | PRN

## 2024-05-05 MED ORDER — ACETAMINOPHEN 500 MG PO TABS: 500.0000 mg | ORAL_TABLET | Freq: Once | ORAL | Status: AC

## 2024-05-05 MED ORDER — VANCOMYCIN HCL 1000 MG IV SOLR
INTRAVENOUS | Status: DC | PRN
Start: 2024-05-05 — End: 2024-05-05
  Administered 2024-05-05: 1000 mg via TOPICAL

## 2024-05-05 MED ORDER — KETOROLAC TROMETHAMINE 15 MG/ML IJ SOLN
7.5000 mg | Freq: Four times a day (QID) | INTRAMUSCULAR | Status: AC
  Administered 2024-05-05 – 2024-05-06 (×4): 7.5 mg via INTRAVENOUS
  Filled 2024-05-05 (×4): qty 1

## 2024-05-05 MED ORDER — APIXABAN 2.5 MG PO TABS
ORAL_TABLET | ORAL | 0 refills | Status: DC
  Filled 2024-05-05: qty 60, 30d supply, fill #0

## 2024-05-05 MED ORDER — PHENYLEPHRINE 80 MCG/ML (10ML) SYRINGE FOR IV PUSH (FOR BLOOD PRESSURE SUPPORT)
PREFILLED_SYRINGE | INTRAVENOUS | Status: DC | PRN
  Administered 2024-05-05 (×3): 80 ug via INTRAVENOUS

## 2024-05-05 MED ORDER — METHOCARBAMOL 500 MG PO TABS
500.0000 mg | ORAL_TABLET | Freq: Four times a day (QID) | ORAL | Status: DC | PRN
  Administered 2024-05-05: 500 mg via ORAL
  Filled 2024-05-05: qty 1

## 2024-05-05 MED ORDER — ONDANSETRON HCL 4 MG/2ML IJ SOLN
INTRAMUSCULAR | Status: DC | PRN
  Administered 2024-05-05: 4 mg via INTRAVENOUS

## 2024-05-05 MED ORDER — INSULIN ASPART 100 UNIT/ML IJ SOLN: 0.0000 [IU] | Freq: Every day | INTRAMUSCULAR | Status: DC

## 2024-05-05 MED ORDER — METOPROLOL SUCCINATE ER 50 MG PO TB24
50.0000 mg | ORAL_TABLET | Freq: Every day | ORAL | Status: DC
Start: 2024-05-06 — End: 2024-05-06
  Administered 2024-05-06: 50 mg via ORAL
  Filled 2024-05-05: qty 1

## 2024-05-05 MED ORDER — HYDROMORPHONE HCL 1 MG/ML IJ SOLN: 0.2500 mg | INTRAMUSCULAR | Status: DC | PRN

## 2024-05-05 MED ORDER — MENTHOL 3 MG MT LOZG: 1.0000 | LOZENGE | OROMUCOSAL | Status: DC | PRN

## 2024-05-05 MED ORDER — PHENYLEPHRINE HCL-NACL 20-0.9 MG/250ML-% IV SOLN
INTRAVENOUS | Status: DC | PRN
  Administered 2024-05-05: 30 ug/min via INTRAVENOUS

## 2024-05-05 MED ORDER — ACETAMINOPHEN 325 MG PO TABS: 325.0000 mg | ORAL_TABLET | Freq: Four times a day (QID) | ORAL | Status: DC | PRN

## 2024-05-05 MED ORDER — ACETAMINOPHEN 500 MG PO TABS
1000.0000 mg | ORAL_TABLET | Freq: Four times a day (QID) | ORAL | Status: AC
  Administered 2024-05-05 – 2024-05-06 (×4): 1000 mg via ORAL
  Filled 2024-05-05 (×4): qty 2

## 2024-05-05 MED ORDER — EPHEDRINE SULFATE-NACL 50-0.9 MG/10ML-% IV SOSY
PREFILLED_SYRINGE | INTRAVENOUS | Status: DC | PRN
  Administered 2024-05-05 (×2): 5 mg via INTRAVENOUS
  Administered 2024-05-05: 10 mg via INTRAVENOUS

## 2024-05-05 MED ORDER — FENTANYL CITRATE (PF) 250 MCG/5ML IJ SOLN
INTRAMUSCULAR | Status: AC
  Filled 2024-05-05: qty 5

## 2024-05-05 MED ORDER — METOCLOPRAMIDE HCL 5 MG PO TABS: 5.0000 mg | ORAL_TABLET | Freq: Three times a day (TID) | ORAL | Status: DC | PRN

## 2024-05-05 MED ORDER — TRANEXAMIC ACID-NACL 1000-0.7 MG/100ML-% IV SOLN
1000.0000 mg | INTRAVENOUS | Status: AC
  Administered 2024-05-05: 1000 mg via INTRAVENOUS
  Filled 2024-05-05: qty 100

## 2024-05-05 MED ORDER — ACETAMINOPHEN 500 MG PO TABS
ORAL_TABLET | ORAL | Status: AC
  Administered 2024-05-05: 500 mg via ORAL
  Filled 2024-05-05: qty 1

## 2024-05-05 MED ORDER — VANCOMYCIN HCL 1000 MG IV SOLR
INTRAVENOUS | Status: AC
  Filled 2024-05-05: qty 20

## 2024-05-05 MED ORDER — LEVOTHYROXINE SODIUM 88 MCG PO TABS
88.0000 ug | ORAL_TABLET | Freq: Every day | ORAL | Status: DC
  Administered 2024-05-06: 88 ug via ORAL
  Filled 2024-05-05: qty 1

## 2024-05-05 MED ORDER — FENTANYL CITRATE (PF) 250 MCG/5ML IJ SOLN
INTRAMUSCULAR | Status: DC | PRN
  Administered 2024-05-05 (×3): 50 ug via INTRAVENOUS

## 2024-05-05 MED ORDER — INSULIN ASPART 100 UNIT/ML IJ SOLN: 0.0000 [IU] | INTRAMUSCULAR | Status: DC | PRN

## 2024-05-05 MED ORDER — GABAPENTIN 300 MG PO CAPS
ORAL_CAPSULE | ORAL | Status: AC
  Administered 2024-05-05: 300 mg via ORAL
  Filled 2024-05-05: qty 1

## 2024-05-05 MED ORDER — ONDANSETRON HCL 4 MG/2ML IJ SOLN: 4.0000 mg | Freq: Four times a day (QID) | INTRAMUSCULAR | Status: DC | PRN

## 2024-05-05 MED ORDER — HYDROMORPHONE HCL 2 MG PO TABS: 2.0000 mg | ORAL_TABLET | Freq: Four times a day (QID) | ORAL | 0 refills | Status: AC | PRN

## 2024-05-05 MED ORDER — OXYCODONE HCL 5 MG PO TABS
10.0000 mg | ORAL_TABLET | ORAL | Status: DC | PRN
  Administered 2024-05-05 – 2024-05-06 (×2): 10 mg via ORAL
  Filled 2024-05-05 (×4): qty 2

## 2024-05-05 MED ORDER — CEFAZOLIN SODIUM-DEXTROSE 2-4 GM/100ML-% IV SOLN
2.0000 g | INTRAVENOUS | Status: AC
  Administered 2024-05-05: 2 g via INTRAVENOUS
  Filled 2024-05-05: qty 100

## 2024-05-05 MED ORDER — PROPOFOL 10 MG/ML IV BOLUS
INTRAVENOUS | Status: DC | PRN
  Administered 2024-05-05: 30 mg via INTRAVENOUS

## 2024-05-05 MED ORDER — POVIDONE-IODINE 10 % EX SWAB
2.0000 | Freq: Once | CUTANEOUS | Status: AC
  Administered 2024-05-05: 2 via TOPICAL

## 2024-05-05 MED ORDER — APIXABAN 2.5 MG PO TABS
2.5000 mg | ORAL_TABLET | Freq: Two times a day (BID) | ORAL | Status: DC
  Administered 2024-05-06: 2.5 mg via ORAL
  Filled 2024-05-05 (×2): qty 1

## 2024-05-05 MED ORDER — DROPERIDOL 2.5 MG/ML IJ SOLN: 0.6250 mg | Freq: Once | INTRAMUSCULAR | Status: DC | PRN

## 2024-05-05 MED ORDER — OXYCODONE HCL 5 MG PO TABS
5.0000 mg | ORAL_TABLET | ORAL | Status: DC | PRN
  Administered 2024-05-05: 5 mg via ORAL

## 2024-05-05 MED ORDER — 0.9 % SODIUM CHLORIDE (POUR BTL) OPTIME
TOPICAL | Status: DC | PRN
  Administered 2024-05-05: 1000 mL

## 2024-05-05 MED ORDER — MIDAZOLAM HCL 2 MG/2ML IJ SOLN
INTRAMUSCULAR | Status: AC
  Filled 2024-05-05: qty 2

## 2024-05-05 MED ORDER — SODIUM CHLORIDE 0.9 % IR SOLN
Status: DC | PRN
  Administered 2024-05-05: 3000 mL

## 2024-05-05 MED ORDER — LACTATED RINGERS IV SOLN: INTRAVENOUS | Status: DC

## 2024-05-05 MED ORDER — PHENOL 1.4 % MT LIQD
1.0000 | OROMUCOSAL | Status: DC | PRN
Start: 2024-05-05 — End: 2024-05-06

## 2024-05-05 MED ORDER — DEXAMETHASONE SODIUM PHOSPHATE 10 MG/ML IJ SOLN
10.0000 mg | Freq: Once | INTRAMUSCULAR | Status: AC
  Administered 2024-05-06: 10 mg via INTRAVENOUS
  Filled 2024-05-05: qty 1

## 2024-05-05 MED ORDER — CHLORHEXIDINE GLUCONATE 0.12 % MT SOLN
15.0000 mL | Freq: Once | OROMUCOSAL | Status: AC
  Administered 2024-05-05: 15 mL via OROMUCOSAL

## 2024-05-05 MED ORDER — OXYCODONE HCL ER 10 MG PO T12A
10.0000 mg | EXTENDED_RELEASE_TABLET | Freq: Two times a day (BID) | ORAL | Status: DC
  Administered 2024-05-05 – 2024-05-06 (×3): 10 mg via ORAL
  Filled 2024-05-05 (×3): qty 1

## 2024-05-05 MED ORDER — QUETIAPINE FUMARATE 200 MG PO TABS
200.0000 mg | ORAL_TABLET | Freq: Every day | ORAL | Status: DC
  Administered 2024-05-05: 200 mg via ORAL
  Filled 2024-05-05 (×2): qty 1

## 2024-05-05 MED ORDER — TRANEXAMIC ACID 1000 MG/10ML IV SOLN
INTRAVENOUS | Status: DC | PRN
  Administered 2024-05-05: 2000 mg via TOPICAL

## 2024-05-05 MED ORDER — METHOCARBAMOL 1000 MG/10ML IJ SOLN: 500.0000 mg | Freq: Four times a day (QID) | INTRAMUSCULAR | Status: DC | PRN

## 2024-05-05 MED ORDER — PRONTOSAN WOUND IRRIGATION OPTIME
TOPICAL | Status: DC | PRN
  Administered 2024-05-05: 500 mL via TOPICAL

## 2024-05-05 MED ORDER — METOCLOPRAMIDE HCL 5 MG/ML IJ SOLN: 5.0000 mg | Freq: Three times a day (TID) | INTRAMUSCULAR | Status: DC | PRN

## 2024-05-05 MED ORDER — TOPIRAMATE 25 MG PO TABS
50.0000 mg | ORAL_TABLET | Freq: Two times a day (BID) | ORAL | Status: DC
  Administered 2024-05-05 – 2024-05-06 (×3): 50 mg via ORAL
  Filled 2024-05-05 (×3): qty 2

## 2024-05-05 MED ORDER — PROPOFOL 500 MG/50ML IV EMUL
INTRAVENOUS | Status: DC | PRN
  Administered 2024-05-05: 100 ug/kg/min via INTRAVENOUS

## 2024-05-05 MED ORDER — INSULIN ASPART 100 UNIT/ML IJ SOLN: 0.0000 [IU] | Freq: Three times a day (TID) | INTRAMUSCULAR | Status: DC

## 2024-05-05 SURGICAL SUPPLY — 70 items
ALCOHOL 70% 16 OZ (MISCELLANEOUS) ×2 IMPLANT
BAG COUNTER SPONGE SURGICOUNT (BAG) IMPLANT
BAG DECANTER FOR FLEXI CONT (MISCELLANEOUS) ×2 IMPLANT
BANDAGE ESMARK 6X9 LF (GAUZE/BANDAGES/DRESSINGS) IMPLANT
BLADE SAG 18X100X1.27 (BLADE) ×2 IMPLANT
BLADE SAW SGTL 73X25 THK (BLADE) ×2 IMPLANT
BOWL SMART MIX CTS (DISPOSABLE) ×2 IMPLANT
CLSR STERI-STRIP ANTIMIC 1/2X4 (GAUZE/BANDAGES/DRESSINGS) IMPLANT
COMPONENT FEM PS KN STD 10 LT (Knees) IMPLANT
COMPONENT PATELLA 3 PEG 35 (Joint) IMPLANT
COMPONENT TIB KNEE PS 0D LT (Joint) IMPLANT
COOLER ICEMAN CLASSIC (MISCELLANEOUS) ×2 IMPLANT
COVER SURGICAL LIGHT HANDLE (MISCELLANEOUS) ×2 IMPLANT
CUFF TOURN SGL QUICK 42 (TOURNIQUET CUFF) IMPLANT
CUFF TRNQT CYL 34X4.125X (TOURNIQUET CUFF) ×2 IMPLANT
DERMABOND ADVANCED .7 DNX12 (GAUZE/BANDAGES/DRESSINGS) ×2 IMPLANT
DRAPE EXTREMITY T 121X128X90 (DISPOSABLE) ×2 IMPLANT
DRAPE HALF SHEET 40X57 (DRAPES) ×2 IMPLANT
DRAPE INCISE IOBAN 66X45 STRL (DRAPES) ×2 IMPLANT
DRAPE POUCH INSTRU U-SHP 10X18 (DRAPES) ×2 IMPLANT
DRAPE SURG ORHT 6 SPLT 77X108 (DRAPES) IMPLANT
DRAPE U-SHAPE 47X51 STRL (DRAPES) ×4 IMPLANT
DRSG AQUACEL AG ADV 3.5X10 (GAUZE/BANDAGES/DRESSINGS) ×2 IMPLANT
DURAPREP 26ML APPLICATOR (WOUND CARE) ×6 IMPLANT
ELECT CAUTERY BLADE 6.4 (BLADE) ×2 IMPLANT
ELECT PENCIL ROCKER SW 15FT (MISCELLANEOUS) ×2 IMPLANT
ELECTRODE REM PT RTRN 9FT ADLT (ELECTROSURGICAL) ×2 IMPLANT
GLOVE BIOGEL PI IND STRL 7.0 (GLOVE) ×4 IMPLANT
GLOVE BIOGEL PI IND STRL 7.5 (GLOVE) ×10 IMPLANT
GLOVE ECLIPSE 7.0 STRL STRAW (GLOVE) ×6 IMPLANT
GLOVE INDICATOR 7.0 STRL GRN (GLOVE) ×2 IMPLANT
GLOVE INDICATOR 7.5 STRL GRN (GLOVE) ×2 IMPLANT
GLOVE SURG SYN 7.5 PF PI (GLOVE) ×4 IMPLANT
GLOVE SURG UNDER LTX SZ7.5 (GLOVE) ×4 IMPLANT
GLOVE SURG UNDER POLY LF SZ7 (GLOVE) ×4 IMPLANT
GOWN STRL REUS W/ TWL LRG LVL3 (GOWN DISPOSABLE) ×2 IMPLANT
GOWN STRL SURGICAL XL XLNG (GOWN DISPOSABLE) IMPLANT
GOWN TOGA ZIPPER T7+ PEEL AWAY (MISCELLANEOUS) ×2 IMPLANT
GUIDE TRIAL TIB 4-5 PERSONA IMPLANT
HOOD PEEL AWAY T7 (MISCELLANEOUS) ×2 IMPLANT
KIT BASIN OR (CUSTOM PROCEDURE TRAY) ×2 IMPLANT
KIT TURNOVER KIT B (KITS) ×2 IMPLANT
MANIFOLD NEPTUNE II (INSTRUMENTS) ×2 IMPLANT
MARKER SKIN DUAL TIP RULER LAB (MISCELLANEOUS) ×4 IMPLANT
NDL SPNL 18GX3.5 QUINCKE PK (NEEDLE) ×2 IMPLANT
NEEDLE SPNL 18GX3.5 QUINCKE PK (NEEDLE) ×1 IMPLANT
NS IRRIG 1000ML POUR BTL (IV SOLUTION) ×2 IMPLANT
PACK TOTAL JOINT (CUSTOM PROCEDURE TRAY) ×2 IMPLANT
PAD ARMBOARD POSITIONER FOAM (MISCELLANEOUS) ×4 IMPLANT
PAD COLD SHLDR WRAP-ON (PAD) ×2 IMPLANT
PIN DRILL HDLS TROCAR 75 4PK (PIN) IMPLANT
SCREW FEMALE HEX FIX 25X2.5 (ORTHOPEDIC DISPOSABLE SUPPLIES) IMPLANT
SET HNDPC FAN SPRY TIP SCT (DISPOSABLE) ×2 IMPLANT
SOLUTION PRONTOSAN WOUND 350ML (IRRIGATION / IRRIGATOR) ×2 IMPLANT
STAPLER SKIN PROX 35W (STAPLE) IMPLANT
STEM TIBIAL 10 8-11 EF POLY LT (Joint) IMPLANT
SUCTION TUBE FRAZIER 10FR DISP (SUCTIONS) IMPLANT
SUT ETHILON 2 0 FS 18 (SUTURE) IMPLANT
SUT MNCRL AB 3-0 PS2 27 (SUTURE) IMPLANT
SUT STRATAFIX PDS+ 0 24IN (SUTURE) IMPLANT
SUT VIC AB 0 CT1 27XBRD ANBCTR (SUTURE) ×4 IMPLANT
SUT VIC AB 1 CTX 27 (SUTURE) ×6 IMPLANT
SUT VIC AB 2-0 CT1 TAPERPNT 27 (SUTURE) ×8 IMPLANT
SYR 50ML LL SCALE MARK (SYRINGE) ×4 IMPLANT
TOWEL GREEN STERILE (TOWEL DISPOSABLE) ×2 IMPLANT
TOWEL GREEN STERILE FF (TOWEL DISPOSABLE) ×2 IMPLANT
TRAY CATH INTERMITTENT SS 16FR (CATHETERS) IMPLANT
TUBE SUCT ARGYLE STRL (TUBING) ×2 IMPLANT
UNDERPAD 30X36 HEAVY ABSORB (UNDERPADS AND DIAPERS) ×2 IMPLANT
YANKAUER SUCT BULB TIP NO VENT (SUCTIONS) ×2 IMPLANT

## 2024-05-05 NOTE — Evaluation (Signed)
 Physical Therapy Evaluation Patient Details Name: Jacob Adams MRN: [id number] DOB: Mar 18, 1966 Today's Date: 05/05/2024  History of Present Illness  Pt is a 58 y/o male presenting 7/17 for L knee OA, s/p L TKA.  PMHx:DM, HTN, sleep apnea, CHF  Clinical Impression  Pt admitted with/for L TKA.  Pt having difficulty w/bearing on L LE post sx needing maximal assist to bear weight and min assist overall STS.  Pt currently limited functionally due to the problems listed below.  (see problems list.)  Pt will benefit from PT to maximize function and safety to be able to get home safely with available assist.         If plan is discharge home, recommend the following: Other (comment) (PRN)   Can travel by private vehicle        Equipment Recommendations Rolling walker (2 wheels)  Recommendations for Other Services       Functional Status Assessment Patient has had a recent decline in their functional status and demonstrates the ability to make significant improvements in function in a reasonable and predictable amount of time.     Precautions / Restrictions Precautions Precautions: Knee Precaution Booklet Issued: Yes (comment) (exercises given) Recall of Precautions/Restrictions: Intact Restrictions Weight Bearing Restrictions Per Provider Order: Yes LLE Weight Bearing Per Provider Order: Weight bearing as tolerated      Mobility  Bed Mobility Overal bed mobility: Needs Assistance Bed Mobility: Supine to Sit, Sit to Supine     Supine to sit: Contact guard Sit to supine: Contact guard assist   General bed mobility comments: lifts and maneuvers his own L LE    Transfers Overall transfer level: Needs assistance   Transfers: Sit to/from Stand Sit to Stand: Min assist, Mod assist (initially Mod)           General transfer comment: cues for sequencing/technique.  boost more than forward assist    Ambulation/Gait   Gait Distance (Feet): 2 Feet (forward and back with RW  and L knee buckle)         Pre-gait activities: accepting weight/ unweighting activity on L LE    Stairs            Wheelchair Mobility     Tilt Bed    Modified Rankin (Stroke Patients Only)       Balance Overall balance assessment: Needs assistance Sitting-balance support: No upper extremity supported, Feet supported Sitting balance-Leahy Scale: Good     Standing balance support: Bilateral upper extremity supported Standing balance-Leahy Scale: Poor                               Pertinent Vitals/Pain Pain Assessment Pain Assessment: Faces Faces Pain Scale: Hurts little more Pain Location: L knee Pain Descriptors / Indicators: Discomfort, Guarding Pain Intervention(s): Limited activity within patient's tolerance, Monitored during session    Home Living Family/patient expects to be discharged to:: Private residence Living Arrangements: Children;Other relatives Available Help at Discharge: Family;Available 24 hours/day Type of Home: House Home Access: Stairs to enter   [employer] of Steps: 1   Home Layout: One level;Laundry or work area in basement;Able to live on main level with bedroom/bathroom Home Equipment: None Additional Comments: cpap    Prior Function Prior Level of Function : Independent/Modified Independent;Driving             Mobility Comments: no AD use ADLs Comments: ind     Extremity/Trunk Assessment  Upper Extremity Assessment Upper Extremity Assessment: Overall WFL for tasks assessed    Lower Extremity Assessment Lower Extremity Assessment: LLE deficits/detail LLE Deficits / Details: limited movement without self assist.  pt unable to sustain knee extension in w/bearing. LLE Coordination: decreased fine motor    Cervical / Trunk Assessment Cervical / Trunk Assessment: Normal  Communication   Communication Communication: No apparent difficulties    Cognition Arousal: Alert Behavior During  Therapy: WFL for tasks assessed/performed   PT - Cognitive impairments: No apparent impairments                                 Cueing Cueing Techniques: Verbal cues     General Comments      Exercises Total Joint Exercises Ankle Circles/Pumps: AROM, 15 reps, Supine Quad Sets: AROM, AAROM, Supine Short Arc Quad: Other (comment) (demo exercise) Heel Slides: 10 reps, AAROM, Supine, Left Hip ABduction/ADduction: AAROM, Left, 5 reps, Supine Straight Leg Raises: Other (comment) (demo exercise) Knee Flexion: AROM, Left, 10 reps, Seated Goniometric ROM: 90 deg   Assessment/Plan    PT Assessment Patient needs continued PT services  PT Problem List         PT Treatment Interventions Gait training;Functional mobility training;Therapeutic activities;Patient/family education;DME instruction;Stair training    PT Goals (Current goals can be found in the Care Plan section)  Acute Rehab PT Goals Patient Stated Goal: Independence PT Goal Formulation: With patient Time For Goal Achievement: 05/12/24 Potential to Achieve Goals: Good    Frequency 7X/week     Co-evaluation PT/OT/SLP Co-Evaluation/Treatment: Yes             AM-PAC PT 6 Clicks Mobility  Outcome Measure Help needed turning from your back to your side while in a flat bed without using bedrails?: None Help needed moving from lying on your back to sitting on the side of a flat bed without using bedrails?: None Help needed moving to and from a bed to a chair (including a wheelchair)?: A Little Help needed standing up from a chair using your arms (e.g., wheelchair or bedside chair)?: A Little Help needed to walk in hospital room?: A Lot Help needed climbing 3-5 steps with a railing? : A Lot 6 Click Score: 18    End of Session   Activity Tolerance:  (limited by L knee buckling) Patient left: in bed;with call bell/phone within reach Nurse Communication: Mobility status PT Visit Diagnosis: Other  abnormalities of gait and mobility (R26.89);Pain;Difficulty in walking, not elsewhere classified (R26.2) Pain - Right/Left: Left Pain - part of body: Knee    Time: 1325-1350 PT Time Calculation (min) (ACUTE ONLY): 25 min   Charges:   PT Evaluation $PT Eval Moderate Complexity: 1 Mod PT Treatments $Therapeutic Activity: 8-22 mins PT General Charges $$ ACUTE PT VISIT: 1 Visit         05/05/2024  [place] HERO., PT Acute Rehabilitation Services [phone]  (office)  Jacob Adams 05/05/2024, 2:56 PM

## 2024-05-05 NOTE — Progress Notes (Signed)
 Orthopedic Tech Progress Note Patient Details:  Jacob Adams Dec 23, 1965 [id number]  Ortho Devices Type of Ortho Device: Bone foam zero knee Ortho Device/Splint Location: LLE Ortho Device/Splint Interventions: Application, Ordered   Post Interventions Patient Tolerated: Well  Jacob Adams A Jacob Adams 05/05/2024, 11:13 AM

## 2024-05-05 NOTE — Anesthesia Procedure Notes (Addendum)
 Spinal  Patient location during procedure: OR Start time: 05/05/2024 7:31 AM End time: 05/05/2024 7:36 AM Reason for block: surgical anesthesia Staffing Performed: anesthesiologist  Anesthesiologist: Darlyn Rush, MD Performed by: Darlyn Rush, MD Authorized by: Darlyn Rush, MD   Preanesthetic Checklist Completed: patient identified, IV checked, site marked, risks and benefits discussed, surgical consent, monitors and equipment checked, pre-op evaluation and timeout performed Spinal Block Patient position: sitting Prep: DuraPrep and site prepped and draped Patient monitoring: heart rate, continuous pulse ox and blood pressure Approach: midline Location: L3-4 Injection technique: single-shot Needle Needle type: Spinocan  Needle gauge: 25 G Needle length: 9 cm Additional Notes Expiration date of kit checked and confirmed. Patient tolerated procedure well, without complications.

## 2024-05-05 NOTE — Evaluation (Signed)
 Occupational Therapy Evaluation Patient Details Name: Jacob Adams MRN: [id number] DOB: 1966-04-23 Today's Date: 05/05/2024   History of Present Illness   Pt is a 58 y/o male presenting 7/17 for L knee OA, s/p L TKA.  PMHx:DM, HTN, sleep apnea, CHF     Clinical Impressions Pt admitted based on above, and was seen based on problem list below. PTA pt was independent with ADLs and IADLs. Today pt is requiring set up  to min assist for ADLs. Bed mobility was CGA and functional transfers are  min assist with RW. Pt continues to have L knee buckling while attempting to march in place, able to transfer to chair with gliding of L foot, instead of steps. Educated pt on use of compensatory strategies for LB ADLs, as well as use of DME for transfers.  Pt with adequate family support available at d/c no follow up OT needs.. OT will continue to follow acutely to maximize functional independence. Future sessions will continue to improve functional transfers as well as review LB dressing compensatory strategies.        If plan is discharge home, recommend the following:   A little help with walking and/or transfers;A little help with bathing/dressing/bathroom;Assistance with cooking/housework     Functional Status Assessment   Patient has had a recent decline in their functional status and demonstrates the ability to make significant improvements in function in a reasonable and predictable amount of time.     Equipment Recommendations   Tub/shower seat;BSC/3in1     Recommendations for Other Services         Precautions/Restrictions   Precautions Precautions: Knee Precaution Booklet Issued: Yes (comment) Recall of Precautions/Restrictions: Intact Restrictions Weight Bearing Restrictions Per Provider Order: Yes LLE Weight Bearing Per Provider Order: Weight bearing as tolerated     Mobility Bed Mobility Overal bed mobility: Needs Assistance Bed Mobility: Supine to Sit      Supine to sit: Contact guard     General bed mobility comments: Increased time    Transfers Overall transfer level: Needs assistance Equipment used: Rolling walker (2 wheels) Transfers: Sit to/from Stand, Bed to chair/wheelchair/BSC Sit to Stand: Contact guard assist, From elevated surface     Step pivot transfers: Min assist     General transfer comment: From elevated surfaces CGA for STS, pt able to slide feet for stand pivot to recliner, pt taking 8 steps in place before L knee buckling      Balance Overall balance assessment: Needs assistance Sitting-balance support: No upper extremity supported, Feet supported Sitting balance-Leahy Scale: Good     Standing balance support: Bilateral upper extremity supported, During functional activity, Reliant on assistive device for balance Standing balance-Leahy Scale: Poor Standing balance comment: Heavy reliance on BUE       ADL either performed or assessed with clinical judgement   ADL Overall ADL's : Needs assistance/impaired Eating/Feeding: Set up;Sitting   Grooming: Set up;Sitting           Upper Body Dressing : Set up;Sitting   Lower Body Dressing: Minimal assistance;Sit to/from stand Lower Body Dressing Details (indicate cue type and reason): Able to reach BLEs, min assist to steady in standing Toilet Transfer: Contact guard assist;Stand-pivot;Rolling walker (2 wheels) Toilet Transfer Details (indicate cue type and reason): Simulated in room, pt gliding feet for transfer Toileting- Clothing Manipulation and Hygiene: Minimal assistance;Sit to/from stand Toileting - Clothing Manipulation Details (indicate cue type and reason): Min steadying assist in standing     Functional mobility during  ADLs: Supervision/safety General ADL Comments: Pt with heavy reliance on BUE during standing     Vision Baseline Vision/History: 0 No visual deficits Vision Assessment?: No apparent visual deficits            Pertinent  Vitals/Pain Pain Assessment Pain Assessment: 0-10 Pain Score: 8  Pain Location: L knee Pain Descriptors / Indicators: Discomfort, Guarding Pain Intervention(s): Repositioned     Extremity/Trunk Assessment Upper Extremity Assessment Upper Extremity Assessment: Overall WFL for tasks assessed   Lower Extremity Assessment Lower Extremity Assessment: Defer to PT evaluation LLE Deficits / Details: limited movement without self assist.  pt unable to sustain knee extension in w/bearing. LLE Coordination: decreased fine motor   Cervical / Trunk Assessment Cervical / Trunk Assessment: Normal   Communication Communication Communication: No apparent difficulties   Cognition Arousal: Alert Behavior During Therapy: WFL for tasks assessed/performed Cognition: No apparent impairments     Following commands: Intact       Cueing  General Comments   Cueing Techniques: Verbal cues  RN notified of L knee buckling           Home Living Family/patient expects to be discharged to:: Private residence Living Arrangements: Children;Other relatives Available Help at Discharge: Family;Available 24 hours/day Type of Home: House Home Access: Stairs to enter [employer] of Steps: 1   Home Layout: One level;Laundry or work area in basement;Able to live on main level with bedroom/bathroom     Bathroom Shower/Tub: Producer, television/film/video: Pharmacist, community: Yes How Accessible: Accessible via walker Home Equipment: None   Additional Comments: cpap      Prior Functioning/Environment Prior Level of Function : Independent/Modified Independent;Driving       Mobility Comments: no AD use ADLs Comments: ind    OT Problem List: Decreased strength;Decreased range of motion;Decreased activity tolerance;Impaired balance (sitting and/or standing);Decreased safety awareness;Decreased knowledge of use of DME or AE   OT Treatment/Interventions: Self-care/ADL  training;Therapeutic exercise;Energy conservation;DME and/or AE instruction;Therapeutic activities;Patient/family education;Balance training      OT Goals(Current goals can be found in the care plan section)   Acute Rehab OT Goals Patient Stated Goal: To get better OT Goal Formulation: With patient Time For Goal Achievement: 05/19/24 Potential to Achieve Goals: Good   OT Frequency:  Min 2X/week       AM-PAC OT 6 Clicks Daily Activity     Outcome Measure Help from another person eating meals?: None Help from another person taking care of personal grooming?: A Little Help from another person toileting, which includes using toliet, bedpan, or urinal?: A Little Help from another person bathing (including washing, rinsing, drying)?: A Little Help from another person to put on and taking off regular upper body clothing?: A Little Help from another person to put on and taking off regular lower body clothing?: A Little 6 Click Score: 19   End of Session Equipment Utilized During Treatment: Gait belt;Rolling walker (2 wheels) Nurse Communication: Mobility status  Activity Tolerance: Patient tolerated treatment well Patient left: in chair;with call bell/phone within reach;with chair alarm set  OT Visit Diagnosis: Unsteadiness on feet (R26.81);Other abnormalities of gait and mobility (R26.89);Muscle weakness (generalized) (M62.81)                Time: 8398-8384 OT Time Calculation (min): 14 min Charges:  OT General Charges $OT Visit: 1 Visit OT Evaluation $OT Eval Moderate Complexity: 1 Mod  Stephanie Mcglone C, OT  Acute Rehabilitation Services Office [phone] Secure chat preferred   Belia Febo  S Maizee Reinhold 05/05/2024, 4:30 PM
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
## 2024-05-05 NOTE — Telephone Encounter (Signed)
 Spoke to patient and to Dr. Charol Cummins told patient we could send in a small rx for dilaudid  so I just sent this in to pharmacy

## 2024-05-05 NOTE — Anesthesia Procedure Notes (Signed)
 Anesthesia Regional Block: Adductor canal block   Pre-Anesthetic Checklist: , timeout performed,  Correct Patient, Correct Site, Correct Laterality,  Correct Procedure, Correct Position, site marked,  Risks and benefits discussed,  Surgical consent,  Pre-op evaluation,  At surgeon's request and post-op pain management  Laterality: Lower  Prep: chloraprep       Needles:  Injection technique: Single-shot  Needle Type: Stimiplex     Needle Length: 9cm  Needle Gauge: 21     Additional Needles:   Procedures:,,,, ultrasound used (permanent image in chart),,    Narrative:  Start time: 05/05/2024 6:40 AM End time: 05/05/2024 7:00 AM Injection made incrementally with aspirations every 5 mL.  Performed by: Personally  Anesthesiologist: Darlyn Rush, MD  Additional Notes: BP cuff, EKG monitors applied. Sedation begun. Artery and nerve location verified with ultrasound. Anesthetic injected incrementally (5ml), slowly, and after negative aspirations under direct u/s guidance. Good fascial/perineural spread. Tolerated well.

## 2024-05-05 NOTE — Transfer of Care (Signed)
 Immediate Anesthesia Transfer of Care Note  Patient: Jacob Adams  Procedure(s) Performed: ARTHROPLASTY, KNEE, TOTAL (Left: Knee)  Patient Location: PACU  Anesthesia Type:Spinal and MAC combined with regional for post-op pain  Level of Consciousness: awake, alert , and oriented  Airway & Oxygen Therapy: Patient Spontanous Breathing  Post-op Assessment: Report given to RN, Post -op Vital signs reviewed and stable, Patient moving all extremities X 4, and Patient able to stick tongue midline  Post vital signs: Reviewed and stable  Last Vitals:  Vitals Value Taken Time  BP 112/71 05/05/24 09:33  Temp 97.6   Pulse 65 05/05/24 09:34  Resp 12 05/05/24 09:34  SpO2 99 % 05/05/24 09:34  Vitals shown include unfiled device data.  Last Pain:  Vitals:   05/05/24 0546  TempSrc: Oral         Complications: No notable events documented.

## 2024-05-05 NOTE — Anesthesia Postprocedure Evaluation (Signed)
 Anesthesia Post Note  Patient: Jacob Adams  Procedure(s) Performed: ARTHROPLASTY, KNEE, TOTAL (Left: Knee)     Patient location during evaluation: PACU Anesthesia Type: Spinal Level of consciousness: awake and alert Pain management: pain level controlled Vital Signs Assessment: post-procedure vital signs reviewed and stable Respiratory status: spontaneous breathing Cardiovascular status: stable Postop Assessment: spinal receding Anesthetic complications: no   No notable events documented.  Last Vitals:  Vitals:   05/05/24 1030 05/05/24 1049  BP: 122/81 127/85  Pulse: 67 66  Resp: 17 20  Temp:  36.5 C  SpO2: 99% 100%    Last Pain:  Vitals:   05/05/24 1030  TempSrc:   PainSc: 0-No pain                 [name]

## 2024-05-05 NOTE — Op Note (Signed)
 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 CPT [id number]  Surgeon: N. Ozell Cummins, MD  Assist: Ronal Morna Grave, PA-C; necessary for the timely completion of procedure and due to complexity of procedure.  Anesthesia: Spinal, regional  Tourniquet time: see anesthesia record  Implants used: Zimmer persona pressfit Femur: CR 10 Tibia: F Patella: 35 mm Polyethylene: 10 mm medial congruent  Indication: Jacob Adams is a 58 y.o. year old male with a history of knee pain. Having failed conservative management, the patient elected to proceed with a total knee arthroplasty.  We have reviewed the risk and benefits of the surgery and they elected to proceed after voicing understanding.  Procedure:  After informed consent was obtained and understanding of the risk were voiced including but not limited to bleeding, infection, damage to surrounding structures including nerves and vessels, blood clots, leg length inequality and the failure to achieve desired results, the operative extremity was marked with verbal confirmation of the patient in the holding area.   The patient was then brought to the operating room and transported to the operating room table in the supine position.  A tourniquet was applied to the operative extremity around the upper thigh. The operative limb was then prepped and draped in the usual sterile fashion and preoperative antibiotics were administered.  A time out was performed prior to the start of surgery confirming the correct extremity, preoperative antibiotic administration, as well as team members, implants and instruments available for the case. Correct surgical site was also confirmed with preoperative  radiographs. The limb was then elevated for exsanguination and the tourniquet was inflated. A midline incision was made and a standard medial parapatellar approach was performed.  The patella was everted which showed complete loss of articular cartilage.  The patella was resected down to 14 mm and sized to a 35 mm.  A cover was placed on the patella for protection from retractors.  We then turned our attention to the femur.  The ACL was sacrificed. Start site was drilled in the femur and the intramedullary distal femoral cutting guide was placed, set at 5 degrees valgus, taking 10 mm of distal resection. The distal cut was made. Osteophytes were then removed.   Next, the proximal tibial cutting guide was placed with appropriate slope, varus/valgus alignment and depth of resection. A drop rod was attached to confirm that it was pointed to the second metatarsal.  The proximal tibial cut was made taking 2 mm off the low side. Gap blocks were then used to assess the extension gap and alignment, and appropriate soft tissue releases were performed. Attention was turned back to the femur, which was sized using the sizing guide to a size 10. Appropriate rotation of the femoral component was determined using epicondylar axis, Whiteside's line, and assessing the flexion gap under ligament tension. The appropriate size 4-in-1 cutting block was placed and cuts were made.  Posterior femoral osteophytes and uncapped bone were then removed with the curved osteotome.  Trial components were placed, and stability was checked in full extension, mid-flexion, and deep flexion.  The PCL was retained.  The patella tracked well without a lateral release. Trial components were then removed and tibial preparation performed.  The tibial trial was pointed to the medial third of the tibial tubercle.  The tibia was sized  for a size F component and prepped.  Trial components were removed.   The bony surfaces were irrigated with a pulse  lavage and then dried. Final components were placed.  The final polyethylene liner, 10 mm thick, was inserted and checked to ensure the locking mechanism had engaged appropriately.  The stability of the construct was re-evaluated throughout a range of motion and found to be acceptable.  The tourniquet was deflated and hemostasis was achieved. The wound was irrigated with normal saline.  One gram of vancomycin  powder was placed in the surgical bed.  Topical mixture of 0.25% bupivacaine  and meloxicam  was placed in the joint for postoperative pain.  Capsular closure was performed with a #1 statafix in flexion, subcutaneous fat closed with a 0 vicryl suture, then subcutaneous tissue closed with interrupted 2.0 vicryl suture. The skin was then closed with a 2.0 nylon and dermabond. A sterile dressing was applied.  The patient was awakened in the operating room and taken to recovery in stable condition. All sponge, needle, and instrument counts were correct at the end of the case.  Morna Grave was necessary for opening, closing, retracting, limb positioning and overall facilitation and completion of the surgery.  Position: supine  Complications: none.  Time Out: performed  Drains/Packing: none Estimated blood loss: minimal Returned to Recovery Room: in good condition.   Mechanical VTE (DVT) Prophylaxis: sequential compression devices, TED thigh-high  Chemical VTE (DVT) Prophylaxis: eliquis  POD 1  Fluid Replacement  Crystalloid: see anesthesia record Blood: none  FFP: none   Specimens Removed: 1 to pathology  Sponge and Instrument Count Correct? yes  PACU: portable radiograph - knee AP and Lateral  Plan/RTC: Return in 2 weeks for suture removal.  Weight Bearing/Load Lower Extremity: full   Implant Name Type Inv. Item Serial No. Manufacturer Lot No. LRB No. Used Action  COMPONENT FEM PS KN STD 10 LT - [id number] Knees COMPONENT FEM PS KN STD 10 LT  ZIMMER RECON(ORTH,TRAU,BIO,SG) [id number] Left  1 Implanted  STEM TIBIAL 10 8-11 EF POLY LT - [id number] Joint STEM TIBIAL 10 8-11 EF POLY LT  ZIMMER [employer]ON(ORTH,TRAU,BIO,SG) [id number] Left 1 Implanted  COMPONENT PATELLA 3 PEG 35 - [id number] Joint COMPONENT PATELLA 3 PEG 35  ZIMMER [employer]ON(ORTH,TRAU,BIO,SG) [id number] Left 1 Implanted  COMPONENT TIB KNEE PS 0D LT - [id number] Joint COMPONENT TIB KNEE PS 0D LT  ZIMMER [employer]ON(ORTH,TRAU,BIO,SG) [id number] Left 1 Implanted    N. Ozell Cummins, MD [facility] 8:58 AM

## 2024-05-05 NOTE — Discharge Instructions (Signed)

## 2024-05-06 ENCOUNTER — Encounter (HOSPITAL_COMMUNITY): Payer: Self-pay | Admitting: Orthopaedic Surgery

## 2024-05-06 ENCOUNTER — Other Ambulatory Visit (HOSPITAL_COMMUNITY): Payer: Self-pay

## 2024-05-06 ENCOUNTER — Encounter: Payer: Self-pay | Admitting: Advanced Practice Midwife

## 2024-05-06 DIAGNOSIS — M1712 Unilateral primary osteoarthritis, left knee: Secondary | ICD-10-CM | POA: Diagnosis not present

## 2024-05-06 LAB — GLUCOSE, CAPILLARY: Glucose-Capillary: 103 mg/dL — ABNORMAL HIGH (ref 70–99)

## 2024-05-06 NOTE — Discharge Summary (Signed)
 Patient ID: Jacob Adams MRN: [id number] DOB/AGE: 06/02/1966 58 y.o.  Admit date: 05/05/2024 Discharge date: 05/06/2024  Admission Diagnoses:  Primary osteoarthritis of left knee  Discharge Diagnoses:  Principal Problem:   Primary osteoarthritis of left knee Active Problems:   Status post total left knee replacement   Past Medical History:  Diagnosis Date   Acid reflux    Alcohol withdrawal (HCC) 01/2019   Anxiety    Arthritis    toes (07/26/2014)   CHF (congestive heart failure) (HCC)    Depression    Diabetes mellitus without complication (HCC)    Headache(784.0)    weekly (07/26/2014)   History of blood transfusion ~ 2000   related to nose bleeding   History of stomach ulcers    Hypertension    Lower GI bleeding admitted 07/26/2014   Mental disorder    Migraine    @ least monthly (07/26/2014)   Pancreatitis    Rectal bleeding 07/26/2014   Sleep apnea    haven't been RX'd mask yet (07/26/2014)    Surgeries: Procedure(s): ARTHROPLASTY, KNEE, TOTAL on 05/05/2024   Consultants (if any):   Discharged Condition: Improved  Hospital Course: Jacob Adams is an 58 y.o. male who was admitted 05/05/2024 with a diagnosis of Primary osteoarthritis of left knee and went to the operating room on 05/05/2024 and underwent the above named procedures.    He was given perioperative antibiotics:  Anti-infectives (From admission, onward)    Start     Dose/Rate Route Frequency Ordered Stop   05/05/24 1330  ceFAZolin  (ANCEF ) IVPB 2g/100 mL premix        2 g 200 mL/hr over 30 Minutes Intravenous Every 6 hours 05/05/24 1032 05/05/24 1834   05/05/24 0812  vancomycin  (VANCOCIN ) powder  Status:  Discontinued          As needed 05/05/24 0812 05/05/24 0929   05/05/24 0600  ceFAZolin  (ANCEF ) IVPB 2g/100 mL premix        2 g 200 mL/hr over 30 Minutes Intravenous On call to O.R. 05/05/24 9449 05/05/24 9191     .  He was given sequential compression devices, early ambulation,  and appropriate chemoprophylaxis for DVT prophylaxis.  He benefited maximally from the hospital stay and there were no complications.    Recent vital signs:  Vitals:   05/06/24 0509 05/06/24 0743  BP: (!) 123/91 124/88  Pulse: 69 72  Resp: 17 20  Temp: 98.6 F (37 C) 98.1 F (36.7 C)  SpO2: 100% 99%    Recent laboratory studies:  Lab Results  Component Value Date   HGB 13.0 04/26/2024   HGB 13.0 03/15/2024   HGB 10.8 (L) 03/13/2024   Lab Results  Component Value Date   WBC 8.5 04/26/2024   PLT 429 (H) 04/26/2024   Lab Results  Component Value Date   INR 1.0 01/15/2024   Lab Results  Component Value Date   NA 135 04/26/2024   K 4.9 04/26/2024   CL 101 04/26/2024   CO2 20 (L) 04/26/2024   BUN 15 04/26/2024   CREATININE 1.38 (H) 04/26/2024   GLUCOSE 98 04/26/2024    Discharge Medications:   Allergies as of 05/06/2024       Reactions   Nsaids Swelling, Rash, Other (See Comments)   Stomach pain   Motrin  [ibuprofen ] Nausea Only, Swelling, Other (See Comments)   Stomach pain   Sunscreens Swelling, Other (See Comments)   Skin peels   Penicillins Itching, Swelling, Rash  Tolerated cefazolin  05/05/24   [name] Other (See Comments)   Does not eat pork or pork by-products, personal preference        Medication List     PAUSE taking these medications    Entresto  49-51 MG Wait to take this until your doctor or other care provider tells you to start again. Generic drug: sacubitril -valsartan  Take 1 tablet by mouth 2 (two) times daily.   Farxiga  10 MG Tabs tablet Wait to take this until your doctor or other care provider tells you to start again. Generic drug: dapagliflozin  propanediol Take 10 mg by mouth in the morning.   spironolactone  25 MG tablet Wait to take this until your doctor or other care provider tells you to start again. Commonly known as: ALDACTONE  Take 1 tablet (25 mg total) by mouth daily. What changed: when to take this        TAKE these medications    albuterol  108 (90 Base) MCG/ACT inhaler Commonly known as: VENTOLIN  HFA Inhale 2 puffs into the lungs every 6 (six) hours as needed for wheezing or shortness of breath.   atorvastatin  80 MG tablet Commonly known as: LIPITOR Take 1 tablet (80 mg total) by mouth at bedtime. What changed: when to take this   butalbital -acetaminophen -caffeine  50-325-40 MG tablet Commonly known as: FIORICET  Take 1 tablet by mouth 2 (two) times daily as needed for headache or migraine.   CALTRATE 600+D3 PO Take 1 tablet by mouth in the morning.   carvedilol  6.25 MG tablet Commonly known as: COREG  Take 1 tablet (6.25 mg total) by mouth 2 (two) times daily.   CertaVite/Antioxidants Tabs Take 1 tablet by mouth daily. What changed: when to take this   docusate sodium  100 MG capsule Commonly known as: Colace Take 1 capsule (100 mg total) by mouth daily as needed. What changed: reasons to take this   DULoxetine  30 MG capsule Commonly known as: CYMBALTA  Take 1 capsule (30mg ) by mouth twice daily. What changed:  how much to take how to take this when to take this additional instructions   Eliquis  2.5 MG Tabs tablet Generic drug: apixaban  Take 1 tablet by mouth twice daily for 30 days after surgery to prevent blood clots   Emgality  120 MG/ML Soaj Generic drug: Galcanezumab -gnlm Inject 120 mg into the skin See admin instructions. Inject 120mg  subcutaneously once a month, between the 1st and 3rd.   folic acid  1 MG tablet Commonly known as: FOLVITE  Take 1 tablet (1 mg total) by mouth daily. What changed: when to take this   HYDROmorphone  2 MG tablet Commonly known as: Dilaudid  Take 1-2 tablets (2-4 mg total) by mouth every 6 (six) hours as needed for up to 5 days for severe pain (pain score 7-10).   hyoscyamine  0.125 MG SL tablet Commonly known as: Levsin /SL Place 1 tablet (0.125 mg total) under the tongue every 4 (four) hours as needed.   latanoprost   0.005 % ophthalmic solution Commonly known as: XALATAN  Place 1 drop into both eyes at bedtime.   levothyroxine  88 MCG tablet Commonly known as: SYNTHROID  Take 88 mcg by mouth daily before breakfast.   metFORMIN 500 MG tablet Commonly known as: GLUCOPHAGE Take 500 mg by mouth in the morning.   methocarbamol  750 MG tablet Commonly known as: Robaxin -750 Take 1 tablet (750 mg total) by mouth 3 (three) times daily as needed for muscle spasms.   metoprolol  succinate 50 MG 24 hr tablet Commonly known as: TOPROL -XL Take 50 mg by mouth daily. Take  with or immediately following a meal.   naltrexone  50 MG tablet Commonly known as: DEPADE Take 50 mg by mouth in the morning.   ondansetron  4 MG tablet Commonly known as: Zofran  Take 1 tablet (4 mg total) by mouth every 8 (eight) hours as needed for nausea or vomiting.   pantoprazole  40 MG tablet Commonly known as: Protonix  Take 1 tablet (40 mg total) by mouth daily. What changed:  when to take this reasons to take this   polyethylene glycol powder 17 GM/SCOOP powder Commonly known as: GLYCOLAX /MIRALAX  Take 17 g by mouth daily as needed for mild constipation.   QUEtiapine  200 MG tablet Commonly known as: SEROQUEL  Take 200 mg by mouth at bedtime.   sucralfate  1 g tablet Commonly known as: Carafate  Take 1 tablet (1 g total) by mouth 4 (four) times daily -  with meals and at bedtime.   thiamine  100 MG tablet Commonly known as: VITAMIN B1 Take 1 tablet (100 mg total) by mouth daily. What changed: when to take this   topiramate  50 MG tablet Commonly known as: TOPAMAX  Take 50 mg by mouth 2 (two) times daily.   Ubrelvy  50 MG Tabs Generic drug: Ubrogepant  Take 50 mg by mouth daily as needed (for migraines).               Durable Medical Equipment  (From admission, onward)           Start     Ordered   05/05/24 1033  DME Walker rolling  Once       Question Answer Comment  Walker: With 5 Inch Wheels   Patient  needs a walker to treat with the following condition Status post left partial knee replacement      05/05/24 1032   05/05/24 1033  DME 3 n 1  Once        05/05/24 1032   05/05/24 1033  DME Bedside commode  Once       Question:  Patient needs a bedside commode to treat with the following condition  Answer:  Status post left partial knee replacement   05/05/24 1032            Diagnostic Studies: DG Knee Left Port Result Date: 05/05/2024 CLINICAL DATA:  Postoperative left knee pain. EXAM: PORTABLE LEFT KNEE - 1-2 VIEW COMPARISON:  March 25, 2024. FINDINGS: Status post left total knee arthroplasty. The femoral, tibial and patellar components are well situated. Expected postoperative changes seen in the soft tissues anteriorly. IMPRESSION: Status post left total knee arthroplasty. Electronically Signed   By: Lynwood Landy Raddle M.D.   On: 05/05/2024 13:37    Disposition: Discharge disposition: 01-Home or Self Care       Discharge Instructions     Call MD / Call 911   Complete by: As directed    If you experience chest pain or shortness of breath, CALL 911 and be transported to the hospital emergency room.  If you develope a fever above 101.5 F, pus (white drainage) or increased drainage or redness at the wound, or calf pain, call your surgeon's office.   Constipation Prevention   Complete by: As directed    Drink plenty of fluids.  Prune juice may be helpful.  You may use a stool softener, such as Colace (over the counter) 100 mg twice a day.  Use MiraLax  (over the counter) for constipation as needed.   Driving restrictions   Complete by: As directed    No driving while  taking narcotic pain meds.   Increase activity slowly as tolerated   Complete by: As directed    Post-operative opioid taper instructions:   Complete by: As directed    POST-OPERATIVE OPIOID TAPER INSTRUCTIONS: It is important to wean off of your opioid medication as soon as possible. If you do not need pain medication  after your surgery it is ok to stop day one. Opioids include: Codeine, Hydrocodone (Norco, Vicodin), Oxycodone (Percocet, oxycontin ) and hydromorphone  amongst others.  Long term and even short term use of opiods can cause: Increased pain response Dependence Constipation Depression Respiratory depression And more.  Withdrawal symptoms can include Flu like symptoms Nausea, vomiting And more Techniques to manage these symptoms Hydrate well Eat regular healthy meals Stay active Use relaxation techniques(deep breathing, meditating, yoga) Do Not substitute Alcohol to help with tapering If you have been on opioids for less than two weeks and do not have pain than it is ok to stop all together.  Plan to wean off of opioids This plan should start within one week post op of your joint replacement. Maintain the same interval or time between taking each dose and first decrease the dose.  Cut the total daily intake of opioids by one tablet each day Next start to increase the time between doses. The last dose that should be eliminated is the evening dose.           Follow-up Information     Jule Ronal CROME, PA-C. Schedule an appointment as soon as possible for a visit in 2 week(s).   Specialty: Orthopedic Surgery Contact information: [address] [place]  [place] [address] [phone]                  Signed: Ozell Cummins 05/06/2024, 7:46 AM

## 2024-05-06 NOTE — Progress Notes (Signed)
 Patient alert and oriented, ambulate, void. Surgical site clean and dry no sign of infection. D/c instructions explain and given.  Patient d/c home with  3 in 1 and RW per order.

## 2024-05-06 NOTE — Progress Notes (Signed)
 Occupational Therapy Treatment Patient Details Name: Jacob Adams MRN: [id number] DOB: [birth year] Today's Date: 05/06/2024   History of present illness Pt is a 58 y/o male presenting 7/17 for L knee OA, s/p L TKA.  PMHx:DM, HTN, sleep apnea, CHF   OT comments  Pt progressing well towards goals. Session focused on improving mobility and LB dressing. Progressed LB dressing to CGA, with cues for compensatory technique and sequencing. Toilet transfer improved to ambulation to regular toilet with use of GB. Reviewed use of DME for home use, no follow up OT needs. Future OT sessions will continue to improve standing ADL tasks and balance.       If plan is discharge home, recommend the following:  A little help with walking and/or transfers;A little help with bathing/dressing/bathroom;Assistance with cooking/housework   Equipment Recommendations  Tub/shower seat;BSC/3in1    Recommendations for Other Services      Precautions / Restrictions Precautions Precautions: Knee Precaution Booklet Issued: Yes (comment) Recall of Precautions/Restrictions: Intact Restrictions Weight Bearing Restrictions Per Provider Order: Yes LLE Weight Bearing Per Provider Order: Weight bearing as tolerated       Mobility Bed Mobility Overal bed mobility: Modified Independent     General bed mobility comments: Increased time    Transfers Overall transfer level: Needs assistance Equipment used: Rolling walker (2 wheels) Transfers: Sit to/from Stand, Bed to chair/wheelchair/BSC Sit to Stand: Contact guard assist, From elevated surface     Step pivot transfers: Contact guard assist     General transfer comment: Cues for sequencing with RW, CGA for balance     Balance Overall balance assessment: Needs assistance Sitting-balance support: No upper extremity supported, Feet supported Sitting balance-Leahy Scale: Good     Standing balance support: Bilateral upper extremity supported, During functional  activity, Reliant on assistive device for balance Standing balance-Leahy Scale: Poor Standing balance comment: Reliant on RW     ADL either performed or assessed with clinical judgement   ADL Overall ADL's : Needs assistance/impaired     Grooming: Set up;Sitting     Lower Body Dressing: Contact guard assist;Cueing for safety;Cueing for compensatory techniques;Sit to/from stand Lower Body Dressing Details (indicate cue type and reason): Cueing for compensatory techniques and maintaining single UE support Toilet Transfer: Contact guard assist;Ambulation;Rolling walker (2 wheels);Regular Toilet;Grab bars Toilet Transfer Details (indicate cue type and reason): Cueing for sequencing, use of GB to stand Toileting- Clothing Manipulation and Hygiene: Contact guard assist;Sit to/from stand       Functional mobility during ADLs: Contact guard assist;Rolling walker (2 wheels) General ADL Comments: Improved ability to bear wt through LLE    Extremity/Trunk Assessment Upper Extremity Assessment Upper Extremity Assessment: Overall WFL for tasks assessed   Lower Extremity Assessment Lower Extremity Assessment: Defer to PT evaluation        Vision   Vision Assessment?: No apparent visual deficits         Communication Communication Communication: No apparent difficulties   Cognition Arousal: Alert Behavior During Therapy: WFL for tasks assessed/performed Cognition: No apparent impairments       Following commands: Intact        Cueing   Cueing Techniques: Verbal cues             Pertinent Vitals/ Pain       Pain Assessment Pain Assessment: Faces Faces Pain Scale: Hurts little more Pain Location: L knee Pain Descriptors / Indicators: Discomfort, Guarding Pain Intervention(s): Monitored during session   Frequency  Min 2X/week  Progress Toward Goals  OT Goals(current goals can now be found in the care plan section)  Progress towards OT goals:  Progressing toward goals  Acute Rehab OT Goals Patient Stated Goal: To get better OT Goal Formulation: With patient Time For Goal Achievement: 05/19/24 Potential to Achieve Goals: Good ADL Goals Pt Will Perform Grooming: with supervision;standing Pt Will Perform Lower Body Dressing: with supervision;sit to/from stand Pt Will Transfer to Toilet: with supervision;ambulating;regular height toilet Pt Will Perform Toileting - Clothing Manipulation and hygiene: with supervision;sit to/from stand Pt Will Perform Tub/Shower Transfer: Shower transfer;with supervision;shower seat;ambulating  Plan         AM-PAC OT 6 Clicks Daily Activity     Outcome Measure   Help from another person eating meals?: None Help from another person taking care of personal grooming?: A Little Help from another person toileting, which includes using toliet, bedpan, or urinal?: A Little Help from another person bathing (including washing, rinsing, drying)?: A Little Help from another person to put on and taking off regular upper body clothing?: A Little Help from another person to put on and taking off regular lower body clothing?: A Little 6 Click Score: 19    End of Session Equipment Utilized During Treatment: Gait belt;Rolling walker (2 wheels)  OT Visit Diagnosis: Unsteadiness on feet (R26.81);Other abnormalities of gait and mobility (R26.89);Muscle weakness (generalized) (M62.81)   Activity Tolerance Patient tolerated treatment well   Patient Left in chair;with call bell/phone within reach   Nurse Communication Mobility status        Time: 9275-9261 OT Time Calculation (min): 14 min  Charges: OT General Charges $OT Visit: 1 Visit OT Treatments $Self Care/Home Management : 8-22 mins  Jacob Adams, OT  Acute Rehabilitation Services Office [phone] Secure chat preferred   Jacob Adams Savers 05/06/2024, 7:56 AM

## 2024-05-06 NOTE — TOC Initial Note (Signed)
 Transition of Care [facility]) - Initial/Assessment Note    Patient Details  Name: Jacob Adams MRN: [id number] Date of Birth: 11/28/1965  Transition of Care [facility]) CM/SW Contact:    Nola Devere Hands, RN Phone Number: 05/06/2024, 9:09 AM  Clinical Narrative:                 Case manager spoke with patient concerning discharge needs. Patient is s/p left total knee arthroplasty. Has RW, needs 3in1, Referral for outpatient therapy sent to [facility], no one accepts patient's insurance for Home Health.   Expected Discharge Plan: OP Rehab Barriers to Discharge: No Barriers Identified   Patient Goals and CMS Choice            Expected Discharge Plan and Services   Discharge Planning Services: CM Consult Post Acute Care Choice: Durable Medical Equipment   Expected Discharge Date: 05/06/24               DME Arranged: 3-N-1 DME Agency: [employer] Date DME Agency Contacted: 05/06/24 Time DME Agency Contacted: 0900   HH Arranged: NA HH Agency: NA        Prior Living Arrangements/Services   Lives with:: Significant Other Patient language and need for interpreter reviewed:: Yes Do you feel safe going back to the place where you live?: Yes      Need for Family Participation in Patient Care: Yes (Comment) Care giver support system in place?: Yes (comment)   Criminal Activity/Legal Involvement Pertinent to Current Situation/Hospitalization: No - Comment as needed  Activities of Daily Living      Permission Sought/Granted                  Emotional Assessment   Attitude/Demeanor/Rapport: Gracious     Alcohol / Substance Use: Not Applicable Psych Involvement: No (comment)  Admission diagnosis:  Primary osteoarthritis of left knee [M17.12] Status post total left knee replacement [Z96.652] Patient Active Problem List   Diagnosis Date Noted   Status post total left knee replacement 05/05/2024   Primary osteoarthritis of left knee 05/04/2024   Syncope  03/10/2024   Diarrhea 03/10/2024   Hypotension 01/17/2024   Chronic health problem 01/15/2024   AKI (acute kidney injury) (HCC) 01/15/2024   History of seizure due to alcohol withdrawal 01/15/2024   Abdominal pain 01/15/2024   Major depressive disorder, 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   Major depressive disorder, recurrent, severe without psychotic behavior (HCC) 01/02/2024   Low back pain 12/31/2023   Lactic acid acidosis 12/31/2023   Low glucose level 12/31/2023   Suicidal behavior 12/31/2023   Chronic colitis 04/25/2022   Alcoholic cirrhosis of liver without ascites (HCC) 04/25/2022   Portal hypertensive gastropathy (HCC) 04/25/2022   History of anemia 04/25/2022   Hyponatremia 01/23/2022   High anion gap metabolic acidosis 01/23/2022   Hyperkalemia 01/23/2022   Fatty liver 01/23/2022   Insomnia 10/04/2020   AMS (altered mental status) 07/23/2020   Sleep apnea    Hypertension    Acute metabolic encephalopathy 01/24/2020   Normocytic anemia 08/15/2019   Hypothyroidism 08/15/2019   HLD (hyperlipidemia) 08/15/2019   Anxiety and depression 08/15/2019   Chronic systolic CHF (congestive heart failure) (HCC) 02/08/2019   Tobacco dependence 02/08/2019   Alcohol withdrawal (HCC) 01/2019   HTN (hypertension) 12/24/2018   Alcohol abuse 01/06/2016   MDD (major depressive disorder), recurrent, severe, 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/03/2015   Alcohol use disorder, severe, dependence (HCC) 11/03/2015   History of GI bleed 10/31/2015  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 [place] [address] Phone:  [phone] Fax: [phone]     Social Drivers of Health (SDOH) Social History: SDOH Screenings   Food Insecurity: No Food Insecurity (03/10/2024)  Recent Concern: Food Insecurity - Food Insecurity Present (01/15/2024)  Housing: Low Risk  (03/10/2024)  Transportation Needs: No Transportation Needs (03/10/2024)  Recent Concern: Transportation Needs - Unmet Transportation Needs (12/31/2023)  Utilities: Not At Risk (03/10/2024)  Alcohol Screen: Low Risk  (01/04/2024)  Depression (PHQ2-9): Medium Risk (06/29/2020)  Financial Resource Strain: Not on File (10/03/2022)   Received from [facility]  Physical Activity: Not on File (10/03/2022)   Received from [facility]  Social Connections: Moderately Isolated (03/10/2024)  Stress: Not on File (10/03/2022)   Received from [facility]  Tobacco Use: High Risk (05/05/2024)   SDOH Interventions:     Readmission Risk Interventions    01/18/2024   12:53 PM 01/01/2024    2:34 PM  Readmission Risk Prevention Plan  Post Dischage Appt  Complete  Medication Screening  Complete  Transportation Screening Complete Complete  PCP or Specialist Appt within 3-5 Days Complete   HRI or Home Care Consult Complete   Social Work Consult for Recovery Care Planning/Counseling Complete   Palliative Care Screening Not Applicable   Medication Review Oceanographer) Complete

## 2024-05-06 NOTE — Progress Notes (Signed)
   Subjective:  Patient reports pain as mild.     Objective:   VITALS:   Vitals:   05/05/24 2332 05/05/24 2332 05/06/24 0509 05/06/24 0743  BP: 107/72 107/72 (!) 123/91 124/88  Pulse: 69 71 69 72  Resp: 19 19 17 20   Temp: 98.2 F (36.8 C) 98.2 F (36.8 C) 98.6 F (37 C) 98.1 F (36.7 C)  TempSrc: Oral Oral Oral Oral  SpO2: 100% 100% 100% 99%  Weight:      Height:        Sensation intact distally Intact pulses distally Dorsiflexion/Plantar flexion intact Incision: dressing C/D/I and no drainage   Lab Results  Component Value Date   WBC 8.5 04/26/2024   HGB 13.0 04/26/2024   HCT 38.6 (L) 04/26/2024   MCV 96.0 04/26/2024   PLT 429 (H) 04/26/2024     Assessment/Plan:  1 Day Post-Op   - Expected postop acute blood loss anemia - Up with PT/OT - DVT ppx - SCDs, ambulation, eliquis  POD 1  - WBAT operative extremity - Pain control - Discharge planning - anticipate d/c home today after PT/OT  Jacob Adams 05/06/2024, 7:46 AM

## 2024-05-06 NOTE — Progress Notes (Signed)
 Physical Therapy Treatment Patient Details Name: Jacob Adams MRN: [id number] DOB: September 30, 1966 Today's Date: 05/06/2024   History of Present Illness Pt is a 58 y/o male presenting 7/17 for L knee OA, s/p L TKA.  PMHx:DM, HTN, sleep apnea, CHF    PT Comments  Pt tolerated session well, requiring less physical assistance and demonstrating improved L knee stability than in previous session. Pt was able to progress to reciprocal gait pattern w/ AD, and negotiated step as per home setup w/ AD and CGA. Education provided on the importance of frequent mobilization and modalities, positioning while sitting and in supine, as well as on how to perform car transfer; pt verbalized understanding. At the end of session, pt given stair negotiation handout. PT will continue to treat pt while he is admitted. Recommending HHPT at discharge to address remaining mobility deficits and optimize return to PLOF.     If plan is discharge home, recommend the following: A little help with walking and/or transfers;Assistance with cooking/housework;Assist for transportation;Help with stairs or ramp for entrance   Can travel by private vehicle        Equipment Recommendations  Rolling walker (2 wheels);BSC/3in1    Recommendations for Other Services       Precautions / Restrictions Precautions Precautions: Knee Precaution Booklet Issued: Yes (comment) Recall of Precautions/Restrictions: Intact Restrictions Weight Bearing Restrictions Per Provider Order: Yes LLE Weight Bearing Per Provider Order: Weight bearing as tolerated     Mobility  Bed Mobility Overal bed mobility: Modified Independent Bed Mobility: Supine to Sit, Sit to Supine     Supine to sit: Modified independent (Device/Increase time) Sit to supine: Modified independent (Device/Increase time)   General bed mobility comments: increased time to complete    Transfers Overall transfer level: Needs assistance Equipment used: Rolling walker (2  wheels) Transfers: Sit to/from Stand Sit to Stand: Supervision           General transfer comment: STS from recliner w/ RW. Pt uses BUE to push-up from surface and transfers them one at a time to RW. Increased time to complete.    Ambulation/Gait Ambulation/Gait assistance: Contact guard assist, Supervision Gait Distance (Feet): 125 Feet Assistive device: Rolling walker (2 wheels) Gait Pattern/deviations: Step-to pattern, Step-through pattern, Decreased step length - left, Decreased stride length Gait velocity: reduced Gait velocity interpretation: <1.8 ft/sec, indicate of risk for recurrent falls   General Gait Details: Pt progressed from step-to gait pattern to reciprocal gait pattern and no signs fo L knee instability. Pt ambulates w/ increased reliance on RW and was given VC to relax shoulders throughout ambulation.   Stairs Stairs: Yes Stairs assistance: Contact guard assist Stair Management: No rails, Step to pattern, Backwards, With walker Number of Stairs: 1 General stair comments: Pt negotiated step x2 trials backwards w/ RW and VC for step by step sequencing. Pt demonstrating good control w/ decent, but required frequent reminder to ascend w/ non-surgical and descend w/ surgical leg; handout given at end of session.   Wheelchair Mobility     Tilt Bed    Modified Rankin (Stroke Patients Only)       Balance Overall balance assessment: Needs assistance Sitting-balance support: No upper extremity supported, Feet supported Sitting balance-Leahy Scale: Good Sitting balance - Comments: seated in recliner   Standing balance support: Bilateral upper extremity supported, During functional activity, Reliant on assistive device for balance Standing balance-Leahy Scale: Poor Standing balance comment: reliant on external support  Communication Communication Communication: No apparent difficulties  Cognition Arousal:  Alert Behavior During Therapy: WFL for tasks assessed/performed   PT - Cognitive impairments: No apparent impairments                         Following commands: Intact      Cueing Cueing Techniques: Verbal cues, Visual cues  Exercises Total Joint Exercises Knee Flexion: AROM, Left, 5 reps Goniometric ROM: flx: 98, ext: -4 General Exercises - Lower Extremity Long Arc Quad: AROM, Left, 5 reps    General Comments General comments (skin integrity, edema, etc.): no signs of acute distress      Pertinent Vitals/Pain Pain Assessment Pain Assessment: Faces Faces Pain Scale: Hurts a little bit Pain Location: L knee Pain Descriptors / Indicators: Discomfort, Grimacing Pain Intervention(s): Limited activity within patient's tolerance, Monitored during session    Home Living                          Prior Function            PT Goals (current goals can now be found in the care plan section) Acute Rehab PT Goals Patient Stated Goal: Independence PT Goal Formulation: With patient Time For Goal Achievement: 05/12/24 Potential to Achieve Goals: Good Progress towards PT goals: Progressing toward goals    Frequency    7X/week      PT Plan      Co-evaluation              AM-PAC PT 6 Clicks Mobility   Outcome Measure  Help needed turning from your back to your side while in a flat bed without using bedrails?: None Help needed moving from lying on your back to sitting on the side of a flat bed without using bedrails?: None Help needed moving to and from a bed to a chair (including a wheelchair)?: A Little Help needed standing up from a chair using your arms (e.g., wheelchair or bedside chair)?: A Little Help needed to walk in hospital room?: A Little Help needed climbing 3-5 steps with a railing? : A Little 6 Click Score: 20    End of Session Equipment Utilized During Treatment: Gait belt Activity Tolerance: Patient tolerated treatment  well Patient left: in chair;with call bell/phone within reach Nurse Communication: Mobility status PT Visit Diagnosis: Other abnormalities of gait and mobility (R26.89);Muscle weakness (generalized) (M62.81);Pain Pain - Right/Left: Left Pain - part of body: Knee     Time: 9143-9077 PT Time Calculation (min) (ACUTE ONLY): 26 min  Charges:    $Gait Training: 23-37 mins                       [employer], SPT Acute Rehab [phone]    Leontine Hilt 05/06/2024, 9:54 AM

## 2024-05-09 NOTE — Progress Notes (Signed)
 Subjective  HPI Jacob Adams is a 58 year old male who presents today for  Chief Complaint  Patient presents with  . Complete Physical Exam    Would like to discuss medication change. Last colonoscopy was a few months ago.     Just had arthroplasty of left knee on 7/17. He is getting around. He is present for a routine exam. He has hiccups today. He is followed at [facility] hospital for mental health concerns. He has a complex past history including PTSD, MDD, hx of SUD, OSA, HBP, CHF, alcoholic cirrhosis, obesity, migraines, neuropathy, ulcerative colitis with GI bleeding in the past.  Patient Active Problem List   Diagnosis Date Noted  . Controlled type 2 diabetes mellitus without complication, without long-term current use of insulin  (CMS & HHS-HCC) 04/16/2023  . Positive depression screening 04/16/2023  . Morbid obesity with body mass index (BMI) of 40.0 or higher (CMS & HHS-HCC) 04/16/2023  . Hallucinations, unspecified 04/10/2023  . Headache disorder 04/10/2023  . Leg pain, bilateral 03/09/2023  . Unspecified tear of unspecified meniscus, current injury, unspecified knee, subsequent encounter 03/09/2023  . Vitamin D  deficiency 01/05/2023  . Obstructive sleep apnea of adult 01/05/2023    Feb 06, 2020 Entered By: DULCY CLAPPER A Comment: CPAP auto 7-15 cwp   AHi 8-22   [place] FFM medium   . Chronic post-traumatic stress disorder 01/05/2023  . Acquired buried penis 01/05/2023  . Anxiety disorder, unspecified 01/05/2023  . Complete loss of teeth, unspecified cause, unspecified class 01/05/2023  . Heart failure, unspecified (CMS & HHS-HCC) 01/05/2023    Feb 09, 2020 Entered By: MARGAREE RICARD HERO Comment: 12/26/19;  HFrEF = ECHO EF 25%, diffuse hypokinesis, Alcoholic cardiomyopathyJun 27, 2021 Entered By: ORLIN RANKS Comment: Echo 03/29/2020: Normal LVEFJun 27, 2021 Entered By: ORLIN RANKS Comment: 03/29/20: Zio patch, VPBs &lt; 1%, no sig arrhythmia   . Bipolar disorder (CMS & HHS-HCC) 10/24/2022   . Cocaine dependence in remission (CMS & HHS-HCC) 10/24/2022  . Intractable chronic migraine without aura and with status migrainosus 10/24/2022  . Gastro-esophageal reflux disease without esophagitis 10/24/2022  . Ulcerative colitis with rectal bleeding (CMS & HHS-HCC) 04/25/2022    Last Assessment & Plan:   Formatting of this note might be different from the original.  -Continue home Lialda  Feb 09, 2020 Entered By: MARGAREE RICARD HERO Comment: 08/18/2019- colonoscopy- Dr. Aloha Finner   . Alcoholic cirrhosis of liver without ascites (CMS & HHS-HCC) 04/25/2022  . Hyponatremia 01/23/2022    Last Assessment & Plan:   Formatting of this note might be different from the original.  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  He is also on losartan  and Aldactone . Improving.  -Continue to hold home losartan  and Aldactone   -outpatient follow up   . Insomnia 10/04/2020  . AMS (altered mental status) 07/23/2020  . Acute metabolic encephalopathy 01/24/2020    Last Assessment & Plan:   Formatting of this note might be different from the original.  Possibly from hyponatremia, alcohol and/or Ativan . VBG obtained with low pCO2. Appears to have resolved.   . Alcohol use disorder, severe, dependence (CMS & HHS-HCC) 12/24/2019  . HLD (hyperlipidemia) 08/15/2019  . Hypothyroidism, unspecified 08/15/2019    Last Assessment & Plan:   Formatting of this note might be different from the original.  -Continue home Synthroid    . Bipolar disorder, current episode depressed, severe, with psychotic features (CMS & HHS-HCC) 08/15/2019    Last Assessment & Plan:  Formatting of this note might be different from the original.  -Continue  home Abilify    . Chronic systolic congestive heart failure (CMS & HHS-HCC) 02/08/2019    Last Assessment & Plan:   Formatting of this note might be different from the original.  TTE in 12/2018 with LVEF of 25%, diffuse hypokinesis with some regional variation and  indeterminate DD.  On Lasix , Coreg , losartan  and spironolactone  as an outpatient; diuretics held secondary to hyponatremia and AKI. Losartan  held secondary to AKI.  -Continue Coreg   -Hold diuretics and losartan  for now   . Alcohol use, unspecified, in remission 02/08/2019    Last Assessment & Plan:  Formatting of this note might be different from the original.  Counseled on admission. Nicotine  patch offered on admission.   . Hypertension 12/24/2018    Last Assessment & Plan:  Formatting of this note might be different from the original.  Currently normotensive off of home medication. Coreg  reduced to 3.125 mg BID on admission  -Continue Coreg    . MDD (major depressive disorder), recurrent, severe, with psychosis (CMS & HHS-HCC) 11/03/2015  . Suicidal thoughts 11/02/2015  . 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 Homicidal ideation 02/22/2010    Feb 22, 2010 Entered By: MACDONALD STARCHER T Comment: Crisis Telephone Call Expressing Homicial Ideation no Plan     Past Medical History:  Diagnosis Date  . Allergy few years  . Anemia   . Arthropathy not sure  . Asthma (HHS-HCC) not sure  . Controlled type 2 diabetes mellitus without complication, without long-term current use of insulin  (CMS & HHS-HCC) 04/16/2023  . Convulsions (CMS & HHS-HCC)   . Diaphragmatic hernia without obstruction or gangrene   . Drug abuse (CMS & HHS-HCC) I quit last year  . Essential (primary) hypertension   . Gastroesophageal reflux disease few years  . Generalized anxiety disorder   . Heart failure (CMS & HHS-HCC)   . High cholesterol   . Liver disease few years ago  . Major depression with psychotic features (CMS & HHS-HCC)   . PTSD (post-traumatic stress disorder)   . Schizophrenia (CMS & HHS-HCC)   . Thyroid disease fe years ago    Past Surgical History:  Procedure Laterality Date  . BRAIN SURGERY  not sure  . COLONOSCOPY    . FRACTURE SURGERY  1988  . KNEE SURGERY  03/2023  .  UPPER GI ENDOSCOPY      Tobacco History   Tobacco Use  Smoking Status Some Days  . Types: Cigarettes  . Passive exposure: Never  Smokeless Tobacco Never   Social History   Substance and Sexual Activity  Alcohol Use Yes   Comment: ocasionally   Social History   Substance and Sexual Activity  Drug Use Not Currently  . Types: Cocaine, Crack, Marijuana   Social History   Substance and Sexual Activity  Sexual Activity Not Currently  . Partners: Female  . Birth control/protection: Condom     05/09/2024 10:52 AM  What is your living situation today?  I have a steady place to live  Think about the place you live. Do you have problems with any of the following?  (!) Pests such as bugs, ants, or mice;Mold;Water leaks;Lead paint or pipes  Number of positive responses to housing questions (!) 1  Within the past 12 months, you worried that your food would run out before you got money to buy more. (!) Often true  Within the past 12 months, the food you bought just didn't last and you didn't have money to get more. (!)  Often true  Number of positive responses to food security questions (!) 2  In the past 12 months, has lack of transportation kept you from medical appointments, meetings, work or from getting things needed for daily living? (!) Yes, it has kept me from non-medical meetings, appointments, work, or getting things that I need;Yes, it has kept me from medical appointments or getting medications  In the past 12 months has the electric, gas, oil, or water company threatened to shut off services in your home? No  How often does anyone, including family and friends, physically hurt you? Never  How often does anyone, including family and friends, insult or talk down to you? Never  How often does anyone, including family and friends, threaten you with harm? Never  How often does anyone, including family and friends, scream or curse at you? Never  Relationship Safety Total Score: >11  is abnormal 4    Family History  Problem Relation Name Age of Onset  . Cancer Mother brothers  me cousins    . Depression Mother brothers  me cousins    . Arthritis Mother brothers  me cousins    . Alcoholism Father lawrence   . Alcohol/Drug Abuse Father lawrence   . Arthritis Father lawrence   . Alcohol/Drug Abuse Maternal Uncle mother   . Arthritis Maternal Uncle mother        dead    Current Outpatient Medications on File Prior to Visit  Medication Sig Dispense Refill  . apixaban  (ELIQUIS ) 2.5 mg tab Take 1 tablet by mouth twice daily for 30 days after surgery to prevent blood clots    . butalbital -acetaminophen -caff 50-325-40 mg per tablet Take 1 Tablet by mouth.    . ciclopirox  (PENLAC ) 8 % solution Apply topically at bedtime. Apply over nail and surrounding skin. Apply daily over previous coat. After 7 days, may remove with alcohol and continue cycle.    . docusate sodium  (COLACE) 100 mg capsule Take 100 mg by mouth.    . HYDROmorphone  (DILAUDID ) 2 mg tablet Take 2-4 mg by mouth.    . losartan  (COZAAR ) 50 mg tablet Take 1 Tablet by mouth once daily.    . oxyCODONE  (ROXICODONE ) 10 mg tab tablet Take 10 mg by mouth every 6 (six) hours as needed.    . pantoprazole  (PROTONIX ) 40 mg EC tablet Take 40 mg by mouth.    . propranoloL (INDERAL) 60 mg tablet TAKE 1 TABLET BY MOUTH 2 TIMES DAILY FOR 90 DAYS.(NEED PRIMARY INS) Oral; Duration: 30 Days    . QUEtiapine  (SEROQUEL ) 200 mg tablet Take 200 mg by mouth.    . sildenafiL (VIAGRA) 100 mg tablet Take 100 mg by mouth once daily as needed.    . sucralfate  (CARAFATE ) 1 gram tablet Take 1 g by mouth.    . carvediloL  (COREG ) 6.25 mg tablet TAKE 1 TABLET BY MOUTH 2 TIMES DAILY WITH MEALS 60 Tablet 2  . topiramate  (TOPAMAX ) 50 mg tablet Take 50 mg by mouth 2 (two) times daily.    . DULoxetine  (CYMBALTA ) 30 mg DR capsule TAKE 1 CAPSULE BY MOUTH 2 TIMES DAILY 60 Capsule 2  . metFORMIN (GLUCOPHAGE) 500 mg tablet Take 1 Tablet by mouth once daily  with breakfast. 30 Tablet 2  . albuterol  HFA 90 mcg/actuation inhaler Inhale 2 Puffs into the lungs every 4 (four) hours as needed for shortness of breath, wheezing or other reason (cough) 30 g 2  . atorvastatin  (LIPITOR) 80 mg tablet Take 1 Tablet by  mouth once daily 90 Tablet 1  . alprostadiL 40 mcg kit Inject into the base of the penis    . EMGALITY  PEN 120 mg/mL pnij inject 1 pen into THE SKIN every 30 DAYS    . folic acid  (FOLVITE ) 1 mg tablet Take 1 mg by mouth Daily    . hydrOXYzine  HCL (ATARAX ) 10 mg tablet Take 10 mg by mouth    . latanoprost  (XALATAN ) 0.005 % ophthalmic solution Apply 1 Drop to eye nightly at bedtime    . methocarbamoL  (ROBAXIN ) 500 mg tablet Take 500 mg by mouth 2 (two) times daily    . multivitamin tablet Take 1 Tablet by mouth once daily    . naloxone  (NARCAN ) 4 mg/actuation nasal spray Place into the nostril(s)    . ondansetron  HCL (ZOFRAN ) 4 mg tablet Take 4 mg by mouth    . thiamine  mononitrate (VITAMIN B1) tablet Take 100 mg by mouth twice a day    . UBRELVY  50 mg tab Take 1 Tablet by mouth as needed    . ENTRESTO  49-51 mg tab Take 1 Tablet by mouth 2 (two) times daily    . FARXIGA  10 mg tab Take 10 mg by mouth every morning    . spironolactone  (ALDACTONE ) 25 mg tablet Take 25 mg by mouth once daily    . albuterol -budesonide 90-80 mcg/actuation HFAA Inhale 2 Inhalations into the lungs 3 (three) times daily.    . calcium  carbonate-vitamin D3 (CALCIUM  600 + D,3,) 600 mg-10 mcg (400 unit) tablet Take 1 Tablet by mouth once daily Indications: low vitamin D  levels.    . famotidine  (PEPCID ) 20 mg tablet Take 20 mg by mouth 2 (two) times daily    . FLUoxetine  (PROZAC ) 20 mg capsule Take 40 mg by mouth once daily Indications: posttraumatic stress syndrome.    . levothyroxine  (SYNTHROID , LEVOXYL ) 88 mcg tablet Take 88 mcg by mouth once daily Indications: a condition with low thyroid hormone levels    . naltrexone  (DEPADE) 50 mg tablet Take 50 mg by mouth once daily     . rifAXIMin  (XIFAXAN ) 550 mg tab Take 550 mg by mouth 2 (two) times daily Indications: adjunct therapy for chronic hepatic failure     No current facility-administered medications on file prior to visit.    Review of Systems  Constitutional:  Positive for activity change (recent TKR on L, using walker).  Respiratory:  Positive for apnea (OSA).   Cardiovascular:  Positive for leg swelling (bilat).  Gastrointestinal:  Positive for heartburn (Hiccups today).  Psychiatric/Behavioral: Negative.         Followed by [facility] for PTSD, MDD, anxiety, hx of SUD    Objective   Vitals:   05/09/24 1054  BP: 121/75  Pulse: 83  Resp: 16  Temp: 99 F (37.2 C)  TempSrc: Oral  SpO2: 95%  Weight: 236 lb (107 kg)  Height: 5' 8 (1.727 m)   Estimated body mass index is 35.88 kg/m as calculated from the following:   Height as of this encounter: 5' 8 (1.727 m).   Weight as of this encounter: 236 lb (107 kg). Facility age limit for growth %iles is 20 years.   Physical Exam  Constitutional:      Appearance: Normal appearance. Jacob Adams has obesity Eyes:     General: Lids are normal.     Comments: Bilateral arcus senilus   Cardiovascular:     Rate and Rhythm: Normal rate and regular rhythm.  Pulmonary:     Effort:  Pulmonary effort is normal.  Musculoskeletal:     Right lower leg: Edema present.     Left lower leg: Edema present.     Comments: Post op bandage on L knee, no drainage  Skin:    General: Skin is warm and dry.  Neurological:     Mental Status: Jacob Adams is alert.   Support hose/antiembolic hose bilat Using walker  Assessment and Plan   1. Examination - COMPREHENSIVE METABOLIC PANEL Routine - FE+CBC/D/PLT/TIBC/FER/RETIC Routine - MICROALBUMIN/CREATININE RATIO, URINE, RANDOM Urine Routine - LIPID PANEL Routine - HEPATITIS C ANTIBODY Routine - HEPATITIS B SURF ANTIBODY HBSAB Routine - IAAD IA HEPATITIS B SURFACE ANTIGEN Routine  2. DM (diabetes mellitus) with complications  (CMS & HHS-HCC) - COMPREHENSIVE METABOLIC PANEL Routine - FE+CBC/D/PLT/TIBC/FER/RETIC Routine - MICROALBUMIN/CREATININE RATIO, URINE, RANDOM Urine Routine - LIPID PANEL Routine  3. Primary hypertension  4. Mixed hyperlipidemia  5. Obstructive sleep apnea of adult  6. Anxiety disorder due to known physiological condition  7. Alcohol use, unspecified, in remission  8. Cocaine dependence in remission (CMS & HHS-HCC)  9. Status post right knee replacement Comments: 05-05-2024  10. MDD (major depressive disorder), recurrent, severe, with psychosis (CMS & HHS-HCC)  11. Chronic systolic congestive heart failure (CMS & HHS-HCC)  12. Alcoholic cirrhosis of liver without ascites (CMS & HHS-HCC)  13. 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 Chronic systolic heart failure (CMS & HHS-HCC)  15. At risk for polypharmacy

## 2024-05-10 NOTE — Result Encounter Note (Signed)
 Please go to hospital for lab recheck and evaluation: Kidney function has worsened and acid build up in blood since discharge from hospital.  Also, there is a mild reduction in hemoglobin.   Since taking blood thinner and recent surgery and all of the medications you take probably safest plan is to go back to hospital ED to have an evaluation.  Might need to stop blood thinner or get intravenous fluids or change medication.

## 2024-05-10 NOTE — Telephone Encounter (Signed)
 Spoke to pt regarding lab results and provider note.  Pt verbalized understanding and stated he would report to the ER.

## 2024-05-11 ENCOUNTER — Encounter (HOSPITAL_COMMUNITY): Payer: Self-pay

## 2024-05-11 ENCOUNTER — Inpatient Hospital Stay (HOSPITAL_COMMUNITY)
Admission: EM | Admit: 2024-05-11 | Discharge: 2024-05-15 | DRG: 381 | Disposition: A | Discharge status: Home / Self Care | Payer: MEDICAID | Attending: Family Medicine | Admitting: Family Medicine

## 2024-05-11 ENCOUNTER — Emergency Department (HOSPITAL_COMMUNITY): Payer: MEDICAID

## 2024-05-11 ENCOUNTER — Inpatient Hospital Stay (HOSPITAL_COMMUNITY): Payer: MEDICAID

## 2024-05-11 ENCOUNTER — Other Ambulatory Visit: Payer: Self-pay

## 2024-05-11 DIAGNOSIS — Z789 Other specified health status: Secondary | ICD-10-CM

## 2024-05-11 DIAGNOSIS — Z800 Family history of malignant neoplasm of digestive organs: Secondary | ICD-10-CM

## 2024-05-11 DIAGNOSIS — Y831 Surgical operation with implant of artificial internal device as the cause of abnormal reaction of the patient, or of later complication, without mention of misadventure at the time of the procedure: Secondary | ICD-10-CM | POA: Diagnosis present

## 2024-05-11 DIAGNOSIS — Z886 Allergy status to analgesic agent status: Secondary | ICD-10-CM | POA: Diagnosis not present

## 2024-05-11 DIAGNOSIS — Z560 Unemployment, unspecified: Secondary | ICD-10-CM | POA: Diagnosis not present

## 2024-05-11 DIAGNOSIS — E785 Hyperlipidemia, unspecified: Secondary | ICD-10-CM | POA: Diagnosis present

## 2024-05-11 DIAGNOSIS — E039 Hypothyroidism, unspecified: Secondary | ICD-10-CM | POA: Diagnosis present

## 2024-05-11 DIAGNOSIS — K922 Gastrointestinal hemorrhage, unspecified: Secondary | ICD-10-CM | POA: Diagnosis present

## 2024-05-11 DIAGNOSIS — Z7901 Long term (current) use of anticoagulants: Secondary | ICD-10-CM

## 2024-05-11 DIAGNOSIS — Z7984 Long term (current) use of oral hypoglycemic drugs: Secondary | ICD-10-CM

## 2024-05-11 DIAGNOSIS — E119 Type 2 diabetes mellitus without complications: Secondary | ICD-10-CM | POA: Diagnosis present

## 2024-05-11 DIAGNOSIS — Z96652 Presence of left artificial knee joint: Secondary | ICD-10-CM

## 2024-05-11 DIAGNOSIS — F1010 Alcohol abuse, uncomplicated: Secondary | ICD-10-CM | POA: Diagnosis present

## 2024-05-11 DIAGNOSIS — B3781 Candidal esophagitis: Secondary | ICD-10-CM | POA: Diagnosis present

## 2024-05-11 DIAGNOSIS — K2211 Ulcer of esophagus with bleeding: Principal | ICD-10-CM | POA: Diagnosis present

## 2024-05-11 DIAGNOSIS — Z811 Family history of alcohol abuse and dependence: Secondary | ICD-10-CM

## 2024-05-11 DIAGNOSIS — G43909 Migraine, unspecified, not intractable, without status migrainosus: Secondary | ICD-10-CM | POA: Diagnosis present

## 2024-05-11 DIAGNOSIS — K625 Hemorrhage of anus and rectum: Secondary | ICD-10-CM | POA: Diagnosis not present

## 2024-05-11 DIAGNOSIS — Z880 Allergy status to penicillin: Secondary | ICD-10-CM

## 2024-05-11 DIAGNOSIS — R000 Tachycardia, unspecified: Secondary | ICD-10-CM | POA: Diagnosis not present

## 2024-05-11 DIAGNOSIS — F319 Bipolar disorder, unspecified: Secondary | ICD-10-CM | POA: Diagnosis present

## 2024-05-11 DIAGNOSIS — Z79899 Other long term (current) drug therapy: Secondary | ICD-10-CM

## 2024-05-11 DIAGNOSIS — Z96653 Presence of artificial knee joint, bilateral: Secondary | ICD-10-CM | POA: Diagnosis present

## 2024-05-11 DIAGNOSIS — D6832 Hemorrhagic disorder due to extrinsic circulating anticoagulants: Secondary | ICD-10-CM | POA: Diagnosis present

## 2024-05-11 DIAGNOSIS — D62 Acute posthemorrhagic anemia: Secondary | ICD-10-CM | POA: Diagnosis present

## 2024-05-11 DIAGNOSIS — Z8249 Family history of ischemic heart disease and other diseases of the circulatory system: Secondary | ICD-10-CM

## 2024-05-11 DIAGNOSIS — Z79890 Hormone replacement therapy (postmenopausal): Secondary | ICD-10-CM | POA: Diagnosis not present

## 2024-05-11 DIAGNOSIS — Z91048 Other nonmedicinal substance allergy status: Secondary | ICD-10-CM

## 2024-05-11 DIAGNOSIS — F1990 Other psychoactive substance use, unspecified, uncomplicated: Secondary | ICD-10-CM | POA: Diagnosis present

## 2024-05-11 DIAGNOSIS — D649 Anemia, unspecified: Secondary | ICD-10-CM | POA: Insufficient documentation

## 2024-05-11 DIAGNOSIS — T8484XA Pain due to internal orthopedic prosthetic devices, implants and grafts, initial encounter: Secondary | ICD-10-CM | POA: Diagnosis present

## 2024-05-11 DIAGNOSIS — I110 Hypertensive heart disease with heart failure: Secondary | ICD-10-CM | POA: Diagnosis present

## 2024-05-11 DIAGNOSIS — I5022 Chronic systolic (congestive) heart failure: Secondary | ICD-10-CM | POA: Diagnosis present

## 2024-05-11 DIAGNOSIS — K51911 Ulcerative colitis, unspecified with rectal bleeding: Secondary | ICD-10-CM | POA: Diagnosis present

## 2024-05-11 DIAGNOSIS — I493 Ventricular premature depolarization: Secondary | ICD-10-CM | POA: Diagnosis present

## 2024-05-11 DIAGNOSIS — F17210 Nicotine dependence, cigarettes, uncomplicated: Secondary | ICD-10-CM | POA: Diagnosis present

## 2024-05-11 DIAGNOSIS — Z91014 Allergy to mammalian meats: Secondary | ICD-10-CM

## 2024-05-11 DIAGNOSIS — G4733 Obstructive sleep apnea (adult) (pediatric): Secondary | ICD-10-CM | POA: Diagnosis present

## 2024-05-11 DIAGNOSIS — K5900 Constipation, unspecified: Secondary | ICD-10-CM | POA: Diagnosis present

## 2024-05-11 DIAGNOSIS — F1090 Alcohol use, unspecified, uncomplicated: Secondary | ICD-10-CM | POA: Diagnosis not present

## 2024-05-11 DIAGNOSIS — K219 Gastro-esophageal reflux disease without esophagitis: Secondary | ICD-10-CM | POA: Diagnosis present

## 2024-05-11 LAB — COMPREHENSIVE METABOLIC PANEL WITH GFR
ALT: 22 U/L (ref 0–44)
AST: 33 U/L (ref 15–41)
Albumin: 2.6 g/dL — ABNORMAL LOW (ref 3.5–5.0)
Alkaline Phosphatase: 57 U/L (ref 38–126)
Anion gap: 11 (ref 5–15)
BUN: 16 mg/dL (ref 6–20)
CO2: 20 mmol/L — ABNORMAL LOW (ref 22–32)
Calcium: 8.7 mg/dL — ABNORMAL LOW (ref 8.9–10.3)
Chloride: 104 mmol/L (ref 98–111)
Creatinine, Ser: 1.06 mg/dL (ref 0.61–1.24)
GFR, Estimated: >60 mL/min (ref >60)
Glucose, Bld: 103 mg/dL — ABNORMAL HIGH (ref 70–99)
Potassium: 3.8 mmol/L (ref 3.5–5.1)
Sodium: 135 mmol/L (ref 135–145)
Total Bilirubin: 1 mg/dL (ref 0.0–1.2)
Total Protein: 6.8 g/dL (ref 6.5–8.1)

## 2024-05-11 LAB — CBC
HCT: 26.2 % — ABNORMAL LOW (ref 39.0–52.0)
Hemoglobin: 8.7 g/dL — ABNORMAL LOW (ref 13.0–17.0)
MCH: 32.3 pg (ref 26.0–34.0)
MCHC: 33.2 g/dL (ref 30.0–36.0)
MCV: 97.4 fL (ref 80.0–100.0)
Platelets: 317 K/uL (ref 150–400)
RBC: 2.69 MIL/uL — ABNORMAL LOW (ref 4.22–5.81)
RDW: 13.8 % (ref 11.5–15.5)
WBC: 9.4 K/uL (ref 4.0–10.5)
nRBC: 0 % (ref 0.0–0.2)

## 2024-05-11 LAB — TYPE AND SCREEN
ABO/RH(D): O POS
Antibody Screen: NEGATIVE

## 2024-05-11 LAB — I-STAT CG4 LACTIC ACID, ED: Lactic Acid, Venous: 1.1 mmol/L (ref 0.5–1.9)

## 2024-05-11 LAB — ETHANOL: Alcohol, Ethyl (B): <15 mg/dL (ref <15)

## 2024-05-11 LAB — POC OCCULT BLOOD, ED: Fecal Occult Bld: NEGATIVE

## 2024-05-11 MED ORDER — BACLOFEN 10 MG PO TABS
10.0000 mg | ORAL_TABLET | Freq: Three times a day (TID) | ORAL | Status: DC
  Administered 2024-05-12 (×2): 10 mg via ORAL
  Filled 2024-05-11 (×2): qty 1

## 2024-05-11 MED ORDER — SODIUM CHLORIDE 0.9 % IV BOLUS
1000.0000 mL | Freq: Once | INTRAVENOUS | Status: AC
  Administered 2024-05-11: 1000 mL via INTRAVENOUS

## 2024-05-11 MED ORDER — SODIUM CHLORIDE 0.9 % IV SOLN
12.5000 mg | Freq: Once | INTRAVENOUS | Status: AC
  Administered 2024-05-11: 12.5 mg via INTRAVENOUS
  Filled 2024-05-11: qty 0.5

## 2024-05-11 MED ORDER — HYDROMORPHONE HCL 1 MG/ML IJ SOLN
1.0000 mg | Freq: Once | INTRAMUSCULAR | Status: AC
  Administered 2024-05-11: 1 mg via INTRAVENOUS
  Filled 2024-05-11: qty 1

## 2024-05-11 MED ORDER — ACETAMINOPHEN 500 MG PO TABS
1000.0000 mg | ORAL_TABLET | Freq: Three times a day (TID) | ORAL | Status: DC
  Administered 2024-05-12 – 2024-05-15 (×12): 1000 mg via ORAL
  Filled 2024-05-11 (×12): qty 2

## 2024-05-11 MED ORDER — THIAMINE MONONITRATE 100 MG PO TABS
100.0000 mg | ORAL_TABLET | Freq: Every day | ORAL | Status: DC
  Administered 2024-05-12 – 2024-05-15 (×4): 100 mg via ORAL
  Filled 2024-05-11 (×4): qty 1

## 2024-05-11 MED ORDER — OXYCODONE HCL 5 MG PO TABS
5.0000 mg | ORAL_TABLET | Freq: Four times a day (QID) | ORAL | Status: DC | PRN
  Administered 2024-05-12 (×4): 10 mg via ORAL
  Filled 2024-05-11 (×4): qty 2

## 2024-05-11 MED ORDER — FENTANYL CITRATE PF 50 MCG/ML IJ SOSY
100.0000 ug | PREFILLED_SYRINGE | Freq: Once | INTRAMUSCULAR | Status: AC
  Administered 2024-05-11: 100 ug via INTRAMUSCULAR

## 2024-05-11 MED ORDER — PANTOPRAZOLE SODIUM 40 MG IV SOLR
40.0000 mg | Freq: Two times a day (BID) | INTRAVENOUS | Status: DC
  Administered 2024-05-12 – 2024-05-15 (×7): 40 mg via INTRAVENOUS
  Filled 2024-05-11 (×7): qty 10

## 2024-05-11 MED ORDER — FAMOTIDINE IN NACL 20-0.9 MG/50ML-% IV SOLN
20.0000 mg | Freq: Once | INTRAVENOUS | Status: AC
  Administered 2024-05-11: 20 mg via INTRAVENOUS
  Filled 2024-05-11: qty 50

## 2024-05-11 MED ORDER — PANTOPRAZOLE SODIUM 40 MG IV SOLR
80.0000 mg | Freq: Once | INTRAVENOUS | Status: AC
  Administered 2024-05-11: 80 mg via INTRAVENOUS
  Filled 2024-05-11: qty 20

## 2024-05-11 MED ORDER — DULOXETINE HCL 30 MG PO CPEP
30.0000 mg | ORAL_CAPSULE | Freq: Two times a day (BID) | ORAL | Status: DC
  Administered 2024-05-12 – 2024-05-15 (×8): 30 mg via ORAL
  Filled 2024-05-11 (×8): qty 1

## 2024-05-11 MED ORDER — FOLIC ACID 1 MG PO TABS
1.0000 mg | ORAL_TABLET | Freq: Every day | ORAL | Status: DC
  Administered 2024-05-12 – 2024-05-15 (×4): 1 mg via ORAL
  Filled 2024-05-11 (×4): qty 1

## 2024-05-11 MED ORDER — LEVOTHYROXINE SODIUM 88 MCG PO TABS
88.0000 ug | ORAL_TABLET | Freq: Every day | ORAL | Status: DC
  Administered 2024-05-12 – 2024-05-15 (×4): 88 ug via ORAL
  Filled 2024-05-11 (×4): qty 1

## 2024-05-11 MED ORDER — ADULT MULTIVITAMIN W/MINERALS CH
1.0000 | ORAL_TABLET | Freq: Every day | ORAL | Status: DC
  Administered 2024-05-12 – 2024-05-15 (×4): 1 via ORAL
  Filled 2024-05-11 (×4): qty 1

## 2024-05-11 MED ORDER — SODIUM CHLORIDE 0.9% FLUSH
3.0000 mL | Freq: Two times a day (BID) | INTRAVENOUS | Status: DC
  Administered 2024-05-12 – 2024-05-15 (×6): 3 mL via INTRAVENOUS

## 2024-05-11 MED ORDER — MORPHINE SULFATE (PF) 4 MG/ML IV SOLN
4.0000 mg | Freq: Once | INTRAVENOUS | Status: AC
  Administered 2024-05-11: 4 mg via INTRAVENOUS
  Filled 2024-05-11: qty 1

## 2024-05-11 MED ORDER — ALBUTEROL SULFATE (2.5 MG/3ML) 0.083% IN NEBU: 3.0000 mL | INHALATION_SOLUTION | Freq: Four times a day (QID) | RESPIRATORY_TRACT | Status: DC | PRN

## 2024-05-11 MED ORDER — ONDANSETRON HCL 4 MG PO TABS
4.0000 mg | ORAL_TABLET | Freq: Three times a day (TID) | ORAL | Status: DC | PRN
  Administered 2024-05-12: 4 mg via ORAL
  Filled 2024-05-11: qty 1

## 2024-05-11 MED ORDER — TOPIRAMATE 25 MG PO TABS
50.0000 mg | ORAL_TABLET | Freq: Two times a day (BID) | ORAL | Status: DC
  Administered 2024-05-12 – 2024-05-15 (×7): 50 mg via ORAL
  Filled 2024-05-11 (×7): qty 2

## 2024-05-11 MED ORDER — CARVEDILOL 6.25 MG PO TABS
6.2500 mg | ORAL_TABLET | Freq: Two times a day (BID) | ORAL | Status: DC
  Administered 2024-05-12 (×2): 6.25 mg via ORAL
  Filled 2024-05-11: qty 2
  Filled 2024-05-11: qty 1

## 2024-05-11 MED ORDER — LATANOPROST 0.005 % OP SOLN
1.0000 [drp] | Freq: Every day | OPHTHALMIC | Status: DC
  Administered 2024-05-13 – 2024-05-14 (×2): 1 [drp] via OPHTHALMIC
  Filled 2024-05-11 (×3): qty 2.5

## 2024-05-11 MED ORDER — DAPAGLIFLOZIN PROPANEDIOL 10 MG PO TABS
10.0000 mg | ORAL_TABLET | Freq: Every day | ORAL | Status: DC
  Administered 2024-05-12 – 2024-05-15 (×4): 10 mg via ORAL
  Filled 2024-05-11 (×4): qty 1

## 2024-05-11 MED ORDER — IOHEXOL 350 MG/ML SOLN
75.0000 mL | Freq: Once | INTRAVENOUS | Status: AC | PRN
  Administered 2024-05-11: 75 mL via INTRAVENOUS

## 2024-05-11 MED ORDER — QUETIAPINE FUMARATE 50 MG PO TABS
200.0000 mg | ORAL_TABLET | Freq: Every day | ORAL | Status: DC
  Administered 2024-05-12 – 2024-05-14 (×4): 200 mg via ORAL
  Filled 2024-05-11: qty 4
  Filled 2024-05-11: qty 8
  Filled 2024-05-11 (×2): qty 4

## 2024-05-11 MED ORDER — ATORVASTATIN CALCIUM 80 MG PO TABS
80.0000 mg | ORAL_TABLET | Freq: Every day | ORAL | Status: DC
  Administered 2024-05-12 – 2024-05-14 (×4): 80 mg via ORAL
  Filled 2024-05-11: qty 2
  Filled 2024-05-11 (×3): qty 1

## 2024-05-11 NOTE — Assessment & Plan Note (Signed)
 Hypothyroidism - Levothyroxine  88mcg daily Bipolar disorder - Seroquel  200mg  daily T2DM - well controlled w/ A1c 5.8, held home metformin, CBGs q8h HTN - Continue home carvedilol , farxiga , Held metoprolol , propranolol, losartan , entresto  and spironolactone .  MDD - Prozac  20mg  daily HLD - Atorvastatin  80mg  daily

## 2024-05-11 NOTE — H&P (Cosign Needed)
 Hospital Admission History and Physical Service Pager: [phone]  Patient name: Jacob Adams Medical record number: [id number] Date of Birth: October 03, 1966 Age: 58 y.o. Gender: male  Primary Care Provider: Starleen Monte Consultants: GI Code Status: Full code Preferred Emergency Contact:  Contact Information     Name Relation Home Work Mobile   [place] Sister   [phone]   [place], [place] [phone]     LAYMAN ABBY Silk   [phone]      Other Contacts   None on File      Chief Complaint: GI Bleeding  Differential and Medical Decision Making:  Jacob Adams is a 58 y.o. male presenting with bloody stools x 5 days. Differential for this patient's presentation of this includes recent eliquis  use could lead to worsening bleeding from pre existing erosion), internal hemorrhoids (given clinical description of bloody BM, though patient denies previous bloody bowel movements), complications from UC (prior diagnosis of UC, possible related rectal bleeding),   Differential for patient's tachycardia includes anemia (hgb 8.7 from 13 2 weeks prior), pain (pt reporting uncontrolled pain after his TKA), or pulmonary embolism (pt satting 100% on room air but endorses dyspnea which he attributes to his frequent and persistent hiccups).  Assessment & Plan Acute lower GI bleeding Reports bloody stools since he began on eliquis  post op 5 days ago, d/c'd per his PCP 7/22. Hx of UC, alcohol use disorder as other possible contributing factors. Hgb 8.7 on admission, baseline Hgb ~13. Denies hematemesis, low concern for upper GI bleed.  -Admitted to FMTS, Dr. Rosalynn attending -GI consulted, appreciate their input -Protonix  IV 40mg  BID -Transfusion threshold 7 -AM CBC and BMP -NPO -Holding eliquis  -SCDs for DVT prophylaxis -Continuous cardiac monitoring Tachycardia Likely secondary to anemia as above but given chest pain and dyspnea consider secondary etiology. Wells score 3,  will proceed with CT PE given higher risk with recent procedure and stopped Eliquis  on 7/22. EKG sinus tachycardia w/ PVCs  - CTA negative for PE  Substance use disorder Last drink 7/23 around noon. No withdrawal symptoms but has h/o withdrawal with seizure previously. 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 -Will give Tylenol  1000mg  q8h and -Oxycodone  5-10mg  q6h for breakthrough pain -PT/OT eval ordered GERD (gastroesophageal reflux disease) Frequent bothersome hiccups and  reflux symptoms, he reports hiccups occasionally affecting speech and normal breathing -start Protonix  IV 40mg  BID  Chronic health problem Hypothyroidism - Levothyroxine  daily Bipolar disorder - Seroquel  200mg  daily T2DM - well controlled w/ A1c 5.8, held home metformin, CBGs q8h HTN - Continue home carvedilol , farxiga ,   MDD - Prozac  20mg  daily HLD - Atorvastatin  80mg  daily  FEN/GI: NPO  VTE Prophylaxis: SCDs given active bleeding  Disposition: Med-Tele  History of Present Illness:  Jacob Adams is a 59 y.o. male presenting with new blood in stools x5 days. He describes his BM as hard and difficult to pass w/o pain, turning the stool and toilet water red as well as having blood on the toilet paper when he wipes. He endorses a couple of episodes of vomiting since his surgery w/o hematemesis. Has had limited PO intake since his surgery, stating that he has mild nausea but food just hasn't been tasting good to him lately. Of note he reports taking  post op eliquis  as prescribed until yesterday when he stopped taking it per PCP recommendation.   Told by PCP to come in and have bloodwork done after recent OP lab tests were abnormal. Felt like his knee was getting bigger and stiff  with pain.  Knee replacement 05/05/2024, went home and took 7-8 Oxycodone  without relief of symptoms.   Also having persistent hiccups and reflux. Bouts of difficulty breathing with burning since Saturday. Denies chest pain, palpitations and dyspnea other than feeling like taking a breath is hard at times with his frequent hiccupping.   In the ED, pt presented tachycardic and hypertensive but sating 100% on room air w/o respiratory distress. Workup available at admission included CBC significant for H/H 8.7/26.2 (down from 13/38 2w prior) Plt 317 and CMP w Na 135 K 3.8 Cr 1.06 and normal LFTs. FOBT negative. EKG showing no concern for ischemia. GI consulted.   Review Of Systems: Per HPI  Pertinent Past Medical History: Alcohol use disorder CHF MDD T2DM Ulcerative colitis Bipolar disorder Migraine Pancreatitis OSA Remainder reviewed in history tab.   Pertinent Past Surgical History: L knee replacement - 05/05/2024 Remainder reviewed in history tab.   Pertinent Social History: Tobacco use: Current smoker, 1/2 pack per day when he has it.  Alcohol use: 2-3 days out of the week. Has a 6 pack at a time. Last drink yesterday around 12PM. Kind of trying to stop drinking but did stop taking Naltrexone . Other Substance use: Denies Lives with son, grandsons, friend  Pertinent Family History: Mother - colon cancer, CAD Father - alcoholism Remainder reviewed in history tab.   Important Outpatient Medications: *Did not take any today Atorvastatin  80mg  daily Carvedilol  6.25mg  BID Duloxetine  30mg  BID Entresto  49-51mg  BID Metformin 500mg  daily Metoprolol  succinate 50mg  daily Spironolactone  25mg  daily Famotidine  20mg  BID Farxiga  10mg  daily Levothyroxine  88mcg daily Losartan  50mg  daily Prozac  20mg  daily Naltrexone  50mg  (not taking) Proproanolol 60mg  BID Seroquel  200mg  daily  Remainder reviewed in medication history.   Objective: BP (!) 127/91   Pulse (!) 105   Temp 98.5 F  (36.9 C) (Oral)   Resp (!) 21   SpO2 100%  Exam: General: Awake, alert, NAD. Communicates clearly.  ENTM: NCAT, intact dentition. Anicteric sclera.  Cardiovascular: Tachycardia w/ no m/r/g, 2+ radial and dorsalis pedis pulses b/l w/ good capillary refill. No LE edema.  Respiratory: CTA b/l w/ normal wob on room air.  Gastrointestinal: Soft, non tender non distended.  Extremities: L knee s/p TKA. Mild effusion surrounding knee appropriate to timeline of recent operation. No erythema, discharge or breakdown of incision sutures. No TTP, knee w/ limited flexion ROM as expected post-op.  Neuro: Aox4, no focal deficits.  Psych: Appropriate mood and affect  Labs:  CBC BMET  Recent Labs  Lab 05/11/24 1910  WBC 9.4  HGB 8.7*  HCT 26.2*  PLT 317   Recent Labs  Lab 05/11/24 1910  NA 135  K 3.8  CL 104  CO2 20*  BUN 16  CREATININE 1.06  GLUCOSE 103*  CALCIUM  8.7*    AST 33 ALT 22  EKG: Sinus tachycardia w/ frequent PVCs and baseline artifact throughout   Imaging Studies Performed: L knee XR: Moderate joint effusion.   Manon Jester, DO 05/11/2024, 10:07 PM PGY-1, [facility]  FPTS Intern pager: [phone], text pages welcome Secure chat group [facility] Teaching Service   Upper Level Addendum:   I have seen and evaluated this patient along with Dr. Manon and  reviewed the above note, making necessary revisions as appropriate.  I agree with the medical decision making and physical exam as noted above.   Izetta Nap, DO PGY-3, [facility] Residency

## 2024-05-11 NOTE — Assessment & Plan Note (Signed)
 Likely secondary to anemia as above but given chest pain and dyspnea consider secondary etiology. Wells score 3, will proceed with CT PE given higher risk with recent procedure and stopped Eliquis  on 7/22. EKG sinus tachycardia w/ PVCs  - CTA negative for PE

## 2024-05-11 NOTE — Assessment & Plan Note (Signed)
 Patient w/ history of alcohol abuse, currently reports drinking a 6 pack about every other day. -CIWA q6h -Pt denies taking naltrexone  OP, hold for now -Thiamine  100mg  PO daily, MVI 1 tab daily, folate 1mg  daily

## 2024-05-11 NOTE — Assessment & Plan Note (Addendum)
 Reports bloody stools since he began on eliquis  post op 5 days ago, d/c'd per his PCP 7/22. Hx of UC, alcohol use disorder as other possible contributing factors. Hgb 8.7 on admission, baseline Hgb ~13. Denies hematemesis, low concern for upper GI bleed.  -Admitted to FMTS, Dr. Rosalynn attending -GI consulted, appreciate their input -Protonix  IV 40mg  BID -Transfusion threshold 7 -AM CBC and BMP -NPO -Holding eliquis  -SCDs for DVT prophylaxis -Continuous cardiac monitoring

## 2024-05-11 NOTE — Assessment & Plan Note (Signed)
 Pt w/ frequent hiccups and burning reflux occasionally affecting speech and normal breathing -Protonix  IV 40mg  BID  -S/p Famotidine  IV 20mg  x 1 -S/p chlorpromazine  12.5mg  x 1 -Baclofen  10mg  TID, held home robaxin 

## 2024-05-11 NOTE — Assessment & Plan Note (Signed)
 Pt reporting uncontrolled pain at home after his TKA on 7/18.  -Tylenol  1000mg  q8h -Baclofen  10 mg 3 times daily on board for hiccups -Oxycodone  5-10mg  q6h for breakthrough pain -S/p 1mg  dilaudid  IV, 100mcg fentanyl  IM, morphine  4mg  IV  x 1 -PT/OT eval and treat AM

## 2024-05-11 NOTE — ED Triage Notes (Signed)
 Pt came in for SOB and stiffness and 10/10 pain at recent L. Knee replacement site as well as being sent by Dr. For abnormal labs.

## 2024-05-11 NOTE — ED Notes (Signed)
 Please give an update to [phone] edwina Tegeler

## 2024-05-11 NOTE — ED Provider Notes (Signed)
 [place] [facility]ER[facility]ENC[facility] DEPARTMENT AT [facility] Provider Note   CSN: [id number] Arrival date & time: 05/11/24  1510     Patient presents with: Shortness of Breath, Abn labs, and Knee Pain   Jacob Adams is a 58 y.o. male with past medical history significant for hypertension, depression, anxiety, alcohol abuse, CHF, tobacco abuse, history of ulcerative colitis, rectal bleeding who is 5 days postop.  Patient reports that his pain has been poorly controlled with home pain medication at the knee, he denies any abdominal pain.  He does report some blood with bowel movements and having some difficulty with bowel movements since the surgery.  Does have history of GI bleeds in the past.  They had discontinued his Eliquis  but he was on Eliquis  post procedurally.  No significant estimated blood loss from OP note, no visible hemarthrosis at site of operation on exam.    Shortness of Breath Knee Pain      Prior to Admission medications   Medication Sig Start Date End Date Taking? Authorizing Provider  apixaban  (ELIQUIS ) 2.5 MG TABS tablet Take 1 tablet by mouth twice daily for 30 days after surgery to prevent blood clots 05/05/24   Jule Ronal CROME, PA-C  albuterol  (VENTOLIN  HFA) 108 (90 Base) MCG/ACT inhaler Inhale 2 puffs into the lungs every 6 (six) hours as needed for wheezing or shortness of breath.    [provider]  atorvastatin  (LIPITOR) 80 MG tablet Take 1 tablet (80 mg total) by mouth at bedtime. Patient taking differently: Take 80 mg by mouth in the morning. 01/06/24   Victoria Ruts, MD  butalbital -acetaminophen -caffeine  (FIORICET ) 50-325-40 MG tablet Take 1 tablet by mouth 2 (two) times daily as needed for headache or migraine.    [provider]  Calcium  Carb-Cholecalciferol  (CALTRATE 600+D3 PO) Take 1 tablet by mouth in the morning.    [provider]  carvedilol  (COREG ) 6.25 MG tablet Take 1 tablet (6.25 mg total) by mouth 2 (two) times daily.  01/06/24   Victoria Ruts, MD  docusate sodium  (COLACE) 100 MG capsule Take 1 capsule (100 mg total) by mouth daily as needed. Patient taking differently: Take 100 mg by mouth daily as needed for mild constipation. 02/18/24 02/17/25  Jule Ronal CROME, PA-C  DULoxetine  (CYMBALTA ) 30 MG capsule Take 1 capsule (30mg ) by mouth twice daily. Patient taking differently: Take 30 mg by mouth 2 (two) times daily. 03/13/24   Zheng, Michael, DO  FARXIGA  10 MG TABS tablet Take 10 mg by mouth in the morning. 11/24/23   [provider]  folic acid  (FOLVITE ) 1 MG tablet Take 1 tablet (1 mg total) by mouth daily. Patient taking differently: Take 1 mg by mouth in the morning. 01/18/24   Howell Lunger, DO  Galcanezumab -gnlm (EMGALITY ) 120 MG/ML SOAJ Inject 120 mg into the skin See admin instructions. Inject 120mg  subcutaneously once a month, between the 1st and 3rd. Patient not taking: Reported on 04/26/2024    [provider]  hyoscyamine  (LEVSIN AMIEL) 0.125 MG SL tablet Place 1 tablet (0.125 mg total) under the tongue every 4 (four) hours as needed. Patient not taking: Reported on 04/26/2024 03/16/24   Haze Lonni PARAS, MD  latanoprost  (XALATAN ) 0.005 % ophthalmic solution Place 1 drop into both eyes at bedtime.    [provider]  levothyroxine  (SYNTHROID ) 88 MCG tablet Take 88 mcg by mouth daily before breakfast.    [provider]  metFORMIN (GLUCOPHAGE) 500 MG tablet Take 500 mg by mouth in the  morning.    [provider]  methocarbamol  (ROBAXIN -750) 750 MG tablet Take 1 tablet (750 mg total) by mouth 3 (three) times daily as needed for muscle spasms. 02/18/24   Jule Ronal CROME, PA-C  metoprolol  succinate (TOPROL -XL) 50 MG 24 hr tablet Take 50 mg by mouth daily. Take with or immediately following a meal.    [provider]  Multiple Vitamin (MULTIVITAMIN WITH MINERALS) TABS tablet Take 1 tablet by mouth daily. Patient taking differently: Take 1 tablet by mouth  daily with breakfast. 01/18/24   Howell Lunger, DO  naltrexone  (DEPADE) 50 MG tablet Take 50 mg by mouth in the morning. Patient not taking: Reported on 05/02/2024    [provider]  ondansetron  (ZOFRAN ) 4 MG tablet Take 1 tablet (4 mg total) by mouth every 8 (eight) hours as needed for nausea or vomiting. 02/18/24   Jule Ronal CROME, PA-C  pantoprazole  (PROTONIX ) 40 MG tablet Take 1 tablet (40 mg total) by mouth daily. Patient taking differently: Take 40 mg by mouth daily as needed (for reflux). 01/02/24 04/26/24  Drusilla Sabas RAMAN, MD  polyethylene glycol powder (GLYCOLAX /MIRALAX ) 17 GM/SCOOP powder Take 17 g by mouth daily as needed for mild constipation. 01/18/24   Howell Lunger, DO  QUEtiapine  (SEROQUEL ) 200 MG tablet Take 200 mg by mouth at bedtime.    [provider]  sacubitril -valsartan  (ENTRESTO ) 49-51 MG Take 1 tablet by mouth 2 (two) times daily. 01/07/24   Victoria Ruts, MD  spironolactone  (ALDACTONE ) 25 MG tablet Take 1 tablet (25 mg total) by mouth daily. Patient taking differently: Take 25 mg by mouth in the morning. 01/07/24   Victoria Ruts, MD  sucralfate  (CARAFATE ) 1 g tablet Take 1 tablet (1 g total) by mouth 4 (four) times daily -  with meals and at bedtime. Patient not taking: Reported on 04/26/2024 03/16/24   Haze Lonni PARAS, MD  thiamine  (VITAMIN B1) 100 MG tablet Take 1 tablet (100 mg total) by mouth daily. Patient taking differently: Take 100 mg by mouth in the morning. 01/18/24   Howell Lunger, DO  topiramate  (TOPAMAX ) 50 MG tablet Take 50 mg by mouth 2 (two) times daily.    [provider]  Ubrogepant  (UBRELVY ) 50 MG TABS Take 50 mg by mouth daily as needed (for migraines).    [provider]    Allergies: Nsaids, Motrin  [ibuprofen ], Sunscreens, Penicillins, and [name]    Review of Systems  Respiratory:  Positive for shortness of breath.   All other systems reviewed and are negative.   Updated Vital Signs BP  139/84   Pulse (!) 102   Temp 98.5 F (36.9 C) (Oral)   Resp 20   SpO2 98%   Physical Exam Vitals and nursing note reviewed.  Constitutional:      General: He is not in acute distress.    Appearance: Normal appearance.  HENT:     Head: Normocephalic and atraumatic.  Eyes:     General:        Right eye: No discharge.        Left eye: No discharge.  Cardiovascular:     Rate and Rhythm: Regular rhythm. Tachycardia present.     Heart sounds: No murmur heard.    No friction rub. No gallop.     Comments: Mild tachycardia, normal rhythm Pulmonary:     Effort: Pulmonary effort is normal.     Breath sounds: Normal breath sounds.  Abdominal:     General: Bowel sounds are normal.  Palpations: Abdomen is soft.     Comments: No significant ttp throughout abdomen  Genitourinary:    Comments: Soft brown stool without blood in rectal vault Musculoskeletal:     Comments: Normal post op appearance of left knee, no redness, normal range of motion to flexion, extension passively, no warmth, mild swelling as expected, sutures are in place and appear to be healing appropriately  Skin:    General: Skin is warm and dry.     Capillary Refill: Capillary refill takes less than 2 seconds.  Neurological:     Mental Status: He is alert and oriented to person, place, and time.  Psychiatric:        Mood and Affect: Mood normal.        Behavior: Behavior normal.     (all labs ordered are listed, but only abnormal results are displayed) Labs Reviewed  COMPREHENSIVE METABOLIC PANEL WITH GFR - Abnormal; Notable for the following components:      Result Value   CO2 20 (*)    Glucose, Bld 103 (*)    Calcium  8.7 (*)    Albumin 2.6 (*)    All other components within normal limits  CBC - Abnormal; Notable for the following components:   RBC 2.69 (*)    Hemoglobin 8.7 (*)    HCT 26.2 (*)    All other components within normal limits  ETHANOL  POC OCCULT BLOOD, ED  I-STAT CG4 LACTIC ACID, ED   TYPE AND SCREEN    EKG: EKG Interpretation Date/Time:  Wednesday May 11 2024 15:58:16 EDT Ventricular Rate:  115 PR Interval:  164 QRS Duration:  82 QT Interval:  312 QTC Calculation: 431 R Axis:   -25  Text Interpretation: Sinus tachycardia with frequent Premature ventricular complexes Septal infarct , age undetermined Abnormal ECG When compared with ECG of 15-Mar-2024 19:03, PREVIOUS ECG IS PRESENT Confirmed by Patt Alm DEL [id number]) on 05/11/2024 7:04:52 PM  Radiology: ARCOLA Knee 2 Views Left Result Date: 05/11/2024 CLINICAL DATA:  Postop pain. EXAM: LEFT KNEE - 1-2 VIEW COMPARISON:  05/05/2024. FINDINGS: Left knee arthroplasty. Moderate joint effusion. No acute osseous abnormality. IMPRESSION: Moderate joint effusion. Electronically Signed   By: Newell Eke M.D.   On: 05/11/2024 18:24     Procedures   Medications Ordered in the ED  chlorproMAZINE  (THORAZINE ) 12.5 mg in sodium chloride  0.9 % 25 mL IVPB (has no administration in time range)  morphine  (PF) 4 MG/ML injection 4 mg (4 mg Intravenous Given 05/11/24 1910)  fentaNYL  (SUBLIMAZE ) injection 100 mcg (100 mcg Intramuscular Given by Other 05/11/24 1900)  sodium chloride  0.9 % bolus 1,000 mL (0 mLs Intravenous Stopped 05/11/24 2018)  famotidine  (PEPCID ) IVPB 20 mg premix (0 mg Intravenous Stopped 05/11/24 2112)  pantoprazole  (PROTONIX ) injection 80 mg (80 mg Intravenous Given 05/11/24 2042)  HYDROmorphone  (DILAUDID ) injection 1 mg (1 mg Intravenous Given 05/11/24 2043)                                    Medical Decision Making Amount and/or Complexity of Data Reviewed Labs: ordered. Radiology: ordered.  Risk Prescription drug management.   This patient is a 58 y.o. male  who presents to the ED for concern of anemia post op.   Differential diagnoses prior to evaluation: The emergent differential diagnosis includes, but is not limited to,  post op anemia, hemarthrosis, vs GI bleed, acute unstable bleed vs slow bleed .  This is not an exhaustive differential.   Past Medical History / Co-morbidities / Social History: hypertension, depression, anxiety, alcohol abuse, CHF, tobacco abuse, history of ulcerative colitis, rectal bleeding who is 5 days postop  Additional history: Chart reviewed. Pertinent results include: reviewed his note from recent operation with no significant blood loss, creatinine had reportedly worsened to around 2 from recent baseline around 1.4, and HGB downtrending to 9 from baseline 13  Physical Exam: Physical exam performed. The pertinent findings include: Mild tachycardia, normal rhythm No significant ttp throughout abdomen  Soft brown stool without blood in rectal vault  Lab Tests/Imaging studies: I personally interpreted labs/imaging and the pertinent results include: CBC notable for worsening anemia, hemoglobin 8.7 from recent baseline 2 weeks ago of 13.  CMP with mild bicarb deficit, CO2 20, low albumin at 2.6, otherwise unremarkable, his Hemoccult was negative but he is reporting blood with bowel movements.  His lactic acid is normal.  Plain film radiograph of the left knee shows moderate effusion which is expected postoperatively, on physical exam again no real concern for hemarthrosis, the incision site appears appropriate, no evidence of septic joint, swelling expected. I agree with the radiologist interpretation.  Cardiac monitoring: EKG obtained and interpreted by myself and attending physician which shows: sinus tachycardia with PVCs   Medications: I ordered medication including morphine  for pain, Protonix  for suspected GI bleed, Thorazine  for hiccups for the last 2 days.  I have reviewed the patients home medicines and have made adjustments as needed.   Consults: I spoke with the family medicine team who agrees to admission for new anemia in setting of recent Eliquis  use, spoke with Dr. Leigh with GI who will consult on this patient during admission, trend  hemoglobin.  Disposition: After consideration of the diagnostic results and the patients response to treatment, I feel that patient would benefit from hospital admission as discussed above.    Final diagnoses:  Gastrointestinal hemorrhage, unspecified gastrointestinal hemorrhage type    ED Discharge Orders     None          Dhara Schepp H, PA-C 05/11/24 2123    Patt Alm Macho, MD 05/11/24 2234

## 2024-05-12 ENCOUNTER — Ambulatory Visit: Payer: MEDICAID

## 2024-05-12 DIAGNOSIS — K922 Gastrointestinal hemorrhage, unspecified: Secondary | ICD-10-CM

## 2024-05-12 DIAGNOSIS — R000 Tachycardia, unspecified: Secondary | ICD-10-CM | POA: Diagnosis not present

## 2024-05-12 DIAGNOSIS — Z789 Other specified health status: Secondary | ICD-10-CM | POA: Diagnosis not present

## 2024-05-12 DIAGNOSIS — F1990 Other psychoactive substance use, unspecified, uncomplicated: Secondary | ICD-10-CM | POA: Diagnosis not present

## 2024-05-12 DIAGNOSIS — Z7901 Long term (current) use of anticoagulants: Secondary | ICD-10-CM

## 2024-05-12 DIAGNOSIS — K625 Hemorrhage of anus and rectum: Secondary | ICD-10-CM

## 2024-05-12 DIAGNOSIS — F1010 Alcohol abuse, uncomplicated: Secondary | ICD-10-CM

## 2024-05-12 DIAGNOSIS — D62 Acute posthemorrhagic anemia: Secondary | ICD-10-CM

## 2024-05-12 DIAGNOSIS — D649 Anemia, unspecified: Secondary | ICD-10-CM | POA: Insufficient documentation

## 2024-05-12 DIAGNOSIS — Z96652 Presence of left artificial knee joint: Secondary | ICD-10-CM

## 2024-05-12 LAB — CBC
HCT: 26.1 % — ABNORMAL LOW (ref 39.0–52.0)
HCT: 27.9 % — ABNORMAL LOW (ref 39.0–52.0)
Hemoglobin: 8.8 g/dL — ABNORMAL LOW (ref 13.0–17.0)
Hemoglobin: 9.5 g/dL — ABNORMAL LOW (ref 13.0–17.0)
MCH: 32.8 pg (ref 26.0–34.0)
MCH: 33 pg (ref 26.0–34.0)
MCHC: 33.7 g/dL (ref 30.0–36.0)
MCHC: 34.1 g/dL (ref 30.0–36.0)
MCV: 97.4 fL (ref 80.0–100.0)
MCV: 96.9 fL (ref 80.0–100.0)
Platelets: 307 K/uL (ref 150–400)
Platelets: 383 K/uL (ref 150–400)
RBC: 2.68 MIL/uL — ABNORMAL LOW (ref 4.22–5.81)
RBC: 2.88 MIL/uL — ABNORMAL LOW (ref 4.22–5.81)
RDW: 14.1 % (ref 11.5–15.5)
RDW: 14.2 % (ref 11.5–15.5)
WBC: 7.8 K/uL (ref 4.0–10.5)
WBC: 7.2 K/uL (ref 4.0–10.5)
nRBC: 0 % (ref 0.0–0.2)
nRBC: 0 % (ref 0.0–0.2)

## 2024-05-12 LAB — BASIC METABOLIC PANEL WITH GFR
Anion gap: 11 (ref 5–15)
BUN: 14 mg/dL (ref 6–20)
CO2: 20 mmol/L — ABNORMAL LOW (ref 22–32)
Calcium: 8.5 mg/dL — ABNORMAL LOW (ref 8.9–10.3)
Chloride: 103 mmol/L (ref 98–111)
Creatinine, Ser: 1.06 mg/dL (ref 0.61–1.24)
GFR, Estimated: >60 mL/min (ref >60)
Glucose, Bld: 117 mg/dL — ABNORMAL HIGH (ref 70–99)
Potassium: 3.7 mmol/L (ref 3.5–5.1)
Sodium: 134 mmol/L — ABNORMAL LOW (ref 135–145)

## 2024-05-12 LAB — CBG MONITORING, ED
Glucose-Capillary: 107 mg/dL — ABNORMAL HIGH (ref 70–99)
Glucose-Capillary: 130 mg/dL — ABNORMAL HIGH (ref 70–99)

## 2024-05-12 LAB — ABO/RH: ABO/RH(D): O POS

## 2024-05-12 LAB — GLUCOSE, CAPILLARY: Glucose-Capillary: 115 mg/dL — ABNORMAL HIGH (ref 70–99)

## 2024-05-12 MED ORDER — ALUM & MAG HYDROXIDE-SIMETH 200-200-20 MG/5ML PO SUSP
15.0000 mL | Freq: Four times a day (QID) | ORAL | Status: DC | PRN
  Administered 2024-05-12 – 2024-05-13 (×3): 15 mL via ORAL
  Filled 2024-05-12 (×3): qty 30

## 2024-05-12 MED ORDER — SODIUM CHLORIDE 0.9% FLUSH: 10.0000 mL | INTRAVENOUS | Status: DC | PRN

## 2024-05-12 MED ORDER — NA SULFATE-K SULFATE-MG SULF 17.5-3.13-1.6 GM/177ML PO SOLN
0.5000 | Freq: Once | ORAL | Status: AC
  Administered 2024-05-12: 177 mL via ORAL
  Filled 2024-05-12: qty 1

## 2024-05-12 MED ORDER — SIMETHICONE 80 MG PO CHEW
240.0000 mg | CHEWABLE_TABLET | Freq: Once | ORAL | Status: AC
  Administered 2024-05-12: 240 mg via ORAL
  Filled 2024-05-12: qty 3

## 2024-05-12 MED ORDER — POLYETHYLENE GLYCOL 3350 17 GM/SCOOP PO POWD
119.0000 g | Freq: Once | ORAL | Status: DC
  Filled 2024-05-12: qty 119

## 2024-05-12 MED ORDER — BISACODYL 5 MG PO TBEC: 15.0000 mg | DELAYED_RELEASE_TABLET | Freq: Once | ORAL | Status: DC

## 2024-05-12 MED ORDER — SODIUM CHLORIDE 0.9 % IV SOLN: INTRAVENOUS | Status: AC

## 2024-05-12 MED ORDER — NA SULFATE-K SULFATE-MG SULF 17.5-3.13-1.6 GM/177ML PO SOLN
0.5000 | Freq: Once | ORAL | Status: AC
  Administered 2024-05-12: 177 mL via ORAL

## 2024-05-12 MED ORDER — OXYCODONE HCL 5 MG PO TABS
5.0000 mg | ORAL_TABLET | ORAL | Status: DC | PRN
  Administered 2024-05-13 (×2): 10 mg via ORAL
  Filled 2024-05-12 (×2): qty 2

## 2024-05-12 MED ORDER — LORAZEPAM 1 MG PO TABS
1.0000 mg | ORAL_TABLET | Freq: Once | ORAL | Status: AC
  Administered 2024-05-13: 1 mg via ORAL
  Filled 2024-05-12: qty 1

## 2024-05-12 MED ORDER — BACLOFEN 10 MG PO TABS
10.0000 mg | ORAL_TABLET | Freq: Three times a day (TID) | ORAL | Status: DC | PRN
  Administered 2024-05-12: 10 mg via ORAL
  Filled 2024-05-12: qty 1

## 2024-05-12 MED ORDER — BACLOFEN 10 MG PO TABS: 10.0000 mg | ORAL_TABLET | Freq: Three times a day (TID) | ORAL | Status: DC | PRN

## 2024-05-12 NOTE — Hospital Course (Addendum)
 Jacob Adams is a 58 y.o. year old with a history of alcohol use disorder, ulcerative colitis CHF, MDD, BPD, T2DM, OSA who presented with bloody stools and was admitted to the [facility] Medicine Teaching Service for anemia and GI bleed.  Anemia GI Bleed Reported bloody stools since starting Eliquis  after total left knee replacement on 05/06/24. Started on IV Protonix  40 mg twice daily and GI recommended EGD and colonoscopy. EGD showed nonbleeding esophageal ulcers without sign of recent bleeding, biopsied; also with esophageal plaques concerning for candidiasis and biopsied. Switched from IV Protonix  to PO Protonix  40 mg daily and started on diflucan  100 mg daily for 3 weeks (7/25-8/15) given plaques. Colonoscopy unable to be completed given inadequate prep, per GI plan to do outpatient.  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 Pain managed inpatient with Tylenol  1000mg  q8h, baclofen  10 mg three times daily, Oxycodone  5-10mg  q6h for breakthrough pain. Home robaxin  held. PT/OT worked with patient specifically on knee flexion.  GERD Patient initially with frequent hiccupping leaving him short of breath. Trialed Protonix  and Baclofen  without relief of symptoms therefore given Haloperidol  with improvement. ***  Other chronic conditions were medically managed with home medications and formulary alternatives as necessary (substance (alcohol) use disorder, hypothyroidism, BPD, MDD, T2DM, HTN, HLD).  PCP Follow-up Recommendations: Patient to follow-up with GI outpatient for surveillance colonoscopy Follow esophageal biopsies (ulcer and plaque) Consider resuming metformin Continue to work on alcohol cessation Patient attending outpatient PT at [facility]; ensure compliance

## 2024-05-12 NOTE — Assessment & Plan Note (Signed)
 Hgb stable at 8.8 from 8.7 yesterday (baseline ~11). No symptoms of anemia.  - NPO pending GI consult - IV Protonix  40 mg twice daily - Is on carvedilol  6.25 mg twice daily, which helps with variceal prophylaxis - Labs: 1500 CBC; AM CBC, BMP - Transfusion threshold < 7 - Continuous cardiac monitoring - SCDs given active bleeding (holding post-op Eliquis ) - GI consulted, appreciated recommendations

## 2024-05-12 NOTE — Consult Note (Addendum)
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    Alcohol/week: 8.0 standard drinks of alcohol    Types: 8 Standard drinks or equivalent per week    Comment: 6-9 drinks per week   Drug use: Not Currently    Types: Crack cocaine   Sexual activity: Not Currently  Other Topics Concern   Not on file  Social History Narrative   Pt lives in [place] with his wife and 2 of his 9 children, ages 35 and 33 yo.    He is unemployed and seeking disability.   Social Drivers of Corporate investment banker Strain: Not on File (10/03/2022)   Received from [employer]    Financial Resource Strain: 0  Food Insecurity: At Risk (05/09/2024)   Received from [facility] Insecurity    Within the past 12 months, you worried that your food would run out before you got money to buy more.: 2  Transportation Needs: At Risk (05/09/2024)   Received from [facility] Needs    In the past 12 months, has  lack of transportation kept you from medical appointments, meetings, work or from getting things needed for daily living?: 2  Physical Activity: Not on File (10/03/2022)   Received from [facility]   Physical Activity    Physical Activity: 0  Stress: Not on File (10/03/2022)   Received from [facility]   Stress    Stress: 0  Social Connections: Moderately Isolated (03/10/2024)   Social Connection and Isolation Panel    Frequency of Communication with Friends and Family: More than three times a week    Frequency of Social Gatherings with Friends and Family: More than three times a week    Attends Religious Services: Never    Database administrator or Organizations: No    Attends Banker Meetings: 1 to 4 times per year    Marital Status: Separated  Intimate Partner Violence: Not At Risk (03/10/2024)   Humiliation, Afraid, Rape, and Kick questionnaire    Fear of Current or Ex-Partner: No    Emotionally Abused: No    Physically Abused: No    Sexually Abused: No  Code Status   Code Status: Full Code  Review of Systems: All systems reviewed and negative except where noted in HPI.  Physical Exam: Vital signs in last 24 hours: Temp:  [97.7 F (36.5 C)-98.8 F (37.1 C)] 98.2 F (36.8 C) (07/24 0617) Pulse Rate:  [91-122] 108 (07/24 0600) Resp:  [11-28] 11 (07/24 0600) BP: (119-155)/(84-100) 119/89 (07/24 0600) SpO2:  [96 %-100 %] 100 % (07/24 0600)    General:  Pleasant male in NAD Psych:  Cooperative. Normal mood and affect Eyes: Pupils equal Ears:  Normal auditory acuity Nose: No deformity, discharge or lesions Neck:  Supple, no masses felt Lungs:  Clear to auscultation.  Heart:  Regular rate, regular rhythm.  Abdomen:  Soft, nondistended, nontender, active bowel sounds, no masses felt Rectal : No perianal lesions.  No stool or blood in the vault on DRE  Msk: Symmetrical without gross deformities.  Neurologic:  Alert, oriented, grossly normal  neurologically Extremities : No edema Skin:  Intact without significant lesions.    Intake/Output from previous day: 07/23 0701 - 07/24 0700 In: 1075 [IV Piggyback:1075] Out: -  Intake/Output this shift:  No intake/output data recorded.   Vina Dasen, NP-C   05/12/2024, 10:09 AM

## 2024-05-12 NOTE — Assessment & Plan Note (Addendum)
 Hypothyroidism - Levothyroxine  88mcg daily Bipolar disorder - Seroquel  200mg  daily at bedtime MDD - Home Prozac  20mg  daily T2DM - Well controlled w/ A1c 5.8, held home metformin, CBGs q8h HTN - Continue home carvedilol  6.25 mg twice daily, Farxiga  10 mg daily. Held metoprolol , entresto  and spironolactone . HLD - Home Atorvastatin  80mg  daily

## 2024-05-12 NOTE — H&P (View-Only) (Signed)
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    Alcohol/week: 8.0 standard drinks of alcohol    Types: 8 Standard drinks or equivalent per week    Comment: 6-9 drinks per week   Drug use: Not Currently    Types: Crack cocaine   Sexual activity: Not Currently  Other Topics Concern   Not on file  Social History Narrative   Pt lives in [place] with his wife and 2 of his 9 children, ages 35 and 33 yo.    He is unemployed and seeking disability.   Social Drivers of Corporate investment banker Strain: Not on File (10/03/2022)   Received from [employer]    Financial Resource Strain: 0  Food Insecurity: At Risk (05/09/2024)   Received from [facility] Insecurity    Within the past 12 months, you worried that your food would run out before you got money to buy more.: 2  Transportation Needs: At Risk (05/09/2024)   Received from [facility] Needs    In the past 12 months, has  lack of transportation kept you from medical appointments, meetings, work or from getting things needed for daily living?: 2  Physical Activity: Not on File (10/03/2022)   Received from [facility]   Physical Activity    Physical Activity: 0  Stress: Not on File (10/03/2022)   Received from [facility]   Stress    Stress: 0  Social Connections: Moderately Isolated (03/10/2024)   Social Connection and Isolation Panel    Frequency of Communication with Friends and Family: More than three times a week    Frequency of Social Gatherings with Friends and Family: More than three times a week    Attends Religious Services: Never    Database administrator or Organizations: No    Attends Banker Meetings: 1 to 4 times per year    Marital Status: Separated  Intimate Partner Violence: Not At Risk (03/10/2024)   Humiliation, Afraid, Rape, and Kick questionnaire    Fear of Current or Ex-Partner: No    Emotionally Abused: No    Physically Abused: No    Sexually Abused: No  Code Status   Code Status: Full Code  Review of Systems: All systems reviewed and negative except where noted in HPI.  Physical Exam: Vital signs in last 24 hours: Temp:  [97.7 F (36.5 C)-98.8 F (37.1 C)] 98.2 F (36.8 C) (07/24 0617) Pulse Rate:  [91-122] 108 (07/24 0600) Resp:  [11-28] 11 (07/24 0600) BP: (119-155)/(84-100) 119/89 (07/24 0600) SpO2:  [96 %-100 %] 100 % (07/24 0600)    General:  Pleasant male in NAD Psych:  Cooperative. Normal mood and affect Eyes: Pupils equal Ears:  Normal auditory acuity Nose: No deformity, discharge or lesions Neck:  Supple, no masses felt Lungs:  Clear to auscultation.  Heart:  Regular rate, regular rhythm.  Abdomen:  Soft, nondistended, nontender, active bowel sounds, no masses felt Rectal : No perianal lesions.  No stool or blood in the vault on DRE  Msk: Symmetrical without gross deformities.  Neurologic:  Alert, oriented, grossly normal  neurologically Extremities : No edema Skin:  Intact without significant lesions.    Intake/Output from previous day: 07/23 0701 - 07/24 0700 In: 1075 [IV Piggyback:1075] Out: -  Intake/Output this shift:  No intake/output data recorded.   Vina Dasen, NP-C   05/12/2024, 10:09 AM

## 2024-05-12 NOTE — TOC Initial Note (Signed)
 Transition of Care (TOC) - Initial/Assessment Note   Patient from home with son, son's girlfriend and three grand children   On last discharge patient was arranged for OP PT at [facility] . Patient wants to continue. Referral entered and secure chatted MD to sign. Information on AVS.   Patient has 3 in 1 and rolling walker at home  Patient Details  Name: Jacob Adams MRN: [id number] Date of Birth: April 02, 1966  Transition of Care [facility]) CM/SW Contact:    Stephane Powell Jansky, RN Phone Number: 05/12/2024, 2:25 PM  Clinical Narrative:                   Expected Discharge Plan:  (OP PT) Barriers to Discharge: Continued Medical Work up   Patient Goals and CMS Choice Patient states their goals for this hospitalization and ongoing recovery are:: to return to home CMS Medicare.gov Compare Post Acute Care list provided to:: Patient Choice offered to / list presented to : Patient      Expected Discharge Plan and Services   Discharge Planning Services: CM Consult Post Acute Care Choice:  (OP PT) Living arrangements for the past 2 months: Single Family Home                 DME Arranged: N/A DME Agency: NA       HH Arranged: NA HH Agency: NA        Prior Living Arrangements/Services Living arrangements for the past 2 months: Single Family Home Lives with:: Adult Children Patient language and need for interpreter reviewed:: Yes Do you feel safe going back to the place where you live?: Yes      Need for Family Participation in Patient Care: Yes (Comment) Care giver support system in place?: Yes (comment) Current home services: DME Criminal Activity/Legal Involvement Pertinent to Current Situation/Hospitalization: No - Comment as needed  Activities of Daily Living   ADL Screening (condition at time of admission) Independently performs ADLs?: Yes (appropriate for developmental age) Is the patient deaf or have difficulty hearing?: No Does the patient have difficulty seeing,  even when wearing glasses/contacts?: No Does the patient have difficulty concentrating, remembering, or making decisions?: No  Permission Sought/Granted   Permission granted to share information with : Yes, Verbal Permission Granted     Permission granted to share info w AGENCY: OP PT on [address]        Emotional Assessment Appearance:: Appears stated age Attitude/Demeanor/Rapport: Engaged Affect (typically observed): Appropriate Orientation: : Oriented to Self, Oriented to Place, Oriented to  Time, Oriented to Situation Alcohol / Substance Use: Not Applicable Psych Involvement: No (comment)  Admission diagnosis:  Acute lower GI bleeding [K92.2] Gastrointestinal hemorrhage, unspecified gastrointestinal hemorrhage type [K92.2] Patient Active Problem List   Diagnosis Date Noted   Anemia 05/12/2024   Acute lower GI bleeding 05/11/2024   Substance use disorder 05/11/2024   Tachycardia 05/11/2024   GERD (gastroesophageal reflux disease) 05/11/2024   Pain due to total left knee replacement (HCC) 05/11/2024   Status post total left knee replacement 05/05/2024   Primary osteoarthritis of left knee 05/04/2024   Syncope 03/10/2024   Diarrhea 03/10/2024   Hypotension 01/17/2024   Chronic health problem 01/15/2024   AKI (acute kidney injury) (HCC) 01/15/2024   History of seizure due to alcohol withdrawal 01/15/2024   Abdominal pain 01/15/2024   Major depressive disorder, 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   Major depressive disorder, recurrent, severe without psychotic behavior (HCC) 01/02/2024   Low  back pain 12/31/2023   Lactic acid acidosis 12/31/2023   Low glucose level 12/31/2023   Suicidal behavior 12/31/2023   Chronic colitis 04/25/2022   Alcoholic cirrhosis of liver without ascites (HCC) 04/25/2022   Portal hypertensive gastropathy (HCC) 04/25/2022   History of anemia 04/25/2022   Hyponatremia 01/23/2022   High anion gap metabolic acidosis  01/23/2022   Hyperkalemia 01/23/2022   Fatty liver 01/23/2022   Insomnia 10/04/2020   AMS (altered mental status) 07/23/2020   Sleep apnea    Hypertension    Acute metabolic encephalopathy 01/24/2020   Normocytic anemia 08/15/2019   Hypothyroidism 08/15/2019   HLD (hyperlipidemia) 08/15/2019   Anxiety and depression 08/15/2019   Chronic systolic CHF (congestive heart failure) (HCC) 02/08/2019   Tobacco dependence 02/08/2019   Alcohol withdrawal (HCC) 01/2019   HTN (hypertension) 12/24/2018   Alcohol abuse 01/06/2016   MDD (major depressive disorder), recurrent, severe, 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/03/2015   Alcohol use disorder, severe, dependence (HCC) 11/03/2015   History of GI bleed 10/31/2015   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 [place] [address] Phone: [phone] Fax: [phone]  [facility] [address] [place] [address] Phone: [phone] Fax: [phone]     Social Drivers of Health (SDOH) Social History: SDOH Screenings   Food Insecurity: No Food Insecurity (05/12/2024)  Recent Concern: Food Insecurity - At Risk (05/09/2024)   Received from [facility]  Housing: Unknown (05/12/2024)  Transportation Needs: No Transportation Needs (05/12/2024)  Recent Concern: Transportation Needs - At Risk (05/09/2024)   Received from [facility]  Utilities: Not At Risk (05/12/2024)  Alcohol Screen: Low Risk  (01/04/2024)  Depression (PHQ2-9): Medium Risk (06/29/2020)  Financial Resource Strain: Not on File (10/03/2022)   Received from [facility]  Physical Activity: Not on File (10/03/2022)   Received from [facility]  Social Connections: Moderately Isolated (03/10/2024)  Stress: Not on File (10/03/2022)   Received  from [facility]  Tobacco Use: High Risk (05/11/2024)   SDOH Interventions:     Readmission Risk Interventions    01/18/2024   12:53 PM 01/01/2024    2:34 PM  Readmission Risk Prevention Plan  Post Dischage Appt  Complete  Medication Screening  Complete  Transportation Screening Complete Complete  PCP or Specialist Appt within 3-5 Days Complete   HRI or Home Care Consult Complete   Social Work Consult for Recovery Care Planning/Counseling Complete   Palliative Care Screening Not Applicable   Medication Review Oceanographer) Complete

## 2024-05-12 NOTE — Evaluation (Signed)
 Physical Therapy Evaluation Patient Details Name: Jacob Adams MRN: [id number] DOB: [birth year] Today's Date: 05/12/2024  History of Present Illness  Pt is a 58 y/o M admitted on 05/11/24 after presenting with c/o bloody stools x 5 days. Pt is being treated for acute lower GI bleeding. Pt with recent L TKA (05/05/24). PMH: HTN, depression, anxiety, alcohol abuse, CHF, tobacco abuse, ulcerative colitis, L TKA 05/05/24, hypothyroidism, bipolar disorder, DM2, MDD, HLD  Clinical Impression  Pt seen for PT evaluation with pt agreeable. Pt reports he has experienced significant L knee pain since d/c home but has been ambulatory with RW. On this date, pt is able to complete bed mobility with mod I, sit<>stand with mod I & ambulate in hallway with RW & mod I. PT educated pt on L knee ROM exercises & modifications to gait with good return demo. Will continue to follow pt acutely to progress L knee ROM & strengthening & gait as able.        If plan is discharge home, recommend the following: Assistance with cooking/housework;Assist for transportation;Help with stairs or ramp for entrance   Can travel by private vehicle        Equipment Recommendations None recommended by PT  Recommendations for Other Services       Functional Status Assessment Patient has had a recent decline in their functional status and demonstrates the ability to make significant improvements in function in a reasonable and predictable amount of time.     Precautions / Restrictions Precautions Precautions: Knee Restrictions Weight Bearing Restrictions Per Provider Order: No LLE Weight Bearing Per Provider Order: Weight bearing as tolerated      Mobility  Bed Mobility Overal bed mobility: Modified Independent Bed Mobility: Supine to Sit, Sit to Supine     Supine to sit: HOB elevated, Modified independent (Device/Increase time) Sit to supine: Modified independent (Device/Increase time), HOB elevated   General bed  mobility comments: from ED stretcher    Transfers Overall transfer level: Needs assistance Equipment used: Rolling walker (2 wheels) Transfers: Sit to/from Stand Sit to Stand: Modified independent (Device/Increase time)                Ambulation/Gait Ambulation/Gait assistance: Supervision, Modified independent (Device/Increase time) Gait Distance (Feet): 80 Feet Assistive device: Rolling walker (2 wheels) Gait Pattern/deviations: Decreased step length - right, Decreased step length - left, Decreased stride length, Decreased dorsiflexion - left Gait velocity: decreased     General Gait Details: cuing to relax BUE shoulders vs shoulder elevation, forward vs downward gaze, increased heel strike LLE with good return Oceanographer     Tilt Bed    Modified Rankin (Stroke Patients Only)       Balance Overall balance assessment: Needs assistance Sitting-balance support: No upper extremity supported, Feet supported Sitting balance-Leahy Scale: Good Sitting balance - Comments: donned shoes sitting edge of stretcher without LOB   Standing balance support: Bilateral upper extremity supported, During functional activity, Reliant on assistive device for balance Standing balance-Leahy Scale: Good                               Pertinent Vitals/Pain Pain Assessment Pain Assessment: Faces Faces Pain Scale: Hurts little more Pain Location: L knee Pain Descriptors / Indicators: Discomfort, Grimacing, Aching Pain Intervention(s): Monitored during session, Limited activity within patient's tolerance    Home Living Family/patient  expects to be discharged to:: Private residence Living Arrangements: Children;Other relatives Available Help at Discharge: Family;Available 24 hours/day Type of Home: House Home Access: Stairs to enter   [employer] of Steps: 1   Home Layout: One level;Laundry or work area in basement;Able  to live on main level with bedroom/bathroom Home Equipment: Agricultural consultant (2 wheels) Additional Comments: cpap    Prior Function Prior Level of Function : Independent/Modified Independent;Driving             Mobility Comments: ambulatory with RW since L TKA, prior to that was independent without AD, anticpating starting OPPT today ADLs Comments: independent prior to TKA     Extremity/Trunk Assessment   Upper Extremity Assessment Upper Extremity Assessment: Overall WFL for tasks assessed    Lower Extremity Assessment Lower Extremity Assessment: LLE deficits/detail LLE Deficits / Details: ~85 degrees knee flexion AAROM, ~5 degrees from full extension LLE Sensation: decreased light touch (reports slight numbness)    Cervical / Trunk Assessment Cervical / Trunk Assessment: Normal  Communication   Communication Communication: No apparent difficulties    Cognition Arousal: Alert Behavior During Therapy: WFL for tasks assessed/performed   PT - Cognitive impairments: No apparent impairments                         Following commands: Intact       Cueing Cueing Techniques: Verbal cues, Visual cues     General Comments General comments (skin integrity, edema, etc.): Pt reports he's not been using CPM as perscribed & encouraged pt to do so. Reviewed need to promote L knee extension, no pillows under knee; try bone foam 20 min on/20 min off if unable to tolerate for long periods of time; encouraged him to ambulate every hour & perform AAROM L knee flexion 10 rep 3x/day.    Exercises     Assessment/Plan    PT Assessment Patient needs continued PT services  PT Problem List Decreased strength;Pain;Decreased range of motion;Impaired sensation;Decreased activity tolerance;Decreased knowledge of use of DME;Decreased balance;Decreased mobility       PT Treatment Interventions DME instruction;Balance training;Modalities;Gait training;Neuromuscular  re-education;Functional mobility training;Stair training;Patient/family education;Therapeutic activities;Therapeutic exercise    PT Goals (Current goals can be found in the Care Plan section)  Acute Rehab PT Goals Patient Stated Goal: get better PT Goal Formulation: With patient Time For Goal Achievement: 05/26/24 Potential to Achieve Goals: Good    Frequency Min 3X/week     Co-evaluation               AM-PAC PT 6 Clicks Mobility  Outcome Measure Help needed turning from your back to your side while in a flat bed without using bedrails?: None Help needed moving from lying on your back to sitting on the side of a flat bed without using bedrails?: None Help needed moving to and from a bed to a chair (including a wheelchair)?: A Little Help needed standing up from a chair using your arms (e.g., wheelchair or bedside chair)?: None Help needed to walk in hospital room?: A Little Help needed climbing 3-5 steps with a railing? : A Little 6 Click Score: 21    End of Session   Activity Tolerance: Patient tolerated treatment well Patient left: in bed;with call bell/phone within reach Nurse Communication: Mobility status PT Visit Diagnosis: Pain;Other abnormalities of gait and mobility (R26.89);Muscle weakness (generalized) (M62.81) Pain - Right/Left: Left Pain - part of body: Knee    Time: 9081-9063 PT Time Calculation (  min) (ACUTE ONLY): 18 min   Charges:   PT Evaluation $PT Eval Low Complexity: 1 Low   PT General Charges $$ ACUTE PT VISIT: 1 Visit         Richerd Pinal, PT, DPT 05/12/24, 10:39 AM   Richerd CHRISTELLA Pinal 05/12/2024, 10:38 AM

## 2024-05-12 NOTE — Progress Notes (Addendum)
 Daily Progress Note Intern Pager: [phone]  Patient name: Jacob Adams Medical record number: [id number] Date of birth: 01-17-1966 Age: 58 y.o. Gender: male  Primary Care Provider: [facility]: GI Code Status: FULL  Pt Overview and Major Events to Date:  - 7/23: Admitted, GI consulted  Medical Decision Making:  NAREK KNISS is a 58 y.o. male presenting with bloody stools likely 2/2 acute lower GI bleeding in setting of recent Eliquis  use. Also possible contribution from alcohol use disorder, Ulcerative Colitis. Last colonoscopy in 04/2022 showed nonbleeding nonthrombosed external/internal hemorrhoids. Awaiting GI recs.  Pertinent PMH/PSH includes alcohol use disorder, ulcerative colitis CHF, MDD, BPD, T2DM, OSA.  Assessment & Plan Acute lower GI bleeding Anemia Hgb stable at 8.8 from 8.7 yesterday (baseline ~11). No symptoms of anemia.  - NPO pending GI consult - IV Protonix  40 mg twice daily - Is on carvedilol  6.25 mg twice daily, which helps with variceal prophylaxis - Labs: 1500 CBC; AM CBC, BMP - Transfusion threshold < 7 - Continuous cardiac monitoring - SCDs given active bleeding (holding post-op Eliquis ) - GI consulted, appreciated recommendations Tachycardia HR still elevated in the 100s. Suspect in setting of anemia vs chronic pain. - Continue to monitor Pain due to total left knee replacement (HCC) Left total knee replacement on 7/18 with uncontrolled pain following despite opioid management. Now improved. - Pain management: Tylenol  1000mg  q8h, baclofen  10 mg three times daily, Oxycodone  5-10mg  q6h for breakthrough pain - Holding home robaxin  - PT/OT eval; will have PT specifically work on knee flexion - If patient's Hgb remains stable tomorrow 7/25, consider adding back Eliquis  for clot prevention GERD (gastroesophageal reflux disease) Hiccups improved. - Protonix  IV 40mg  BID as above - Baclofen  10mg  TID > changing to  PRN; holding home robaxin  - Consider haldol  dose if hiccups reoccur/worsen Substance use disorder With alcohol. Last drink 7/23 @noon . Hx withdrawal with seizure. Last CIWA 0. - CIWAs q6h - Thiamine  100mg  PO daily, MVI 1 tab daily, folate 1mg  daily - Consider Naltrexone  at discharge (pt denied taking this outpatient) Chronic health problem Hypothyroidism - Levothyroxine  88mcg daily Bipolar disorder - Seroquel  200mg  daily at bedtime MDD - Home Prozac  20mg  daily T2DM - Well controlled w/ A1c 5.8, held home metformin, CBGs q8h HTN - Continue home carvedilol  6.25 mg twice daily, Farxiga  10 mg daily. Held metoprolol , entresto  and spironolactone . HLD - Home Atorvastatin  80mg  daily   FEN/GI: NPO PPx: SCDs given active bleeding Dispo:Pending PT recommendations  pending clinical improvement . Barriers include need for GI evaluation, possible imaging.   Subjective:  He reports he is feeling better today. He reports leg pain in his left leg has improved and he can now put weight on it. He denies current hiccupping. Denies dyspnea. No reported recent vomiting. No light headedness, presyncope, blurry vision.  Objective: Temp:  [97.7 F (36.5 C)-98.8 F (37.1 C)] 98.2 F (36.8 C) (07/24 0617) Pulse Rate:  [91-122] 108 (07/24 0600) Resp:  [11-28] 11 (07/24 0600) BP: (119-155)/(84-100) 119/89 (07/24 0600) SpO2:  [96 %-100 %] 100 % (07/24 0600) Physical Exam: General: Patient is lying down in hospital bed, no acute distress. Cardiovascular: Borderline tachycardia and rhythm, no murmurs/rubs/gallops. Respiratory: Normal work of breathing on room air. Clear to auscultation bilaterally; no wheezes, crackles. Abdomen: Bowel sounds present and normoactive bilaterally. Soft, nondistended, nontender. Extremities: No bilateral lower extremity edema. Skin warm, dry. Bandage over left knee with no active bleeding/weeping. Left knee tender to minimal  palpation. Limited strength exam 2/2 pain. Normal  sensation of Right leg, somewhat diminished sensation of Left leg. Able to wiggle toes of both feet. Neuro: Alert and appropriately responding to questions.  Laboratory: Most recent CBC Lab Results  Component Value Date   WBC 7.8 05/12/2024   HGB 8.8 (L) 05/12/2024   HCT 26.1 (L) 05/12/2024   MCV 97.4 05/12/2024   PLT 307 05/12/2024   Most recent BMP    Latest Ref Rng & Units 05/12/2024    3:46 AM  BMP  Glucose 70 - 99 mg/dL 882   BUN 6 - 20 mg/dL 14   Creatinine 9.38 - 1.24 mg/dL 8.93   Sodium 864 - 854 mmol/L 134   Potassium 3.5 - 5.1 mmol/L 3.7   Chloride 98 - 111 mmol/L 103   CO2 22 - 32 mmol/L 20   Calcium  8.9 - 10.3 mg/dL 8.5      Larraine Palma, MD 05/12/2024, 7:02 AM  PGY-1, [facility] FPTS Intern pager: [phone], text pages welcome Secure chat group [facility] Teaching Service

## 2024-05-12 NOTE — Assessment & Plan Note (Addendum)
 Hgb stable at 8.8 from 8.7 yesterday (baseline ~11). No symptoms of anemia.  - NPO pending GI consult - IV Protonix  40 mg twice daily - Is on carvedilol  6.25 mg twice daily, which helps with variceal prophylaxis - Labs: 1500 CBC; AM CBC, BMP - Transfusion threshold < 7 - Continuous cardiac monitoring - SCDs given active bleeding (holding post-op Eliquis ) - GI consulted, appreciated recommendations

## 2024-05-12 NOTE — Assessment & Plan Note (Addendum)
 Hiccups improved. - Protonix  IV 40mg  BID as above - Baclofen  10mg  TID > changing to PRN; holding home robaxin  - Consider haldol  dose if hiccups reoccur/worsen

## 2024-05-12 NOTE — Progress Notes (Signed)
 Patient arrived to [phone] with a 10/10 pain located in the left knee. Pain medication administered, tolerated well. Patient is A&Ox4 ambulating with a front wheel walker independently. All needs met at this time. Bed in lowest position and call light within reach.

## 2024-05-12 NOTE — Evaluation (Signed)
 Occupational Therapy Evaluation & Discharge Patient Details Name: Jacob Adams MRN: [id number] DOB: [birth year] Today's Date: 05/12/2024   History of Present Illness   Pt is a 58 y/o M admitted on 05/11/24 after presenting with c/o bloody stools x 5 days. Pt is being treated for acute lower GI bleeding. Pt with recent L TKA (05/05/24). PMH: HTN, depression, anxiety, alcohol abuse, CHF, tobacco abuse, ulcerative colitis, L TKA 05/05/24, hypothyroidism, bipolar disorder, DM2, MDD, HLD     Clinical Impressions Pt admitted based on above, and was seen based on problem list below. Since recent TKA pt has been mod I for ADLs with use of RW. Pt reports family providing prn assistance for LB dressing. Pt received attempting to ambulate to the bathroom. OT assisted the pt with line management, pt with modI for ADLs, with supervision for mobility with RW. Reinforced education from previous admission for ADLs, and encouraged safety pt to request for staff assist with mobility with lines and leads. All education complete, no further acute OT or follow up needs. OT is signing off on this pt.        If plan is discharge home, recommend the following:   A little help with walking and/or transfers     Functional Status Assessment   Patient has not had a recent decline in their functional status     Equipment Recommendations   None recommended by OT      Precautions/Restrictions   Precautions Precautions: Knee Precaution Booklet Issued: Yes (comment) Recall of Precautions/Restrictions: Intact Restrictions Weight Bearing Restrictions Per Provider Order: Yes LLE Weight Bearing Per Provider Order: Weight bearing as tolerated     Mobility Bed Mobility Overal bed mobility: Modified Independent     Transfers Overall transfer level: Needs assistance Equipment used: Rolling walker (2 wheels) Transfers: Sit to/from Stand, Bed to chair/wheelchair/BSC Sit to Stand: Modified independent  (Device/Increase time)     Step pivot transfers: Supervision     General transfer comment: Supervision for safety with RW and managing IV pole      Balance Overall balance assessment: Mild deficits observed, not formally tested     ADL either performed or assessed with clinical judgement   ADL Overall ADL's : At baseline;Modified independent       General ADL Comments: Pt at functional baseline for ADLs since TKA, mod I with increased time for LB ADLs     Vision Baseline Vision/History: 0 No visual deficits Vision Assessment?: No apparent visual deficits            Pertinent Vitals/Pain Pain Assessment Pain Assessment: Faces Faces Pain Scale: Hurts little more Pain Location: L knee Pain Descriptors / Indicators: Grimacing, Aching Pain Intervention(s): Monitored during session     Extremity/Trunk Assessment Upper Extremity Assessment Upper Extremity Assessment: Overall WFL for tasks assessed   Lower Extremity Assessment Lower Extremity Assessment: Defer to PT evaluation   Cervical / Trunk Assessment Cervical / Trunk Assessment: Normal   Communication Communication Communication: No apparent difficulties   Cognition Arousal: Alert Behavior During Therapy: WFL for tasks assessed/performed Cognition: No apparent impairments       Following commands: Intact       Cueing  General Comments   Cueing Techniques: Verbal cues;Visual cues  Demo'd tub shower transfer w/ BSC, pt verbalizing understanding           Home Living Family/patient expects to be discharged to:: Private residence Living Arrangements: Children;Other relatives Available Help at Discharge: Family;Available 24 hours/day Type of Home: House  Home Access: Stairs to enter Entrance Stairs-Number of Steps: 1   Home Layout: One level;Laundry or work area in basement;Able to live on main level with bedroom/bathroom     Bathroom Shower/Tub: Producer, television/film/video:  Administrator Accessibility: Yes   Home Equipment: Agricultural consultant (2 wheels);BSC/3in1          Prior Functioning/Environment Prior Level of Function : Independent/Modified Independent;Driving     Mobility Comments: ambulatory with RW since L TKA, prior to that was independent without AD, anticpating starting OPPT today ADLs Comments: Ind with ADLs, family assists prn with LB dressing    OT Problem List: Decreased strength;Decreased range of motion;Decreased activity tolerance;Impaired balance (sitting and/or standing);Decreased safety awareness;Decreased knowledge of use of DME or AE   OT Treatment/Interventions: Self-care/ADL training;Therapeutic exercise;Energy conservation;DME and/or AE instruction;Therapeutic activities;Patient/family education;Balance training      OT Goals(Current goals can be found in the care plan section)   Acute Rehab OT Goals Patient Stated Goal: To get better OT Goal Formulation: All assessment and education complete, DC therapy Time For Goal Achievement: 05/26/24 Potential to Achieve Goals: Good   OT Frequency:  Min 2X/week       AM-PAC OT 6 Clicks Daily Activity     Outcome Measure Help from another person eating meals?: None Help from another person taking care of personal grooming?: None Help from another person toileting, which includes using toliet, bedpan, or urinal?: None Help from another person bathing (including washing, rinsing, drying)?: None Help from another person to put on and taking off regular upper body clothing?: None Help from another person to put on and taking off regular lower body clothing?: None 6 Click Score: 24   End of Session Equipment Utilized During Treatment: Rolling walker (2 wheels) Nurse Communication: Mobility status  Activity Tolerance: Patient tolerated treatment well Patient left: in bed;with call bell/phone within reach  OT Visit Diagnosis: Unsteadiness on feet (R26.81);Other abnormalities  of gait and mobility (R26.89);Muscle weakness (generalized) (M62.81)                Time: 8461-8450 OT Time Calculation (min): 11 min Charges:  OT General Charges $OT Visit: 1 Visit OT Evaluation $OT Eval Moderate Complexity: 1 Mod  Jacob Adams, OT  Acute Rehabilitation Services Office [phone] Secure chat preferred   Jacob Adams Savers 05/12/2024, 4:01 PM

## 2024-05-12 NOTE — Plan of Care (Signed)
  Problem: Clinical Measurements: Goal: Ability to maintain clinical measurements within normal limits will improve Outcome: Progressing   Problem: Activity: Goal: Risk for activity intolerance will decrease Outcome: Progressing   Problem: Coping: Goal: Level of anxiety will decrease Outcome: Progressing   Problem: Pain Managment: Goal: General experience of comfort will improve and/or be controlled Outcome: Progressing

## 2024-05-12 NOTE — Discharge Instructions (Addendum)
 Dear Jacob Adams,   Thank you for letting us  participate in your care! In this section, you will find a brief hospital admission summary of why you were admitted to the hospital, what happened during your admission, your diagnosis/diagnoses, and recommended follow up.   You were seen for blood in your stool and had a scope done with GI that found some ulcers that were treated.  You also need to follow-up with GI to have a colonoscopy done for further evaluation outpatient.   POST-HOSPITAL & CARE INSTRUCTIONS We recommend following up with your PCP within 1 week from being discharged from the hospital. Please let PCP/Specialists know of any changes in medications that were made which you will be able to see in the medications section of this packet. Please also follow up GI as above  DOCTOR'S APPOINTMENTS & FOLLOW UP Future Appointments  Date Time Provider Department Center  05/17/2024  9:30 AM Jacob Adams [facility]  05/19/2024  1:15 PM Jacob Adams [facility]  05/20/2024 10:15 AM Jerri Kay HERO, MD [facility] None  05/24/2024 11:00 AM [place] Joneen CROME, PT [facility]  05/26/2024  2:15 PM [place] Joneen CROME, PT [facility]  05/31/2024 11:45 AM Jacob Adams, PT [facility]  06/03/2024 11:00 AM Jacob Adams [facility]  06/24/2024  9:20 AM Beather, Delon Gibson, PA [facility]     Thank you for choosing [facility]! Take care and be well!  Family Medicine Teaching Service Inpatient Team [place]  [facility]  [address] [place], [place] [address] [phone]    [facility] assistance programs. If you are behind on your bills and expenses, and need some help to make it through a short term hardship or financial emergency, there are several organizations and charities in the [place] area that may be able to help. They range from the [employer], [employer], Landscape architect  of [place]  and the local community action agency, the [employer], [employer]. These groups may be able to provide you resources to help pay your utility bills, rent, and they even offer housing assistance.  Crisis assistance program Find help for paying your rent, electric bills, free food, and even funds to pay your mortgage. The [employer] [phone]) offers several services to local families, as funding allows. The Emergency Assistance Program (EAP), which they administer, provides household goods, free food, clothing, and financial aid to people in need in the [place]  area. The EAP program does have some qualification, and counselors will interview clients for financial assistance by written referral only. Referrals need to be made by the Department of Social Services or by other EAP approved human services agencies or charities in the area.  Money for resources for emergency assistance are available for security deposits for rent, water, electric, and gas, past due rent, utility bills, past due mortgage payments, food, and clothing. The [employer] also operates a Programme researcher, broadcasting/film/video on the site. More [employer].  [employer] of [employer], which can be reached at ([phone], offers emergency assistance programs for those in need of help, such as food, rent assistance, a soup kitchen, shelter, and clothing. They are based in [facility]  but provide a number of services to those that qualify for assistance. Continue with [employer] programs.  [facility] Department of Social Services may be able  to offer temporary financial assistance and cash grants for paying rent and utilities. Help may be provided for local county residents who may be experiencing personal crisis when other resources, including government programs, are not available. Call [phone]  St. Jerrell everitt Mt  [employer]ociety, which is based in [place], provides financial assistance of up to $50.00 to help pay for rent, utilities, cooling bills, rent, and prescription medications. The program also provides secondhand furniture to those in need. [phone]  [employer] is a Geneticist, molecular. The organization can offer emergency assistance for paying rent, electric bills, utilities, food, household products and furniture. They offer extensive emergency and transitional housing for families, children and single women, and also run a Boy's and Dole Food. [address] [address], [employer], and other aid offered too. [address] [place], [place]  [address], [phone]  Additional locations of the [employer] are in [place] and other nearby communities. When you have an emergency, need free food, money for basic needs, or just need assistance around Christmas, then the [employer] may have the resources you need. Or they can refer you to nearby agencies. Learn more.  [place] Low Income Risk manager - This is offered for [facility] families. The federal government created [employer] Program provides a one-time cash grant payment to help eligible low-income families pay their electric and heating bills. [address] [place], [place]  [address], [phone]  Government and [employer] - The county administers several emergency and self-sufficiency programs. Residents of [place] can get help with energy bills and food, rent, and other expenses. In addition, work with a Sports coach who may be able to help you find a job or improve your employment skills. More [place] public assistance.  [place] Emergency Assistance - A program offers emergency utility and rent funds for greater [place] area residents. The program can also provide counseling and referrals to charities and government programs. Also  provides food and a free meal program that serves lunch Mondays - Saturdays and dinner seven days per week to individuals in the community. [address], [place], [place]  [address], [phone]  [employer] - Offers affordable apartment and housing communities across [place] and [place]. The low income and seniors can access public housing, rental assistance to qualified applicants, and apply for the section 8 rent subsidy program. Other programs include Chiropractor and Engineer, maintenance. [address] [place], [place]  [address], dial [phone].  Basic needs such as clothing - Low income families can receive free items (school supplies, clothes, holiday assistance, etc.) from clothing closets while more moderate income [address] families can shop at [employer]. Locations across the area help the needy. Get information on [place]  free clothing centers.  The [facility] provides transitional housing to veterans and the disabled. Clients will also access other services too, including life skills classes, case management, and assistance in finding permanent housing. [address] [place], [place]  [address], call [phone]  [facility] Transitional Housing in [place] is for people who were just evicted or that are formerly homeless. The non-profit will also help then gain self-sufficiency, find a home or apartment to live in, and also provides information on rent assistance when needed. Dial [phone]  [employer] Partnership to End Homelessness is available in [place]. Families that were evicted or that are homeless can gain  shelter, food, clothing, furniture, and also emergency financial assistance. Other services include financial skills and life skills coaching, job training, and case management. [address], [place], [place] [address].  Telephone [phone].  The [employer], [employer]. runs the [employer]. This can help people save money on their heating and summer cooling bills, and is free to low income families. Free upgrades can be made to your home. Phone [phone]  Many of the non-profits and programs mentioned above are all inclusive, meaning they can meet many needs of the low income, such as energy bills, food, rent, and more. However there are several organizations that focus just on rent and housing. Read more on rent assistance in [place] region.  Legal assistance for evictions, foreclosure, and more If you need free legal advice on civili issues, such as foreclosures, evictions, Electronics engineer, government programs, domestic issues and more, Armed forces operational officer Aid of [place]  [facility]) is a Associate Professor firm that provides free legal services and counsel to lower income people, seniors, disabled, and others. The goal is to ensure everyone has access to justice and fair representation.  Call them at [phone], or click here to learn more about [place]  free legal assistance programs.  [place] [employer]net and funds for emergency expenses The [employer] Stores is another organization that can provide people with [employer] and funds to pay bills. Their assistance depends on funding, and the demand for help is always very high. They can provide cash to help pay rent, a missed mortgage payment, or gas, electric, and water bills. But the assistance doesn't stop there. They also have a food pantry on site, which can provide food once every three (3) months to people who need help. The [employer] can also offer a Engineering geologist once every three (3) months for a maximum three (3) times. After receiving this voucher over that period of time, applicants can receive this aid one every six (6) months after that. [phone]) [phone].  Kohl's action agency The  [employer]el, [employer]. offers job and Dispensing optician. Resources are focused on helping students obtain the skills and experiences that are necessary to compete in today's challenging and tight job market. The non-profit faith-based community action agency offers internship trainings as well as classroom instruction. Economically disadvantaged and challenged individuals and potential employers can use their services. Classes are tailored to meet the needs of people in the [facility] region. [place], [place] [address], [phone]    Foreclosure prevention services Housing Counseling and Education is also offered by [employer] of the [place]. The agency (phone number is below) is a Engineer, structural providing foreclosure advice and counseling. They offer mortgage resolution counseling and also reverse mortgage counseling. Counselors can direct people to both [employer], as well as [place]  foreclosure assistance options.  Warehouse manager has locations in [place] and [place]. They run debt and foreclosure prevention programs for local families. A sampling of the programs offered include both Budget and Housing Counseling. This includes money management, financial advice, budget review and development of a written action plan with a Pensions consultant to help solve specific individual financial problems. In addition, housing and mortgage counselors can also provide pre- and post-purchase homeownership counseling, default resolution counseling (to prevent foreclosure) and reverse mortgage counseling. A Debt Management Program allows people and families with a high level of credit card or medical  debt to consolidate and repay consumer debt and loans to creditors and rebuild positive credit ratings and scores. [phone]  Debt assistance  programs Receive free counseling and debt help from [facility] of the [place]. The [facility] based agency can be reached at [phone]. The counselors provide free help, and the services include budget counseling. This will help people manage their expenses and set goals. They also offer a Forensic scientist, which will help individuals consolidate their debts and become debt free. Most of the workshops and services are free.  Community clinics in [place] Five of the leading health and dental centers are listed below. They may be able to provide medication, physicals, dental care, and general family care to residents of all incomes and backgrounds across the region. Some of the programs focus on the low income and underinsured. However if these clinics can't meet your needs, find information and details on more clinics in [place] .  Some of the options include [employer] of [employer]. This center provides free or low cost health care to low-income adults 18 - 64, who have no health insurance. Among other services offered include a pharmacy and eye clinic. Phone [phone]  [facility], which is located in [place], is a community clinic that provides primary medical and health care to uninsured and underinsured adults and families, as well as the low income, in the greater [place] area on a sliding-fee scale. Call [phone]  [place] Adult [facility] Program - They run a dental assistance program that is organized by [facility] to provide dental services and aid to [employer]. Services offered by the dental clinic are limited to extractions, pain management, and minor restorative care. [phone]  [place] [facility] has locations in [facility] and [place]. The community clinics provide complete pediatric care including primary health, mental health, social work, neurology, cardiology,  asthma. Dial [phone].  In addition to those [place] and [employer], find other free community clinics in [place]  and across the county.  Food pantry and assistance Some of the local food pantries and distribution centers to call for free food and groceries include The [facility]ive of [place] [facility] (phone [phone]), The [facility] (phone [phone]) and also [employer]. Dial [phone].  Several other food banks in the region provide clothing, free food and meals, access to soup kitchens and other help. Find the addresses and phone numbers of more food pantries in [place]. http://www.needhelppayingbills.com/html/[place]_assistance_pro.html

## 2024-05-12 NOTE — Assessment & Plan Note (Addendum)
 HR still elevated in the 100s. Suspect in setting of anemia vs chronic pain. - Continue to monitor

## 2024-05-12 NOTE — Assessment & Plan Note (Addendum)
 With alcohol. Last drink 7/23 @noon . Hx withdrawal with seizure. Last CIWA 0. - CIWAs q6h - Thiamine  100mg  PO daily, MVI 1 tab daily, folate 1mg  daily - Consider Naltrexone  at discharge (pt denied taking this outpatient)

## 2024-05-12 NOTE — Assessment & Plan Note (Addendum)
 Left total knee replacement on 7/18 with uncontrolled pain following despite opioid management. Now improved. - Pain management: Tylenol  1000mg  q8h, baclofen  10 mg three times daily, Oxycodone  5-10mg  q6h for breakthrough pain - Holding home robaxin  - PT/OT eval; will have PT specifically work on knee flexion - If patient's Hgb remains stable tomorrow 7/25, consider adding back Eliquis  for clot prevention

## 2024-05-13 ENCOUNTER — Encounter (HOSPITAL_COMMUNITY)
Admission: EM | Disposition: A | Discharge status: Home / Self Care | Payer: Self-pay | Source: Home / Self Care | Attending: Family Medicine

## 2024-05-13 ENCOUNTER — Telehealth: Payer: Self-pay

## 2024-05-13 ENCOUNTER — Inpatient Hospital Stay (HOSPITAL_COMMUNITY): Payer: MEDICAID | Admitting: Certified Registered Nurse Anesthetist

## 2024-05-13 ENCOUNTER — Encounter (HOSPITAL_COMMUNITY): Payer: Self-pay

## 2024-05-13 DIAGNOSIS — I5022 Chronic systolic (congestive) heart failure: Secondary | ICD-10-CM

## 2024-05-13 DIAGNOSIS — K922 Gastrointestinal hemorrhage, unspecified: Secondary | ICD-10-CM | POA: Diagnosis not present

## 2024-05-13 DIAGNOSIS — I110 Hypertensive heart disease with heart failure: Secondary | ICD-10-CM | POA: Diagnosis not present

## 2024-05-13 DIAGNOSIS — Z96652 Presence of left artificial knee joint: Secondary | ICD-10-CM | POA: Diagnosis not present

## 2024-05-13 DIAGNOSIS — K2211 Ulcer of esophagus with bleeding: Secondary | ICD-10-CM | POA: Diagnosis not present

## 2024-05-13 DIAGNOSIS — F17210 Nicotine dependence, cigarettes, uncomplicated: Secondary | ICD-10-CM

## 2024-05-13 HISTORY — PX: ESOPHAGOGASTRODUODENOSCOPY: SHX5428

## 2024-05-13 LAB — CBC
HCT: 26 % — ABNORMAL LOW (ref 39.0–52.0)
Hemoglobin: 8.9 g/dL — ABNORMAL LOW (ref 13.0–17.0)
MCH: 33.3 pg (ref 26.0–34.0)
MCHC: 34.2 g/dL (ref 30.0–36.0)
MCV: 97.4 fL (ref 80.0–100.0)
Platelets: 394 K/uL (ref 150–400)
RBC: 2.67 MIL/uL — ABNORMAL LOW (ref 4.22–5.81)
RDW: 14 % (ref 11.5–15.5)
WBC: 6.5 K/uL (ref 4.0–10.5)
nRBC: 0 % (ref 0.0–0.2)

## 2024-05-13 LAB — BASIC METABOLIC PANEL WITH GFR
Anion gap: 11 (ref 5–15)
BUN: 10 mg/dL (ref 6–20)
CO2: 21 mmol/L — ABNORMAL LOW (ref 22–32)
Calcium: 8.7 mg/dL — ABNORMAL LOW (ref 8.9–10.3)
Chloride: 105 mmol/L (ref 98–111)
Creatinine, Ser: 1.16 mg/dL (ref 0.61–1.24)
GFR, Estimated: >60 mL/min (ref >60)
Glucose, Bld: 120 mg/dL — ABNORMAL HIGH (ref 70–99)
Potassium: 3.9 mmol/L (ref 3.5–5.1)
Sodium: 137 mmol/L (ref 135–145)

## 2024-05-13 LAB — GLUCOSE, CAPILLARY
Glucose-Capillary: 108 mg/dL — ABNORMAL HIGH (ref 70–99)
Glucose-Capillary: 125 mg/dL — ABNORMAL HIGH (ref 70–99)
Glucose-Capillary: 137 mg/dL — ABNORMAL HIGH (ref 70–99)

## 2024-05-13 SURGERY — EGD (ESOPHAGOGASTRODUODENOSCOPY)
Anesthesia: Monitor Anesthesia Care

## 2024-05-13 MED ORDER — SACUBITRIL-VALSARTAN 49-51 MG PO TABS
1.0000 | ORAL_TABLET | Freq: Two times a day (BID) | ORAL | Status: DC
  Administered 2024-05-13 – 2024-05-15 (×5): 1 via ORAL
  Filled 2024-05-13 (×6): qty 1

## 2024-05-13 MED ORDER — OXYCODONE HCL 5 MG PO TABS
10.0000 mg | ORAL_TABLET | Freq: Four times a day (QID) | ORAL | Status: DC | PRN
  Administered 2024-05-13 – 2024-05-15 (×7): 10 mg via ORAL
  Filled 2024-05-13 (×8): qty 2

## 2024-05-13 MED ORDER — METOPROLOL SUCCINATE ER 25 MG PO TB24
50.0000 mg | ORAL_TABLET | Freq: Every day | ORAL | Status: DC
  Administered 2024-05-13 – 2024-05-15 (×3): 50 mg via ORAL
  Filled 2024-05-13 (×3): qty 2

## 2024-05-13 MED ORDER — SPIRONOLACTONE 25 MG PO TABS
25.0000 mg | ORAL_TABLET | Freq: Every day | ORAL | Status: DC
  Administered 2024-05-13 – 2024-05-15 (×3): 25 mg via ORAL
  Filled 2024-05-13 (×3): qty 1

## 2024-05-13 MED ORDER — PROPOFOL 10 MG/ML IV BOLUS
INTRAVENOUS | Status: DC | PRN
  Administered 2024-05-13: 40 mg via INTRAVENOUS
  Administered 2024-05-13 (×2): 20 mg via INTRAVENOUS

## 2024-05-13 MED ORDER — APIXABAN 2.5 MG PO TABS
2.5000 mg | ORAL_TABLET | Freq: Two times a day (BID) | ORAL | Status: DC
  Administered 2024-05-13 – 2024-05-15 (×4): 2.5 mg via ORAL
  Filled 2024-05-13 (×4): qty 1

## 2024-05-13 MED ORDER — HALOPERIDOL 0.5 MG PO TABS
0.5000 mg | ORAL_TABLET | Freq: Once | ORAL | Status: AC
  Administered 2024-05-13: 0.5 mg via ORAL
  Filled 2024-05-13: qty 1

## 2024-05-13 MED ORDER — HYDROCORTISONE 1 % EX CREA
TOPICAL_CREAM | Freq: Two times a day (BID) | CUTANEOUS | Status: DC | PRN
  Filled 2024-05-13: qty 28
  Filled 2024-05-13: qty 28.35
  Filled 2024-05-13: qty 28

## 2024-05-13 MED ORDER — PROPOFOL 500 MG/50ML IV EMUL
INTRAVENOUS | Status: DC | PRN
  Administered 2024-05-13: 125 ug/kg/min via INTRAVENOUS

## 2024-05-13 MED ORDER — FLUCONAZOLE 100 MG PO TABS
100.0000 mg | ORAL_TABLET | Freq: Every day | ORAL | Status: DC
  Administered 2024-05-13 – 2024-05-15 (×3): 100 mg via ORAL
  Filled 2024-05-13 (×4): qty 1

## 2024-05-13 MED ORDER — LIDOCAINE HCL (CARDIAC) PF 100 MG/5ML IV SOSY
PREFILLED_SYRINGE | INTRAVENOUS | Status: DC | PRN
  Administered 2024-05-13: 100 mg via INTRAVENOUS

## 2024-05-13 MED ORDER — BACLOFEN 10 MG PO TABS
20.0000 mg | ORAL_TABLET | Freq: Three times a day (TID) | ORAL | Status: DC | PRN
  Administered 2024-05-13 – 2024-05-14 (×4): 20 mg via ORAL
  Filled 2024-05-13 (×4): qty 2

## 2024-05-13 NOTE — Assessment & Plan Note (Addendum)
 Hgb of 8.9 from 9.5 yesterday, largely stable since admitted.  - GI consulted, appreciated recommendations: - DC IV Protonix  40 mg twice daily, start PO Protonix  40 mg daily - Start fluxonazole 100 mg daily for 3 weeks (7/25-8/15) given suspicion for candida - On carvedilol  6.25 mg twice daily, which helps with variceal prophylaxis; switching carvedilol  to metoprolol  today given he was taking this at home, and it still has some benefit for varices - Labs: AM CBC - Transfusion threshold < 7 - Continuous cardiac monitoring - Restarting Eliquis  for clot prevention

## 2024-05-13 NOTE — Assessment & Plan Note (Addendum)
 Hiccups present, improved overall. - Protonix  IV 40mg  BID as above - Baclofen  20 mg TID PRN; holding home robaxin  - Consider haldol  dose if hiccups reoccur/worsen

## 2024-05-13 NOTE — Telephone Encounter (Signed)
 In pt family medicine called today and the pt is in the hospital for unspecified reasons and told staff that he is still in a significant amount of pain from his knee. A staff member reached out and wanted for Dr. Jerri to be aware of this and wants him or Morna to reach out and provide education for pain management.

## 2024-05-13 NOTE — Progress Notes (Signed)
   Progress Note   Date: 05/13/2024  Patient Name: Jacob Adams        MRN#: [id number]  Eliquis  contributed to GI bleed

## 2024-05-13 NOTE — Assessment & Plan Note (Addendum)
 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 Was giving carvedilol  in hospital; will switch to metoprolol  succinate 50 mg daily as this is what patient was taking outpatient per med dispense report. Will restart home Entresto  49-51 mg twice daily, spironolactone  25 mg daily. Continue Farxiga  10 mg daily. HLD - Home Atorvastatin  80mg  daily Itching - Hydrocortisone  cream PRN

## 2024-05-13 NOTE — Progress Notes (Signed)
 Daily Progress Note Intern Pager: [phone]  Patient name: Jacob Adams Medical record number: [id number] Date of birth: 03-17-1966 Age: 58 y.o. Gender: male  Primary Care Provider: [facility] Consultants: GI (signed off) Code Status: FULL  Pt Overview and Major Events to Date:  - 7/23: Admitted, GI consulted  - 7/24: EGD, GI signed-off  Medical Decision Making:  Jacob Adams is a 58 y.o. male admitted for GI bleed and anemia. Suspect in setting of Eliquis  use post-op with history of hemorrhoids; could also have contribution from alcohol use disorder. Less concern for Ulcerative Colitis contribution despite history given quiescent UC as seen on colonoscopy in 04/2022.  EGD done today showed esophageal ulcers with no active bleeding and no signs of recent bleeding; biopsied. Also saw esophageal plaques suspicious for candidiasis, biopsied.  Colonoscopy not able to be completed today given inadequate prep, per GI planning for outpatient.  Pertinent PMH/PSH includes alcohol use disorder, ulcerative colitis, CHF, MDD, BPD, T2DM, OSA.  Assessment & Plan Acute lower GI bleeding Anemia Hgb of 8.9 from 9.5 yesterday, largely stable since admitted.  - GI consulted, appreciated recommendations: - DC IV Protonix  40 mg twice daily, start PO Protonix  40 mg daily - Start fluxonazole 100 mg daily for 3 weeks (7/25-8/15) given suspicion for candida - On carvedilol  6.25 mg twice daily, which helps with variceal prophylaxis; switching carvedilol  to metoprolol  today given he was taking this at home, and it still has some benefit for varices - Labs: AM CBC - Transfusion threshold < 7 - Continuous cardiac monitoring - Restarting Eliquis  for clot prevention Tachycardia HR intermittently elevated into the 100s. Suspect in setting of anemia vs chronic pain. 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  - Pain management: Tylenol  1000 mg q8h PRN, Baclofen  20 mg TID PRN, oxycodone  5-10 mg q4h PRN - Will continue Baclofen  for a week - Will return to q6h oxycodone  today, plan to discharge home with 2 days of oxycodone  - Will reach out to knee surgeon (Dr. Kay Cummins) to discuss pain management - No IV pain meds; holding home Robaxin  - PT/OT eval; will have PT specifically work on knee flexion - Given Hgb has been stable this admission, will resume Eliquis  2.5 mg twice daily for clot prevention GERD (gastroesophageal reflux disease) Hiccups present, improved overall. - Protonix  IV 40mg  BID as above - Baclofen  20 mg TID PRN; holding home robaxin  - Consider haldol  dose if hiccups reoccur/worsen Substance use disorder With alcohol. Last drink 7/23 @noon . Hx withdrawal with seizure. CIWA overnight of 8, given PO ativan , now CIWA most recently 1. DT window until noon 7/26 (72 hours after last drink). 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 Was giving carvedilol  in hospital; will switch to metoprolol  succinate 50 mg daily as this is what patient was taking outpatient per med dispense report. Will restart home Entresto  49-51 mg twice daily, spironolactone  25 mg daily. Continue Farxiga  10 mg daily. HLD - Home Atorvastatin  80mg  daily  Itching - Hydrocortisone  cream PRN   FEN/GI: Soft following EGD PPx: Resuming Eliquis  2.5 mg BID Dispo:Home tomorrow afternoon. Barriers include need for monitoring CIWAs for 72 hours.   Subjective:  Patient reports he is still having 10/10  pain in his left leg.  He was able to get up into the bathroom this morning with use of walker.  He reports bowel movement this morning was mostly clear.  Reports a little lightheadedness with moving but denies presyncope.  Reports he is still hiccuping some but this is improved from initially and he is able to sleep; there was no hiccuping when I talked with him.  He also reports itching.  Went over expectation setting for patient regarding pain management following total knee replacement, importance of continued PT.  Objective: Temp:  [98 F (36.7 C)-98.7 F (37.1 C)] 98.2 F (36.8 C) (07/25 0438) Pulse Rate:  [60-108] 108 (07/25 0438) Resp:  [14-18] 16 (07/25 0438) BP: (107-131)/(61-89) 131/86 (07/25 0438) SpO2:  [94 %-100 %] 98 % (07/25 0438) Physical Exam: General: Pt lying back in bed with head of bed raised, no acute distress.  Cardiovascular: Mildly tachycardic, regular rhythm. No murmurs/rubs/gallops. Respiratory: Normal work of breathing on room air. Clear to auscultation bilaterally; no wheezes, crackles. Abdomen: Bowel sounds present and normoactive bilaterally. Soft, nondistended, nontender. Extremities: Skin warm, dry. No bilateral lower extremity edema. Neuro: Alert and appropriately responding to questions.  Laboratory: Most recent CBC Lab Results  Component Value Date   WBC 6.5 05/13/2024   HGB 8.9 (L) 05/13/2024   HCT 26.0 (L) 05/13/2024   MCV 97.4 05/13/2024   PLT 394 05/13/2024   Most recent BMP    Latest Ref Rng & Units 05/13/2024    2:02 AM  BMP  Glucose 70 - 99 mg/dL 879   BUN 6 - 20 mg/dL 10   Creatinine 9.38 - 1.24 mg/dL 8.83   Sodium 864 - 854 mmol/L 137   Potassium 3.5 - 5.1 mmol/L 3.9   Chloride 98 - 111 mmol/L 105   CO2 22 - 32 mmol/L 21   Calcium  8.9 - 10.3 mg/dL 8.7     Larraine Palma, MD 05/13/2024, 7:10 AM  PGY-1, [facility] FPTS Intern pager: [phone], text pages welcome Secure chat group [facility]  Teaching Service

## 2024-05-13 NOTE — Plan of Care (Signed)
 Spoke with patient's wife per her request concerning confusion over patient discharging today.  Reassured wife that patient will not be discharging today, but that plan is to discharge tomorrow.  Wife raised concerns over pain and continued hiccups.  Informed wife that I had reached out to patient's orthopedic team concerning his continued pain for further recommendations.  Explained that we are unable to start him on IV medication patient as he cannot continue this outpatient.  Wife expressed concerns over inadequate pain control with oxycodone , requested additional medication for patient.  Informed wife I would talk to team about it, but our plan is to leave most of his pain control up to outpatient orthopedic team.  For concern over hiccups, will give one-time dose of Haldol  0.5 mg PO to help with symptoms.

## 2024-05-13 NOTE — Op Note (Addendum)
 [facility] Patient Name: Jacob Adams Procedure Date : 05/13/2024 MRN: [id number] Attending MD: Gustav ALONSO Mcgee , MD, [id number] Date of Birth: Oct 01, 1966 CSN: [id number] Age: 58 Admit Type: Inpatient Procedure:                Upper GI endoscopy Indications:              Surveillance procedure Providers:                Gustav ALONSO Mcgee, MD, Ozell Pouch, Felice Sar, Technician Referring MD:              Medicines:                Monitored Anesthesia Care Complications:            No immediate complications. Estimated Blood Loss:     Estimated blood loss was minimal. Procedure:                Pre-Anesthesia Assessment:                           - Prior to the procedure, a History and Physical                            was performed, and patient medications and                            allergies were reviewed. The patient's tolerance of                            previous anesthesia was also reviewed. The risks                            and benefits of the procedure and the sedation                            options and risks were discussed with the patient.                            All questions were answered, and informed consent                            was obtained. Prior Anticoagulants: The patient has                            taken no anticoagulant or antiplatelet agents. ASA                            Grade Assessment: III - A patient with severe                            systemic disease. After reviewing the risks and  benefits, the patient was deemed in satisfactory                            condition to undergo the procedure.                           After obtaining informed consent, the endoscope was                            passed under direct vision. Throughout the                            procedure, the patient's blood pressure, pulse, and                            oxygen  saturations were monitored continuously. The                            [id number]-H190 ([id number]) Olympus endoscope was introduced                            through the mouth, and advanced to the second part                            of duodenum. The upper GI endoscopy was                            accomplished without difficulty. The patient                            tolerated the procedure well. Scope In: Scope Out: Findings:      Few linear and superficial esophageal ulcers with no bleeding and no       stigmata of recent bleeding were found 30 to 40 cm from the incisors.       The largest lesion was 5 mm in largest dimension. Biopsies were taken       with a cold forceps for histology.      Patchy, yellow plaques were found in the entire esophagus. Biopsies were       taken with a cold forceps for histology.      The stomach was normal.      The cardia and gastric fundus were normal on retroflexion.      The examined duodenum was normal. Impression:               - Esophageal ulcers with no bleeding and no                            stigmata of recent bleeding. Biopsied. Likely                            etiology for GI blood loss /anemia                           - Esophageal plaques were found, suspicious for  candidiasis. Biopsied.                           - Normal stomach.                           - Normal examined duodenum. Recommendation:           - Resume previous diet.                           - Continue present medications.                           - Await pathology results.                           - Follow an antireflux regimen.                           - Use Protonix  (pantoprazole ) 40 mg PO BID.                           - Diflucan (fluconazole) 100 mg PO daily for 3                            weeks.                           - We will arrange outpatient GI follow up and                            surveillance colonoscopy                            - GI signing off, please call with any questions Procedure Code(s):        --- Professional ---                           [phone], Esophagogastroduodenoscopy, flexible,                            transoral; with biopsy, single or multiple Diagnosis Code(s):        --- Professional ---                           K22.10, Ulcer of esophagus without bleeding                           K22.9, Disease of esophagus, unspecified CPT copyright 2022 American Medical Association. All rights reserved. The codes documented in this report are preliminary and upon coder review may  be revised to meet current compliance requirements. Orian Figueira V. Ja Ohman, MD 05/13/2024 11:47:04 AM This report has been signed electronically. Number of Addenda: 0

## 2024-05-13 NOTE — Plan of Care (Signed)
  Problem: Activity: Goal: Risk for activity intolerance will decrease Outcome: Progressing   Problem: Coping: Goal: Level of anxiety will decrease Outcome: Progressing   Problem: Safety: Goal: Ability to remain free from injury will improve Outcome: Progressing   Problem: Skin Integrity: Goal: Risk for impaired skin integrity will decrease Outcome: Progressing   Problem: Pain Managment: Goal: General experience of comfort will improve and/or be controlled Outcome: Not Progressing

## 2024-05-13 NOTE — Assessment & Plan Note (Addendum)
 HR intermittently elevated into the 100s. Suspect in setting of anemia vs chronic pain. - No additional intervention indicated at this time, continue to monitor

## 2024-05-13 NOTE — Assessment & Plan Note (Addendum)
 With alcohol. Last drink 7/23 @noon . Hx withdrawal with seizure. CIWA overnight of 8, given PO ativan , now CIWA most recently 1. DT window until noon 7/26 (72 hours after last drink). - CIWAs q6h - Thiamine  100mg  PO daily, MVI 1 tab daily, folate 1mg  daily - Consider Naltrexone  at discharge (pt denied taking this outpatient)

## 2024-05-13 NOTE — Plan of Care (Signed)
   Problem: Activity: Goal: Risk for activity intolerance will decrease Outcome: Progressing

## 2024-05-13 NOTE — Anesthesia Preprocedure Evaluation (Signed)
 Anesthesia Evaluation  Patient identified by MRN, date of birth, ID band Patient awake    Reviewed: Allergy & Precautions, H&P , NPO status , Patient's Chart, lab work & pertinent test results  Airway Mallampati: II   Neck ROM: full    Dental   Pulmonary sleep apnea , Current Smoker and Patient abstained from smoking.   breath sounds clear to auscultation       Cardiovascular hypertension, +CHF   Rhythm:regular Rate:Normal     Neuro/Psych  Headaches PSYCHIATRIC DISORDERS Anxiety Depression     Neuromuscular disease    GI/Hepatic PUD,GERD  ,,  Endo/Other  diabetes, Type 2Hypothyroidism  Class 3 obesity  Renal/GU      Musculoskeletal  (+) Arthritis ,    Abdominal   Peds  Hematology   Anesthesia Other Findings   Reproductive/Obstetrics                              Anesthesia Physical Anesthesia Plan  ASA: 3  Anesthesia Plan: MAC   Post-op Pain Management:    Induction: Intravenous  PONV Risk Score and Plan: 1 and Propofol  infusion and Treatment may vary due to age or medical condition  Airway Management Planned: Nasal Cannula  Additional Equipment:   Intra-op Plan:   Post-operative Plan:   Informed Consent: I have reviewed the patients History and Physical, chart, labs and discussed the procedure including the risks, benefits and alternatives for the proposed anesthesia with the patient or authorized representative who has indicated his/her understanding and acceptance.     Dental advisory given  Plan Discussed with: CRNA, Anesthesiologist and Surgeon  Anesthesia Plan Comments:         Anesthesia Quick Evaluation

## 2024-05-13 NOTE — Transfer of Care (Signed)
 Immediate Anesthesia Transfer of Care Note  Patient: Jacob Adams  Procedure(s) Performed: EGD (ESOPHAGOGASTRODUODENOSCOPY)  Patient Location: Endoscopy Unit  Anesthesia Type:MAC  Level of Consciousness: drowsy and patient cooperative  Airway & Oxygen Therapy: Patient Spontanous Breathing and Patient connected to nasal cannula oxygen  Post-op Assessment: Report given to RN and Post -op Vital signs reviewed and stable  Post vital signs: Reviewed and stable  Last Vitals:  Vitals Value Taken Time  BP 135/87 1101  Temp    Pulse 109 05/13/24 11:01  Resp 15 05/13/24 11:01  SpO2 97 % 05/13/24 11:01  Vitals shown include unfiled device data.  Last Pain:  Vitals:   05/13/24 0955  TempSrc: Temporal  PainSc: 10-Worst pain ever         Complications: No notable events documented.

## 2024-05-13 NOTE — Interval H&P Note (Signed)
 History and Physical Interval Note:  05/13/2024 9:11 AM  Jacob Adams  has presented today for surgery, with the diagnosis of lower gastrointestinal bleeding, reported hematemesis and also anemia.  The various methods of treatment have been discussed with the patient and family. After consideration of risks, benefits and other options for treatment, the patient has consented to  Procedure(s): EGD (ESOPHAGOGASTRODUODENOSCOPY) (N/A) as a surgical intervention.  The patient's history has been reviewed, patient examined, no change in status, stable for surgery.  I have reviewed the patient's chart and labs.  Questions were answered to the patient's satisfaction.    Patient was unable to complete bowel prep, cannot proceed with colonoscopy this morning .   Leon Montoya

## 2024-05-13 NOTE — Assessment & Plan Note (Addendum)
 S/p left knee replacement on 7/18.  - Pain management: Tylenol  1000 mg q8h PRN, Baclofen  20 mg TID PRN, oxycodone  5-10 mg q4h PRN - Will continue Baclofen  for a week - Will return to q6h oxycodone  today, plan to discharge home with 2 days of oxycodone  - Will reach out to knee surgeon (Dr. Kay Cummins) to discuss pain management - No IV pain meds; holding home Robaxin  - PT/OT eval; will have PT specifically work on knee flexion - Given Hgb has been stable this admission, will resume Eliquis  2.5 mg twice daily for clot prevention

## 2024-05-13 NOTE — Progress Notes (Signed)
 PT Cancellation Note  Patient Details Name: LINDEN TAGLIAFERRO MRN: [id number] DOB: 11-08-1965   Cancelled Treatment:    Reason Eval/Treat Not Completed: (P) Patient at procedure or test/unavailable (Pt off unit for EGD will defer tx at this time.)   Keyvon Herter J Armond Cuthrell 05/13/2024, 10:48 AM  Toya HAMS , PTA Acute Rehabilitation Services Office [phone]

## 2024-05-14 ENCOUNTER — Encounter (HOSPITAL_COMMUNITY): Payer: Self-pay | Admitting: Gastroenterology

## 2024-05-14 ENCOUNTER — Other Ambulatory Visit (HOSPITAL_COMMUNITY): Payer: Self-pay

## 2024-05-14 DIAGNOSIS — Z96652 Presence of left artificial knee joint: Secondary | ICD-10-CM | POA: Diagnosis not present

## 2024-05-14 DIAGNOSIS — K922 Gastrointestinal hemorrhage, unspecified: Secondary | ICD-10-CM | POA: Diagnosis not present

## 2024-05-14 LAB — CBC
HCT: 25.7 % — ABNORMAL LOW (ref 39.0–52.0)
Hemoglobin: 8.7 g/dL — ABNORMAL LOW (ref 13.0–17.0)
MCH: 33.2 pg (ref 26.0–34.0)
MCHC: 33.9 g/dL (ref 30.0–36.0)
MCV: 98.1 fL (ref 80.0–100.0)
Platelets: 438 K/uL — ABNORMAL HIGH (ref 150–400)
RBC: 2.62 MIL/uL — ABNORMAL LOW (ref 4.22–5.81)
RDW: 14.5 % (ref 11.5–15.5)
WBC: 7.4 K/uL (ref 4.0–10.5)
nRBC: 0 % (ref 0.0–0.2)

## 2024-05-14 MED ORDER — HALOPERIDOL 1 MG PO TABS
1.0000 mg | ORAL_TABLET | Freq: Three times a day (TID) | ORAL | 0 refills | Status: DC | PRN
  Filled 2024-05-14: qty 90, 30d supply, fill #0

## 2024-05-14 MED ORDER — FLUCONAZOLE 100 MG PO TABS
100.0000 mg | ORAL_TABLET | Freq: Every day | ORAL | 0 refills | Status: AC
  Filled 2024-05-14: qty 19, 19d supply, fill #0

## 2024-05-14 MED ORDER — ACETAMINOPHEN 500 MG PO TABS: 1000.0000 mg | ORAL_TABLET | Freq: Three times a day (TID) | ORAL | Status: DC

## 2024-05-14 MED ORDER — HALOPERIDOL 1 MG PO TABS: 1.0000 mg | ORAL_TABLET | Freq: Three times a day (TID) | ORAL | Status: DC | PRN

## 2024-05-14 MED ORDER — PANTOPRAZOLE SODIUM 40 MG PO TBEC
40.0000 mg | DELAYED_RELEASE_TABLET | Freq: Two times a day (BID) | ORAL | 0 refills | Status: AC
  Filled 2024-05-14: qty 60, 30d supply, fill #0

## 2024-05-14 MED ORDER — OXYCODONE HCL 10 MG PO TABS
10.0000 mg | ORAL_TABLET | Freq: Four times a day (QID) | ORAL | 0 refills | Status: AC | PRN
  Filled 2024-05-14: qty 12, 3d supply, fill #0

## 2024-05-14 NOTE — Assessment & Plan Note (Addendum)
 Hgb of 8.9 from 9.5 yesterday, largely stable since admitted.  - Metoprolol  succinate 50 mg daily - Continuous cardiac monitoring - Restarted Eliquis  2.5 mg for post-op clot prevention given stable Hgb - Labs: AM CBC - Transfusion threshold < 7 - GI consulted, appreciated recommendations s/p EGD: - Pantoprazole  40 mg PO BID - Fluconazole  100 mg daily for 3 weeks (7/25-8/15) given suspicion for candidiasis on EGD - Outpatient colonoscopy

## 2024-05-14 NOTE — Plan of Care (Signed)
  Problem: Health Behavior/Discharge Planning: Goal: Ability to manage health-related needs will improve Outcome: Progressing   Problem: Clinical Measurements: Goal: Ability to maintain clinical measurements within normal limits will improve Outcome: Progressing Goal: Will remain free from infection Outcome: Progressing   Problem: Activity: Goal: Risk for activity intolerance will decrease Outcome: Progressing   Problem: Coping: Goal: Level of anxiety will decrease Outcome: Progressing   

## 2024-05-14 NOTE — Assessment & Plan Note (Addendum)
 Last drink 7/23 @ noon.  Hx withdrawal with seizure.  CIWAs 1>3>2. DT window until noon 7/26 (72 hours after last drink). - CIWAs Q6h - Thiamine  100 mg PO, MVI, folate daily - Patient declined cessation medications at discharge

## 2024-05-14 NOTE — Plan of Care (Signed)
   Problem: Education: Goal: Knowledge of General Education information will improve Description: Including pain rating scale, medication(s)/side effects and non-pharmacologic comfort measures Outcome: Progressing   Problem: Activity: Goal: Risk for activity intolerance will decrease Outcome: Progressing   Problem: Nutrition: Goal: Adequate nutrition will be maintained Outcome: Progressing

## 2024-05-14 NOTE — Assessment & Plan Note (Addendum)
 S/p left knee replacement on 7/18.  - Pain management: Tylenol  1000 mg q8h PRN, Baclofen  20 mg TID PRN, oxycodone  5-10 mg q4h PRN - Will continue Baclofen  for a week - Will return to q6h oxycodone  today, plan to discharge home with 3 days of oxycodone  until PM&R appointment 7/26 - PT/OT eval; will have PT specifically work on knee flexion; discharge with outpatient PT

## 2024-05-14 NOTE — Progress Notes (Addendum)
 Daily Progress Note Intern Pager: [phone]  Patient name: Jacob Adams Medical record number: [id number] Date of birth: 07-Jan-1966 Age: 58 y.o. Gender: male  Primary Care Provider: [facility] Consultants: GI (S/O) Code Status: FULL  Pt Overview and Major Events to Date: - 7/23: Admitted, GI consulted - 7/24: EGD with concern for candidiasis but no active bleeding, GI signed-off   Medical Decision Making Jacob Adams is 58 y.o. and presented with lower GI bleed and anemia, now s/p EGD with no major interventions and hemoglobin stable.  Will need colonoscopy in outpatient basis given inadequate inpatient prep.  Pertinent PMH/PSH includes ulcerative colitis, alcohol use disorder, CHF, MDD, BPD, T2DM, OSA.  Patient is now approaching discharge and agrees to discharge tomorrow when hiccups and GERD are well controlled and with outpatient follow up with PM&R on Tuesday.  Meds have been sent to [facility] today. Assessment & Plan Acute lower GI bleeding Anemia Hgb of 8.9 from 9.5 yesterday, largely stable since admitted.  - Metoprolol  succinate 50 mg daily - Continuous cardiac monitoring - Restarted Eliquis  2.5 mg for post-op clot prevention given stable Hgb - Labs: AM CBC - Transfusion threshold < 7 - GI consulted, appreciated recommendations s/p EGD: - Pantoprazole  40 mg PO BID - Fluconazole  100 mg daily for 3 weeks (7/25-8/15) given suspicion for candidiasis on EGD - Outpatient colonoscopy Pain due to total left knee replacement (HCC) S/p left knee replacement on 7/18.  - Pain management: Tylenol  1000 mg q8h PRN, Baclofen  20 mg TID PRN, oxycodone  5-10 mg q4h PRN - Will continue Baclofen  for a week - Will return to q6h oxycodone  today, plan to discharge home with 3 days of oxycodone  until PM&R appointment 7/26 - PT/OT eval; will have PT specifically work on knee flexion; discharge with outpatient PT GERD (gastroesophageal reflux disease) Hiccups present,  improved with haldol  x1. - Pantoprazole  IV 40 mg BID as above - Haldol  1 mg PO TID Alcohol use disorder Last drink 7/23 @ noon.  Hx withdrawal with seizure.  CIWAs 1>3>2. DT window until noon 7/26 (72 hours after last drink). - CIWAs Q6h - Thiamine  100 mg PO, MVI, folate daily - Patient declined cessation medications at discharge Tachycardia (Resolved: 05/14/2024) HR intermittently borderline elevated, but overall normalizing.  Suspect in setting of anemia vs chronic pain. Chronic health problem Hypothyroidism - Levothyroxine  88 mcg daily Bipolar disorder - Seroquel  200 mg daily at bedtime MDD - Home Prozac  20 mg daily T2DM - Well controlled w/ A1c 5.8, held home metformin, CBGs Q8h HTN  HFmrEF - Echo 12/2023 with LVEF 40-45%, Grade I diastolic dysfunction.  Now on home metoprolol  succinate 50 mg daily, Entresto  49-51 mg BID, spironolactone  25 mg daily, Farxiga  10 mg daily. HLD - Home Atorvastatin  80 mg daily Itching - Hydrocortisone  cream PRN  FEN/GI: Advance to regular diet PPx: Eliquis  2.5 mg BID Dispo: Home tomorrow. Barriers include pain control.  Subjective:  This AM, patient reports that he will be comfortable going home tomorrow with PMNR follow-up on Tuesday.  Requesting pain medicines until Tuesday.  States that hiccups improved with Haldol , requests further Haldol . Denies further hematochezia, rectal bleeding. Denies dyspnea, feeling well and able to ambulate.  Objective: Temp:  [97.7 F (36.5 C)-99.5 F (37.5 C)] 98 F (36.7 C) (07/26 0424) Pulse Rate:  [76-109] 99 (07/26 0424) Resp:  [12-18] 18 (07/26 0424) BP: (124-159)/(73-105) 124/78 (07/26 0424) SpO2:  [97 %-100 %] 100 % (07/26 0424) Weight:  [895.3  kg] 104.3 kg (07/25 0955)  Physical Exam: General: Age-appropriate, resting comfortably in bed, NAD, alert and at baseline. Cardiovascular: Regular rate and rhythm. Normal S1/S2. No murmurs, rubs, or gallops appreciated. 2+ radial pulses. Pulmonary: Clear  bilaterally to auscultation; no wheezes, crackles, or rhonchi. Normal WOB on room air, no accessory muscle usage. Abdominal: Normoactive bowel sounds, nondistended. No tenderness to deep or light palpation. No rebound or guarding. Skin: Warm and dry. Extremities: Right TKA surgical site with silver Hydrofiber dressing in place.  No peripheral edema bilaterally.  Capillary refill <2 seconds.  Laboratory: Most recent CBC Lab Results  Component Value Date   WBC 7.4 05/14/2024   HGB 8.7 (L) 05/14/2024   HCT 25.7 (L) 05/14/2024   MCV 98.1 05/14/2024   PLT 438 (H) 05/14/2024   Most recent BMP    Latest Ref Rng & Units 05/13/2024    2:02 AM  BMP  Glucose 70 - 99 mg/dL 879   BUN 6 - 20 mg/dL 10   Creatinine 9.38 - 1.24 mg/dL 8.83   Sodium 864 - 854 mmol/L 137   Potassium 3.5 - 5.1 mmol/L 3.9   Chloride 98 - 111 mmol/L 105   CO2 22 - 32 mmol/L 21   Calcium  8.9 - 10.3 mg/dL 8.7     Other pertinent labs: -None  New Imaging/Diagnostic Tests: -None  Dennies Coate, MD 05/14/2024, 7:27 AM  PGY-2, [facility] FPTS Intern pager: [phone], text pages welcome Secure chat group [facility] Teaching Service

## 2024-05-14 NOTE — Assessment & Plan Note (Addendum)
 Hiccups present, improved with haldol  x1. - Pantoprazole  IV 40 mg BID as above - Haldol  1 mg PO TID

## 2024-05-14 NOTE — Assessment & Plan Note (Signed)
 HR intermittently borderline elevated, but overall normalizing.  Suspect in setting of anemia vs chronic pain.

## 2024-05-14 NOTE — Assessment & Plan Note (Addendum)
 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  Now on home metoprolol  succinate 50 mg daily, Entresto  49-51 mg BID, spironolactone  25 mg daily, Farxiga  10 mg daily. HLD - Home Atorvastatin  80 mg daily Itching - Hydrocortisone  cream PRN

## 2024-05-15 DIAGNOSIS — Z96652 Presence of left artificial knee joint: Secondary | ICD-10-CM | POA: Diagnosis not present

## 2024-05-15 DIAGNOSIS — F1090 Alcohol use, unspecified, uncomplicated: Secondary | ICD-10-CM

## 2024-05-15 DIAGNOSIS — K922 Gastrointestinal hemorrhage, unspecified: Secondary | ICD-10-CM | POA: Diagnosis not present

## 2024-05-15 LAB — CBC
HCT: 27.1 % — ABNORMAL LOW (ref 39.0–52.0)
Hemoglobin: 9 g/dL — ABNORMAL LOW (ref 13.0–17.0)
MCH: 32.7 pg (ref 26.0–34.0)
MCHC: 33.2 g/dL (ref 30.0–36.0)
MCV: 98.5 fL (ref 80.0–100.0)
Platelets: 517 K/uL — ABNORMAL HIGH (ref 150–400)
RBC: 2.75 MIL/uL — ABNORMAL LOW (ref 4.22–5.81)
RDW: 14.7 % (ref 11.5–15.5)
WBC: 7.9 K/uL (ref 4.0–10.5)
nRBC: 0 % (ref 0.0–0.2)

## 2024-05-15 NOTE — Plan of Care (Signed)

## 2024-05-15 NOTE — Progress Notes (Signed)
 Jacob Adams to be D/C'd  per MD order.  Discussed with the patient and all questions fully answered.  VSS, Skin clean, dry and intact without evidence of skin break down, no evidence of skin tears noted.  IV catheter discontinued intact. Site without signs and symptoms of complications. Dressing and pressure applied.  An After Visit Summary was printed and given to the patient. Patient received TOC prescriptions.  D/c education completed with patient/family including follow up instructions, medication list, d/c activities limitations if indicated, with other d/c instructions as indicated by MD - patient able to verbalize understanding, all questions fully answered.   Patient instructed to return to ED, call 911, or call MD for any changes in condition.   Patient to be escorted via WC, and D/C home via private auto.

## 2024-05-15 NOTE — Discharge Summary (Addendum)
 Family Medicine Teaching [facility] Discharge Summary  Patient name: Jacob Adams Medical record number: [id number] Date of birth: [birth year] Age: 58 y.o. 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 year old with a history of alcohol use disorder, ulcerative colitis CHF, MDD, BPD, T2DM, OSA who presented with bloody stools and was admitted to the [facility] Medicine Teaching Service for anemia and GI bleed.  Anemia GI Bleed Reported bloody stools since starting Eliquis  after total left knee replacement on 05/06/24. Started on IV Protonix  40 mg twice daily and GI recommended EGD and colonoscopy. EGD showed nonbleeding esophageal ulcers without sign of recent bleeding, biopsied; also with esophageal plaques concerning for candidiasis and biopsied. Switched from IV Protonix  to PO Protonix  40 mg daily and started on diflucan  100 mg daily for 3 weeks (7/25-8/15) given plaques. Colonoscopy unable to be completed given inadequate prep, GI planning to do outpatient.  Hemoglobin remained stable 8 to 9 throughout admission.  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 Pain managed inpatient with Tylenol  1000mg  q8h scheduled, baclofen  20 mg three times daily PRN, Oxycodone  10mg  q6h PRN for breakthrough pain. Home robaxin  held. PT/OT worked with patient specifically on knee flexion.  Outpatient PMNR appointment on Tues 7/29 to evaluate further.  GERD Patient initially with frequent hiccupping leaving him short of breath. Trialed Protonix  and Baclofen  without relief of symptoms therefore given Haloperidol  0.5 mg once with improvement.  Other chronic conditions were medically managed with home medications and formulary alternatives as necessary (substance (alcohol) use disorder, hypothyroidism, BPD, MDD, T2DM, HTN, HLD).  PCP Follow-up Recommendations: PMNR appointment made for Tues 7/29 given continued pain of Left knee Patient to follow-up with GI outpatient for surveillance colonoscopy Follow esophageal biopsies (ulcer and plaque) Diflucan  through 8/15 for potential candidiasis of esophageus  Consider resuming metformin Continue to work on alcohol cessation Patient attending outpatient PT at [facility]; ensure compliance    Results/Tests Pending at Time of Discharge:  - EGD biopsies surgical pathology   Disposition: Home  Discharge Condition: Stable  Discharge Exam:  Vitals:   05/15/24 0541 05/15/24 0746  BP: (!) 136/93 134/70  Pulse: 97 (!) 46  Resp: 18 16  Temp: 98.2 F (36.8 C) 98.6 F (37 C)  SpO2: 100% 96%   Physical Exam: General: Pt in bed with head of bed elevated. No acute distress. Cardiovascular: Regular rate and rhythm, no murmurs/rubs/gallops. Respiratory: Normal work of breathing on room air. Clear to auscultation bilaterally; no wheezes, crackles. Abdomen: Bowel sounds present and normoactive bilaterally. Soft, nondistended, nontender. Extremities: Skin warm, dry. No bilateral lower extremity edema. Bandage over Left knee, no active draining/bleeding. Neuro: Alert and appropriately responding to questions.  Significant Procedures:  EGD 7/25:  - Esophageal ulcers with no bleeding and no stigmata of recent bleeding. Biopsied. Likely etiology for GI blood loss /anemia - Esophageal plaques were found, suspicious for candidiasis.  Biopsied. - Normal  stomach. - Normal examined duodenum.  Significant Labs and Imaging:  Recent Labs  Lab 05/14/24 0451 05/15/24 0709  WBC 7.4 7.9  HGB 8.7* 9.0*  HCT 25.7* 27.1*  PLT 438* 517*   No results for input(s): NA, K, CL, CO2, GLUCOSE, BUN, CREATININE, CALCIUM , MG, PHOS, ALKPHOS, AST, ALT, ALBUMIN, PROTEIN in the last 48 hours.  Xray Left Knee 7/23: Left knee arthroplasty. Moderate joint effusion. No acute osseous abnormality.  CT PE 7/23: No PE.   Discharge Medications:  Allergies as of 05/15/2024       Reactions   Nsaids Swelling, Rash, Other (See Comments)   Stomach pain    Penicillins Itching, Swelling, Rash   Tolerated cefazolin  05/05/24   [name] Other (See Comments)   Does not eat pork or pork by-products, personal preference   Sunscreens Swelling, Other (See Comments)   Skin peels        Medication List     STOP taking these medications    butalbital -acetaminophen -caffeine  50-325-40 MG tablet Commonly known as: FIORICET    carvedilol  6.25 MG tablet Commonly known as: COREG    Emgality  120 MG/ML Soaj Generic drug: Galcanezumab -gnlm       TAKE these medications    acetaminophen  500 MG tablet Commonly known as: TYLENOL  Take 2 tablets (1,000 mg total) by mouth every 8 (eight) hours.   albuterol  108 (90 Base) MCG/ACT inhaler Commonly known as: VENTOLIN  HFA Inhale 2 puffs into the lungs every 6 (six) hours as needed for wheezing or shortness of breath.   alprostadil 40 MCG injection Commonly known as: EDEX 40 mcg by Intracavitary route 4 days.   atorvastatin  80 MG tablet Commonly known as: LIPITOR Take 1 tablet (80 mg total) by mouth at bedtime. What changed: when to take this   CALTRATE 600+D3 PO Take 1 tablet by mouth in the morning.   CertaVite/Antioxidants Tabs Take 1 tablet by mouth daily. What changed: when to take this   docusate sodium  100 MG capsule Commonly known as:  Colace Take 1 capsule (100 mg total) by mouth daily as needed. What changed: reasons to take this   DULoxetine  30 MG capsule Commonly known as: CYMBALTA  Take 1 capsule (30mg ) by mouth twice daily. What changed:  how much to take how to take this when to take this additional instructions   Eliquis  2.5 MG Tabs tablet Generic drug: apixaban  Take 1 tablet by mouth twice daily for 30 days after surgery to prevent blood clots   Entresto  49-51 MG Generic drug: sacubitril -valsartan  Take 1 tablet by mouth 2 (two) times daily.   Farxiga  10 MG Tabs tablet Generic drug: dapagliflozin  propanediol Take 10 mg by mouth in the morning.   fluconazole  100 MG tablet Commonly known as: DIFLUCAN  Take 1 tablet (100 mg total) by mouth daily for 19 days.   folic acid  1 MG tablet Commonly known as: FOLVITE  Take 1 tablet (1 mg total) by mouth daily. What changed: when to take this   haloperidol  1 MG tablet Commonly known as: HALDOL  Take 1 tablet (1 mg total) by mouth every 8 (eight) hours as needed (Hiccups). Stop when hiccups stop.   hyoscyamine  0.125 MG SL tablet Commonly known as: Levsin /SL Place 1 tablet (0.125 mg total) under the tongue every 4 (four) hours as needed.   latanoprost  0.005 % ophthalmic solution Commonly known as: XALATAN  Place 1 drop into both eyes at bedtime.   levothyroxine  88 MCG tablet Commonly known as: SYNTHROID  Take 88 mcg by mouth daily before breakfast.  metFORMIN 500 MG tablet Commonly known as: GLUCOPHAGE Take 500 mg by mouth in the morning.   methocarbamol  750 MG tablet Commonly known as: Robaxin -750 Take 1 tablet (750 mg total) by mouth 3 (three) times daily as needed for muscle spasms.   metoprolol  succinate 50 MG 24 hr tablet Commonly known as: TOPROL -XL Take 50 mg by mouth daily. Take with or immediately following a meal.   naltrexone  50 MG tablet Commonly known as: DEPADE Take 50 mg by mouth in the morning.   ondansetron  4 MG  tablet Commonly known as: Zofran  Take 1 tablet (4 mg total) by mouth every 8 (eight) hours as needed for nausea or vomiting.   Oxycodone  HCl 10 MG Tabs Take 1 tablet (10 mg total) by mouth every 6 (six) hours as needed for up to 3 days for severe pain (pain score 7-10) or moderate pain (pain score 4-6).   pantoprazole  40 MG tablet Commonly known as: Protonix  Take 1 tablet (40 mg total) by mouth 2 (two) times daily. What changed: when to take this   polyethylene glycol powder 17 GM/SCOOP powder Commonly known as: GLYCOLAX /MIRALAX  Take 17 g by mouth daily as needed for mild constipation.   prazosin  5 MG capsule Commonly known as: MINIPRESS  Take 5 mg by mouth at bedtime.   QUEtiapine  200 MG tablet Commonly known as: SEROQUEL  Take 200 mg by mouth at bedtime.   spironolactone  25 MG tablet Commonly known as: ALDACTONE  Take 1 tablet (25 mg total) by mouth daily.   sucralfate  1 g tablet Commonly known as: Carafate  Take 1 tablet (1 g total) by mouth 4 (four) times daily -  with meals and at bedtime.   thiamine  100 MG tablet Commonly known as: VITAMIN B1 Take 1 tablet (100 mg total) by mouth daily. What changed: when to take this   topiramate  50 MG tablet Commonly known as: TOPAMAX  Take 50 mg by mouth 2 (two) times daily.   Ubrelvy  50 MG Tabs Generic drug: Ubrogepant  Take 50 mg by mouth daily as needed (for migraines).        Discharge Instructions: Please refer to Patient Instructions section of EMR for full details.  Patient was counseled important signs and symptoms that should prompt return to medical care, changes in medications, dietary instructions, activity restrictions, and follow up appointments.   Follow-Up Appointments:  Follow-up Information     [facility] Follow up.   Specialty: Rehabilitation Contact information: [address] [place]  [address] [phone]        [facility]. Schedule an appointment as soon as possible for a visit in 1 week(s).   Specialty: Pediatrics Contact information: [address] [place] [address] [phone]                 Larraine Palma, MD 05/15/2024, 12:27 PM PGY-1, [facility]  I have verified that the resident's  findings are accurately documented in the resident's note. I have made edits and changes where appropriate, and agree with plan.  Ozell Provencal, MD, PGY-3 [facility] 12:45 PM 05/15/2024

## 2024-05-16 NOTE — Anesthesia Postprocedure Evaluation (Signed)
 Anesthesia Post Note  Patient: Jacob Adams  Procedure(s) Performed: EGD (ESOPHAGOGASTRODUODENOSCOPY)     Patient location during evaluation: Endoscopy Anesthesia Type: MAC Level of consciousness: awake and alert Pain management: pain level controlled Vital Signs Assessment: post-procedure vital signs reviewed and stable Respiratory status: spontaneous breathing, nonlabored ventilation, respiratory function stable and patient connected to nasal cannula oxygen Cardiovascular status: stable and blood pressure returned to baseline Postop Assessment: no apparent nausea or vomiting Anesthetic complications: no   No notable events documented.  Last Vitals:  Vitals:   05/15/24 0541 05/15/24 0746  BP: (!) 136/93 134/70  Pulse: 97 (!) 46  Resp: 18 16  Temp: 36.8 C 37 C  SpO2: 100% 96%    Last Pain:  Vitals:   05/15/24 1532  TempSrc:   PainSc: 10-Worst pain ever                 Meggen Spaziani S

## 2024-05-16 NOTE — Telephone Encounter (Signed)
 FYI - Called [facility] number, was transferred to the family medicine department. Stated this patient was discharged yesterday.

## 2024-05-17 ENCOUNTER — Ambulatory Visit: Payer: MEDICAID | Attending: Physician Assistant

## 2024-05-17 DIAGNOSIS — R2681 Unsteadiness on feet: Secondary | ICD-10-CM | POA: Diagnosis present

## 2024-05-17 DIAGNOSIS — M6281 Muscle weakness (generalized): Secondary | ICD-10-CM | POA: Diagnosis present

## 2024-05-17 DIAGNOSIS — G8929 Other chronic pain: Secondary | ICD-10-CM | POA: Diagnosis present

## 2024-05-17 DIAGNOSIS — T8484XA Pain due to internal orthopedic prosthetic devices, implants and grafts, initial encounter: Secondary | ICD-10-CM | POA: Diagnosis not present

## 2024-05-17 DIAGNOSIS — Z96652 Presence of left artificial knee joint: Secondary | ICD-10-CM | POA: Insufficient documentation

## 2024-05-17 DIAGNOSIS — M25562 Pain in left knee: Secondary | ICD-10-CM | POA: Insufficient documentation

## 2024-05-17 LAB — SURGICAL PATHOLOGY
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
## 2024-05-18 ENCOUNTER — Encounter (HOSPITAL_COMMUNITY): Payer: Self-pay | Admitting: Orthopaedic Surgery

## 2024-05-19 ENCOUNTER — Ambulatory Visit: Payer: MEDICAID

## 2024-05-19 DIAGNOSIS — M6281 Muscle weakness (generalized): Secondary | ICD-10-CM

## 2024-05-19 DIAGNOSIS — G8929 Other chronic pain: Secondary | ICD-10-CM

## 2024-05-19 DIAGNOSIS — M25562 Pain in left knee: Secondary | ICD-10-CM | POA: Diagnosis not present

## 2024-05-19 DIAGNOSIS — R2681 Unsteadiness on feet: Secondary | ICD-10-CM
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
## 2024-05-20 ENCOUNTER — Ambulatory Visit: Admitting: Physician Assistant

## 2024-05-20 DIAGNOSIS — Z96652 Presence of left artificial knee joint: Secondary | ICD-10-CM

## 2024-05-20 MED ORDER — METHOCARBAMOL 750 MG PO TABS: 750.0000 mg | ORAL_TABLET | Freq: Three times a day (TID) | ORAL | 2 refills | Status: DC | PRN

## 2024-05-20 MED ORDER — OXYCODONE-ACETAMINOPHEN 5-325 MG PO TABS: 1.0000 | ORAL_TABLET | Freq: Three times a day (TID) | ORAL | 0 refills | Status: DC | PRN

## 2024-05-20 MED ORDER — DOCUSATE SODIUM 100 MG PO CAPS: 100.0000 mg | ORAL_CAPSULE | Freq: Every day | ORAL | 2 refills | Status: AC | PRN
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
## 2024-05-23 ENCOUNTER — Encounter (HOSPITAL_COMMUNITY): Payer: Self-pay | Admitting: Orthopaedic Surgery

## 2024-05-24 ENCOUNTER — Ambulatory Visit: Payer: MEDICAID | Admitting: Physical Therapy

## 2024-05-24 ENCOUNTER — Encounter (HOSPITAL_COMMUNITY): Payer: Self-pay | Admitting: Orthopaedic Surgery
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
## 2024-05-25 ENCOUNTER — Ambulatory Visit: Payer: MEDICAID

## 2024-05-26 ENCOUNTER — Telehealth: Payer: Self-pay | Admitting: Physical Therapy

## 2024-05-26 ENCOUNTER — Ambulatory Visit: Payer: MEDICAID | Attending: Physician Assistant | Admitting: Physical Therapy
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
## 2024-05-26 NOTE — Telephone Encounter (Signed)
 Spoke with Jacob Adams regarding today's missed appointment which he reported something came up and he couldn't make it. I referenced today counts as a no show and patients are allotted 3 before they are discharged.  Referenced the handout in his folder, and reviewed his next scheduled appointment which he stated he will be at.  Jacob Adams PT, DPT, LAT, ATC  05/26/24  3:54 PM

## 2024-05-31 ENCOUNTER — Telehealth: Payer: Self-pay

## 2024-05-31 ENCOUNTER — Ambulatory Visit: Payer: MEDICAID

## 2024-05-31 NOTE — Telephone Encounter (Signed)
 PT called and spoke with patient, his ride did not arrive until 11:50 so he had his wife try to call and cancel appointment. He wants to reschedule this visit if possible and will call back. This is technically his 2nd no show, PT will leave visits until we can see if he can come back as he is high priority post op TKA.   Alm Jacob Adams   05/31/24 2:17 PM

## 2024-06-01 ENCOUNTER — Other Ambulatory Visit (INDEPENDENT_AMBULATORY_CARE_PROVIDER_SITE_OTHER): Payer: MEDICAID

## 2024-06-01 ENCOUNTER — Ambulatory Visit (INDEPENDENT_AMBULATORY_CARE_PROVIDER_SITE_OTHER): Payer: MEDICAID | Admitting: Orthopedic Surgery

## 2024-06-01 DIAGNOSIS — M545 Low back pain, unspecified: Secondary | ICD-10-CM | POA: Diagnosis not present

## 2024-06-02 ENCOUNTER — Encounter: Payer: Self-pay | Admitting: Orthopedic Surgery
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
## 2024-06-03 ENCOUNTER — Ambulatory Visit: Payer: MEDICAID
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
## 2024-06-08 ENCOUNTER — Ambulatory Visit: Payer: MEDICAID

## 2024-06-17 ENCOUNTER — Other Ambulatory Visit (INDEPENDENT_AMBULATORY_CARE_PROVIDER_SITE_OTHER): Payer: MEDICAID

## 2024-06-17 ENCOUNTER — Ambulatory Visit (INDEPENDENT_AMBULATORY_CARE_PROVIDER_SITE_OTHER): Payer: MEDICAID | Admitting: Physician Assistant

## 2024-06-17 DIAGNOSIS — Z96652 Presence of left artificial knee joint: Secondary | ICD-10-CM | POA: Diagnosis not present
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
## 2024-06-20 ENCOUNTER — Inpatient Hospital Stay (HOSPITAL_COMMUNITY)
Admission: EM | Admit: 2024-06-20 | Discharge: 2024-06-24 | DRG: 394 | Disposition: A | Discharge status: Home / Self Care

## 2024-06-20 ENCOUNTER — Emergency Department (HOSPITAL_COMMUNITY)

## 2024-06-20 ENCOUNTER — Other Ambulatory Visit: Payer: Self-pay

## 2024-06-20 ENCOUNTER — Encounter (HOSPITAL_COMMUNITY): Payer: Self-pay | Admitting: Emergency Medicine

## 2024-06-20 DIAGNOSIS — K635 Polyp of colon: Secondary | ICD-10-CM

## 2024-06-20 DIAGNOSIS — E119 Type 2 diabetes mellitus without complications: Secondary | ICD-10-CM | POA: Diagnosis present

## 2024-06-20 DIAGNOSIS — G4733 Obstructive sleep apnea (adult) (pediatric): Secondary | ICD-10-CM | POA: Diagnosis present

## 2024-06-20 DIAGNOSIS — Z9152 Personal history of nonsuicidal self-harm: Secondary | ICD-10-CM

## 2024-06-20 DIAGNOSIS — G621 Alcoholic polyneuropathy: Secondary | ICD-10-CM | POA: Diagnosis present

## 2024-06-20 DIAGNOSIS — N179 Acute kidney failure, unspecified: Secondary | ICD-10-CM | POA: Diagnosis present

## 2024-06-20 DIAGNOSIS — Z880 Allergy status to penicillin: Secondary | ICD-10-CM

## 2024-06-20 DIAGNOSIS — F1020 Alcohol dependence, uncomplicated: Secondary | ICD-10-CM | POA: Diagnosis present

## 2024-06-20 DIAGNOSIS — Z5948 Other specified lack of adequate food: Secondary | ICD-10-CM

## 2024-06-20 DIAGNOSIS — Z79890 Hormone replacement therapy (postmenopausal): Secondary | ICD-10-CM

## 2024-06-20 DIAGNOSIS — K5100 Ulcerative (chronic) pancolitis without complications: Secondary | ICD-10-CM | POA: Diagnosis present

## 2024-06-20 DIAGNOSIS — I10 Essential (primary) hypertension: Secondary | ICD-10-CM | POA: Diagnosis present

## 2024-06-20 DIAGNOSIS — Z91148 Patient's other noncompliance with medication regimen for other reason: Secondary | ICD-10-CM | POA: Diagnosis not present

## 2024-06-20 DIAGNOSIS — M1712 Unilateral primary osteoarthritis, left knee: Secondary | ICD-10-CM | POA: Diagnosis present

## 2024-06-20 DIAGNOSIS — T383X6A Underdosing of insulin and oral hypoglycemic [antidiabetic] drugs, initial encounter: Secondary | ICD-10-CM | POA: Diagnosis present

## 2024-06-20 DIAGNOSIS — K3189 Other diseases of stomach and duodenum: Secondary | ICD-10-CM | POA: Diagnosis present

## 2024-06-20 DIAGNOSIS — E872 Acidosis, unspecified: Secondary | ICD-10-CM | POA: Diagnosis present

## 2024-06-20 DIAGNOSIS — Z811 Family history of alcohol abuse and dependence: Secondary | ICD-10-CM

## 2024-06-20 DIAGNOSIS — Z860102 Personal history of hyperplastic colon polyps: Secondary | ICD-10-CM

## 2024-06-20 DIAGNOSIS — F319 Bipolar disorder, unspecified: Secondary | ICD-10-CM | POA: Diagnosis present

## 2024-06-20 DIAGNOSIS — Z889 Allergy status to unspecified drugs, medicaments and biological substances status: Secondary | ICD-10-CM

## 2024-06-20 DIAGNOSIS — K7030 Alcoholic cirrhosis of liver without ascites: Secondary | ICD-10-CM | POA: Diagnosis present

## 2024-06-20 DIAGNOSIS — Z5941 Food insecurity: Secondary | ICD-10-CM

## 2024-06-20 DIAGNOSIS — F17200 Nicotine dependence, unspecified, uncomplicated: Secondary | ICD-10-CM | POA: Diagnosis present

## 2024-06-20 DIAGNOSIS — Z7984 Long term (current) use of oral hypoglycemic drugs: Secondary | ICD-10-CM

## 2024-06-20 DIAGNOSIS — Z91014 Allergy to mammalian meats: Secondary | ICD-10-CM

## 2024-06-20 DIAGNOSIS — K648 Other hemorrhoids: Principal | ICD-10-CM | POA: Diagnosis present

## 2024-06-20 DIAGNOSIS — Z560 Unemployment, unspecified: Secondary | ICD-10-CM | POA: Diagnosis not present

## 2024-06-20 DIAGNOSIS — F17210 Nicotine dependence, cigarettes, uncomplicated: Secondary | ICD-10-CM | POA: Diagnosis present

## 2024-06-20 DIAGNOSIS — K922 Gastrointestinal hemorrhage, unspecified: Secondary | ICD-10-CM | POA: Diagnosis present

## 2024-06-20 DIAGNOSIS — K644 Residual hemorrhoidal skin tags: Secondary | ICD-10-CM | POA: Diagnosis present

## 2024-06-20 DIAGNOSIS — K5190 Ulcerative colitis, unspecified, without complications: Secondary | ICD-10-CM | POA: Diagnosis not present

## 2024-06-20 DIAGNOSIS — K51911 Ulcerative colitis, unspecified with rectal bleeding: Secondary | ICD-10-CM | POA: Diagnosis not present

## 2024-06-20 DIAGNOSIS — E66811 Obesity, class 1: Secondary | ICD-10-CM | POA: Diagnosis present

## 2024-06-20 DIAGNOSIS — Y92009 Unspecified place in unspecified non-institutional (private) residence as the place of occurrence of the external cause: Secondary | ICD-10-CM | POA: Diagnosis not present

## 2024-06-20 DIAGNOSIS — D649 Anemia, unspecified: Secondary | ICD-10-CM | POA: Diagnosis present

## 2024-06-20 DIAGNOSIS — I110 Hypertensive heart disease with heart failure: Secondary | ICD-10-CM | POA: Diagnosis present

## 2024-06-20 DIAGNOSIS — E039 Hypothyroidism, unspecified: Secondary | ICD-10-CM | POA: Diagnosis present

## 2024-06-20 DIAGNOSIS — I5022 Chronic systolic (congestive) heart failure: Secondary | ICD-10-CM | POA: Diagnosis present

## 2024-06-20 DIAGNOSIS — D123 Benign neoplasm of transverse colon: Secondary | ICD-10-CM | POA: Diagnosis present

## 2024-06-20 DIAGNOSIS — I5020 Unspecified systolic (congestive) heart failure: Secondary | ICD-10-CM | POA: Diagnosis not present

## 2024-06-20 DIAGNOSIS — F419 Anxiety disorder, unspecified: Secondary | ICD-10-CM | POA: Diagnosis present

## 2024-06-20 DIAGNOSIS — Z635 Disruption of family by separation and divorce: Secondary | ICD-10-CM

## 2024-06-20 DIAGNOSIS — F209 Schizophrenia, unspecified: Secondary | ICD-10-CM | POA: Diagnosis present

## 2024-06-20 DIAGNOSIS — Z800 Family history of malignant neoplasm of digestive organs: Secondary | ICD-10-CM

## 2024-06-20 DIAGNOSIS — F10239 Alcohol dependence with withdrawal, unspecified: Secondary | ICD-10-CM | POA: Diagnosis present

## 2024-06-20 DIAGNOSIS — F1090 Alcohol use, unspecified, uncomplicated: Principal | ICD-10-CM

## 2024-06-20 DIAGNOSIS — Z8719 Personal history of other diseases of the digestive system: Secondary | ICD-10-CM

## 2024-06-20 DIAGNOSIS — Z96652 Presence of left artificial knee joint: Secondary | ICD-10-CM | POA: Diagnosis present

## 2024-06-20 DIAGNOSIS — R1084 Generalized abdominal pain: Secondary | ICD-10-CM

## 2024-06-20 DIAGNOSIS — K920 Hematemesis: Secondary | ICD-10-CM | POA: Diagnosis not present

## 2024-06-20 DIAGNOSIS — Z8249 Family history of ischemic heart disease and other diseases of the circulatory system: Secondary | ICD-10-CM | POA: Diagnosis not present

## 2024-06-20 DIAGNOSIS — Z79899 Other long term (current) drug therapy: Secondary | ICD-10-CM

## 2024-06-20 DIAGNOSIS — Z7901 Long term (current) use of anticoagulants: Secondary | ICD-10-CM | POA: Diagnosis not present

## 2024-06-20 DIAGNOSIS — F4310 Post-traumatic stress disorder, unspecified: Secondary | ICD-10-CM | POA: Diagnosis present

## 2024-06-20 DIAGNOSIS — Z8711 Personal history of peptic ulcer disease: Secondary | ICD-10-CM

## 2024-06-20 DIAGNOSIS — Z91128 Patient's intentional underdosing of medication regimen for other reason: Secondary | ICD-10-CM

## 2024-06-20 DIAGNOSIS — K921 Melena: Secondary | ICD-10-CM

## 2024-06-20 DIAGNOSIS — Z6833 Body mass index (BMI) 33.0-33.9, adult: Secondary | ICD-10-CM

## 2024-06-20 DIAGNOSIS — K649 Unspecified hemorrhoids: Secondary | ICD-10-CM

## 2024-06-20 DIAGNOSIS — Z9151 Personal history of suicidal behavior: Secondary | ICD-10-CM

## 2024-06-20 DIAGNOSIS — F333 Major depressive disorder, recurrent, severe with psychotic symptoms: Secondary | ICD-10-CM | POA: Diagnosis present

## 2024-06-20 LAB — COMPREHENSIVE METABOLIC PANEL WITH GFR
ALT: 15 U/L (ref 0–44)
AST: 32 U/L (ref 15–41)
Albumin: 4.1 g/dL (ref 3.5–5.0)
Alkaline Phosphatase: 88 U/L (ref 38–126)
Anion gap: 16 — ABNORMAL HIGH (ref 5–15)
BUN: 6 mg/dL (ref 6–20)
CO2: 20 mmol/L — ABNORMAL LOW (ref 22–32)
Calcium: 8.9 mg/dL (ref 8.9–10.3)
Chloride: 98 mmol/L (ref 98–111)
Creatinine, Ser: 0.84 mg/dL (ref 0.61–1.24)
GFR, Estimated: >60 mL/min (ref >60)
Glucose, Bld: 92 mg/dL (ref 70–99)
Potassium: 4.1 mmol/L (ref 3.5–5.1)
Sodium: 134 mmol/L — ABNORMAL LOW (ref 135–145)
Total Bilirubin: 1 mg/dL (ref 0.0–1.2)
Total Protein: 8.2 g/dL — ABNORMAL HIGH (ref 6.5–8.1)

## 2024-06-20 LAB — I-STAT CHEM 8, ED
BUN: 6 mg/dL (ref 6–20)
Calcium, Ion: 1.01 mmol/L — ABNORMAL LOW (ref 1.15–1.40)
Chloride: 103 mmol/L (ref 98–111)
Creatinine, Ser: 0.8 mg/dL (ref 0.61–1.24)
Glucose, Bld: 90 mg/dL (ref 70–99)
HCT: 38 % — ABNORMAL LOW (ref 39.0–52.0)
Hemoglobin: 12.9 g/dL — ABNORMAL LOW (ref 13.0–17.0)
Potassium: 3.8 mmol/L (ref 3.5–5.1)
Sodium: 135 mmol/L (ref 135–145)
TCO2: 19 mmol/L — ABNORMAL LOW (ref 22–32)

## 2024-06-20 LAB — URINALYSIS, ROUTINE W REFLEX MICROSCOPIC
Bacteria, UA: NONE SEEN
Bilirubin Urine: NEGATIVE
Glucose, UA: NEGATIVE mg/dL
Hgb urine dipstick: NEGATIVE
Ketones, ur: 5 mg/dL — AB
Leukocytes,Ua: NEGATIVE
Nitrite: NEGATIVE
Protein, ur: 100 mg/dL — AB
Specific Gravity, Urine: 1.005 (ref 1.005–1.030)
pH: 7 (ref 5.0–8.0)

## 2024-06-20 LAB — CBC WITH DIFFERENTIAL/PLATELET
Abs Immature Granulocytes: 0.02 K/uL (ref 0.00–0.07)
Basophils Absolute: 0.1 K/uL (ref 0.0–0.1)
Basophils Relative: 2 %
Eosinophils Absolute: 0 K/uL (ref 0.0–0.5)
Eosinophils Relative: 0 %
HCT: 35.6 % — ABNORMAL LOW (ref 39.0–52.0)
Hemoglobin: 12.2 g/dL — ABNORMAL LOW (ref 13.0–17.0)
Immature Granulocytes: 1 %
Lymphocytes Relative: 23 %
Lymphs Abs: 0.8 K/uL (ref 0.7–4.0)
MCH: 33.3 pg (ref 26.0–34.0)
MCHC: 34.3 g/dL (ref 30.0–36.0)
MCV: 97.3 fL (ref 80.0–100.0)
Monocytes Absolute: 0.4 K/uL (ref 0.1–1.0)
Monocytes Relative: 12 %
Neutro Abs: 2.4 K/uL (ref 1.7–7.7)
Neutrophils Relative %: 62 %
Platelets: 343 K/uL (ref 150–400)
RBC: 3.66 MIL/uL — ABNORMAL LOW (ref 4.22–5.81)
RDW: 13.8 % (ref 11.5–15.5)
WBC: 3.7 K/uL — ABNORMAL LOW (ref 4.0–10.5)
nRBC: 0 % (ref 0.0–0.2)

## 2024-06-20 LAB — RAPID URINE DRUG SCREEN, HOSP PERFORMED
Amphetamines: NOT DETECTED
Barbiturates: NOT DETECTED
Benzodiazepines: NOT DETECTED
Cocaine: NOT DETECTED
Opiates: NOT DETECTED
Tetrahydrocannabinol: NOT DETECTED

## 2024-06-20 LAB — GLUCOSE, CAPILLARY: Glucose-Capillary: 161 mg/dL — ABNORMAL HIGH (ref 70–99)

## 2024-06-20 LAB — PROTIME-INR
INR: 1 (ref 0.8–1.2)
Prothrombin Time: 13.5 s (ref 11.4–15.2)

## 2024-06-20 LAB — PHOSPHORUS: Phosphorus: 2.7 mg/dL (ref 2.5–4.6)

## 2024-06-20 LAB — AMMONIA: Ammonia: <13 umol/L (ref 9–35)

## 2024-06-20 LAB — ETHANOL: Alcohol, Ethyl (B): <15 mg/dL (ref <15)

## 2024-06-20 LAB — MAGNESIUM: Magnesium: 1.7 mg/dL (ref 1.7–2.4)

## 2024-06-20 LAB — TYPE AND SCREEN
ABO/RH(D): O POS
Antibody Screen: NEGATIVE

## 2024-06-20 LAB — LACTATE DEHYDROGENASE: LDH: 220 U/L — ABNORMAL HIGH (ref 98–192)

## 2024-06-20 MED ORDER — PANTOPRAZOLE SODIUM 40 MG PO TBEC
40.0000 mg | DELAYED_RELEASE_TABLET | Freq: Two times a day (BID) | ORAL | Status: DC
  Administered 2024-06-20 – 2024-06-24 (×8): 40 mg via ORAL
  Filled 2024-06-20 (×8): qty 1

## 2024-06-20 MED ORDER — LORAZEPAM 1 MG PO TABS
1.0000 mg | ORAL_TABLET | ORAL | Status: DC | PRN
  Administered 2024-06-20 – 2024-06-21 (×6): 1 mg via ORAL
  Filled 2024-06-20 (×6): qty 1

## 2024-06-20 MED ORDER — ATORVASTATIN CALCIUM 80 MG PO TABS
80.0000 mg | ORAL_TABLET | Freq: Every morning | ORAL | Status: DC
  Administered 2024-06-21 – 2024-06-24 (×4): 80 mg via ORAL
  Filled 2024-06-20 (×4): qty 1

## 2024-06-20 MED ORDER — ACETAMINOPHEN 500 MG PO TABS
1000.0000 mg | ORAL_TABLET | Freq: Three times a day (TID) | ORAL | Status: DC | PRN
  Administered 2024-06-20 – 2024-06-22 (×4): 1000 mg via ORAL
  Filled 2024-06-20 (×4): qty 2

## 2024-06-20 MED ORDER — LORAZEPAM 1 MG PO TABS: 0.0000 mg | ORAL_TABLET | Freq: Two times a day (BID) | ORAL | Status: DC

## 2024-06-20 MED ORDER — LORAZEPAM 1 MG PO TABS
2.0000 mg | ORAL_TABLET | Freq: Once | ORAL | Status: AC
  Administered 2024-06-20: 2 mg via ORAL
  Filled 2024-06-20: qty 2

## 2024-06-20 MED ORDER — LEVOTHYROXINE SODIUM 88 MCG PO TABS
88.0000 ug | ORAL_TABLET | Freq: Every day | ORAL | Status: DC
  Administered 2024-06-21 – 2024-06-24 (×4): 88 ug via ORAL
  Filled 2024-06-20 (×4): qty 1

## 2024-06-20 MED ORDER — ACETAMINOPHEN 325 MG PO TABS: 650.0000 mg | ORAL_TABLET | Freq: Four times a day (QID) | ORAL | Status: DC | PRN

## 2024-06-20 MED ORDER — NICOTINE 14 MG/24HR TD PT24
14.0000 mg | MEDICATED_PATCH | Freq: Every day | TRANSDERMAL | Status: DC | PRN
  Administered 2024-06-22: 14 mg via TRANSDERMAL
  Filled 2024-06-20: qty 1

## 2024-06-20 MED ORDER — QUETIAPINE FUMARATE 100 MG PO TABS
400.0000 mg | ORAL_TABLET | Freq: Every day | ORAL | Status: DC
  Administered 2024-06-20 – 2024-06-21 (×2): 400 mg via ORAL
  Filled 2024-06-20 (×2): qty 4

## 2024-06-20 MED ORDER — THIAMINE MONONITRATE 100 MG PO TABS
100.0000 mg | ORAL_TABLET | Freq: Every day | ORAL | Status: DC
  Administered 2024-06-20 – 2024-06-24 (×5): 100 mg via ORAL
  Filled 2024-06-20 (×5): qty 1

## 2024-06-20 MED ORDER — FOLIC ACID 1 MG PO TABS
1.0000 mg | ORAL_TABLET | Freq: Every day | ORAL | Status: DC
  Administered 2024-06-20 – 2024-06-24 (×5): 1 mg via ORAL
  Filled 2024-06-20 (×5): qty 1

## 2024-06-20 MED ORDER — THIAMINE HCL 100 MG/ML IJ SOLN
100.0000 mg | Freq: Every day | INTRAMUSCULAR | Status: DC
  Filled 2024-06-20 (×2): qty 2

## 2024-06-20 MED ORDER — NALTREXONE HCL 50 MG PO TABS: 50.0000 mg | ORAL_TABLET | Freq: Every morning | ORAL | Status: DC

## 2024-06-20 MED ORDER — DAPAGLIFLOZIN PROPANEDIOL 10 MG PO TABS
10.0000 mg | ORAL_TABLET | Freq: Every morning | ORAL | Status: DC
  Administered 2024-06-21 – 2024-06-24 (×4): 10 mg via ORAL
  Filled 2024-06-20 (×4): qty 1

## 2024-06-20 MED ORDER — SACUBITRIL-VALSARTAN 49-51 MG PO TABS
1.0000 | ORAL_TABLET | Freq: Two times a day (BID) | ORAL | Status: DC
  Administered 2024-06-20 – 2024-06-24 (×7): 1 via ORAL
  Filled 2024-06-20 (×11): qty 1

## 2024-06-20 MED ORDER — IOHEXOL 350 MG/ML SOLN
75.0000 mL | Freq: Once | INTRAVENOUS | Status: AC | PRN
Start: 2024-06-20 — End: 2024-06-20
  Administered 2024-06-20: 75 mL via INTRAVENOUS

## 2024-06-20 MED ORDER — ADULT MULTIVITAMIN W/MINERALS CH
1.0000 | ORAL_TABLET | Freq: Every day | ORAL | Status: DC
  Administered 2024-06-20 – 2024-06-24 (×5): 1 via ORAL
  Filled 2024-06-20 (×5): qty 1

## 2024-06-20 MED ORDER — LORAZEPAM 2 MG/ML IJ SOLN: 1.0000 mg | INTRAMUSCULAR | Status: DC | PRN

## 2024-06-20 MED ORDER — LACTATED RINGERS IV BOLUS
1000.0000 mL | Freq: Once | INTRAVENOUS | Status: AC
  Administered 2024-06-20: 1000 mL via INTRAVENOUS

## 2024-06-20 MED ORDER — LORAZEPAM 1 MG PO TABS
0.0000 mg | ORAL_TABLET | Freq: Four times a day (QID) | ORAL | Status: DC
  Administered 2024-06-20: 2 mg via ORAL
  Filled 2024-06-20: qty 2

## 2024-06-20 MED ORDER — NALTREXONE HCL 50 MG PO TABS
50.0000 mg | ORAL_TABLET | Freq: Every morning | ORAL | Status: DC
Start: 2024-06-21 — End: 2024-06-24
  Administered 2024-06-21 – 2024-06-24 (×4): 50 mg via ORAL
  Filled 2024-06-20 (×4): qty 1

## 2024-06-20 NOTE — Progress Notes (Signed)
 Patient received from the ED. Alert and oriented. On RA. Call bell within the reach. Patient is in the bed.

## 2024-06-20 NOTE — Progress Notes (Signed)
 SCD placed

## 2024-06-20 NOTE — ED Notes (Signed)
Report  given to Jada RN ?

## 2024-06-20 NOTE — Plan of Care (Signed)
  Problem: Education: Goal: Knowledge of General Education information will improve Description: Including pain rating scale, medication(s)/side effects and non-pharmacologic comfort measures Outcome: Progressing   Problem: Health Behavior/Discharge Planning: Goal: Ability to manage health-related needs will improve Outcome: Progressing   Problem: Clinical Measurements: Goal: Respiratory complications will improve Outcome: Progressing   Problem: Clinical Measurements: Goal: Cardiovascular complication will be avoided Outcome: Progressing   Problem: Coping: Goal: Level of anxiety will decrease Outcome: Progressing   Problem: Elimination: Goal: Will not experience complications related to bowel motility Outcome: Progressing   Problem: Elimination: Goal: Will not experience complications related to urinary retention Outcome: Progressing   Problem: Safety: Goal: Ability to remain free from injury will improve Outcome: Progressing   Problem: Skin Integrity: Goal: Risk for impaired skin integrity will decrease Outcome: Progressing
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
## 2024-06-20 NOTE — ED Notes (Signed)
 Pt refused SCDs

## 2024-06-20 NOTE — ED Triage Notes (Signed)
 Pt here from home with c/o abd pain n/v and rectal bleed times 2 weeks , pt also state that he has been drinking etoh , last drink this morning

## 2024-06-20 NOTE — ED Notes (Signed)
Pt and all belongings transported upstairs.  

## 2024-06-20 NOTE — Hospital Course (Addendum)
 Principal Problem:   Alcohol use disorder, severe, dependence (HCC) Active Problems:   Lower GI bleed   MDD (major depressive disorder), recurrent, severe, [name]   Chronic systolic CHF (congestive heart failure) (HCC)   Tobacco dependence   Hypothyroidism   Hypertension   Alcoholic cirrhosis of liver without ascites (HCC)   Portal hypertensive gastropathy (HCC)   Anemia  Resolved Problems:   * No resolved hospital problems. *  Consults:***  Procedures:***  Follow-up items:***  58 year old male with severe alcohol use disorder, major depression with psychotic features, cirrhosis, history of mild LV systolic dysfunction presents for monitored alcohol withdrawal.  Principal Problem:   Alcohol use disorder, severe, dependence (HCC) Chronic, severe.  Last drink was this morning.  Little bit tremulous, borderline tachycardia on exam.  Start CIWA protocol with symptom triggered Ativan  for early mild withdrawal.  He is motivated to quit drinking.  Naltrexone  would be safe in this person with child Pugh class A cirrhosis and no hepatitis.  Admit to MedSurg.  Active Problems:   Lower GI bleed Stable at present.  An episode of bright red blood in the toilet on Saturday but no bowel movement since then.  Rectal exam shows no gross blood.  Hemoglobin is improved since last admission for GI bleeding.  EGD then showed nonbleeding esophageal ulcers.  He is not having brisk bleeding.  Suspect lower GI bleed from prior hemorrhoids or IBD flare.  He has history of mild IBD.  Check fecal calprotectin and CRP.  Trend hemoglobin.  Deferring GI consultation.    MDD (major depressive disorder), recurrent, severe, [name] Chronic and severe.  Somewhat decompensated at present with visual and auditory hallucinations.  No desire for suicide or self-harm.  Taking Seroquel  outside of the hospital for this which we will restart nightly.  We are going to asked psychiatry to see him for  his decompensated depression with psychotic features and help with a reasonable discharge medication regimen.    Chronic systolic CHF (congestive heart failure) (HCC) Chronic, stable.  Well compensated, well-perfused, euvolemic.  Poor medication adherence, several different prescribers.  For now can restart his Entresto  as he is able to get this from the [place] and he is hypertensive on my initial exam.    Tobacco dependence Counseled about quitting.  Nicotine  patches as needed for cigarette cravings.    Hypothyroidism Chronic. Check TSH. Resume outpatient Synthroid .    Hypertension BP 160/100 on admission.  No symptoms.  Resume outpatient Entresto .    Alcoholic cirrhosis of liver without ascites (HCC) Chronic and stable.  With mild portal hypertension gastropathy by EGD in 2023.  No history of ascites.  No hepatitis.  Child-Pugh class A.    Anemia Normocytic.  Improved since hospitalization for GI bleeding in July 2025.  Check iron, TIBC, ferritin.   Last week-- Thursday--nausea with vomiting Diarrhea--with blood since last hospital discharge.  Endoscopy last admission showed ulcers per pt Noncompliant with medications--seroquel  1-1.5 at night for sleep assistance.  2 beers this AM Last BM last night Felt jittery, with intractable n/v, came to hospital with wife for withdrawal Fungal path negative for fungal infection  Rectal exam--no blood present.  #AUD #Acute withdrawal -withdrawal, has in the past -Hallucinations worsen--will drink more to alleviate -Seizure in the past  #Intractable n/v -Last emesis this AM without blood.   #Auditory hallucinations #visual hallucinations #Schizophrenia -consult psych   #T2DM -Farxiga     #Cirrhosis  #Ulcerative colitis Dx by [facility] Not on medical management

## 2024-06-20 NOTE — ED Notes (Signed)
 MD at bedside for exam

## 2024-06-20 NOTE — ED Provider Notes (Signed)
 [place] EMERGENCY DEPARTMENT AT [facility] Provider Note   CSN: [id number] Arrival date & time: 06/20/24  9067     Patient presents with: Abdominal Pain   Jacob Adams is a 58 y.o. male with history of depression, cyst of hernia, hypothyroidism, congestive heart failure, ulcerative colitis, migraines, alcohol use disorder presents emerged from today with wife for multiple complaints.  Patient requesting medical detox for alcohol.  He has had seizures before from alcohol withdrawal and is required medical admission.  He also reports has been having feelings of doom and feels that he is seeing demons which is associate with his schizophrenia.  Also reports he feels like his depressions been worsening.  He reports he last had a drink a few hours prior to arrival.  He smokes 1.5 packs/day.  Wife reports that he is having intermittent confusion over the past 6 weeks however nothing more recent.  She does not believe he is confused now.  He also complains of central abdominal pain but is more diffuse for greater than a month.  Has had hematochezia as well with this.  Does have history of ulcerative colitis.   Abdominal Pain Associated symptoms: no chest pain, no chills, no constipation, no diarrhea, no dysuria, no fever, no hematuria, no nausea, no shortness of breath and no vomiting        Prior to Admission medications   Medication Sig Start Date End Date Taking? Authorizing Provider  apixaban  (ELIQUIS ) 2.5 MG TABS tablet Take 1 tablet by mouth twice daily for 30 days after surgery to prevent blood clots Patient not taking: Reported on 06/20/2024 05/05/24   Jule Ronal CROME, PA-C  atorvastatin  (LIPITOR) 80 MG tablet Take 1 tablet (80 mg total) by mouth at bedtime. Patient taking differently: Take 80 mg by mouth in the morning. 01/06/24  Yes Victoria Ruts, MD  carvedilol  (COREG ) 6.25 MG tablet Take 6.25 mg by mouth 2 (two) times daily. 06/01/24  Yes [provider]   docusate sodium  (COLACE) 100 MG capsule Take 1 capsule (100 mg total) by mouth daily as needed. 05/20/24 05/20/25 Yes Jule Ronal CROME, PA-C  DULoxetine  (CYMBALTA ) 30 MG capsule Take 1 capsule (30mg ) by mouth twice daily. Patient taking differently: Take 30 mg by mouth 2 (two) times daily. 03/13/24  Yes Zheng, Michael, DO  folic acid  (FOLVITE ) 1 MG tablet Take 1 tablet (1 mg total) by mouth daily. Patient taking differently: Take 1 mg by mouth in the morning. 01/18/24  Yes Howell Lunger, DO  haloperidol  (HALDOL ) 1 MG tablet Take 1 tablet (1 mg total) by mouth every 8 (eight) hours as needed (Hiccups). Stop when hiccups stop. 05/14/24  Yes Shitarev, Dimitry, MD  levothyroxine  (SYNTHROID ) 88 MCG tablet Take 88 mcg by mouth daily before breakfast.   Yes [provider]  metFORMIN (GLUCOPHAGE) 500 MG tablet Take 500 mg by mouth in the morning.   Yes [provider]  methocarbamol  (ROBAXIN -750) 750 MG tablet Take 1 tablet (750 mg total) by mouth 3 (three) times daily as needed for muscle spasms. 05/20/24  Yes Jule Ronal CROME, PA-C  metoprolol  succinate (TOPROL -XL) 50 MG 24 hr tablet Take 50 mg by mouth daily. Take with or immediately following a meal.   Yes [provider]  ondansetron  (ZOFRAN ) 4 MG tablet Take 1 tablet (4 mg total) by mouth every 8 (eight) hours as needed for nausea or vomiting. 02/18/24  Yes Jule Ronal CROME, PA-C  oxyCODONE -acetaminophen  (PERCOCET) 5-325 MG tablet Take 1-2 tablets  by mouth every 8 (eight) hours as needed. 05/20/24  Yes Jule Ronal CROME, PA-C  pantoprazole  (PROTONIX ) 40 MG tablet Take 1 tablet (40 mg total) by mouth 2 (two) times daily. 05/14/24  Yes Shitarev, Dimitry, MD  QUEtiapine  (SEROQUEL ) 200 MG tablet Take 200 mg by mouth at bedtime.   Yes [provider]  sacubitril -valsartan  (ENTRESTO ) 49-51 MG Take 1 tablet by mouth 2 (two) times daily. 01/07/24  Yes Victoria Ruts, MD  spironolactone  (ALDACTONE ) 25 MG tablet Take 1 tablet (25 mg  total) by mouth daily. Patient taking differently: Take 25 mg by mouth in the morning. 01/07/24  Yes Victoria Ruts, MD  topiramate  (TOPAMAX ) 50 MG tablet Take 50 mg by mouth 2 (two) times daily.   Yes [provider]  acetaminophen  (TYLENOL ) 500 MG tablet Take 2 tablets (1,000 mg total) by mouth every 8 (eight) hours. 05/14/24   Shitarev, Dimitry, MD  albuterol  (VENTOLIN  HFA) 108 (90 Base) MCG/ACT inhaler Inhale 2 puffs into the lungs every 6 (six) hours as needed for wheezing or shortness of breath.    [provider]  alprostadil (EDEX) 40 MCG injection 40 mcg by Intracavitary route 4 days.    [provider]  Calcium  Carb-Cholecalciferol  (CALTRATE 600+D3 PO) Take 1 tablet by mouth in the morning.    [provider]  FARXIGA  10 MG TABS tablet Take 10 mg by mouth in the morning. Patient not taking: Reported on 05/12/2024 11/24/23   [provider]  hyoscyamine  (LEVSIN AMIEL) 0.125 MG SL tablet Place 1 tablet (0.125 mg total) under the tongue every 4 (four) hours as needed. Patient not taking: Reported on 04/26/2024 03/16/24   Haze Lonni PARAS, MD  latanoprost  (XALATAN ) 0.005 % ophthalmic solution Place 1 drop into both eyes at bedtime.    [provider]  Multiple Vitamin (MULTIVITAMIN WITH MINERALS) TABS tablet Take 1 tablet by mouth daily. Patient taking differently: Take 1 tablet by mouth daily with breakfast. 01/18/24   Howell Lunger, DO  naltrexone  (DEPADE) 50 MG tablet Take 50 mg by mouth in the morning. Patient not taking: Reported on 05/02/2024    [provider]  polyethylene glycol powder (GLYCOLAX /MIRALAX ) 17 GM/SCOOP powder Take 17 g by mouth daily as needed for mild constipation. 01/18/24   Howell Lunger, DO  prazosin  (MINIPRESS ) 5 MG capsule Take 5 mg by mouth at bedtime.    [provider]  sucralfate  (CARAFATE ) 1 g tablet Take 1 tablet (1 g total) by mouth 4 (four) times daily -  with meals and at  bedtime. Patient not taking: Reported on 04/26/2024 03/16/24   Haze Lonni PARAS, MD  thiamine  (VITAMIN B1) 100 MG tablet Take 1 tablet (100 mg total) by mouth daily. Patient taking differently: Take 100 mg by mouth in the morning. 01/18/24   Howell Lunger, DO  Ubrogepant  (UBRELVY ) 50 MG TABS Take 50 mg by mouth daily as needed (for migraines).    [provider]    Allergies: Nsaids, Penicillins, [name], and Sunscreens    Review of Systems  Constitutional:  Negative for chills and fever.  Respiratory:  Negative for shortness of breath.   Cardiovascular:  Negative for chest pain.  Gastrointestinal:  Positive for abdominal pain and blood in stool. Negative for constipation, diarrhea, nausea and vomiting.  Genitourinary:  Negative for dysuria and hematuria.  Neurological:  Negative for seizures and headaches.  Psychiatric/Behavioral:  Positive for dysphoric mood and hallucinations. Negative for confusion.     Updated Vital Signs BP ROLLEN)  160/106 (BP Location: Right Arm)   Pulse 93   Temp 99.7 F (37.6 C) (Oral)   Resp 18   Ht 5' 10 (1.778 m)   Wt 106.1 kg   SpO2 100%   BMI 33.58 kg/m   Physical Exam Vitals and nursing note reviewed.  Constitutional:      General: He is not in acute distress.    Appearance: He is not ill-appearing or toxic-appearing.  Eyes:     General: No scleral icterus. Cardiovascular:     Rate and Rhythm: Normal rate.  Pulmonary:     Effort: Pulmonary effort is normal. No respiratory distress.  Abdominal:     General: There is no distension.     Palpations: Abdomen is soft.     Tenderness: There is generalized abdominal tenderness. There is no guarding or rebound.  Skin:    General: Skin is warm and dry.  Neurological:     Mental Status: He is alert and oriented to person, place, and time.     GCS: GCS eye subscore is 4. GCS verbal subscore is 5. GCS motor subscore is 6.     Comments: Some tremor but no asterixis noted.   Wife reports he is at a sign.  He is oriented x 4.  Psychiatric:     Comments: Does not appear to be responding to any internal stimuli.     (all labs ordered are listed, but only abnormal results are displayed) Labs Reviewed  CBC WITH DIFFERENTIAL/PLATELET - Abnormal; Notable for the following components:      Result Value   WBC 3.7 (*)    RBC 3.66 (*)    Hemoglobin 12.2 (*)    HCT 35.6 (*)    All other components within normal limits  COMPREHENSIVE METABOLIC PANEL WITH GFR - Abnormal; Notable for the following components:   Sodium 134 (*)    CO2 20 (*)    Total Protein 8.2 (*)    Anion gap 16 (*)    All other components within normal limits  URINALYSIS, ROUTINE W REFLEX MICROSCOPIC - Abnormal; Notable for the following components:   Color, Urine STRAW (*)    Ketones, ur 5 (*)    Protein, ur 100 (*)    All other components within normal limits  I-STAT CHEM 8, ED - Abnormal; Notable for the following components:   Calcium , Ion 1.01 (*)    TCO2 19 (*)    Hemoglobin 12.9 (*)    HCT 38.0 (*)    All other components within normal limits  CALPROTECTIN, FECAL  PROTIME-INR  ETHANOL  AMMONIA  RAPID URINE DRUG SCREEN, HOSP PERFORMED  MAGNESIUM   PHOSPHORUS  LACTATE DEHYDROGENASE  TYPE AND SCREEN    EKG: None  Radiology: CT ANGIO GI BLEED Result Date: 06/20/2024 EXAM: CTA ABDOMEN AND PELVIS WITH CONTRAST 06/20/2024 12:54:12 PM TECHNIQUE: CTA images of the abdomen and pelvis with intravenous contrast. Three-dimensional MIP/volume rendered formations were performed. Automated exposure control, iterative reconstruction, and/or weight based adjustment of the mA/kV was utilized to reduce the radiation dose to as low as reasonably achievable. COMPARISON: 03/10/2024 CT abdomen and pelvis CLINICAL HISTORY: LUQ abdominal pain; diffuse abdominal pain, bright red blood and reported melena. FINDINGS: VASCULATURE: GI BLEED: No evidence of active intraluminal contrast extravasation in the small  or large bowel. AORTA: No acute finding. No abdominal aortic aneurysm. No dissection. CELIAC TRUNK: No acute finding. No occlusion or significant stenosis. SUPERIOR MESEMTRIC ARTERY: No acute finding. No occlusion or significant stenosis. INFERIOR  MESENTERIC ARTERY: No acute finding. No occlusion or significant stenosis. RENAL ARTERIES: No acute finding. No occlusion or significant stenosis. ILIAC ARTERIES: No acute finding. No occlusion or significant stenosis. ABDOMEN/PELVIS: LOWER CHEST: Visualized portion of the lower chest demonstrates no acute abnormality. LIVER: The liver is unremarkable. GALLBLADDER AND BILE DUCTS: Gallbladder is unremarkable. No biliary ductal dilatation. SPLEEN: The spleen is unremarkable. PANCREAS: The pancreas is unremarkable. ADRENAL GLANDS: Bilateral adrenal glands demonstrate no acute abnormality. KIDNEYS, URETERS AND BLADDER: Small simple bilateral renal cysts, largest 2.3 cm in the upper left kidney, for which no follow up imaging is recommended. No stones in the kidneys or ureters. No hydronephrosis. No perinephric or periureteral stranding. Urinary bladder is unremarkable. GI AND BOWEL: Stomach and duodenal sweep demonstrate no acute abnormality. There is no bowel obstruction. No dilated or thick walled small or large bowel loops. No colonic diverticulosis or acute pericolonic fat stranding. REPRODUCTIVE: Mildly enlarged prostate. PERITONEUM AND RETROPERITONEUM: No ascites or free air. Small-to-moderate fat-containing midline supraumbilical hernia. LYMPH NODES: No lymphadenopathy. BONES AND SOFT TISSUES: Mild thoracolumbar spondylosis. No acute abnormality of the bones. No acute soft tissue abnormality. IMPRESSION: 1. No active GI bleeding. No bowel obstruction or bowel wall thickening. No colonic diverticulosis. 2. Small-to-moderate fat-containing midline supraumbilical hernia. Electronically signed by: Selinda Blue MD 06/20/2024 01:20 PM EDT RP Workstation: [id number]      Procedures   Medications Ordered in the ED  thiamine  (VITAMIN B1) tablet 100 mg (100 mg Oral Given 06/20/24 1124)    Or  thiamine  (VITAMIN B1) injection 100 mg ( Intravenous See Alternative 06/20/24 1124)  folic acid  (FOLVITE ) tablet 1 mg (1 mg Oral Given 06/20/24 1124)  multivitamin with minerals tablet 1 tablet (1 tablet Oral Given 06/20/24 1124)  LORazepam  (ATIVAN ) tablet 1-4 mg (1 mg Oral Given 06/20/24 1602)    Or  LORazepam  (ATIVAN ) injection 1-4 mg ( Intravenous See Alternative 06/20/24 1602)  atorvastatin  (LIPITOR) tablet 80 mg (has no administration in time range)  sacubitril -valsartan  (ENTRESTO ) 49-51 mg per tablet (has no administration in time range)  levothyroxine  (SYNTHROID ) tablet 88 mcg (has no administration in time range)  pantoprazole  (PROTONIX ) EC tablet 40 mg (has no administration in time range)  QUEtiapine  (SEROQUEL ) tablet 400 mg (has no administration in time range)  naltrexone  (DEPADE) tablet 50 mg (has no administration in time range)  nicotine  (NICODERM CQ  - dosed in mg/24 hours) patch 14 mg (has no administration in time range)  dapagliflozin  propanediol (FARXIGA ) tablet 10 mg (has no administration in time range)  LORazepam  (ATIVAN ) tablet 2 mg (2 mg Oral Given 06/20/24 1315)  iohexol  (OMNIPAQUE ) 350 MG/ML injection 75 mL (75 mLs Intravenous Contrast Given 06/20/24 1302)  lactated ringers  bolus 1,000 mL (1,000 mLs Intravenous New Bag/Given 06/20/24 1413)   Medical Decision Making Amount and/or Complexity of Data Reviewed Labs: ordered. Radiology: ordered.  Risk OTC drugs. Prescription drug management. Decision regarding hospitalization.   58 y.o. male presents to the ER for evaluation of wanting alcohol withdrawal, diffuse abdominal pain with hematochezia for >1 month, hallucinations, worsening depression. Differential diagnosis includes but is not limited to AAA, mesenteric ischemia, appendicitis, diverticulitis, DKA, gastroenteritis, nephrolithiasis,  pancreatitis, constipation, UTI, bowel obstruction, biliary disease, IBD, PUD, hepatitis alcohol withdrawal, delirium tremens, psych. Vital signs elevated blood pressure otherwise unremarkable. Physical exam as noted above.   Patient reports last drink was earlier this morning.  CIWA protocol in place.  He reports he has been having some diffuse abdominal pain with bright red stools for greater than 1  month.  He has some tenderness upon palpation diffusely.  Will order CT angio and labs.  Digital dose of Ativan  ordered.  I independently reviewed and interpreted the patient's labs.  UDS negative.  Ethanol undetectable.  Urinalysis does Kolker urine with 5 ketones and 100 protein.  Normal PT and INR.  CBC shows white blood cell count 3.7, hemoglobin 12.2 which actually improved from patient's previous labs.  CMP shows sodium 134, bicarb 20, total protein 8.2, anion gap of 16 otherwise no other electrolyte or LFT abnormality.  CT GI Bleed shows  1. No active GI bleeding. No bowel obstruction or bowel wall thickening. No colonic diverticulosis. 2. Small-to-moderate fat-containing midline supraumbilical hernia. Per radiologist's interpretation.    Given the patient's h/o DT seizure and has needed medical admission in the past. He has required repeat ativan . He will need to be admitted. He also has multiple different, and some same, medications coming from multiple different facilities and could benefit from med reconciliation with pharmacy. IM Teaching Service to admit.    Portions of this report may have been transcribed using voice recognition software. Every effort was made to ensure accuracy; however, inadvertent computerized transcription errors may be present.    Final diagnoses:  Alcohol use disorder  Diffuse abdominal pain    ED Discharge Orders     None          Bernis Ernst, [place] 06/20/24 1658    Tegeler, Lonni PARAS, MD 06/21/24 [phone]

## 2024-06-20 NOTE — ED Notes (Signed)
Pt ambulated to the bathroom. Tolerated well.

## 2024-06-20 NOTE — ED Notes (Signed)
 Pt's family states that pt needs to medically detoxed and needs pt's medication sorted out because he is currently getting medications from different doctors. Pt is seeing [facility] d/t continued knee pain after surgery. Pt last drank last night and usually drinks a fifth of liqour and 6 beers a day.

## 2024-06-21 DIAGNOSIS — F209 Schizophrenia, unspecified: Secondary | ICD-10-CM

## 2024-06-21 DIAGNOSIS — Z79899 Other long term (current) drug therapy: Secondary | ICD-10-CM

## 2024-06-21 DIAGNOSIS — F10239 Alcohol dependence with withdrawal, unspecified: Secondary | ICD-10-CM

## 2024-06-21 DIAGNOSIS — K51911 Ulcerative colitis, unspecified with rectal bleeding: Secondary | ICD-10-CM

## 2024-06-21 DIAGNOSIS — E119 Type 2 diabetes mellitus without complications: Secondary | ICD-10-CM

## 2024-06-21 DIAGNOSIS — Z79890 Hormone replacement therapy (postmenopausal): Secondary | ICD-10-CM

## 2024-06-21 DIAGNOSIS — Z7984 Long term (current) use of oral hypoglycemic drugs: Secondary | ICD-10-CM

## 2024-06-21 DIAGNOSIS — I5020 Unspecified systolic (congestive) heart failure: Secondary | ICD-10-CM

## 2024-06-21 DIAGNOSIS — F319 Bipolar disorder, unspecified: Secondary | ICD-10-CM

## 2024-06-21 DIAGNOSIS — Z91148 Patient's other noncompliance with medication regimen for other reason: Secondary | ICD-10-CM

## 2024-06-21 DIAGNOSIS — E039 Hypothyroidism, unspecified: Secondary | ICD-10-CM

## 2024-06-21 LAB — IRON AND TIBC
Iron: 111 ug/dL (ref 45–182)
Saturation Ratios: 36 % (ref 17.9–39.5)
TIBC: 309 ug/dL (ref 250–450)
UIBC: 198 ug/dL

## 2024-06-21 LAB — CBC
HCT: 31.8 % — ABNORMAL LOW (ref 39.0–52.0)
Hemoglobin: 10.6 g/dL — ABNORMAL LOW (ref 13.0–17.0)
MCH: 33.1 pg (ref 26.0–34.0)
MCHC: 33.3 g/dL (ref 30.0–36.0)
MCV: 99.4 fL (ref 80.0–100.0)
Platelets: 270 K/uL (ref 150–400)
RBC: 3.2 MIL/uL — ABNORMAL LOW (ref 4.22–5.81)
RDW: 14 % (ref 11.5–15.5)
WBC: 4.8 K/uL (ref 4.0–10.5)
nRBC: 0 % (ref 0.0–0.2)

## 2024-06-21 LAB — BASIC METABOLIC PANEL WITH GFR
Anion gap: 14 (ref 5–15)
BUN: 11 mg/dL (ref 6–20)
CO2: 19 mmol/L — ABNORMAL LOW (ref 22–32)
Calcium: 8.3 mg/dL — ABNORMAL LOW (ref 8.9–10.3)
Chloride: 102 mmol/L (ref 98–111)
Creatinine, Ser: 1.39 mg/dL — ABNORMAL HIGH (ref 0.61–1.24)
GFR, Estimated: 58 mL/min — ABNORMAL LOW (ref >60)
Glucose, Bld: 160 mg/dL — ABNORMAL HIGH (ref 70–99)
Potassium: 3.4 mmol/L — ABNORMAL LOW (ref 3.5–5.1)
Sodium: 135 mmol/L (ref 135–145)

## 2024-06-21 LAB — C-REACTIVE PROTEIN: CRP: 0.5 mg/dL (ref <1.0)

## 2024-06-21 LAB — TSH: TSH: 5.705 u[IU]/mL — ABNORMAL HIGH (ref 0.350–4.500)

## 2024-06-21 LAB — FERRITIN: Ferritin: 46 ng/mL (ref 24–336)

## 2024-06-21 MED ORDER — GABAPENTIN 300 MG PO CAPS
300.0000 mg | ORAL_CAPSULE | Freq: Three times a day (TID) | ORAL | Status: DC
  Administered 2024-06-21 – 2024-06-23 (×5): 300 mg via ORAL
  Filled 2024-06-21 (×5): qty 1

## 2024-06-21 MED ORDER — MAGNESIUM SULFATE 2 GM/50ML IV SOLN
2.0000 g | Freq: Once | INTRAVENOUS | Status: AC
  Administered 2024-06-21: 2 g via INTRAVENOUS
  Filled 2024-06-21: qty 50

## 2024-06-21 MED ORDER — POTASSIUM CHLORIDE CRYS ER 20 MEQ PO TBCR
40.0000 meq | EXTENDED_RELEASE_TABLET | Freq: Two times a day (BID) | ORAL | Status: AC
  Administered 2024-06-21 (×2): 40 meq via ORAL
  Filled 2024-06-21 (×2): qty 2

## 2024-06-21 MED ORDER — LIDOCAINE 5 % EX PTCH
1.0000 | MEDICATED_PATCH | CUTANEOUS | Status: DC
  Administered 2024-06-21 – 2024-06-23 (×3): 1 via TRANSDERMAL
  Filled 2024-06-21 (×3): qty 1

## 2024-06-21 MED ORDER — CYCLOBENZAPRINE HCL 10 MG PO TABS
10.0000 mg | ORAL_TABLET | Freq: Three times a day (TID) | ORAL | Status: DC | PRN
  Administered 2024-06-22 – 2024-06-24 (×6): 10 mg via ORAL
  Filled 2024-06-21 (×6): qty 1

## 2024-06-21 NOTE — Progress Notes (Signed)
 Patient is complaining pain on his knee. Tylenol  is prescribed. Patient refused tylenol .  Made provider aware. She ordered pain patch. Patient is not happy about that and continuously asking for stronger pain meds. Made the provider aware.

## 2024-06-21 NOTE — Progress Notes (Signed)
 HD#1 SUBJECTIVE:  Patient Summary: Jacob Adams is a 58 y.o. male with PMH of Alcohol Use Disorder, HTN, CHF, GERD, Ulcerative colitis, schizophrenia, and MDD who presented with intractable n/v, anxiety, and tremors and admitted for acute alcohol withdrawal.   Overnight Events: No acute events overnight  Interim History: Jacob Adams was seen and examined at the bedside this morning. He was sitting up eating breakfast and feeling much better. He still admits to auditory hallucinations, but is feeling less anxious and tremulous.   OBJECTIVE:  Vital Signs: Vitals:   06/21/24 0633 06/21/24 0634 06/21/24 0858 06/21/24 0915  BP: 113/82 113/82 (!) 107/92 130/79  Pulse:  (!) 106 (!) 110 (!) 108  Resp:      Temp:   97.7 F (36.5 C) 98.1 F (36.7 C)  TempSrc:   Oral Oral  SpO2: 100% 100% 100% 99%  Weight:      Height:       Supplemental O2: Room Air SpO2: 99 %  Filed Weights   06/20/24 0948  Weight: 106.1 kg     Intake/Output Summary (Last 24 hours) at 06/21/2024 1005 Last data filed at 06/21/2024 0200 Gross per 24 hour  Intake 752 ml  Output 300 ml  Net 452 ml   Net IO Since Admission: 452 mL [06/21/24 1005]  Physical Exam: Const: Awake, alert in NAD HENT: Normocephalic, atraumatic, mucus membranes moist Eyes: PERRL Card: tachycardic with regular rhythm, No MRG, No pitting edema on LE's bilaterally  Resp: LCTAB, no increased work of breathing Abd: Soft, NTND Extremities: Warm, well healed surgical scar noted on RLE  Patient Lines/Drains/Airways Status     Active Line/Drains/Airways     Name Placement date Placement time Site Days   Peripheral IV 06/20/24 20 G Anterior;Distal;Left;Upper Arm 06/20/24  1131  Arm  1   Wound 05/05/24 0802 Surgical Closed Surgical Incision Knee Left 05/05/24  0802  Knee  47            Pertinent labs and imaging:      Latest Ref Rng & Units 06/21/2024    4:28 AM 06/20/2024   12:34 PM 06/20/2024   10:28 AM  CBC  WBC 4.0 - 10.5 K/uL 4.8    3.7   Hemoglobin 13.0 - 17.0 g/dL 89.3  87.0  87.7   Hematocrit 39.0 - 52.0 % 31.8  38.0  35.6   Platelets 150 - 400 K/uL 270   343        Latest Ref Rng & Units 06/21/2024    4:28 AM 06/20/2024   12:34 PM 06/20/2024   10:28 AM  CMP  Glucose 70 - 99 mg/dL 839  90  92   BUN 6 - 20 mg/dL 11  6  6    Creatinine 0.61 - 1.24 mg/dL 8.60  9.19  9.15   Sodium 135 - 145 mmol/L 135  135  134   Potassium 3.5 - 5.1 mmol/L 3.4  3.8  4.1   Chloride 98 - 111 mmol/L 102  103  98   CO2 22 - 32 mmol/L 19   20   Calcium  8.9 - 10.3 mg/dL 8.3   8.9   Total Protein 6.5 - 8.1 g/dL   8.2   Total Bilirubin 0.0 - 1.2 mg/dL   1.0   Alkaline Phos 38 - 126 U/L   88   AST 15 - 41 U/L   32   ALT 0 - 44 U/L   15     CT  ANGIO GI BLEED Result Date: 06/20/2024 EXAM: CTA ABDOMEN AND PELVIS WITH CONTRAST 06/20/2024 12:54:12 PM TECHNIQUE: CTA images of the abdomen and pelvis with intravenous contrast. Three-dimensional MIP/volume rendered formations were performed. Automated exposure control, iterative reconstruction, and/or weight based adjustment of the mA/kV was utilized to reduce the radiation dose to as low as reasonably achievable. COMPARISON: 03/10/2024 CT abdomen and pelvis CLINICAL HISTORY: LUQ abdominal pain; diffuse abdominal pain, bright red blood and reported melena. FINDINGS: VASCULATURE: GI BLEED: No evidence of active intraluminal contrast extravasation in the small or large bowel. AORTA: No acute finding. No abdominal aortic aneurysm. No dissection. CELIAC TRUNK: No acute finding. No occlusion or significant stenosis. SUPERIOR MESEMTRIC ARTERY: No acute finding. No occlusion or significant stenosis. INFERIOR MESENTERIC ARTERY: No acute finding. No occlusion or significant stenosis. RENAL ARTERIES: No acute finding. No occlusion or significant stenosis. ILIAC ARTERIES: No acute finding. No occlusion or significant stenosis. ABDOMEN/PELVIS: LOWER CHEST: Visualized portion of the lower chest demonstrates no acute  abnormality. LIVER: The liver is unremarkable. GALLBLADDER AND BILE DUCTS: Gallbladder is unremarkable. No biliary ductal dilatation. SPLEEN: The spleen is unremarkable. PANCREAS: The pancreas is unremarkable. ADRENAL GLANDS: Bilateral adrenal glands demonstrate no acute abnormality. KIDNEYS, URETERS AND BLADDER: Small simple bilateral renal cysts, largest 2.3 cm in the upper left kidney, for which no follow up imaging is recommended. No stones in the kidneys or ureters. No hydronephrosis. No perinephric or periureteral stranding. Urinary bladder is unremarkable. GI AND BOWEL: Stomach and duodenal sweep demonstrate no acute abnormality. There is no bowel obstruction. No dilated or thick walled small or large bowel loops. No colonic diverticulosis or acute pericolonic fat stranding. REPRODUCTIVE: Mildly enlarged prostate. PERITONEUM AND RETROPERITONEUM: No ascites or free air. Small-to-moderate fat-containing midline supraumbilical hernia. LYMPH NODES: No lymphadenopathy. BONES AND SOFT TISSUES: Mild thoracolumbar spondylosis. No acute abnormality of the bones. No acute soft tissue abnormality. IMPRESSION: 1. No active GI bleeding. No bowel obstruction or bowel wall thickening. No colonic diverticulosis. 2. Small-to-moderate fat-containing midline supraumbilical hernia. Electronically signed by: Selinda Blue MD 06/20/2024 01:20 PM EDT RP Workstation: [id number]    ASSESSMENT/PLAN:  Assessment: Principal Problem:   Alcohol use disorder, severe, dependence (HCC) Active Problems:   MDD (major depressive disorder), recurrent, severe, [name]   Chronic systolic CHF (congestive heart failure) (HCC)   Tobacco dependence   Hypothyroidism   Hypertension   Alcoholic cirrhosis of liver without ascites (HCC)   Portal hypertensive gastropathy (HCC)   Anemia   Lower GI bleed   Plan: #Alcohol withdrawal #Alcohol Use Disorder #Intractable N/V -The patient reports feeling better this morning with  less anxiety and tremors -N/V improved with increased appetite -Last CIWA 6 -Required 5 mg ativan  in the last 24 hours.  -Continue CIWA protocol -Would like to see pt stable, not requiring ativan  for 24 hours before discharge.  -Continue naltrexone   #Ulcerative Colitis #Rectal Bleeding #Lower GI Bleeding #Hx of hemorrhoids -The patient has a known history of ulcerative colitis, however he is not currently on any medications  -He does admit to intermittent bloody stools chronically.  -The patient underwent thorough GI workup last admission in July with EGD showing esophageal ulcers without bleeding. He is scheduled to follow up with [facility] on 9/05. At this time, will hold off on GI consultation due to recent work up and stable hemoglobin and hemodynamic status.  -Last PM, he did have 2 bowel movements that were red streaked -Hgb 10.6 today, down from 12.9 yesterday, however suspect yesterday's labs were hemoconcentrated  from being volume down. Will continue to monitor daily CBC's closely -CRP 0.5, fecal calprotectin pending--do not suspect IBD flare at this time.   #Schizophrenia #MDD #Bipolar disorder #Medication Non adherence -Pt reports improved hallucinations, but is still hearing quiet voices -Notable anxious on exam with flat affect and poor attention to conversation -Pt historically nonadherent to medications  -Awaiting psychiatry consultation  #T2DM -A1C last month was 5.8 -Continue home farxiga  -SSI for coverage  #HFrEF -Last echo with LVEF 40-45% with global hypokinesis -Currently without exacerbation -Will attempt to clarify GDMT with the patient and pharmacy  #Hypothyroidism TSH 5.705 today -Continue home synthroid , unsure if pt takes regularly at home  #Medication Non-adherence -There is a long list of medications from multiple different providers and systems noted within the patient's record. He is currently unsure what he is taking and what he should be  taking -Spoke to pharmacy this AM who is going to assist with a med rec this admission.   Best Practice: Diet: Regular diet IVF: Fluids: None, Rate: None VTE: SCDs Start: 06/20/24 1434 Code: Full  Disposition planning: Therapy Recs: None, DME: none Family Contact: Edwina Turner-Hanisch (wife) DISPO: Anticipated discharge pending to Home pending clinical improvement.  Signature:  Schuyler Novak, DO [facility]  10:05 AM, 06/21/2024  On Call pager [phone]

## 2024-06-21 NOTE — Plan of Care (Signed)
  Problem: Coping: Goal: Level of anxiety will decrease Outcome: Progressing   Problem: Education: Goal: Knowledge of General Education information will improve Description: Including pain rating scale, medication(s)/side effects and non-pharmacologic comfort measures Outcome: Progressing   Problem: Health Behavior/Discharge Planning: Goal: Ability to manage health-related needs will improve Outcome: Progressing   Problem: Clinical Measurements: Goal: Ability to maintain clinical measurements within normal limits will improve Outcome: Progressing Goal: Will remain free from infection Outcome: Progressing Goal: Diagnostic test results will improve Outcome: Progressing Goal: Respiratory complications will improve Outcome: Progressing Goal: Cardiovascular complication will be avoided Outcome: Progressing   Problem: Activity: Goal: Risk for activity intolerance will decrease Outcome: Progressing   Problem: Nutrition: Goal: Adequate nutrition will be maintained Outcome: Progressing   Problem: Coping: Goal: Level of anxiety will decrease Outcome: Progressing   Problem: Elimination: Goal: Will not experience complications related to bowel motility Outcome: Progressing Goal: Will not experience complications related to urinary retention Outcome: Progressing   Problem: Pain Managment: Goal: General experience of comfort will improve and/or be controlled Outcome: Progressing   Problem: Safety: Goal: Ability to remain free from injury will improve Outcome: Progressing   Problem: Skin Integrity: Goal: Risk for impaired skin integrity will decrease Outcome: Progressing

## 2024-06-21 NOTE — TOC Initial Note (Signed)
 Transition of Care [facility]) - Initial/Assessment Note    Patient Details  Name: Jacob Adams MRN: [id number] Date of Birth: 1965-11-05  Transition [facility]) CM/SW Contact:    Jacob Windle A [place], LCSW Phone Number: 06/21/2024, 12:31 PM  Clinical Narrative:                  Pt is from home with spouse. CSW met with pt at bedside regarding consult for hx of alcohol use. He stated that he drinks a 6 or 12 pack of beer daily with a 1/5 of alcohol. When asked how long he has been drinking that amount, he stated a while. CSW provided supported counseling, said he does want to cut down his drinking if he is able.    Pt stated his interested in referral for residential rehab treatment. He reported he has been to [place] [place] rehab facility in the past. Open to referral to other rehab facilities in [place] area.   Barriers for placement possible regarding the bed availability and insurance.   CSW notified April, [phone] ext [phone] at [facility] of pt's admission.   Pt provided permission to CSW to reach out to pt's wife, [name] to assist with disposition planning, etc.   CSW will continue to follow.  Expected Discharge Plan: IP Rehab Facility Barriers to Discharge: Continued Medical Work up   Patient Goals and CMS Choice Patient states their goals for this hospitalization and ongoing recovery are:: slow down on drinking CMS Medicare.gov Compare Post Acute Care list provided to:: Patient Choice offered to / list presented to : Patient      Expected Discharge Plan and Services In-house Referral: Clinical Social Work   Post Acute Care Choice:  (Residential Treatment for alcohol use) Living arrangements for the past 2 months: Single Family Home                                      Prior Living Arrangements/Services Living arrangements for the past 2 months: Single Family Home Lives with:: Spouse                   Activities of Daily Living   ADL Screening (condition at  time of admission) Independently performs ADLs?: Yes (appropriate for developmental age) Is the patient deaf or have difficulty hearing?: No Does the patient have difficulty seeing, even when wearing glasses/contacts?: No Does the patient have difficulty concentrating, remembering, or making decisions?: No  Permission Sought/Granted                  Emotional Assessment Appearance:: Appears older than stated age Attitude/Demeanor/Rapport: Lethargic Affect (typically observed): Accepting, Quiet Orientation: : Oriented to Self, Oriented to Place, Oriented to  Time, Oriented to Situation Alcohol / Substance Use: Alcohol Use, Tobacco Use Psych Involvement: No (comment)  Admission diagnosis:  Lower GI bleed [K92.2] Patient Active Problem List   Diagnosis Date Noted   Lower GI bleed 06/20/2024   Anemia 05/12/2024   Acute lower GI bleeding 05/11/2024   Alcohol use disorder 05/11/2024   GERD (gastroesophageal reflux disease) 05/11/2024   Pain due to total left knee replacement (HCC) 05/11/2024   Status post total left knee replacement 05/05/2024   Primary osteoarthritis of left knee 05/04/2024   Syncope 03/10/2024   Diarrhea 03/10/2024   Hypotension 01/17/2024   Chronic health problem 01/15/2024   AKI (acute kidney injury) (HCC) 01/15/2024   History  of seizure due to alcohol withdrawal 01/15/2024   Abdominal pain 01/15/2024   Major depressive disorder, 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   Major depressive disorder, recurrent, severe without psychotic behavior (HCC) 01/02/2024   Low back pain 12/31/2023   Lactic acid acidosis 12/31/2023   Low glucose level 12/31/2023   Suicidal behavior 12/31/2023   Chronic colitis 04/25/2022   Alcoholic cirrhosis of liver without ascites (HCC) 04/25/2022   Portal hypertensive gastropathy (HCC) 04/25/2022   History of anemia 04/25/2022   Hyponatremia 01/23/2022   High anion gap metabolic acidosis 01/23/2022    Hyperkalemia 01/23/2022   Fatty liver 01/23/2022   Insomnia 10/04/2020   AMS (altered mental status) 07/23/2020   Sleep apnea    Hypertension    Acute metabolic encephalopathy 01/24/2020   Normocytic anemia 08/15/2019   Hypothyroidism 08/15/2019   HLD (hyperlipidemia) 08/15/2019   Anxiety and depression 08/15/2019   Chronic systolic CHF (congestive heart failure) (HCC) 02/08/2019   Tobacco dependence 02/08/2019   Alcohol withdrawal (HCC) 01/2019   HTN (hypertension) 12/24/2018   Alcohol abuse 01/06/2016   MDD (major depressive disorder), recurrent, severe, 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/03/2015   Alcohol use disorder, severe, dependence (HCC) 11/03/2015   History of GI bleed 10/31/2015   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 [place] [address] Phone: [phone] Fax: [phone]     Social Drivers of Health (SDOH) Social History: SDOH Screenings   Food Insecurity: Food Insecurity Present (06/20/2024)  Housing: Low Risk  (06/20/2024)  Transportation Needs: No Transportation Needs (06/20/2024)  Recent Concern: Transportation Needs - At Risk (05/09/2024)   Received from [facility]  Utilities: Not At Risk (06/20/2024)  Alcohol Screen: Low Risk  (01/04/2024)  Depression (PHQ2-9): Medium Risk (06/29/2020)  Financial Resource Strain: Not on File (10/03/2022)   Received from [facility]  Physical Activity: Not on File (10/03/2022)   Received from [facility]  Social Connections: Moderately Isolated (03/10/2024)  Stress: Not on File (10/03/2022)   Received from [facility]  Tobacco Use: High Risk (06/20/2024)   SDOH Interventions:     Readmission  Risk Interventions    01/18/2024   12:53 PM 01/01/2024    2:34 PM  Readmission Risk Prevention Plan  Post Dischage Appt  Complete  Medication Screening  Complete  Transportation Screening Complete Complete  PCP or Specialist Appt within 3-5 Days Complete   HRI or Home Care Consult Complete   Social Work Consult for Recovery Care Planning/Counseling Complete   Palliative Care Screening Not Applicable   Medication Review [employer]) Complete

## 2024-06-21 NOTE — Plan of Care (Signed)
  Problem: Education: Goal: Knowledge of General Education information will improve Description: Including pain rating scale, medication(s)/side effects and non-pharmacologic comfort measures Outcome: Progressing   Problem: Health Behavior/Discharge Planning: Goal: Ability to manage health-related needs will improve Outcome: Progressing   Problem: Clinical Measurements: Goal: Respiratory complications will improve Outcome: Progressing   Problem: Clinical Measurements: Goal: Cardiovascular complication will be avoided Outcome: Progressing   Problem: Activity: Goal: Risk for activity intolerance will decrease Outcome: Progressing   Problem: Nutrition: Goal: Adequate nutrition will be maintained Outcome: Progressing   Problem: Elimination: Goal: Will not experience complications related to bowel motility Outcome: Progressing   Problem: Elimination: Goal: Will not experience complications related to urinary retention Outcome: Progressing   Problem: Safety: Goal: Ability to remain free from injury will improve Outcome: Progressing   Problem: Skin Integrity: Goal: Risk for impaired skin integrity will decrease Outcome: Progressing

## 2024-06-21 NOTE — Congregational Nurse Program (Signed)
 Pt ate dinner, and kept requesting gram crackers, ginger rale, and popsicle  throughout the night. This nurse (primary nurse) have inform other staff that when answering pt's calls for such foods to let this nurse know, so pt can be re-educated about the importance of limiting sugary foods.
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
## 2024-06-22 DIAGNOSIS — K921 Melena: Secondary | ICD-10-CM | POA: Diagnosis not present

## 2024-06-22 DIAGNOSIS — D649 Anemia, unspecified: Secondary | ICD-10-CM | POA: Diagnosis not present

## 2024-06-22 LAB — CBC
HCT: 28.7 % — ABNORMAL LOW (ref 39.0–52.0)
Hemoglobin: 9.6 g/dL — ABNORMAL LOW (ref 13.0–17.0)
MCH: 33.3 pg (ref 26.0–34.0)
MCHC: 33.4 g/dL (ref 30.0–36.0)
MCV: 99.7 fL (ref 80.0–100.0)
Platelets: 268 K/uL (ref 150–400)
RBC: 2.88 MIL/uL — ABNORMAL LOW (ref 4.22–5.81)
RDW: 14.2 % (ref 11.5–15.5)
WBC: 5.1 K/uL (ref 4.0–10.5)
nRBC: 0 % (ref 0.0–0.2)

## 2024-06-22 LAB — RENAL FUNCTION PANEL
Albumin: 3.2 g/dL — ABNORMAL LOW (ref 3.5–5.0)
Anion gap: 8 (ref 5–15)
BUN: 17 mg/dL (ref 6–20)
CO2: 21 mmol/L — ABNORMAL LOW (ref 22–32)
Calcium: 8.1 mg/dL — ABNORMAL LOW (ref 8.9–10.3)
Chloride: 109 mmol/L (ref 98–111)
Creatinine, Ser: 1.11 mg/dL (ref 0.61–1.24)
GFR, Estimated: >60 mL/min (ref >60)
Glucose, Bld: 98 mg/dL (ref 70–99)
Phosphorus: 2.6 mg/dL (ref 2.5–4.6)
Potassium: 4.2 mmol/L (ref 3.5–5.1)
Sodium: 138 mmol/L (ref 135–145)

## 2024-06-22 MED ORDER — BISACODYL 5 MG PO TBEC
10.0000 mg | DELAYED_RELEASE_TABLET | Freq: Once | ORAL | Status: AC
  Administered 2024-06-22: 10 mg via ORAL
  Filled 2024-06-22: qty 2

## 2024-06-22 MED ORDER — QUETIAPINE FUMARATE 100 MG PO TABS
600.0000 mg | ORAL_TABLET | Freq: Every day | ORAL | Status: DC
  Administered 2024-06-22 – 2024-06-23 (×2): 600 mg via ORAL
  Filled 2024-06-22 (×2): qty 6

## 2024-06-22 MED ORDER — POLYETHYLENE GLYCOL 3350 17 GM/SCOOP PO POWD
119.0000 g | Freq: Once | ORAL | Status: AC
  Administered 2024-06-22: 119 g via ORAL
  Filled 2024-06-22: qty 119

## 2024-06-22 MED ORDER — SODIUM CHLORIDE 0.9 % IV SOLN: INTRAVENOUS | Status: AC

## 2024-06-22 MED ORDER — ACETAMINOPHEN 500 MG PO TABS
1000.0000 mg | ORAL_TABLET | Freq: Four times a day (QID) | ORAL | Status: DC
  Administered 2024-06-22 – 2024-06-24 (×8): 1000 mg via ORAL
  Filled 2024-06-22 (×7): qty 2

## 2024-06-22 MED ORDER — OXYCODONE HCL 5 MG PO TABS
5.0000 mg | ORAL_TABLET | Freq: Every day | ORAL | Status: DC | PRN
  Administered 2024-06-22 – 2024-06-24 (×4): 5 mg via ORAL
  Filled 2024-06-22 (×4): qty 1

## 2024-06-22 MED ORDER — NA SULFATE-K SULFATE-MG SULF 17.5-3.13-1.6 GM/177ML PO SOLN
0.5000 | Freq: Once | ORAL | Status: AC
  Administered 2024-06-23: 177 mL via ORAL
  Filled 2024-06-22 (×2): qty 1

## 2024-06-22 MED ORDER — NA SULFATE-K SULFATE-MG SULF 17.5-3.13-1.6 GM/177ML PO SOLN
0.5000 | Freq: Once | ORAL | Status: AC
  Administered 2024-06-24: 177 mL via ORAL

## 2024-06-22 MED ORDER — ACETAMINOPHEN 500 MG PO TABS
1000.0000 mg | ORAL_TABLET | Freq: Four times a day (QID) | ORAL | Status: DC
  Filled 2024-06-22: qty 2
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
## 2024-06-22 NOTE — Congregational Nurse Program (Addendum)
 Pt had one episode of bloody stool, see media. No nausea or vomiting, and ativan  given once. Pt's home meds were found in the cabinet, and given to the charge nurse.

## 2024-06-22 NOTE — Plan of Care (Signed)

## 2024-06-22 NOTE — Progress Notes (Signed)
 HD#2 SUBJECTIVE:  Patient Summary: Jacob Adams is a 58 y.o. male with PMH of Alcohol Use Disorder, HTN, CHF, GERD, Ulcerative colitis, schizophrenia, and MDD who presented with intractable n/v, anxiety, and tremors and admitted for acute alcohol withdrawal.   Overnight Events: PT had large bloody BM with picture in media tab  Interim History: Jacob Adams was seen and examined at the bedside this morning. He was feeling much better, stating that he is still anxious, but not nearly as tremulous and is feeling better overall. He is still having pain in his left knee. All questions and concerns were addressed at this time.   OBJECTIVE:  Vital Signs: Vitals:   06/21/24 2148 06/22/24 0043 06/22/24 0508 06/22/24 0731  BP: (!) 145/99 (!) 143/95 (!) 142/90 (!) 154/83  Pulse: 94 (!) 110 93 90  Resp:   18   Temp:  98.1 F (36.7 C) 97.6 F (36.4 C)   TempSrc:  Oral    SpO2:  100% 100% 100%  Weight:      Height:       Supplemental O2: Room Air SpO2: 100 %  Filed Weights   06/20/24 0948  Weight: 106.1 kg     Intake/Output Summary (Last 24 hours) at 06/22/2024 1016 Last data filed at 06/22/2024 0600 Gross per 24 hour  Intake 1140 ml  Output 500 ml  Net 640 ml   Net IO Since Admission: 1,092 mL [06/22/24 1016]  Physical Exam: Const: Awake, alert in NAD HENT: Normocephalic, atraumatic, mucus membranes moist Card: RRR, No MRG, No pitting edema on LE's bilaterally  Resp: LCTAB, no increased work of breathing Abd: Soft, NTND Extremities: Warm, well healed surgical scar noted on LLE  Patient Lines/Drains/Airways Status     Active Line/Drains/Airways     Name Placement date Placement time Site Days   Peripheral IV 06/20/24 20 G Anterior;Distal;Left;Upper Arm 06/20/24  1131  Arm  1   Wound 05/05/24 0802 Surgical Closed Surgical Incision Knee Left 05/05/24  0802  Knee  47            Pertinent labs and imaging:      Latest Ref Rng & Units 06/22/2024    1:51 AM 06/21/2024    4:28  AM 06/20/2024   12:34 PM  CBC  WBC 4.0 - 10.5 K/uL 5.1  4.8    Hemoglobin 13.0 - 17.0 g/dL 9.6  89.3  87.0   Hematocrit 39.0 - 52.0 % 28.7  31.8  38.0   Platelets 150 - 400 K/uL 268  270         Latest Ref Rng & Units 06/22/2024    1:51 AM 06/21/2024    4:28 AM 06/20/2024   12:34 PM  CMP  Glucose 70 - 99 mg/dL 98  839  90   BUN 6 - 20 mg/dL 17  11  6    Creatinine 0.61 - 1.24 mg/dL 8.88  8.60  9.19   Sodium 135 - 145 mmol/L 138  135  135   Potassium 3.5 - 5.1 mmol/L 4.2  3.4  3.8   Chloride 98 - 111 mmol/L 109  102  103   CO2 22 - 32 mmol/L 21  19    Calcium  8.9 - 10.3 mg/dL 8.1  8.3      No results found.   ASSESSMENT/PLAN:  Assessment: Principal Problem:   Alcohol use disorder, severe, dependence (HCC) Active Problems:   MDD (major depressive disorder), recurrent, severe, [name]   Chronic  systolic CHF (congestive heart failure) (HCC)   Tobacco dependence   Hypothyroidism   Hypertension   Alcoholic cirrhosis of liver without ascites (HCC)   Portal hypertensive gastropathy (HCC)   Anemia   Lower GI bleed   Plan: #Ulcerative Colitis #Rectal Bleeding #Lower GI Bleeding #Hx of hemorrhoids -The patient has a known history of ulcerative colitis, however he is not currently on any medications  -He does admit to intermittent bloody stools chronically.  -The patient underwent thorough GI workup last admission in July with EGD showing esophageal ulcers without bleeding. He is scheduled to follow up with [facility] on 9/05. -CRP 0.5, fecal calprotectin pending--do not suspect IBD flare at this time.  -Last PM, pt did have large, bloody bowel movement. Picture in media tab -Hgb 9.6 today, down from 10.6 yesterday. -Will decrease in hemoglobin and increasing hematochezia, will consult GI for inpatient evaluation.  #Alcohol withdrawal #Alcohol Use Disorder #Intractable N/V -The patient reports feeling better this morning with less anxiety and improved tremors -N/V  improved with increased appetite -Last CIWA 1, Required 1 mg ativan  in the last 24 hours -Continue CIWA protocol -Would like to see pt stable, not requiring ativan  for 24 hours before discharge.  -Continue naltrexone  -Will d/c ativan  today -Pt desires to d/c to inpatient rehabilitation for alcohol use disorder. Appreciate case management's help assistance.  #Schizophrenia #MDD #Bipolar disorder #Medication Non adherence -Pt reports improved hallucinations, but is still hearing quiet voices -Notable anxious on exam with flat affect and poor attention to conversation -Pt historically nonadherent to medications  -Psychiatry consulted and recommended continuing seroquel  -Will increase seroquel  to 600mg  nightly due to pt having increasing difficulty sleeping.   #T2DM -A1C last month was 5.8 -Continue home farxiga  -SSI for coverage  #HFrEF -Last echo with LVEF 40-45% with global hypokinesis -Currently without exacerbation -Will attempt to clarify GDMT with the patient and pharmacy  #Hypothyroidism TSH 5.705 today -Continue home synthroid , unsure if pt takes regularly at home  #Medication Non-adherence -There is a long list of medications from multiple different providers and systems noted within the patient's record. He is currently unsure what he is taking and what he should be taking -Appreciate pharmacy's assistance with med rec.    Best Practice: Diet: Regular diet IVF: Fluids: None, Rate: None VTE: SCDs Start: 06/20/24 1434 Code: Full  Disposition planning: Therapy Recs: None, DME: none Family Contact: Edwina Turner-Hodgkin (wife) DISPO: Anticipated discharge pending to Home pending clinical improvement.  Signature:  Schuyler Novak, DO [facility]  10:16 AM, 06/22/2024  On Call pager [phone]
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
## 2024-06-22 NOTE — TOC Progression Note (Signed)
 Transition of Care [facility]) - Progression Note    Patient Details  Name: Jacob Adams MRN: [id number] Date of Birth: 07-05-1966  Transition of Care [facility]) CM/SW Contact  Ruby Logiudice A [place], LCSW Phone Number: 06/22/2024, 4:22 PM  Clinical Narrative:      CSW also contacted pt's wife [name] and left VM with contact info to reach back out to CSW.  CSW contacted pt's sister Dickey left VM with contact information to reach back out to CSW.   CSW has not received any update or contact from the [place] regarding request for placement to inpatient rehab at [facility].   CSW will continue to follow.   Expected Discharge Plan: IP Rehab Facility Barriers to Discharge: Continued Medical Work up               Expected Discharge Plan and Services In-house Referral: Clinical Social Work   Post Acute Care Choice:  (Residential Treatment for alcohol use) Living arrangements for the past 2 months: Single Family Home                                       Social Drivers of Health (SDOH) Interventions SDOH Screenings   Food Insecurity: Food Insecurity Present (06/20/2024)  Housing: Low Risk  (06/20/2024)  Transportation Needs: No Transportation Needs (06/20/2024)  Recent Concern: Transportation Needs - At Risk (05/09/2024)   Received from [facility]  Utilities: Not At Risk (06/20/2024)  Alcohol Screen: Low Risk  (01/04/2024)  Depression (PHQ2-9): Medium Risk (06/29/2020)  Financial Resource Strain: Not on File (10/03/2022)   Received from [facility]  Physical Activity: Not on File (10/03/2022)   Received from [facility]  Social Connections: Moderately Isolated (03/10/2024)  Stress: Not on File (10/03/2022)   Received from [facility]  Tobacco Use: High Risk (06/20/2024)    Readmission Risk Interventions    01/18/2024   12:53 PM 01/01/2024    2:34 PM  Readmission Risk Prevention Plan  Post Dischage Appt  Complete  Medication Screening  Complete  Transportation Screening Complete Complete  PCP or Specialist  Appt within 3-5 Days Complete   HRI or Home Care Consult Complete   Social Work Consult for Recovery Care Planning/Counseling Complete   Palliative Care Screening Not Applicable   Medication Review Oceanographer) Complete

## 2024-06-23 DIAGNOSIS — D649 Anemia, unspecified: Secondary | ICD-10-CM | POA: Diagnosis not present

## 2024-06-23 LAB — BASIC METABOLIC PANEL WITH GFR
Anion gap: 12 (ref 5–15)
BUN: 11 mg/dL (ref 6–20)
CO2: 20 mmol/L — ABNORMAL LOW (ref 22–32)
Calcium: 8.2 mg/dL — ABNORMAL LOW (ref 8.9–10.3)
Chloride: 108 mmol/L (ref 98–111)
Creatinine, Ser: 0.88 mg/dL (ref 0.61–1.24)
GFR, Estimated: >60 mL/min (ref >60)
Glucose, Bld: 107 mg/dL — ABNORMAL HIGH (ref 70–99)
Potassium: 3.7 mmol/L (ref 3.5–5.1)
Sodium: 140 mmol/L (ref 135–145)

## 2024-06-23 LAB — CBC
HCT: 29 % — ABNORMAL LOW (ref 39.0–52.0)
Hemoglobin: 9.5 g/dL — ABNORMAL LOW (ref 13.0–17.0)
MCH: 33.3 pg (ref 26.0–34.0)
MCHC: 32.8 g/dL (ref 30.0–36.0)
MCV: 101.8 fL — ABNORMAL HIGH (ref 80.0–100.0)
Platelets: 269 K/uL (ref 150–400)
RBC: 2.85 MIL/uL — ABNORMAL LOW (ref 4.22–5.81)
RDW: 14.3 % (ref 11.5–15.5)
WBC: 5.9 K/uL (ref 4.0–10.5)
nRBC: 0 % (ref 0.0–0.2)

## 2024-06-23 MED ORDER — GABAPENTIN 400 MG PO CAPS
500.0000 mg | ORAL_CAPSULE | Freq: Three times a day (TID) | ORAL | Status: DC
  Administered 2024-06-23 – 2024-06-24 (×3): 500 mg via ORAL
  Filled 2024-06-23 (×3): qty 1

## 2024-06-23 MED ORDER — POLYETHYLENE GLYCOL 3350 17 GM/SCOOP PO POWD
119.0000 g | Freq: Once | ORAL | Status: AC
  Administered 2024-06-23: 119 g via ORAL
  Filled 2024-06-23 (×2): qty 119

## 2024-06-23 MED ORDER — LORAZEPAM 1 MG PO TABS: 1.0000 mg | ORAL_TABLET | ORAL | Status: DC | PRN

## 2024-06-23 MED ORDER — POLYETHYLENE GLYCOL 3350 17 GM/SCOOP PO POWD: 119.0000 g | Freq: Once | ORAL | Status: DC

## 2024-06-23 MED ORDER — LORAZEPAM 2 MG/ML IJ SOLN
1.0000 mg | INTRAMUSCULAR | Status: DC | PRN
  Administered 2024-06-23: 2 mg via INTRAVENOUS
  Filled 2024-06-23: qty 1

## 2024-06-23 NOTE — Plan of Care (Signed)

## 2024-06-23 NOTE — Progress Notes (Signed)
 Daily Progress Note  DOA: 06/20/2024 Hospital Day: 4  Cc: GI bleeding, ulcerative colitis  ASSESSMENT    58 y.o. year old male with a history of alcohol abuse admitted with alcohol withdrawal, persistent rectal bleeding with history of untreated ulcerative colitis.  See 06/22/2024 GI consult for details.  CT angiogram negative for active bleeding.  Ferritins low normal but probably not iron deficient.   History of esophageal ulcer seen on EGD 05/08/2024  AKI  Resolved  Normocytic anemia Declining hemoglobin in the setting of persistent rectal bleeding.  Further decline this admission with IV fluids TODAY: Hemoglobin stable overnight at 9.5  ? history of cirrhosis.  No obvious evidence for cirrhosis. Cirrhotic appearing liver not described on two CT scan with contrast this year. US  with patent PV on color Doppler imaging with normal direction of blood flow towards the liver. INR and platelet count are normal. Albumin was normal on admit, now slightly low but can vary acutely with fluid status and inflammation  CHF, LV EF 40-45% per ECHO 12/2023    PLAN   --Continue pantoprazole  twice daily -- He is already scheduled for colonoscopy tomorrow  Subjective   Feels okay.  Had MiraLAX  bowel prep last night but only had 1 bowel movement today  Objective    Recent Labs    06/21/24 0428 06/22/24 0151 06/23/24 0136  WBC 4.8 5.1 5.9  HGB 10.6* 9.6* 9.5*  HCT 31.8* 28.7* 29.0*  MCV 99.4 99.7 101.8*  PLT 270 268 269   Recent Labs    06/21/24 0428  FERRITIN 46  TIBC 309  IRONPCTSAT 36   Recent Labs    06/21/24 0428 06/22/24 0151 06/23/24 0136  NA 135 138 140  K 3.4* 4.2 3.7  CL 102 109 108  CO2 19* 21* 20*  GLUCOSE 160* 98 107*  BUN 11 17 11   CREATININE 1.39* 1.11 0.88  CALCIUM  8.3* 8.1* 8.2*   Recent Labs    06/22/24 0151  ALBUMIN 3.2*    Scheduled inpatient medications:   acetaminophen   1,000 mg Oral Q6H   atorvastatin   80 mg Oral q AM    dapagliflozin  propanediol  10 mg Oral q AM   folic acid   1 mg Oral Daily   gabapentin   300 mg Oral TID   levothyroxine   88 mcg Oral QAC breakfast   lidocaine   1 patch Transdermal Q24H   multivitamin with minerals  1 tablet Oral Daily   Na Sulfate-K Sulfate-Mg Sulfate concentrate  0.5 kit Oral Once   Followed by   NOREEN ON 06/24/2024] Na Sulfate-K Sulfate-Mg Sulfate concentrate  0.5 kit Oral Once   naltrexone   50 mg Oral q AM   pantoprazole   40 mg Oral BID   polyethylene glycol powder  119 g Oral Once   QUEtiapine   600 mg Oral QHS   sacubitril -valsartan   1 tablet Oral BID   thiamine   100 mg Oral Daily   Or   thiamine   100 mg Intravenous Daily   Continuous inpatient infusions:   sodium chloride  20 mL/hr at 06/23/24 0842   PRN inpatient medications: cyclobenzaprine , LORazepam  **OR** LORazepam , nicotine , oxyCODONE   Vital signs in last 24 hours: Temp:  [97.6 F (36.4 C)] 97.6 F (36.4 C) (09/04 0810) Pulse Rate:  [90-103] 90 (09/04 0810) Resp:  [17-18] 17 (09/04 0810) BP: (141-149)/(91-96) 149/96 (09/04 0810) SpO2:  [100 %] 100 % (09/04 0810) Last BM Date : 06/23/24  Intake/Output Summary (Last 24 hours) at 06/23/2024 1406 Last  data filed at 06/23/2024 1238 Gross per 24 hour  Intake 3.07 ml  Output 1450 ml  Net -1446.93 ml    Intake/Output from previous day: 09/03 0701 - 09/04 0700 In: 3.1 [I.V.:3.1] Out: 700 [Urine:700] Intake/Output this shift: Total I/O In: -  Out: 750 [Urine:750]   Physical Exam:  General: Alert male in NAD Heart:  Regular rate and rhythm.  Pulmonary: Normal respiratory effort Abdomen: Soft, nondistended, nontender. Normal bowel sounds. Extremities: No lower extremity edema  Neurologic: Alert and oriented Psych: Pleasant. Cooperative     LOS: 3 days   Vina Dasen ,NP 06/23/2024, 2:06 PM

## 2024-06-23 NOTE — Progress Notes (Signed)
 Patient wife Amalia) called for an update. Called her back @1118 .

## 2024-06-23 NOTE — Progress Notes (Signed)
 Mobility Specialist: Progress Note   06/23/24 1000  Mobility  Activity Ambulated independently  Level of Assistance Independent  Assistive Device None  Distance Ambulated (ft) 1000 ft  Activity Response Tolerated well  Mobility Referral Yes  Mobility visit 1 Mobility  Mobility Specialist Start Time (ACUTE ONLY) 0959  Mobility Specialist Stop Time (ACUTE ONLY) 1010  Mobility Specialist Time Calculation (min) (ACUTE ONLY) 11 min    Pt received ambulating in room, eager for mobility. Ind throughout. No complaints. Returned to room without fault. Left on EOB with all needs met, call bell in reach.   Ileana Lute Mobility Specialist Please contact via SecureChat or Rehab office at [phone]

## 2024-06-23 NOTE — Plan of Care (Signed)

## 2024-06-23 NOTE — TOC Progression Note (Addendum)
 Transition of Care [facility]) - Progression Note    Patient Details  Name: Jacob Adams MRN: [id number] Date of Birth: 10/19/1966  Transition of Care [facility]) CM/SW Contact  Jacob Welp A [place], LCSW Phone Number: 06/23/2024, 9:54 AM  Clinical Narrative:      Update 1541 CSW reached out to Jacob Adams, informed her of pt's medications and ADL's. She is going to proceed with referral and follow up with pt once opening is available at the clinic. CSW then followed up with provided information for Jacob Adams, [phone], ext: [phone], social worker at [facility] where pt receives services. CSW left VM with contact information to assist with outpatient AUD follow up at discharge.    Update 1219 CSW spoke with Jacob Adams at [facility], stated that pt would be a good fit for that program if pt is not receiving PT/OT services or opioid pain management. CSW to follow up with pt regarding. Bed availability in next 3 to 4 weeks. CSW to follow up with Jacob once information is known.   Update 1103 CSW reached out to [facility], [phone] ext [phone]. Left vm with contact information to get back to CSW regarding referral for [facility] program.    CSW spoke with pt at bedside and updated on plan for rehab at [facility]. He said that he did not have information for his [facility] provider or social worker but said that his wife Jacob Adams may know.   CSW then reached out to Jacob Adams, she informed CSW to call [facility] and [facility] and see if someone can connect CSW with referral process.   CSW to follow up when time allows and proceed with plan for rehab.   CSW will continue to follow.   Expected Discharge Plan: IP Rehab Facility Barriers to Discharge: Continued Medical Work up               Expected Discharge Plan and Services In-house Referral: Clinical Social Work   Post Acute Care Choice:  (Residential Treatment for alcohol use) Living arrangements for the past 2 months: Single  Family Home                                       Social Drivers of Health (SDOH) Interventions SDOH Screenings   Food Insecurity: Food Insecurity Present (06/20/2024)  Housing: Low Risk  (06/20/2024)  Transportation Needs: No Transportation Needs (06/20/2024)  Recent Concern: Transportation Needs - At Risk (05/09/2024)   [facility]  Utilities: Not At Risk (06/20/2024)  Alcohol Screen: Low Risk  (01/04/2024)  Depression (PHQ2-9): Medium Risk (06/29/2020)  Financial Resource Strain: Not on File (10/03/2022)   Received from [facility]  Physical Activity: Not on File (10/03/2022)   Received from [facility]  Social Connections: Moderately Isolated (03/10/2024)  Stress: Not on File (10/03/2022)   Received from [facility]  Tobacco Use: High Risk (06/20/2024)    Readmission Risk Interventions    01/18/2024   12:53 PM 01/01/2024    2:34 PM  Readmission Risk Prevention Plan  Post Dischage Appt  Complete  Medication Screening  Complete  Transportation Screening Complete Complete  PCP or Specialist Appt within 3-5 Days Complete   HRI or Home Care Consult Complete   Social Work Consult for Recovery Care Planning/Counseling Complete   Palliative Care Screening Not Applicable   Medication Review Oceanographer) Complete

## 2024-06-23 NOTE — Progress Notes (Signed)
 HD#3 SUBJECTIVE:  Patient Summary: Jacob Adams is a 58 y.o. male with PMH of Alcohol Use Disorder, HTN, CHF, GERD, Ulcerative colitis, schizophrenia, and MDD who presented with intractable n/v, anxiety, and tremors and admitted for acute alcohol withdrawal.   Overnight Events: No acute events overnight  Interim History: Jacob Adams was seen and examined at the bedside this morning. He was feeling okay, but complaining of left leg pain from his recent surgery. He is aware of colonoscopy tomorrow and is completing the prep today. All questions and concerns were addressed at this time.   OBJECTIVE:  Vital Signs: Vitals:   06/22/24 0731 06/22/24 1704 06/23/24 0100 06/23/24 0810  BP: (!) 154/83  (!) 141/91 (!) 149/96  Pulse: 90  (!) 103 90  Resp:   18 17  Temp:   97.6 F (36.4 C) 97.6 F (36.4 C)  TempSrc:   Oral   SpO2: 100% 100% 100% 100%  Weight:      Height:       Supplemental O2: Room Air SpO2: 100 %  Filed Weights   06/20/24 0948  Weight: 106.1 kg     Intake/Output Summary (Last 24 hours) at 06/23/2024 1025 Last data filed at 06/23/2024 0446 Gross per 24 hour  Intake 3.07 ml  Output 700 ml  Net -696.93 ml   Net IO Since Admission: 395.07 mL [06/23/24 1025]  Physical Exam: Const: Awake, alert in NAD HENT: Normocephalic, atraumatic, mucus membranes moist Card: RRR, No MRG, No pitting edema on LE's bilaterally  Resp: LCTAB, no increased work of breathing Abd: Soft, NTND Extremities: Warm, well healed surgical scar noted on LLE  Patient Lines/Drains/Airways Status     Active Line/Drains/Airways     Name Placement date Placement time Site Days   Peripheral IV 06/20/24 20 G Anterior;Distal;Left;Upper Arm 06/20/24  1131  Arm  1   Wound 05/05/24 0802 Surgical Closed Surgical Incision Knee Left 05/05/24  0802  Knee  47            Pertinent labs and imaging:      Latest Ref Rng & Units 06/23/2024    1:36 AM 06/22/2024    1:51 AM 06/21/2024    4:28 AM  CBC  WBC 4.0  - 10.5 K/uL 5.9  5.1  4.8   Hemoglobin 13.0 - 17.0 g/dL 9.5  9.6  89.3   Hematocrit 39.0 - 52.0 % 29.0  28.7  31.8   Platelets 150 - 400 K/uL 269  268  270        Latest Ref Rng & Units 06/23/2024    1:36 AM 06/22/2024    1:51 AM 06/21/2024    4:28 AM  CMP  Glucose 70 - 99 mg/dL 892  98  839   BUN 6 - 20 mg/dL 11  17  11    Creatinine 0.61 - 1.24 mg/dL 9.11  8.88  8.60   Sodium 135 - 145 mmol/L 140  138  135   Potassium 3.5 - 5.1 mmol/L 3.7  4.2  3.4   Chloride 98 - 111 mmol/L 108  109  102   CO2 22 - 32 mmol/L 20  21  19    Calcium  8.9 - 10.3 mg/dL 8.2  8.1  8.3     No results found.   ASSESSMENT/PLAN:  Assessment: Principal Problem:   Alcohol use disorder, severe, dependence (HCC) Active Problems:   MDD (major depressive disorder), recurrent, severe, [name]   Chronic systolic CHF (congestive heart failure) (  HCC)   Tobacco dependence   Hypothyroidism   Hypertension   Alcoholic cirrhosis of liver without ascites (HCC)   Portal hypertensive gastropathy (HCC)   Anemia   Lower GI bleed   Plan: #Ulcerative Colitis #Rectal Bleeding #Lower GI Bleeding #Hx of hemorrhoids -The patient has a known history of ulcerative colitis, however he is not currently on any medications. He does admit to intermittent bloody stools chronically.  -The patient underwent thorough GI workup last admission in July with EGD showing esophageal ulcers without bleeding. He is scheduled to follow up with [facility] on 9/05. -CRP 0.5, fecal calprotectin pending--do not suspect IBD flare at this time.  -Per GI, pt to undergo colonoscopy tomorrow morning--multiple bloody BM's had today due to prep.  -Hgb stable at 9.5  #Alcohol withdrawal #Alcohol Use Disorder #Intractable N/V -The patient reports feeling better this morning with less anxiety and improved tremors -N/V improved with increased appetite -CIWA this AM was 8--will re-initiate ativan  due to pt having hx of withdrawal  seizures -Continue CIWA protocol -Would like to see pt stable, not requiring ativan  for 24 hours before discharge.  -Continue naltrexone  -Pt desires to d/c to inpatient rehabilitation for alcohol use disorder. Appreciate case management's assistance.  #Schizophrenia #MDD #Bipolar disorder #Medication Non adherence -Pt reports improved hallucinations, but is still hearing quiet voices -Pt historically nonadherent to medications  -Psychiatry consulted and recommended continuing seroquel  -Continue seroquel  600mg  at bedtime   #NAGMA -Noted on daily BMP's, upon review of chart, this is chronic with baseline CO2 19-21 -Likely due to alcohol use and chronic increased bowel movements -Encourage oral hydration  #T2DM -A1C last month was 5.8 -Continue home farxiga  -SSI for coverage  #HFrEF -Last echo with LVEF 40-45% with global hypokinesis -Currently without exacerbation -Will attempt to clarify GDMT with the patient and pharmacy  #Hypothyroidism TSH 5.705 today -Continue home synthroid , unsure if pt takes regularly at home  #Medication Non-adherence -There is a long list of medications from multiple different providers and systems noted within the patient's record. He is currently unsure what he is taking and what he should be taking -Appreciate pharmacy's assistance with med rec.    Best Practice: Diet: Regular diet IVF: Fluids: None, Rate: None VTE: SCDs Start: 06/20/24 1434 Code: Full  Disposition planning: Therapy Recs: None, DME: none Family Contact: Edwina Turner-Saad (wife) DISPO: Anticipated discharge pending to Home pending clinical improvement.  Signature:  Schuyler Novak, DO [facility]  10:25 AM, 06/23/2024  On Call pager [phone]

## 2024-06-24 ENCOUNTER — Inpatient Hospital Stay (HOSPITAL_COMMUNITY): Admitting: Anesthesiology

## 2024-06-24 ENCOUNTER — Ambulatory Visit: Payer: MEDICAID | Admitting: Physician Assistant

## 2024-06-24 ENCOUNTER — Other Ambulatory Visit (HOSPITAL_COMMUNITY): Payer: Self-pay

## 2024-06-24 ENCOUNTER — Encounter (HOSPITAL_COMMUNITY): Admission: EM | Disposition: A | Discharge status: Home / Self Care | Payer: Self-pay | Source: Home / Self Care

## 2024-06-24 ENCOUNTER — Encounter (HOSPITAL_COMMUNITY): Payer: Self-pay

## 2024-06-24 DIAGNOSIS — I5022 Chronic systolic (congestive) heart failure: Secondary | ICD-10-CM

## 2024-06-24 DIAGNOSIS — K644 Residual hemorrhoidal skin tags: Secondary | ICD-10-CM

## 2024-06-24 DIAGNOSIS — I110 Hypertensive heart disease with heart failure: Secondary | ICD-10-CM

## 2024-06-24 DIAGNOSIS — K920 Hematemesis: Secondary | ICD-10-CM

## 2024-06-24 DIAGNOSIS — K648 Other hemorrhoids: Secondary | ICD-10-CM | POA: Diagnosis not present

## 2024-06-24 DIAGNOSIS — K921 Melena: Secondary | ICD-10-CM | POA: Diagnosis not present

## 2024-06-24 DIAGNOSIS — D123 Benign neoplasm of transverse colon: Secondary | ICD-10-CM | POA: Diagnosis not present

## 2024-06-24 DIAGNOSIS — K5190 Ulcerative colitis, unspecified, without complications: Secondary | ICD-10-CM

## 2024-06-24 DIAGNOSIS — K649 Unspecified hemorrhoids: Secondary | ICD-10-CM

## 2024-06-24 DIAGNOSIS — K635 Polyp of colon: Secondary | ICD-10-CM

## 2024-06-24 HISTORY — PX: COLONOSCOPY: SHX5424

## 2024-06-24 LAB — CBC
HCT: 32.2 % — ABNORMAL LOW (ref 39.0–52.0)
Hemoglobin: 10.2 g/dL — ABNORMAL LOW (ref 13.0–17.0)
MCH: 32.9 pg (ref 26.0–34.0)
MCHC: 31.7 g/dL (ref 30.0–36.0)
MCV: 103.9 fL — ABNORMAL HIGH (ref 80.0–100.0)
Platelets: 298 K/uL (ref 150–400)
RBC: 3.1 MIL/uL — ABNORMAL LOW (ref 4.22–5.81)
RDW: 14.6 % (ref 11.5–15.5)
WBC: 6.1 K/uL (ref 4.0–10.5)
nRBC: 0 % (ref 0.0–0.2)

## 2024-06-24 LAB — BASIC METABOLIC PANEL WITH GFR
Anion gap: 13 (ref 5–15)
BUN: 6 mg/dL (ref 6–20)
CO2: 19 mmol/L — ABNORMAL LOW (ref 22–32)
Calcium: 8.6 mg/dL — ABNORMAL LOW (ref 8.9–10.3)
Chloride: 108 mmol/L (ref 98–111)
Creatinine, Ser: 0.92 mg/dL (ref 0.61–1.24)
GFR, Estimated: >60 mL/min (ref >60)
Glucose, Bld: 101 mg/dL — ABNORMAL HIGH (ref 70–99)
Potassium: 3.8 mmol/L (ref 3.5–5.1)
Sodium: 140 mmol/L (ref 135–145)

## 2024-06-24 LAB — CALPROTECTIN, FECAL: Calprotectin, Fecal: 22 ug/g (ref 0–120)

## 2024-06-24 SURGERY — COLONOSCOPY
Anesthesia: Monitor Anesthesia Care

## 2024-06-24 MED ORDER — NALTREXONE HCL 50 MG PO TABS
50.0000 mg | ORAL_TABLET | Freq: Every morning | ORAL | 0 refills | Status: DC
  Filled 2024-06-24: qty 30, 30d supply, fill #0

## 2024-06-24 MED ORDER — QUETIAPINE FUMARATE 300 MG PO TABS
600.0000 mg | ORAL_TABLET | Freq: Every day | ORAL | 0 refills | Status: DC
  Filled 2024-06-24: qty 60, 30d supply, fill #0

## 2024-06-24 MED ORDER — PROPOFOL 10 MG/ML IV BOLUS
INTRAVENOUS | Status: DC | PRN
  Administered 2024-06-24: 50 mg via INTRAVENOUS

## 2024-06-24 MED ORDER — ONDANSETRON HCL 4 MG/2ML IJ SOLN
INTRAMUSCULAR | Status: DC | PRN
  Administered 2024-06-24: 4 mg via INTRAVENOUS

## 2024-06-24 MED ORDER — HYDROCORTISONE ACETATE 25 MG RE SUPP
25.0000 mg | Freq: Two times a day (BID) | RECTAL | 1 refills | Status: DC
  Filled 2024-06-24: qty 60, 30d supply, fill #0

## 2024-06-24 MED ORDER — PROPOFOL 500 MG/50ML IV EMUL
INTRAVENOUS | Status: DC | PRN
  Administered 2024-06-24: 150 ug/kg/min via INTRAVENOUS

## 2024-06-24 MED ORDER — GABAPENTIN 100 MG PO CAPS
300.0000 mg | ORAL_CAPSULE | Freq: Three times a day (TID) | ORAL | 0 refills | Status: DC | PRN
  Filled 2024-06-24: qty 90, 10d supply, fill #0

## 2024-06-24 MED ORDER — HYDROCORTISONE ACETATE 25 MG RE SUPP: 25.0000 mg | Freq: Two times a day (BID) | RECTAL | 1 refills | Status: DC

## 2024-06-24 MED ORDER — MESALAMINE 1.2 G PO TBEC
4.8000 g | DELAYED_RELEASE_TABLET | Freq: Every day | ORAL | 1 refills | Status: AC
  Filled 2024-06-24: qty 120, 30d supply, fill #0

## 2024-06-24 MED ORDER — SODIUM CHLORIDE 0.9 % IV SOLN: INTRAVENOUS | Status: DC | PRN

## 2024-06-24 NOTE — Anesthesia Postprocedure Evaluation (Signed)
 Anesthesia Post Note  Patient: Jacob Adams  Procedure(s) Performed: COLONOSCOPY     Patient location during evaluation: PACU Anesthesia Type: MAC Level of consciousness: awake and alert Pain management: pain level controlled Vital Signs Assessment: post-procedure vital signs reviewed and stable Respiratory status: spontaneous breathing, nonlabored ventilation, respiratory function stable and patient connected to nasal cannula oxygen Cardiovascular status: stable and blood pressure returned to baseline Postop Assessment: no apparent nausea or vomiting Anesthetic complications: no   No notable events documented.  Last Vitals:  Vitals:   06/24/24 0830 06/24/24 0840  BP: 122/87 (!) 143/98  Pulse: 91 87  Resp: 20 10  Temp:    SpO2: 100% 94%    Last Pain:  Vitals:   06/24/24 0930  TempSrc:   PainSc: 10-Worst pain ever                 [name] BRAVO

## 2024-06-24 NOTE — Discharge Summary (Signed)
 Name: Jacob Adams MRN: [id number] DOB: Jun 17, 1966 58 y.o. PCP: [facility]  Date of Admission: 06/20/2024  9:33 AM Date of Discharge: 06/24/2024 Attending Physician: Dr. Jone Dauphin  Discharge Diagnosis: 1. Principal Problem:   Alcohol use disorder, severe, dependence (HCC) Active Problems:   MDD (major depressive disorder), recurrent, severe, [name]   Chronic systolic CHF (congestive heart failure) (HCC)   Tobacco dependence   Hypothyroidism   Hypertension   Alcoholic cirrhosis of liver without ascites (HCC)   Portal hypertensive gastropathy (HCC)   Anemia   Lower GI bleed   Hematochezia   Hemorrhoids   Polyp of ascending colon   Discharge Medications: Allergies as of 06/24/2024       Reactions   Nsaids Swelling, Rash, Other (See Comments)   Stomach pain    Penicillins Itching, Swelling, Rash   Tolerated cefazolin  05/05/24   [name] Other (See Comments)   Does not eat pork or pork by-products, personal preference   Sunscreens Swelling, Other (See Comments)   Skin peels        Medication List     STOP taking these medications    haloperidol  1 MG tablet Commonly known as: HALDOL    ondansetron  4 MG tablet Commonly known as: Zofran    sucralfate  1 g tablet Commonly known as: Carafate        TAKE these medications    albuterol  108 (90 Base) MCG/ACT inhaler Commonly known as: VENTOLIN  HFA Inhale 2 puffs into the lungs every 6 (six) hours as needed for wheezing or shortness of breath.   alprostadil 40 MCG injection Commonly known as: EDEX 40 mcg by Intracavitary route 4 days.   atorvastatin  80 MG tablet Commonly known as: LIPITOR Take 1 tablet (80 mg total) by mouth at bedtime. What changed: when to take this   CALTRATE 600+D3 PO Take 1 tablet by mouth in the morning.   carvedilol  6.25 MG tablet Commonly known as: COREG  Take 6.25 mg by mouth 2 (two) times daily.   CertaVite/Antioxidants  Tabs Take 1 tablet by mouth daily.   docusate sodium  100 MG capsule Commonly known as: Colace Take 1 capsule (100 mg total) by mouth daily as needed.   DULoxetine  30 MG capsule Commonly known as: CYMBALTA  Take 1 capsule (30mg ) by mouth twice daily.   Entresto  49-51 MG Generic drug: sacubitril -valsartan  Take 1 tablet by mouth 2 (two) times daily.   Farxiga  10 MG Tabs tablet Generic drug: dapagliflozin  propanediol Take 10 mg by mouth in the morning.   folic acid  1 MG tablet Commonly known as: FOLVITE  Take 1 tablet (1 mg total) by mouth daily.   gabapentin  100 MG capsule Commonly known as: NEURONTIN  Take 3 capsules (300 mg total) by mouth 3 (three) times daily as needed (For burning/shooting pain).   hydrocortisone  25 MG suppository Commonly known as: ANUSOL -HC Place 1 suppository (25 mg total) rectally 2 (two) times daily.   latanoprost  0.005 % ophthalmic solution Commonly known as: XALATAN  Place 1 drop into both eyes daily.   levothyroxine  88 MCG tablet Commonly known as: SYNTHROID  Take 88 mcg by mouth daily before breakfast.   mesalamine  1.2 g EC tablet Commonly known as: LIALDA  Take 4 tablets (4.8 g total) by mouth daily with breakfast.   metFORMIN 500 MG tablet Commonly known as: GLUCOPHAGE Take 500 mg by mouth in the morning.   methocarbamol  750 MG tablet Commonly known as: Robaxin -750 Take 1 tablet (750 mg total) by mouth 3 (three) times  daily as needed for muscle spasms.   metoprolol  succinate 50 MG 24 hr tablet Commonly known as: TOPROL -XL Take 50 mg by mouth daily. Take with or immediately following a meal.   naltrexone  50 MG tablet Commonly known as: DEPADE Take 1 tablet (50 mg total) by mouth in the morning.   oxyCODONE -acetaminophen  5-325 MG tablet Commonly known as: Percocet Take 1-2 tablets by mouth every 8 (eight) hours as needed.   pantoprazole  40 MG tablet Commonly known as: Protonix  Take 1 tablet (40 mg total) by mouth 2 (two) times  daily.   polyethylene glycol powder 17 GM/SCOOP powder Commonly known as: GLYCOLAX /MIRALAX  Take 17 g by mouth daily as needed for mild constipation.   prazosin  5 MG capsule Commonly known as: MINIPRESS  Take 5 mg by mouth at bedtime.   QUEtiapine  300 MG tablet Commonly known as: SEROQUEL  Take 2 tablets (600 mg total) by mouth at bedtime. What changed:  medication strength how much to take   spironolactone  25 MG tablet Commonly known as: ALDACTONE  Take 1 tablet (25 mg total) by mouth daily.   thiamine  100 MG tablet Commonly known as: VITAMIN B1 Take 1 tablet (100 mg total) by mouth daily.   topiramate  50 MG tablet Commonly known as: TOPAMAX  Take 50 mg by mouth 2 (two) times daily.   Ubrelvy  50 MG Tabs Generic drug: Ubrogepant  Take 50 mg by mouth daily as needed (for migraines).        Disposition and follow-up:   JacobJacob Adams was discharged from [facility] in Stable condition.  At the hospital follow up visit please address:  1.  Hematochezia--Pt underwent colonoscopy with GI while inpatient.  It was determined that he had no active inflammatory bowel disease and that most of his rectal bleeding was coming from hemorrhoids.  He is to restart his mesalamine  for chronic UC treatment and begin hydrocortisone  suppositories twice a day. Gastrointestinal disorder-patient has history of ulcerative colitis and underwent colonoscopy here. He is to follow-up outpatient with Jacob Adams Alcohol use disorder-the patient does have a substantial history of alcohol use disorder consuming 6-12 beers +1/5 of liquor per day.  He does have a desire to stop.  He was discharged on naltrexone  50 mg daily and set up to follow-up without patient [facility].  He does desire inpatient alcohol rehab and is awaiting a bed.  2.  Labs / imaging needed at time of follow-up: CBC (anemia with active blood loss per rectum)  3.  Pending labs/ test needing  follow-up: GI biopsies from colonoscopy  Follow-up Appointments:  Follow-up Information     [facility] Follow up.   Specialty: Behavioral Health Why: Your referral has been sent to Intensive Outpatient Program. You can reach out to Bill Garrot at [phone] regarding the status of your referral. Thanks! Contact information: [phone] [place]  [phone]                 Hospital Course by problem list: Jacob Adams is a 58 y.o. person living with a history of alcohol use disorder, major depression, schizophrenia, hypertension, and ulcerative colitis who presented with tremors, anxiety, and rectal bleeding and admitted for alcohol withdrawal now being discharged on hospital day 4 with the following pertinent hospital course:  #Alcohol use disorder #Acute alcohol withdrawal The patient presented after 4 days of consuming nearly only alcohol with persistent nausea and vomiting and no food intake.  He states  that over the past 4 days he does drink more than usual.  His usual amount of drinking is 6 beers and 1/5 of liquor per day.  He was feeling tremulous anxious with a headache and increased hallucinations.  He did have a history of withdrawal seizures in the past.  Upon admission he was initiated on CIWA protocol with as needed Ativan  and started on thiamine  and folate.  Treated withdrawal safely and his Ativan  needs went down daily.  He was also initiated on naltrexone  50 mg daily. On 9/3 he was feeling significantly better and his Ativan  was discontinued however on 9/4 he did have a CIWA score of 8 including headaches and anxiety.  At this time Ativan  was reinitiated but he only required 1 mg within the next 24 hours.  On 9/5 he was evaluated and reported that he was feeling much better.  He was deemed fit for discharge.  He did discuss with case management here and reported that he would like to go to inpatient rehab for alcohol  use which she has attended in the past.  He would complete this at the [facility] however there is a long wait list so he will initiate outpatient follow-up until there is a bed available.  #Anemia #Hematochezia #Hemorrhoids #History of ulcerative colitis The patient came in porting bloody bowel movements frequently.  He was recently admitted to the hospital in July for the same issue in which he underwent an extensive GI workup including an endoscopy which showed shallow esophageal ulcers.  He was unable to undergo a colonoscopy at the time due to insufficient prep.  While admitted he did have multiple bloody bowel movements and a picture was posted in the media tab which showed bright red blood in the toilet.  At this time his hemoglobin had fallen from 12.2 on admission to 9.5.  GI was consulted who recommended an inpatient colonoscopy.  Colonoscopy performed on the morning of 9/5 and resulted with no active inflammatory bowel disease but hemorrhoids which were likely the cause of his rectal bleeding.  It was recommended that he discharged continuing mesalamine  and hydrocortisone  suppositories.  He was deemed fit for discharge at this time  Stable chronic medical conditions: Schizophrenia MDD Hypertension  Subjective Patient was seen and evaluated at bedside this morning.  He was feeling okay and amenable to discharge today with outpatient behavioral health follow-up for his alcohol use.  All questions and concerns were addressed at this time.  Discharge Exam:   BP (!) 143/98   Pulse 87   Temp (!) 97.1 F (36.2 C) (Temporal)   Resp 10   Ht 5' 10 (1.778 m)   Wt 106.1 kg   SpO2 94%   BMI 33.58 kg/m  Discharge exam:  Const: Awake, alert in NAD HENT: Normocephalic, atraumatic Card: RRR, No MRG, No pitting edema on LE's bilaterally  Resp: LCTAB, no increased work of breathing Abd: Soft, NTND Extremities: Warm, pink, well-healed surgical scar noted on the left knee.  Pertinent Labs, Studies,  and Procedures:     Latest Ref Rng & Units 06/24/2024    1:54 AM 06/23/2024    1:36 AM 06/22/2024    1:51 AM  CBC  WBC 4.0 - 10.5 K/uL 6.1  5.9  5.1   Hemoglobin 13.0 - 17.0 g/dL 89.7  9.5  9.6   Hematocrit 39.0 - 52.0 % 32.2  29.0  28.7   Platelets 150 - 400 K/uL 298  269  268  Latest Ref Rng & Units 06/24/2024    1:54 AM 06/23/2024    1:36 AM 06/22/2024    1:51 AM  CMP  Glucose 70 - 99 mg/dL 898  892  98   BUN 6 - 20 mg/dL 6  11  17    Creatinine 0.61 - 1.24 mg/dL 9.07  9.11  8.88   Sodium 135 - 145 mmol/L 140  140  138   Potassium 3.5 - 5.1 mmol/L 3.8  3.7  4.2   Chloride 98 - 111 mmol/L 108  108  109   CO2 22 - 32 mmol/L 19  20  21    Calcium  8.9 - 10.3 mg/dL 8.6  8.2  8.1     CT ANGIO GI BLEED Result Date: 06/20/2024 EXAM: CTA ABDOMEN AND PELVIS WITH CONTRAST 06/20/2024 12:54:12 PM TECHNIQUE: CTA images of the abdomen and pelvis with intravenous contrast. Three-dimensional MIP/volume rendered formations were performed. Automated exposure control, iterative reconstruction, and/or weight based adjustment of the mA/kV was utilized to reduce the radiation dose to as low as reasonably achievable. COMPARISON: 03/10/2024 CT abdomen and pelvis CLINICAL HISTORY: LUQ abdominal pain; diffuse abdominal pain, bright red blood and reported melena. FINDINGS: VASCULATURE: GI BLEED: No evidence of active intraluminal contrast extravasation in the small or large bowel. AORTA: No acute finding. No abdominal aortic aneurysm. No dissection. CELIAC TRUNK: No acute finding. No occlusion or significant stenosis. SUPERIOR MESEMTRIC ARTERY: No acute finding. No occlusion or significant stenosis. INFERIOR MESENTERIC ARTERY: No acute finding. No occlusion or significant stenosis. RENAL ARTERIES: No acute finding. No occlusion or significant stenosis. ILIAC ARTERIES: No acute finding. No occlusion or significant stenosis. ABDOMEN/PELVIS: LOWER CHEST: Visualized portion of the lower chest demonstrates no acute  abnormality. LIVER: The liver is unremarkable. GALLBLADDER AND BILE DUCTS: Gallbladder is unremarkable. No biliary ductal dilatation. SPLEEN: The spleen is unremarkable. PANCREAS: The pancreas is unremarkable. ADRENAL GLANDS: Bilateral adrenal glands demonstrate no acute abnormality. KIDNEYS, URETERS AND BLADDER: Small simple bilateral renal cysts, largest 2.3 cm in the upper left kidney, for which no follow up imaging is recommended. No stones in the kidneys or ureters. No hydronephrosis. No perinephric or periureteral stranding. Urinary bladder is unremarkable. GI AND BOWEL: Stomach and duodenal sweep demonstrate no acute abnormality. There is no bowel obstruction. No dilated or thick walled small or large bowel loops. No colonic diverticulosis or acute pericolonic fat stranding. REPRODUCTIVE: Mildly enlarged prostate. PERITONEUM AND RETROPERITONEUM: No ascites or free air. Small-to-moderate fat-containing midline supraumbilical hernia. LYMPH NODES: No lymphadenopathy. BONES AND SOFT TISSUES: Mild thoracolumbar spondylosis. No acute abnormality of the bones. No acute soft tissue abnormality. IMPRESSION: 1. No active GI bleeding. No bowel obstruction or bowel wall thickening. No colonic diverticulosis. 2. Small-to-moderate fat-containing midline supraumbilical hernia. Electronically signed by: Selinda Blue MD 06/20/2024 01:20 PM EDT RP Workstation: [id number]     Discharge Instructions: Discharge Instructions     Call MD for:  difficulty breathing, headache or visual disturbances   Complete by: As directed    Call MD for:  persistant dizziness or light-headedness   Complete by: As directed    Call MD for:  persistant nausea and vomiting   Complete by: As directed    Call MD for:  severe uncontrolled pain   Complete by: As directed    Call MD for:  temperature >100.4   Complete by: As directed    Diet - low sodium heart healthy   Complete by: As directed    Discharge instructions   Complete by: As  directed    Thank you for allowing us  to be part of your care. You were hospitalized for alcohol withdrawal and rectal bleeding. We treated you with ativan , fluids, and you underwent a colonoscopy  See the changes in your medications and management of your chronic conditions below:  *For your Rectal Bleeding -We have STARTED you on these following medications:  -Mesalamine  1.2g (you have been on this medication previously)--take 4 tablets once per day  -Hydrocortisone  suppositories--insert one suppository per rectum twice daily as needed for hemorrhoids    *For your Alcohol Use Disorder -We have STARTED you on these following medications:  -Naltrexone  50mg --take 1 tablet every morning  -Thiamine -- take 1 pill per day  -You should continue following with outpatient therapy for alcohol use until an inpatient bed becomes available.   *For your hallucinations -We have CHANGED these following medications:  -Seroquel  (quetiapine ) 300mg -- take 2 tablets (total 600mg ) at bedtime.   -Please see you PCP in 7 to 10 days  Please make sure to take your medications as prescribed  Please call your PCP or our clinic if you have any questions or concerns, we may be able to help and keep you from a long and expensive emergency room wait. Our clinic and after hours phone number is [phone]. The best time to call is Monday through Friday 9 am to 4 pm but there is always someone available 24/7 if you have an emergency. If you need medication refills please notify your pharmacy one week in advance and they will send us  a request.   We are glad you are feeling better,  Schuyler Novak, DO Internal Medicine Inpatient Teaching Service at [facility]   Increase activity slowly   Complete by: As directed        Signed: Novak Schuyler, DO 06/24/2024, 11:57 AM

## 2024-06-24 NOTE — Transfer of Care (Signed)
 Immediate Anesthesia Transfer of Care Note  Patient: Jacob Adams  Procedure(s) Performed: COLONOSCOPY  Patient Location: PACU and Endoscopy Unit  Anesthesia Type:MAC  Level of Consciousness: awake, alert , and oriented  Airway & Oxygen Therapy: Patient Spontanous Breathing  Post-op Assessment: Report given to RN, Post -op Vital signs reviewed and stable, and Patient moving all extremities X 4  Post vital signs: Reviewed and stable  Last Vitals:  Vitals Value Taken Time  BP    Temp 36.2 C 06/24/24 08:27  Pulse    Resp    SpO2      Last Pain:  Vitals:   06/24/24 0714  TempSrc: Temporal  PainSc: 8       Patients Stated Pain Goal: 3 (06/24/24 0714)  Complications: No notable events documented.

## 2024-06-24 NOTE — Anesthesia Preprocedure Evaluation (Signed)
 Anesthesia Evaluation  Patient identified by MRN, date of birth, ID band Patient awake    Reviewed: Allergy & Precautions, H&P , NPO status , Patient's Chart, lab work & pertinent test results  Airway Mallampati: II  TM Distance: >3 FB Neck ROM: full    Dental   Pulmonary sleep apnea , Current Smoker and Patient abstained from smoking.   breath sounds clear to auscultation       Cardiovascular hypertension, +CHF   Rhythm:regular Rate:Normal     Neuro/Psych  Headaches PSYCHIATRIC DISORDERS Anxiety Depression     Neuromuscular disease    GI/Hepatic PUD,GERD  ,,  Endo/Other  diabetes, Type 2Hypothyroidism  Class 3 obesity  Renal/GU Renal disease     Musculoskeletal  (+) Arthritis ,    Abdominal   Peds  Hematology  (+) Blood dyscrasia, anemia   Anesthesia Other Findings   Reproductive/Obstetrics                              Anesthesia Physical Anesthesia Plan  ASA: 3  Anesthesia Plan: MAC   Post-op Pain Management:    Induction: Intravenous  PONV Risk Score and Plan: 1 and Propofol  infusion and Treatment may vary due to age or medical condition  Airway Management Planned: Nasal Cannula  Additional Equipment:   Intra-op Plan:   Post-operative Plan:   Informed Consent: I have reviewed the patients History and Physical, chart, labs and discussed the procedure including the risks, benefits and alternatives for the proposed anesthesia with the patient or authorized representative who has indicated his/her understanding and acceptance.     Dental advisory given  Plan Discussed with: CRNA, Anesthesiologist and Surgeon  Anesthesia Plan Comments:          Anesthesia Quick Evaluation

## 2024-06-24 NOTE — TOC Progression Note (Signed)
 Transition of Care [facility]) - Progression Note    Patient Details  Name: Jacob Adams MRN: [id number] Date of Birth: 06/11/1966  [facility] CM/SW Contact  Phares Zaccone A [place], LCSW Phone Number: 06/24/2024, 10:35 AM  Clinical Narrative:     CSW met with pt at bedside to discuss plan for disposition. CSW informed him that he is on the wait list at the [facility] clinic. CSW provided contact information for POC to follow up on referral status.   Pt stated that he had a follow up scheduled at the [facility] for Tuesday September 9th at 2pm for outpatient substance use treatment.   He also requested additional services, CSW sent referral to [facility] CDIOP program. Waiting to hear back on referral and requested coordinator to reach out to pt if acceptance in program is confirmed.   Pt is agreeable to DC today and stated his wife or friend can provide transportation home.   MD notified.   CSW will continue to follow.    Expected Discharge Plan: IP Rehab Facility Barriers to Discharge: Continued Medical Work up               Expected Discharge Plan and Services In-house Referral: Clinical Social Work   Post Acute Care Choice:  (Residential Treatment for alcohol use) Living arrangements for the past 2 months: Single Family Home                                       Social Drivers of Health (SDOH) Interventions SDOH Screenings   Food Insecurity: Food Insecurity Present (06/20/2024)  Housing: Low Risk  (06/20/2024)  Transportation Needs: No Transportation Needs (06/20/2024)  Recent Concern: Transportation Needs - At Risk (05/09/2024)   Received from [facility]  Utilities: Not At Risk (06/20/2024)  Alcohol Screen: Low Risk  (01/04/2024)  Depression (PHQ2-9): Medium Risk (06/29/2020)  Financial Resource Strain: Not on File (10/03/2022)   Received from [facility]  Physical Activity: Not on File (10/03/2022)   Received from [facility]  Social Connections:  Moderately Isolated (03/10/2024)  Stress: Not on File (10/03/2022)   Received from [facility]  Tobacco Use: High Risk (06/24/2024)    Readmission Risk Interventions    01/18/2024   12:53 PM 01/01/2024    2:34 PM  Readmission Risk Prevention Plan  Post Dischage Appt  Complete  Medication Screening  Complete  Transportation Screening Complete Complete  PCP or Specialist Appt within 3-5 Days Complete   HRI or Home Care Consult Complete   Social Work Consult for Recovery Care Planning/Counseling Complete   Palliative Care Screening Not Applicable   Medication Review Oceanographer) Complete

## 2024-06-24 NOTE — Discharge Instructions (Addendum)
 Thank you for allowing us  to be part of your care. You were hospitalized for alcohol withdrawal and rectal bleeding. We treated you with ativan , fluids, and you underwent a colonoscopy  See the changes in your medications and management of your chronic conditions below:  *For your Rectal Bleeding -We have STARTED you on these following medications:  -Mesalamine  1.2g (you have been on this medication previously)--take 4 tablets once per day  -Hydrocortisone  suppositories--insert one suppository per rectum twice daily as needed for hemorrhoids    *For your Alcohol Use Disorder -We have STARTED you on these following medications:  -Naltrexone  50mg --take 1 tablet every morning  -Thiamine -- take 1 pill per day  -You should continue following with outpatient therapy for alcohol use until an inpatient bed becomes available.   *For your hallucinations -We have CHANGED these following medications:  -Seroquel  (quetiapine ) 300mg -- take 2 tablets (total 600mg ) at bedtime.   -Please see you PCP in 7 to 10 days  Please make sure to take your medications as prescribed  Please call your PCP or our clinic if you have any questions or concerns, we may be able to help and keep you from a long and expensive emergency room wait. Our clinic and after hours phone number is [phone]. The best time to call is Monday through Friday 9 am to 4 pm but there is always someone available 24/7 if you have an emergency. If you need medication refills please notify your pharmacy one week in advance and they will send us  a request.   We are glad you are feeling better,  Schuyler Novak, DO Internal Medicine Inpatient Teaching Service at [facility]

## 2024-06-24 NOTE — Op Note (Signed)
 [facility] Patient Name: Jacob Adams Procedure Date : 06/24/2024 MRN: [id number] Attending MD: Inocente Hausen , MD, [id number] Date of Birth: Sep 21, 1966 CSN: [id number] Age: 58 Admit Type: Inpatient Procedure:                Colonoscopy Indications:              Last colonoscopy: July 2023, Hematochezia, Disease                            activity assessment of chronic ulcerative pancolitis Providers:                Inocente Hausen, MD, Ozell Pouch, Felice Sar,                            Technician Referring MD:             Aloha Finner, MD Medicines:                Monitored Anesthesia Care Complications:            No immediate complications. Estimated blood loss:                            Minimal. Estimated Blood Loss:     Estimated blood loss was minimal. Procedure:                Pre-Anesthesia Assessment:                           - Prior to the procedure, a History and Physical                            was performed, and patient medications and                            allergies were reviewed. The patient's tolerance of                            previous anesthesia was also reviewed. The risks                            and benefits of the procedure and the sedation                            options and risks were discussed with the patient.                            All questions were answered, and informed consent                            was obtained. Prior Anticoagulants: The patient has                            taken no anticoagulant or antiplatelet agents. ASA  Grade Assessment: III - A patient with severe                            systemic disease. After reviewing the risks and                            benefits, the patient was deemed in satisfactory                            condition to undergo the procedure.                           After obtaining informed consent, the colonoscope                             was passed under direct vision. Throughout the                            procedure, the patient's blood pressure, pulse, and                            oxygen saturations were monitored continuously. The                            CF-HQ190L ([id number]) Olympus colonoscope was                            introduced through the anus and advanced to the                            terminal ileum. The colonoscopy was performed                            without difficulty. The patient tolerated the                            procedure well. The quality of the bowel                            preparation was good. The terminal ileum, ileocecal                            valve, appendiceal orifice, and rectum were                            photographed. Scope In: 7:56:40 AM Scope Out: 8:18:03 AM Scope Withdrawal Time: 0 hours 14 minutes 1 second  Total Procedure Duration: 0 hours 21 minutes 23 seconds  Findings:      Hemorrhoids were found on perianal exam.      The digital rectal exam was normal. Pertinent negatives include normal       sphincter tone and no palpable rectal lesions.      Inflammation was not found based on the endoscopic appearance of the       mucosa in the colon. This was  graded as Mayo Score 0 (normal or inactive       disease). Four biopsies were taken every 10 cm with a cold forceps from       the right colon and left colon for ulcerative colitis surveillance.       These biopsy specimens were sent to Pathology.      A 4 mm polyp was found in the hepatic flexure. The polyp was sessile.       The polyp was removed with a cold biopsy forceps. Resection and       retrieval were complete.      The terminal ileum appeared normal.      Internal hemorrhoids were found during retroflexion. Impression:               - Hemorrhoids found on perianal exam.                           - Inactive (Mayo Score 0) ulcerative colitis, in                            remission. Biopsied.                            - One 4 mm polyp at the hepatic flexure, removed                            with a cold biopsy forceps. Resected and retrieved.                           - The examined portion of the ileum was normal.                           - Internal hemorrhoids.                           - Internal and external hemorrhoids are the likely                            source of rectal bleeding as no other pertinent                            findings were identified on colonoscopy. Recommend                            medical management as outlined below. Recommendation:           - Return patient to hospital ward for ongoing care.                           - Await pathology results.                           - Recommend resuming previous UC treatment with                            mesalamine  4.8 g daily to maintain remission                           -  May use Anusol /hydrocortisone  suppositories 25 mg                            per rectum twice daily and topical treatments for                            management of hemorrhoids; if medical management is                            not effective can consider referral to colorectal                            surgery in the outpatient setting.                           - Results discussed with patient.                           - Patient should follow-up with Dr. Wilhelmenia                            after hospital discharge. Procedure Code(s):        --- Professional ---                           [phone], Colonoscopy, flexible; with biopsy, single                            or multiple Diagnosis Code(s):        --- Professional ---                           K64.8, Other hemorrhoids                           D12.3, Benign neoplasm of transverse colon (hepatic                            flexure or splenic flexure)                           K92.1, Melena (includes Hematochezia)                           K51.00, Ulcerative (chronic)  pancolitis without                            complications CPT copyright 2022 American Medical Association. All rights reserved. The codes documented in this report are preliminary and upon coder review may  be revised to meet current compliance requirements. Inocente Hausen, MD 06/24/2024 8:27:10 AM This report has been signed electronically. Number of Addenda: 0
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
## 2024-06-24 NOTE — Plan of Care (Signed)

## 2024-06-26 ENCOUNTER — Encounter (HOSPITAL_COMMUNITY): Payer: Self-pay | Admitting: Pediatrics

## 2024-06-27 ENCOUNTER — Telehealth (HOSPITAL_COMMUNITY): Payer: Self-pay | Admitting: Licensed Clinical Social Worker

## 2024-06-27 NOTE — Telephone Encounter (Signed)
 The therapist receives a message that [name]'s wife called about Jacob Adams getting an appointment with this therapist. The therapist attempts to reach [name] leaving a HIPAA-compliant voicemail with his direct callback number.  Zell Maier, MA, LCSW, [facility], LCAS 06/27/2024

## 2024-06-28 LAB — SURGICAL PATHOLOGY

## 2024-07-05 ENCOUNTER — Ambulatory Visit: Payer: Self-pay | Admitting: Pediatrics

## 2024-07-18 ENCOUNTER — Telehealth: Payer: Self-pay

## 2024-07-18 NOTE — Telephone Encounter (Signed)
 Pharmacy Patient Advocate Encounter   Received notification from CoverMyMeds that prior authorization for Ubrelvy  50mg  Tablets is due for renewal.   Insurance verification completed.   The patient is insured through N/A.  Action: Patient hasn't been seen in your office in over a year. Plan requires updated chart notes for PA renewal.  I sent PT a [facility] message advising to call [facility] to schedule an appointment.Jacob Adams

## 2024-07-29 ENCOUNTER — Ambulatory Visit: Admitting: Physician Assistant

## 2024-08-05 ENCOUNTER — Telehealth: Payer: Self-pay

## 2024-08-05 NOTE — Telephone Encounter (Signed)
 Called patient to let him know that handicap application is up front and ready for pick up.

## 2024-08-11 ENCOUNTER — Ambulatory Visit: Payer: MEDICAID

## 2024-08-11 ENCOUNTER — Ambulatory Visit: Admitting: Physician Assistant

## 2024-08-12 ENCOUNTER — Other Ambulatory Visit: Payer: Self-pay

## 2024-08-12 ENCOUNTER — Ambulatory Visit: Payer: MEDICAID | Attending: Physician Assistant

## 2024-08-12 ENCOUNTER — Ambulatory Visit (INDEPENDENT_AMBULATORY_CARE_PROVIDER_SITE_OTHER): Payer: MEDICAID | Admitting: Physician Assistant

## 2024-08-12 ENCOUNTER — Encounter: Payer: Self-pay | Admitting: Physician Assistant

## 2024-08-12 DIAGNOSIS — G8929 Other chronic pain: Secondary | ICD-10-CM | POA: Diagnosis present

## 2024-08-12 DIAGNOSIS — Z96652 Presence of left artificial knee joint: Secondary | ICD-10-CM

## 2024-08-12 DIAGNOSIS — M25562 Pain in left knee: Secondary | ICD-10-CM | POA: Insufficient documentation

## 2024-08-12 DIAGNOSIS — R2681 Unsteadiness on feet: Secondary | ICD-10-CM | POA: Insufficient documentation

## 2024-08-12 DIAGNOSIS — M6281 Muscle weakness (generalized): Secondary | ICD-10-CM | POA: Diagnosis present

## 2024-08-12 MED ORDER — TRAMADOL HCL 50 MG PO TABS: 50.0000 mg | ORAL_TABLET | Freq: Two times a day (BID) | ORAL | 2 refills | Status: DC | PRN
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
## 2024-08-18 ENCOUNTER — Encounter: Payer: Self-pay | Admitting: Orthopedic Surgery

## 2024-08-18 ENCOUNTER — Ambulatory Visit: Payer: MEDICAID

## 2024-08-18 ENCOUNTER — Telehealth: Payer: Self-pay

## 2024-08-18 ENCOUNTER — Ambulatory Visit: Payer: MEDICAID | Admitting: Orthopedic Surgery

## 2024-08-18 DIAGNOSIS — M545 Low back pain, unspecified: Secondary | ICD-10-CM | POA: Diagnosis not present
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
## 2024-08-18 NOTE — Telephone Encounter (Signed)
 Patient was seen today to go over MRI with Dr Addie. However he has not had MRI lumbar spine done yet. Do I need to reorder scan or can we try to get him scheduled? Please call his wife [name] to schedule
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
## 2024-08-22 ENCOUNTER — Ambulatory Visit: Payer: MEDICAID | Attending: Physician Assistant

## 2024-08-22 ENCOUNTER — Encounter: Payer: Self-pay | Admitting: Radiology

## 2024-08-22 DIAGNOSIS — M6281 Muscle weakness (generalized): Secondary | ICD-10-CM | POA: Diagnosis present

## 2024-08-22 DIAGNOSIS — M25562 Pain in left knee: Secondary | ICD-10-CM | POA: Diagnosis present

## 2024-08-22 DIAGNOSIS — G8929 Other chronic pain: Secondary | ICD-10-CM | POA: Diagnosis present

## 2024-08-22 DIAGNOSIS — R2681 Unsteadiness on feet: Secondary | ICD-10-CM | POA: Insufficient documentation
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
## 2024-08-26 ENCOUNTER — Ambulatory Visit: Payer: MEDICAID

## 2024-08-31 ENCOUNTER — Ambulatory Visit: Payer: MEDICAID

## 2024-08-31 DIAGNOSIS — M6281 Muscle weakness (generalized): Secondary | ICD-10-CM

## 2024-08-31 DIAGNOSIS — M25562 Pain in left knee: Secondary | ICD-10-CM | POA: Diagnosis not present

## 2024-08-31 DIAGNOSIS — R2681 Unsteadiness on feet: Secondary | ICD-10-CM

## 2024-08-31 DIAGNOSIS — G8929 Other chronic pain: Secondary | ICD-10-CM
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
## 2024-09-02 ENCOUNTER — Ambulatory Visit: Payer: MEDICAID
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
## 2024-09-07 ENCOUNTER — Telehealth: Payer: Self-pay

## 2024-09-07 ENCOUNTER — Ambulatory Visit: Payer: MEDICAID

## 2024-09-07 NOTE — Telephone Encounter (Signed)
 TC due to missed visit.  Informed patient of next scheduled appointment as well as attendance policy which limits him to one appointment at a time due to number of missed visits.

## 2024-09-09 ENCOUNTER — Ambulatory Visit: Payer: MEDICAID

## 2024-09-12 ENCOUNTER — Ambulatory Visit: Payer: MEDICAID

## 2024-09-12 DIAGNOSIS — G8929 Other chronic pain: Secondary | ICD-10-CM

## 2024-09-12 DIAGNOSIS — M6281 Muscle weakness (generalized): Secondary | ICD-10-CM

## 2024-09-12 DIAGNOSIS — M25562 Pain in left knee: Secondary | ICD-10-CM | POA: Diagnosis not present
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
## 2024-09-14 ENCOUNTER — Telehealth: Payer: Self-pay | Admitting: Orthopedic Surgery

## 2024-09-14 NOTE — Telephone Encounter (Signed)
 Called and advised.

## 2024-09-14 NOTE — Telephone Encounter (Signed)
 Pt called stating Dr Addie was to send referral for MRI. Please send referral. Pt phone number is [phone].
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
## 2024-09-21 ENCOUNTER — Ambulatory Visit: Payer: MEDICAID | Attending: Physician Assistant

## 2024-09-21 DIAGNOSIS — M6281 Muscle weakness (generalized): Secondary | ICD-10-CM | POA: Insufficient documentation

## 2024-09-21 DIAGNOSIS — G8929 Other chronic pain: Secondary | ICD-10-CM | POA: Diagnosis present

## 2024-09-21 DIAGNOSIS — R2681 Unsteadiness on feet: Secondary | ICD-10-CM | POA: Diagnosis present

## 2024-09-21 DIAGNOSIS — M25562 Pain in left knee: Secondary | ICD-10-CM | POA: Insufficient documentation

## 2024-09-27 ENCOUNTER — Encounter: Payer: Self-pay | Admitting: Orthopedic Surgery

## 2024-09-29 ENCOUNTER — Ambulatory Visit: Payer: MEDICAID

## 2024-09-29 DIAGNOSIS — M6281 Muscle weakness (generalized): Secondary | ICD-10-CM | POA: Diagnosis not present

## 2024-09-29 DIAGNOSIS — R2681 Unsteadiness on feet: Secondary | ICD-10-CM

## 2024-09-29 DIAGNOSIS — G8929 Other chronic pain: Secondary | ICD-10-CM
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
## 2024-09-30 ENCOUNTER — Telehealth: Payer: Self-pay | Admitting: Orthopedic Surgery

## 2024-09-30 DIAGNOSIS — M545 Low back pain, unspecified: Secondary | ICD-10-CM

## 2024-09-30 NOTE — Telephone Encounter (Signed)
 [facility] called and wanted to let you know he needs either PT or peer to peer for the lumbar. It didn't authorize for the lumbar CB# [phone] ext [phone]

## 2024-10-01 NOTE — Telephone Encounter (Signed)
 Please tell him that his insurance will not pay for the MRI scan.  Also can you set him up for physical therapy 1 time a week for 6 weeks here.  Also could you let physical therapy know that if he fails physical therapy if they could put that in the discharge summary and we could likely get the scan sooner.  Thanks

## 2024-10-03 ENCOUNTER — Other Ambulatory Visit

## 2024-10-03 NOTE — Addendum Note (Signed)
 Addended by: VANDERBILT LIONEL CROME on: 10/03/2024 09:43 AM   Modules accepted: Orders

## 2024-10-03 NOTE — Telephone Encounter (Signed)
 PT referral placed for [address] since pt has a recruitment consultant. Tried calling pt. Lvm advising I will send a mychart message advising him on this

## 2024-10-04 ENCOUNTER — Ambulatory Visit: Payer: MEDICAID

## 2024-10-04 ENCOUNTER — Telehealth: Payer: Self-pay
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
## 2024-10-06 ENCOUNTER — Ambulatory Visit: Payer: MEDICAID

## 2024-10-11 ENCOUNTER — Ambulatory Visit: Payer: MEDICAID

## 2024-10-14 ENCOUNTER — Ambulatory Visit: Payer: MEDICAID

## 2024-10-25 ENCOUNTER — Ambulatory Visit: Payer: MEDICAID
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
## 2024-11-11 ENCOUNTER — Ambulatory Visit: Admitting: Physician Assistant

## 2024-11-11 ENCOUNTER — Other Ambulatory Visit: Payer: MEDICAID

## 2024-11-11 DIAGNOSIS — Z96652 Presence of left artificial knee joint: Secondary | ICD-10-CM | POA: Diagnosis not present
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
## 2024-11-21 ENCOUNTER — Ambulatory Visit (HOSPITAL_COMMUNITY)
Admission: EM | Admit: 2024-11-21 | Discharge: 2024-11-21 | Disposition: A | Discharge status: Home / Self Care | Payer: MEDICAID | Source: Home / Self Care

## 2024-11-21 ENCOUNTER — Ambulatory Visit (HOSPITAL_COMMUNITY)

## 2024-11-21 DIAGNOSIS — F1090 Alcohol use, unspecified, uncomplicated: Secondary | ICD-10-CM
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
## 2024-11-21 NOTE — Discharge Instructions (Signed)

## 2024-11-21 NOTE — ED Provider Notes (Cosign Needed Addendum)
 Behavioral Health Urgent Care Medical Screening Exam  Patient Name: Jacob Adams MRN: [id number] Date of Evaluation: 11/21/24 Chief Complaint:  Alcohol detox  Diagnosis:  Final diagnoses:  Alcohol use disorder   History of Present illness: Jacob Adams is a 59 y.o. male  with hx of alcohol abuse, HTN, CHF, fatty liver, anemia, hx of GI bleeding, ulcerative colitis. He presents  to the  [facility] after being dropped off by his CM for alcohol detox. As per triage:  I really need to be here for detox, but I don't have my teeth, I don't have my medicine, I don't have my clothes that I need the right clothing, my pants are falling off of me. He states that he wants to come back to tomorrow for detox as he shares he is not ready to commit to staying in a facility at this time. Patient stopped drinking alcohol (beer and liquor) September 2025. However, re-started drinking this month. He reports daily for the past month, fifth and 15-pack of beer per use... Sometimes I hear or see monsters.  Oletta, Cameron HERO, Counselor, Date of Service: 11/21/2024  3:35 PM).  Patient Assessment & ROS: During encounter with patient, he reports a lifelong struggle with alcoholism, shares that he able to regain his sobriety at some point in 2015, but he relapsed shortly thereafter, and has been unable to regain and maintain it. He shares that he drinks 15 beers daily as well as 1/5th of liquor x 2 bottles daily. He reports that he would like to go home today since I did not bring my medicine, my belt or  my teeth. Writer educated that these items with the exception of his teeth are not necessary since he is not allowed to have a belt here on the unit, and we administer our own meds from our pharmacy and do not use patient supplies. Patient seems to be wanting to leave, and stated that in hopes of this, as he states that this does not make a difference and he will not be staying at this facility. Denies current depressive  symptoms, denies symptoms related to any other mental health conditions currently.  He denies any other substance use other than the alcohol, seems to be in the precontemplative stage of his alcoholism, even though he is able to verbalize the possible dangers of overly use of alcohol to this clinical research associate; patient reports a history of multiple seizures in the past related to withdrawing from alcohol, but is currently a poor historian and is unable to chief executive officer timelines of seizure activities. He denies any other substance use, denies suicidal ideations, denies homicidal ideations, denies current AVH, self reports a history of Schizophrenia, reports that the last time that he had AVH was a month ago; denies paranoia, denies delusional thinking; there are no overt signs of psychosis. Unable to recall current meds, but shares that he is taking them as prescribed. Reports that he just recently got services established with the [facility] in [place], and has been to rehab in the past at the [facility], and was able to regain and maintain his sobriety for 2 yrs. Reports that he currently resides with his son, grandson and his GF, states that son is supportive.   Writer called pt's CM with his  consent Amalia Turner-[phone])-She has been made aware that pt is non receptive to staying that the [facility] for rehab and has been discharged. Provided with resources for mental health at discharge. Denies plan or  intent prior to discharge. Educated to f/u with the [facility], or return to this location as needed. [facility] and verbalizes understanding.  Flowsheet Row ED from 11/21/2024 in [facility] ED to Hosp-Admission (Discharged) from 06/20/2024 in [facility] ED to Hosp-Admission (Discharged) from 05/11/2024 in [facility] [address] [facility]  SURGICAL  C-SSRS RISK CATEGORY No Risk No Risk No Risk    Psychiatric Specialty  Exam  Presentation  General Appearance:Casual  Eye Contact:Fair  Speech:Clear and Coherent  Speech Volume:Normal  Handedness:Right   Mood and Affect  Mood: Euthymic  Affect: Congruent   Thought Process  Thought Processes: Coherent  Descriptions of Associations:Intact  Orientation:Full (Time, Place and Person)  Thought Content:Logical; WDL    Hallucinations:None  Ideas of Reference:None  Suicidal Thoughts:No Without Intent  Homicidal Thoughts:No   Sensorium  Memory: Immediate Fair  Judgment: Fair  Insight: Fair   Art Therapist  Concentration: Fair  Attention Span: Fair  Recall:Fair  [employer] of Knowledge: Fair  Language: Fair   Psychomotor Activity  Psychomotor Activity: Normal   Assets  Assets: Resilience   Sleep  Sleep:Fair  Number of hours: No data recorded  Physical Exam: Physical Exam Constitutional:      Appearance: Normal appearance.  Eyes:     Pupils: Pupils are equal, round, and reactive to light.  Musculoskeletal:     Cervical back: Normal range of motion.  Neurological:     General: No focal deficit present.     Mental Status: He is alert and oriented to person, place, and time.  Psychiatric:        Mood and Affect: Mood normal.        Behavior: Behavior normal.        Thought Content: Thought content normal.        Judgment: Judgment normal.    Review of Systems  Psychiatric/Behavioral:  Positive for substance abuse. Negative for depression, hallucinations, memory loss and suicidal ideas. The patient is not nervous/anxious and does not have insomnia.   All other systems reviewed and are negative.  Blood pressure (!) 147/98, pulse 95, temperature 98.2 F (36.8 C), temperature source Oral, resp. rate 18, SpO2 100%. There is no height or weight on file to calculate BMI.  Musculoskeletal: Strength & Muscle Tone: within normal limits Gait & Station: normal Patient leans: N/A   [facility] MSE Discharge  Disposition for Follow up and Recommendations: Based on my evaluation the patient does not appear to have an emergency medical condition and can be discharged with resources and follow up care in outpatient services for [facility], or re turn to this location as needed for mental health emergencies.   Donia Snell, NP 11/21/2024, 5:00 PM

## 2024-11-22 ENCOUNTER — Ambulatory Visit (HOSPITAL_COMMUNITY)
Admission: EM | Admit: 2024-11-22 | Discharge: 2024-11-22 | Disposition: A | Discharge status: Other Acute Inpatient Hospital | Payer: MEDICAID | Source: Home / Self Care

## 2024-11-22 ENCOUNTER — Other Ambulatory Visit (HOSPITAL_COMMUNITY)
Admission: EM | Admit: 2024-11-22 | Discharge: 2024-11-25 | Disposition: A | Payer: MEDICAID | Source: Intra-hospital | Admitting: Nurse Practitioner

## 2024-11-22 DIAGNOSIS — E119 Type 2 diabetes mellitus without complications: Secondary | ICD-10-CM | POA: Diagnosis not present

## 2024-11-22 DIAGNOSIS — F1090 Alcohol use, unspecified, uncomplicated: Secondary | ICD-10-CM

## 2024-11-22 DIAGNOSIS — F209 Schizophrenia, unspecified: Secondary | ICD-10-CM

## 2024-11-22 DIAGNOSIS — F333 Major depressive disorder, recurrent, severe with psychotic symptoms: Secondary | ICD-10-CM

## 2024-11-22 DIAGNOSIS — F1010 Alcohol abuse, uncomplicated: Secondary | ICD-10-CM | POA: Diagnosis present

## 2024-11-22 DIAGNOSIS — F1020 Alcohol dependence, uncomplicated: Secondary | ICD-10-CM

## 2024-11-22 LAB — COMPREHENSIVE METABOLIC PANEL WITH GFR
ALT: 32 U/L (ref 0–44)
AST: 40 U/L (ref 15–41)
Albumin: 4.8 g/dL (ref 3.5–5.0)
Alkaline Phosphatase: 84 U/L (ref 38–126)
Anion gap: 19 — ABNORMAL HIGH (ref 5–15)
BUN: 12 mg/dL (ref 6–20)
CO2: 19 mmol/L — ABNORMAL LOW (ref 22–32)
Calcium: 9.3 mg/dL (ref 8.9–10.3)
Chloride: 99 mmol/L (ref 98–111)
Creatinine, Ser: 1.03 mg/dL (ref 0.61–1.24)
GFR, Estimated: >60 mL/min
Glucose, Bld: 84 mg/dL (ref 70–99)
Potassium: 4.6 mmol/L (ref 3.5–5.1)
Sodium: 136 mmol/L (ref 135–145)
Total Bilirubin: 0.5 mg/dL (ref 0.0–1.2)
Total Protein: 8.3 g/dL — ABNORMAL HIGH (ref 6.5–8.1)

## 2024-11-22 LAB — LIPID PANEL
Cholesterol: 201 mg/dL — ABNORMAL HIGH (ref 0–200)
HDL: 80 mg/dL
LDL Cholesterol: 95 mg/dL (ref 0–99)
Total CHOL/HDL Ratio: 2.5 ratio
Triglycerides: 132 mg/dL
VLDL: 26 mg/dL (ref 0–40)

## 2024-11-22 LAB — URINALYSIS, ROUTINE W REFLEX MICROSCOPIC
Bacteria, UA: NONE SEEN
Bilirubin Urine: NEGATIVE
Glucose, UA: >=500 mg/dL — AB
Ketones, ur: 5 mg/dL — AB
Leukocytes,Ua: NEGATIVE
Nitrite: NEGATIVE
Protein, ur: 100 mg/dL — AB
Specific Gravity, Urine: 1.009 (ref 1.005–1.030)
pH: 5 (ref 5.0–8.0)

## 2024-11-22 LAB — POCT URINE DRUG SCREEN - MANUAL ENTRY (I-SCREEN)
POC Amphetamine UR: NOT DETECTED
POC Buprenorphine (BUP): NOT DETECTED
POC Cocaine UR: NOT DETECTED
POC Marijuana UR: NOT DETECTED
POC Methadone UR: NOT DETECTED
POC Methamphetamine UR: NOT DETECTED
POC Morphine: NOT DETECTED
POC Oxazepam (BZO): NOT DETECTED
POC Oxycodone UR: POSITIVE — AB
POC Secobarbital (BAR): NOT DETECTED

## 2024-11-22 LAB — HEMOGLOBIN A1C
Hgb A1c MFr Bld: 6.1 % — ABNORMAL HIGH (ref 4.8–5.6)
Mean Plasma Glucose: 128.37 mg/dL

## 2024-11-22 LAB — ETHANOL: Alcohol, Ethyl (B): 52 mg/dL — ABNORMAL HIGH

## 2024-11-22 LAB — MAGNESIUM: Magnesium: 2.3 mg/dL (ref 1.7–2.4)

## 2024-11-22 LAB — TSH: TSH: 0.753 u[IU]/mL (ref 0.350–4.500)

## 2024-11-22 MED ORDER — TRAZODONE HCL 50 MG PO TABS
50.0000 mg | ORAL_TABLET | Freq: Every evening | ORAL | Status: DC | PRN
  Administered 2024-11-22 – 2024-11-24 (×2): 50 mg via ORAL
  Filled 2024-11-22 (×2): qty 1

## 2024-11-22 MED ORDER — DAPAGLIFLOZIN PROPANEDIOL 5 MG PO TABS
10.0000 mg | ORAL_TABLET | Freq: Every day | ORAL | Status: DC
  Administered 2024-11-23: 10 mg via ORAL
  Filled 2024-11-22: qty 2

## 2024-11-22 MED ORDER — FOLIC ACID 1 MG PO TABS
1.0000 mg | ORAL_TABLET | Freq: Every day | ORAL | Status: DC
  Administered 2024-11-23 – 2024-11-25 (×3): 1 mg via ORAL
  Filled 2024-11-22 (×3): qty 1

## 2024-11-22 MED ORDER — HALOPERIDOL 5 MG PO TABS: 5.0000 mg | ORAL_TABLET | Freq: Three times a day (TID) | ORAL | Status: DC | PRN

## 2024-11-22 MED ORDER — LATANOPROST 0.005 % OP SOLN
1.0000 [drp] | Freq: Every day | OPHTHALMIC | Status: DC
  Administered 2024-11-23 – 2024-11-25 (×3): 1 [drp] via OPHTHALMIC
  Filled 2024-11-22: qty 2.5

## 2024-11-22 MED ORDER — LORAZEPAM 1 MG PO TABS: 1.0000 mg | ORAL_TABLET | Freq: Every day | ORAL | Status: DC

## 2024-11-22 MED ORDER — MAGNESIUM HYDROXIDE 400 MG/5ML PO SUSP: 30.0000 mL | Freq: Every day | ORAL | Status: DC | PRN

## 2024-11-22 MED ORDER — THIAMINE MONONITRATE 100 MG PO TABS
100.0000 mg | ORAL_TABLET | Freq: Every day | ORAL | Status: DC
  Administered 2024-11-23 – 2024-11-25 (×3): 100 mg via ORAL
  Filled 2024-11-22 (×3): qty 1

## 2024-11-22 MED ORDER — ALUM & MAG HYDROXIDE-SIMETH 200-200-20 MG/5ML PO SUSP: 30.0000 mL | ORAL | Status: DC | PRN

## 2024-11-22 MED ORDER — HYDROXYZINE HCL 25 MG PO TABS
25.0000 mg | ORAL_TABLET | Freq: Four times a day (QID) | ORAL | Status: DC | PRN
  Administered 2024-11-22: 25 mg via ORAL
  Filled 2024-11-22: qty 1

## 2024-11-22 MED ORDER — THIAMINE HCL 100 MG/ML IJ SOLN: 100.0000 mg | Freq: Once | INTRAMUSCULAR | Status: DC

## 2024-11-22 MED ORDER — ACETAMINOPHEN 325 MG PO TABS: 650.0000 mg | ORAL_TABLET | Freq: Four times a day (QID) | ORAL | Status: DC | PRN

## 2024-11-22 MED ORDER — LORAZEPAM 1 MG PO TABS: 1.0000 mg | ORAL_TABLET | Freq: Four times a day (QID) | ORAL | Status: DC | PRN

## 2024-11-22 MED ORDER — ATORVASTATIN CALCIUM 40 MG PO TABS
80.0000 mg | ORAL_TABLET | Freq: Every day | ORAL | Status: DC
  Administered 2024-11-23 – 2024-11-24 (×3): 80 mg via ORAL
  Filled 2024-11-22 (×3): qty 2

## 2024-11-22 MED ORDER — THIAMINE HCL 100 MG/ML IJ SOLN
100.0000 mg | Freq: Once | INTRAMUSCULAR | Status: AC
  Administered 2024-11-22: 100 mg via INTRAMUSCULAR
  Filled 2024-11-22: qty 2

## 2024-11-22 MED ORDER — DOCUSATE SODIUM 100 MG PO CAPS: 100.0000 mg | ORAL_CAPSULE | Freq: Every day | ORAL | Status: DC | PRN

## 2024-11-22 MED ORDER — DIPHENHYDRAMINE HCL 50 MG/ML IJ SOLN: 50.0000 mg | Freq: Three times a day (TID) | INTRAMUSCULAR | Status: DC | PRN

## 2024-11-22 MED ORDER — LOPERAMIDE HCL 2 MG PO CAPS: 2.0000 mg | ORAL_CAPSULE | ORAL | Status: DC | PRN

## 2024-11-22 MED ORDER — LORAZEPAM 1 MG PO TABS: 1.0000 mg | ORAL_TABLET | Freq: Three times a day (TID) | ORAL | Status: DC

## 2024-11-22 MED ORDER — OLANZAPINE 5 MG PO TBDP: 5.0000 mg | ORAL_TABLET | Freq: Three times a day (TID) | ORAL | Status: DC | PRN

## 2024-11-22 MED ORDER — GABAPENTIN 300 MG PO CAPS: 300.0000 mg | ORAL_CAPSULE | Freq: Three times a day (TID) | ORAL | Status: DC | PRN

## 2024-11-22 MED ORDER — ADULT MULTIVITAMIN W/MINERALS CH: 1.0000 | ORAL_TABLET | Freq: Every day | ORAL | Status: DC

## 2024-11-22 MED ORDER — HALOPERIDOL LACTATE 5 MG/ML IJ SOLN: 10.0000 mg | Freq: Three times a day (TID) | INTRAMUSCULAR | Status: DC | PRN

## 2024-11-22 MED ORDER — CARVEDILOL 3.125 MG PO TABS
6.2500 mg | ORAL_TABLET | Freq: Two times a day (BID) | ORAL | Status: DC
  Administered 2024-11-23 (×2): 6.25 mg via ORAL
  Filled 2024-11-22 (×2): qty 2

## 2024-11-22 MED ORDER — LORAZEPAM 1 MG PO TABS: 1.0000 mg | ORAL_TABLET | Freq: Two times a day (BID) | ORAL | Status: DC

## 2024-11-22 MED ORDER — ONDANSETRON 4 MG PO TBDP: 4.0000 mg | ORAL_TABLET | Freq: Four times a day (QID) | ORAL | Status: DC | PRN

## 2024-11-22 MED ORDER — LORAZEPAM 1 MG PO TABS: 1.0000 mg | ORAL_TABLET | Freq: Four times a day (QID) | ORAL | Status: DC

## 2024-11-22 MED ORDER — LORAZEPAM 2 MG/ML IJ SOLN: 2.0000 mg | Freq: Three times a day (TID) | INTRAMUSCULAR | Status: DC | PRN

## 2024-11-22 MED ORDER — HYDROXYZINE HCL 25 MG PO TABS: 25.0000 mg | ORAL_TABLET | Freq: Four times a day (QID) | ORAL | Status: DC | PRN

## 2024-11-22 MED ORDER — DIPHENHYDRAMINE HCL 50 MG PO CAPS: 50.0000 mg | ORAL_CAPSULE | Freq: Three times a day (TID) | ORAL | Status: DC | PRN

## 2024-11-22 MED ORDER — TRAZODONE HCL 50 MG PO TABS: 50.0000 mg | ORAL_TABLET | Freq: Every evening | ORAL | Status: DC | PRN

## 2024-11-22 MED ORDER — ADULT MULTIVITAMIN W/MINERALS CH
1.0000 | ORAL_TABLET | Freq: Every day | ORAL | Status: DC
  Administered 2024-11-22 – 2024-11-25 (×4): 1 via ORAL
  Filled 2024-11-22 (×4): qty 1

## 2024-11-22 MED ORDER — LEVOTHYROXINE SODIUM 88 MCG PO TABS
88.0000 ug | ORAL_TABLET | Freq: Every day | ORAL | Status: DC
  Administered 2024-11-23 – 2024-11-25 (×3): 88 ug via ORAL
  Filled 2024-11-22 (×3): qty 1

## 2024-11-22 MED ORDER — DULOXETINE HCL 30 MG PO CPEP
30.0000 mg | ORAL_CAPSULE | Freq: Two times a day (BID) | ORAL | Status: DC
  Administered 2024-11-23 (×2): 30 mg via ORAL
  Filled 2024-11-22 (×2): qty 1

## 2024-11-22 MED ORDER — THIAMINE MONONITRATE 100 MG PO TABS: 100.0000 mg | ORAL_TABLET | Freq: Every day | ORAL | Status: DC

## 2024-11-22 MED ORDER — ALBUTEROL SULFATE HFA 108 (90 BASE) MCG/ACT IN AERS: 2.0000 | INHALATION_SPRAY | Freq: Four times a day (QID) | RESPIRATORY_TRACT | Status: DC | PRN

## 2024-11-22 MED ORDER — HALOPERIDOL LACTATE 5 MG/ML IJ SOLN: 5.0000 mg | Freq: Three times a day (TID) | INTRAMUSCULAR | Status: DC | PRN

## 2024-11-22 MED ORDER — ALPROSTADIL (VASODILATOR) 40 MCG IC KIT: 40.0000 ug | PACK | INTRACAVERNOUS | Status: DC

## 2024-11-23 LAB — GLUCOSE, CAPILLARY: Glucose-Capillary: 111 mg/dL — ABNORMAL HIGH (ref 70–99)

## 2024-11-23 MED ORDER — METFORMIN HCL ER 500 MG PO TB24
500.0000 mg | ORAL_TABLET | Freq: Every day | ORAL | Status: DC
  Administered 2024-11-23 – 2024-11-25 (×3): 500 mg via ORAL
  Filled 2024-11-23 (×3): qty 1

## 2024-11-23 MED ORDER — OLANZAPINE 7.5 MG PO TABS
15.0000 mg | ORAL_TABLET | Freq: Every day | ORAL | Status: DC
  Administered 2024-11-23 – 2024-11-24 (×2): 15 mg via ORAL
  Filled 2024-11-23 (×2): qty 2

## 2024-11-23 MED ORDER — QUETIAPINE FUMARATE 400 MG PO TABS: 1200.0000 mg | ORAL_TABLET | Freq: Every day | ORAL | Status: DC

## 2024-11-23 MED ORDER — MESALAMINE 1.2 G PO TBEC
4.8000 g | DELAYED_RELEASE_TABLET | Freq: Every day | ORAL | Status: DC
  Administered 2024-11-25: 4.8 g via ORAL
  Filled 2024-11-23 (×4): qty 4

## 2024-11-23 MED ORDER — NICOTINE 21 MG/24HR TD PT24
21.0000 mg | MEDICATED_PATCH | Freq: Every day | TRANSDERMAL | Status: DC
  Administered 2024-11-23 – 2024-11-25 (×3): 21 mg via TRANSDERMAL
  Filled 2024-11-23 (×3): qty 1

## 2024-11-23 MED ORDER — FAMOTIDINE 20 MG PO TABS
20.0000 mg | ORAL_TABLET | Freq: Two times a day (BID) | ORAL | Status: DC
  Administered 2024-11-23 – 2024-11-25 (×4): 20 mg via ORAL
  Filled 2024-11-23 (×4): qty 1

## 2024-11-23 MED ORDER — METOPROLOL SUCCINATE ER 50 MG PO TB24
100.0000 mg | ORAL_TABLET | Freq: Every day | ORAL | Status: DC
  Administered 2024-11-24 – 2024-11-25 (×2): 100 mg via ORAL
  Filled 2024-11-23 (×2): qty 2

## 2024-11-23 MED ORDER — OYSTER SHELL CALCIUM/D3 500-5 MG-MCG PO TABS
1.0000 | ORAL_TABLET | Freq: Every day | ORAL | Status: DC
  Administered 2024-11-24 – 2024-11-25 (×2): 1 via ORAL
  Filled 2024-11-23 (×2): qty 1

## 2024-11-23 MED ORDER — TOPIRAMATE 25 MG PO TABS
50.0000 mg | ORAL_TABLET | Freq: Two times a day (BID) | ORAL | Status: DC
  Administered 2024-11-23 – 2024-11-25 (×4): 50 mg via ORAL
  Filled 2024-11-23 (×4): qty 2

## 2024-11-23 MED ORDER — SACUBITRIL-VALSARTAN 49-51 MG PO TABS
1.0000 | ORAL_TABLET | Freq: Two times a day (BID) | ORAL | Status: DC
  Administered 2024-11-23 – 2024-11-25 (×4): 1 via ORAL
  Filled 2024-11-23 (×4): qty 1

## 2024-11-23 MED ORDER — DULOXETINE HCL 60 MG PO CPEP
60.0000 mg | ORAL_CAPSULE | Freq: Two times a day (BID) | ORAL | Status: DC
  Administered 2024-11-23 – 2024-11-25 (×4): 60 mg via ORAL
  Filled 2024-11-23 (×4): qty 1

## 2024-11-23 MED ORDER — PANTOPRAZOLE SODIUM 40 MG PO TBEC
40.0000 mg | DELAYED_RELEASE_TABLET | Freq: Two times a day (BID) | ORAL | Status: DC
  Administered 2024-11-23 – 2024-11-25 (×4): 40 mg via ORAL
  Filled 2024-11-23 (×4): qty 1

## 2024-11-23 NOTE — ED Notes (Signed)
 Assumed care of pt at 1130am. Pt coperative. No inappropriate behaviors observed or reported at this time. Environment secured. Safety checks in place per facility protocol.

## 2024-11-23 NOTE — [facility] Group Notes (Signed)
 Adult Psychoeducational Group Note  Date:  11/23/2024 Time:  2:24 PM  Group Topic/Focus:  Emotional Education:  Mental Health Trivia & Questionnaire   Participation Level:  Did Not Attend  Additional Comments:  Pt was invited but did not attend group  Jacob Adams 11/23/2024, 2:24 PM

## 2024-11-23 NOTE — Group Note (Signed)
 Group Topic: Relaxation  Group Date: 11/23/2024 Start Time: 1200 End Time: 1230 Facilitators: Reinhold Minor BROCKS, RN  Department: [facility]  Number of Participants: 5  Group Focus: coping skills Treatment Modality:  Psychoeducation Interventions utilized were patient education Purpose: enhance coping skills and reinforce self-care  Name: Jacob Adams Date of Birth: 1966-02-25  MR: [id number]    Level of Participation: minimal Quality of Participation: cooperative Interactions with others: gave feedback Mood/Affect: positive Triggers (if applicable):  Cognition: coherent/clear Progress: Minimal Response:  continue to practice coping skills Plan: patient will be encouraged to continue to practice coping skills Patients Problems:  Patient Active Problem List   Diagnosis Date Noted   Hematochezia 06/24/2024   Hemorrhoids 06/24/2024   Polyp of ascending colon 06/24/2024   Lower GI bleed 06/20/2024   Anemia 05/12/2024   Acute lower GI bleeding 05/11/2024   Alcohol use disorder 05/11/2024   GERD (gastroesophageal reflux disease) 05/11/2024   Pain due to total left knee replacement 05/11/2024   Status post total left knee replacement 05/05/2024   Primary osteoarthritis of left knee 05/04/2024   Syncope 03/10/2024   Diarrhea 03/10/2024   Hypotension 01/17/2024   Chronic health problem 01/15/2024   AKI (acute kidney injury) 01/15/2024   History of seizure due to alcohol withdrawal 01/15/2024   Abdominal pain 01/15/2024   Major depressive disorder, 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   Major depressive disorder, recurrent, severe without psychotic behavior (HCC) 01/02/2024   Low back pain 12/31/2023   Lactic acid acidosis 12/31/2023   Low glucose level 12/31/2023   Suicidal behavior 12/31/2023   Chronic colitis 04/25/2022   Alcoholic cirrhosis of liver without ascites (HCC) 04/25/2022   Portal hypertensive gastropathy  (HCC) 04/25/2022   History of anemia 04/25/2022   Hyponatremia 01/23/2022   High anion gap metabolic acidosis 01/23/2022   Hyperkalemia 01/23/2022   Fatty liver 01/23/2022   Insomnia 10/04/2020   AMS (altered mental status) 07/23/2020   Sleep apnea    Hypertension    Acute metabolic encephalopathy 01/24/2020   Normocytic anemia 08/15/2019   Hypothyroidism 08/15/2019   HLD (hyperlipidemia) 08/15/2019   Anxiety and depression 08/15/2019   Chronic systolic CHF (congestive heart failure) (HCC) 02/08/2019   Tobacco dependence 02/08/2019   Alcohol withdrawal (HCC) 01/2019   HTN (hypertension) 12/24/2018   Alcohol abuse 01/06/2016   MDD (major depressive disorder), recurrent, severe, 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/03/2015   Alcohol use disorder, severe, dependence (HCC) 11/03/2015   History of GI bleed 10/31/2015   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

## 2024-11-23 NOTE — ED Notes (Signed)
 Patient denies pain and is resting comfortably.

## 2024-11-23 NOTE — ED Notes (Signed)
 Pt  received at the beginning of the shift, awake and oriented.  Pt seated in the day room without complaints.  Affect remeaiins flat.  Safety maintained.

## 2024-11-23 NOTE — Group Note (Signed)
 Group Topic: Relapse and Recovery  Group Date: 11/23/2024 Start Time: 2030 End Time: 2100 Facilitators: Anice Benton LABOR, NT  Department: [facility]  Number of Participants: 6  Group Focus: abuse issues, feeling awareness/expression, and relapse prevention(AA Group) Treatment Modality:  Patient-Centered Therapy and Solution-Focused Therapy Interventions utilized were group exercise and support Purpose: enhance coping skills, express feelings, and relapse prevention strategies  Name: Jacob Adams Date of Birth: September 08, 1966  MR: [id number]    Level of Participation: active Quality of Participation: attentive Interactions with others: gave feedback Mood/Affect: appropriate Triggers (if applicable): N/A Cognition: coherent/clear Progress: Moderate Response: good Plan: follow-up needed  Patients Problems:  Patient Active Problem List   Diagnosis Date Noted   Hematochezia 06/24/2024   Hemorrhoids 06/24/2024   Polyp of ascending colon 06/24/2024   Lower GI bleed 06/20/2024   Anemia 05/12/2024   Acute lower GI bleeding 05/11/2024   Alcohol use disorder 05/11/2024   GERD (gastroesophageal reflux disease) 05/11/2024   Pain due to total left knee replacement 05/11/2024   Status post total left knee replacement 05/05/2024   Primary osteoarthritis of left knee 05/04/2024   Syncope 03/10/2024   Diarrhea 03/10/2024   Hypotension 01/17/2024   Chronic health problem 01/15/2024   AKI (acute kidney injury) 01/15/2024   History of seizure due to alcohol withdrawal 01/15/2024   Abdominal pain 01/15/2024   Major depressive disorder, 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   Major depressive disorder, recurrent, severe without psychotic behavior (HCC) 01/02/2024   Low back pain 12/31/2023   Lactic acid acidosis 12/31/2023   Low glucose level 12/31/2023   Suicidal behavior 12/31/2023   Chronic colitis 04/25/2022   Alcoholic cirrhosis of  liver without ascites (HCC) 04/25/2022   Portal hypertensive gastropathy (HCC) 04/25/2022   History of anemia 04/25/2022   Hyponatremia 01/23/2022   High anion gap metabolic acidosis 01/23/2022   Hyperkalemia 01/23/2022   Fatty liver 01/23/2022   Insomnia 10/04/2020   AMS (altered mental status) 07/23/2020   Sleep apnea    Hypertension    Acute metabolic encephalopathy 01/24/2020   Normocytic anemia 08/15/2019   Hypothyroidism 08/15/2019   HLD (hyperlipidemia) 08/15/2019   Anxiety and depression 08/15/2019   Chronic systolic CHF (congestive heart failure) (HCC) 02/08/2019   Tobacco dependence 02/08/2019   Alcohol withdrawal (HCC) 01/2019   HTN (hypertension) 12/24/2018   Alcohol abuse 01/06/2016   MDD (major depressive disorder), recurrent, severe, 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/03/2015   Alcohol use disorder, severe, dependence (HCC) 11/03/2015   History of GI bleed 10/31/2015   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

## 2024-11-23 NOTE — Care Plan (Signed)
 Interdisciplinary Treatment and Diagnostic Plan Update  11/23/2024 Time of Session: 2PM Jacob Adams MRN: [id number]  Diagnosis:  Final diagnoses:  None     Current Medications:  Current Facility-Administered Medications  Medication Dose Route Frequency Provider Last Rate Last Admin   acetaminophen  (TYLENOL ) tablet 650 mg  650 mg Oral Q6H PRN Trudy Carwin, NP       albuterol  (VENTOLIN  HFA) 108 (90 Base) MCG/ACT inhaler 2 puff  2 puff Inhalation Q6H PRN Trudy Carwin, NP       alum & mag hydroxide-simeth (MAALOX/MYLANTA) 200-200-20 MG/5ML suspension 30 mL  30 mL Oral Q4H PRN Trudy Carwin, NP       atorvastatin  (LIPITOR) tablet 80 mg  80 mg Oral QHS Trudy Carwin, NP   80 mg at 11/23/24 0005   [START ON 11/24/2024] calcium -vitamin D  (OSCAL WITH D) 500-5 MG-MCG per tablet 1 tablet  1 tablet Oral Q lunch Tex Drilling, NP       dapagliflozin  propanediol (FARXIGA ) tablet 10 mg  10 mg Oral QAC breakfast Trudy Carwin, NP   10 mg at 11/23/24 9046   haloperidol  (HALDOL ) tablet 5 mg  5 mg Oral TID PRN Trudy Carwin, NP       And   diphenhydrAMINE  (BENADRYL ) capsule 50 mg  50 mg Oral TID PRN Trudy Carwin, NP       haloperidol  lactate (HALDOL ) injection 5 mg  5 mg Intramuscular TID PRN Trudy Carwin, NP       And   diphenhydrAMINE  (BENADRYL ) injection 50 mg  50 mg Intramuscular TID PRN Trudy Carwin, NP       And   LORazepam  (ATIVAN ) injection 2 mg  2 mg Intramuscular TID PRN Trudy Carwin, NP       haloperidol  lactate (HALDOL ) injection 10 mg  10 mg Intramuscular TID PRN Trudy Carwin, NP       And   diphenhydrAMINE  (BENADRYL ) injection 50 mg  50 mg Intramuscular TID PRN Trudy Carwin, NP       And   LORazepam  (ATIVAN ) injection 2 mg  2 mg Intramuscular TID PRN Trudy Carwin, NP       docusate sodium  (COLACE) capsule 100 mg  100 mg Oral Daily PRN Trudy Carwin, NP       DULoxetine  (CYMBALTA ) DR capsule 60 mg  60 mg Oral BID Nkwenti, Doris, NP       famotidine  (PEPCID ) tablet 20 mg  20 mg  Oral BID Nkwenti, Doris, NP       folic acid  (FOLVITE ) tablet 1 mg  1 mg Oral Daily Trudy Carwin, NP   1 mg at 11/23/24 9045   gabapentin  (NEURONTIN ) capsule 300 mg  300 mg Oral TID PRN Trudy Carwin, NP       hydrOXYzine  (ATARAX ) tablet 25 mg  25 mg Oral Q6H PRN Trudy Carwin, NP   25 mg at 11/22/24 2240   latanoprost  (XALATAN ) 0.005 % ophthalmic solution 1 drop  1 drop Both Eyes Daily Trudy Carwin, NP   1 drop at 11/23/24 1026   levothyroxine  (SYNTHROID ) tablet 88 mcg  88 mcg Oral Q0600 Trudy Carwin, NP   88 mcg at 11/23/24 0636   loperamide  (IMODIUM ) capsule 2-4 mg  2-4 mg Oral PRN Trudy Carwin, NP       LORazepam  (ATIVAN ) tablet 1 mg  1 mg Oral Q6H PRN Trudy Carwin, NP       magnesium  hydroxide (MILK OF MAGNESIA) suspension 30 mL  30 mL Oral Daily PRN Trudy Carwin, NP       [  START ON 11/24/2024] mesalamine  (LIALDA ) EC tablet 4.8 g  4.8 g Oral Q breakfast Nkwenti, Doris, NP       metFORMIN  (GLUCOPHAGE -XR) 24 hr tablet 500 mg  500 mg Oral Q breakfast Tex Drilling, NP       NOREEN ON 11/24/2024] metoprolol  succinate (TOPROL -XL) 24 hr tablet 100 mg  100 mg Oral Daily Nkwenti, Doris, NP       multivitamin with minerals tablet 1 tablet  1 tablet Oral Daily Trudy Carwin, NP   1 tablet at 11/23/24 9046   ondansetron  (ZOFRAN -ODT) disintegrating tablet 4 mg  4 mg Oral Q6H PRN Trudy Carwin, NP       thiamine  (VITAMIN B1) tablet 100 mg  100 mg Oral Daily Trudy Carwin, NP   100 mg at 11/23/24 9045   traZODone  (DESYREL ) tablet 50 mg  50 mg Oral QHS PRN Trudy Carwin, NP   50 mg at 11/22/24 2240   Current Outpatient Medications  Medication Sig Dispense Refill   albuterol  (VENTOLIN  HFA) 108 (90 Base) MCG/ACT inhaler Inhale 2 puffs into the lungs every 6 (six) hours as needed for wheezing or shortness of breath.     alprostadil  (EDEX ) 40 MCG injection 40 mcg by Intracavitary route See admin instructions. Inject every 96 hours (every 4 days) as needed into penis. Seek medical attention for erection  lasting more than 4 hours     ammonium lactate  (LAC-HYDRIN ) 12 % lotion Apply 1 Application topically daily.     atorvastatin  (LIPITOR) 80 MG tablet Take 1 tablet (80 mg total) by mouth at bedtime. (Patient taking differently: Take 80 mg by mouth in the morning.) 30 tablet 0   Calcium  Carb-Cholecalciferol  (CALTRATE 600+D3 PO) Take 1 tablet by mouth in the morning.     docusate sodium  (COLACE) 100 MG capsule Take 1 capsule (100 mg total) by mouth daily as needed. (Patient taking differently: Take 100 mg by mouth daily.) 30 capsule 2   DULoxetine  (CYMBALTA ) 60 MG capsule Take 60 mg by mouth 2 (two) times daily.     famotidine  (PEPCID ) 20 MG tablet Take 20 mg by mouth 2 (two) times daily.     hydrocortisone  (ANUSOL -HC) 2.5 % rectal cream Place 1 Application rectally daily as needed for hemorrhoids or anal itching.     latanoprost  (XALATAN ) 0.005 % ophthalmic solution Place 1 drop into both eyes at bedtime.     mesalamine  (LIALDA ) 1.2 g EC tablet Take 4 tablets (4.8 g total) by mouth daily with breakfast. 120 tablet 1   metFORMIN  (GLUCOPHAGE -XR) 500 MG 24 hr tablet Take 500 mg by mouth every morning.     metoprolol  succinate (TOPROL -XL) 100 MG 24 hr tablet Take 100 mg by mouth daily. Take with or immediately following a meal. Hold for SBP less than 100 and/or heart rate less than 60     Multiple Vitamin (MULTIVITAMIN WITH MINERALS) TABS tablet Take 1 tablet by mouth daily. 30 tablet 0   naproxen  (NAPROSYN ) 375 MG tablet Take 375 mg by mouth 2 (two) times daily as needed (For pain).     nicotine  (NICODERM CQ  - DOSED IN MG/24 HOURS) 21 mg/24hr patch Place 21 mg onto the skin daily.     nicotine  polacrilex (COMMIT) 2 MG lozenge Take 2 mg by mouth every 2 (two) hours as needed for smoking cessation.     OLANZapine  (ZYPREXA ) 15 MG tablet Take 15 mg by mouth at bedtime.     oxyCODONE  (OXY IR/ROXICODONE ) 5 MG immediate release tablet Take 10 mg  by mouth 3 (three) times daily as needed (For pain).      pantoprazole  (PROTONIX ) 40 MG tablet Take 1 tablet (40 mg total) by mouth 2 (two) times daily. 60 tablet 0   Phenylephrine  HCl 0.25 % SUPP Place 1 suppository rectally daily as needed (For hemorrhoids).     QUEtiapine  (SEROQUEL ) 400 MG tablet Take 1,200 mg by mouth at bedtime.     sacubitril -valsartan  (ENTRESTO ) 49-51 MG Take 1 tablet by mouth 2 (two) times daily. 60 tablet 0   topiramate  (TOPAMAX ) 50 MG tablet Take 50 mg by mouth 2 (two) times daily.     Ubrogepant  (UBRELVY ) 50 MG TABS Take 50 mg by mouth daily as needed (for migraines).     PTA Medications: Prior to Admission medications  Medication Sig Start Date End Date Taking? Authorizing Provider  albuterol  (VENTOLIN  HFA) 108 (90 Base) MCG/ACT inhaler Inhale 2 puffs into the lungs every 6 (six) hours as needed for wheezing or shortness of breath.   Yes [provider]  alprostadil  (EDEX ) 40 MCG injection 40 mcg by Intracavitary route See admin instructions. Inject every 96 hours (every 4 days) as needed into penis. Seek medical attention for erection lasting more than 4 hours   Yes [provider]  ammonium lactate  (LAC-HYDRIN ) 12 % lotion Apply 1 Application topically daily. 09/08/24  Yes [provider]  atorvastatin  (LIPITOR) 80 MG tablet Take 1 tablet (80 mg total) by mouth at bedtime. Patient taking differently: Take 80 mg by mouth in the morning. 01/06/24  Yes Victoria Ruts, MD  Calcium  Carb-Cholecalciferol  (CALTRATE 600+D3 PO) Take 1 tablet by mouth in the morning.   Yes [provider]  docusate sodium  (COLACE) 100 MG capsule Take 1 capsule (100 mg total) by mouth daily as needed. Patient taking differently: Take 100 mg by mouth daily. 05/20/24 05/20/25 Yes Jule Ronal CROME, PA-C  DULoxetine  (CYMBALTA ) 60 MG capsule Take 60 mg by mouth 2 (two) times daily.   Yes [provider]  famotidine  (PEPCID ) 20 MG tablet Take 20 mg by mouth 2 (two) times daily.   Yes [provider]   hydrocortisone  (ANUSOL -HC) 2.5 % rectal cream Place 1 Application rectally daily as needed for hemorrhoids or anal itching. 09/08/24  Yes [provider]  latanoprost  (XALATAN ) 0.005 % ophthalmic solution Place 1 drop into both eyes at bedtime.   Yes [provider]  mesalamine  (LIALDA ) 1.2 g EC tablet Take 4 tablets (4.8 g total) by mouth daily with breakfast. 06/24/24  Yes Myrna Bitters, DO  metFORMIN  (GLUCOPHAGE -XR) 500 MG 24 hr tablet Take 500 mg by mouth every morning. 09/08/24  Yes [provider]  metoprolol  succinate (TOPROL -XL) 100 MG 24 hr tablet Take 100 mg by mouth daily. Take with or immediately following a meal. Hold for SBP less than 100 and/or heart rate less than 60   Yes [provider]  Multiple Vitamin (MULTIVITAMIN WITH MINERALS) TABS tablet Take 1 tablet by mouth daily. 01/18/24  Yes Howell Lunger, DO  naproxen  (NAPROSYN ) 375 MG tablet Take 375 mg by mouth 2 (two) times daily as needed (For pain).   Yes [provider]  nicotine  (NICODERM CQ  - DOSED IN MG/24 HOURS) 21 mg/24hr patch Place 21 mg onto the skin daily. 07/07/24  Yes [provider]  nicotine  polacrilex (COMMIT) 2 MG lozenge Take 2 mg by mouth every 2 (two) hours as needed for smoking cessation. 07/07/24  Yes [provider]  OLANZapine  (ZYPREXA ) 15 MG tablet Take 15  mg by mouth at bedtime. 08/31/24  Yes [provider]  oxyCODONE  (OXY IR/ROXICODONE ) 5 MG immediate release tablet Take 10 mg by mouth 3 (three) times daily as needed (For pain). 08/31/24  Yes [provider]  pantoprazole  (PROTONIX ) 40 MG tablet Take 1 tablet (40 mg total) by mouth 2 (two) times daily. 05/14/24  Yes Shitarev, Dimitry, MD  Phenylephrine  HCl 0.25 % SUPP Place 1 suppository rectally daily as needed (For hemorrhoids). 09/08/24  Yes [provider]  QUEtiapine  (SEROQUEL ) 400 MG tablet Take 1,200 mg by mouth at bedtime. 08/12/24  Yes [provider]   sacubitril -valsartan  (ENTRESTO ) 49-51 MG Take 1 tablet by mouth 2 (two) times daily. 01/07/24  Yes Victoria Ruts, MD  topiramate  (TOPAMAX ) 50 MG tablet Take 50 mg by mouth 2 (two) times daily.   Yes [provider]  Ubrogepant  (UBRELVY ) 50 MG TABS Take 50 mg by mouth daily as needed (for migraines).   Yes [provider]    Patient Stressors: Medication change or noncompliance   Substance abuse    Patient Strengths: Capable of independent living  Motivation for treatment/growth  Supportive family/friends   Treatment Modalities: Medication Management, Group therapy, Case management,  1 to 1 session with clinician, Psychoeducation, Recreational therapy.   Physician Treatment Plan for Primary and Secondary Diagnosis:  Final diagnoses:  None   Long Term Goal(s):  Long Term Goal(s): Improvement in symptoms so as ready for discharge  Short Term Goals: Patient will verbalize feelings in meetings with treatment team members. Patient will attend at least of 50% of the groups daily. Pt will complete the PHQ9 on admission, day 3 and discharge. Patient will participate in completing the [place] Suicide Severity Rating Scale Patient will score a low risk of violence for 24 hours prior to discharge Patient will take medications as prescribed daily.  Medication Management: Evaluate patient's response, side effects, and tolerance of medication regimen.  Therapeutic Interventions: 1 to 1 sessions, Unit Group sessions and Medication administration.  Evaluation of Outcomes: Progressing  LCSW Treatment Plan for Primary Diagnosis:  Final diagnoses:  None    Long Term Goal(s): Safe transition to appropriate next level of care at discharge.  Short Term Goals: Facilitate acceptance of mental health diagnosis and concerns through verbal commitment to aftercare plan and appointments at discharge. and Increase skills for wellness and recovery by attending 50% of scheduled  groups.  Therapeutic Interventions: Assess for all discharge needs, 1 to 1 time with Child psychotherapist, Explore available resources and support systems, Assess for adequacy in community support network, Educate family and significant other(s) on suicide prevention, Complete Psychosocial Assessment, Interpersonal group therapy.  Evaluation of Outcomes: Progressing   Progress in Treatment: Attending groups: Yes. Participating in groups: Yes. Taking medication as prescribed: Yes. Toleration medication: No. Family/Significant other contact made: Yes, individual(s) contacted:  wife, edwina Patient understands diagnosis: Yes. Discussing patient identified problems/goals with staff: Yes. Medical problems stabilized or resolved: Yes. Denies suicidal/homicidal ideation: Yes. Issues/concerns per patient self-inventory: No. Other: Client is requesting to complete his detox treatment and be discharged home.  Client reports he receives all of his health and mental health services through the [facility].  Client reports he may consider treatment with [facility] until he can start services with the [facility] for medication management and outpatient therapy.  Client's wife and family are supportive of his treatment.   New problem(s) identified: No, Describe:  NA  New Short Term/Long Term Goal(s):Client reports his goal is to complete detox  and attend 50% of groups while at [facility]. Client will also take medications as prescribed and follow up with recommend providers.    Patient Goals:  [facility]   Discharge Plan or Barriers: Client reports no barriers to discharge.   Reason for Continuation of Hospitalization: Medication stabilization Withdrawal symptoms  Estimated Length of Stay:11/22/24 -11/26/24  Last 3 [place] Suicide Severity Risk Score: Flowsheet Row ED from 11/22/2024 in [facility] Most recent reading at 11/23/2024 12:35  AM ED from 11/22/2024 in [facility] Most recent reading at 11/22/2024  3:50 PM ED from 11/21/2024 in [facility] Most recent reading at 11/21/2024  3:36 PM  C-SSRS RISK CATEGORY No Risk No Risk No Risk    Last PHQ 2/9 Scores:    11/23/2024   11:48 AM 06/29/2020   12:10 PM  Depression screen PHQ 2/9  Decreased Interest 3 2  Down, Depressed, Hopeless 2 2  PHQ - 2 Score 5 4  Altered sleeping 2 3  Tired, decreased energy 2 2  Change in appetite 2 3  Feeling bad or failure about yourself  3 3  Trouble concentrating 3 3  Moving slowly or fidgety/restless 0 0  Suicidal thoughts 0 1  PHQ-9 Score 17 19   Difficult doing work/chores Somewhat difficult Somewhat difficult     Data saved with a previous flowsheet row definition    Scribe for Treatment Team: Khaniya Tenaglia, LCSW 11/23/2024 3:10 PM

## 2024-11-23 NOTE — ED Notes (Signed)
 48 Y/0 AA Male admitted to [facility] with a diagnosis to include, Alcoholism.  Presents with a flat affect able to answer questions appropriately . No AVH  Pt states, some times I hear voices and Sometimes I see things, but I am not experiencing anything now.  .  Pt denies thoughts of self harm or SI. Pt without signs of withdrawals. Pt requesting a sedative.  Trazodone   50 mg given. Upon nurse patient interview pt denies having a BM x 4 days due to lack of appetite.  Upon admission pt requested something to eat and stated I had something to eat earlier but , Im still hungry. i Needs anticipated. Safety maintained.

## 2024-11-23 NOTE — ED Provider Notes (Signed)
 Behavioral Health Progress Note  Date and Time: 11/23/2024 5:46 PM Name: Jacob Adams MRN:  [id number]  Subjective: Patient states I have been drinking a lot.  Since I relapsed I felt depressed more and more.  I want to stop drinking.  Jacob Adams is a 59 year old male who presented voluntarily to [facility] behavioral health on 11/22/2024.  Patient history includes MDD, schizophrenia, alcohol use disorder.  Patient presented with request for detox from alcohol.  Patient endorsed drinking up to 15 beers and 1/5 of liquor daily for 1 week.  Patient [facility] alcohol use treatment program in October 2025.  22-month sobriety prior to relapse 1 week ago.  He also endorsed auditory and visual hallucinations of demons upon initial presentation.  Chart reviewed and patient reassessed by this nurse practitioner face-to-face on 11/23/2024.  Patient is seated in dining area upon my approach.  He is alert and oriented,  cooperative during assessment.  Patient presents with depressed mood, flat affect.  Jacob Adams denies symptoms alcohol withdrawal currently.  He denies cravings/urges to use alcohol.  CIWA today measures 0.  Patient describes mood as I am just here.  Low energy level, average sleep.  Patient reports appetite is good.  Patient is compliant with medications.  Jacob Adams endorses auditory hallucinations.  States I hear noises all the time, for years, it sounds like voices that scream.  He denies visual hallucinations, denies command hallucinations.  He denies SI/HI.  There is no indication that patient is responding to internal stimuli currently.  Denies symptoms of paranoia.    Jacob Adams is followed for outpatient psychiatric treatment at the [facility].  He reports compliance with medications.  Individual counseling provider pending, previously followed by individual counseling provider.  Current medications include olanzapine  15 mg nightly/mood.  Previous medication  trials include quetiapine  and aripiprazole .  Patient offered support and encouragement.  Reviewed treatment plan including residential substance use treatment following facility based crisis unit stay.  Patient verbalized understanding and agreement with plan.    Diagnosis:  Final diagnoses:  Alcohol use disorder, severe, dependence (HCC)  MDD (major depressive disorder), recurrent, severe, [name]    Total Time spent with patient: 20 minutes  Past Psychiatric History: Alcohol use disorder, schizophrenia, MDD Past Medical History:  Past Medical History:  Diagnosis Date   Acid reflux    Alcohol withdrawal (HCC) 01/2019   Anxiety    Arthritis    toes (07/26/2014)   CHF (congestive heart failure) (HCC)    Depression    Diabetes mellitus without complication (HCC)    Headache(784.0)    weekly (07/26/2014)   History of blood transfusion ~ 2000   related to nose bleeding   History of stomach ulcers    Hypertension    Lower GI bleeding admitted 07/26/2014   Mental disorder    Migraine    @ least monthly (07/26/2014)   Pancreatitis    Rectal bleeding 07/26/2014   Sleep apnea    haven't been RX'd mask yet (07/26/2014)    Family History: None reported Family Psychiatric  History: Father: alcoholism. Brother: alcoholism. Social History: Patient resides in [place] with his son, son's girlfriend and grandson.  He receives disability income.  [facility] case manager has located an employment opportunity for patient, scheduled to start soon.  He denies access to weapons.  Patient identifies his wife as an emotional support, she does not reside in the home.  Additional Social History:  Sleep: Fair  Appetite:  Good  Current Medications:  Current Facility-Administered Medications  Medication Dose Route Frequency Provider Last Rate Last Admin   acetaminophen  (TYLENOL ) tablet 650 mg  650 mg Oral Q6H PRN Trudy Carwin, NP        albuterol  (VENTOLIN  HFA) 108 (90 Base) MCG/ACT inhaler 2 puff  2 puff Inhalation Q6H PRN Trudy Carwin, NP       alum & mag hydroxide-simeth (MAALOX/MYLANTA) 200-200-20 MG/5ML suspension 30 mL  30 mL Oral Q4H PRN Trudy Carwin, NP       atorvastatin  (LIPITOR) tablet 80 mg  80 mg Oral QHS Trudy Carwin, NP   80 mg at 11/23/24 0005   [START ON 11/24/2024] calcium -vitamin D  (OSCAL WITH D) 500-5 MG-MCG per tablet 1 tablet  1 tablet Oral Q lunch Nkwenti, Doris, NP       haloperidol  (HALDOL ) tablet 5 mg  5 mg Oral TID PRN Trudy Carwin, NP       And   diphenhydrAMINE  (BENADRYL ) capsule 50 mg  50 mg Oral TID PRN Trudy Carwin, NP       haloperidol  lactate (HALDOL ) injection 5 mg  5 mg Intramuscular TID PRN Trudy Carwin, NP       And   diphenhydrAMINE  (BENADRYL ) injection 50 mg  50 mg Intramuscular TID PRN Trudy Carwin, NP       And   LORazepam  (ATIVAN ) injection 2 mg  2 mg Intramuscular TID PRN Trudy Carwin, NP       haloperidol  lactate (HALDOL ) injection 10 mg  10 mg Intramuscular TID PRN Trudy Carwin, NP       And   diphenhydrAMINE  (BENADRYL ) injection 50 mg  50 mg Intramuscular TID PRN Trudy Carwin, NP       And   LORazepam  (ATIVAN ) injection 2 mg  2 mg Intramuscular TID PRN Trudy Carwin, NP       docusate sodium  (COLACE) capsule 100 mg  100 mg Oral Daily PRN Trudy Carwin, NP       DULoxetine  (CYMBALTA ) DR capsule 60 mg  60 mg Oral BID Nkwenti, Doris, NP       famotidine  (PEPCID ) tablet 20 mg  20 mg Oral BID Nkwenti, Doris, NP       folic acid  (FOLVITE ) tablet 1 mg  1 mg Oral Daily Trudy Carwin, NP   1 mg at 11/23/24 9045   gabapentin  (NEURONTIN ) capsule 300 mg  300 mg Oral TID PRN Trudy Carwin, NP       hydrOXYzine  (ATARAX ) tablet 25 mg  25 mg Oral Q6H PRN Trudy Carwin, NP   25 mg at 11/22/24 2240   latanoprost  (XALATAN ) 0.005 % ophthalmic solution 1 drop  1 drop Both Eyes Daily Trudy Carwin, NP   1 drop at 11/23/24 1026   levothyroxine  (SYNTHROID ) tablet 88 mcg  88 mcg Oral Q0600  Trudy Carwin, NP   88 mcg at 11/23/24 0636   loperamide  (IMODIUM ) capsule 2-4 mg  2-4 mg Oral PRN Trudy Carwin, NP       LORazepam  (ATIVAN ) tablet 1 mg  1 mg Oral Q6H PRN Trudy Carwin, NP       magnesium  hydroxide (MILK OF MAGNESIA) suspension 30 mL  30 mL Oral Daily PRN Trudy Carwin, NP       NOREEN ON 11/24/2024] mesalamine  (LIALDA ) EC tablet 4.8 g  4.8 g Oral Q breakfast Nkwenti, Doris, NP       metFORMIN  (GLUCOPHAGE -XR) 24 hr tablet 500 mg  500 mg Oral Q breakfast Nkwenti, Doris,  NP   500 mg at 11/23/24 1604   [START ON 11/24/2024] metoprolol  succinate (TOPROL -XL) 24 hr tablet 100 mg  100 mg Oral Daily Tex Drilling, NP       multivitamin with minerals tablet 1 tablet  1 tablet Oral Daily Trudy Carwin, NP   1 tablet at 11/23/24 9046   nicotine  (NICODERM CQ  - dosed in mg/24 hours) patch 21 mg  21 mg Transdermal Daily Nkwenti, Doris, NP   21 mg at 11/23/24 1604   OLANZapine  (ZYPREXA ) tablet 15 mg  15 mg Oral QHS Nkwenti, Doris, NP       ondansetron  (ZOFRAN -ODT) disintegrating tablet 4 mg  4 mg Oral Q6H PRN Trudy Carwin, NP       pantoprazole  (PROTONIX ) EC tablet 40 mg  40 mg Oral BID Nkwenti, Doris, NP       sacubitril -valsartan  (ENTRESTO ) 49-51 mg per tablet  1 tablet Oral BID Tex Drilling, NP       thiamine  (VITAMIN B1) tablet 100 mg  100 mg Oral Daily Trudy Carwin, NP   100 mg at 11/23/24 9045   topiramate  (TOPAMAX ) tablet 50 mg  50 mg Oral BID Tex Drilling, NP   50 mg at 11/23/24 1604   traZODone  (DESYREL ) tablet 50 mg  50 mg Oral QHS PRN Trudy Carwin, NP   50 mg at 11/22/24 2240   Current Outpatient Medications  Medication Sig Dispense Refill   albuterol  (VENTOLIN  HFA) 108 (90 Base) MCG/ACT inhaler Inhale 2 puffs into the lungs every 6 (six) hours as needed for wheezing or shortness of breath.     alprostadil  (EDEX ) 40 MCG injection 40 mcg by Intracavitary route See admin instructions. Inject every 96 hours (every 4 days) as needed into penis. Seek medical attention for erection  lasting more than 4 hours     ammonium lactate  (LAC-HYDRIN ) 12 % lotion Apply 1 Application topically daily.     atorvastatin  (LIPITOR) 80 MG tablet Take 1 tablet (80 mg total) by mouth at bedtime. (Patient taking differently: Take 80 mg by mouth in the morning.) 30 tablet 0   Calcium  Carb-Cholecalciferol  (CALTRATE 600+D3 PO) Take 1 tablet by mouth in the morning.     docusate sodium  (COLACE) 100 MG capsule Take 1 capsule (100 mg total) by mouth daily as needed. (Patient taking differently: Take 100 mg by mouth daily.) 30 capsule 2   DULoxetine  (CYMBALTA ) 60 MG capsule Take 60 mg by mouth 2 (two) times daily.     famotidine  (PEPCID ) 20 MG tablet Take 20 mg by mouth 2 (two) times daily.     hydrocortisone  (ANUSOL -HC) 2.5 % rectal cream Place 1 Application rectally daily as needed for hemorrhoids or anal itching.     latanoprost  (XALATAN ) 0.005 % ophthalmic solution Place 1 drop into both eyes at bedtime.     levothyroxine  (SYNTHROID ) 88 MCG tablet Take 88 mcg by mouth daily before breakfast.     mesalamine  (LIALDA ) 1.2 g EC tablet Take 4 tablets (4.8 g total) by mouth daily with breakfast. 120 tablet 1   metFORMIN  (GLUCOPHAGE -XR) 500 MG 24 hr tablet Take 500 mg by mouth every morning.     metoprolol  succinate (TOPROL -XL) 100 MG 24 hr tablet Take 100 mg by mouth daily. Take with or immediately following a meal. Hold for SBP less than 100 and/or heart rate less than 60     Multiple Vitamin (MULTIVITAMIN WITH MINERALS) TABS tablet Take 1 tablet by mouth daily. 30 tablet 0   naproxen  (NAPROSYN ) 375 MG tablet  Take 375 mg by mouth 2 (two) times daily as needed (For pain).     nicotine  (NICODERM CQ  - DOSED IN MG/24 HOURS) 21 mg/24hr patch Place 21 mg onto the skin daily.     nicotine  polacrilex (COMMIT) 2 MG lozenge Take 2 mg by mouth every 2 (two) hours as needed for smoking cessation.     OLANZapine  (ZYPREXA ) 15 MG tablet Take 15 mg by mouth at bedtime.     oxyCODONE  (OXY IR/ROXICODONE ) 5 MG immediate  release tablet Take 10 mg by mouth 3 (three) times daily as needed (For pain).     pantoprazole  (PROTONIX ) 40 MG tablet Take 1 tablet (40 mg total) by mouth 2 (two) times daily. 60 tablet 0   Phenylephrine  HCl 0.25 % SUPP Place 1 suppository rectally daily as needed (For hemorrhoids).     sacubitril -valsartan  (ENTRESTO ) 49-51 MG Take 1 tablet by mouth 2 (two) times daily. 60 tablet 0   topiramate  (TOPAMAX ) 50 MG tablet Take 50 mg by mouth 2 (two) times daily.     Ubrogepant  (UBRELVY ) 50 MG TABS Take 50 mg by mouth daily as needed (for migraines).      Labs  Lab Results:  Admission on 11/22/2024  Component Date Value Ref Range Status   Glucose-Capillary 11/22/2024 111 (H)  70 - 99 mg/dL Final   Glucose reference range applies only to samples taken after fasting for at least 8 hours.  Admission on 11/22/2024, Discharged on 11/22/2024  Component Date Value Ref Range Status   Sodium 11/22/2024 136  135 - 145 mmol/L Final   Potassium 11/22/2024 4.6  3.5 - 5.1 mmol/L Final   Chloride 11/22/2024 99  98 - 111 mmol/L Final   CO2 11/22/2024 19 (L)  22 - 32 mmol/L Final   Glucose, Bld 11/22/2024 84  70 - 99 mg/dL Final   Glucose reference range applies only to samples taken after fasting for at least 8 hours.   BUN 11/22/2024 12  6 - 20 mg/dL Final   Creatinine, Ser 11/22/2024 1.03  0.61 - 1.24 mg/dL Final   Calcium  11/22/2024 9.3  8.9 - 10.3 mg/dL Final   Total Protein 97/96/7973 8.3 (H)  6.5 - 8.1 g/dL Final   Albumin 97/96/7973 4.8  3.5 - 5.0 g/dL Final   AST 97/96/7973 40  15 - 41 U/L Final   HEMOLYSIS AT THIS LEVEL MAY AFFECT RESULT   ALT 11/22/2024 32  0 - 44 U/L Final   Alkaline Phosphatase 11/22/2024 84  38 - 126 U/L Final   Total Bilirubin 11/22/2024 0.5  0.0 - 1.2 mg/dL Final   GFR, Estimated 11/22/2024 >60  >60 mL/min Final   Comment: (NOTE) Calculated using the CKD-EPI Creatinine Equation (2021)    Anion gap 11/22/2024 19 (H)  5 - 15 Final   Performed at [facility] Lab,  [address] [place], [place] [address]   Hgb A1c MFr Bld 11/22/2024 6.1 (H)  4.8 - 5.6 % Final   Comment: (NOTE) Diagnosis of Diabetes The following HbA1c ranges recommended by the American Diabetes Association (ADA) may be used as an aid in the diagnosis of diabetes mellitus.  Hemoglobin             Suggested A1C NGSP%              Diagnosis  <5.7                   Non Diabetic  5.7-6.4  Pre-Diabetic  >6.4                   Diabetic  <7.0                   Glycemic control for                       adults with diabetes.     Mean Plasma Glucose 11/22/2024 128.37  mg/dL Final   Performed at [facility] Lab, [address] [place] [address], [place], [place] [address]   Magnesium  11/22/2024 2.3  1.7 - 2.4 mg/dL Final   Performed at [facility] Lab, [address], [place], [place] [address]   Alcohol, Ethyl (B) 11/22/2024 52 (H)  <15 mg/dL Final   Comment: (NOTE) For medical purposes only. Performed at [facility], [address], [place], [place] [address]    Cholesterol 11/22/2024 201 (H)  0 - 200 mg/dL Final   Comment:        ATP III CLASSIFICATION:  <200     mg/dL   Desirable  799-760  mg/dL   Borderline High  >=759    mg/dL   High           Triglycerides 11/22/2024 132  <150 mg/dL Final   HDL 97/96/7973 80  >40 mg/dL Final   Total CHOL/HDL Ratio 11/22/2024 2.5  RATIO Final   VLDL 11/22/2024 26  0 - 40 mg/dL Final   LDL Cholesterol 11/22/2024 95  0 - 99 mg/dL Final   Comment:        Total Cholesterol/HDL:CHD Risk Coronary Heart Disease Risk Table                     Men   Women  1/2 Average Risk   3.4   3.3  Average Risk       5.0   4.4  2 X Average Risk   9.6   7.1  3 X Average Risk  23.4   11.0        Use the calculated Patient Ratio above and the CHD Risk Table to determine the patient's CHD Risk.        ATP III CLASSIFICATION (LDL):  <100     mg/dL   Optimal  899-870  mg/dL   Near or Above                    Optimal  130-159  mg/dL    Borderline  839-810  mg/dL   High  >809     mg/dL   Very High Performed at [facility] Lab, [address], [place], [place] [address]    TSH 11/22/2024 0.753  0.350 - 4.500 uIU/mL Final   Performed at [facility] Lab, [address], [place], [place] [address]   Color, Urine 11/22/2024 YELLOW  YELLOW Final   APPearance 11/22/2024 CLEAR  CLEAR Final   Specific Gravity, Urine 11/22/2024 1.009  1.005 - 1.030 Final   pH 11/22/2024 5.0  5.0 - 8.0 Final   Glucose, UA 11/22/2024 >=500 (A)  NEGATIVE mg/dL Final   Hgb urine dipstick 11/22/2024 SMALL (A)  NEGATIVE Final   Bilirubin Urine 11/22/2024 NEGATIVE  NEGATIVE Final   Ketones, ur 11/22/2024 5 (A)  NEGATIVE mg/dL Final   Protein, ur 97/96/7973 100 (A)  NEGATIVE mg/dL Final   Nitrite 97/96/7973 NEGATIVE  NEGATIVE Final   Leukocytes,Ua 11/22/2024 NEGATIVE  NEGATIVE Final   RBC / HPF 11/22/2024 0-5  0 - 5 RBC/hpf Final   WBC, UA 11/22/2024 0-5  0 - 5 WBC/hpf Final   Bacteria, UA 11/22/2024 NONE SEEN  NONE SEEN Final   Squamous Epithelial / HPF 11/22/2024 0-5  0 - 5 /HPF Final   Mucus 11/22/2024 PRESENT   Final   Hyaline Casts, UA 11/22/2024 PRESENT   Final   Performed at [facility] Lab, [address] [place], [place] [address]   POC Amphetamine UR 11/22/2024 None Detected  NONE DETECTED (Cut Off Level 1000 ng/mL) Final   POC Secobarbital (BAR) 11/22/2024 None Detected  NONE DETECTED (Cut Off Level 300 ng/mL) Final   POC Buprenorphine (BUP) 11/22/2024 None Detected  NONE DETECTED (Cut Off Level 10 ng/mL) Final   POC Oxazepam (BZO) 11/22/2024 None Detected  NONE DETECTED (Cut Off Level 300 ng/mL) Final   POC Cocaine UR 11/22/2024 None Detected  NONE DETECTED (Cut Off Level 300 ng/mL) Final   POC Methamphetamine UR 11/22/2024 None Detected  NONE DETECTED (Cut Off Level 1000 ng/mL) Final   POC Morphine  11/22/2024 None Detected  NONE DETECTED (Cut Off Level 300 ng/mL) Final   POC Methadone UR 11/22/2024 None Detected  NONE DETECTED  (Cut Off Level 300 ng/mL) Final   POC Oxycodone  UR 11/22/2024 Positive (A)  NONE DETECTED (Cut Off Level 100 ng/mL) Final   POC Marijuana UR 11/22/2024 None Detected  NONE DETECTED (Cut Off Level 50 ng/mL) Final  Admission on 06/20/2024, Discharged on 06/24/2024  Component Date Value Ref Range Status   WBC 06/20/2024 3.7 (L)  4.0 - 10.5 K/uL Final   RBC 06/20/2024 3.66 (L)  4.22 - 5.81 MIL/uL Final   Hemoglobin 06/20/2024 12.2 (L)  13.0 - 17.0 g/dL Final   HCT 90/98/7974 35.6 (L)  39.0 - 52.0 % Final   MCV 06/20/2024 97.3  80.0 - 100.0 fL Final   MCH 06/20/2024 33.3  26.0 - 34.0 pg Final   MCHC 06/20/2024 34.3  30.0 - 36.0 g/dL Final   RDW 90/98/7974 13.8  11.5 - 15.5 % Final   Platelets 06/20/2024 343  150 - 400 K/uL Final   nRBC 06/20/2024 0.0  0.0 - 0.2 % Final   Neutrophils Relative % 06/20/2024 62  % Final   Neutro Abs 06/20/2024 2.4  1.7 - 7.7 K/uL Final   Lymphocytes Relative 06/20/2024 23  % Final   Lymphs Abs 06/20/2024 0.8  0.7 - 4.0 K/uL Final   Monocytes Relative 06/20/2024 12  % Final   Monocytes Absolute 06/20/2024 0.4  0.1 - 1.0 K/uL Final   Eosinophils Relative 06/20/2024 0  % Final   Eosinophils Absolute 06/20/2024 0.0  0.0 - 0.5 K/uL Final   Basophils Relative 06/20/2024 2  % Final   Basophils Absolute 06/20/2024 0.1  0.0 - 0.1 K/uL Final   Immature Granulocytes 06/20/2024 1  % Final   Abs Immature Granulocytes 06/20/2024 0.02  0.00 - 0.07 K/uL Final   Performed at [facility], [address], [place], [place] [address]   Sodium 06/20/2024 134 (L)  135 - 145 mmol/L Final   Potassium 06/20/2024 4.1  3.5 - 5.1 mmol/L Final   Chloride 06/20/2024 98  98 - 111 mmol/L Final   CO2 06/20/2024 20 (L)  22 - 32 mmol/L Final   Glucose, Bld 06/20/2024 92  70 - 99 mg/dL Final   Glucose reference range applies only to samples taken after fasting for at least 8  hours.   BUN 06/20/2024 6  6 - 20 mg/dL Final   Creatinine, Ser 06/20/2024 0.84  0.61 - 1.24 mg/dL Final    Calcium  06/20/2024 8.9  8.9 - 10.3 mg/dL Final   Total Protein 90/98/7974 8.2 (H)  6.5 - 8.1 g/dL Final   Albumin 90/98/7974 4.1  3.5 - 5.0 g/dL Final   AST 90/98/7974 32  15 - 41 U/L Final   ALT 06/20/2024 15  0 - 44 U/L Final   Alkaline Phosphatase 06/20/2024 88  38 - 126 U/L Final   Total Bilirubin 06/20/2024 1.0  0.0 - 1.2 mg/dL Final   GFR, Estimated 06/20/2024 >60  >60 mL/min Final   Comment: (NOTE) Calculated using the CKD-EPI Creatinine Equation (2021)    Anion gap 06/20/2024 16 (H)  5 - 15 Final   Performed at [facility] Lab, [address] [place], [place] [address]   ABO/RH(D) 06/20/2024 O POS   Final   Antibody Screen 06/20/2024 NEG   Final   Sample Expiration 06/20/2024    Final                   Value:06/23/2024,2359 Performed at [facility], [address], [place], [place] [address]    Prothrombin Time 06/20/2024 13.5  11.4 - 15.2 seconds Final   INR 06/20/2024 1.0  0.8 - 1.2 Final   Comment: (NOTE) INR goal varies based on device and disease states. Performed at [facility], [address] [place], [place] [address]    Color, Urine 06/20/2024 STRAW (A)  YELLOW Final   APPearance 06/20/2024 CLEAR  CLEAR Final   Specific Gravity, Urine 06/20/2024 1.005  1.005 - 1.030 Final   pH 06/20/2024 7.0  5.0 - 8.0 Final   Glucose, UA 06/20/2024 NEGATIVE  NEGATIVE mg/dL Final   Hgb urine dipstick 06/20/2024 NEGATIVE  NEGATIVE Final   Bilirubin Urine 06/20/2024 NEGATIVE  NEGATIVE Final   Ketones, ur 06/20/2024 5 (A)  NEGATIVE mg/dL Final   Protein, ur 90/98/7974 100 (A)  NEGATIVE mg/dL Final   Nitrite 90/98/7974 NEGATIVE  NEGATIVE Final   Leukocytes,Ua 06/20/2024 NEGATIVE  NEGATIVE Final   RBC / HPF 06/20/2024 0-5  0 - 5 RBC/hpf Final   WBC, UA 06/20/2024 0-5  0 - 5 WBC/hpf Final   Bacteria, UA 06/20/2024 NONE SEEN  NONE SEEN Final   Squamous Epithelial / HPF 06/20/2024 0-5  0 - 5 /HPF Final   Mucus 06/20/2024 PRESENT   Final   Performed at [facility] Lab, [address], [place], [place] [address]   Alcohol, Ethyl (B) 06/20/2024 <15  <15 mg/dL Final   Comment: (NOTE) For medical purposes only. Performed at [facility], [address], [place], [place] [address]    Ammonia 06/20/2024 <13  9 - 35 umol/L Final   Performed at [facility], [address], [place], [place] [address]   Opiates 06/20/2024 NONE DETECTED  NONE DETECTED Final   Cocaine 06/20/2024 NONE DETECTED  NONE DETECTED Final   Benzodiazepines 06/20/2024 NONE DETECTED  NONE DETECTED Final   Amphetamines 06/20/2024 NONE DETECTED  NONE DETECTED Final   Tetrahydrocannabinol 06/20/2024 NONE DETECTED  NONE DETECTED Final   Barbiturates 06/20/2024 NONE DETECTED  NONE DETECTED Final   Comment: (NOTE) DRUG SCREEN FOR MEDICAL PURPOSES ONLY.  IF CONFIRMATION IS NEEDED FOR ANY PURPOSE, NOTIFY LAB WITHIN 5 DAYS.  LOWEST DETECTABLE LIMITS FOR URINE DRUG SCREEN Drug Class  Cutoff (ng/mL) Amphetamine and metabolites    1000 Barbiturate and metabolites    200 Benzodiazepine                 200 Opiates and metabolites        300 Cocaine and metabolites        300 THC                            50 Performed at [facility] Lab, [address], [place], [place] [address]    Sodium 06/20/2024 135  135 - 145 mmol/L Final   Potassium 06/20/2024 3.8  3.5 - 5.1 mmol/L Final   Chloride 06/20/2024 103  98 - 111 mmol/L Final   BUN 06/20/2024 6  6 - 20 mg/dL Final   Creatinine, Ser 06/20/2024 0.80  0.61 - 1.24 mg/dL Final   Glucose, Bld 90/98/7974 90  70 - 99 mg/dL Final   Glucose reference range applies only to samples taken after fasting for at least 8 hours.   Calcium , Ion 06/20/2024 1.01 (L)  1.15 - 1.40 mmol/L Final   TCO2 06/20/2024 19 (L)  22 - 32 mmol/L Final   Hemoglobin 06/20/2024 12.9 (L)  13.0 - 17.0 g/dL Final   HCT 90/98/7974 38.0 (L)  39.0 - 52.0 % Final   Calprotectin, Fecal 06/20/2024 22  0 - 120 ug/g Final   Comment:  (NOTE) Concentration     Interpretation   Follow-Up < 5 - 50 ug/g     Normal           None >50 -120 ug/g     Borderline       Re-evaluate in 4-6 weeks    >120 ug/g     Abnormal         Repeat as clinically                                   indicated Performed At: [facility] [address] [place], [place] [id number] Jennette Shorter MD Ey:[id number]    Magnesium  06/20/2024 1.7  1.7 - 2.4 mg/dL Final   Performed at [facility] Lab, [address], [place], [place] [address]   Phosphorus 06/20/2024 2.7  2.5 - 4.6 mg/dL Final   Performed at [facility], [address], [place], [place] [address]   LDH 06/20/2024 220 (H)  98 - 192 U/L Final   Performed at [facility] Lab, [address], [place], [place] [address]   WBC 06/21/2024 4.8  4.0 - 10.5 K/uL Final   RBC 06/21/2024 3.20 (L)  4.22 - 5.81 MIL/uL Final   Hemoglobin 06/21/2024 10.6 (L)  13.0 - 17.0 g/dL Final   HCT 90/97/7974 31.8 (L)  39.0 - 52.0 % Final   MCV 06/21/2024 99.4  80.0 - 100.0 fL Final   MCH 06/21/2024 33.1  26.0 - 34.0 pg Final   MCHC 06/21/2024 33.3  30.0 - 36.0 g/dL Final   RDW 90/97/7974 14.0  11.5 - 15.5 % Final   Platelets 06/21/2024 270  150 - 400 K/uL Final   nRBC 06/21/2024 0.0  0.0 - 0.2 % Final   Performed at [facility] Lab, [address] [place], [place] [address]   Sodium 06/21/2024 135  135 - 145 mmol/L Final   Potassium 06/21/2024 3.4 (L)  3.5 - 5.1 mmol/L Final  Chloride 06/21/2024 102  98 - 111 mmol/L Final   CO2 06/21/2024 19 (L)  22 - 32 mmol/L Final   Glucose, Bld 06/21/2024 160 (H)  70 - 99 mg/dL Final   Glucose reference range applies only to samples taken after fasting for at least 8 hours.   BUN 06/21/2024 11  6 - 20 mg/dL Final   Creatinine, Ser 06/21/2024 1.39 (H)  0.61 - 1.24 mg/dL Final   Calcium  06/21/2024 8.3 (L)  8.9 - 10.3 mg/dL Final   GFR, Estimated 06/21/2024 58 (L)  >60 mL/min Final   Comment: (NOTE) Calculated using the CKD-EPI Creatinine Equation  (2021)    Anion gap 06/21/2024 14  5 - 15 Final   Performed at [facility] Lab, [address], [place], [place] [address]   Iron 06/21/2024 111  45 - 182 ug/dL Final   TIBC 90/97/7974 309  250 - 450 ug/dL Final   Saturation Ratios 06/21/2024 36  17.9 - 39.5 % Final   UIBC 06/21/2024 198  ug/dL Final   Performed at [facility], [address], [place], [place] [address]   Ferritin 06/21/2024 46  24 - 336 ng/mL Final   Performed at [facility], [address], [place], [place] [address]   TSH 06/21/2024 5.705 (H)  0.350 - 4.500 uIU/mL Final   Comment: Performed by a 3rd Generation assay with a functional sensitivity of <=0.01 uIU/mL. Performed at [facility], [address], [place], [place] [address]    CRP 06/21/2024 0.5  <1.0 mg/dL Final   Performed at [facility], [address] [place], [place] [address]   Glucose-Capillary 06/20/2024 161 (H)  70 - 99 mg/dL Final   Glucose reference range applies only to samples taken after fasting for at least 8 hours.   WBC 06/22/2024 5.1  4.0 - 10.5 K/uL Final   RBC 06/22/2024 2.88 (L)  4.22 - 5.81 MIL/uL Final   Hemoglobin 06/22/2024 9.6 (L)  13.0 - 17.0 g/dL Final   HCT 90/96/7974 28.7 (L)  39.0 - 52.0 % Final   MCV 06/22/2024 99.7  80.0 - 100.0 fL Final   MCH 06/22/2024 33.3  26.0 - 34.0 pg Final   MCHC 06/22/2024 33.4  30.0 - 36.0 g/dL Final   RDW 90/96/7974 14.2  11.5 - 15.5 % Final   Platelets 06/22/2024 268  150 - 400 K/uL Final   nRBC 06/22/2024 0.0  0.0 - 0.2 % Final   Performed at [facility], [address], [place], [place] [address]   Sodium 06/22/2024 138  135 - 145 mmol/L Final   Potassium 06/22/2024 4.2  3.5 - 5.1 mmol/L Final   Chloride 06/22/2024 109  98 - 111 mmol/L Final   CO2 06/22/2024 21 (L)  22 - 32 mmol/L Final   Glucose, Bld 06/22/2024 98  70 - 99 mg/dL Final   Glucose reference range applies only to samples taken after fasting for at least 8 hours.   BUN 06/22/2024 17  6 - 20  mg/dL Final   Creatinine, Ser 06/22/2024 1.11  0.61 - 1.24 mg/dL Final   Calcium  06/22/2024 8.1 (L)  8.9 - 10.3 mg/dL Final   Phosphorus 90/96/7974 2.6  2.5 - 4.6 mg/dL Final   Albumin 90/96/7974 3.2 (L)  3.5 - 5.0 g/dL Final   GFR, Estimated 06/22/2024 >60  >60 mL/min Final   Comment: (NOTE) Calculated using the CKD-EPI Creatinine Equation (2021)  Anion gap 06/22/2024 8  5 - 15 Final   Performed at [facility], [address], [place], [place] [address]   WBC 06/23/2024 5.9  4.0 - 10.5 K/uL Final   RBC 06/23/2024 2.85 (L)  4.22 - 5.81 MIL/uL Final   Hemoglobin 06/23/2024 9.5 (L)  13.0 - 17.0 g/dL Final   HCT 90/95/7974 29.0 (L)  39.0 - 52.0 % Final   MCV 06/23/2024 101.8 (H)  80.0 - 100.0 fL Final   MCH 06/23/2024 33.3  26.0 - 34.0 pg Final   MCHC 06/23/2024 32.8  30.0 - 36.0 g/dL Final   RDW 90/95/7974 14.3  11.5 - 15.5 % Final   Platelets 06/23/2024 269  150 - 400 K/uL Final   nRBC 06/23/2024 0.0  0.0 - 0.2 % Final   Performed at [facility], [address], [place], [place] [address]   Sodium 06/23/2024 140  135 - 145 mmol/L Final   Potassium 06/23/2024 3.7  3.5 - 5.1 mmol/L Final   Chloride 06/23/2024 108  98 - 111 mmol/L Final   CO2 06/23/2024 20 (L)  22 - 32 mmol/L Final   Glucose, Bld 06/23/2024 107 (H)  70 - 99 mg/dL Final   Glucose reference range applies only to samples taken after fasting for at least 8 hours.   BUN 06/23/2024 11  6 - 20 mg/dL Final   Creatinine, Ser 06/23/2024 0.88  0.61 - 1.24 mg/dL Final   Calcium  06/23/2024 8.2 (L)  8.9 - 10.3 mg/dL Final   GFR, Estimated 06/23/2024 >60  >60 mL/min Final   Comment: (NOTE) Calculated using the CKD-EPI Creatinine Equation (2021)    Anion gap 06/23/2024 12  5 - 15 Final   Performed at [facility], [address], [place], [place] [address]   WBC 06/24/2024 6.1  4.0 - 10.5 K/uL Final   RBC 06/24/2024 3.10 (L)  4.22 - 5.81 MIL/uL Final   Hemoglobin 06/24/2024 10.2 (L)  13.0 - 17.0 g/dL Final    HCT 90/94/7974 32.2 (L)  39.0 - 52.0 % Final   MCV 06/24/2024 103.9 (H)  80.0 - 100.0 fL Final   MCH 06/24/2024 32.9  26.0 - 34.0 pg Final   MCHC 06/24/2024 31.7  30.0 - 36.0 g/dL Final   RDW 90/94/7974 14.6  11.5 - 15.5 % Final   Platelets 06/24/2024 298  150 - 400 K/uL Final   nRBC 06/24/2024 0.0  0.0 - 0.2 % Final   Performed at [facility], [address] [place], [place] [address]   Sodium 06/24/2024 140  135 - 145 mmol/L Final   Potassium 06/24/2024 3.8  3.5 - 5.1 mmol/L Final   Chloride 06/24/2024 108  98 - 111 mmol/L Final   CO2 06/24/2024 19 (L)  22 - 32 mmol/L Final   Glucose, Bld 06/24/2024 101 (H)  70 - 99 mg/dL Final   Glucose reference range applies only to samples taken after fasting for at least 8 hours.   BUN 06/24/2024 6  6 - 20 mg/dL Final   Creatinine, Ser 06/24/2024 0.92  0.61 - 1.24 mg/dL Final   Calcium  06/24/2024 8.6 (L)  8.9 - 10.3 mg/dL Final   GFR, Estimated 06/24/2024 >60  >60 mL/min Final   Comment: (NOTE) Calculated using the CKD-EPI Creatinine Equation (2021)    Anion gap 06/24/2024 13  5 - 15 Final   Performed at [facility], [address], [place], [place] [address]  SURGICAL PATHOLOGY 06/24/2024    Final-Edited                   Value:SURGICAL PATHOLOGY CASE: [id number] PATIENT: NORLEEN Bari Surgical Pathology Report     Clinical History: GI bleeding, R/O ulcerative colitis, dysplasia (cm)     FINAL MICROSCOPIC DIAGNOSIS:  A. COLON, RIGHT, BIOPSY: - Unremarkable colonic mucosa. - Negative for granulomas, dysplasia, and malignancy.  B. COLON POLYP; POLYPECTOMY: - Tubular adenoma. - Negative for high-grade dysplasia and malignancy.  C. COLON, LEFT, BIOPSY: - Unremarkable colonic mucosa. - Negative for granulomas, dysplasia, and malignancy.  GROSS DESCRIPTION:  A. Received in formalin labeled with the patients name and Right colon is a 1.5 x 1.3 x 0.2 cm aggregate of tan soft tissue fragments, submitted in toto  in a single cassette.  B. Received in formalin labeled with the patients name and Colon polyp is a 0.5 cm piece of tan soft tissue, submitted in toto in a single cassette.  C. Received in formalin labeled with the patients name and Left colon bx is a 1.7 x 1.5 x 0.2                          cm aggregate of tan soft tissue fragments, submitted in toto in a single cassette.  (LEF 06/25/2024)  Final Diagnosis performed by Rexene Daily, MD.   Electronically signed 06/28/2024 Technical component performed at [facility]. [facility], [address] [place], [place] [address].  Professional component performed at [facility]. [address] [place], [place] [address]  Immunohistochemistry Technical component (if applicable) was performed at [employer]. [address], [place], [place] [address].  IMMUNOHISTOCHEMISTRY DISCLAIMER (if applicable): Some of these immunohistochemical stains may have been developed and the performance characteristics determine by [facility]. Some may not have been cleared or approved by the U.S. Food and Drug Administration. The FDA has determined that such clearance or approval is not necessary. This test is used for clinical purposes. It should not be rega                         rded as investigational or for research. This laboratory is certified under the Clinical Laboratory Improvement Amendments of 1988 (CLIA-88) as qualified to perform high complexity clinical laboratory testing.  The controls stained appropriately.     Blood Alcohol level:  Lab Results  Component Value Date   ETH 52 (H) 11/22/2024   ETH <15 06/20/2024    Metabolic Disorder Labs: Lab Results  Component Value Date   HGBA1C 6.1 (H) 11/22/2024   MPG 128.37 11/22/2024   MPG 119.76 04/26/2024   Lab Results  Component Value Date   PROLACTIN 32.0 (H) 11/05/2015   Lab Results  Component Value  Date   CHOL 201 (H) 11/22/2024   TRIG 132 11/22/2024   HDL 80 11/22/2024   CHOLHDL 2.5 11/22/2024   VLDL 26 11/22/2024   LDLCALC 95 11/22/2024   LDLCALC 52 06/29/2020    Therapeutic Lab Levels: No results found for: LITHIUM No results found for: VALPROATE No results found for: CBMZ  Physical Findings   AIMS    Flowsheet Row [facility]) [facility]  AIMS Total Score 0 0   AUDIT    Flowsheet Row Admission (Discharged) from  01/04/2024 in [facility] Office Visit from 06/29/2020 in [facility] Ad[facility] Ad[facility] (Dis[facility]) [facility] ([facility]  Alcohol Use Disorder Identification Test Final Score (AUDIT) 6 10 27  34 38   GAD-7    Flowsheet Row Office Visit from 06/29/2020 in [facility]  Total GAD-7 Score 15   PHQ2-9    Flowsheet Row ED from 11/22/2024 in [facility] Office Visit from 06/29/2020 in [facility]  PHQ-2 Total Score 5 4  PHQ-9 Total Score 17 19   Flowsheet Row ED from 11/22/2024 in [facility] Most recent reading at 11/23/2024 12:35 AM ED from 11/22/2024 in [facility] Most recent reading at 11/22/2024  3:50 PM ED from 11/21/2024 in [facility] Most recent reading at 11/21/2024  3:36 PM  C-SSRS RISK CATEGORY No Risk No Risk No Risk     Musculoskeletal  Strength & Muscle Tone: within normal limits Gait & Station: normal Patient leans: N/A  Psychiatric Specialty Exam  Presentation  General Appearance:  Appropriate for  Environment; Casual  Eye Contact: Good  Speech: Clear and Coherent; Normal Rate  Speech Volume: Normal  Handedness: Right   Mood and Affect  Mood: Depressed  Affect: Appropriate; Congruent   Thought Process  Thought Processes: Coherent; Goal Directed; Linear  Descriptions of Associations:Intact  Orientation:Full (Time, Place and Person)  Thought Content:Logical; WDL  Diagnosis of Schizophrenia or Schizoaffective disorder in past: Yes  Duration of Psychotic Symptoms: Greater than six months   Hallucinations:Hallucinations: Auditory Description of Auditory Hallucinations: I hear noises, like voices that scream, all the time  Ideas of Reference:None  Suicidal Thoughts:Suicidal Thoughts: No  Homicidal Thoughts:Homicidal Thoughts: No   Sensorium  Memory: Immediate Fair; Recent Fair  Judgment: Fair  Insight: Fair   Art Therapist  Concentration: Fair  Attention Span: Fair  Recall: [employer] of Knowledge: Fair  Language: Fair   Psychomotor Activity  Psychomotor Activity: Psychomotor Activity: Normal   Assets  Assets: Communication Skills; Desire for Improvement; Housing; Health And Safety Inspector; Resilience; Social Support   Sleep  Sleep: Sleep: Fair  Estimated Sleeping Duration (Last 24 Hours): 8.25-9.50 hours  No data recorded  Physical Exam  Physical Exam Vitals and nursing note reviewed.  Constitutional:      Appearance: Normal appearance. He is well-developed.  HENT:     Head: Normocephalic and atraumatic.     Nose: Nose normal.  Cardiovascular:     Rate and Rhythm: Normal rate.  Pulmonary:     Effort: Pulmonary effort is normal.  Musculoskeletal:        General: Normal range of motion.     Cervical back: Normal range of motion.  Skin:    General: Skin is warm and dry.  Neurological:     Mental Status: He is alert and oriented to person, place, and time.  Psychiatric:        Attention and  Perception: Attention normal.        Mood and Affect: Mood is depressed. Affect is flat.        Speech: Speech normal.        Behavior: Behavior normal. Behavior is cooperative.        Thought Content: Thought content normal.        Cognition  and Memory: Cognition and memory normal.        Judgment: Judgment normal.    Review of Systems  Constitutional: Negative.   HENT: Negative.    Eyes: Negative.   Respiratory: Negative.    Cardiovascular: Negative.   Gastrointestinal: Negative.   Genitourinary: Negative.   Musculoskeletal: Negative.   Skin: Negative.   Neurological: Negative.   Psychiatric/Behavioral:  Positive for depression, hallucinations and substance abuse.    Blood pressure 128/84, pulse 78, temperature 98 F (36.7 C), temperature source Oral, resp. rate 16, SpO2 100%. There is no height or weight on file to calculate BMI.  Treatment Plan Summary: Continued admission at [facility] behavioral health facility based crisis.  Patient remains voluntary. Daily contact with patient to assess and evaluate symptoms and progress in treatment  Labs Reviewed: No new lab orders placed. EKG from 02/03 (baseline) with NSR QTc measures 447 ms.  PRN medications: -Acetaminophen  650 mg every 6 hours, as needed/mild pain -Maalox 30 mL oral every 4 hours, as needed/digestion -Hydroxyzine  25 mg 3 times daily as needed/anxiety or CIWA < or = 10. -Magnesium  hydroxide 30 mL daily as needed/mild constipation -Trazodone  50 mg nightly, as needed/sleep  CIWA Ativan  protocol: -Loperamide  2 to 4 mg oral as needed/diarrhea or loose stools -Lorazepam  1 mg every 6 hours as needed CIWA greater than 10 -Ondansetron  disintegrating tablet 4 mg every 6 as needed/nausea or vomiting -Thiamine  tablet 100 mg daily   Prior to admission medications continued: -Albuterol  Ventolin  HFA 108 mcg/ACT inhaler 2 puff every 6 hours as needed/wheezing or shortness of breath -Atorvastatin  80 mg daily at  bedtime -Calcium -vitamin D  500-5 mg-microgram 1 tablet daily with lunch -Docusate sodium  100 mg daily as needed/mild constipation, moderate constipation. -Duloxetine  DR 60 mg twice daily -Famotidine  20 mg twice daily -Folic acid  1 mg oral daily -Gabapentin  300 mg 3 times daily as needed/burning/shooting pain. -Latanoprost  0.005% ophthalmic solution 1 drop both eyes daily -Mesalamine  EC 4.8 g daily with breakfast -Metformin  XR 500 mg daily with breakfast -Metoprolol  XL 100 mg daily -Multivitamin with minerals 1 tablet daily -Nicotine  NicoDerm transdermal patch 21 mg daily -Olanzapine  15 mg nightly -Pantoprazole  40 mg twice daily -Sacubitril -valsartan  49-51 mg 1 tablet BID -Topiramate  50 mg twice daily  -Agitation protocol    Discharge Planning: Patient would like residential substance use tx. [facility]. Disposition/case management to assist with discharge planning and identification of follow-up needs including residential or outpatient substance use treatment options as needed, prior to discharge Discharge Concerns: Need to confirm safety plan; Medication compliance and effectiveness Discharge Goals: Return home with outpatient referrals for mental health follow-up including medication management/psychotherapy and substance use treatment follow-up resources as appropriate   Note: This document was prepared using Dragon voice recognition software and may include unintentional dictation errors.  Ellouise LITTIE Dawn, FNP 11/23/2024 5:46 PM

## 2024-11-23 NOTE — Group Note (Signed)
 Group Topic: Healthy Self Image and Positive Change  Group Date: 11/23/2024 Start Time: 1100 End Time: 1200 Facilitators: Wilkin Lippy, LCSW  Department: [facility]  Number of Participants: 6  Group Focus: abuse issues, chemical dependency education, feeling awareness/expression, forgiveness, personal responsibility, relapse prevention, self-esteem, and substance abuse education Treatment Modality:  Cognitive Behavioral Therapy Interventions utilized were group exercise, problem solving, and support Purpose: enhance coping skills, express feelings, express irrational fears, increase insight, regain self-worth, and relapse prevention strategies  Writer utilized a CBT activity called Gratitude Log.  The activity explored with each client things they are thankful for, best memories,  challenges/learned from those challenges, and their goals related to substance use.    Name: TREYVONE CHELF Date of Birth: [birth year]  MR: [id number]    Level of Participation: withdrawn Quality of Participation: uncooperative and withdrawn Interactions with others: Client participated the first 5 minutes of group and did not participate after.  Client was also nodding off.  Mood/Affect: agitated, bored, incongruent, and restless Triggers (if applicable): unknown. Client would not participate when called on  Cognition: unknown Progress: None Response: Client was disengaged and would not share Plan: follow-up as needed  Patients Problems:  Patient Active Problem List   Diagnosis Date Noted   Hematochezia 06/24/2024   Hemorrhoids 06/24/2024   Polyp of ascending colon 06/24/2024   Lower GI bleed 06/20/2024   Anemia 05/12/2024   Acute lower GI bleeding 05/11/2024   Alcohol use disorder 05/11/2024   GERD (gastroesophageal reflux disease) 05/11/2024   Pain due to total left knee replacement 05/11/2024   Status post total left knee replacement 05/05/2024   Primary  osteoarthritis of left knee 05/04/2024   Syncope 03/10/2024   Diarrhea 03/10/2024   Hypotension 01/17/2024   Chronic health problem 01/15/2024   AKI (acute kidney injury) 01/15/2024   History of seizure due to alcohol withdrawal 01/15/2024   Abdominal pain 01/15/2024   Major depressive disorder, 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   Major depressive disorder, recurrent, severe without psychotic behavior (HCC) 01/02/2024   Low back pain 12/31/2023   Lactic acid acidosis 12/31/2023   Low glucose level 12/31/2023   Suicidal behavior 12/31/2023   Chronic colitis 04/25/2022   Alcoholic cirrhosis of liver without ascites (HCC) 04/25/2022   Portal hypertensive gastropathy (HCC) 04/25/2022   History of anemia 04/25/2022   Hyponatremia 01/23/2022   High anion gap metabolic acidosis 01/23/2022   Hyperkalemia 01/23/2022   Fatty liver 01/23/2022   Insomnia 10/04/2020   AMS (altered mental status) 07/23/2020   Sleep apnea    Hypertension    Acute metabolic encephalopathy 01/24/2020   Normocytic anemia 08/15/2019   Hypothyroidism 08/15/2019   HLD (hyperlipidemia) 08/15/2019   Anxiety and depression 08/15/2019   Chronic systolic CHF (congestive heart failure) (HCC) 02/08/2019   Tobacco dependence 02/08/2019   Alcohol withdrawal (HCC) 01/2019   HTN (hypertension) 12/24/2018   Alcohol abuse 01/06/2016   MDD (major depressive disorder), recurrent, severe, 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/03/2015   Alcohol use disorder, severe, dependence (HCC) 11/03/2015   History of GI bleed 10/31/2015   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

## 2024-11-23 NOTE — ED Notes (Signed)
 Pt received at the beginning of the shift awake in the day room. Without complaints of withdrawal symptoms. Affect is flat.  Safety maintained.

## 2024-11-23 NOTE — ED Notes (Signed)
 Pt sleeping long periods thru night in no apparent distress.  Safety rounds maintained.

## 2024-11-23 NOTE — Care Management (Signed)
 [facility] Care Management...  Client gave verbal consent to contact his wife [name].  Writer called Edwina at [phone].   [name] reports the client is scheduled to be baptized on Sunday.  Edwina reports the client relapsed recently on alcohol.  Edwina reports the client receives his primary care through the [facility].  Client is 100% [employer].  [name] would like for the client to consider [facility] upstairs as another resource.  [name] reports she does not have the contact information for his child psychotherapist through the [place].  Writer spoke with the client.  Client reports he's interested in [facility] upstairs as well.  Writer will send a message to [facility] to request an appointment.  Writer will be provided with his phone later on today to give the Writer his [facility] case manager contact information.

## 2024-11-23 NOTE — ED Notes (Signed)
 RN spoke with patient A&Ox4. Denies intent to harm self/others when asked. Denies A/VH or any physical complaints when asked. No acute distress noted. Active listening, support and encouragement provided. Routine safety checks conducted according to facility protocol. Encouraged patient to notify staff if thoughts of harm toward self or others arise. Patient verbalize understanding and agreement. Patient request to review seroquel  discontinuation sent to provider pending response.

## 2024-11-24 NOTE — ED Provider Notes (Cosign Needed)
 Behavioral Health Progress Note  Date and Time: 11/24/2024 9:43 PM Name: Jacob Adams MRN:  [id number]  Subjective: Norleen states I relapsed about a month ago but I want to stop drinking and get my life back on track.  Kyndall Chaplin is a 59 year old male who presented voluntarily to [facility] behavioral health on 11/22/2024 and admitted to [facility].  Patient history includes MDD, schizophrenia, alcohol use disorder.  Patient presented with request for detox from alcohol.  Patient endorsed drinking up to 15 beers and 1/5 of liquor daily for 1 week.  Patient completed [facility] use [facility] program in October 2025.  40-month sobriety prior to relapse 1 week ago.  He also endorsed auditory and visual hallucinations of demons upon initial presentation.   Upon assessment, pt is observed in the dayroom watching TV and eating, in no acute distress. He is alert and oriented, calm and cooperative with a somewhat blunted affect. He is a veteran - [facility]. Pt desires to commit to a similar program upon discharge and will reach out to [facility] hospital in [place]. He reports chronic auditory hallucinations people yelling - which have reduced significantly since admission and are almost at his baseline, as pt reports they never really go away. States the voices have always been there but it's less. Denies having alcohol withdrawal symptoms. Stable appetite. Sleep has been fair - left knee pain (TKR 04/2024) interfering with sleep. Denies SI or HI. Reports migraine today - has used ubrelvy  for this in the past. Looking forward to discharge to an intensive outpatient program though the [place]. Pt has been compliant with medications and denies any concerns at this time. Pt feels that he will be ready for discharge by the weekend.   Diagnosis:  Final diagnoses:  Alcohol use disorder, severe, dependence (HCC)  MDD (major depressive  disorder), recurrent, severe, [name]    Total Time spent with patient: 20 minutes  Past Psychiatric History: Alcohol use disorder, schizophrenia, MDD. Past Medical History:  Past Medical History:  Diagnosis Date   Acid reflux    Alcohol withdrawal (HCC) 01/2019   Anxiety    Arthritis    toes (07/26/2014)   CHF (congestive heart failure) (HCC)    Depression    Diabetes mellitus without complication (HCC)    Headache(784.0)    weekly (07/26/2014)   History of blood transfusion ~ 2000   related to nose bleeding   History of stomach ulcers    Hypertension    Lower GI bleeding admitted 07/26/2014   Mental disorder    Migraine    @ least monthly (07/26/2014)   Pancreatitis    Rectal bleeding 07/26/2014   Sleep apnea    haven't been RX'd mask yet (07/26/2014)    Patient Active Problem List   Diagnosis Date Noted   Hematochezia 06/24/2024   Hemorrhoids 06/24/2024   Polyp of ascending colon 06/24/2024   Lower GI bleed 06/20/2024   Anemia 05/12/2024   Acute lower GI bleeding 05/11/2024   Alcohol use disorder 05/11/2024   GERD (gastroesophageal reflux disease) 05/11/2024   Pain due to total left knee replacement 05/11/2024   Status post total left knee replacement 05/05/2024   Primary osteoarthritis of left knee 05/04/2024   Syncope 03/10/2024   Diarrhea 03/10/2024   Hypotension 01/17/2024   Chronic health problem 01/15/2024   AKI (acute kidney injury) 01/15/2024   History of seizure due to alcohol  withdrawal 01/15/2024   Abdominal pain 01/15/2024   Major depressive disorder, 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   Major depressive disorder, recurrent, severe without psychotic behavior (HCC) 01/02/2024   Low back pain 12/31/2023   Lactic acid acidosis 12/31/2023   Low glucose level 12/31/2023   Suicidal behavior 12/31/2023   Chronic colitis 04/25/2022   Alcoholic cirrhosis of liver without ascites (HCC) 04/25/2022   Portal  hypertensive gastropathy (HCC) 04/25/2022   History of anemia 04/25/2022   Hyponatremia 01/23/2022   High anion gap metabolic acidosis 01/23/2022   Hyperkalemia 01/23/2022   Fatty liver 01/23/2022   Insomnia 10/04/2020   AMS (altered mental status) 07/23/2020   Sleep apnea    Hypertension    Acute metabolic encephalopathy 01/24/2020   Normocytic anemia 08/15/2019   Hypothyroidism 08/15/2019   HLD (hyperlipidemia) 08/15/2019   Anxiety and depression 08/15/2019   Chronic systolic CHF (congestive heart failure) (HCC) 02/08/2019   Tobacco dependence 02/08/2019   Alcohol withdrawal (HCC) 01/2019   HTN (hypertension) 12/24/2018   Alcohol abuse 01/06/2016   MDD (major depressive disorder), recurrent, severe, 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/03/2015   Alcohol use disorder, severe, dependence (HCC) 11/03/2015   History of GI bleed 10/31/2015   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 He receives disability income. [employer] case manager has located an employment opportunity for patient, scheduled to start soon. Denies access to weapons. Patient identifies his wife as an emotional support, she does not reside in the home.   Additional Social History:                         Sleep: Fair  Appetite:  Good  Current Medications:  Current Facility-Administered Medications  Medication Dose Route Frequency Provider Last Rate  Last Admin   acetaminophen  (TYLENOL ) tablet 650 mg  650 mg Oral Q6H PRN Trudy Carwin, NP       albuterol  (VENTOLIN  HFA) 108 (90 Base) MCG/ACT inhaler 2 puff  2 puff Inhalation Q6H PRN Trudy Carwin, NP       alum & mag hydroxide-simeth (MAALOX/MYLANTA) 200-200-20 MG/5ML suspension 30 mL  30 mL Oral Q4H PRN Trudy Carwin, NP       atorvastatin  (LIPITOR) tablet 80 mg  80 mg Oral QHS Trudy Carwin, NP   80 mg at 11/24/24 2117   calcium -vitamin D  (OSCAL WITH D) 500-5 MG-MCG per tablet 1 tablet  1 tablet Oral Q lunch Tex Drilling, NP   1 tablet at 11/24/24 1131   haloperidol  (HALDOL ) tablet 5 mg  5 mg Oral TID PRN Trudy Carwin, NP       And   diphenhydrAMINE  (BENADRYL ) capsule 50 mg  50 mg Oral TID PRN Trudy Carwin, NP       haloperidol  lactate (HALDOL ) injection 5 mg  5 mg  Intramuscular TID PRN Trudy Carwin, NP       And   diphenhydrAMINE  (BENADRYL ) injection 50 mg  50 mg Intramuscular TID PRN Trudy Carwin, NP       And   LORazepam  (ATIVAN ) injection 2 mg  2 mg Intramuscular TID PRN Trudy Carwin, NP       haloperidol  lactate (HALDOL ) injection 10 mg  10 mg Intramuscular TID PRN Trudy Carwin, NP       And   diphenhydrAMINE  (BENADRYL ) injection 50 mg  50 mg Intramuscular TID PRN Trudy Carwin, NP       And   LORazepam  (ATIVAN ) injection 2 mg  2 mg Intramuscular TID PRN Trudy Carwin, NP       docusate sodium  (COLACE) capsule 100 mg  100 mg Oral Daily PRN Trudy Carwin, NP       DULoxetine  (CYMBALTA ) DR capsule 60 mg  60 mg Oral BID Nkwenti, Doris, NP   60 mg at 11/24/24 2116   famotidine  (PEPCID ) tablet 20 mg  20 mg Oral BID Nkwenti, Doris, NP   20 mg at 11/24/24 2117   folic acid  (FOLVITE ) tablet 1 mg  1 mg Oral Daily Trudy Carwin, NP   1 mg at 11/24/24 0930   gabapentin  (NEURONTIN ) capsule 300 mg  300 mg Oral TID PRN Trudy Carwin, NP       hydrOXYzine  (ATARAX ) tablet 25 mg  25 mg Oral Q6H PRN Trudy Carwin, NP   25 mg at 11/22/24 2240   latanoprost  (XALATAN ) 0.005 % ophthalmic  solution 1 drop  1 drop Both Eyes Daily Trudy Carwin, NP   1 drop at 11/24/24 0933   levothyroxine  (SYNTHROID ) tablet 88 mcg  88 mcg Oral Q0600 Trudy Carwin, NP   88 mcg at 11/24/24 9171   loperamide  (IMODIUM ) capsule 2-4 mg  2-4 mg Oral PRN Trudy Carwin, NP       LORazepam  (ATIVAN ) tablet 1 mg  1 mg Oral Q6H PRN Trudy Carwin, NP       magnesium  hydroxide (MILK OF MAGNESIA) suspension 30 mL  30 mL Oral Daily PRN Trudy Carwin, NP       mesalamine  (LIALDA ) EC tablet 4.8 g  4.8 g Oral Q breakfast Nkwenti, Doris, NP       metFORMIN  (GLUCOPHAGE -XR) 24 hr tablet 500 mg  500 mg Oral Q breakfast Tex Drilling, NP   500 mg at 11/24/24 9170   metoprolol  succinate (TOPROL -XL) 24 hr tablet 100 mg  100 mg Oral Daily Nkwenti, Doris, NP   100 mg at 11/24/24 0825   multivitamin with minerals tablet 1 tablet  1 tablet Oral Daily Trudy Carwin, NP   1 tablet at 11/24/24 0932   nicotine  (NICODERM CQ  - dosed in mg/24 hours) patch 21 mg  21 mg Transdermal Daily Nkwenti, Doris, NP   21 mg at 11/24/24 9066   OLANZapine  (ZYPREXA ) tablet 15 mg  15 mg Oral QHS Tex Drilling, NP   15 mg at 11/24/24 2118   ondansetron  (ZOFRAN -ODT) disintegrating tablet 4 mg  4 mg Oral Q6H PRN Trudy Carwin, NP       pantoprazole  (PROTONIX ) EC tablet 40 mg  40 mg Oral BID Nkwenti, Doris, NP   40 mg at 11/24/24 2118   sacubitril -valsartan  (ENTRESTO ) 49-51 mg per tablet  1 tablet Oral BID Tex Drilling, NP   1 tablet at 11/24/24 2116   thiamine  (VITAMIN B1) tablet 100 mg  100 mg Oral Daily Trudy Carwin, NP   100 mg at 11/24/24 [phone]  topiramate  (TOPAMAX ) tablet 50 mg  50 mg Oral BID Tex Drilling, NP   50 mg at 11/24/24 2125   traZODone  (DESYREL ) tablet 50 mg  50 mg Oral QHS PRN Trudy Carwin, NP   50 mg at 11/24/24 2119   Current Outpatient Medications  Medication Sig Dispense Refill   albuterol  (VENTOLIN  HFA) 108 (90 Base) MCG/ACT inhaler Inhale 2 puffs into the lungs every 6 (six) hours as needed for wheezing or shortness of  breath.     alprostadil  (EDEX ) 40 MCG injection 40 mcg by Intracavitary route See admin instructions. Inject every 96 hours (every 4 days) as needed into penis. Seek medical attention for erection lasting more than 4 hours     ammonium lactate  (LAC-HYDRIN ) 12 % lotion Apply 1 Application topically daily.     atorvastatin  (LIPITOR) 80 MG tablet Take 1 tablet (80 mg total) by mouth at bedtime. (Patient taking differently: Take 80 mg by mouth in the morning.) 30 tablet 0   Calcium  Carb-Cholecalciferol  (CALTRATE 600+D3 PO) Take 1 tablet by mouth in the morning.     docusate sodium  (COLACE) 100 MG capsule Take 1 capsule (100 mg total) by mouth daily as needed. (Patient taking differently: Take 100 mg by mouth daily.) 30 capsule 2   DULoxetine  (CYMBALTA ) 60 MG capsule Take 60 mg by mouth 2 (two) times daily.     famotidine  (PEPCID ) 20 MG tablet Take 20 mg by mouth 2 (two) times daily.     hydrocortisone  (ANUSOL -HC) 2.5 % rectal cream Place 1 Application rectally daily as needed for hemorrhoids or anal itching.     latanoprost  (XALATAN ) 0.005 % ophthalmic solution Place 1 drop into both eyes at bedtime.     levothyroxine  (SYNTHROID ) 88 MCG tablet Take 88 mcg by mouth daily before breakfast.     mesalamine  (LIALDA ) 1.2 g EC tablet Take 4 tablets (4.8 g total) by mouth daily with breakfast. 120 tablet 1   metFORMIN  (GLUCOPHAGE -XR) 500 MG 24 hr tablet Take 500 mg by mouth every morning.     metoprolol  succinate (TOPROL -XL) 100 MG 24 hr tablet Take 100 mg by mouth daily. Take with or immediately following a meal. Hold for SBP less than 100 and/or heart rate less than 60     Multiple Vitamin (MULTIVITAMIN WITH MINERALS) TABS tablet Take 1 tablet by mouth daily. 30 tablet 0   naproxen  (NAPROSYN ) 375 MG tablet Take 375 mg by mouth 2 (two) times daily as needed (For pain).     nicotine  (NICODERM CQ  - DOSED IN MG/24 HOURS) 21 mg/24hr patch Place 21 mg onto the skin daily.     nicotine  polacrilex (COMMIT) 2 MG  lozenge Take 2 mg by mouth every 2 (two) hours as needed for smoking cessation.     OLANZapine  (ZYPREXA ) 15 MG tablet Take 15 mg by mouth at bedtime.     oxyCODONE  (OXY IR/ROXICODONE ) 5 MG immediate release tablet Take 10 mg by mouth 3 (three) times daily as needed (For pain).     pantoprazole  (PROTONIX ) 40 MG tablet Take 1 tablet (40 mg total) by mouth 2 (two) times daily. 60 tablet 0   Phenylephrine  HCl 0.25 % SUPP Place 1 suppository rectally daily as needed (For hemorrhoids).     sacubitril -valsartan  (ENTRESTO ) 49-51 MG Take 1 tablet by mouth 2 (two) times daily. 60 tablet 0   topiramate  (TOPAMAX ) 50 MG tablet Take 50 mg by mouth 2 (two) times daily.     Ubrogepant  (UBRELVY ) 50 MG TABS Take 50 mg  by mouth daily as needed (for migraines).      Labs  Lab Results:  Admission on 11/22/2024  Component Date Value Ref Range Status   Glucose-Capillary 11/22/2024 111 (H)  70 - 99 mg/dL Final   Glucose reference range applies only to samples taken after fasting for at least 8 hours.  Admission on 11/22/2024, Discharged on 11/22/2024  Component Date Value Ref Range Status   Sodium 11/22/2024 136  135 - 145 mmol/L Final   Potassium 11/22/2024 4.6  3.5 - 5.1 mmol/L Final   Chloride 11/22/2024 99  98 - 111 mmol/L Final   CO2 11/22/2024 19 (L)  22 - 32 mmol/L Final   Glucose, Bld 11/22/2024 84  70 - 99 mg/dL Final   Glucose reference range applies only to samples taken after fasting for at least 8 hours.   BUN 11/22/2024 12  6 - 20 mg/dL Final   Creatinine, Ser 11/22/2024 1.03  0.61 - 1.24 mg/dL Final   Calcium  11/22/2024 9.3  8.9 - 10.3 mg/dL Final   Total Protein 97/96/7973 8.3 (H)  6.5 - 8.1 g/dL Final   Albumin 97/96/7973 4.8  3.5 - 5.0 g/dL Final   AST 97/96/7973 40  15 - 41 U/L Final   HEMOLYSIS AT THIS LEVEL MAY AFFECT RESULT   ALT 11/22/2024 32  0 - 44 U/L Final   Alkaline Phosphatase 11/22/2024 84  38 - 126 U/L Final   Total Bilirubin 11/22/2024 0.5  0.0 - 1.2 mg/dL Final   GFR,  Estimated 11/22/2024 >60  >60 mL/min Final   Comment: (NOTE) Calculated using the CKD-EPI Creatinine Equation (2021)    Anion gap 11/22/2024 19 (H)  5 - 15 Final   Performed at [facility], [address] [place], [place] [address]   Hgb A1c MFr Bld 11/22/2024 6.1 (H)  4.8 - 5.6 % Final   Comment: (NOTE) Diagnosis of Diabetes The following HbA1c ranges recommended by the American Diabetes Association (ADA) may be used as an aid in the diagnosis of diabetes mellitus.  Hemoglobin             Suggested A1C NGSP%              Diagnosis  <5.7                   Non Diabetic  5.7-6.4                Pre-Diabetic  >6.4                   Diabetic  <7.0                   Glycemic control for                       adults with diabetes.     Mean Plasma Glucose 11/22/2024 128.37  mg/dL Final   Performed at [facility], [address] [place], [place] [address]   Magnesium  11/22/2024 2.3  1.7 - 2.4 mg/dL Final   Performed at [facility], [address], [place], [place] [address]   Alcohol, Ethyl (B) 11/22/2024 52 (H)  <15 mg/dL Final   Comment: (NOTE) For medical purposes only. Performed at [facility], [address], [place], [place] [address]    Cholesterol 11/22/2024 201 (H)  0 - 200 mg/dL Final   Comment:  ATP III CLASSIFICATION:  <200     mg/dL   Desirable  799-760  mg/dL   Borderline High  >=759    mg/dL   High           Triglycerides 11/22/2024 132  <150 mg/dL Final   HDL 97/96/7973 80  >40 mg/dL Final   Total CHOL/HDL Ratio 11/22/2024 2.5  RATIO Final   VLDL 11/22/2024 26  0 - 40 mg/dL Final   LDL Cholesterol 11/22/2024 95  0 - 99 mg/dL Final   Comment:        Total Cholesterol/HDL:CHD Risk Coronary Heart Disease Risk Table                     Men   Women  1/2 Average Risk   3.4   3.3  Average Risk       5.0   4.4  2 X Average Risk   9.6   7.1  3 X Average Risk  23.4   11.0        Use the calculated Patient Ratio above and the  CHD Risk Table to determine the patient's CHD Risk.        ATP III CLASSIFICATION (LDL):  <100     mg/dL   Optimal  899-870  mg/dL   Near or Above                    Optimal  130-159  mg/dL   Borderline  839-810  mg/dL   High  >809     mg/dL   Very High Performed at [facility], [address] [place], [place] [address]    TSH 11/22/2024 0.753  0.350 - 4.500 uIU/mL Final   Performed at [facility], [address] [place], [place] [address]   Color, Urine 11/22/2024 YELLOW  YELLOW Final   APPearance 11/22/2024 CLEAR  CLEAR Final   Specific Gravity, Urine 11/22/2024 1.009  1.005 - 1.030 Final   pH 11/22/2024 5.0  5.0 - 8.0 Final   Glucose, UA 11/22/2024 >=500 (A)  NEGATIVE mg/dL Final   Hgb urine dipstick 11/22/2024 SMALL (A)  NEGATIVE Final   Bilirubin Urine 11/22/2024 NEGATIVE  NEGATIVE Final   Ketones, ur 11/22/2024 5 (A)  NEGATIVE mg/dL Final   Protein, ur 97/96/7973 100 (A)  NEGATIVE mg/dL Final   Nitrite 97/96/7973 NEGATIVE  NEGATIVE Final   Leukocytes,Ua 11/22/2024 NEGATIVE  NEGATIVE Final   RBC / HPF 11/22/2024 0-5  0 - 5 RBC/hpf Final   WBC, UA 11/22/2024 0-5  0 - 5 WBC/hpf Final   Bacteria, UA 11/22/2024 NONE SEEN  NONE SEEN Final   Squamous Epithelial / HPF 11/22/2024 0-5  0 - 5 /HPF Final   Mucus 11/22/2024 PRESENT   Final   Hyaline Casts, UA 11/22/2024 PRESENT   Final   Performed at [facility], [address], [place], [place] [address]   POC Amphetamine UR 11/22/2024 None Detected  NONE DETECTED (Cut Off Level 1000 ng/mL) Final   POC Secobarbital (BAR) 11/22/2024 None Detected  NONE DETECTED (Cut Off Level 300 ng/mL) Final   POC Buprenorphine (BUP) 11/22/2024 None Detected  NONE DETECTED (Cut Off Level 10 ng/mL) Final   POC Oxazepam (BZO) 11/22/2024 None Detected  NONE DETECTED (Cut Off Level 300 ng/mL) Final   POC Cocaine UR 11/22/2024 None Detected  NONE DETECTED (Cut Off Level 300 ng/mL) Final   POC Methamphetamine UR 11/22/2024 None  Detected  NONE DETECTED (Cut Off Level 1000 ng/mL) Final   POC Morphine  11/22/2024 None Detected  NONE DETECTED (Cut Off Level 300 ng/mL) Final   POC Methadone UR 11/22/2024 None Detected  NONE DETECTED (Cut Off Level 300 ng/mL) Final   POC Oxycodone  UR 11/22/2024 Positive (A)  NONE DETECTED (Cut Off Level 100 ng/mL) Final   POC Marijuana UR 11/22/2024 None Detected  NONE DETECTED (Cut Off Level 50 ng/mL) Final  Admission on 06/20/2024, Discharged on 06/24/2024  Component Date Value Ref Range Status   WBC 06/20/2024 3.7 (L)  4.0 - 10.5 K/uL Final   RBC 06/20/2024 3.66 (L)  4.22 - 5.81 MIL/uL Final   Hemoglobin 06/20/2024 12.2 (L)  13.0 - 17.0 g/dL Final   HCT 90/98/7974 35.6 (L)  39.0 - 52.0 % Final   MCV 06/20/2024 97.3  80.0 - 100.0 fL Final   MCH 06/20/2024 33.3  26.0 - 34.0 pg Final   MCHC 06/20/2024 34.3  30.0 - 36.0 g/dL Final   RDW 90/98/7974 13.8  11.5 - 15.5 % Final   Platelets 06/20/2024 343  150 - 400 K/uL Final   nRBC 06/20/2024 0.0  0.0 - 0.2 % Final   Neutrophils Relative % 06/20/2024 62  % Final   Neutro Abs 06/20/2024 2.4  1.7 - 7.7 K/uL Final   Lymphocytes Relative 06/20/2024 23  % Final   Lymphs Abs 06/20/2024 0.8  0.7 - 4.0 K/uL Final   Monocytes Relative 06/20/2024 12  % Final   Monocytes Absolute 06/20/2024 0.4  0.1 - 1.0 K/uL Final   Eosinophils Relative 06/20/2024 0  % Final   Eosinophils Absolute 06/20/2024 0.0  0.0 - 0.5 K/uL Final   Basophils Relative 06/20/2024 2  % Final   Basophils Absolute 06/20/2024 0.1  0.0 - 0.1 K/uL Final   Immature Granulocytes 06/20/2024 1  % Final   Abs Immature Granulocytes 06/20/2024 0.02  0.00 - 0.07 K/uL Final   Performed at [facility], [address] [place], [place] [address]   Sodium 06/20/2024 134 (L)  135 - 145 mmol/L Final   Potassium 06/20/2024 4.1  3.5 - 5.1 mmol/L Final   Chloride 06/20/2024 98  98 - 111 mmol/L Final   CO2 06/20/2024 20 (L)  22 - 32 mmol/L Final   Glucose, Bld 06/20/2024 92  70 - 99 mg/dL  Final   Glucose reference range applies only to samples taken after fasting for at least 8 hours.   BUN 06/20/2024 6  6 - 20 mg/dL Final   Creatinine, Ser 06/20/2024 0.84  0.61 - 1.24 mg/dL Final   Calcium  06/20/2024 8.9  8.9 - 10.3 mg/dL Final   Total Protein 90/98/7974 8.2 (H)  6.5 - 8.1 g/dL Final   Albumin 90/98/7974 4.1  3.5 - 5.0 g/dL Final   AST 90/98/7974 32  15 - 41 U/L Final   ALT 06/20/2024 15  0 - 44 U/L Final   Alkaline Phosphatase 06/20/2024 88  38 - 126 U/L Final   Total Bilirubin 06/20/2024 1.0  0.0 - 1.2 mg/dL Final   GFR, Estimated 06/20/2024 >60  >60 mL/min Final   Comment: (NOTE) Calculated using the CKD-EPI Creatinine Equation (2021)    Anion gap 06/20/2024 16 (H)  5 - 15 Final   Performed at [facility], [address], [place], [place] [address]   ABO/RH(D) 06/20/2024 O POS   Final   Antibody Screen 06/20/2024 NEG   Final   Sample Expiration 06/20/2024  Final                   Value:06/23/2024,2359 Performed at [facility], [address] [place], [place] [address]    Prothrombin Time 06/20/2024 13.5  11.4 - 15.2 seconds Final   INR 06/20/2024 1.0  0.8 - 1.2 Final   Comment: (NOTE) INR goal varies based on device and disease states. Performed at [facility], [address] [place], [place] [address]    Color, Urine 06/20/2024 STRAW (A)  YELLOW Final   APPearance 06/20/2024 CLEAR  CLEAR Final   Specific Gravity, Urine 06/20/2024 1.005  1.005 - 1.030 Final   pH 06/20/2024 7.0  5.0 - 8.0 Final   Glucose, UA 06/20/2024 NEGATIVE  NEGATIVE mg/dL Final   Hgb urine dipstick 06/20/2024 NEGATIVE  NEGATIVE Final   Bilirubin Urine 06/20/2024 NEGATIVE  NEGATIVE Final   Ketones, ur 06/20/2024 5 (A)  NEGATIVE mg/dL Final   Protein, ur 90/98/7974 100 (A)  NEGATIVE mg/dL Final   Nitrite 90/98/7974 NEGATIVE  NEGATIVE Final   Leukocytes,Ua 06/20/2024 NEGATIVE  NEGATIVE Final   RBC / HPF 06/20/2024 0-5  0 - 5 RBC/hpf Final   WBC, UA 06/20/2024 0-5   0 - 5 WBC/hpf Final   Bacteria, UA 06/20/2024 NONE SEEN  NONE SEEN Final   Squamous Epithelial / HPF 06/20/2024 0-5  0 - 5 /HPF Final   Mucus 06/20/2024 PRESENT   Final   Performed at [facility], [address], [place], [place] [address]   Alcohol, Ethyl (B) 06/20/2024 <15  <15 mg/dL Final   Comment: (NOTE) For medical purposes only. Performed at [facility], [address], [place], [place] [address]    Ammonia 06/20/2024 <13  9 - 35 umol/L Final   Performed at [facility], [address] [place], [place] [address]   Opiates 06/20/2024 NONE DETECTED  NONE DETECTED Final   Cocaine 06/20/2024 NONE DETECTED  NONE DETECTED Final   Benzodiazepines 06/20/2024 NONE DETECTED  NONE DETECTED Final   Amphetamines 06/20/2024 NONE DETECTED  NONE DETECTED Final   Tetrahydrocannabinol 06/20/2024 NONE DETECTED  NONE DETECTED Final   Barbiturates 06/20/2024 NONE DETECTED  NONE DETECTED Final   Comment: (NOTE) DRUG SCREEN FOR MEDICAL PURPOSES ONLY.  IF CONFIRMATION IS NEEDED FOR ANY PURPOSE, NOTIFY LAB WITHIN 5 DAYS.  LOWEST DETECTABLE LIMITS FOR URINE DRUG SCREEN Drug Class                     Cutoff (ng/mL) Amphetamine and metabolites    1000 Barbiturate and metabolites    200 Benzodiazepine                 200 Opiates and metabolites        300 Cocaine and metabolites        300 THC                            50 Performed at [facility], [address] [place], [place] [address]    Sodium 06/20/2024 135  135 - 145 mmol/L Final   Potassium 06/20/2024 3.8  3.5 - 5.1 mmol/L Final   Chloride 06/20/2024 103  98 - 111 mmol/L Final   BUN 06/20/2024 6  6 - 20 mg/dL Final   Creatinine, Ser 06/20/2024 0.80  0.61 - 1.24 mg/dL Final   Glucose, Bld 90/98/7974 90  70 -  99 mg/dL Final   Glucose reference range applies only to samples taken after fasting for at least 8 hours.   Calcium , Ion 06/20/2024 1.01 (L)  1.15 - 1.40 mmol/L Final   TCO2 06/20/2024 19 (L)  22 - 32  mmol/L Final   Hemoglobin 06/20/2024 12.9 (L)  13.0 - 17.0 g/dL Final   HCT 90/98/7974 38.0 (L)  39.0 - 52.0 % Final   Calprotectin, Fecal 06/20/2024 22  0 - 120 ug/g Final   Comment: (NOTE) Concentration     Interpretation   Follow-Up < 5 - 50 ug/g     Normal           None >50 -120 ug/g     Borderline       Re-evaluate in 4-6 weeks    >120 ug/g     Abnormal         Repeat as clinically                                   indicated Performed At: [facility] [address] [place], [place] [id number] Jennette Shorter MD Ey:[id number]    Magnesium  06/20/2024 1.7  1.7 - 2.4 mg/dL Final   Performed at [facility], [address] [place], [place] [address]   Phosphorus 06/20/2024 2.7  2.5 - 4.6 mg/dL Final   Performed at [facility], [address], [place], [place] [address]   LDH 06/20/2024 220 (H)  98 - 192 U/L Final   Performed at [facility], [address] [place], [place] [address]   WBC 06/21/2024 4.8  4.0 - 10.5 K/uL Final   RBC 06/21/2024 3.20 (L)  4.22 - 5.81 MIL/uL Final   Hemoglobin 06/21/2024 10.6 (L)  13.0 - 17.0 g/dL Final   HCT 90/97/7974 31.8 (L)  39.0 - 52.0 % Final   MCV 06/21/2024 99.4  80.0 - 100.0 fL Final   MCH 06/21/2024 33.1  26.0 - 34.0 pg Final   MCHC 06/21/2024 33.3  30.0 - 36.0 g/dL Final   RDW 90/97/7974 14.0  11.5 - 15.5 % Final   Platelets 06/21/2024 270  150 - 400 K/uL Final   nRBC 06/21/2024 0.0  0.0 - 0.2 % Final   Performed at [facility], [address] [place], [place] [address]   Sodium 06/21/2024 135  135 - 145 mmol/L Final   Potassium 06/21/2024 3.4 (L)  3.5 - 5.1 mmol/L Final   Chloride 06/21/2024 102  98 - 111 mmol/L Final   CO2 06/21/2024 19 (L)  22 - 32 mmol/L Final   Glucose, Bld 06/21/2024 160 (H)  70 - 99 mg/dL Final   Glucose reference range applies only to samples taken after fasting for at least 8 hours.   BUN 06/21/2024 11  6 - 20 mg/dL Final   Creatinine, Ser 06/21/2024 1.39 (H)  0.61 - 1.24  mg/dL Final   Calcium  06/21/2024 8.3 (L)  8.9 - 10.3 mg/dL Final   GFR, Estimated 06/21/2024 58 (L)  >60 mL/min Final   Comment: (NOTE) Calculated using the CKD-EPI Creatinine Equation (2021)    Anion gap 06/21/2024 14  5 - 15 Final   Performed at [facility], [address], [place], [place] [address]   Iron 06/21/2024 111  45 - 182 ug/dL Final   TIBC 90/97/7974 309  250 - 450 ug/dL Final   Saturation  Ratios 06/21/2024 36  17.9 - 39.5 % Final   UIBC 06/21/2024 198  ug/dL Final   Performed at [facility], [address] [place], [place] [address]   Ferritin 06/21/2024 46  24 - 336 ng/mL Final   Performed at [facility], [address], [place], [place] [address]   TSH 06/21/2024 5.705 (H)  0.350 - 4.500 uIU/mL Final   Comment: Performed by a 3rd Generation assay with a functional sensitivity of <=0.01 uIU/mL. Performed at [facility], [address], [place], [place] [address]    CRP 06/21/2024 0.5  <1.0 mg/dL Final   Performed at [facility], [address], [place], [place] [address]   Glucose-Capillary 06/20/2024 161 (H)  70 - 99 mg/dL Final   Glucose reference range applies only to samples taken after fasting for at least 8 hours.   WBC 06/22/2024 5.1  4.0 - 10.5 K/uL Final   RBC 06/22/2024 2.88 (L)  4.22 - 5.81 MIL/uL Final   Hemoglobin 06/22/2024 9.6 (L)  13.0 - 17.0 g/dL Final   HCT 90/96/7974 28.7 (L)  39.0 - 52.0 % Final   MCV 06/22/2024 99.7  80.0 - 100.0 fL Final   MCH 06/22/2024 33.3  26.0 - 34.0 pg Final   MCHC 06/22/2024 33.4  30.0 - 36.0 g/dL Final   RDW 90/96/7974 14.2  11.5 - 15.5 % Final   Platelets 06/22/2024 268  150 - 400 K/uL Final   nRBC 06/22/2024 0.0  0.0 - 0.2 % Final   Performed at [facility], [address] [place], [place] [address]   Sodium 06/22/2024 138  135 - 145 mmol/L Final   Potassium 06/22/2024 4.2  3.5 - 5.1 mmol/L Final   Chloride 06/22/2024 109  98 - 111 mmol/L Final   CO2 06/22/2024 21 (L)  22 -  32 mmol/L Final   Glucose, Bld 06/22/2024 98  70 - 99 mg/dL Final   Glucose reference range applies only to samples taken after fasting for at least 8 hours.   BUN 06/22/2024 17  6 - 20 mg/dL Final   Creatinine, Ser 06/22/2024 1.11  0.61 - 1.24 mg/dL Final   Calcium  06/22/2024 8.1 (L)  8.9 - 10.3 mg/dL Final   Phosphorus 90/96/7974 2.6  2.5 - 4.6 mg/dL Final   Albumin 90/96/7974 3.2 (L)  3.5 - 5.0 g/dL Final   GFR, Estimated 06/22/2024 >60  >60 mL/min Final   Comment: (NOTE) Calculated using the CKD-EPI Creatinine Equation (2021)    Anion gap 06/22/2024 8  5 - 15 Final   Performed at [facility], [address] [place], [place] [address]   WBC 06/23/2024 5.9  4.0 - 10.5 K/uL Final   RBC 06/23/2024 2.85 (L)  4.22 - 5.81 MIL/uL Final   Hemoglobin 06/23/2024 9.5 (L)  13.0 - 17.0 g/dL Final   HCT 90/95/7974 29.0 (L)  39.0 - 52.0 % Final   MCV 06/23/2024 101.8 (H)  80.0 - 100.0 fL Final   MCH 06/23/2024 33.3  26.0 - 34.0 pg Final   MCHC 06/23/2024 32.8  30.0 - 36.0 g/dL Final   RDW 90/95/7974 14.3  11.5 - 15.5 % Final   Platelets 06/23/2024 269  150 - 400 K/uL Final   nRBC 06/23/2024 0.0  0.0 - 0.2 % Final   Performed at [facility], [address], [place], [place] [address]   Sodium 06/23/2024 140  135 - 145  mmol/L Final   Potassium 06/23/2024 3.7  3.5 - 5.1 mmol/L Final   Chloride 06/23/2024 108  98 - 111 mmol/L Final   CO2 06/23/2024 20 (L)  22 - 32 mmol/L Final   Glucose, Bld 06/23/2024 107 (H)  70 - 99 mg/dL Final   Glucose reference range applies only to samples taken after fasting for at least 8 hours.   BUN 06/23/2024 11  6 - 20 mg/dL Final   Creatinine, Ser 06/23/2024 0.88  0.61 - 1.24 mg/dL Final   Calcium  06/23/2024 8.2 (L)  8.9 - 10.3 mg/dL Final   GFR, Estimated 06/23/2024 >60  >60 mL/min Final   Comment: (NOTE) Calculated using the CKD-EPI Creatinine Equation (2021)    Anion gap 06/23/2024 12  5 - 15 Final   Performed at [facility], [address], [place], [place] [address]   WBC 06/24/2024 6.1  4.0 - 10.5 K/uL Final   RBC 06/24/2024 3.10 (L)  4.22 - 5.81 MIL/uL Final   Hemoglobin 06/24/2024 10.2 (L)  13.0 - 17.0 g/dL Final   HCT 90/94/7974 32.2 (L)  39.0 - 52.0 % Final   MCV 06/24/2024 103.9 (H)  80.0 - 100.0 fL Final   MCH 06/24/2024 32.9  26.0 - 34.0 pg Final   MCHC 06/24/2024 31.7  30.0 - 36.0 g/dL Final   RDW 90/94/7974 14.6  11.5 - 15.5 % Final   Platelets 06/24/2024 298  150 - 400 K/uL Final   nRBC 06/24/2024 0.0  0.0 - 0.2 % Final   Performed at [facility], [address] [place], [place] [address]   Sodium 06/24/2024 140  135 - 145 mmol/L Final   Potassium 06/24/2024 3.8  3.5 - 5.1 mmol/L Final   Chloride 06/24/2024 108  98 - 111 mmol/L Final   CO2 06/24/2024 19 (L)  22 - 32 mmol/L Final   Glucose, Bld 06/24/2024 101 (H)  70 - 99 mg/dL Final   Glucose reference range applies only to samples taken after fasting for at least 8 hours.   BUN 06/24/2024 6  6 - 20 mg/dL Final   Creatinine, Ser 06/24/2024 0.92  0.61 - 1.24 mg/dL Final   Calcium  06/24/2024 8.6 (L)  8.9 - 10.3 mg/dL Final   GFR, Estimated 06/24/2024 >60  >60 mL/min Final   Comment: (NOTE) Calculated using the CKD-EPI Creatinine Equation (2021)    Anion gap 06/24/2024 13  5 - 15 Final   Performed at [facility], [address], [place], [place] [address]   SURGICAL PATHOLOGY 06/24/2024    Final-Edited                   Value:SURGICAL PATHOLOGY CASE: [id number] PATIENT: NORLEEN Shelnutt Surgical Pathology Report     Clinical History: GI bleeding, R/O ulcerative colitis, dysplasia (cm)     FINAL MICROSCOPIC DIAGNOSIS:  A. COLON, RIGHT, BIOPSY: - Unremarkable colonic mucosa. - Negative for granulomas, dysplasia, and malignancy.  B. COLON POLYP; POLYPECTOMY: - Tubular adenoma. - Negative for high-grade dysplasia and malignancy.  C. COLON, LEFT, BIOPSY: - Unremarkable colonic mucosa. - Negative for granulomas, dysplasia, and  malignancy.  GROSS DESCRIPTION:  A. Received in formalin labeled with the patients name and Right colon is a 1.5 x 1.3 x 0.2 cm aggregate of tan soft tissue fragments, submitted in toto in a single cassette.  B. Received in formalin labeled with the patients name and Colon polyp is a 0.5 cm piece of tan soft  tissue, submitted in toto in a single cassette.  C. Received in formalin labeled with the patients name and Left colon bx is a 1.7 x 1.5 x 0.2                          cm aggregate of tan soft tissue fragments, submitted in toto in a single cassette.  (LEF 06/25/2024)  Final Diagnosis performed by Rexene Daily, MD.   Electronically signed 06/28/2024 Technical component performed at [facility]. [facility], [address] [place], [place] [address].  Professional component performed at [facility]. [address] [place], [place] [address]  Immunohistochemistry Technical component (if applicable) was performed at [employer]. [address], [place], [place] [address].  IMMUNOHISTOCHEMISTRY DISCLAIMER (if applicable): Some of these immunohistochemical stains may have been developed and the performance characteristics determine by [facility]. Some may not have been cleared or approved by the U.S. Food and Drug Administration. The FDA has determined that such clearance or approval is not necessary. This test is used for clinical purposes. It should not be rega                         rded as investigational or for research. This laboratory is certified under the Clinical Laboratory Improvement Amendments of 1988 (CLIA-88) as qualified to perform high complexity clinical laboratory testing.  The controls stained appropriately.     Blood Alcohol level:  Lab Results  Component Value Date   ETH 52 (H) 11/22/2024   ETH <15 06/20/2024    Metabolic Disorder Labs: Lab Results  Component  Value Date   HGBA1C 6.1 (H) 11/22/2024   MPG 128.37 11/22/2024   MPG 119.76 04/26/2024   Lab Results  Component Value Date   PROLACTIN 32.0 (H) 11/05/2015   Lab Results  Component Value Date   CHOL 201 (H) 11/22/2024   TRIG 132 11/22/2024   HDL 80 11/22/2024   CHOLHDL 2.5 11/22/2024   VLDL 26 11/22/2024   LDLCALC 95 11/22/2024   LDLCALC 52 06/29/2020    Therapeutic Lab Levels: No results found for: LITHIUM No results found for: VALPROATE No results found for: CBMZ  Physical Findings   AIMS    Flowsheet Row [facility]) [facility]) [facility]  AIMS Total Score 0 0   AUDIT    Flowsheet Row Admission (Discharged) from 01/04/2024 in [facility] Office Visit from 06/29/2020 in [facility] Admission (Discharged[facility] Ad[facility] (Discharged) [facility] Ad[facility] (Dis[facility]  Alcohol Use Disorder Identification Test Final Score (AUDIT) 6 10 27  34 38   GAD-7    Flowsheet Row Office Visit from 06/29/2020 in [facility]  Total GAD-7 Score 15   PHQ2-9    Flowsheet Row ED from 11/22/2024 in [facility] Office Visit from 06/29/2020 in [facility]  PHQ-2 Total Score 5 4  PHQ-9 Total Score 17 19   Flowsheet Row ED from 11/22/2024 in [facility] Most recent reading at 11/23/2024 12:35 AM ED from 11/22/2024 in [facility] Most recent reading at 11/22/2024  3:50 PM ED from  11/21/2024 in [facility] Most recent reading at 11/21/2024  3:36 PM  C-SSRS RISK CATEGORY No Risk No Risk No Risk      Musculoskeletal  Strength & Muscle Tone: within normal limits Gait & Station: normal   Psychiatric Specialty Exam  Presentation  General Appearance:  Appropriate for Environment  Eye Contact: Good  Speech: Clear and Coherent  Speech Volume: Normal  Handedness: Right   Mood and Affect  Mood: Euthymic  Affect: Flat   Thought Process  Thought Processes: Coherent  Descriptions of Associations:Intact  Orientation:Full (Time, Place and Person)  Thought Content:Logical  Diagnosis of Schizophrenia or Schizoaffective disorder in past: Yes  Duration of Psychotic Symptoms: Greater than six months   Hallucinations:Hallucinations: Auditory Description of Auditory Hallucinations: I hear noises, like voices that scream, all the time  Ideas of Reference:None  Suicidal Thoughts:Suicidal Thoughts: No  Homicidal Thoughts:Homicidal Thoughts: No   Sensorium  Memory: Recent Good  Judgment: Fair  Insight: Fair   Art Therapist  Concentration: Good  Attention Span: Good  Recall: Good  Fund of Knowledge: Good  Language: Good   Psychomotor Activity  Psychomotor Activity: Psychomotor Activity: Normal   Assets  Assets: Desire for Improvement   Sleep  Sleep: Sleep: Fair  Estimated Sleeping Duration (Last 24 Hours): 7.00-8.25 hours  Nutritional Assessment (For OBS and [facility]BC admissions only) Has the patient had a weight loss or gain of 10 pounds or more in the last 3 months?: No Has the patient had a decrease in food intake/or appetite?: No Does the patient have dental problems?: No Does the patient have eating habits or behaviors that may be indicators of an eating disorder including binging or inducing vomiting?: No Has the patient recently lost weight without trying?: 0 Has the patient been eating poorly because of a decreased appetite?: 0 Malnutrition Screening Tool Score: 0    Physical Exam  Physical Exam Vitals and nursing  note reviewed.  Constitutional:      Appearance: Normal appearance.  HENT:     Head: Normocephalic.     Nose: Nose normal.  Eyes:     Extraocular Movements: Extraocular movements intact.  Cardiovascular:     Rate and Rhythm: Normal rate.  Pulmonary:     Effort: Pulmonary effort is normal.  Musculoskeletal:        General: Normal range of motion.     Cervical back: Normal range of motion.  Neurological:     General: No focal deficit present.     Mental Status: He is alert and oriented to person, place, and time.    Review of Systems  Constitutional: Negative.   HENT: Negative.    Eyes: Negative.   Respiratory: Negative.    Cardiovascular: Negative.   Gastrointestinal: Negative.   Genitourinary: Negative.   Musculoskeletal:  Positive for joint pain.  Neurological: Negative.   Endo/Heme/Allergies: Negative.   Psychiatric/Behavioral:  Positive for hallucinations and substance abuse.    Blood pressure 106/74, pulse 89, temperature 98.8 F (37.1 C), temperature source Oral, resp. rate 20, SpO2 100%. There is no height or weight on file to calculate BMI.  Treatment Plan Summary: Daily contact with patient to assess and evaluate symptoms and progress in treatment Will maintain Q 15 minutes observation for safety.  Estimated LOS:  3-5 days Reviewed admission lab:BAL 52 and UDS positive for Oxycodone , as prescribed.  Patient will participate in  group programming.  Medication management: Continue home medications   Atorvastatin  80mg  at bedtime  Calcium  - Vitamin D  daily at lunch   Duloxetine  60mg  BID    Famotidine  20 BID   Folic acid  1mg  daily    Gabapentin  300mg  TID PRN for pain   Synthroid  88mcg daily before breakfast   Mesalamine  4.8g daily with breakfast    Metformin  500mg  daily   Metoprolol  100mg  daily   Multivitamin daily    Olanzapine  15mg  at bedtime    Pantoprazole  40mg  BID   Entresto  49-51mg  BID   Topamax  50mg  BID    Other PRNS  Tylenol  650 mg every 6 hours  PRN for mild pain Maalox 30 mg every 4 hrs PRN for indigestion Trazodone  50mg  PRN for sleep  Zofran  4mg  for nausea Imodium  2-4mg  PRN for diarrhea Hydroxyzine  25mg  TID PRN for anxiety  Docusate Sodium  100mg  daily PRN for constipation  Continue CIWA protocol with PRN Ativan  for alcohol withdrawal symptoms. Pt has hx of withdrawal seizures.   Discharge Planning: Social work and case management to assist with discharge planning and identification of hospital follow-up needs prior to discharge Estimated LOS: 3-5 days Discharge Concerns: Need to establish a safety plan; Medication compliance and effectiveness Discharge Goals: Return home with outpatient referrals for mental health follow-up including medication management/psychotherapy. [facility]        Alan Mcardle, NP 11/24/2024 9:43 PM

## 2024-11-24 NOTE — ED Notes (Signed)
 Assumed care of pt at  1900. Pt cooperative but still very flat. No inappropriate behaviors observed or reported at this time. Environment secured. Safety checks in place per facility protocol.

## 2024-11-24 NOTE — Group Note (Signed)
 Group Topic: Positive Affirmations  Group Date: 11/24/2024 Start Time: 2015 End Time: 2045 Facilitators: Lauree Pickle, NT  Department: [facility]  Number of Participants: 5  Group Focus: feeling awareness/expression Treatment Modality:  Leisure Development Interventions utilized were group exercise Purpose: express feelings  Name: Jacob Adams Date of Birth: 02-Aug-1966  MR: [id number]    Level of Participation: moderate Quality of Participation: attentive Interactions with others: gave feedback Mood/Affect: appropriate Triggers (if applicable): N/A Cognition: coherent/clear Progress: Moderate Response: Good Plan: follow-up needed  Patients Problems:  Patient Active Problem List   Diagnosis Date Noted   Hematochezia 06/24/2024   Hemorrhoids 06/24/2024   Polyp of ascending colon 06/24/2024   Lower GI bleed 06/20/2024   Anemia 05/12/2024   Acute lower GI bleeding 05/11/2024   Alcohol use disorder 05/11/2024   GERD (gastroesophageal reflux disease) 05/11/2024   Pain due to total left knee replacement 05/11/2024   Status post total left knee replacement 05/05/2024   Primary osteoarthritis of left knee 05/04/2024   Syncope 03/10/2024   Diarrhea 03/10/2024   Hypotension 01/17/2024   Chronic health problem 01/15/2024   AKI (acute kidney injury) 01/15/2024   History of seizure due to alcohol withdrawal 01/15/2024   Abdominal pain 01/15/2024   Major depressive disorder, 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   Major depressive disorder, recurrent, severe without psychotic behavior (HCC) 01/02/2024   Low back pain 12/31/2023   Lactic acid acidosis 12/31/2023   Low glucose level 12/31/2023   Suicidal behavior 12/31/2023   Chronic colitis 04/25/2022   Alcoholic cirrhosis of liver without ascites (HCC) 04/25/2022   Portal hypertensive gastropathy (HCC) 04/25/2022   History of anemia 04/25/2022   Hyponatremia  01/23/2022   High anion gap metabolic acidosis 01/23/2022   Hyperkalemia 01/23/2022   Fatty liver 01/23/2022   Insomnia 10/04/2020   AMS (altered mental status) 07/23/2020   Sleep apnea    Hypertension    Acute metabolic encephalopathy 01/24/2020   Normocytic anemia 08/15/2019   Hypothyroidism 08/15/2019   HLD (hyperlipidemia) 08/15/2019   Anxiety and depression 08/15/2019   Chronic systolic CHF (congestive heart failure) (HCC) 02/08/2019   Tobacco dependence 02/08/2019   Alcohol withdrawal (HCC) 01/2019   HTN (hypertension) 12/24/2018   Alcohol abuse 01/06/2016   MDD (major depressive disorder), recurrent, severe, 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/03/2015   Alcohol use disorder, severe, dependence (HCC) 11/03/2015   History of GI bleed 10/31/2015   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

## 2024-11-24 NOTE — ED Provider Notes (Cosign Needed)
 Behavioral Health Progress Note  Date and Time: 11/24/2024 11:27 AM Name: Jacob Adams MRN:  [id number]  Subjective: Jacob states I relapsed about a month ago but I want to stop drinking and get my life back on track.   He presented on 11/22/2024 voluntarily for alcohol detox after relapsing - admits to drinking 15 beers and 1/5th of liquor daily for one week. He is a veteran - [facility]. Pt desires to commit to a similar program upon discharge. He reports chronic auditory hallucinations people yelling - which have reduced significantly since admission. States the voices have always been there but it's less. Denies having alcohol withdrawal symptoms. Flat affect. Stable appetite. Sleep has been fair - left knee pain (TKR 04/2024) interfering with sleep. Denies SI or HI. Reports migraine today - has used ubrelvy  for this in the past. Looking forward to discharge to an intensive outpatient program.   Diagnosis:  Final diagnoses:  Alcohol use disorder, severe, dependence (HCC)  MDD (major depressive disorder), recurrent, severe, [name]    Total Time spent with patient: 20 minutes  Past Psychiatric History: Alcohol use disorder, schizophrenia, MDD. Past Medical History:  Past Medical History:  Diagnosis Date   Acid reflux    Alcohol withdrawal (HCC) 01/2019   Anxiety    Arthritis    toes (07/26/2014)   CHF (congestive heart failure) (HCC)    Depression    Diabetes mellitus without complication (HCC)    Headache(784.0)    weekly (07/26/2014)   History of blood transfusion ~ 2000   related to nose bleeding   History of stomach ulcers    Hypertension    Lower GI bleeding admitted 07/26/2014   Mental disorder    Migraine    @ least monthly (07/26/2014)   Pancreatitis    Rectal bleeding 07/26/2014   Sleep apnea    haven't been RX'd mask yet (07/26/2014)    Family History: No reported family  history. Family Psychiatric  History: alcoholism - father and brother Social History: Patient resides in [place] with his son, son's girlfriend, and grandson. He receives disability income. [facility] case manager has located an employment opportunity for patient, scheduled to start soon. Denies access to weapons. Patient identifies his wife as an emotional support, she does not reside in the home.   Additional Social History:                         Sleep: Fair  Appetite:  Good  Current Medications:  Current Facility-Administered Medications  Medication Dose Route Frequency Provider Last Rate Last Admin   acetaminophen  (TYLENOL ) tablet 650 mg  650 mg Oral Q6H PRN Trudy Carwin, NP       albuterol  (VENTOLIN  HFA) 108 (90 Base) MCG/ACT inhaler 2 puff  2 puff Inhalation Q6H PRN Trudy Carwin, NP       alum & mag hydroxide-simeth (MAALOX/MYLANTA) 200-200-20 MG/5ML suspension 30 mL  30 mL Oral Q4H PRN Trudy Carwin, NP       atorvastatin  (LIPITOR) tablet 80 mg  80 mg Oral QHS Trudy Carwin, NP   80 mg at 11/23/24 2109   calcium -vitamin D  (OSCAL WITH D) 500-5 MG-MCG per tablet 1 tablet  1 tablet Oral Q lunch Nkwenti, Doris, NP       haloperidol  (HALDOL ) tablet 5 mg  5 mg Oral TID PRN Trudy Carwin, NP       And  diphenhydrAMINE  (BENADRYL ) capsule 50 mg  50 mg Oral TID PRN Trudy Carwin, NP       haloperidol  lactate (HALDOL ) injection 5 mg  5 mg Intramuscular TID PRN Trudy Carwin, NP       And   diphenhydrAMINE  (BENADRYL ) injection 50 mg  50 mg Intramuscular TID PRN Trudy Carwin, NP       And   LORazepam  (ATIVAN ) injection 2 mg  2 mg Intramuscular TID PRN Trudy Carwin, NP       haloperidol  lactate (HALDOL ) injection 10 mg  10 mg Intramuscular TID PRN Trudy Carwin, NP       And   diphenhydrAMINE  (BENADRYL ) injection 50 mg  50 mg Intramuscular TID PRN Trudy Carwin, NP       And   LORazepam  (ATIVAN ) injection 2 mg  2 mg Intramuscular TID PRN Trudy Carwin, NP       docusate sodium   (COLACE) capsule 100 mg  100 mg Oral Daily PRN Trudy Carwin, NP       DULoxetine  (CYMBALTA ) DR capsule 60 mg  60 mg Oral BID Nkwenti, Doris, NP   60 mg at 11/24/24 9068   famotidine  (PEPCID ) tablet 20 mg  20 mg Oral BID Nkwenti, Doris, NP   20 mg at 11/24/24 0932   folic acid  (FOLVITE ) tablet 1 mg  1 mg Oral Daily Trudy Carwin, NP   1 mg at 11/24/24 0930   gabapentin  (NEURONTIN ) capsule 300 mg  300 mg Oral TID PRN Trudy Carwin, NP       hydrOXYzine  (ATARAX ) tablet 25 mg  25 mg Oral Q6H PRN Trudy Carwin, NP   25 mg at 11/22/24 2240   latanoprost  (XALATAN ) 0.005 % ophthalmic solution 1 drop  1 drop Both Eyes Daily Trudy Carwin, NP   1 drop at 11/24/24 0933   levothyroxine  (SYNTHROID ) tablet 88 mcg  88 mcg Oral Q0600 Trudy Carwin, NP   88 mcg at 11/24/24 9171   loperamide  (IMODIUM ) capsule 2-4 mg  2-4 mg Oral PRN Trudy Carwin, NP       LORazepam  (ATIVAN ) tablet 1 mg  1 mg Oral Q6H PRN Trudy Carwin, NP       magnesium  hydroxide (MILK OF MAGNESIA) suspension 30 mL  30 mL Oral Daily PRN Trudy Carwin, NP       mesalamine  (LIALDA ) EC tablet 4.8 g  4.8 g Oral Q breakfast Nkwenti, Doris, NP       metFORMIN  (GLUCOPHAGE -XR) 24 hr tablet 500 mg  500 mg Oral Q breakfast Tex Drilling, NP   500 mg at 11/24/24 9170   metoprolol  succinate (TOPROL -XL) 24 hr tablet 100 mg  100 mg Oral Daily Nkwenti, Doris, NP   100 mg at 11/24/24 0825   multivitamin with minerals tablet 1 tablet  1 tablet Oral Daily Trudy Carwin, NP   1 tablet at 11/24/24 0932   nicotine  (NICODERM CQ  - dosed in mg/24 hours) patch 21 mg  21 mg Transdermal Daily Nkwenti, Doris, NP   21 mg at 11/24/24 9066   OLANZapine  (ZYPREXA ) tablet 15 mg  15 mg Oral QHS Tex Drilling, NP   15 mg at 11/23/24 2110   ondansetron  (ZOFRAN -ODT) disintegrating tablet 4 mg  4 mg Oral Q6H PRN Trudy Carwin, NP       pantoprazole  (PROTONIX ) EC tablet 40 mg  40 mg Oral BID Nkwenti, Doris, NP   40 mg at 11/24/24 9068   sacubitril -valsartan  (ENTRESTO ) 49-51 mg per  tablet  1 tablet Oral BID Tex Drilling, NP  1 tablet at 11/24/24 0932   thiamine  (VITAMIN B1) tablet 100 mg  100 mg Oral Daily Trudy Carwin, NP   100 mg at 11/24/24 9068   topiramate  (TOPAMAX ) tablet 50 mg  50 mg Oral BID Tex Drilling, NP   50 mg at 11/24/24 0930   traZODone  (DESYREL ) tablet 50 mg  50 mg Oral QHS PRN Trudy Carwin, NP   50 mg at 11/22/24 2240   Current Outpatient Medications  Medication Sig Dispense Refill   albuterol  (VENTOLIN  HFA) 108 (90 Base) MCG/ACT inhaler Inhale 2 puffs into the lungs every 6 (six) hours as needed for wheezing or shortness of breath.     alprostadil  (EDEX ) 40 MCG injection 40 mcg by Intracavitary route See admin instructions. Inject every 96 hours (every 4 days) as needed into penis. Seek medical attention for erection lasting more than 4 hours     ammonium lactate  (LAC-HYDRIN ) 12 % lotion Apply 1 Application topically daily.     atorvastatin  (LIPITOR) 80 MG tablet Take 1 tablet (80 mg total) by mouth at bedtime. (Patient taking differently: Take 80 mg by mouth in the morning.) 30 tablet 0   Calcium  Carb-Cholecalciferol  (CALTRATE 600+D3 PO) Take 1 tablet by mouth in the morning.     docusate sodium  (COLACE) 100 MG capsule Take 1 capsule (100 mg total) by mouth daily as needed. (Patient taking differently: Take 100 mg by mouth daily.) 30 capsule 2   DULoxetine  (CYMBALTA ) 60 MG capsule Take 60 mg by mouth 2 (two) times daily.     famotidine  (PEPCID ) 20 MG tablet Take 20 mg by mouth 2 (two) times daily.     hydrocortisone  (ANUSOL -HC) 2.5 % rectal cream Place 1 Application rectally daily as needed for hemorrhoids or anal itching.     latanoprost  (XALATAN ) 0.005 % ophthalmic solution Place 1 drop into both eyes at bedtime.     levothyroxine  (SYNTHROID ) 88 MCG tablet Take 88 mcg by mouth daily before breakfast.     mesalamine  (LIALDA ) 1.2 g EC tablet Take 4 tablets (4.8 g total) by mouth daily with breakfast. 120 tablet 1   metFORMIN  (GLUCOPHAGE -XR) 500 MG  24 hr tablet Take 500 mg by mouth every morning.     metoprolol  succinate (TOPROL -XL) 100 MG 24 hr tablet Take 100 mg by mouth daily. Take with or immediately following a meal. Hold for SBP less than 100 and/or heart rate less than 60     Multiple Vitamin (MULTIVITAMIN WITH MINERALS) TABS tablet Take 1 tablet by mouth daily. 30 tablet 0   naproxen  (NAPROSYN ) 375 MG tablet Take 375 mg by mouth 2 (two) times daily as needed (For pain).     nicotine  (NICODERM CQ  - DOSED IN MG/24 HOURS) 21 mg/24hr patch Place 21 mg onto the skin daily.     nicotine  polacrilex (COMMIT) 2 MG lozenge Take 2 mg by mouth every 2 (two) hours as needed for smoking cessation.     OLANZapine  (ZYPREXA ) 15 MG tablet Take 15 mg by mouth at bedtime.     oxyCODONE  (OXY IR/ROXICODONE ) 5 MG immediate release tablet Take 10 mg by mouth 3 (three) times daily as needed (For pain).     pantoprazole  (PROTONIX ) 40 MG tablet Take 1 tablet (40 mg total) by mouth 2 (two) times daily. 60 tablet 0   Phenylephrine  HCl 0.25 % SUPP Place 1 suppository rectally daily as needed (For hemorrhoids).     sacubitril -valsartan  (ENTRESTO ) 49-51 MG Take 1 tablet by mouth 2 (two) times daily. 60 tablet  0   topiramate  (TOPAMAX ) 50 MG tablet Take 50 mg by mouth 2 (two) times daily.     Ubrogepant  (UBRELVY ) 50 MG TABS Take 50 mg by mouth daily as needed (for migraines).      Labs  Lab Results:  Admission on 11/22/2024  Component Date Value Ref Range Status   Glucose-Capillary 11/22/2024 111 (H)  70 - 99 mg/dL Final   Glucose reference range applies only to samples taken after fasting for at least 8 hours.  Admission on 11/22/2024, Discharged on 11/22/2024  Component Date Value Ref Range Status   Sodium 11/22/2024 136  135 - 145 mmol/L Final   Potassium 11/22/2024 4.6  3.5 - 5.1 mmol/L Final   Chloride 11/22/2024 99  98 - 111 mmol/L Final   CO2 11/22/2024 19 (L)  22 - 32 mmol/L Final   Glucose, Bld 11/22/2024 84  70 - 99 mg/dL Final   Glucose reference  range applies only to samples taken after fasting for at least 8 hours.   BUN 11/22/2024 12  6 - 20 mg/dL Final   Creatinine, Ser 11/22/2024 1.03  0.61 - 1.24 mg/dL Final   Calcium  11/22/2024 9.3  8.9 - 10.3 mg/dL Final   Total Protein 97/96/7973 8.3 (H)  6.5 - 8.1 g/dL Final   Albumin 97/96/7973 4.8  3.5 - 5.0 g/dL Final   AST 97/96/7973 40  15 - 41 U/L Final   HEMOLYSIS AT THIS LEVEL MAY AFFECT RESULT   ALT 11/22/2024 32  0 - 44 U/L Final   Alkaline Phosphatase 11/22/2024 84  38 - 126 U/L Final   Total Bilirubin 11/22/2024 0.5  0.0 - 1.2 mg/dL Final   GFR, Estimated 11/22/2024 >60  >60 mL/min Final   Comment: (NOTE) Calculated using the CKD-EPI Creatinine Equation (2021)    Anion gap 11/22/2024 19 (H)  5 - 15 Final   Performed at [facility] Lab, [address], [place], [place] [address]   Hgb A1c MFr Bld 11/22/2024 6.1 (H)  4.8 - 5.6 % Final   Comment: (NOTE) Diagnosis of Diabetes The following HbA1c ranges recommended by the American Diabetes Association (ADA) may be used as an aid in the diagnosis of diabetes mellitus.  Hemoglobin             Suggested A1C NGSP%              Diagnosis  <5.7                   Non Diabetic  5.7-6.4                Pre-Diabetic  >6.4                   Diabetic  <7.0                   Glycemic control for                       adults with diabetes.     Mean Plasma Glucose 11/22/2024 128.37  mg/dL Final   Performed at [facility] Lab, [address], [place], [place] [address]   Magnesium  11/22/2024 2.3  1.7 - 2.4 mg/dL Final   Performed at [facility] Lab, [address], [place], [place] [address]   Alcohol, Ethyl (B) 11/22/2024 52 (H)  <15 mg/dL Final   Comment: (NOTE) For medical purposes only. Performed at [facility] Lab,  [address], [place], [place] [address]    Cholesterol 11/22/2024 201 (H)  0 - 200 mg/dL Final   Comment:        ATP III CLASSIFICATION:  <200     mg/dL   Desirable  799-760  mg/dL   Borderline  High  >=759    mg/dL   High           Triglycerides 11/22/2024 132  <150 mg/dL Final   HDL 97/96/7973 80  >40 mg/dL Final   Total CHOL/HDL Ratio 11/22/2024 2.5  RATIO Final   VLDL 11/22/2024 26  0 - 40 mg/dL Final   LDL Cholesterol 11/22/2024 95  0 - 99 mg/dL Final   Comment:        Total Cholesterol/HDL:CHD Risk Coronary Heart Disease Risk Table                     Men   Women  1/2 Average Risk   3.4   3.3  Average Risk       5.0   4.4  2 X Average Risk   9.6   7.1  3 X Average Risk  23.4   11.0        Use the calculated Patient Ratio above and the CHD Risk Table to determine the patient's CHD Risk.        ATP III CLASSIFICATION (LDL):  <100     mg/dL   Optimal  899-870  mg/dL   Near or Above                    Optimal  130-159  mg/dL   Borderline  839-810  mg/dL   High  >809     mg/dL   Very High Performed at [facility] Lab, [address] [place], [place] [address]    TSH 11/22/2024 0.753  0.350 - 4.500 uIU/mL Final   Performed at [facility] Lab, [address], [place], [place] [address]   Color, Urine 11/22/2024 YELLOW  YELLOW Final   APPearance 11/22/2024 CLEAR  CLEAR Final   Specific Gravity, Urine 11/22/2024 1.009  1.005 - 1.030 Final   pH 11/22/2024 5.0  5.0 - 8.0 Final   Glucose, UA 11/22/2024 >=500 (A)  NEGATIVE mg/dL Final   Hgb urine dipstick 11/22/2024 SMALL (A)  NEGATIVE Final   Bilirubin Urine 11/22/2024 NEGATIVE  NEGATIVE Final   Ketones, ur 11/22/2024 5 (A)  NEGATIVE mg/dL Final   Protein, ur 97/96/7973 100 (A)  NEGATIVE mg/dL Final   Nitrite 97/96/7973 NEGATIVE  NEGATIVE Final   Leukocytes,Ua 11/22/2024 NEGATIVE  NEGATIVE Final   RBC / HPF 11/22/2024 0-5  0 - 5 RBC/hpf Final   WBC, UA 11/22/2024 0-5  0 - 5 WBC/hpf Final   Bacteria, UA 11/22/2024 NONE SEEN  NONE SEEN Final   Squamous Epithelial / HPF 11/22/2024 0-5  0 - 5 /HPF Final   Mucus 11/22/2024 PRESENT   Final   Hyaline Casts, UA 11/22/2024 PRESENT   Final   Performed at [facility] Lab, [address] [place], [place] [address]   POC Amphetamine UR 11/22/2024 None Detected  NONE DETECTED (Cut Off Level 1000 ng/mL) Final   POC Secobarbital (BAR) 11/22/2024 None Detected  NONE DETECTED (Cut Off Level 300 ng/mL) Final   POC Buprenorphine (BUP) 11/22/2024 None Detected  NONE DETECTED (Cut Off Level 10 ng/mL) Final   POC Oxazepam (BZO) 11/22/2024 None Detected  NONE DETECTED (Cut  Off Level 300 ng/mL) Final   POC Cocaine UR 11/22/2024 None Detected  NONE DETECTED (Cut Off Level 300 ng/mL) Final   POC Methamphetamine UR 11/22/2024 None Detected  NONE DETECTED (Cut Off Level 1000 ng/mL) Final   POC Morphine  11/22/2024 None Detected  NONE DETECTED (Cut Off Level 300 ng/mL) Final   POC Methadone UR 11/22/2024 None Detected  NONE DETECTED (Cut Off Level 300 ng/mL) Final   POC Oxycodone  UR 11/22/2024 Positive (A)  NONE DETECTED (Cut Off Level 100 ng/mL) Final   POC Marijuana UR 11/22/2024 None Detected  NONE DETECTED (Cut Off Level 50 ng/mL) Final  Admission on 06/20/2024, Discharged on 06/24/2024  Component Date Value Ref Range Status   WBC 06/20/2024 3.7 (L)  4.0 - 10.5 K/uL Final   RBC 06/20/2024 3.66 (L)  4.22 - 5.81 MIL/uL Final   Hemoglobin 06/20/2024 12.2 (L)  13.0 - 17.0 g/dL Final   HCT 90/98/7974 35.6 (L)  39.0 - 52.0 % Final   MCV 06/20/2024 97.3  80.0 - 100.0 fL Final   MCH 06/20/2024 33.3  26.0 - 34.0 pg Final   MCHC 06/20/2024 34.3  30.0 - 36.0 g/dL Final   RDW 90/98/7974 13.8  11.5 - 15.5 % Final   Platelets 06/20/2024 343  150 - 400 K/uL Final   nRBC 06/20/2024 0.0  0.0 - 0.2 % Final   Neutrophils Relative % 06/20/2024 62  % Final   Neutro Abs 06/20/2024 2.4  1.7 - 7.7 K/uL Final   Lymphocytes Relative 06/20/2024 23  % Final   Lymphs Abs 06/20/2024 0.8  0.7 - 4.0 K/uL Final   Monocytes Relative 06/20/2024 12  % Final   Monocytes Absolute 06/20/2024 0.4  0.1 - 1.0 K/uL Final   Eosinophils Relative 06/20/2024 0  % Final   Eosinophils Absolute 06/20/2024 0.0   0.0 - 0.5 K/uL Final   Basophils Relative 06/20/2024 2  % Final   Basophils Absolute 06/20/2024 0.1  0.0 - 0.1 K/uL Final   Immature Granulocytes 06/20/2024 1  % Final   Abs Immature Granulocytes 06/20/2024 0.02  0.00 - 0.07 K/uL Final   Performed at [facility] Lab, [address], [place], [place] [address]   Sodium 06/20/2024 134 (L)  135 - 145 mmol/L Final   Potassium 06/20/2024 4.1  3.5 - 5.1 mmol/L Final   Chloride 06/20/2024 98  98 - 111 mmol/L Final   CO2 06/20/2024 20 (L)  22 - 32 mmol/L Final   Glucose, Bld 06/20/2024 92  70 - 99 mg/dL Final   Glucose reference range applies only to samples taken after fasting for at least 8 hours.   BUN 06/20/2024 6  6 - 20 mg/dL Final   Creatinine, Ser 06/20/2024 0.84  0.61 - 1.24 mg/dL Final   Calcium  06/20/2024 8.9  8.9 - 10.3 mg/dL Final   Total Protein 90/98/7974 8.2 (H)  6.5 - 8.1 g/dL Final   Albumin 90/98/7974 4.1  3.5 - 5.0 g/dL Final   AST 90/98/7974 32  15 - 41 U/L Final   ALT 06/20/2024 15  0 - 44 U/L Final   Alkaline Phosphatase 06/20/2024 88  38 - 126 U/L Final   Total Bilirubin 06/20/2024 1.0  0.0 - 1.2 mg/dL Final   GFR, Estimated 06/20/2024 >60  >60 mL/min Final   Comment: (NOTE) Calculated using the CKD-EPI Creatinine Equation (2021)    Anion gap 06/20/2024 16 (H)  5 - 15 Final   Performed at [facility] Lab, [address] [place], [place] [address]   ABO/RH(D) 06/20/2024 O POS   Final   Antibody Screen 06/20/2024 NEG   Final   Sample Expiration 06/20/2024    Final                   Value:06/23/2024,2359 Performed at [facility] Lab, [address], [place], [place] [address]    Prothrombin Time 06/20/2024 13.5  11.4 - 15.2 seconds Final   INR 06/20/2024 1.0  0.8 - 1.2 Final   Comment: (NOTE) INR goal varies based on device and disease states. Performed at [facility] Lab, [address] [place], [place] [address]    Color, Urine 06/20/2024 STRAW (A)  YELLOW Final   APPearance 06/20/2024 CLEAR   CLEAR Final   Specific Gravity, Urine 06/20/2024 1.005  1.005 - 1.030 Final   pH 06/20/2024 7.0  5.0 - 8.0 Final   Glucose, UA 06/20/2024 NEGATIVE  NEGATIVE mg/dL Final   Hgb urine dipstick 06/20/2024 NEGATIVE  NEGATIVE Final   Bilirubin Urine 06/20/2024 NEGATIVE  NEGATIVE Final   Ketones, ur 06/20/2024 5 (A)  NEGATIVE mg/dL Final   Protein, ur 90/98/7974 100 (A)  NEGATIVE mg/dL Final   Nitrite 90/98/7974 NEGATIVE  NEGATIVE Final   Leukocytes,Ua 06/20/2024 NEGATIVE  NEGATIVE Final   RBC / HPF 06/20/2024 0-5  0 - 5 RBC/hpf Final   WBC, UA 06/20/2024 0-5  0 - 5 WBC/hpf Final   Bacteria, UA 06/20/2024 NONE SEEN  NONE SEEN Final   Squamous Epithelial / HPF 06/20/2024 0-5  0 - 5 /HPF Final   Mucus 06/20/2024 PRESENT   Final   Performed at [facility] Lab, [address] [place], [place] [address]   Alcohol, Ethyl (B) 06/20/2024 <15  <15 mg/dL Final   Comment: (NOTE) For medical purposes only. Performed at [facility], [address], [place], [place] [address]    Ammonia 06/20/2024 <13  9 - 35 umol/L Final   Performed at [facility], [address] [place], [place] [address]   Opiates 06/20/2024 NONE DETECTED  NONE DETECTED Final   Cocaine 06/20/2024 NONE DETECTED  NONE DETECTED Final   Benzodiazepines 06/20/2024 NONE DETECTED  NONE DETECTED Final   Amphetamines 06/20/2024 NONE DETECTED  NONE DETECTED Final   Tetrahydrocannabinol 06/20/2024 NONE DETECTED  NONE DETECTED Final   Barbiturates 06/20/2024 NONE DETECTED  NONE DETECTED Final   Comment: (NOTE) DRUG SCREEN FOR MEDICAL PURPOSES ONLY.  IF CONFIRMATION IS NEEDED FOR ANY PURPOSE, NOTIFY LAB WITHIN 5 DAYS.  LOWEST DETECTABLE LIMITS FOR URINE DRUG SCREEN Drug Class                     Cutoff (ng/mL) Amphetamine and metabolites    1000 Barbiturate and metabolites    200 Benzodiazepine                 200 Opiates and metabolites        300 Cocaine and metabolites        300 THC                             50 Performed at [facility] Lab, [address], [place], [place] [address]    Sodium 06/20/2024 135  135 - 145 mmol/L Final   Potassium 06/20/2024 3.8  3.5 - 5.1 mmol/L Final   Chloride 06/20/2024 103  98 - 111 mmol/L  Final   BUN 06/20/2024 6  6 - 20 mg/dL Final   Creatinine, Ser 06/20/2024 0.80  0.61 - 1.24 mg/dL Final   Glucose, Bld 90/98/7974 90  70 - 99 mg/dL Final   Glucose reference range applies only to samples taken after fasting for at least 8 hours.   Calcium , Ion 06/20/2024 1.01 (L)  1.15 - 1.40 mmol/L Final   TCO2 06/20/2024 19 (L)  22 - 32 mmol/L Final   Hemoglobin 06/20/2024 12.9 (L)  13.0 - 17.0 g/dL Final   HCT 90/98/7974 38.0 (L)  39.0 - 52.0 % Final   Calprotectin, Fecal 06/20/2024 22  0 - 120 ug/g Final   Comment: (NOTE) Concentration     Interpretation   Follow-Up < 5 - 50 ug/g     Normal           None >50 -120 ug/g     Borderline       Re-evaluate in 4-6 weeks    >120 ug/g     Abnormal         Repeat as clinically                                   indicated Performed At: [facility] [address] [place], [place] [id number] Jennette Shorter MD Ey:[id number]    Magnesium  06/20/2024 1.7  1.7 - 2.4 mg/dL Final   Performed at [facility], [address], [place], [place] [address]   Phosphorus 06/20/2024 2.7  2.5 - 4.6 mg/dL Final   Performed at [facility] Lab, [address] [place], [place] [address]   LDH 06/20/2024 220 (H)  98 - 192 U/L Final   Performed at [facility] Lab, [address] [place], [place] [address]   WBC 06/21/2024 4.8  4.0 - 10.5 K/uL Final   RBC 06/21/2024 3.20 (L)  4.22 - 5.81 MIL/uL Final   Hemoglobin 06/21/2024 10.6 (L)  13.0 - 17.0 g/dL Final   HCT 90/97/7974 31.8 (L)  39.0 - 52.0 % Final   MCV 06/21/2024 99.4  80.0 - 100.0 fL Final   MCH 06/21/2024 33.1  26.0 - 34.0 pg Final   MCHC 06/21/2024 33.3  30.0 - 36.0 g/dL Final   RDW 90/97/7974 14.0  11.5 - 15.5 % Final   Platelets 06/21/2024 270  150 - 400  K/uL Final   nRBC 06/21/2024 0.0  0.0 - 0.2 % Final   Performed at [facility] Lab, [address], [place], [place] [address]   Sodium 06/21/2024 135  135 - 145 mmol/L Final   Potassium 06/21/2024 3.4 (L)  3.5 - 5.1 mmol/L Final   Chloride 06/21/2024 102  98 - 111 mmol/L Final   CO2 06/21/2024 19 (L)  22 - 32 mmol/L Final   Glucose, Bld 06/21/2024 160 (H)  70 - 99 mg/dL Final   Glucose reference range applies only to samples taken after fasting for at least 8 hours.   BUN 06/21/2024 11  6 - 20 mg/dL Final   Creatinine, Ser 06/21/2024 1.39 (H)  0.61 - 1.24 mg/dL Final   Calcium  06/21/2024 8.3 (L)  8.9 - 10.3 mg/dL Final   GFR, Estimated 06/21/2024 58 (L)  >60 mL/min Final   Comment: (NOTE) Calculated using the CKD-EPI Creatinine Equation (2021)    Anion gap 06/21/2024 14  5 - 15 Final   Performed at [facility] Lab,  [address] [place], [place] [address]   Iron 06/21/2024 111  45 - 182 ug/dL Final   TIBC 90/97/7974 309  250 - 450 ug/dL Final   Saturation Ratios 06/21/2024 36  17.9 - 39.5 % Final   UIBC 06/21/2024 198  ug/dL Final   Performed at [facility] Lab, [address], [place], [place] [address]   Ferritin 06/21/2024 46  24 - 336 ng/mL Final   Performed at [facility] Lab, [address] [place], [place] [address]   TSH 06/21/2024 5.705 (H)  0.350 - 4.500 uIU/mL Final   Comment: Performed by a 3rd Generation assay with a functional sensitivity of <=0.01 uIU/mL. Performed at [facility] Lab, [address], [place], [place] [address]    CRP 06/21/2024 0.5  <1.0 mg/dL Final   Performed at [facility] Lab, [address] [place], [place] [address]   Glucose-Capillary 06/20/2024 161 (H)  70 - 99 mg/dL Final   Glucose reference range applies only to samples taken after fasting for at least 8 hours.   WBC 06/22/2024 5.1  4.0 - 10.5 K/uL Final   RBC 06/22/2024 2.88 (L)  4.22 - 5.81 MIL/uL Final   Hemoglobin 06/22/2024 9.6 (L)  13.0 - 17.0 g/dL Final   HCT  90/96/7974 28.7 (L)  39.0 - 52.0 % Final   MCV 06/22/2024 99.7  80.0 - 100.0 fL Final   MCH 06/22/2024 33.3  26.0 - 34.0 pg Final   MCHC 06/22/2024 33.4  30.0 - 36.0 g/dL Final   RDW 90/96/7974 14.2  11.5 - 15.5 % Final   Platelets 06/22/2024 268  150 - 400 K/uL Final   nRBC 06/22/2024 0.0  0.0 - 0.2 % Final   Performed at [facility], [address], [place], [place] [address]   Sodium 06/22/2024 138  135 - 145 mmol/L Final   Potassium 06/22/2024 4.2  3.5 - 5.1 mmol/L Final   Chloride 06/22/2024 109  98 - 111 mmol/L Final   CO2 06/22/2024 21 (L)  22 - 32 mmol/L Final   Glucose, Bld 06/22/2024 98  70 - 99 mg/dL Final   Glucose reference range applies only to samples taken after fasting for at least 8 hours.   BUN 06/22/2024 17  6 - 20 mg/dL Final   Creatinine, Ser 06/22/2024 1.11  0.61 - 1.24 mg/dL Final   Calcium  06/22/2024 8.1 (L)  8.9 - 10.3 mg/dL Final   Phosphorus 90/96/7974 2.6  2.5 - 4.6 mg/dL Final   Albumin 90/96/7974 3.2 (L)  3.5 - 5.0 g/dL Final   GFR, Estimated 06/22/2024 >60  >60 mL/min Final   Comment: (NOTE) Calculated using the CKD-EPI Creatinine Equation (2021)    Anion gap 06/22/2024 8  5 - 15 Final   Performed at [facility], [address] [place], [place] [address]   WBC 06/23/2024 5.9  4.0 - 10.5 K/uL Final   RBC 06/23/2024 2.85 (L)  4.22 - 5.81 MIL/uL Final   Hemoglobin 06/23/2024 9.5 (L)  13.0 - 17.0 g/dL Final   HCT 90/95/7974 29.0 (L)  39.0 - 52.0 % Final   MCV 06/23/2024 101.8 (H)  80.0 - 100.0 fL Final   MCH 06/23/2024 33.3  26.0 - 34.0 pg Final   MCHC 06/23/2024 32.8  30.0 - 36.0 g/dL Final   RDW 90/95/7974 14.3  11.5 - 15.5 % Final   Platelets 06/23/2024 269  150 - 400 K/uL Final   nRBC  06/23/2024 0.0  0.0 - 0.2 % Final   Performed at [facility], [address], [place], [place] [address]   Sodium 06/23/2024 140  135 - 145 mmol/L Final   Potassium 06/23/2024 3.7  3.5 - 5.1 mmol/L Final   Chloride 06/23/2024 108  98 - 111  mmol/L Final   CO2 06/23/2024 20 (L)  22 - 32 mmol/L Final   Glucose, Bld 06/23/2024 107 (H)  70 - 99 mg/dL Final   Glucose reference range applies only to samples taken after fasting for at least 8 hours.   BUN 06/23/2024 11  6 - 20 mg/dL Final   Creatinine, Ser 06/23/2024 0.88  0.61 - 1.24 mg/dL Final   Calcium  06/23/2024 8.2 (L)  8.9 - 10.3 mg/dL Final   GFR, Estimated 06/23/2024 >60  >60 mL/min Final   Comment: (NOTE) Calculated using the CKD-EPI Creatinine Equation (2021)    Anion gap 06/23/2024 12  5 - 15 Final   Performed at [facility], [address], [place], [place] [address]   WBC 06/24/2024 6.1  4.0 - 10.5 K/uL Final   RBC 06/24/2024 3.10 (L)  4.22 - 5.81 MIL/uL Final   Hemoglobin 06/24/2024 10.2 (L)  13.0 - 17.0 g/dL Final   HCT 90/94/7974 32.2 (L)  39.0 - 52.0 % Final   MCV 06/24/2024 103.9 (H)  80.0 - 100.0 fL Final   MCH 06/24/2024 32.9  26.0 - 34.0 pg Final   MCHC 06/24/2024 31.7  30.0 - 36.0 g/dL Final   RDW 90/94/7974 14.6  11.5 - 15.5 % Final   Platelets 06/24/2024 298  150 - 400 K/uL Final   nRBC 06/24/2024 0.0  0.0 - 0.2 % Final   Performed at [facility], [address], [place], [place] [address]   Sodium 06/24/2024 140  135 - 145 mmol/L Final   Potassium 06/24/2024 3.8  3.5 - 5.1 mmol/L Final   Chloride 06/24/2024 108  98 - 111 mmol/L Final   CO2 06/24/2024 19 (L)  22 - 32 mmol/L Final   Glucose, Bld 06/24/2024 101 (H)  70 - 99 mg/dL Final   Glucose reference range applies only to samples taken after fasting for at least 8 hours.   BUN 06/24/2024 6  6 - 20 mg/dL Final   Creatinine, Ser 06/24/2024 0.92  0.61 - 1.24 mg/dL Final   Calcium  06/24/2024 8.6 (L)  8.9 - 10.3 mg/dL Final   GFR, Estimated 06/24/2024 >60  >60 mL/min Final   Comment: (NOTE) Calculated using the CKD-EPI Creatinine Equation (2021)    Anion gap 06/24/2024 13  5 - 15 Final   Performed at [facility], [address] [place], [place] [address]   SURGICAL PATHOLOGY  06/24/2024    Final-Edited                   Value:SURGICAL PATHOLOGY CASE: [id number] PATIENT: Jacob Adams Surgical Pathology Report     Clinical History: GI bleeding, R/O ulcerative colitis, dysplasia (cm)     FINAL MICROSCOPIC DIAGNOSIS:  A. COLON, RIGHT, BIOPSY: - Unremarkable colonic mucosa. - Negative for granulomas, dysplasia, and malignancy.  B. COLON POLYP; POLYPECTOMY: - Tubular adenoma. - Negative for high-grade dysplasia and malignancy.  C. COLON, LEFT, BIOPSY: - Unremarkable colonic mucosa. - Negative for granulomas, dysplasia, and malignancy.  GROSS DESCRIPTION:  A. Received in formalin labeled with the patients name and Right colon is a 1.5 x 1.3 x 0.2 cm aggregate of  tan soft tissue fragments, submitted in toto in a single cassette.  B. Received in formalin labeled with the patients name and Colon polyp is a 0.5 cm piece of tan soft tissue, submitted in toto in a single cassette.  C. Received in formalin labeled with the patients name and Left colon bx is a 1.7 x 1.5 x 0.2                          cm aggregate of tan soft tissue fragments, submitted in toto in a single cassette.  (LEF 06/25/2024)  Final Diagnosis performed by Rexene Daily, MD.   Electronically signed 06/28/2024 Technical component performed at [facility]. [facility], [address] [place], [place] [address].  Professional component performed at [facility]. [address], [place], [place] [address]  Immunohistochemistry Technical component (if applicable) was performed at [employer]. [address], [place], [place] [address].  IMMUNOHISTOCHEMISTRY DISCLAIMER (if applicable): Some of these immunohistochemical stains may have been developed and the performance characteristics determine by [facility]. Some may not have been cleared or approved by the U.S. Food and Drug Administration. The FDA  has determined that such clearance or approval is not necessary. This test is used for clinical purposes. It should not be rega                         rded as investigational or for research. This laboratory is certified under the Clinical Laboratory Improvement Amendments of 1988 (CLIA-88) as qualified to perform high complexity clinical laboratory testing.  The controls stained appropriately.     Blood Alcohol level:  Lab Results  Component Value Date   ETH 52 (H) 11/22/2024   ETH <15 06/20/2024    Metabolic Disorder Labs: Lab Results  Component Value Date   HGBA1C 6.1 (H) 11/22/2024   MPG 128.37 11/22/2024   MPG 119.76 04/26/2024   Lab Results  Component Value Date   PROLACTIN 32.0 (H) 11/05/2015   Lab Results  Component Value Date   CHOL 201 (H) 11/22/2024   TRIG 132 11/22/2024   HDL 80 11/22/2024   CHOLHDL 2.5 11/22/2024   VLDL 26 11/22/2024   LDLCALC 95 11/22/2024   LDLCALC 52 06/29/2020    Therapeutic Lab Levels: No results found for: LITHIUM No results found for: VALPROATE No results found for: CBMZ  Physical Findings   AIMS    Flowsheet Row [facility]) [facility]) [facility]osp-Admission (Discharged) from 10/30/2015 in [facility]  AIMS Total Score 0 0 0   AUDIT    Flowsheet Row Admission (Discharged) from 01/04/2024 in [facility] Office Visit from 06/29/2020 in [facility] Ad[facility] (Discharged) [facility] (Discharged) [facility]osp-Admission (Discharged) from 10/30/2015 in [facility] Emergency Department at [facility]  Alcohol Use Disorder Identification Test Final Score (AUDIT) 6 10 27   34 31   GAD-7    Flowsheet Row [facility]  Total GAD-7 Score 15   PHQ2-9    Flowsheet Row ED from 11/22/2024 in [facility] Office Visit from 06/29/2020 in [facility]  PHQ-2 Total Score 5 4  PHQ-9 Total Score 17 19   Flowsheet Row ED from 11/22/2024 in [facility] Most recent reading at 11/23/2024 12:35 AM ED from 11/22/2024 in [facility] Most recent reading at 11/22/2024  3:50 PM ED from 11/21/2024 in [facility] Most recent reading at 11/21/2024  3:36 PM  C-SSRS RISK CATEGORY No Risk No Risk No Risk     Musculoskeletal  Strength & Muscle Tone: within normal limits Gait & Station: normal   Psychiatric Specialty Exam  Presentation  General Appearance:  Appropriate for Environment; Casual  Eye Contact: Good  Speech: Clear and Coherent; Normal Rate  Speech Volume: Normal  Handedness: Right   Mood and Affect  Mood: Depressed  Affect: Appropriate; Congruent   Thought Process  Thought Processes: Coherent; Goal Directed; Linear  Descriptions of Associations:Intact  Orientation:Full (Time, Place and Person)  Thought Content:Logical; WDL  Diagnosis of Schizophrenia or Schizoaffective disorder in past: Yes  Duration of Psychotic Symptoms: Greater than six months   Hallucinations:Hallucinations: Auditory Description of Auditory Hallucinations: I hear noises, like voices that scream, all the time  Ideas of Reference:None  Suicidal Thoughts:Suicidal Thoughts: No  Homicidal Thoughts:Homicidal Thoughts: No   Sensorium  Memory: Immediate Fair; Recent Fair  Judgment: Fair  Insight: Fair   Art Therapist  Concentration: Fair  Attention Span: Fair  Recall: [employer] of Knowledge: Fair  Language: Fair   Psychomotor Activity  Psychomotor Activity: Psychomotor  Activity: Normal   Assets  Assets: Communication Skills; Desire for Improvement; Housing; Health And Safety Inspector; Resilience; Social Support   Sleep  Sleep: Sleep: Fair  Estimated Sleeping Duration (Last 24 Hours): 8.25-9.75 hours  No data recorded  Physical Exam  Physical Exam ROS Blood pressure 121/89, pulse 91, temperature 98.2 F (36.8 C), resp. rate 17, SpO2 100%. There is no height or weight on file to calculate BMI.  Treatment Plan Summary: Daily contact with patient to assess and evaluate symptoms and progress in treatment  Warren Archer, [facility] 11/24/2024 11:27 AM     Archer Warren, RN 11/24/24 1447

## 2024-11-24 NOTE — ED Notes (Addendum)
 Pt presents as blank, cooperative , providing minimal feedback. Pt reports to feel 'ok'. Pt denies si hi and avh. Pt c/o migraine, declined prn tylenol . Pt later administered scheduled dose of topamax . Medications reviewed, questions denied.

## 2024-11-24 NOTE — Group Note (Signed)
 Group Topic: Communication  Group Date: 11/24/2024 Start Time: 1330 End Time: 1410 Facilitators: Laneta Renea POUR, NT  Department: [facility]  Number of Participants: 3  Group Focus: coping skills and dual diagnosis Treatment Modality:  Psychoeducation Interventions utilized were problem solving Purpose: enhance coping skills and relapse prevention strategies  Name: Jacob Adams Date of Birth: 04/23/1966  MR: [id number]    Level of Participation: minimal Quality of Participation: cooperative and engaged Interactions with others: gave feedback Mood/Affect: bright and positive Triggers (if applicable): None Cognition: coherent/clear and insightful Progress: Gaining insight Response:  I am here for my mental health recovery.  Plan: patient will be encouraged to attend group.   Patients Problems:  Patient Active Problem List   Diagnosis Date Noted   Hematochezia 06/24/2024   Hemorrhoids 06/24/2024   Polyp of ascending colon 06/24/2024   Lower GI bleed 06/20/2024   Anemia 05/12/2024   Acute lower GI bleeding 05/11/2024   Alcohol use disorder 05/11/2024   GERD (gastroesophageal reflux disease) 05/11/2024   Pain due to total left knee replacement 05/11/2024   Status post total left knee replacement 05/05/2024   Primary osteoarthritis of left knee 05/04/2024   Syncope 03/10/2024   Diarrhea 03/10/2024   Hypotension 01/17/2024   Chronic health problem 01/15/2024   AKI (acute kidney injury) 01/15/2024   History of seizure due to alcohol withdrawal 01/15/2024   Abdominal pain 01/15/2024   Major depressive disorder, 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   Major depressive disorder, recurrent, severe without psychotic behavior (HCC) 01/02/2024   Low back pain 12/31/2023   Lactic acid acidosis 12/31/2023   Low glucose level 12/31/2023   Suicidal behavior 12/31/2023   Chronic colitis 04/25/2022   Alcoholic cirrhosis of liver  without ascites (HCC) 04/25/2022   Portal hypertensive gastropathy (HCC) 04/25/2022   History of anemia 04/25/2022   Hyponatremia 01/23/2022   High anion gap metabolic acidosis 01/23/2022   Hyperkalemia 01/23/2022   Fatty liver 01/23/2022   Insomnia 10/04/2020   AMS (altered mental status) 07/23/2020   Sleep apnea    Hypertension    Acute metabolic encephalopathy 01/24/2020   Normocytic anemia 08/15/2019   Hypothyroidism 08/15/2019   HLD (hyperlipidemia) 08/15/2019   Anxiety and depression 08/15/2019   Chronic systolic CHF (congestive heart failure) (HCC) 02/08/2019   Tobacco dependence 02/08/2019   Alcohol withdrawal (HCC) 01/2019   HTN (hypertension) 12/24/2018   Alcohol abuse 01/06/2016   MDD (major depressive disorder), recurrent, severe, 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/03/2015   Alcohol use disorder, severe, dependence (HCC) 11/03/2015   History of GI bleed 10/31/2015   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

## 2024-11-24 NOTE — ED Notes (Signed)
Patient sleeping with no s/s of distress.

## 2024-11-24 NOTE — Group Note (Signed)
 Group Topic: Wellness  Group Date: 11/24/2024 Start Time: 1200 End Time: 1230 Facilitators: Daved Tinnie HERO, RN  Department: [facility]  Number of Participants: 5  Group Focus: nursing group Treatment Modality:  Psychoeducation Interventions utilized were patient education Purpose: increase insight  Name: Jacob Adams Date of Birth: [birth year]  MR: [id number]    Level of Participation: pt not admitted to unit at time of RN group  Plan: patient will be encouraged to attend future RN education groups  Patients Problems:  Patient Active Problem List   Diagnosis Date Noted   Hematochezia 06/24/2024   Hemorrhoids 06/24/2024   Polyp of ascending colon 06/24/2024   Lower GI bleed 06/20/2024   Anemia 05/12/2024   Acute lower GI bleeding 05/11/2024   Alcohol use disorder 05/11/2024   GERD (gastroesophageal reflux disease) 05/11/2024   Pain due to total left knee replacement 05/11/2024   Status post total left knee replacement 05/05/2024   Primary osteoarthritis of left knee 05/04/2024   Syncope 03/10/2024   Diarrhea 03/10/2024   Hypotension 01/17/2024   Chronic health problem 01/15/2024   AKI (acute kidney injury) 01/15/2024   History of seizure due to alcohol withdrawal 01/15/2024   Abdominal pain 01/15/2024   Major depressive disorder, 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   Major depressive disorder, recurrent, severe without psychotic behavior (HCC) 01/02/2024   Low back pain 12/31/2023   Lactic acid acidosis 12/31/2023   Low glucose level 12/31/2023   Suicidal behavior 12/31/2023   Chronic colitis 04/25/2022   Alcoholic cirrhosis of liver without ascites (HCC) 04/25/2022   Portal hypertensive gastropathy (HCC) 04/25/2022   History of anemia 04/25/2022   Hyponatremia 01/23/2022   High anion gap metabolic acidosis 01/23/2022   Hyperkalemia 01/23/2022   Fatty liver 01/23/2022   Insomnia 10/04/2020   AMS (altered mental  status) 07/23/2020   Sleep apnea    Hypertension    Acute metabolic encephalopathy 01/24/2020   Normocytic anemia 08/15/2019   Hypothyroidism 08/15/2019   HLD (hyperlipidemia) 08/15/2019   Anxiety and depression 08/15/2019   Chronic systolic CHF (congestive heart failure) (HCC) 02/08/2019   Tobacco dependence 02/08/2019   Alcohol withdrawal (HCC) 01/2019   HTN (hypertension) 12/24/2018   Alcohol abuse 01/06/2016   MDD (major depressive disorder), recurrent, severe, 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/03/2015   Alcohol use disorder, severe, dependence (HCC) 11/03/2015   History of GI bleed 10/31/2015   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

## 2024-11-24 NOTE — ED Notes (Addendum)
 Medications reviewed with pt, RN informed pt that he has trazodone  available to assist with sleep if requested.  Fixodent was provided as requested.

## 2024-11-24 NOTE — ED Notes (Signed)
 Pt continues to present as generally guarded calm and cooperative. Pt ate lunch, no concerns or complaints reported to RN. No signs of distrress observed.

## 2024-11-24 NOTE — Care Management (Signed)
 [facility] Care Management...   Writer met with patient to discuss discharge planning.  [facility]   Patient reported he does not need any resource information  Patient scheduled to discharge  Saturday 11/26/24 to home by 11:00 am  [address]  [place] [address]   Patient will arrange transportation  Scripts

## 2024-11-25 MED ORDER — AQUAPHOR EX OINT: TOPICAL_OINTMENT | CUTANEOUS | Status: DC | PRN

## 2024-11-25 MED ORDER — GABAPENTIN 100 MG PO CAPS: 300.0000 mg | ORAL_CAPSULE | Freq: Three times a day (TID) | ORAL | Status: AC | PRN

## 2024-11-25 MED ORDER — HYDROXYZINE HCL 25 MG PO TABS: 25.0000 mg | ORAL_TABLET | Freq: Two times a day (BID) | ORAL | 0 refills | Status: AC | PRN

## 2024-11-25 NOTE — Care Management (Addendum)
 [facility] Care Management...  Patient requested discharge today Friday 11/25/2024  Patient declined our assistance with residential treatment and outpatient resources  Patient reported he will reconnect with the [facility] in [place] for inpatient outpatient services.  Client is 100% [facility].   Patient will discharge to home    [address]  [place]  [address]   Patient will arrange transportation  Patient will follow up with [facility]  for Primary Care and Counseling...   [facility] [address]  [place], [place] [address] [phone]  Scripts

## 2024-11-25 NOTE — ED Provider Notes (Cosign Needed)
 FBC/OBS ASAP Discharge Summary  Date and Time: 11/25/2024 4:26 PM  Name: Jacob Adams  MRN:  [id number]   Discharge Diagnoses:  Final diagnoses:  Alcohol use disorder, severe, dependence (HCC)  MDD (major depressive disorder), recurrent, severe, [name]   HPI: Jacob Adams is a 59 year old male who presented voluntarily to [facility] behavioral health on 11/22/2024 and admitted to [facility].  Patient history includes MDD, schizophrenia, alcohol use disorder.  Patient presented with request for detox from alcohol.  Patient endorsed drinking up to 15 beers and 1/5 of liquor daily for 1 week.  Patient completed [facility]-day [facility] alcohol use treatment program in October 2025.  17-month sobriety prior to relapse 1 week ago.  He also endorsed auditory and visual hallucinations of demons upon initial presentation.   Subjective: Upon assessment today, pt is observed in dayroom watching TV, in no acute distress. He is alert and oriented.  He reports feeling better and is ready to discharge home today. He continues to deny having any withdrawal symptoms or cravings at this time. He has been engaged in group programming and compliant with medications.  He reports sleeping well and having a decent appetite. He continues to deny suicidal ideations and homicidal ideations.  He denies visual hallucinations but does endorse ongoing chronic auditory hallucinations of voices talking .  Patient denies command auditory hallucinations and no longer feels threatened or paranoid about them.  Patient states that the auditory hallucinations never fully resolve but are manageable with his medications.  Patient currently living with his wife and helps take care of multiple grandkids.  He is looking forward to being able to get a job after completing the [facility]. He denies having any safety concerns and feels ready to discharge. SW spoke with wife who reports that pt is supposed to be  getting baptized on Sunday and that she does not have any concerns with pt returning home.   Stay Summary: Pt was admitted to [facility] from [facility] on 11/22/24 for alcohol abuse treatment and hallucinations. BAL upon admission was 52. UDS positive for Oxycodone  as previously prescribed.  During his stay patient was restarted on his medications including psychotropic medications Cymbalta  60 mg, Olanzapine  15 mg, he was started on trazodone  50 mg as needed and hydroxyzine  25 mg as needed.  Patient states that the auditory hallucinations never completely resolved but that they have improved with getting back on medication.  Patient was compliant with medications without concern.  He did not display any signs of withdrawal symptoms and did not require any PRN withdrawal medication during his stay.  No acute concerns.  Total Time spent with patient: 30 minutes  Past Psychiatric History: Alcohol use disorder, schizophrenia, MDD  Past Medical History:  Past Medical History:  Diagnosis Date   Acid reflux    Alcohol withdrawal (HCC) 01/2019   Anxiety    Arthritis    toes (07/26/2014)   CHF (congestive heart failure) (HCC)    Depression    Diabetes mellitus without complication (HCC)    Headache(784.0)    weekly (07/26/2014)   History of blood transfusion ~ 2000   related to nose bleeding   History of stomach ulcers    Hypertension    Lower GI bleeding admitted 07/26/2014   Mental disorder    Migraine    @ least monthly (07/26/2014)   Pancreatitis    Rectal bleeding 07/26/2014   Sleep apnea    haven't been RX'd mask yet (07/26/2014)  Family History:  Family History  Problem Relation Age of Onset   Colon cancer Mother    CAD Mother    Alcoholism Father    Alcoholism Brother    Esophageal cancer Neg Hx    Inflammatory bowel disease Neg Hx    Liver disease Neg Hx    Pancreatic cancer Neg Hx    Stomach cancer Neg Hx    Rectal cancer Neg Hx     Social History: Patient resides in  [place] with his wife, son, son's girlfriend, and grandson. He receives disability income. [facility] case manager has located an employment opportunity for patient, scheduled to start soon. Denies access to weapons.    Tobacco Cessation:  N/A, patient does not currently use tobacco products  Current Medications:  Current Facility-Administered Medications  Medication Dose Route Frequency Provider Last Rate Last Admin   acetaminophen  (TYLENOL ) tablet 650 mg  650 mg Oral Q6H PRN Trudy Carwin, NP       albuterol  (VENTOLIN  HFA) 108 (90 Base) MCG/ACT inhaler 2 puff  2 puff Inhalation Q6H PRN Trudy Carwin, NP       alum & mag hydroxide-simeth (MAALOX/MYLANTA) 200-200-20 MG/5ML suspension 30 mL  30 mL Oral Q4H PRN Trudy Carwin, NP       atorvastatin  (LIPITOR) tablet 80 mg  80 mg Oral QHS Williams, Roy, NP   80 mg at 11/24/24 2117   calcium -vitamin D  (OSCAL WITH D) 500-5 MG-MCG per tablet 1 tablet  1 tablet Oral Q lunch Tex Drilling, NP   1 tablet at 11/25/24 1134   haloperidol  (HALDOL ) tablet 5 mg  5 mg Oral TID PRN Trudy Carwin, NP       And   diphenhydrAMINE  (BENADRYL ) capsule 50 mg  50 mg Oral TID PRN Trudy Carwin, NP       haloperidol  lactate (HALDOL ) injection 5 mg  5 mg Intramuscular TID PRN Trudy Carwin, NP       And   diphenhydrAMINE  (BENADRYL ) injection 50 mg  50 mg Intramuscular TID PRN Trudy Carwin, NP       And   LORazepam  (ATIVAN ) injection 2 mg  2 mg Intramuscular TID PRN Trudy Carwin, NP       haloperidol  lactate (HALDOL ) injection 10 mg  10 mg Intramuscular TID PRN Trudy Carwin, NP       And   diphenhydrAMINE  (BENADRYL ) injection 50 mg  50 mg Intramuscular TID PRN Trudy Carwin, NP       And   LORazepam  (ATIVAN ) injection 2 mg  2 mg Intramuscular TID PRN Trudy Carwin, NP       docusate sodium  (COLACE) capsule 100 mg  100 mg Oral Daily PRN Trudy Carwin, NP       DULoxetine  (CYMBALTA ) DR capsule 60 mg  60 mg Oral BID Nkwenti, Doris, NP   60 mg at 11/25/24 9170   famotidine   (PEPCID ) tablet 20 mg  20 mg Oral BID Tex Drilling, NP   20 mg at 11/25/24 9170   folic acid  (FOLVITE ) tablet 1 mg  1 mg Oral Daily Trudy Carwin, NP   1 mg at 11/25/24 9170   gabapentin  (NEURONTIN ) capsule 300 mg  300 mg Oral TID PRN Trudy Carwin, NP       hydrOXYzine  (ATARAX ) tablet 25 mg  25 mg Oral Q6H PRN Trudy Carwin, NP   25 mg at 11/22/24 2240   latanoprost  (XALATAN ) 0.005 % ophthalmic solution 1 drop  1 drop Both Eyes Daily Trudy Carwin, NP   1 drop  at 11/25/24 0831   levothyroxine  (SYNTHROID ) tablet 88 mcg  88 mcg Oral Q0600 Trudy Carwin, NP   88 mcg at 11/25/24 9452   loperamide  (IMODIUM ) capsule 2-4 mg  2-4 mg Oral PRN Trudy Carwin, NP       LORazepam  (ATIVAN ) tablet 1 mg  1 mg Oral Q6H PRN Trudy Carwin, NP       magnesium  hydroxide (MILK OF MAGNESIA) suspension 30 mL  30 mL Oral Daily PRN Trudy Carwin, NP       mesalamine  (LIALDA ) EC tablet 4.8 g  4.8 g Oral Q breakfast Tex Drilling, NP   4.8 g at 11/25/24 0830   metFORMIN  (GLUCOPHAGE -XR) 24 hr tablet 500 mg  500 mg Oral Q breakfast Tex Drilling, NP   500 mg at 11/25/24 9171   metoprolol  succinate (TOPROL -XL) 24 hr tablet 100 mg  100 mg Oral Daily Nkwenti, Doris, NP   100 mg at 11/25/24 9171   mineral oil-hydrophilic petrolatum (AQUAPHOR) ointment   Topical PRN Onuoha, Chinwendu V, NP       multivitamin with minerals tablet 1 tablet  1 tablet Oral Daily Trudy Carwin, NP   1 tablet at 11/25/24 9170   nicotine  (NICODERM CQ  - dosed in mg/24 hours) patch 21 mg  21 mg Transdermal Daily Nkwenti, Doris, NP   21 mg at 11/25/24 9167   OLANZapine  (ZYPREXA ) tablet 15 mg  15 mg Oral QHS Tex Drilling, NP   15 mg at 11/24/24 2118   ondansetron  (ZOFRAN -ODT) disintegrating tablet 4 mg  4 mg Oral Q6H PRN Trudy Carwin, NP       pantoprazole  (PROTONIX ) EC tablet 40 mg  40 mg Oral BID Nkwenti, Doris, NP   40 mg at 11/25/24 9170   sacubitril -valsartan  (ENTRESTO ) 49-51 mg per tablet  1 tablet Oral BID Tex Drilling, NP   1 tablet at  11/25/24 9170   thiamine  (VITAMIN B1) tablet 100 mg  100 mg Oral Daily Trudy Carwin, NP   100 mg at 11/25/24 9170   topiramate  (TOPAMAX ) tablet 50 mg  50 mg Oral BID Tex Drilling, NP   50 mg at 11/25/24 9170   traZODone  (DESYREL ) tablet 50 mg  50 mg Oral QHS PRN Trudy Carwin, NP   50 mg at 11/24/24 2119   Current Outpatient Medications  Medication Sig Dispense Refill   albuterol  (VENTOLIN  HFA) 108 (90 Base) MCG/ACT inhaler Inhale 2 puffs into the lungs every 6 (six) hours as needed for wheezing or shortness of breath.     alprostadil  (EDEX ) 40 MCG injection 40 mcg by Intracavitary route See admin instructions. Inject every 96 hours (every 4 days) as needed into penis. Seek medical attention for erection lasting more than 4 hours     ammonium lactate  (LAC-HYDRIN ) 12 % lotion Apply 1 Application topically daily.     atorvastatin  (LIPITOR) 80 MG tablet Take 1 tablet (80 mg total) by mouth at bedtime. (Patient taking differently: Take 80 mg by mouth in the morning.) 30 tablet 0   Calcium  Carb-Cholecalciferol  (CALTRATE 600+D3 PO) Take 1 tablet by mouth in the morning.     docusate sodium  (COLACE) 100 MG capsule Take 1 capsule (100 mg total) by mouth daily as needed. (Patient taking differently: Take 100 mg by mouth daily.) 30 capsule 2   DULoxetine  (CYMBALTA ) 60 MG capsule Take 60 mg by mouth 2 (two) times daily.     famotidine  (PEPCID ) 20 MG tablet Take 20 mg by mouth 2 (two) times daily.     hydrocortisone  (  ANUSOL -HC) 2.5 % rectal cream Place 1 Application rectally daily as needed for hemorrhoids or anal itching.     hydrOXYzine  (ATARAX ) 25 MG tablet Take 1 tablet (25 mg total) by mouth 2 (two) times daily as needed for anxiety. 60 tablet 0   latanoprost  (XALATAN ) 0.005 % ophthalmic solution Place 1 drop into both eyes at bedtime.     levothyroxine  (SYNTHROID ) 88 MCG tablet Take 88 mcg by mouth daily before breakfast.     mesalamine  (LIALDA ) 1.2 g EC tablet Take 4 tablets (4.8 g total) by mouth  daily with breakfast. 120 tablet 1   metFORMIN  (GLUCOPHAGE -XR) 500 MG 24 hr tablet Take 500 mg by mouth every morning.     metoprolol  succinate (TOPROL -XL) 100 MG 24 hr tablet Take 100 mg by mouth daily. Take with or immediately following a meal. Hold for SBP less than 100 and/or heart rate less than 60     Multiple Vitamin (MULTIVITAMIN WITH MINERALS) TABS tablet Take 1 tablet by mouth daily. 30 tablet 0   naproxen  (NAPROSYN ) 375 MG tablet Take 375 mg by mouth 2 (two) times daily as needed (For pain).     nicotine  (NICODERM CQ  - DOSED IN MG/24 HOURS) 21 mg/24hr patch Place 21 mg onto the skin daily.     nicotine  polacrilex (COMMIT) 2 MG lozenge Take 2 mg by mouth every 2 (two) hours as needed for smoking cessation.     OLANZapine  (ZYPREXA ) 15 MG tablet Take 15 mg by mouth at bedtime.     oxyCODONE  (OXY IR/ROXICODONE ) 5 MG immediate release tablet Take 10 mg by mouth 3 (three) times daily as needed (For pain).     pantoprazole  (PROTONIX ) 40 MG tablet Take 1 tablet (40 mg total) by mouth 2 (two) times daily. 60 tablet 0   Phenylephrine  HCl 0.25 % SUPP Place 1 suppository rectally daily as needed (For hemorrhoids).     sacubitril -valsartan  (ENTRESTO ) 49-51 MG Take 1 tablet by mouth 2 (two) times daily. 60 tablet 0   topiramate  (TOPAMAX ) 50 MG tablet Take 50 mg by mouth 2 (two) times daily.     Ubrogepant  (UBRELVY ) 50 MG TABS Take 50 mg by mouth daily as needed (for migraines).     gabapentin  (NEURONTIN ) 100 MG capsule Take 3 capsules (300 mg total) by mouth 3 (three) times daily as needed (For burning/shooting pain).      PTA Medications:  PTA Medications  Medication Sig   atorvastatin  (LIPITOR) 80 MG tablet Take 1 tablet (80 mg total) by mouth at bedtime. (Patient taking differently: Take 80 mg by mouth in the morning.)   sacubitril -valsartan  (ENTRESTO ) 49-51 MG Take 1 tablet by mouth 2 (two) times daily.   Ubrogepant  (UBRELVY ) 50 MG TABS Take 50 mg by mouth daily as needed (for migraines).    albuterol  (VENTOLIN  HFA) 108 (90 Base) MCG/ACT inhaler Inhale 2 puffs into the lungs every 6 (six) hours as needed for wheezing or shortness of breath.   Multiple Vitamin (MULTIVITAMIN WITH MINERALS) TABS tablet Take 1 tablet by mouth daily.   Calcium  Carb-Cholecalciferol  (CALTRATE 600+D3 PO) Take 1 tablet by mouth in the morning.   latanoprost  (XALATAN ) 0.005 % ophthalmic solution Place 1 drop into both eyes at bedtime.   topiramate  (TOPAMAX ) 50 MG tablet Take 50 mg by mouth 2 (two) times daily.   pantoprazole  (PROTONIX ) 40 MG tablet Take 1 tablet (40 mg total) by mouth 2 (two) times daily.   docusate sodium  (COLACE) 100 MG capsule Take 1 capsule (100 mg  total) by mouth daily as needed. (Patient taking differently: Take 100 mg by mouth daily.)   mesalamine  (LIALDA ) 1.2 g EC tablet Take 4 tablets (4.8 g total) by mouth daily with breakfast.   oxyCODONE  (OXY IR/ROXICODONE ) 5 MG immediate release tablet Take 10 mg by mouth 3 (three) times daily as needed (For pain).   nicotine  (NICODERM CQ  - DOSED IN MG/24 HOURS) 21 mg/24hr patch Place 21 mg onto the skin daily.   nicotine  polacrilex (COMMIT) 2 MG lozenge Take 2 mg by mouth every 2 (two) hours as needed for smoking cessation.   OLANZapine  (ZYPREXA ) 15 MG tablet Take 15 mg by mouth at bedtime.   famotidine  (PEPCID ) 20 MG tablet Take 20 mg by mouth 2 (two) times daily.   ammonium lactate  (LAC-HYDRIN ) 12 % lotion Apply 1 Application topically daily.   hydrocortisone  (ANUSOL -HC) 2.5 % rectal cream Place 1 Application rectally daily as needed for hemorrhoids or anal itching.   Phenylephrine  HCl 0.25 % SUPP Place 1 suppository rectally daily as needed (For hemorrhoids).   metFORMIN  (GLUCOPHAGE -XR) 500 MG 24 hr tablet Take 500 mg by mouth every morning.   DULoxetine  (CYMBALTA ) 60 MG capsule Take 60 mg by mouth 2 (two) times daily.   metoprolol  succinate (TOPROL -XL) 100 MG 24 hr tablet Take 100 mg by mouth daily. Take with or immediately following a meal.  Hold for SBP less than 100 and/or heart rate less than 60   alprostadil  (EDEX ) 40 MCG injection 40 mcg by Intracavitary route See admin instructions. Inject every 96 hours (every 4 days) as needed into penis. Seek medical attention for erection lasting more than 4 hours   naproxen  (NAPROSYN ) 375 MG tablet Take 375 mg by mouth 2 (two) times daily as needed (For pain).   levothyroxine  (SYNTHROID ) 88 MCG tablet Take 88 mcg by mouth daily before breakfast.   hydrOXYzine  (ATARAX ) 25 MG tablet Take 1 tablet (25 mg total) by mouth 2 (two) times daily as needed for anxiety.   gabapentin  (NEURONTIN ) 100 MG capsule Take 3 capsules (300 mg total) by mouth 3 (three) times daily as needed (For burning/shooting pain).   Facility Ordered Medications  Medication   acetaminophen  (TYLENOL ) tablet 650 mg   alum & mag hydroxide-simeth (MAALOX/MYLANTA) 200-200-20 MG/5ML suspension 30 mL   magnesium  hydroxide (MILK OF MAGNESIA) suspension 30 mL   haloperidol  (HALDOL ) tablet 5 mg   And   diphenhydrAMINE  (BENADRYL ) capsule 50 mg   haloperidol  lactate (HALDOL ) injection 5 mg   And   diphenhydrAMINE  (BENADRYL ) injection 50 mg   And   LORazepam  (ATIVAN ) injection 2 mg   haloperidol  lactate (HALDOL ) injection 10 mg   And   diphenhydrAMINE  (BENADRYL ) injection 50 mg   And   LORazepam  (ATIVAN ) injection 2 mg   [COMPLETED] thiamine  (VITAMIN B1) injection 100 mg   thiamine  (VITAMIN B1) tablet 100 mg   multivitamin with minerals tablet 1 tablet   LORazepam  (ATIVAN ) tablet 1 mg   hydrOXYzine  (ATARAX ) tablet 25 mg   loperamide  (IMODIUM ) capsule 2-4 mg   ondansetron  (ZOFRAN -ODT) disintegrating tablet 4 mg   traZODone  (DESYREL ) tablet 50 mg   albuterol  (VENTOLIN  HFA) 108 (90 Base) MCG/ACT inhaler 2 puff   atorvastatin  (LIPITOR) tablet 80 mg   folic acid  (FOLVITE ) tablet 1 mg   docusate sodium  (COLACE) capsule 100 mg   gabapentin  (NEURONTIN ) capsule 300 mg   latanoprost  (XALATAN ) 0.005 % ophthalmic solution 1 drop    levothyroxine  (SYNTHROID ) tablet 88 mcg   calcium -vitamin D  (OSCAL WITH  D) 500-5 MG-MCG per tablet 1 tablet   DULoxetine  (CYMBALTA ) DR capsule 60 mg   famotidine  (PEPCID ) tablet 20 mg   mesalamine  (LIALDA ) EC tablet 4.8 g   metFORMIN  (GLUCOPHAGE -XR) 24 hr tablet 500 mg   metoprolol  succinate (TOPROL -XL) 24 hr tablet 100 mg   nicotine  (NICODERM CQ  - dosed in mg/24 hours) patch 21 mg   pantoprazole  (PROTONIX ) EC tablet 40 mg   sacubitril -valsartan  (ENTRESTO ) 49-51 mg per tablet   topiramate  (TOPAMAX ) tablet 50 mg   OLANZapine  (ZYPREXA ) tablet 15 mg   mineral oil-hydrophilic petrolatum (AQUAPHOR) ointment       11/25/2024   12:06 PM 11/23/2024   11:48 AM 06/29/2020   12:10 PM  Depression screen PHQ 2/9  Decreased Interest 1 3 2   Down, Depressed, Hopeless 1 2 2   PHQ - 2 Score 2 5 4   Altered sleeping 0 2 3  Tired, decreased energy 1 2 2   Change in appetite 0 2 3  Feeling bad or failure about yourself  1 3 3   Trouble concentrating 0 3 3  Moving slowly or fidgety/restless 0 0 0  Suicidal thoughts 0 0 1  PHQ-9 Score 4 17 19    Difficult doing work/chores Somewhat difficult Somewhat difficult Somewhat difficult     Data saved with a previous flowsheet row definition    Flowsheet Row ED from 11/22/2024 in [facility] Most recent reading at 11/23/2024 12:35 AM ED from 11/22/2024 in [facility] Most recent reading at 11/22/2024  3:50 PM ED from 11/21/2024 in [facility] Most recent reading at 11/21/2024  3:36 PM  C-SSRS RISK CATEGORY No Risk No Risk No Risk    Musculoskeletal  Strength & Muscle Tone: within normal limits Gait & Station: normal Patient leans: N/A  Psychiatric Specialty Exam  Presentation  General Appearance:  Appropriate for Environment  Eye Contact: Good  Speech: Clear and Coherent  Speech Volume: Normal  Handedness: Right   Mood and Affect   Mood: Euthymic  Affect: Flat   Thought Process  Thought Processes: Coherent  Descriptions of Associations:Intact  Orientation:Full (Time, Place and Person)  Thought Content:Logical  Diagnosis of Schizophrenia or Schizoaffective disorder in past: Yes  Duration of Psychotic Symptoms: Greater than six months   Hallucinations:Hallucinations: Auditory  Ideas of Reference:None  Suicidal Thoughts:Suicidal Thoughts: No  Homicidal Thoughts:Homicidal Thoughts: No   Sensorium  Memory: Recent Good  Judgment: Fair  Insight: Fair   Art Therapist  Concentration: Good  Attention Span: Good  Recall: Good  Fund of Knowledge: Good  Language: Good   Psychomotor Activity  Psychomotor Activity: Psychomotor Activity: Normal   Assets  Assets: Desire for Improvement   Sleep  Sleep: Sleep: Fair  Estimated Sleeping Duration (Last 24 Hours): 8.25-8.75 hours  Nutritional Assessment (For OBS and FBC admissions only) Has the patient had a weight loss or gain of 10 pounds or more in the last 3 months?: No Has the patient had a decrease in food intake/or appetite?: No Does the patient have dental problems?: No Does the patient have eating habits or behaviors that may be indicators of an eating disorder including binging or inducing vomiting?: No Has the patient recently lost weight without trying?: 0 Has the patient been eating poorly because of a decreased appetite?: 0 Malnutrition Screening Tool Score: 0    Physical Exam  Physical Exam Vitals and nursing note reviewed.  Constitutional:      Appearance: Normal appearance.  HENT:  Head: Normocephalic.  Cardiovascular:     Rate and Rhythm: Normal rate.  Pulmonary:     Effort: Pulmonary effort is normal.  Musculoskeletal:        General: Normal range of motion.     Cervical back: Normal range of motion.  Neurological:     General: No focal deficit present.     Mental Status: He is alert and  oriented to person, place, and time.  Psychiatric:        Mood and Affect: Mood normal.        Behavior: Behavior normal.    Review of Systems  Constitutional: Negative.   HENT: Negative.    Eyes: Negative.   Respiratory: Negative.    Cardiovascular: Negative.   Gastrointestinal: Negative.   Genitourinary: Negative.   Musculoskeletal: Negative.   Neurological: Negative.   Endo/Heme/Allergies: Negative.   Psychiatric/Behavioral:  Positive for hallucinations and substance abuse.    Blood pressure 125/88, pulse 84, temperature 97.6 F (36.4 C), temperature source Oral, resp. rate 18, SpO2 100%. There is no height or weight on file to calculate BMI.  Demographic Factors:  Male and Low socioeconomic status  Loss Factors: Decrease in vocational status and Financial problems/change in socioeconomic status  Historical Factors: NA  Risk Reduction Factors:   Responsible for children under 51 years of age, Sense of responsibility to family, Religious beliefs about death, Living with another person, especially a relative, Positive social support, and Positive therapeutic relationship  Continued Clinical Symptoms:  Alcohol/Substance Abuse/Dependencies  Cognitive Features That Contribute To Risk:  None    Suicide Risk:  Minimal: No identifiable suicidal ideation.  Patients presenting with no risk factors but with morbid ruminations; may be classified as minimal risk based on the severity of the depressive symptoms  Plan Of Care/Follow-up recommendations:   [facility] treatment for alcohol use disorder and depressed mood and hallucinations. The patient is able to verbalize their individual safety plan to this provider and denies any current safety concerns at time of discharge.   Prescriptions provided or sent directly to preferred pharmacy at discharge. Pt will continue medications including: Allergies as of 11/25/2024       Reactions   Nsaids  Swelling, Rash, Other (See Comments)   Stomach pain    Penicillins Itching, Swelling, Rash, Other (See Comments)   Tolerated cefazolin  05/05/24   Porcine (pork) Protein-containing Drug Products Other (See Comments)   Does not eat pork or pork by-products, personal preference   Sunscreens Swelling, Other (See Comments)   Skin peels        Medication List     TAKE these medications    albuterol  108 (90 Base) MCG/ACT inhaler Commonly known as: VENTOLIN  HFA Inhale 2 puffs into the lungs every 6 (six) hours as needed for wheezing or shortness of breath.   alprostadil  40 MCG injection Commonly known as: EDEX  40 mcg by Intracavitary route See admin instructions. Inject every 96 hours (every 4 days) as needed into penis. Seek medical attention for erection lasting more than 4 hours   ammonium lactate  12 % lotion Commonly known as: LAC-HYDRIN  Apply 1 Application topically daily.   atorvastatin  80 MG tablet Commonly known as: LIPITOR Take 1 tablet (80 mg total) by mouth at bedtime. What changed: when to take this   CALTRATE 600+D3 PO Take 1 tablet by mouth in the morning.   [employer]/Antioxidants Tabs Take 1 tablet by mouth daily.   docusate sodium  100 MG capsule Commonly  known as: Colace Take 1 capsule (100 mg total) by mouth daily as needed. What changed: when to take this   DULoxetine  60 MG capsule Commonly known as: CYMBALTA  Take 60 mg by mouth 2 (two) times daily.   Entresto  49-51 MG Generic drug: sacubitril -valsartan  Take 1 tablet by mouth 2 (two) times daily.   famotidine  20 MG tablet Commonly known as: PEPCID  Take 20 mg by mouth 2 (two) times daily.   gabapentin  100 MG capsule Commonly known as: NEURONTIN  Take 3 capsules (300 mg total) by mouth 3 (three) times daily as needed (For burning/shooting pain).   hydrocortisone  2.5 % rectal cream Commonly known as: ANUSOL -HC Place 1 Application rectally daily as needed for hemorrhoids or anal itching.    hydrOXYzine  25 MG tablet Commonly known as: ATARAX  Take 1 tablet (25 mg total) by mouth 2 (two) times daily as needed for anxiety.   latanoprost  0.005 % ophthalmic solution Commonly known as: XALATAN  Place 1 drop into both eyes at bedtime.   levothyroxine  88 MCG tablet Commonly known as: SYNTHROID  Take 88 mcg by mouth daily before breakfast.   mesalamine  1.2 g EC tablet Commonly known as: LIALDA  Take 4 tablets (4.8 g total) by mouth daily with breakfast.   metFORMIN  500 MG 24 hr tablet Commonly known as: GLUCOPHAGE -XR Take 500 mg by mouth every morning.   metoprolol  succinate 100 MG 24 hr tablet Commonly known as: TOPROL -XL Take 100 mg by mouth daily. Take with or immediately following a meal. Hold for SBP less than 100 and/or heart rate less than 60   naproxen  375 MG tablet Commonly known as: NAPROSYN  Take 375 mg by mouth 2 (two) times daily as needed (For pain).   nicotine  21 mg/24hr patch Commonly known as: NICODERM CQ  - dosed in mg/24 hours Place 21 mg onto the skin daily.   nicotine  polacrilex 2 MG lozenge Commonly known as: COMMIT Take 2 mg by mouth every 2 (two) hours as needed for smoking cessation.   OLANZapine  15 MG tablet Commonly known as: ZYPREXA  Take 15 mg by mouth at bedtime.   oxyCODONE  5 MG immediate release tablet Commonly known as: Oxy IR/ROXICODONE  Take 10 mg by mouth 3 (three) times daily as needed (For pain).   pantoprazole  40 MG tablet Commonly known as: Protonix  Take 1 tablet (40 mg total) by mouth 2 (two) times daily.   Phenylephrine  HCl 0.25 % Supp Place 1 suppository rectally daily as needed (For hemorrhoids).   topiramate  50 MG tablet Commonly known as: TOPAMAX  Take 50 mg by mouth 2 (two) times daily.   Ubrelvy  50 MG Tabs Generic drug: Ubrogepant  Take 50 mg by mouth daily as needed (for migraines).        Pt will follow up with the [facility] to attend their 30 day rehab program and transition to their [facility]  program, as he has completed their program before. He has an appointment scheduled on 12/08/2024 with the [facility].  Patient also has an appointment with [facility] in April 2026 for counseling.   It was discussed with the patient, the impact of alcohol, drugs, tobacco have been there overall psychiatric and medical wellbeing, and total abstinence from substance use was recommended the patient. Patient agreeable to plan. Given opportunity to ask questions.     In the event of worsening symptoms, the patient is instructed to call the crisis hotline, 911 and or go to the nearest ED for appropriate evaluation and treatment of symptoms.  Pt will  follow-up with primary care provider with [facility]  on 01/30/25 for other medical issues, concerns and or health care needs.   Disposition: Pt discharged home with wife.   Alan JAYSON Mcardle, NP 11/25/2024, 4:26 PM

## 2024-11-25 NOTE — ED Notes (Signed)
 Patient is currently resting with eyes closed. No acute distress noted. Respirations present, even and unlabored. No issues present will continue with safety checks every 15 minutes per facility protocol.

## 2024-11-25 NOTE — Group Note (Signed)
 Group Topic: Understanding Self  Group Date: 11/24/2024 Start Time: 1000 End Time: 1050 Facilitators: Gerome Jolly, NT  Department: [facility]  Number of Participants: 6  Group Focus: acceptance, chemical dependency education, feeling awareness/expression, forgiveness, healthy friendships, personal responsibility, relapse prevention, self-awareness, and substance abuse education Treatment Modality:  Cognitive Behavioral Therapy, Psychoeducation, and Solution-Focused Therapy Interventions utilized were clarification, exploration, problem solving, story telling, and support Purpose: Facilitator engaged the group in a open discussion about Life's Time Line. This was done to possibly identify and/or find answers to the question What Happened?  By exploring maladaptive thinking, expressing feelings, regaining self-worth, and developing relapse prevention strategies  Name: Jacob Adams Date of Birth: 01/22/1966  MR: [id number]    Level of Participation: minimal Quality of Participation: attentive, cooperative, and engaged Interactions with others: gave feedback Mood/Affect: blunted and closed / guarded Triggers (if applicable): na Cognition: blaming and coherent/clear Progress: Minimal Response: Patient reported having a good time when people got on him about hi drinking. Patient reported not being sure if he wants completely stop drinking identifying that he will consider it.  Plan: follow-up needed [facility]  Patients Problems:  Patient Active Problem List   Diagnosis Date Noted   Hematochezia 06/24/2024   Hemorrhoids 06/24/2024   Polyp of ascending colon 06/24/2024   Lower GI bleed 06/20/2024   Anemia 05/12/2024   Acute lower GI bleeding 05/11/2024   Alcohol use disorder 05/11/2024   GERD (gastroesophageal reflux disease) 05/11/2024   Pain due to total left knee replacement 05/11/2024    Status post total left knee replacement 05/05/2024   Primary osteoarthritis of left knee 05/04/2024   Syncope 03/10/2024   Diarrhea 03/10/2024   Hypotension 01/17/2024   Chronic health problem 01/15/2024   AKI (acute kidney injury) 01/15/2024   History of seizure due to alcohol withdrawal 01/15/2024   Abdominal pain 01/15/2024   Major depressive disorder, 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   Major depressive disorder, recurrent, severe without psychotic behavior (HCC) 01/02/2024   Low back pain 12/31/2023   Lactic acid acidosis 12/31/2023   Low glucose level 12/31/2023   Suicidal behavior 12/31/2023   Chronic colitis 04/25/2022   Alcoholic cirrhosis of liver without ascites (HCC) 04/25/2022   Portal hypertensive gastropathy (HCC) 04/25/2022   History of anemia 04/25/2022   Hyponatremia 01/23/2022   High anion gap metabolic acidosis 01/23/2022   Hyperkalemia 01/23/2022   Fatty liver 01/23/2022   Insomnia 10/04/2020   AMS (altered mental status) 07/23/2020   Sleep apnea    Hypertension    Acute metabolic encephalopathy 01/24/2020   Normocytic anemia 08/15/2019   Hypothyroidism 08/15/2019   HLD (hyperlipidemia) 08/15/2019   Anxiety and depression 08/15/2019   Chronic systolic CHF (congestive heart failure) (HCC) 02/08/2019   Tobacco dependence 02/08/2019   Alcohol withdrawal (HCC) 01/2019   HTN (hypertension) 12/24/2018   Alcohol abuse 01/06/2016   MDD (major depressive disorder), recurrent, severe, 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/03/2015   Alcohol use disorder, severe, dependence (HCC) 11/03/2015   History of GI bleed 10/31/2015   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

## 2024-11-25 NOTE — Group Note (Signed)
 Group Topic: Balance in Life  Group Date: 11/25/2024 Start Time: 1230 End Time: 1250 Facilitators: Deidre Prentis CROME, NT  Department: [facility]  Number of Participants: 4  Group Focus: affirmation Treatment Modality:  Psychoeducation Interventions utilized were support Purpose: reinforce self-care  Name: Jacob Adams Date of Birth: [birth year]  MR: [id number]    Level of Participation: when cued Quality of Participation: cooperative Interactions with others: gave feedback Mood/Affect: bored Triggers (if applicable): N/A Cognition: coherent/clear Progress: Minimal Response:  Pt was engaged and responded appropriately to psycho education. Plan: follow-up needed  Patients Problems:  Patient Active Problem List   Diagnosis Date Noted   Hematochezia 06/24/2024   Hemorrhoids 06/24/2024   Polyp of ascending colon 06/24/2024   Lower GI bleed 06/20/2024   Anemia 05/12/2024   Acute lower GI bleeding 05/11/2024   Alcohol use disorder 05/11/2024   GERD (gastroesophageal reflux disease) 05/11/2024   Pain due to total left knee replacement 05/11/2024   Status post total left knee replacement 05/05/2024   Primary osteoarthritis of left knee 05/04/2024   Syncope 03/10/2024   Diarrhea 03/10/2024   Hypotension 01/17/2024   Chronic health problem 01/15/2024   AKI (acute kidney injury) 01/15/2024   History of seizure due to alcohol withdrawal 01/15/2024   Abdominal pain 01/15/2024   Major depressive disorder, 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   Major depressive disorder, recurrent, severe without psychotic behavior (HCC) 01/02/2024   Low back pain 12/31/2023   Lactic acid acidosis 12/31/2023   Low glucose level 12/31/2023   Suicidal behavior 12/31/2023   Chronic colitis 04/25/2022   Alcoholic cirrhosis of liver without ascites (HCC) 04/25/2022   Portal hypertensive gastropathy (HCC) 04/25/2022   History of anemia 04/25/2022    Hyponatremia 01/23/2022   High anion gap metabolic acidosis 01/23/2022   Hyperkalemia 01/23/2022   Fatty liver 01/23/2022   Insomnia 10/04/2020   AMS (altered mental status) 07/23/2020   Sleep apnea    Hypertension    Acute metabolic encephalopathy 01/24/2020   Normocytic anemia 08/15/2019   Hypothyroidism 08/15/2019   HLD (hyperlipidemia) 08/15/2019   Anxiety and depression 08/15/2019   Chronic systolic CHF (congestive heart failure) (HCC) 02/08/2019   Tobacco dependence 02/08/2019   Alcohol withdrawal (HCC) 01/2019   HTN (hypertension) 12/24/2018   Alcohol abuse 01/06/2016   MDD (major depressive disorder), recurrent, severe, 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/03/2015   Alcohol use disorder, severe, dependence (HCC) 11/03/2015   History of GI bleed 10/31/2015   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

## 2024-11-25 NOTE — Discharge Instructions (Addendum)
 [facility] Care Management...  Patient requested discharge today Friday 11/25/2024  Patient declined our assistance with residential treatment and outpatient resources  Patient reported he will reconnect with the [facility] in [place] for inpatient outpatient services.   Patient will follow up with [facility]  for Primary Care and Counseling...   [facility] [address]  [place], [place] [address] [phone]    Patient is 100% service connected  Patient will discharge to home    [address]  [place] [address]   Patient will arrange transportation  Scripts

## 2024-11-25 NOTE — ED Notes (Signed)
 Pt visible in milieu this morning.  Awake and alert.  Ate breakfast and took medications without prompting.  Q 15 min checks in place for safety.  Pt does report that  I am ready to go today  pt asked that Tyrone from SW be informed of his request to discharge.  This was relayed to SW.

## 2024-11-25 NOTE — ED Notes (Signed)
 Patient sleeping with no s/s of distress. No inappropriate behaviors observed or reported at this time. Environment secured. Safety checks in place per facility protocol.

## 2024-11-25 NOTE — Care Management (Signed)
 [facility] Care Management...  Writer met with the client to discuss discharge planning.  Client reports he's ready to discharge home today.  Client reports no additional services are needed and or resources.  Writer informed the client the Provider will be notified of this information.  Provider will make the decision on if the client will be discharged today.  Writer explored with the client how he will get home.  Client reports his wife will come and pick him up.  Writer asked the Client to call his wife and verify this information.  Client reports no further concerns and or questions at this time.  Client reports I'm just ready to go home.

## 2025-01-25 ENCOUNTER — Ambulatory Visit (HOSPITAL_COMMUNITY)
